# Patient Record
Sex: Female | Born: 1980 | Race: White | Hispanic: No | Marital: Single | Smoking: Never smoker
Health system: Southern US, Community
[De-identification: ages and names within clinical notes are randomized; demographics above are authoritative.]

## PROBLEM LIST (undated history)

## (undated) DIAGNOSIS — F419 Anxiety disorder, unspecified: Secondary | ICD-10-CM

## (undated) DIAGNOSIS — I739 Peripheral vascular disease, unspecified: Secondary | ICD-10-CM

## (undated) DIAGNOSIS — N189 Chronic kidney disease, unspecified: Secondary | ICD-10-CM

## (undated) DIAGNOSIS — E119 Type 2 diabetes mellitus without complications: Secondary | ICD-10-CM

## (undated) DIAGNOSIS — M1990 Unspecified osteoarthritis, unspecified site: Secondary | ICD-10-CM

## (undated) HISTORY — PX: VASCULAR SURGERY: SHX849

---

## 2002-07-05 ENCOUNTER — Emergency Department (HOSPITAL_COMMUNITY): Admission: EM | Admit: 2002-07-05 | Discharge: 2002-07-05 | Payer: Self-pay | Admitting: Emergency Medicine

## 2007-10-26 ENCOUNTER — Ambulatory Visit: Payer: Self-pay | Admitting: Internal Medicine

## 2007-12-12 ENCOUNTER — Ambulatory Visit: Payer: Self-pay | Admitting: Internal Medicine

## 2008-02-06 ENCOUNTER — Ambulatory Visit: Payer: Self-pay | Admitting: Internal Medicine

## 2008-04-29 ENCOUNTER — Ambulatory Visit: Payer: Self-pay | Admitting: Emergency Medicine

## 2008-06-24 ENCOUNTER — Ambulatory Visit: Payer: Self-pay | Admitting: Family Medicine

## 2008-07-10 ENCOUNTER — Ambulatory Visit: Payer: Self-pay | Admitting: Internal Medicine

## 2008-11-07 ENCOUNTER — Ambulatory Visit: Payer: Self-pay | Admitting: Internal Medicine

## 2009-04-05 ENCOUNTER — Ambulatory Visit: Payer: Self-pay | Admitting: Internal Medicine

## 2009-08-08 ENCOUNTER — Ambulatory Visit: Payer: Self-pay | Admitting: Family Medicine

## 2011-01-17 ENCOUNTER — Other Ambulatory Visit: Payer: Self-pay | Admitting: Nephrology

## 2011-01-24 ENCOUNTER — Other Ambulatory Visit: Payer: Self-pay | Admitting: Internal Medicine

## 2011-08-06 ENCOUNTER — Ambulatory Visit: Payer: Self-pay | Admitting: Anesthesiology

## 2011-09-01 ENCOUNTER — Ambulatory Visit: Payer: Self-pay | Admitting: Anesthesiology

## 2011-09-30 ENCOUNTER — Ambulatory Visit: Payer: Self-pay | Admitting: Anesthesiology

## 2011-09-30 LAB — ELECTROLYTE PANEL
Anion Gap: 4 — ABNORMAL LOW (ref 7–16)
Potassium: 2.9 mmol/L — ABNORMAL LOW (ref 3.5–5.1)
Sodium: 135 mmol/L — ABNORMAL LOW (ref 136–145)

## 2011-09-30 LAB — HCG, QUANTITATIVE, PREGNANCY: Beta Hcg, Quant.: <1 m[IU]/mL — ABNORMAL LOW

## 2011-10-01 ENCOUNTER — Ambulatory Visit: Payer: Self-pay | Admitting: Vascular Surgery

## 2012-02-12 ENCOUNTER — Other Ambulatory Visit: Payer: Self-pay | Admitting: Nephrology

## 2012-02-12 LAB — POTASSIUM: Potassium: 6.4 mmol/L — ABNORMAL HIGH (ref 3.5–5.1)

## 2012-02-25 ENCOUNTER — Other Ambulatory Visit: Payer: Self-pay

## 2012-02-25 LAB — POTASSIUM: Potassium: 4.9 mmol/L (ref 3.5–5.1)

## 2012-03-03 ENCOUNTER — Other Ambulatory Visit: Payer: Self-pay

## 2012-03-03 LAB — POTASSIUM: Potassium: 5.5 mmol/L — ABNORMAL HIGH (ref 3.5–5.1)

## 2012-10-20 ENCOUNTER — Ambulatory Visit: Payer: Self-pay | Admitting: Vascular Surgery

## 2013-04-24 ENCOUNTER — Other Ambulatory Visit: Payer: Self-pay | Admitting: Nephrology

## 2013-04-24 LAB — BASIC METABOLIC PANEL
Calcium, Total: 8.8 mg/dL (ref 8.5–10.1)
EGFR (African American): 8 — ABNORMAL LOW
EGFR (Non-African Amer.): 7 — ABNORMAL LOW
Glucose: 171 mg/dL — ABNORMAL HIGH (ref 65–99)
Osmolality: 281 (ref 275–301)

## 2013-10-06 ENCOUNTER — Ambulatory Visit: Payer: Self-pay | Admitting: Family Medicine

## 2013-12-28 ENCOUNTER — Ambulatory Visit: Payer: Self-pay | Admitting: Vascular Surgery

## 2013-12-28 LAB — POTASSIUM: Potassium: 3.9 mmol/L (ref 3.5–5.1)

## 2013-12-28 LAB — HCG, QUANTITATIVE, PREGNANCY: Beta Hcg, Quant.: <1 m[IU]/mL — ABNORMAL LOW

## 2014-07-18 ENCOUNTER — Emergency Department: Payer: Self-pay | Admitting: Emergency Medicine

## 2014-07-18 LAB — COMPREHENSIVE METABOLIC PANEL
ALBUMIN: 3.8 g/dL (ref 3.4–5.0)
ALT: 15 U/L
AST: 7 U/L — AB (ref 15–37)
Alkaline Phosphatase: 79 U/L
Anion Gap: 17 — ABNORMAL HIGH (ref 7–16)
BUN: 95 mg/dL — ABNORMAL HIGH (ref 7–18)
Bilirubin,Total: 0.4 mg/dL (ref 0.2–1.0)
CO2: 15 mmol/L — AB (ref 21–32)
CREATININE: 16.49 mg/dL — AB (ref 0.60–1.30)
Calcium, Total: 8.4 mg/dL — ABNORMAL LOW (ref 8.5–10.1)
Chloride: 102 mmol/L (ref 98–107)
EGFR (Non-African Amer.): 3 — ABNORMAL LOW
GFR CALC AF AMER: 3 — AB
Glucose: 240 mg/dL — ABNORMAL HIGH (ref 65–99)
Osmolality: 306 (ref 275–301)
POTASSIUM: 4.6 mmol/L (ref 3.5–5.1)
Sodium: 134 mmol/L — ABNORMAL LOW (ref 136–145)
TOTAL PROTEIN: 6.9 g/dL (ref 6.4–8.2)

## 2014-07-18 LAB — CBC WITH DIFFERENTIAL/PLATELET
Basophil #: 0.1 10*3/uL (ref 0.0–0.1)
Basophil %: 2.5 %
Eosinophil #: 0.1 10*3/uL (ref 0.0–0.7)
Eosinophil %: 2.2 %
HCT: 25.7 % — AB (ref 35.0–47.0)
HGB: 8.3 g/dL — AB (ref 12.0–16.0)
LYMPHS ABS: 1.3 10*3/uL (ref 1.0–3.6)
Lymphocyte %: 27.8 %
MCH: 33.5 pg (ref 26.0–34.0)
MCHC: 32.2 g/dL (ref 32.0–36.0)
MCV: 104 fL — ABNORMAL HIGH (ref 80–100)
MONOS PCT: 5.4 %
Monocyte #: 0.3 x10 3/mm (ref 0.2–0.9)
Neutrophil #: 2.9 10*3/uL (ref 1.4–6.5)
Neutrophil %: 62.1 %
Platelet: 157 10*3/uL (ref 150–440)
RBC: 2.47 10*6/uL — ABNORMAL LOW (ref 3.80–5.20)
RDW: 13.1 % (ref 11.5–14.5)
WBC: 4.7 10*3/uL (ref 3.6–11.0)

## 2014-09-22 ENCOUNTER — Inpatient Hospital Stay: Payer: Self-pay | Admitting: Internal Medicine

## 2014-09-22 LAB — CBC WITH DIFFERENTIAL/PLATELET
Basophil #: 0.2 10*3/uL — ABNORMAL HIGH (ref 0.0–0.1)
Basophil %: 1.8 %
EOS ABS: 0.2 10*3/uL (ref 0.0–0.7)
Eosinophil %: 1.6 %
HCT: 38.8 % (ref 35.0–47.0)
HGB: 12.2 g/dL (ref 12.0–16.0)
LYMPHS ABS: 0.9 10*3/uL — AB (ref 1.0–3.6)
LYMPHS PCT: 9 %
MCH: 33.9 pg (ref 26.0–34.0)
MCHC: 31.4 g/dL — ABNORMAL LOW (ref 32.0–36.0)
MCV: 108 fL — ABNORMAL HIGH (ref 80–100)
MONO ABS: 0.3 x10 3/mm (ref 0.2–0.9)
Monocyte %: 3.1 %
Neutrophil #: 8.4 10*3/uL — ABNORMAL HIGH (ref 1.4–6.5)
Neutrophil %: 84.5 %
PLATELETS: 193 10*3/uL (ref 150–440)
RBC: 3.61 10*6/uL — AB (ref 3.80–5.20)
RDW: 15.3 % — AB (ref 11.5–14.5)
WBC: 9.9 10*3/uL (ref 3.6–11.0)

## 2014-09-22 LAB — BASIC METABOLIC PANEL
ANION GAP: 25 — AB (ref 7–16)
ANION GAP: 15 (ref 7–16)
BUN: 106 mg/dL — ABNORMAL HIGH (ref 7–18)
BUN: 62 mg/dL — ABNORMAL HIGH (ref 7–18)
CO2: 23 mmol/L (ref 21–32)
CREATININE: 19.52 mg/dL — AB (ref 0.60–1.30)
Calcium, Total: 6.8 mg/dL — CL (ref 8.5–10.1)
Calcium, Total: 7.5 mg/dL — ABNORMAL LOW (ref 8.5–10.1)
Chloride: 98 mmol/L (ref 98–107)
Chloride: 100 mmol/L (ref 98–107)
Co2: 12 mmol/L — ABNORMAL LOW (ref 21–32)
Creatinine: 12.2 mg/dL — ABNORMAL HIGH (ref 0.60–1.30)
EGFR (African American): 5 — ABNORMAL LOW
GFR CALC AF AMER: 3 — AB
GFR CALC NON AF AMER: 2 — AB
GFR CALC NON AF AMER: 4 — AB
GLUCOSE: 364 mg/dL — AB (ref 65–99)
GLUCOSE: 178 mg/dL — AB (ref 65–99)
Osmolality: 318 (ref 275–301)
Osmolality: 298 (ref 275–301)
POTASSIUM: 3.7 mmol/L (ref 3.5–5.1)
Potassium: 5.5 mmol/L — ABNORMAL HIGH (ref 3.5–5.1)
Sodium: 135 mmol/L — ABNORMAL LOW (ref 136–145)
Sodium: 138 mmol/L (ref 136–145)

## 2014-09-22 LAB — COMPREHENSIVE METABOLIC PANEL
ALBUMIN: 4 g/dL (ref 3.4–5.0)
ANION GAP: 23 — AB (ref 7–16)
Alkaline Phosphatase: 110 U/L
BILIRUBIN TOTAL: 0.3 mg/dL (ref 0.2–1.0)
BUN: 113 mg/dL — ABNORMAL HIGH (ref 7–18)
CALCIUM: 6.2 mg/dL — AB (ref 8.5–10.1)
Chloride: 98 mmol/L (ref 98–107)
Co2: 9 mmol/L — CL (ref 21–32)
Creatinine: 19.28 mg/dL — ABNORMAL HIGH (ref 0.60–1.30)
EGFR (African American): 3 — ABNORMAL LOW
EGFR (Non-African Amer.): 2 — ABNORMAL LOW
GLUCOSE: 337 mg/dL — AB (ref 65–99)
Osmolality: 310 (ref 275–301)
POTASSIUM: 7 mmol/L — AB (ref 3.5–5.1)
SGOT(AST): 6 U/L — ABNORMAL LOW (ref 15–37)
SGPT (ALT): 17 U/L
Sodium: 130 mmol/L — ABNORMAL LOW (ref 136–145)
Total Protein: 7.3 g/dL (ref 6.4–8.2)

## 2014-09-22 LAB — PHOSPHORUS: PHOSPHORUS: 13.5 mg/dL — AB (ref 2.5–4.9)

## 2014-09-23 LAB — BASIC METABOLIC PANEL
Anion Gap: 15 (ref 7–16)
BUN: 63 mg/dL — AB (ref 7–18)
CHLORIDE: 99 mmol/L (ref 98–107)
CO2: 24 mmol/L (ref 21–32)
CREATININE: 12.87 mg/dL — AB (ref 0.60–1.30)
Calcium, Total: 7.2 mg/dL — ABNORMAL LOW (ref 8.5–10.1)
EGFR (Non-African Amer.): 4 — ABNORMAL LOW
GFR CALC AF AMER: 4 — AB
Glucose: 151 mg/dL — ABNORMAL HIGH (ref 65–99)
Osmolality: 297 (ref 275–301)
POTASSIUM: 4.5 mmol/L (ref 3.5–5.1)
SODIUM: 138 mmol/L (ref 136–145)

## 2014-09-23 LAB — CBC WITH DIFFERENTIAL/PLATELET
Basophil #: 0 10*3/uL (ref 0.0–0.1)
Basophil %: 0.8 %
EOS ABS: 0.1 10*3/uL (ref 0.0–0.7)
Eosinophil %: 2.2 %
HCT: 39.2 % (ref 35.0–47.0)
HGB: 12.6 g/dL (ref 12.0–16.0)
LYMPHS ABS: 1 10*3/uL (ref 1.0–3.6)
Lymphocyte %: 16.5 %
MCH: 33.6 pg (ref 26.0–34.0)
MCHC: 32.2 g/dL (ref 32.0–36.0)
MCV: 104 fL — AB (ref 80–100)
Monocyte #: 0.5 x10 3/mm (ref 0.2–0.9)
Monocyte %: 8.2 %
Neutrophil #: 4.4 10*3/uL (ref 1.4–6.5)
Neutrophil %: 72.3 %
PLATELETS: 185 10*3/uL (ref 150–440)
RBC: 3.76 10*6/uL — ABNORMAL LOW (ref 3.80–5.20)
RDW: 14.7 % — AB (ref 11.5–14.5)
WBC: 6 10*3/uL (ref 3.6–11.0)

## 2014-09-23 LAB — PHOSPHORUS: Phosphorus: 8.9 mg/dL — ABNORMAL HIGH (ref 2.5–4.9)

## 2014-09-24 LAB — BASIC METABOLIC PANEL
Anion Gap: 13 (ref 7–16)
BUN: 29 mg/dL — AB (ref 7–18)
CALCIUM: 7.7 mg/dL — AB (ref 8.5–10.1)
CREATININE: 8.07 mg/dL — AB (ref 0.60–1.30)
Chloride: 95 mmol/L — ABNORMAL LOW (ref 98–107)
Co2: 27 mmol/L (ref 21–32)
EGFR (African American): 7 — ABNORMAL LOW
EGFR (Non-African Amer.): 6 — ABNORMAL LOW
GLUCOSE: 243 mg/dL — AB (ref 65–99)
Osmolality: 284 (ref 275–301)
Potassium: 4.1 mmol/L (ref 3.5–5.1)
SODIUM: 135 mmol/L — AB (ref 136–145)

## 2014-09-24 LAB — CBC WITH DIFFERENTIAL/PLATELET
Basophil #: 0.1 10*3/uL (ref 0.0–0.1)
Basophil %: 1 %
EOS ABS: 0.1 10*3/uL (ref 0.0–0.7)
Eosinophil %: 2.4 %
HCT: 39.7 % (ref 35.0–47.0)
HGB: 12.8 g/dL (ref 12.0–16.0)
LYMPHS PCT: 25.2 %
Lymphocyte #: 1.5 10*3/uL (ref 1.0–3.6)
MCH: 33.5 pg (ref 26.0–34.0)
MCHC: 32.3 g/dL (ref 32.0–36.0)
MCV: 104 fL — AB (ref 80–100)
Monocyte #: 0.8 x10 3/mm (ref 0.2–0.9)
Monocyte %: 12.4 %
NEUTROS PCT: 59 %
Neutrophil #: 3.6 10*3/uL (ref 1.4–6.5)
Platelet: 176 10*3/uL (ref 150–440)
RBC: 3.82 10*6/uL (ref 3.80–5.20)
RDW: 14.9 % — AB (ref 11.5–14.5)
WBC: 6.2 10*3/uL (ref 3.6–11.0)

## 2015-01-16 ENCOUNTER — Other Ambulatory Visit: Payer: Self-pay | Admitting: Vascular Surgery

## 2015-01-16 DIAGNOSIS — N186 End stage renal disease: Secondary | ICD-10-CM

## 2015-01-18 NOTE — Op Note (Signed)
PATIENT NAME:  [name], [name] MR#:  [id number] DATE OF BIRTH:  01/08/1981  DATE OF PROCEDURE:  10/20/2012  PREOPERATIVE DIAGNOSES: 1. End-stage renal disease.  2. Face and neck swelling worrisome for central venous occlusion with right arm AV fistula.  3. Diabetes mellitus.   POSTOPERATIVE DIAGNOSES: 1. End-stage renal disease.  2. Face and neck swelling worrisome for central venous occlusion with right arm AV fistula.  3. Diabetes mellitus.   PROCEDURES PERFORMED: 1. Ultrasound guidance for vascular access to right radiocephalic AV fistula.  2. Right upper extremity fistulogram and central venogram.   SURGEON: Annice Needy, MD  ANESTHESIA: Local with moderate conscious sedation.   ESTIMATED BLOOD LOSS: Minimal.   FLUOROSCOPY TIME: Less than 2 minutes.   CONTRAST USED: 25 mL.   INDICATION FOR PROCEDURE: This is a 34 year old female with end-stage renal disease. Her fistula has worked reasonably well, but she has developed worsening face and head swelling over the past several months. She was catheter-dependent for some time and imaging is performed to evaluate for central venous stenosis or occlusion as well as to evaluate the remainder of the fistula.   DESCRIPTION OF PROCEDURE: The right upper extremity was sterilely prepped and draped and a sterile surgical field was created. The fistula was accessed a few centimeters beyond the anastomosis under direct ultrasound guidance without difficulty with a micropuncture needle and micropuncture wire and sheath were placed. Imaging was performed through the micropuncture sheath. This demonstrated patent fistula anastomosis with compression of the fistula. The remainder of the fistula demonstrated good flow. There was essentially split flow in the mid to upper forearm through the cephalic vein, in a large branch, both of which were patent with good flow and I did not have any difficulty with access or flow rates so the smaller of the two  branches was not embolized or treated today. These were rejoined just above the antecubital fossa. She also had outflow through the deep venous system, in the upper arm. The right subclavian vein, innominate vein and superior vena cava appeared widely patent with good brisk flow present and no central venous occlusion was identified. At this point, there was no role for intervention. I elected to terminate the procedure. The sheath was removed around a 4-0 Monocryl pursestring suture, pressure was held and sterile dressing was placed. The patient tolerated the procedure well and was taken to the recovery room in stable condition.  ____________________________ Annice Needy, MD jsd:sb D: 10/20/2012 12:27:34 ET T: 10/20/2012 12:39:11 ET JOB#: [id number]  cc: Annice Needy, MD, <Dictator> Molli Barrows, MD Annice Needy MD ELECTRONICALLY SIGNED 10/24/2012 9:12

## 2015-01-19 NOTE — Consult Note (Signed)
Brief Consult Note: Comments: Psychiatry: Consult received. PAtient appears to have left AMA.  Electronic Signatures: Clapacs, Jackquline Denmark (MD)  (Signed 28-Dec-15 16:08)  Authored: Brief Consult Note   Last Updated: 28-Dec-15 16:08 by Audery Amel (MD)

## 2015-01-19 NOTE — Op Note (Signed)
PATIENT NAME:  [name], [name] MR#:  [id number] DATE OF BIRTH:  02-25-1981  DATE OF PROCEDURE:  12/28/2013  PREOPERATIVE DIAGNOSES:  1.  End-stage renal disease.  2.  Diabetes.  3.  Poor function of right arteriovenous fistula.   POSTOPERATIVE DIAGNOSES:  1.  End-stage renal disease.  2.  Diabetes.  3.  Poor function of right arteriovenous fistula.   PROCEDURE: 1.  Ultrasound guidance for vascular access, right radiocephalic AV fistula.  2.  Right extremity fistulogram and central venogram.  3.  Percutaneous transluminal angioplasty of right mid forearm cephalic vein with 7 mm diameter angioplasty balloon.  4.  A 6 mm Viabahn stent post dilated with a 7 mm balloon for greater than 50% residual stenosis and thrombus after angioplasty.    SURGEON: Annice Needy, M.D.   ANESTHESIA: Local with moderate conscious sedation.   ESTIMATED BLOOD LOSS: 25 mL   CONTRAST USED:  45 mL Visipaque.   INDICATION FOR PROCEDURE: A 34 year old white female who has been on dialysis for several years. Her right radiocephalic AV fistula has now developed poor flow and noninvasive study showed a short segment occlusion in the mid forearm cephalic vein and is now being drained by collaterals. We are attempting to salvage this today. Risks and benefits were discussed. Informed consent was obtained.   DESCRIPTION OF PROCEDURE: The patient is brought to the vascular suite. The right upper extremity was sterilely prepped and draped and a sterile surgical field was created. The fistula was accessed with a mild amount of difficulty. Just beyond the arterial anastomosis with a micropuncture needle, micropuncture wire and sheath were then placed. We upsized to a 6-French sheath. Imaging did show a short segment occlusion with drainage through a collateral. The upper arm cephalic vein was normal and central venous circulation was normal. The patient was heparinized. A stiff angled Glidewire and Kumpe catheter used to  cross the lesion without difficulty and I confirmed intraluminal flow in the cephalic vein just below the antecubital fossa. Then replaced a Magic Torque wire. I treated this area with a 7 mm diameter angioplasty balloon which resulted in improvement of flow but about a 70% to 80% residual flow-limiting thrombus was seen. I elected to cover this area with a 6 mm diameter x 2.5 cm with a Viabahn stent post dilated with 7 mm balloon with an excellent angiographic completion result and no hemodynamically significant residual stenosis. At this point, I elected to terminate the procedure. The sheath was removed, 4-0 Monocryl purse suture was placed, and pressure was held. Sterile dressing was placed. The patient tolerated the procedure well and was taken to the recovery room in stable condition.    ____________________________ Annice Needy, MD jsd:tc D: 12/28/2013 15:06:20 ET T: 12/28/2013 18:54:37 ET JOB#: [id number]  cc: Annice Needy, MD, <Dictator> Annice Needy MD ELECTRONICALLY SIGNED 01/01/2014 9:52

## 2015-01-19 NOTE — Consult Note (Signed)
PATIENT NAME:  [name], [name] MR#:  [id number] DATE OF BIRTH:  [birth year]  DATE OF CONSULTATION:  09/22/2014   CONSULTING PHYSICIAN:  Scot Jun, MD  HISTORY OF PRESENT ILLNESS:  The patient is a 34 year old white female who has had severe medical problems. She has end stage kidney disease and is on dialysis. She has had diabetes for 20 years since age 44. She has not had dialysis in over a week and she comes in with severe metabolic disorders.   The patient says she has been vomiting 3 times a day for the last week. She notices a black material in her vomit.  She does not take iron pills. She does, however take ibuprofen.  Her last endoscopy was 2 years ago.  She has had 2 or 3 endoscopies and 3 or 4 colonoscopies.  She has significant diarrhea, especially after eating. She will have diarrhea and have multiple bowel movements to the point of the draining everything out.  She was recently started on the octreotide. She has had 2 shots.  She is overdue now for a third shot in about three weeks.   The patient has multiple joints causing pain in her back, hips and legs, and she takes ibuprofen for that.  She has been told that she may have gastroparesis.  She has been on Reglan in the past, but not now. She has not had menstrual periods for years, and she is on no birth control.  She tends to run a low blood pressure.   REVIEW OF SYSTEMS:  No known heart disease. No known respiratory disease, asthma, wheezing, emphysema.  She has never had a stroke, does not smoke, does not use street drugs. She has been vomiting 3 times a day.   MEDICAL PROBLEMS: Include eating disorder, depression, anorexia, hypothyroidism, irritable bowel syndrome, anemia of chronic kidney disease, secondary hyperparathyroidism. She has a right upper extremity AV fistula.   She dialyzes Monday, Wednesday, and Friday. Followed at the [facility].   ALLERGIES: No known drug allergies.   MEDICATIONS AT HOME:   Include sodium bicarbonate 1300 mg p.o. b.i.d., Renvela 800 mg p.o. t.i.d., Ropinirole 1 tablet p.o. t.i.d., promethazine 25 mg p.o. every 6 hours p.r.n. Pancrelipase 25,000 units 4 capsules with food 3 times a day, Zofran 4 mg every 6 hours p.r.n., multivitamins 1 tablet a day, midodrine 5 mg t.i.d., Levemir insulin dose uncertain, hyoscyamine 0.125 mg daily, sliding scale insulin glucagon 1 mg IM for a very low blood sugar, Fosrenol  1000 mg p.o. t.i.d., fludrocortisone 1 tablet p.o. b.i.d., Colestipol 1 gram q.i.d., dronabinol 2.5 mg p.o. t.i.d.   PHYSICAL EXAMINATION:  GENERAL: Young white female looks to be feeling bad.  VITAL SIGNS: Temperature 97.8, pulse 102, respirations 10, blood pressure 138/94, pulse oximetry 96% on room air.  The patient has multicolored hair. She said it was 10 different colors.  HEENT: Sclerae nonicteric. Conjunctivae negative. Tongue negative.  HEAD: Atraumatic.  NECK: Trachea in the midline.  CHEST: Clear.  HEART: No murmurs or gallops that I can hear.  ABDOMEN: Bowel sounds are significantly diminished. No palpable organomegaly. No significant tenderness.  EXTREMITIES: Dialysis shunt in the right arm   LABORATORY DATA: Glucose 409, BUN 113, creatinine 19.3, sodium 130, potassium 7, chloride 98, CO2 of 9, calcium 6.2, total protein 7.3, albumin 4, bilirubin 0.3, alkaline phosphatase 110, SGOT 6, SGPT 17. White count is 9.9, hemoglobin 12.2, platelet count 193,000, MCV 103, pH 7.09, pO2 of 110, bicarbonate of 10.  ASSESSMENT: The patient's gastrointestinal problem probably is repetitive vomiting, producing mucosal damage which causes repetitive vomiting, which causes mucosal damage.  Also her diabetes may play a role in gastroparesis and also may be the cause for her diarrhea creating a secretory diarrhea of significant proportions. To treat her stomach, I would recommend around the clock 4 mg Zofran every hours.  I would give IV Protonix b.i.d. and Phenergan as a  back up to the Protonix.  I would give her ice chips only at this time and slowly expand the clear liquids as tolerated.  Dr. Bluford Kaufmann will be on call tomorrow and round on her. 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       ____________________________ Scot Jun, MD rte:DT D: 09/22/2014 15:53:18 ET T: 09/22/2014 16:10:52 ET JOB#: [id number]  cc: Scot Jun, MD, <Dictator> Munsoor Lizabeth Leyden, MD Katha Hamming, MD  Scot Jun MD ELECTRONICALLY SIGNED 09/26/2014 13:52

## 2015-01-19 NOTE — Consult Note (Signed)
Chief Complaint:  Subjective/Chief Complaint Covering for Dr. Mechele Collin. Pt continues to vomit with coffee ground emesis although patient on zofran RTC and reglan IV and protonix IV. Hgb remains stable. According to family, no previous hematemesis when she had EGD's done in the past at [facility]. Recalls being on reglan before without much relief.   VITAL SIGNS/ANCILLARY NOTES: **Vital Signs.:   27-Dec-15 12:00  Vital Signs Type Routine   Brief Assessment:  GEN Appears sick   Cardiac Regular   Respiratory clear BS   Gastrointestinal tender in epigastrum   Assessment/Plan:  Assessment/Plan:  Assessment Poorly controlled diabetes. Renal failure. Possible gastroparesis. UGI bleeding.   Plan Continue reglan/protonix/zofran. If no significant relief soon, consider repeating EGD. Will discuss with Dr. Mechele Collin.   Electronic Signatures: Lutricia Feil (MD)  (Signed 27-Dec-15 11:05)  Authored: Chief Complaint, VITAL SIGNS/ANCILLARY NOTES, Brief Assessment, Assessment/Plan   Last Updated: 27-Dec-15 11:05 by Lutricia Feil (MD)

## 2015-01-23 NOTE — Discharge Summary (Signed)
PATIENT NAME:  [name], Wu MR#:  [id number] DATE OF BIRTH:  11/23/1980  DATE OF ADMISSION:  09/22/2014 DATE OF DISCHARGE:  09/24/2014  ADMITTING PHYSICIAN: Zenda Alpers, MD   The patient left AMA on the early hours of 09/24/2014 before seen by a physician.   PRIMARY CARE PHYSICIAN: [name] Housekeeper, MD  PRIMARY NEPHROLOGIST: [facility].   CONSULTATIONS IN THE HOSPITAL:  1.  GI consultation by Scot Jun, MD 2.  Nephrology consultation with Daiva Nakayama, MD.    FINAL DIAGNOSES:  1.  Acute on chronic severe gastroparesis with nausea, vomiting.  2.  Diabetic ketoacidosis with hypoglycemic episodes.  3.  Type 1 diabetes mellitus.  4.  End-stage renal disease, on hemodialysis.  5.  Hematemesis from repeated vomiting.  6.  Depression. 7.  Severe metabolic acidosis.  LABORATORIES AND IMAGING STUDIES PRIOR TO DISCHARGE: WBC 6.0, hemoglobin 12.6, hematocrit 39.2, platelet count 185,000.   Sodium 138, potassium 4.5, chloride 99, bicarbonate 24, BUN 63, creatinine 12.87, glucose 151, calcium of 7.2. Creatinine on admission was 19.28 with potassium of 7.0, calcium of 6.1, bicarbonate of 9.   BRIEF HOSPITAL COURSE: [name] is a 34 year old young Caucasian female with past medical history significant for type 1 diabetes mellitus with gastroparesis, chronic nausea and vomiting, history of end-stage renal disease on hemodialysis, who presents to the hospital secondary to worsening nausea, vomiting and diarrhea.  1.  Acute on chronic nausea and vomiting from missing dialysis for almost 8 days, probably uremia causing most of symptoms. She has poor p.o. intake anyways from her chronic nausea and vomiting. She also developed hematemesis after multiple vomiting episodes, could be Mallory-Weiss versus superficial capillary rupture from repeated vomiting. However, hematemesis has resolved. She was put on Protonix infusion, seen by GI. Hemoglobin was monitored, no  significant drop, no need for any transfusion in the hospital. She was on Reglan, Zofran and Phenergan here in the hospital. Once her symptoms have improved, the patient has actually left against medical advice and said she will follow up for outpatient dialysis.  2.  End-stage renal disease with metabolic acidosis and hyperkalemia, missing hemodialysis. Received dialysis on December 26 as well as 27th. Seen by nephrology. Creatinine has improved from 19 to 12 with potassium improving up to 4.1. Seen by nephrology in the hospital. Again, the patient left AMA.   ADDITIONAL TIME SPENT: Thirty-five minutes.    ____________________________ Enid Baas, MD rk:TT D: 09/25/2014 14:05:00 ET T: 09/25/2014 15:33:28 ET JOB#: [id number]  cc: Enid Baas, MD, <Dictator> Enid Baas MD ELECTRONICALLY SIGNED 10/15/2014 16:13

## 2015-01-23 NOTE — Consult Note (Signed)
PATIENT NAME:  [name], ROCCHIO MR#:  [id number] DATE OF BIRTH:  [birth year]  DATE OF CONSULTATION:  09/22/2014  REFERRING PHYSICIAN:  Katha Hamming, MD CONSULTING PHYSICIAN:  Ysenia Filice Lizabeth Leyden, MD  REASON FOR CONSULTATION: Evaluation and management of end-stage renal disease on a hemodialysis patient with severe hyperkalemia and metabolic acidosis.   HISTORY OF PRESENT ILLNESS: The patient is a very pleasant 34 year old Caucasian female with a past medical history of end-stage renal disease on hemodialysis at [facility], followed by [facility], history of eating disorder, depression, anorexia, hypothyroidism, irritable bowel syndrome, anemia of chronic kidney disease, secondary hyperparathyroidism, who presented to [facility] with nausea, vomiting, and not having had dialysis in over 1 week. The patient reports to me that she has been having ongoing nausea and vomiting for at least the past week. She has had some episodes of diarrhea; however, she has known underlying irritable bowel syndrome, which has been diarrhea dominant in the past. She has not gone to dialysis since last Friday. She has severe metabolic derangements upon presentation. BUN was noted as being 113 with a creatinine of 19.2. Potassium was high at 7.0 and serum bicarbonate was quite low at 9. ABG was performed, which showed a pH of 7.09, pCO2 of 33. She was admitted to the critical care unit and placed on an insulin drip. We have been asked to see her for reinitiating dialysis.   PAST MEDICAL HISTORY:  1.  End-stage renal disease on hemodialysis Monday/Wednesday/ Friday, followed at the [facility] and followed by [facility].  2.  Anemia of chronic kidney disease.  3.  Secondary hyperparathyroidism.  4.  Hypertension.  5.  Irritable bowel syndrome, diarrhea predominant.  6.  Hypothyroidism.  7.  Anorexia.  8.  History of eating disorder.  9.  Diabetes melitis type  1.  10.  Right upper extremity AV fistula.   ALLERGIES: No known drug allergies.   HOME MEDICATIONS INCLUDE:  1.  Sodium bicarbonate 1300 mg p.o. b.i.d.  2.  Renvela 800 mg p.o. t.i.d.  3.  Ropinirole 1 tablet p.o. t.i.d.  4.  Promethazine 25 mg p.o. every 6 hours p.r.n.  5.  Pancrelipase 25,000 units 4 capsules p.o. t.i.d.  6.  Zofran 4 mg p.o. every 6 hours p.r.n.  7.  Multivitamin 1 tablet p.o. daily.  8.  Midodrine 5 mg p.o. t.i.d.  9.  Levemir insulin.  10.  Hyoscyamine 0.125 mg p.o. daily.  11.  Sliding scale insulin.  12.  Glucagon 1 mg intramuscular as needed for a low blood sugar.  13.  Fosrenol 1000 mg p.o. t.i.d.  14.  Fludrocortisone 1 tablet p.o. b.i.d.  15.  Colestipol 1 g p.o. q.i.d.  16.  Dronabinol 2.5 mg p.o. t.i.d.   SOCIAL HISTORY: The patient resides with her parents in [place]. She has no children. She is on disability. She denies tobacco, alcohol, or illicit drug use.   FAMILY HISTORY: The patient reports her mother is alive and well. She is unaware of any medical issues with her mother.   REVIEW OF SYSTEMS:  CONSTITUTIONAL: The patient denies fevers, chills, or recent weight loss. She actually reports some recent weight gain.  EYES: Denies diplopia or blurry vision.  HEENT: Denies headaches or hearing loss. Denies epistaxis.  CARDIOVASCULAR: Currently denies chest pain or palpitations.  RESPIRATORY: Denies cough, shortness of breath, or hemoptysis.  GASTROINTESTINAL: Endorses nausea, vomiting, diarrhea and has history of irritable bowel syndrome.  GENITOURINARY: Denies  frequency, urgency, or dysuria.  MUSCULOSKELETAL: Denies joint pain, swelling or redness.  INTEGUMENTARY: Denies skin rashes or lesions.  NEUROLOGIC: Denies focal numbness or weakness. Has history of restless leg syndrome.  PSYCHIATRIC: Has history of depression.  ENDOCRINE: Denies polyuria or polydipsia. Has history of diabetes mellitus type 1.  HEMATOLOGIC AND LYMPHATIC: Denies  easy bruisability, bleeding or swollen lymph nodes.  ALLERGY AND IMMUNOLOGIC: Denies seasonal allergies or history of immunodeficiency.   PHYSICAL EXAMINATION:  VITAL SIGNS: Temperature 97.8, pulse 102, respirations 10, blood pressure 138/94.  GENERAL: A well-developed, well-nourished Caucasian female who appears her stated age, currently in no acute distress.  HEENT: Normocephalic, atraumatic. Extraocular movements are intact. Pupils equal and reactive to light. No scleral icterus. Conjunctivae are pink. No epistaxis noted. Gross hearing intact. Oral mucosa moist.  NECK: Supple and without JVD or lymphadenopathy.  LUNGS: Clear to auscultation bilaterally, with normal respiratory effort.  HEART: S1, S2. Regular rate and rhythm. No murmurs, rubs, or gallops appreciated.  ABDOMEN: Soft, nontender, nondistended. Bowel sounds positive. No rebound or guarding. No gross organomegaly appreciated.  EXTREMITIES: No clubbing, cyanosis, or edema. NEUROLOGIC: The patient is awake, alert, and oriented to time, person, and place. Strength is 5/5 in both upper and lower extremities. Sensation is intact.  GENITOURINARY: No suprapubic tenderness is noted at this time.  SKIN: Warm and dry. No rashes noted.  MUSCULOSKELETAL: No joint redness, swelling or tenderness appreciated.  PSYCHIATRIC: The patient with an appropriate affect, and appears to have good insight into her current illness.   LABORATORY DATA: Sodium 130, potassium 7.0, chloride 98, CO2 of 29, BUN 113, creatinine 19.2, glucose 337, total protein 7.3, albumin 4.0, total bilirubin 0.3, alkaline phosphatase 110, AST 6, ALT 17. CBC shows WBC 9.9, hemoglobin 12.2, hematocrit 38, platelets 193,000. ABG shows pH of 7.09, pCO2 of 33, pO2 of 110.   IMPRESSION: This is a 34 year old Caucasian female with a past medical history of end-stage renal disease on hemodialysis Monday/Wednesday/Friday, followed at [facility], and followed by [facility]; hypothyroidism, irritable bowel syndrome with diarrhea symptoms prominent, anorexia, depression, diabetes mellitus type 1, anemia of chronic kidney disease, secondary hyperparathyroidism, right upper extremity AV fistula, who presented to [facility] with nausea, vomiting and having missed dialysis for approximately 8 days.  1.  End-stage renal disease on hemodialysis Monday/Wednesday/ Friday. The patient is clearly in need of hemodialysis at present, given hyperkalemia at present. Therefore, we will start the patient on dialysis today. However, given the long duration without dialysis, we will need to be gentle with her dialysis treatment, and therefore, the patient will undergo dialysis for 2 hours with a blood flow rate of 200 and dialysate flow rate of 400. No ultrafiltration to be performed at present. We will likely start the patient on dialysis tomorrow as well.  2.  Severe hyperkalemia. Serum potassium noted as being 7.0. Acidosis contributing to this. As she receives insulin drip, much of the potassium will be shifted to the intracellular space. We will dialyze the patient against a 1K bath today.  3.  Anemia of chronic kidney disease. Hemoglobin is actually high now, but she may be volume contracted. We will hold off on administering Epogen at present and follow CBC.  4.  Secondary hyperparathyroidism. Hold off on administering binders for now. Check parathyroid hormone as well as phosphorus today.  5.  Diabetic ketoacidosis. The patient is currently on insulin drip. We would recommend monitoring of serum electrolytes.   I would like  to thank Dr. Luberta Mutter for this kind referral.    ____________________________ Lennox Pippins, MD mnl:MT D: 09/22/2014 14:59:12 ET T: 09/22/2014 15:18:05 ET JOB#: [id number]  cc: Lennox Pippins, MD, <Dictator> Ria Comment Zymeir Salminen MD ELECTRONICALLY SIGNED 10/18/2014 9:40

## 2015-01-23 NOTE — H&P (Signed)
PATIENT NAME:  [name], [name] MR#:  [id number] DATE OF BIRTH:  [birth year]  DATE OF ADMISSION:  09/22/2014  FAMILY PHYSICIAN:  [name] Housekeeper, MD  CHIEF COMPLAINT: Nausea, vomiting, diarrhea.   HISTORY OF PRESENT ILLNESS: This patient is a 34 year old female patient with history of type 2 diabetes mellitus, hypertension, ESRD on hemodialysis, comes in because of vomiting for 3 days associated with coffee-ground vomiting since last night. The patient also has generalized abdominal pain for 3 days. She missed hemodialysis, 3 sessions, because of her nausea, vomiting, diarrhea.  She comes in because of those complaints, found to have severe hyperkalemia with potassium of 7 and bicarbonate of 9.  The patient was found to be in diabetic ketoacidosis.  She received a dose of bicarbonate IV push along with insulin and dextrose and I spoke with Dr. Mady Haagensen to arrange for emergency hemodialysis.  She has diarrhea for 11 years and according to the husband she had all the work-up at [facility] and the work-up has been negative and they could not find the reason for her diarrhea.  The patient was noted to have coffee-ground vomiting since last night.   PAST MEDICAL HISTORY: Significant for type 2 diabetes mellitus and history of ESRD on hemodialysis for the past 3 years and history of low blood pressure.  ALLERGIES: No known allergies.   SOCIAL HISTORY: The patient does not smoke or drink and lives with husband.   FAMILY HISTORY: No hypertension or diabetes.   PAST SURGICAL HISTORY: Significant for dialysis shunt placement.  MEDICATIONS:  Percocet 5/325 once a day as needed for pain, Colestipol 1 gram p.o. 4 times a day as needed, fludrocortisone 0.1 mg p.o. b.i.d.  ., Humalog or Quick pen as needed.  Levemir 2 units subcutaneous b.i.d., Midodrine 5 mg p.o. t.i.d. Zofran 4 mg every 6 to 8 hours as needed. Pancrelipase 4 capsules t.i.d., Requip 1 mg p.o. t.i.d., sodium bicarbonate 650 mg 2 tablets  p.o. b.i.d., Sevelamer 800 mg p.o. t.i.d.,, CONSTITUTIONAL: The patient feels tired with diffuse body aches.  EYES: No blurred vision.  ENT: No tinnitus. No ear pain. No epistaxis. No difficulty swallowing.  RESPIRATORY: No cough. No wheezing.  CARDIOVASCULAR: No chest pain, No orthopnea, does have a facial edema. No palpitations.  GASTROINTESTINAL: Does have nausea or vomiting and coffee-ground vomiting since last night and diarrhea. Multiple episodes and diarrhea is chronic and going on for 11 years.  GENITOURINARY: She is on dialysis.  ENDOCRINE: The patient has diabetes since she has been 34 years old.  HEMATOLOGIC: No anemia.  INTEGUMENTARY: No skin rashes.  MUSCULOSKELETAL: No joint pain.  NEUROLOGIC: No numbness or weakness or dysarthria. PSYCHIATRIC:  No anxiety or insomnia.   PHYSICAL EXAMINATION:  VITAL SIGNS: Temporary 97.8, heart rate 102, blood pressure 138/95, sats 98%  on room air. GENERAL:  Alert, awake and oriented 34 year old female in slight distress because of generalized body pains.  HEAD: Atraumatic, normocephalic.  EYES: Pupils equally reacting to light. Extraocular movements are intact. No conjunctivitis. Hearing is intact.  ENT: No tympanic membrane congestion. Mucosae is dry and dentition is good.  NECK: Thyroid is not enlarged. No JVD. No carotid bruit. Normal range of motion, no lymphadenopathy.  RESPIRATORY: Bilaterally clear to auscultation. No wheeze. No rales. Not using accessory muscles of respiration.  CARDIOVASCULAR: S1, S2 regular tachycardic.  PMI not displaced.  Have good pedal pulse, femoral pulse, does not have pedal edema.  ABDOMEN: Generalized abdominal tenderness present. No rebound tenderness. No organomegaly. No  hernias.  MUSCULOSKELETAL: Strength 5/5 in upper and lower extremities. dtr 2+  . PSYCHIATRIC: Motor and affect are within normal limits.   LABORATORY DATA: WBC 9.9, hemoglobin 12.2, hematocrit 38.8, platelets 193,000.     ELECTROLYTES: Sodium is 130, potassium 7, chloride 98, bicarb 9, BUN is 113, creatinine 19.28, glucose 337 and anion gap is 23.  EKG shows no tall T-waves with normal sinus, 89 beats per minute.  Chest x-ray is not done.  I ordered an ABG as well.   ASSESSMENT AND PLAN:   1.  The patient is a 34 year old female patient with severe hyperkalemia secondary to missed hemodialysis sessions. Admit her to Intensive Care Unit. She received insulin with dextrose, Kayexalate and bicarbonate push.  I spoke with Dr.Munsoor Lateef <<e and we are going to arrange for emergency hemodialysis. 2.  Gastrointestinal bleed with nausea, vomiting and coffee-ground vomiting, started on Protonix drip. She is stable hemodynamically but I would get a gastrointestinal consult. The patient says that she takes sometimes ibuprofen, so we will with await consultation, start the Protonix drip.  3.  Severe metabolic acidosis with diabetic ketoacidosis and anion gap of 23, started on insulin drip and started on bicarbonate drip.  Check Chem-7 every 6 hours and see how she does. Marland Kitchen   TIME SPENT: More than 55 minutes on critical care.     ____________________________ Katha Hamming, MD sk:DT D: 09/22/2014 14:18:27 ET T: 09/22/2014 15:04:18 ET JOB#: [id number]  cc: Katha Hamming, MD, <Dictator> [name] Housekeeper, MD Katha Hamming MD ELECTRONICALLY SIGNED 10/19/2014 21:17

## 2015-01-28 ENCOUNTER — Ambulatory Visit: Admission: RE | Admit: 2015-01-28 | Payer: Self-pay | Source: Ambulatory Visit

## 2015-02-14 ENCOUNTER — Encounter
Admission: RE | Disposition: A | Discharge status: Home / Self Care | Payer: Medicare Other | Source: Ambulatory Visit | Attending: Vascular Surgery

## 2015-02-14 ENCOUNTER — Encounter: Payer: Self-pay | Admitting: *Deleted

## 2015-02-14 ENCOUNTER — Ambulatory Visit
Admission: RE | Admit: 2015-02-14 | Discharge: 2015-02-14 | Disposition: A | Discharge status: Home / Self Care | Payer: Medicare Other | Source: Ambulatory Visit | Attending: Vascular Surgery | Admitting: Vascular Surgery

## 2015-02-14 DIAGNOSIS — Z992 Dependence on renal dialysis: Secondary | ICD-10-CM | POA: Insufficient documentation

## 2015-02-14 DIAGNOSIS — Z79899 Other long term (current) drug therapy: Secondary | ICD-10-CM | POA: Insufficient documentation

## 2015-02-14 DIAGNOSIS — T82858A Stenosis of vascular prosthetic devices, implants and grafts, initial encounter: Secondary | ICD-10-CM | POA: Diagnosis present

## 2015-02-14 DIAGNOSIS — R634 Abnormal weight loss: Secondary | ICD-10-CM | POA: Insufficient documentation

## 2015-02-14 DIAGNOSIS — M1990 Unspecified osteoarthritis, unspecified site: Secondary | ICD-10-CM | POA: Insufficient documentation

## 2015-02-14 DIAGNOSIS — N186 End stage renal disease: Secondary | ICD-10-CM | POA: Diagnosis not present

## 2015-02-14 HISTORY — PX: PERIPHERAL VASCULAR CATHETERIZATION: SHX172C

## 2015-02-14 HISTORY — DX: Unspecified osteoarthritis, unspecified site: M19.90

## 2015-02-14 SURGERY — A/V SHUNTOGRAM/FISTULAGRAM
Anesthesia: Moderate Sedation

## 2015-02-14 MED ORDER — SODIUM CHLORIDE 0.9 % IV SOLN: INTRAVENOUS | Status: DC

## 2015-02-14 MED ORDER — HEPARIN SODIUM (PORCINE) 1000 UNIT/ML IJ SOLN
INTRAMUSCULAR | Status: AC
  Filled 2015-02-14: qty 1

## 2015-02-14 MED ORDER — MIDAZOLAM HCL 2 MG/2ML IJ SOLN
INTRAMUSCULAR | Status: DC | PRN
  Administered 2015-02-14: 2 mg via INTRAVENOUS
  Administered 2015-02-14: 1 mg via INTRAVENOUS
  Administered 2015-02-14: 2 mg via INTRAVENOUS

## 2015-02-14 MED ORDER — MIDAZOLAM HCL 5 MG/5ML IJ SOLN
INTRAMUSCULAR | Status: AC
  Filled 2015-02-14: qty 5

## 2015-02-14 MED ORDER — ONDANSETRON HCL 4 MG/2ML IJ SOLN: 4.0000 mg | INTRAMUSCULAR | Status: DC | PRN

## 2015-02-14 MED ORDER — LIDOCAINE-EPINEPHRINE (PF) 1 %-1:200000 IJ SOLN
INTRAMUSCULAR | Status: AC
  Filled 2015-02-14: qty 30

## 2015-02-14 MED ORDER — DEXTROSE 50 % IV SOLN: 0.5000 | Freq: Once | INTRAVENOUS | Status: DC | PRN

## 2015-02-14 MED ORDER — HEPARIN SODIUM (PORCINE) 1000 UNIT/ML IJ SOLN
INTRAMUSCULAR | Status: DC | PRN
  Administered 2015-02-14: 3000 [IU] via INTRAVENOUS

## 2015-02-14 MED ORDER — FENTANYL CITRATE (PF) 100 MCG/2ML IJ SOLN
INTRAMUSCULAR | Status: AC
  Filled 2015-02-14: qty 2

## 2015-02-14 MED ORDER — FENTANYL CITRATE (PF) 100 MCG/2ML IJ SOLN
INTRAMUSCULAR | Status: DC | PRN
  Administered 2015-02-14 (×2): 50 ug via INTRAVENOUS

## 2015-02-14 MED ORDER — ATROPINE SULFATE 0.1 MG/ML IJ SOLN: 0.5000 mg | Freq: Once | INTRAMUSCULAR | Status: DC | PRN

## 2015-02-14 MED ORDER — LIDOCAINE HCL (PF) 1 % IJ SOLN
INTRAMUSCULAR | Status: DC | PRN
  Administered 2015-02-14: 10 mL

## 2015-02-14 MED ORDER — HEPARIN (PORCINE) IN NACL 2-0.9 UNIT/ML-% IJ SOLN
INTRAMUSCULAR | Status: AC
Start: 2015-02-14 — End: 2015-02-14
  Filled 2015-02-14: qty 1000

## 2015-02-14 MED ORDER — CEFAZOLIN SODIUM 1-5 GM-% IV SOLN
1.0000 g | Freq: Once | INTRAVENOUS | Status: AC
  Administered 2015-02-14: 1 g via INTRAVENOUS

## 2015-02-14 MED ORDER — HYDROMORPHONE HCL 1 MG/ML IJ SOLN: 1.0000 mg | INTRAMUSCULAR | Status: DC | PRN

## 2015-02-14 SURGICAL SUPPLY — 12 items
BALLN LUTONIX DCB 7X60X130 (BALLOONS) ×3
BALLOON LUTONIX DCB 7X60X130 (BALLOONS) ×1 IMPLANT
CATH KUMPE (CATHETERS) ×2
CATH SLIP 5FR 0.38 X 40 KMP (CATHETERS) ×1 IMPLANT
DEVICE PRESTO INFLATION (MISCELLANEOUS) ×3 IMPLANT
DRAPE BRACHIAL (DRAPES) ×3 IMPLANT
GLIDEWIRE STIFF .35X180X3 HYDR (WIRE) ×3 IMPLANT
KIT 5FR STIFF NT/TG (MISCELLANEOUS) ×3 IMPLANT
PACK ANGIOGRAPHY (CUSTOM PROCEDURE TRAY) ×3 IMPLANT
SHEATH BRITE TIP 6FRX5.5 (SHEATH) ×3 IMPLANT
TOWEL OR 17X26 4PK STRL BLUE (TOWEL DISPOSABLE) ×3 IMPLANT
WIRE MAGIC TORQUE 260C (WIRE) ×3 IMPLANT

## 2015-02-14 NOTE — Op Note (Signed)
[facility]    OPERATIVE NOTE   PROCEDURE: 1.   Right radiocephalic arteriovenous fistula cannulation under ultrasound guidance in a retrograde fashion near the antecubital fossa 2.   Right arm fistulagram including central venogram 3.   Percutaneous transluminal angioplasty of mid to distal forearm cephalic vein for in-stent stenosis/occlusion with 7 mm diameter Lutonix drug-coated angioplasty   PRE-OPERATIVE DIAGNOSIS: 1. ESRD 2. Poorly functional right radiocephalic AVF  POST-OPERATIVE DIAGNOSIS: same as above   SURGEON: Leotis Pain, MD  ANESTHESIA: local with MCS  ESTIMATED BLOOD LOSS: Minimal  FINDING(S): 1. Occlusion of stent in the mid to distal forearm cephalic vein  SPECIMEN(S):  None  CONTRAST: 25 cc  INDICATIONS: [name] is a 34 y.o. female who presents with malfunctioning  right arm radiocephalic arteriovenous fistula.  The patient is scheduled for  right arm fistulagram.  The patient is aware the risks include but are not limited to: bleeding, infection, thrombosis of the cannulated access, and possible anaphylactic reaction to the contrast.  The patient is aware of the risks of the procedure and elects to proceed forward.  DESCRIPTION: After full informed written consent was obtained, the patient was brought back to the angiography suite and placed supine upon the angiography table.  The patient was connected to monitoring equipment.  The  right arm was prepped and draped in the standard fashion for a percutaneous access intervention.  Under ultrasound guidance, the  right radiocephalic arteriovenous fistula was cannulated with a micropuncture needle under direct ultrasound guidance in a retrograde fashion and the cephalic vein just below the antecubital fossa and a permanent image was performed.  The microwire was advanced into the fistula and the needle was exchanged for the a microsheath.  I then upsized to a 6 Fr Sheath and imaging  was performed.  Hand injections were completed to image the access including the central venous system. This demonstrated occlusion of the previous stent in the cephalic vein in the mid to distal forearm.  Based on the images, this patient will need treatment of the stent to restore patency of the fistula and make it functional. I then gave the patient 3000 units of intravenous heparin.  I then crossed the stenosis with a glide wire and Kumpe catheter. Kumpe catheter was placed at the anastomosis and imaging was performed. I then exchanged for a Magic torque wire.  Based on the imaging, a 7 mm x 6 cm  Lutonix drug-coated angioplasty balloon was selected.  The balloon was centered around the in-stent stenosis and inflated to 14 ATM for 1 minute(s).  On completion imaging, a less than 10 % residual stenosis was present.     Based on the completion imaging, no further intervention is necessary.  The wire and balloon were removed from the sheath.  A 4-0 Monocryl purse-string suture was sewn around the sheath.  The sheath was removed while tying down the suture.  A sterile bandage was applied to the puncture site.  COMPLICATIONS: None  CONDITION: Stable   [name]  02/14/2015 12:19 PM

## 2015-02-14 NOTE — OR Nursing (Signed)
Marval Regal Rn in to give meds and monitor

## 2015-02-14 NOTE — H&P (Signed)
[facility] Admission History & Physical  MRN : [id number]  [name] is a 34 y.o. (02-Apr-1981) female who presents with chief complaint of poorly functioning AVF.  History of Present Illness: 34 yo WF with eSRD.  Has right arm AVF which has been working poorly at dialysis with decreased flows.  The dialysis center has requested a fistulagram.  Current Facility-Administered Medications  Medication Dose Route Frequency Provider Last Rate Last Dose  . 0.9 %  sodium chloride infusion   Intravenous Continuous Annice Needy, MD      . atropine 0.1 MG/ML injection 0.5 mg  0.5 mg Intravenous Once PRN Annice Needy, MD      . ceFAZolin (ANCEF) IVPB 1 g/50 mL premix  1 g Intravenous Once Annice Needy, MD      . dextrose 50 % solution 25 mL  0.5 ampule Intravenous Once PRN Annice Needy, MD      . HYDROmorphone (DILAUDID) injection 1 mg  1 mg Intravenous Q30 min PRN Annice Needy, MD      . ondansetron [facility]) injection 4 mg  4 mg Intravenous Q30 min PRN Annice Needy, MD        Past Medical History  Diagnosis Date  . Arthritis     Past Surgical History  Procedure Laterality Date  . Vascular surgery      Social History History  Substance Use Topics  . Smoking status: Never Smoker   . Smokeless tobacco: Not on file  . Alcohol Use: No    Family History No family history of renal disease, bleeding issues or clotting problems  No Known Allergies   REVIEW OF SYSTEMS (Negative unless checked)  Constitutional: [x] Weight loss  [] Fever  [] Chills Cardiac: [] Chest pain   [] Chest pressure   [] Palpitations   [] Shortness of breath when laying flat   [] Shortness of breath at rest   [] Shortness of breath with exertion. Vascular:  [] Pain in legs with walking   [] Pain in legs at rest   [] Pain in legs when laying flat   [] Claudication   [] Pain in feet when walking  [] Pain in feet at rest  [] Pain in feet when laying flat   [] History of DVT   [] Phlebitis   [] Swelling in legs    [] Varicose veins   [] Non-healing ulcers Pulmonary:   [] Uses home oxygen   [] Productive cough   [] Hemoptysis   [] Wheeze  [] COPD   [] Asthma Neurologic:  [] Dizziness  [] Blackouts   [] Seizures   [] History of stroke   [] History of TIA  [] Aphasia   [] Temporary blindness   [] Dysphagia   [] Weakness or numbness in arms   [] Weakness or numbness in legs Musculoskeletal:  [] Arthritis   [] Joint swelling   [] Joint pain   [] Low back pain Hematologic:  [] Easy bruising  [] Easy bleeding   [] Hypercoagulable state   [] Anemic  [] Hepatitis Gastrointestinal:  [] Blood in stool   [] Vomiting blood  [x] Gastroesophageal reflux/heartburn   [] Difficulty swallowing. Genitourinary:  [x] Chronic kidney disease   [] Difficult urination  [] Frequent urination  [] Burning with urination   [] Blood in urine Skin:  [] Rashes   [] Ulcers   [] Wounds Psychological:  [] History of anxiety   []  History of major depression.  Physical Examination  There were no vitals filed for this visit. There is no height or weight on file to calculate BMI.  Head: [place]/AT, No temporalis wasting. Prominent temp pulse not noted. Ear/Nose/Throat: Hearing grossly intact, nares w/o erythema or drainage, oropharynx w/o Erythema/Exudate,  Mallampati score: one Eyes: PERRLA, EOMI.  Neck: Supple, no nuchal rigidity.  No bruit or JVD.  Pulmonary:  Good air movement, clear to auscultation bilaterally, no use of accessory muscles.  Cardiac: RRR, normal S1, S2, no Murmurs, rubs or gallops. Vascular: right radiocephalic AVF, decreased thrill                                          Gastrointestinal: soft, non-tender/non-distended. No guarding/reflex.  Musculoskeletal: M/S 5/5 throughout.  Extremities without ischemic changes.  No deformity or atrophy.  Neurologic: CN 2-12 intact. Pain and light touch intact in extremities.  Symmetrical.  Speech is fluent. Motor exam as listed above. Psychiatric: Judgment intact, Mood & affect appropriate for pt's clinical  situation. Dermatologic: No rashes or ulcers noted.  No cellulitis or open wounds. Lymph : No Cervical, Axillary, or Inguinal lymphadenopathy.     CBC Lab Results  Component Value Date   WBC 6.2 09/24/2014   HGB 12.8 09/24/2014   HCT 39.7 09/24/2014   MCV 104* 09/24/2014   PLT 176 09/24/2014    BMET    Component Value Date/Time   NA 135* 09/24/2014 0450   K 4.1 09/24/2014 0450   CL 95* 09/24/2014 0450   CO2 27 09/24/2014 0450   GLUCOSE 243* 09/24/2014 0450   BUN 29* 09/24/2014 0450   CREATININE 8.07* 09/24/2014 0450   CALCIUM 7.7* 09/24/2014 0450   GFRNONAA 7* 04/24/2013 1543   GFRAA 8* 04/24/2013 1543   CrCl cannot be calculated (Unknown ideal weight.).  COAG No results found for: INR, PROTIME    Assessment/Plan 1.  ESRD 2.  Poorly functioning AVF  Plan to perform a fistulagram with possible intervention.  Risks and benefits were discussed and informed consent was obtained.     Velena Keegan, MD  02/14/2015 10:57 AM

## 2015-02-14 NOTE — Discharge Instructions (Signed)
WATCH FOR SIGNS OF BLEEDING AND INFECTION SUCH AS BLEEDING, SWELLING, REDNESS, PAIN THAT IS GETTING WORSE OR DOES NOT DECREASE WITH MEDICINE, CLOUDY OR YELLOW DRAINAGE AND FEVERS  MAY SHOWER TOMORROW  MAY DRIVE TOMORROW  AVOID LIFTING OVER 10 POUNDS WITH LEFT ARM OVER THE NEXT TWO DAYS  MAY REMOVE DRESSING TOMORROW  MAY USE TYLENOL FOR DISCOMFORT AT LEFT ARM GRAFT SITE

## 2015-03-28 ENCOUNTER — Encounter: Payer: Medicare Other | Attending: Surgery | Admitting: Surgery

## 2015-03-28 DIAGNOSIS — Z992 Dependence on renal dialysis: Secondary | ICD-10-CM | POA: Insufficient documentation

## 2015-03-28 DIAGNOSIS — N186 End stage renal disease: Secondary | ICD-10-CM | POA: Insufficient documentation

## 2015-03-28 DIAGNOSIS — E1040 Type 1 diabetes mellitus with diabetic neuropathy, unspecified: Secondary | ICD-10-CM | POA: Insufficient documentation

## 2015-03-28 DIAGNOSIS — E10621 Type 1 diabetes mellitus with foot ulcer: Secondary | ICD-10-CM | POA: Diagnosis not present

## 2015-03-28 DIAGNOSIS — E1021 Type 1 diabetes mellitus with diabetic nephropathy: Secondary | ICD-10-CM | POA: Insufficient documentation

## 2015-03-28 DIAGNOSIS — M069 Rheumatoid arthritis, unspecified: Secondary | ICD-10-CM | POA: Diagnosis not present

## 2015-03-28 DIAGNOSIS — L97412 Non-pressure chronic ulcer of right heel and midfoot with fat layer exposed: Secondary | ICD-10-CM | POA: Diagnosis present

## 2015-03-28 DIAGNOSIS — D649 Anemia, unspecified: Secondary | ICD-10-CM | POA: Diagnosis not present

## 2015-03-29 NOTE — Progress Notes (Signed)
[name], [name] ([id number]) Visit Report for 03/28/2015 Abuse/Suicide Risk Screen Details [name], [name] 03/28/2015 11:15 Patient Name: Date of Service: [name] Medical Record Patient Account Number: [id number] Number: [name], Treating RN: 1980/11/01 (34 y.o. [name] Date of Birth/Sex: Female) Other Clinician: Primary Care [name] Physician: Physician/Extender: Referring Physician: [name] [name] in Treatment: 0 Abuse/Suicide Risk Screen Items Answer ABUSE/SUICIDE RISK SCREEN: Has anyone close to you tried to hurt or harm you recentlyo No Do you feel uncomfortable with anyone in your familyo No Has anyone forced you do things that you didnot want to doo No Do you have any thoughts of harming yourselfo No Patient displays signs or symptoms of abuse and/or neglect. No Electronic Signature(s) Signed: 03/28/2015 4:58:02 PM By: [name] [name], RN Entered By: [name] on 03/28/2015 11:32:05 [name] ([id number]) -------------------------------------------------------------------------------- Activities of Daily Living Details [name], [name] 03/28/2015 11:15 Patient Name: Date of Service: [name] Medical Record Patient Account Number: [id number] Number: [name], Treating RN: May 09, 1981 (34 y.o. [name] Date of Birth/Sex: Female) Other Clinician: Primary Care [name] Physician: Physician/Extender: Referring Physician: [name] [name] in Treatment: 0 Activities of Daily Living Items Answer Activities of Daily Living (Please select one for each item) Drive Automobile Need Assistance Take Medications Completely Able Use Telephone Completely Able Care for Appearance Completely Able Use Toilet Completely Able Bath / Shower Completely Able Dress Self Completely Able Feed Self Completely Able Walk Completely Able Get In / Out Bed Completely  Able Housework Completely Able Prepare Meals Completely Able Handle Money Completely Able Shop for Self Completely Able Electronic Signature(s) Signed: 03/28/2015 4:58:02 PM By: [name] [name], RN Entered By: [name] on 03/28/2015 11:31:40 [name] ([id number]) -------------------------------------------------------------------------------- Education Assessment Details [name], [name] 03/28/2015 11:15 Patient Name: Date of Service: [name] [name] Medical Record Patient Account Number: [id number] Number: [name], Treating RN: 31-Oct-1980 (34 y.o. [name] Date of Birth/Sex: Female) Other Clinician: Primary Care [name] Physician: Physician/Extender: Referring Physician: [name] [name] in Treatment: 0 Primary Learner Assessed: Patient Learning Preferences/Education Level/Primary Language Learning Preference: Explanation Highest Education Level: College or Above Preferred Language: English Cognitive Barrier Assessment/Beliefs Language Barrier: No Physical Barrier Assessment Impaired Vision: Yes Glasses Impaired Hearing: No Decreased Hand dexterity: No Knowledge/Comprehension Assessment Knowledge Level: High Comprehension Level: High Ability to understand written High instructions: Ability to understand verbal High instructions: Motivation Assessment Anxiety Level: Calm Cooperation: Cooperative Education Importance: Acknowledges Need Interest in Health Problems: Asks Questions Perception: Coherent Willingness to Engage in Self- High Management Activities: Readiness to Engage in Self- High Management Activities: Electronic Signature(s) Signed: 03/28/2015 4:58:02 PM By: [name] [name], RN [name] ([id number]) Entered By: [name] on 03/28/2015 11:31:19 [name] ([id number]) -------------------------------------------------------------------------------- Fall Risk Assessment  Details [name], [name] 03/28/2015 11:15 Patient Name: Date of Service: [name] [name] Medical Record Patient Account Number: [id number] Number: [name], Treating RN: Mar 22, 1981 (34 y.o.  [name] Date of Birth/Sex: Female) Other Clinician: Primary Care [name] Physician: Physician/Extender: Referring Physician: [name] [name] in Treatment: 0 Fall Risk Assessment Items FALL RISK ASSESSMENT: History of falling - immediate or within 3 months 0 No Secondary diagnosis 0 No Ambulatory aid None/bed rest/wheelchair/nurse 0 Yes Crutches/cane/walker 0 No Furniture 0 No IV Access/Saline Lock 0 No Gait/Training Normal/bed rest/immobile 0 Yes Weak 0 No Impaired 0 No Mental Status Oriented to own ability 0 Yes Electronic Signature(s) Signed: 03/28/2015 4:58:02  PM By: [name] [name], RN Entered By: [name] on 03/28/2015 11:30:55 [name] ([id number]) -------------------------------------------------------------------------------- Foot Assessment Details [name], [name] 03/28/2015 11:15 Patient Name: Date of Service: [name] Medical Record Patient Account Number: [id number] Number: [name], Treating RN: May 15, 1981 (34 y.o. [name] Date of Birth/Sex: Female) Other Clinician: Primary Care [name] Physician: Physician/Extender: Referring Physician: [name] [name] in Treatment: 0 Foot Assessment Items Site Locations + = Sensation present, - = Sensation absent, C = Callus, U = Ulcer R = Redness, W = Warmth, M = Maceration, PU = Pre-ulcerative lesion F = Fissure, S = Swelling, D = Dryness Assessment Right: Left: Other Deformity: No No Prior Foot Ulcer: No No Prior Amputation: No No Charcot Joint: No No Ambulatory Status: Ambulatory Without Help Gait: Steady Electronic Signature(s) Signed: 03/28/2015 4:58:02 PM By: [name] [name], RN Entered By: [name] on  03/28/2015 11:30:20 [name] ([id number] [name], [name] ([id number]) -------------------------------------------------------------------------------- Nutrition Risk Assessment Details [name], [name] 03/28/2015 11:15 Patient Name: Date of Service: [name] [name] Medical Record Patient Account Number: [id number] Number: [name], Treating RN: January 02, 1981 (33 y.o. Wann [name] Date of Birth/Sex: Female) Other Clinician: Primary Care [name] Physician: Physician/Extender: Referring Physician: [name] [name] in Treatment: 0 Height (in): 68 Weight (lbs): 122 Body Mass Index (BMI): 18.5 Nutrition Risk Assessment Items NUTRITION RISK SCREEN: I have an illness or condition that made me change the kind and/or 0 No amount of food I eat I eat fewer than two meals per day 0 No I eat few fruits and vegetables, or milk products 0 No I have three or more drinks of beer, liquor or wine almost every day 0 No I have tooth or mouth problems that make it hard for me to eat 0 No I don't always have enough money to buy the food I need 0 No I eat alone most of the time 0 No I take three or more different prescribed or over-the-counter drugs a 0 No day Without wanting to, I have lost or gained 10 pounds in the last six 0 No months I [name] not always physically able to shop, cook and/or feed myself 0 No Nutrition Protocols Good Risk Protocol 0 No interventions needed Moderate Risk Protocol Electronic Signature(s) Signed: 03/28/2015 4:58:02 PM By: [name] [name], RN Entered By: [name] on 03/28/2015 11:30:43
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
## 2015-03-29 NOTE — Progress Notes (Signed)
[name] [name], [name] [name] ([id number]) Visit Report for 03/28/2015 Chief Complaint Document Details [name] [name], [name] [name] 03/28/2015 11:15 Patient Name: Date of Service: N. AM Medical Record Patient Account Number: [id number] Number: Treating RN: [birth year] (33 y.o. Other Clinician: Date of Birth/Sex: Female) Treating Phoenix Riesen Primary Care Physician/Extender: [name] Physician: Referring Physician: [name] in Treatment: 0 Information Obtained from: Patient Chief Complaint Patient presents to the wound care center for a consult due non healing wound. 34 year old patient with a nonhealing ulcer of the right heel for 2 months. Electronic Signature(s) Signed: 03/28/2015 1:47:03 PM By: [name] MD, FACS Entered By: [name] on 03/28/2015 13:01:44 [name] [name] ([id number]) -------------------------------------------------------------------------------- Debridement Details [name] [name], [name] [name] 03/28/2015 11:15 Patient Name: Date of Service: N. AM Medical Record Patient Account Number: [id number] Number: Treating RN: May 30, 1981 (33 y.o. Other Clinician: Date of Birth/Sex: Female) Treating Jeremian Whitby Primary Care Physician/Extender: [name] Physician: Referring Physician: [name] in Treatment: 0 Debridement Performed for Wound #1 Right,Distal Calcaneous Assessment: Performed By: Physician Tristan Schroeder., MD Debridement: Debridement Pre-procedure Yes Verification/Time Out Taken: Start Time: 12:06 Pain Control: Lidocaine 4% Topical Solution Level: Skin/Subcutaneous Tissue Total Area Debrided (L x 2 (cm) x 2 (cm) = 4 (cm) W): Tissue and other Viable, Non-Viable, Callus, Exudate, Fibrin/Slough, Skin, Subcutaneous material debrided: Instrument: Curette Bleeding: Minimum Hemostasis Achieved: Pressure End Time: 12:14 Procedural Pain: 0 Post Procedural Pain: 0 Response to Treatment: Procedure was  tolerated well Post Debridement Measurements of Total Wound Length: (cm) 2 Width: (cm) 2 Depth: (cm) 0.1 Volume: (cm) 0.314 Electronic Signature(s) Signed: 03/28/2015 1:47:03 PM By: [name] MD, FACS Entered By: [name] on 03/28/2015 13:00:24 [name] [name] ([id number]) -------------------------------------------------------------------------------- Debridement Details [name] [name] 03/28/2015 11:15 Patient Name: Date of Service: [name] [name] AM Medical Record Patient Account Number: [id number] Number: Treating RN: 15-Jul-1981 (33 y.o. Other Clinician: Date of Birth/Sex: Female) Treating Che Below Primary Care Physician/Extender: [name] Physician: Referring Physician: [name] in Treatment: 0 Debridement Performed for Wound #2 Right,Proximal Calcaneous Assessment: Performed By: Physician Tristan Schroeder., MD Debridement: Debridement Pre-procedure Yes Verification/Time Out Taken: Start Time: 12:06 Pain Control: Lidocaine 4% Topical Solution Level: Skin/Subcutaneous Tissue Total Area Debrided (L x 0.9 (cm) x 0.6 (cm) = 0.54 (cm) W): Tissue and other Viable, Non-Viable, Callus, Exudate, Fibrin/Slough, Skin, Subcutaneous material debrided: Instrument: Curette Bleeding: Minimum Hemostasis Achieved: Pressure End Time: 12:14 Procedural Pain: 0 Post Procedural Pain: 0 Response to Treatment: Procedure was tolerated well Post Debridement Measurements of Total Wound Length: (cm) 0.9 Width: (cm) 0.6 Depth: (cm) 0.2 Volume: (cm) 0.085 Electronic Signature(s) Signed: 03/28/2015 1:47:03 PM By: [name] MD, FACS Entered By: [name] on 03/28/2015 13:01:07 [name] [name] ([id number]) -------------------------------------------------------------------------------- HPI Details [name] [name], [name] [name] 03/28/2015 11:15 Patient Name: Date of Service: [name] [name] AM Medical Record Patient Account Number:  [id number] Number: Treating RN: 1980-11-16 (33 y.o. Other Clinician: Date of Birth/Sex: Female) Treating Tanush Drees Primary Care Physician/Extender: [name] Physician: Referring Physician: [name] in Treatment: 0 History of Present Illness Location: right heel ulcer Quality: Patient reports No Pain. Severity: Patient states wound are getting worse. Duration: Patient has had the wound for > 2 months prior to seeking treatment at the wound center Context: The wound occurred when the patient injured her foot on something sharp on her deck. Modifying Factors: Consults to this date include:antibiotics which included Keflex in about a month ago and now at present time she is on clindamycin. Associated Signs and Symptoms: Patient reports having difficulty  standing for long periods. HPI Description: [name] [name] is a 34 y.o. female who presents to our wound center referred by her PCP Dr. Zada Finders for nonhealing ulcers on the lateral aspect of the right heel. The patient reports about 10 weeks ago she suffered an injury of the right heel. The wounds got infected, she was given Cephalexin and the use of Bactroban. Of note she has a history of type 1 diabetes mellitus that has been uncontrolled. Past medical history significant for type 1 diabetes mellitus not controlled, ankylosing spondylitis, anorexia nervosa, irritable bowel syndrome, chronic kidney disease, chronic diarrhea. She has been treated by her PCP with duoderm which is to be changed every 3 days. Her last hemoglobin A1c in March was 11. Electronic Signature(s) Signed: 03/28/2015 1:47:03 PM By: [name] MD, FACS Entered By: [name] on 03/28/2015 13:03:59 [name] [name] ([id number]) -------------------------------------------------------------------------------- Physical Exam Details [name] [name], [name] [name] 03/28/2015 11:15 Patient Name: Date of Service: N. AM Medical Record Patient  Account Number: [id number] Number: Treating RN: [birth year] (33 y.o. Other Clinician: Date of Birth/Sex: Female) Treating Rishab Stoudt Primary Care Physician/Extender: [name] Physician: Referring Physician: [name] in Treatment: 0 Constitutional . Pulse regular. Respirations normal and unlabored. Afebrile. . Eyes Nonicteric. Reactive to light. Ears, Nose, Mouth, and Throat Lips, teeth, and gums WNL.Marland Kitchen Moist mucosa without lesions . Neck supple and nontender. No palpable supraclavicular or cervical adenopathy. Normal sized without goiter. Respiratory WNL. No retractions.. Cardiovascular Pedal Pulses WNL. Her ABIs both lower extremities were noncompressible.. No clubbing, cyanosis or edema. Gastrointestinal (GI) Abdomen without masses or tenderness.. No liver or spleen enlargement or tenderness.. Musculoskeletal Adexa without tenderness or enlargement.. Digits and nails w/o clubbing, cyanosis, infection, petechiae, ischemia, or inflammatory conditions.. Integumentary (Hair, Skin) she has a ulcerated area on the lateral part of her right heel and it is covered with a lot of slough and debris and a lot of surrounding callus and which needs a lot of sharp debridement.. No crepitus or fluctuance. No peri-wound warmth or erythema. No masses.Marland Kitchen Psychiatric Judgement and insight Intact.. No evidence of depression, anxiety, or agitation.. Electronic Signature(s) Signed: 03/28/2015 1:47:03 PM By: [name] MD, FACS Entered By: [name] on 03/28/2015 13:05:07 [name] [name] ([id number]) -------------------------------------------------------------------------------- Physician Orders Details [name] [name], [name] [name] 03/28/2015 11:15 Patient Name: Date of Service: [name] [name] AM Medical Record Patient Account Number: [id number] Number: [name], RN, [name], Treating RN: 1981/07/21 (33 y.o. Leslie Sink Date of Birth/Sex: Female) Other Clinician: Primary Care  Treating [name], [name], Barnabas Henriques Physician: Physician/Extender: Referring Physician: [name] in Treatment: 0 Verbal / Phone Orders: Yes Clinician: [name], RN, [name], [name] Read Back and Verified: Yes Diagnosis Coding Wound Cleansing Wound #1 Right,Distal Calcaneous o Clean wound with Normal Saline. Wound #2 Right,Proximal Calcaneous o Clean wound with Normal Saline. Anesthetic Wound #1 Right,Distal Calcaneous o Topical Lidocaine 4% cream applied to wound bed prior to debridement Wound #2 Right,Proximal Calcaneous o Topical Lidocaine 4% cream applied to wound bed prior to debridement Skin Barriers/Peri-Wound Care Wound #1 Right,Distal Calcaneous o Skin Prep Wound #2 Right,Proximal Calcaneous o Skin Prep Primary Wound Dressing Wound #1 Right,Distal Calcaneous o Hydrafera Blue Wound #2 Right,Proximal Calcaneous o Hydrafera Blue Secondary Dressing Wound #1 Right,Distal Calcaneous o Boardered Foam Dressing Roel, Mairely N. ([id number]) Wound #2 Right,Proximal Calcaneous o Boardered Foam Dressing Dressing Change Frequency Wound #1 Right,Distal Calcaneous o Change dressing every other day. Wound #2 Right,Proximal Calcaneous o Change dressing every other day. Follow-up Appointments Wound #1 Right,Distal Calcaneous o Return Appointment in  1 week. Wound #2 Right,Proximal Calcaneous o Return Appointment in 1 week. Off-Loading Wound #1 Right,Distal Calcaneous o Open toe surgical shoe with peg assist. Wound #2 Right,Proximal Calcaneous o Open toe surgical shoe with peg assist. Electronic Signature(s) Signed: 03/28/2015 1:47:03 PM By: [name] MD, FACS Signed: 03/28/2015 4:58:02 PM By: Elpidio Eric [name], RN Entered By: Elpidio Eric on 03/28/2015 12:19:10 [name] [name] ([id number]) -------------------------------------------------------------------------------- Problem List Details [name] [name], [name] [name] 03/28/2015  11:15 Patient Name: Date of Service: N. AM Medical Record Patient Account Number: [id number] Number: Treating RN: 07-11-1981 (33 y.o. 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 Other Clinician: Date of Birth/Sex: Female) Treating Glorianna Gott Primary Care Physician/Extender: [name] Physician: Referring Physician: [name] in Treatment: 0 Subjective Chief Complaint Information obtained from Patient Patient presents to the wound care center for a consult due non healing wound. 34 year old patient with a nonhealing ulcer of the right heel for 2 months. History of Present Illness (HPI) The following HPI elements were documented for the patient's wound: Location: right heel ulcer Quality: Patient reports No Pain. Severity: Patient states wound are getting worse. Duration: Patient has had the wound for > 2 months prior to seeking treatment at the wound center Context: The wound occurred when the patient injured her foot on something sharp on her deck. Modifying Factors: Consults to this date  include:antibiotics which included Keflex in about a month ago and now at present time she is on clindamycin. Associated Signs and Symptoms: Patient reports having difficulty standing for long periods. [name] [name] is a 34 y.o. female who presents to our wound center referred by her PCP Dr. Zada Finders for nonhealing ulcers on the lateral aspect of the right heel. The patient reports about 10 weeks ago she suffered an injury of the right heel. The wounds got infected, she was given Cephalexin and the use of Bactroban. Of note she has a history of type 1 diabetes mellitus that has been uncontrolled. Past medical history significant for type 1 diabetes mellitus not controlled, ankylosing spondylitis, anorexia nervosa, irritable bowel syndrome, chronic kidney disease, chronic diarrhea. She has been treated by her PCP with duoderm which is to be changed every 3 days. Her last hemoglobin A1c in March was 11. Wound History Patient presents with 1 open wound that has been present for approximately 34month. Patient has been treating wound in the following manner: dermapatch. Laboratory tests have not been performed in the last month. Patient reportedly has not tested positive for an antibiotic resistant organism. Patient reportedly has not tested positive for osteomyelitis. Patient reportedly has not had testing performed to evaluate circulation in the legs. Patient experiences the following problems associated with their wounds: infection, swelling. [name] [name], [name] [name] ([id number]) Patient History Information obtained from Patient. Allergies no known drug allergies Family History Cancer - Maternal Grandparents, Diabetes - Maternal Grandparents, Paternal Grandparents, Heart Disease - Father, Hypertension - Maternal  Grandparents, Stroke - Paternal Grandparents, Thyroid Problems - Mother, No family history of Hereditary Spherocytosis, Kidney Disease, Lung Disease, Seizures, Tuberculosis. Social  History Never smoker, Marital Status - Single, Alcohol Use - Never, Drug Use - No History, Caffeine Use - Daily - soda. Medical History Eyes Denies history of Cataracts, Glaucoma, Optic Neuritis Ear/Nose/Mouth/Throat Denies history of Chronic sinus problems/congestion, Middle ear problems Hematologic/Lymphatic Patient has history of Anemia Respiratory Denies history of Aspiration, Asthma, Pneumothorax, Sleep Apnea, Tuberculosis Cardiovascular Denies history of Angina, Arrhythmia, Congestive Heart Failure, Coronary Artery Disease, Deep Vein Thrombosis, Hypertension, Hypotension, Myocardial Infarction, Peripheral Arterial Disease, Peripheral Venous Disease, Phlebitis, Vasculitis Gastrointestinal Denies history of Cirrhosis , Colitis, Crohn s, Hepatitis A, Hepatitis B, Hepatitis C Endocrine Patient has history of Type I Diabetes - since ager 31 Genitourinary Patient has history of End Stage Renal Disease - for 4 years Immunological Denies history of Lupus Erythematosus, Raynaud s, Scleroderma Integumentary (Skin) Denies history of History of Burn, History of pressure wounds Musculoskeletal Patient has history of Rheumatoid Arthritis Denies history of Gout, Osteoarthritis, Osteomyelitis Neurologic Patient has history of Neuropathy Denies history of Dementia, Quadriplegia, Paraplegia, Seizure Disorder Oncologic Denies history of Received Chemotherapy, Received Radiation Psychiatric Denies history of Anorexia/bulimia, Confinement Anxiety Helzer, Armanda N. ([id number]) Patient is treated with Insulin. Blood sugar is tested. Blood sugar results noted at the following times: Breakfast - 180-240. Medical And Surgical History Notes Cardiovascular low blood pressure Gastrointestinal HX of nausea, vomiting gastroparesis Psychiatric Insomnia Review of Systems (ROS) Constitutional Symptoms (General Health) The patient has no complaints or symptoms. Eyes Complains or has symptoms  of Vision Changes. Ear/Nose/Mouth/Throat The patient has no complaints or symptoms. Hematologic/Lymphatic The patient has no complaints or symptoms. Respiratory The patient has no complaints or symptoms. Cardiovascular The patient has no complaints or symptoms. Gastrointestinal Complains or has symptoms of Nausea. Endocrine The patient has no complaints or symptoms. Genitourinary The patient has no complaints or symptoms. Immunological The patient has no complaints or symptoms. Integumentary (Skin) Complains or has symptoms of Wounds, Swelling. Musculoskeletal The patient has no complaints or symptoms. Neurologic Complains or has symptoms of Numbness/parasthesias, Focal/Weakness. Oncologic The patient has no complaints or symptoms. Psychiatric The patient has no complaints or symptoms. Medications midodrine 5 mg tablet oral tablet oral mirtazapine 15 mg disintegrating tablet oral tablet,disintegrating oral [name] [name], [name] ([id number]) hydrocodone 5 mg-acetaminophen 325 mg tablet oral tablet oral oxycodone-acetaminophen 10 mg-325 mg tablet oral tablet oral sodium bicarbonate 650 mg tablet oral tablet oral gabapentin 100 mg capsule oral capsule oral opium tincture 10 mg/mL (morphine) oral oral tincture oral dronabinol 2.5 mg capsule oral capsule oral promethazine 25 mg tablet oral tablet oral Requip 0.25 mg tablet oral tablet oral hyoscyamine sulfate 0.125 mg tablet oral tablet oral Fosrenol 1,000 mg chewable tablet oral tablet,chewable oral sevelamer carbonate 800 mg tablet oral tablet oral Humalog 100 unit/mL subcutaneous solution subcutaneous solution subcutaneous Levemir FlexTouch 100 unit/mL (3 mL) subcutaneous insulin pen subcutaneous insulin pen subcutaneous Novolog Flexpen 100 unit/mL subcutaneous subcutaneous insulin pen subcutaneous fludrocortisone 0.1 mg tablet oral tablet oral Zenpep 25,000-85,000-136,000 unit capsule,delayed release oral capsule,delayed  release(DR/EC) oral pantoprazole 40 mg tablet,delayed release oral tablet,delayed release (DR/EC) oral zolpidem 5 mg tablet oral tablet oral venlafaxine 75 mg tablet oral tablet oral Sandostatin LAR Depot 10 mg intramuscular susp,extended release intramuscular suspension,extended rel recon intramuscular Objective Constitutional Pulse regular. Respirations normal and unlabored. Afebrile. Vitals Time Taken: 11:29 AM, Height: 68 in, Source: Stated, Weight: 122 lbs, Source: Measured, BMI: 18.5, Temperature: 98.1 F, Pulse:  70 bpm, Respiratory Rate: 17 breaths/min, Blood Pressure: 92/60 mmHg. Eyes Nonicteric. Reactive to light. Ears, Nose, Mouth, and Throat Lips, teeth, and gums WNL.Marland Kitchen Moist mucosa without lesions . Neck supple and nontender. No palpable supraclavicular or cervical adenopathy. Normal sized without goiter. Respiratory WNL. No retractions.. Cardiovascular Pedal Pulses WNL. Her ABIs both lower extremities were noncompressible.. No clubbing, cyanosis or edema. ANGELETTA, [name] [name] ([id number]) Gastrointestinal (GI) Abdomen without masses or tenderness.. No liver or spleen enlargement or tenderness.. Musculoskeletal Adexa without tenderness or enlargement.. Digits and nails w/o clubbing, cyanosis, infection, petechiae, ischemia, or inflammatory conditions.Marland Kitchen Psychiatric Judgement and insight Intact.. No evidence of depression, anxiety, or agitation.. Integumentary (Hair, Skin) she has a ulcerated area on the lateral part of her right heel and it is covered with a lot of slough and debris and a lot of surrounding callus and which needs a lot of sharp debridement.. No crepitus or fluctuance. No peri-wound warmth or erythema. No masses.. Wound #1 status is Open. Original cause of wound was Gradually Appeared. The wound is located on the Right,Distal Calcaneous. The wound measures 1.1cm length x 1cm width x 0.2cm depth; 0.864cm^2 area and 0.173cm^3 volume. The wound is limited  to skin breakdown. There is no tunneling noted. There is a small amount of serous drainage noted. The wound margin is distinct with the outline attached to the wound base. There is medium (34-66%) pink granulation within the wound bed. There is a small (1-33%) amount of necrotic tissue within the wound bed including Adherent Slough. The periwound skin appearance exhibited: Moist. The periwound skin appearance did not exhibit: Callus, Crepitus, Excoriation, Fluctuance, Friable, Induration, Localized Edema, Rash, Scarring, Dry/Scaly, Maceration, Atrophie Blanche, Cyanosis, Ecchymosis, Hemosiderin Staining, Mottled, Pallor, Rubor, Erythema. Periwound temperature was noted as No Abnormality. Wound #2 status is Open. Original cause of wound was Gradually Appeared. The wound is located on the Right,Proximal Calcaneous. The wound measures 0.9cm length x 0.6cm width x 0.2cm depth; 0.424cm^2 area and 0.085cm^3 volume. The wound is limited to skin breakdown. There is no tunneling or undermining noted. There is a small amount of serous drainage noted. The wound margin is distinct with the outline attached to the wound base. There is small (1-33%) pink granulation within the wound bed. There is a small (1-33%) amount of necrotic tissue within the wound bed including Adherent Slough. The periwound skin appearance exhibited: Moist. The periwound skin appearance did not exhibit: Callus, Crepitus, Excoriation, Fluctuance, Friable, Induration, Localized Edema, Rash, Scarring, Dry/Scaly, Maceration, Atrophie Blanche, Cyanosis, Ecchymosis, Hemosiderin Staining, Mottled, Pallor, Rubor, Erythema. Periwound temperature was noted as No Abnormality. 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 Besides some symptomatic treatment and a couple of rounds of anti-biotic she has not had proper wound care for this. I have recommended Hydrofera Blue to be cut to size and applied with a foam border and have discussed offloading with her in great detail. She says she is going to be compliant and have discussed with her the need to take care of her blood sugar and complete her course of clindamycin. She states she is going to need proper orthotic diabetic shoes to prevent her from having such injuries  in the future. She says she is currently compliant and will see me back on a regular basis. Procedures Wound #1 Wound #1 is a Diabetic Wound/Ulcer of the Lower Extremity located on the Right,Distal Calcaneous . There was a Skin/Subcutaneous Tissue Debridement ([id number]) debridement with total area of 4 sq cm performed by Haeli Gerlich, Ignacia Felling., MD. with the following instrument(s): Curette to remove Viable and Non-Viable tissue/material including Exudate, Fibrin/Slough, Skin, Callus, and Subcutaneous after achieving pain control using Lidocaine 4% Topical Solution. A time out was conducted prior to the start of the procedure. A Minimum amount of bleeding was controlled with Pressure. The procedure was tolerated well with a pain level of 0 throughout and a pain level of 0 following the procedure. Post Debridement Measurements: 2cm length x 2cm width x 0.1cm depth; 0.314cm^3 volume. Wound #2 Wound #2 is a Diabetic Wound/Ulcer of the Lower Extremity located on the Right,Proximal Calcaneous . There was a Skin/Subcutaneous Tissue Debridement ([id number]) debridement with total area of 0.54 sq cm performed by Valeria Krisko, Ignacia Felling., MD. with the following instrument(s): Curette to remove Viable and Non- Viable tissue/material including Exudate, Fibrin/Slough, Skin,  Callus, and Subcutaneous after achieving pain control using Lidocaine 4% Topical Solution. A time out was conducted prior to the start of the procedure. A Minimum amount of bleeding was controlled with Pressure. The procedure was tolerated well with a pain level of 0 throughout and a pain level of 0 following the procedure. Post Debridement Measurements: 0.9cm length x 0.6cm width x 0.2cm depth; 0.085cm^3 volume. Plan Wound Cleansing: YUMALAY, CIRCLE N. ([id number]) Wound #1 Right,Distal Calcaneous: Clean wound with Normal Saline. Wound #2 Right,Proximal Calcaneous: Clean wound with Normal Saline. Anesthetic: Wound #1 Right,Distal Calcaneous: Topical Lidocaine 4% cream applied to wound bed prior to debridement Wound #2 Right,Proximal Calcaneous: Topical Lidocaine 4% cream applied to wound bed prior to debridement Skin Barriers/Peri-Wound Care: Wound #1 Right,Distal Calcaneous: Skin Prep Wound #2 Right,Proximal Calcaneous: Skin Prep Primary Wound Dressing: Wound #1 Right,Distal Calcaneous: Hydrafera Blue Wound #2 Right,Proximal Calcaneous: Hydrafera Blue Secondary Dressing: Wound #1 Right,Distal Calcaneous: Boardered Foam Dressing Wound #2 Right,Proximal Calcaneous: Boardered Foam Dressing Dressing Change Frequency: Wound #1 Right,Distal Calcaneous: Change dressing every other day. Wound #2 Right,Proximal Calcaneous: Change dressing every other day. Follow-up Appointments: Wound #1 Right,Distal Calcaneous: Return Appointment in 1 week. Wound #2 Right,Proximal Calcaneous: Return Appointment in 1 week. Off-Loading: Wound #1 Right,Distal Calcaneous: Open toe surgical shoe with peg assist. Wound #2 Right,Proximal Calcaneous: Open toe surgical shoe with peg assist. Pleasant 34 year old patient who has type 1 diabetes, hemodialysis dependent 3 times a week, diabetic nephropathy and diabetic neuropathy has had a Wagner grade 2 ulceration of her right heel for about  2 months. PETRINA, MELBY ([id number]) Besides some symptomatic treatment and a couple of rounds of anti-biotic she has not had proper wound care for this. I have recommended Hydrofera Blue to be cut to size and applied with a foam border and have discussed offloading with her in great detail. She says she is going to be compliant and have discussed with her the need to take care of her blood sugar and complete her course of clindamycin. She states she is going to need proper orthotic diabetic shoes to prevent her from having such injuries in the future. She says she is currently compliant and will see me back on a regular basis. Electronic Signature(s) Signed: 03/28/2015 1:47:03 PM By: [name] MD, FACS Entered By: [name] on 03/28/2015 13:08:01 [name] [name] ([id number]) --------------------------------------------------------------------------------  ROS/PFSH Details PETE, MERTEN 03/28/2015 11:15 Patient Name: Date of Service: N. AM Medical Record Patient Account Number: [id number] Number: Treating RN: May 28, 1981 (33 y.o. Other Clinician: Date of Birth/Sex: Female) Treating Lihanna Biever Primary Care Physician/Extender: [name] Physician: Referring Physician: [name] in Treatment: 0 Information Obtained From Patient Wound History Do you currently have one or more open woundso Yes How many open wounds do you currently haveo 1 Approximately how long have you had your woundso 17month How have you been treating your wound(s) until nowo dermapatch Has your wound(s) ever healed and then re-openedo No Have you had any lab work done in the past montho No Have you tested positive for an antibiotic resistant organism (MRSA, VRE)o No Have you tested positive for osteomyelitis (bone infection)o No Have you had any tests for circulation on your legso No Have you had other problems associated with your woundso Infection,  Swelling Eyes Complaints and Symptoms: Positive for: Vision Changes Medical History: Negative for: Cataracts; Glaucoma; Optic Neuritis Gastrointestinal Complaints and Symptoms: Positive for: Nausea Medical History: Negative for: Cirrhosis ; Colitis; Crohnos; Hepatitis A; Hepatitis B; Hepatitis C Past Medical History Notes: HX of nausea, vomiting gastroparesis Integumentary (Skin) Complaints and Symptoms: Positive for: Wounds; Swelling Cabreja, Mekayla N. ([id number]) Medical History: Negative for: History of Burn; History of pressure wounds Neurologic Complaints and Symptoms: Positive for: Numbness/parasthesias; Focal/Weakness Medical History: Positive for: Neuropathy Negative for: Dementia; Quadriplegia; Paraplegia; Seizure Disorder Constitutional Symptoms (General Health) Complaints and Symptoms: No Complaints or Symptoms Ear/Nose/Mouth/Throat Complaints and Symptoms: No Complaints or Symptoms Medical History: Negative for: Chronic sinus problems/congestion; Middle ear problems Hematologic/Lymphatic Complaints and Symptoms: No Complaints or Symptoms Medical History: Positive for: Anemia Respiratory Complaints and Symptoms: No Complaints or Symptoms Medical History: Negative for: Aspiration; Asthma; Pneumothorax; Sleep Apnea; Tuberculosis Cardiovascular Complaints and Symptoms: No Complaints or Symptoms Medical History: Negative for: Angina; Arrhythmia; Congestive Heart Failure; Coronary Artery Disease; Deep Vein Thrombosis; Hypertension; Hypotension; Myocardial Infarction; Peripheral Arterial Disease; Peripheral Venous Disease; Phlebitis; Vasculitis Past Medical History Notes: DEZTINY, SARRA. ([id number]) low blood pressure Endocrine Complaints and Symptoms: No Complaints or Symptoms Medical History: Positive for: Type I Diabetes - since ager 13 Time with diabetes: since age 49 years Treated with: Insulin Blood sugar tested every day: Yes Tested :  6 times a day Blood sugar testing results: Breakfast: 180-240 Genitourinary Complaints and Symptoms: No Complaints or Symptoms Medical History: Positive for: End Stage Renal Disease - for 4 years Immunological Complaints and Symptoms: No Complaints or Symptoms Medical History: Negative for: Lupus Erythematosus; Raynaudos; Scleroderma Musculoskeletal Complaints and Symptoms: No Complaints or Symptoms Medical History: Positive for: Rheumatoid Arthritis Negative for: Gout; Osteoarthritis; Osteomyelitis Oncologic Complaints and Symptoms: No Complaints or Symptoms Medical History: Negative for: Received Chemotherapy; Received Radiation Psychiatric NICA, FRISKE ([id number]) Complaints and Symptoms: No Complaints or Symptoms Medical History: Negative for: Anorexia/bulimia; Confinement Anxiety Past Medical History Notes: Insomnia Family and Social History Cancer: Yes - Maternal Grandparents; Diabetes: Yes - Maternal Grandparents, Paternal Grandparents; Heart Disease: Yes - Father; Hereditary Spherocytosis: No; Hypertension: Yes - Maternal Grandparents; Kidney Disease: No; Lung Disease: No; Seizures: No; Stroke: Yes - Paternal Grandparents; Thyroid Problems: Yes - Mother; Tuberculosis: No; Never smoker; Marital Status - Single; Alcohol Use: Never; Drug Use: No History; Caffeine Use: Daily - soda; Financial Concerns: No; Food, Clothing or Shelter Needs: No; Support System Lacking: No; Transportation Concerns: No; Advanced Directives: No; Patient does not want information on Advanced Directives; Living Will: No Physician Affirmation I have reviewed and  agree with the above information. Electronic Signature(s) Signed: 03/28/2015 1:47:03 PM By: [name] MD, FACS Entered By: [name] on 03/28/2015 13:05:23 [name] [name] ([id number]) -------------------------------------------------------------------------------- SuperBill Details Patient Name: [name] [name] Date of Service: 03/28/2015 Medical Record Number: [id number] Patient Account Number: [id number] Date of Birth/Sex: [birth year] (34 y.o. 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 Facility Procedures CPT4 Code Description: 21308657 99213 - WOUND CARE VISIT-LEV 3 EST PT Modifier: Quantity: 1 CPT4 Code Description: 84696295 11042 - DEB SUBQ TISSUE 20 SQ CM/< ICD-10 Description Diagnosis E10.621 Type 1 diabetes mellitus with foot ulcer L97.412 Non-pressure chronic ulcer of right heel and midfoot Modifier: with fat la Quantity: 1 yer exposed Physician Procedures CPT4 Code Description: 2841324 99204 - WC PHYS LEVEL 4 - NEW PT ICD-10 Description Diagnosis E10.621 Type 1 diabetes mellitus with foot ulcer L97.412 Non-pressure chronic ulcer of right heel and midfoo Modifier: t with fat lay Quantity: 1 er exposed CPT4 Code Description: 4010272 11042 - WC PHYS SUBQ TISS 20 SQ CM ICD-10 Description Diagnosis E10.621 Type 1 diabetes mellitus with foot ulcer L97.412 Non-pressure chronic ulcer of right heel and midfoo Modifier: t with fat lay Quantity: 1 er exposed Electronic Signature(s) Signed: 03/28/2015 1:47:03 PM By: [name] MD, FACS Entered By: [name] on 03/28/2015 13:08:23

## 2015-04-04 ENCOUNTER — Ambulatory Visit: Payer: Medicare Other | Admitting: Surgery

## 2015-04-09 ENCOUNTER — Ambulatory Visit: Payer: Medicare Other | Admitting: Surgery

## 2015-04-16 ENCOUNTER — Ambulatory Visit: Payer: Medicare Other | Admitting: Surgery

## 2015-04-23 ENCOUNTER — Ambulatory Visit: Payer: Medicare Other | Admitting: Surgery

## 2015-04-24 ENCOUNTER — Encounter: Payer: Self-pay | Admitting: General Surgery

## 2015-04-24 ENCOUNTER — Encounter: Payer: Medicare Other | Attending: Surgery | Admitting: General Surgery

## 2015-04-24 DIAGNOSIS — L97509 Non-pressure chronic ulcer of other part of unspecified foot with unspecified severity: Secondary | ICD-10-CM | POA: Diagnosis not present

## 2015-04-24 DIAGNOSIS — E1021 Type 1 diabetes mellitus with diabetic nephropathy: Secondary | ICD-10-CM | POA: Insufficient documentation

## 2015-04-24 DIAGNOSIS — Z992 Dependence on renal dialysis: Secondary | ICD-10-CM | POA: Diagnosis not present

## 2015-04-24 DIAGNOSIS — E10621 Type 1 diabetes mellitus with foot ulcer: Secondary | ICD-10-CM | POA: Diagnosis not present

## 2015-04-24 DIAGNOSIS — E1040 Type 1 diabetes mellitus with diabetic neuropathy, unspecified: Secondary | ICD-10-CM | POA: Insufficient documentation

## 2015-04-24 DIAGNOSIS — D649 Anemia, unspecified: Secondary | ICD-10-CM | POA: Diagnosis not present

## 2015-04-24 DIAGNOSIS — M069 Rheumatoid arthritis, unspecified: Secondary | ICD-10-CM | POA: Diagnosis not present

## 2015-04-24 DIAGNOSIS — N186 End stage renal disease: Secondary | ICD-10-CM | POA: Insufficient documentation

## 2015-04-24 DIAGNOSIS — L97412 Non-pressure chronic ulcer of right heel and midfoot with fat layer exposed: Secondary | ICD-10-CM | POA: Insufficient documentation

## 2015-04-24 NOTE — Progress Notes (Signed)
See i heal 

## 2015-04-25 ENCOUNTER — Ambulatory Visit: Payer: Medicare Other | Admitting: General Surgery

## 2015-04-25 NOTE — Progress Notes (Addendum)
[name] ([id number]) Visit Report for 04/24/2015 Chief Complaint Document Details [name], [name] 04/24/2015 1:00 Patient Name: Date of Service: N. PM Medical Record Patient Account Number: [id number] Number: Treating RN: Date of Birth/Sex: 1981/07/02 (33 y.o. Female) Other Clinician: Primary Care Physician: Juanita Craver, Marti Acebo Referring Physician: [name] Physician/Extender: Tania Ade in Treatment: 3 Information Obtained from: Patient Chief Complaint Patient presents to the wound care center for a consult due non healing wound. 34 year old patient with a nonhealing ulcer of the right heel for 2 months. also volar ulcer plantar lef great toe Treat with santyl. Electronic Signature(s) Signed: 04/24/2015 4:39:50 PM By: Ardath Sax MD Entered By: Ardath Sax on 04/24/2015 13:52:46 [name] ([id number]) -------------------------------------------------------------------------------- HPI Details [name], Wu 04/24/2015 1:00 Patient Name: Date of Service: N. PM Medical Record Patient Account Number: [id number] Number: Treating RN: Date of Birth/Sex: [birth year] (33 y.o. Female) Other Clinician: Primary Care Physician: Juanita Craver, Henryetta Corriveau Referring Physician: [name] Physician/Extender: Tania Ade in Treatment: 3 History of Present Illness Location: right heel ulcer Quality: Patient reports No Pain. Severity: Patient states wound are getting worse. Duration: Patient has had the wound for > 2 months prior to seeking treatment at the wound center Context: The wound occurred when the patient injured her foot on something sharp on her deck. Modifying Factors: Consults to this date include:antibiotics which included Keflex in about a month ago and now at present time she is on clindamycin. Associated Signs and Symptoms: Patient reports having difficulty standing for long periods. HPI  Description: [name] is a 34 y.o. female who presents to our wound center referred by her PCP Dr. Zada Finders for nonhealing ulcers on the lateral aspect of the right heel. The patient reports about 10 weeks ago she suffered an injury of the right heel. The wounds got infected, she was given Cephalexin and the use of Bactroban. Of note she has a history of type 1 diabetes mellitus that has been uncontrolled. Past medical history significant for type 1 diabetes mellitus not controlled, ankylosing spondylitis, anorexia nervosa, irritable bowel syndrome, chronic kidney disease, chronic diarrhea. She has been treated by her PCP with duoderm which is to be changed every 3 days. Her last hemoglobin A1c in March was 11. Electronic Signature(s) Signed: 04/24/2015 4:39:50 PM By: Ardath Sax MD Entered By: Ardath Sax on 04/24/2015 13:52:59 [name] ([id number]) -------------------------------------------------------------------------------- Physical Exam Details [name], Wu 04/24/2015 1:00 Patient Name: Date of Service: N. PM Medical Record Patient Account Number: [id number] Number: Treating RN: Date of Birth/Sex: 12-Oct-1980 (33 y.o. Female) Other Clinician: Primary Care Physician: Juanita Craver, Deshanae Lindo Referring Physician: [name] Physician/Extender: Tania Ade in Treatment: 3 Electronic Signature(s) Signed: 04/24/2015 4:39:50 PM By: Ardath Sax MD Entered By: Ardath Sax on 04/24/2015 13:53:23 [name] ([id number]) -------------------------------------------------------------------------------- Physician Orders Details [name] Date of Service: 04/24/2015 1:00 PM Patient Name: N. Patient Account Number: [id number] Medical Record [name], RN, [name], [id number] Treating RN: Number: [place] S[place] [birth year] (33 y.o. 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 Wound #2 Right,Proximal Calcaneous o Clean wound with Normal Saline.  Wound #3 Left,Plantar Toe Great o Clean wound with Normal Saline. Anesthetic Wound #1 Right,Distal Calcaneous o Topical Lidocaine 4% cream applied to wound bed prior to debridement Wound #2 Right,Proximal Calcaneous o Topical Lidocaine 4% cream applied to wound bed prior to debridement Skin Barriers/Peri-Wound Care Wound #1 Right,Distal Calcaneous o Skin Prep Wound #2 Right,Proximal Calcaneous o Skin Prep Primary Wound Dressing Wound #1 Right,Distal Calcaneous o Hydrafera Blue Wound #2 Right,Proximal Calcaneous Wu, [name] ([id number]) o Hydrafera Blue Wound #3 Left,Plantar Toe Great o Prisma Ag Secondary Dressing Wound #1 Right,Distal Calcaneous o Boardered Foam Dressing Wound #2 Right,Proximal Calcaneous o Boardered Foam Dressing Wound #3 Left,Plantar Toe Great o Gauze and Kerlix/Conform Dressing Change Frequency Wound #1 Right,Distal Calcaneous o Change dressing every other day. Wound #2 Right,Proximal Calcaneous o Change dressing every other day. Wound #3 Left,Plantar Toe Great o Change dressing every other day. Follow-up Appointments Wound #1 Right,Distal Calcaneous o Return Appointment in 1 week. Wound #2 Right,Proximal Calcaneous o Return Appointment in 1 week. Off-Loading Wound #1 Right,Distal Calcaneous o Open toe surgical shoe with peg assist. Wound #2 Right,Proximal Calcaneous o Open toe surgical shoe  with peg assist. Electronic Signature(s) Signed: 04/24/2015 4:34:46 PM By: Elpidio Eric [name], RN Entered By: Elpidio Eric on 04/24/2015 13:58:10 [name] ([id number]) -------------------------------------------------------------------------------- Problem List Details [name] 04/24/2015 1:00 Patient Name: Date of Service: N. PM Medical Record Patient Account Number: [id number] Number: Treating RN: Date of Birth/Sex: Jan 24, 1981 (33 y.o. Female) Other Clinician: Primary Care Physician: Juanita Craver, Marcile Fuquay Referring Physician: [name] Physician/Extender: Tania Ade in Treatment: 3 Active Problems ICD-10 Encounter Code Description Active Date Diagnosis E10.621 Type 1 diabetes mellitus with foot ulcer 03/28/2015 Yes L97.412 Non-pressure chronic ulcer of right heel and midfoot with 03/28/2015 Yes fat layer exposed Inactive Problems Resolved Problems Electronic Signature(s) Signed: 04/24/2015 4:39:50 PM By: Ardath Sax MD Entered By: Ardath Sax on 04/24/2015 13:50:00 [name] ([id number]) -------------------------------------------------------------------------------- Progress Note Details Cornwall, Markitta 04/24/2015 1:00 Patient Name: Date of Service: N. PM Medical Record Patient Account Number: [id number] Number: Treating RN: Date of Birth/Sex: 1981-09-25 (33 y.o. Female) Other Clinician: Primary Care Physician: Juanita Craver, Argil Mahl Referring Physician: [name] Physician/Extender: Tania Ade in Treatment: 3 Subjective Chief Complaint Information obtained from Patient Patient presents to the wound care center for a consult due non healing wound. 34 year old patient with a nonhealing ulcer of the right heel for 2 months. also volar ulcer plantar lef great toe Treat with santyl. History of Present Illness (HPI) The following HPI elements were documented for the patient's  wound: Location: right heel ulcer Quality: Patient reports No Pain. Severity: Patient states wound are getting worse. Duration: Patient has had the wound for > 2 months prior to seeking treatment at the wound center Context: The wound occurred when the patient injured her foot on something sharp on her deck. Modifying Factors: Consults to this date include:antibiotics which included Keflex in about a month ago and now at present time she is on clindamycin. Associated Signs and Symptoms: Patient reports having difficulty standing for long periods. Mliss Wedin is a 34 y.o. female who presents to our wound center referred by her PCP Dr. Zada Finders for nonhealing ulcers on the lateral aspect of the right heel. The patient reports about 10 weeks ago she suffered an injury of the right heel. The wounds got infected, she was given Cephalexin and the use of Bactroban. Of note she has a history of type 1 diabetes mellitus that has been  uncontrolled. Past medical history significant for type 1 diabetes mellitus not controlled, ankylosing spondylitis, anorexia nervosa, irritable bowel syndrome, chronic kidney disease, chronic diarrhea. She has been treated by her PCP with duoderm which is to be changed every 3 days. Her last hemoglobin A1c in March was 11. Objective Constitutional Vitals Time Taken: 1:10 PM, Height: 68 in, Weight: 122 lbs, BMI: 18.5, Temperature: 97.8 F, Pulse: 72 Blackson, Trinty N. ([id number]) bpm, Respiratory Rate: 17 breaths/min, Blood Pressure: 78/50 mmHg. Integumentary (Hair, Skin) Wound #1 status is Open. Original cause of wound was Gradually Appeared. The wound is located on the Right,Distal Calcaneous. The wound measures 1cm length x 1cm width x 0.2cm depth; 0.785cm^2 area and 0.157cm^3 volume. The wound is limited to skin breakdown. There is no tunneling or undermining noted. There is a small amount of serous drainage noted. The wound margin is distinct with the  outline attached to the wound base. There is medium (34-66%) pink granulation within the wound bed. There is a small (1-33%) amount of necrotic tissue within the wound bed including Adherent Slough. The periwound skin appearance exhibited: Moist. The periwound skin appearance did not exhibit: Callus, Crepitus, Excoriation, Fluctuance, Friable, Induration, Localized Edema, Rash, Scarring, Dry/Scaly, Maceration, Atrophie Blanche, Cyanosis, Ecchymosis, Hemosiderin Staining, Mottled, Pallor, Rubor, Erythema. Periwound temperature was noted as No Abnormality. Wound #2 status is Open. Original cause of wound was Gradually Appeared. The wound is located on the Right,Proximal Calcaneous. The wound measures 0.4cm length x 0.6cm width x 0.1cm depth; 0.188cm^2 area and 0.019cm^3 volume. The wound is limited to skin breakdown. There is no tunneling or undermining noted. There is a small amount of serous drainage noted. The wound margin is distinct with the outline attached to the wound base. There is small (1-33%) pink granulation within the wound bed. There is a small (1-33%) amount of necrotic tissue within the wound bed including Adherent Slough. The periwound skin appearance exhibited: Moist. The periwound skin appearance did not exhibit: Callus, Crepitus, Excoriation, Fluctuance, Friable, Induration, Localized Edema, Rash, Scarring, Dry/Scaly, Maceration, Atrophie Blanche, Cyanosis, Ecchymosis, Hemosiderin Staining, Mottled, Pallor, Rubor, Erythema. Periwound temperature was noted as No Abnormality. Assessment Active Problems ICD-10 E10.621 - Type 1 diabetes mellitus with foot ulcer L97.412 - Non-pressure chronic ulcer of right heel and midfoot with fat layer exposed Diabetic and dialysis patient with 2ulcers. R heel andunder left great Will continue treating withsilver alginate. Plan Wound Cleansing: Wound #1 Right,Distal Calcaneous: Derryberry, Consuela N. ([id number]) Clean wound with Normal  Saline. Wound #2 Right,Proximal Calcaneous: Clean wound with Normal Saline. Wound #3 Left,Plantar Toe Great: Clean wound with Normal Saline. Anesthetic: Wound #1 Right,Distal Calcaneous: Topical Lidocaine 4% cream applied to wound bed prior to debridement Wound #2 Right,Proximal Calcaneous: Topical Lidocaine 4% cream applied to wound bed prior to debridement Skin Barriers/Peri-Wound Care: Wound #1 Right,Distal Calcaneous: Skin Prep Wound #2 Right,Proximal Calcaneous: Skin Prep Primary Wound Dressing: Wound #1 Right,Distal Calcaneous: Hydrafera Blue Wound #2 Right,Proximal Calcaneous: Hydrafera Blue Wound #3 Left,Plantar Toe Great: Prisma Ag Secondary Dressing: Wound #1 Right,Distal Calcaneous: Boardered Foam Dressing Wound #2 Right,Proximal Calcaneous: Boardered Foam Dressing Wound #3 Left,Plantar Toe Great: Gauze and Kerlix/Conform Dressing Change Frequency: Wound #1 Right,Distal Calcaneous: Change dressing every other day. Wound #2 Right,Proximal Calcaneous: Change dressing every other day. Wound #3 Left,Plantar Toe Great: Change dressing every other day. Follow-up Appointments: Wound #1 Right,Distal Calcaneous: Return Appointment in 1 week. Wound #2 Right,Proximal Calcaneous: Return Appointment in 1 week. Off-Loading: Wound #1 Right,Distal Calcaneous: Open toe surgical shoe with peg assist.  Wound #2 Right,Proximal Calcaneous: Open toe surgical shoe with peg assist. TAKECIA, DEGRAW ([id number]) Electronic Signature(s) Signed: 05/07/2015 3:22:18 PM By: Elliot Gurney RN, [name], Kim RN, [name] Previous Signature: 04/24/2015 4:39:50 PM Version By: Ardath Sax MD Entered By: Elliot Gurney, RN, [name], Kim on 05/07/2015 15:22:18 [name] ([id number]) -------------------------------------------------------------------------------- SuperBill Details Patient Name: [name] Date of Service: 04/24/2015 Medical Record Number: [id number] Patient Account Number:  [id number] Date of Birth/Sex: 07-29-1981 (34 y.o. 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

## 2015-04-25 NOTE — Progress Notes (Signed)
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 Induration: No Mottled: No Localized Edema: No Pallor: No Rash: No Rubor: No Scarring: No Temperature / Pain Moisture Temperature: No Abnormality No Abnormalities Noted: No Dry / Scaly: Yes Maceration: No Moist: No Wound Preparation Ulcer Cleansing: Rinsed/Irrigated with Saline Topical Anesthetic Applied: None Treatment  Notes Wound #3 (Left, Plantar Toe Great) 1. Cleansed with: Clean wound with Normal Saline 4. Dressing Applied: Prisma Ag 5. Secondary Dressing Applied Gauze and Kerlix/Conform 7. Secured with Secretary/administrator) Signed: 04/24/2015 4:34:46 PM By: Elpidio Eric [name], RN Entered By: Elpidio Eric on 04/24/2015 13:55:27 [name] ([id number]) -------------------------------------------------------------------------------- Vitals Details [name] Date of Service: 04/24/2015 1:00 PM Patient Name: N. Patient Account Number: [id number] Medical Record [name], RN, [name], [id number] Treating RN: Number: [place] Date of Birth/Sex: 07-07-1981 (33 y.o. Female) Other Clinician: Primary Care Physician: Juanita Craver, PETER Referring Physician: [name] Physician/Extender: Tania Ade in Treatment: 3 Vital Signs Time Taken: 13:10 Temperature (F): 97.8 Height (in): 68 Pulse (bpm): 72 Weight (lbs): 122 Respiratory Rate (breaths/min): 17 Body Mass Index (BMI): 18.5 Blood Pressure (mmHg): 78/50 Reference Range: 80 - 120 mg / dl Electronic Signature(s) Signed: 04/24/2015 4:34:46 PM By: Elpidio Eric [name], RN Entered By: Elpidio Eric on 04/24/2015 13:11:10

## 2015-05-01 ENCOUNTER — Ambulatory Visit: Payer: Medicare Other | Admitting: Surgery

## 2015-05-09 ENCOUNTER — Encounter: Payer: Medicare Other | Attending: Surgery | Admitting: Surgery

## 2015-05-09 DIAGNOSIS — E10621 Type 1 diabetes mellitus with foot ulcer: Secondary | ICD-10-CM | POA: Insufficient documentation

## 2015-05-09 DIAGNOSIS — L97412 Non-pressure chronic ulcer of right heel and midfoot with fat layer exposed: Secondary | ICD-10-CM | POA: Diagnosis not present

## 2015-05-09 DIAGNOSIS — L97429 Non-pressure chronic ulcer of left heel and midfoot with unspecified severity: Secondary | ICD-10-CM | POA: Insufficient documentation

## 2015-05-09 DIAGNOSIS — L03116 Cellulitis of left lower limb: Secondary | ICD-10-CM | POA: Diagnosis not present

## 2015-05-09 DIAGNOSIS — N189 Chronic kidney disease, unspecified: Secondary | ICD-10-CM | POA: Diagnosis not present

## 2015-05-10 NOTE — Progress Notes (Addendum)
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 1. Cleansed with: Clean wound with Normal Saline 2. Anesthetic Topical Lidocaine 4% cream to wound bed prior to debridement Notes open to air until seen by PCP Electronic Signature(s) Signed: 05/09/2015 5:44:32 PM By: [name] Entered By: [name] on 05/09/2015 12:33:26 [name] ([id number]) -------------------------------------------------------------------------------- Wound Assessment Details [name] Date of Service: 05/09/2015 11:45 AM Patient Name: N. Patient Account  Number: [id number] Medical Record Treating RN: [name] [id number] Number: Other Clinician: Date of Birth/Sex: 03/05/1981 (33 y.o. Female) Treating BURNS III, Primary Care Physician: [name] Physician/Extender: Zollie Beckers Referring Physician: [name] in Treatment: 6 Wound Status Wound Number: 4 Primary Diabetic Wound/Ulcer of the Lower Etiology: Extremity Wound Location: Left Foot - Dorsal Wound Open Wounding Event: Gradually Appeared Status: Date Acquired: 04/29/2015 Comorbid Anemia, Type I Diabetes, End Stage Weeks Of Treatment: 0 History: Renal Disease, Rheumatoid Arthritis, Clustered Wound: No Neuropathy Photos Photo Uploaded By: [name] on 05/09/2015 17:36:09 Wound Measurements Length: (cm) 0.4 Width: (cm) 0.3 Depth: (cm) 0.1 Area: (cm) 0.094 Volume: (cm) 0.009 % Reduction in Area: 0% % Reduction in Volume: 0% Epithelialization: None Tunneling: No Undermining: No Wound Description Classification: Grade 1 Foul Odor After Wound Margin: Flat and Intact Exudate Amount: None Present Cleansing: No Wound Bed Granulation Amount: None Present (0%) Exposed Structure Necrotic Amount: Large (67-100%) Fascia Exposed: No Necrotic Quality: Eschar Fat Layer Exposed: No Tendon Exposed: No [place], Tracee N. ([id number]) Muscle Exposed: No Joint Exposed: No Bone Exposed: No Limited to Skin Breakdown Periwound Skin Texture Texture Color No Abnormalities Noted: No No Abnormalities Noted: No Callus: No Atrophie Blanche: No Crepitus: No Cyanosis: No Excoriation: No Ecchymosis: No Fluctuance: No Erythema: No Friable: No Hemosiderin Staining: No Induration: No Mottled: No Localized Edema: No Pallor: No Rash: No Rubor: No Scarring:  No Temperature / Pain Moisture Temperature: No Abnormality No Abnormalities Noted: No Dry / Scaly: No Maceration: No Moist: No Wound Preparation Ulcer Cleansing: Rinsed/Irrigated with Saline Topical  Anesthetic Applied: Other: idocaine 4%, Treatment Notes Wound #4 (Left, Dorsal Foot) 1. Cleansed with: Clean wound with Normal Saline 2. Anesthetic Topical Lidocaine 4% cream to wound bed prior to debridement Notes open to air until seen by PCP Electronic Signature(s) Signed: 05/09/2015 5:44:32 PM By: [name] Entered By: [name] on 05/09/2015 12:34:14 [name] ([id number]) -------------------------------------------------------------------------------- Vitals Details AREESHA, DEHAVEN 05/09/2015 11:45 Patient Name: Date of Service: [name] AM Medical Record Patient Account Number: [id number] Number: Treating RN: [name] Date of Birth/Sex: [birth year] (33 y.o. Female) Other Clinician: Primary Care Physician: [name] Treating [name] Referring Physician: [name] Physician/Extender: Tania Ade in Treatment: 6 Vital Signs Time Taken: 12:10 Temperature (F): 98.1 Height (in): 68 Pulse (bpm): 89 Weight (lbs): 122 Respiratory Rate (breaths/min): 18 Body Mass Index (BMI): 18.5 Blood Pressure (mmHg): 105/73 Reference Range: 80 - 120 mg / dl Electronic Signature(s) Signed: 05/15/2015 5:06:50 PM By: Elliot Gurney RN, [name], Kim RN, [name] Previous Signature: 05/09/2015 5:44:32 PM Version By: [name] Entered By: Elliot Gurney RN, [name], Kim on 05/15/2015 16:38:30

## 2015-05-16 ENCOUNTER — Ambulatory Visit: Payer: Medicare Other | Admitting: Surgery

## 2015-05-17 NOTE — Progress Notes (Signed)
[name], [name] ([id number]) Visit Report for 05/09/2015 Chief Complaint Document Details LOURAINE, HERBST 05/09/2015 11:45 Patient Name: Date of Service: N. AM Medical Record Patient Account Number: [id number] Number: Treating RN: [name] 1981-04-09 (33 y.o. Other Clinician: Date of Birth/Sex: Female) Treating Briar Witherspoon Primary Care Physician/Extender: [name] Physician: Referring Physician: [name] in Treatment: 6 Information Obtained from: Patient Chief Complaint Patient presents to the wound care center for a consult due non healing wound. 34 year old patient with a nonhealing ulcer of the right heel for 2 months. also volar ulcer plantar lef great toe Treat with santyl. Electronic Signature(s) Signed: 05/09/2015 1:04:29 PM By: [name] MD, FACS Entered By: [name] on 05/09/2015 13:04:29 [name] ([id number]) -------------------------------------------------------------------------------- Debridement Details CYTHINA, HERSHNER 05/09/2015 11:45 Patient Name: Date of Service: Dorris Carnes AM Medical Record Patient Account Number: [id number] Number: Treating RN: [name] 06/25/1981 (33 y.o. Other Clinician: Date of Birth/Sex: Female) Treating Liandra Mendia Primary Care Physician/Extender: [name] Physician: Referring Physician: [name] in Treatment: 6 Debridement Performed for Wound #1 Right,Distal Calcaneous Assessment: Performed By: Physician BURNS III, Melanie Crazier., MD Debridement: Debridement Pre-procedure Yes Verification/Time Out Taken: Start Time: 12:32 Pain Control: Lidocaine 4% Topical Solution Level: Skin/Subcutaneous Tissue Total Area Debrided (L x 0.2 (cm) x 0.3 (cm) = 0.06 (cm) W): Tissue and other Viable, Non-Viable, Fibrin/Slough, Subcutaneous material debrided: Instrument: Curette Bleeding: Moderate Hemostasis Achieved: Pressure End Time:  12:33 Procedural Pain: 0 Post Procedural Pain: 0 Response to Treatment: Procedure was tolerated well Post Debridement Measurements of Total Wound Length: (cm) 0.2 Width: (cm) 0.3 Depth: (cm) 0.1 Volume: (cm) 0.005 Electronic Signature(s) Signed: 05/09/2015 1:03:44 PM By: [name] MD, FACS Signed: 05/09/2015 5:44:32 PM By: [name] Entered By: [name] on 05/09/2015 13:03:43 [name] ([id number]) -------------------------------------------------------------------------------- Debridement Details ELFRIDA, LASHWAY 05/09/2015 11:45 Patient Name: Date of Service: Dorris Carnes AM Medical Record Patient Account Number: [id number] Number: Treating RN: [name] 1980/12/26 (33 y.o. Other Clinician: Date of Birth/Sex: Female) Treating Jerriyah Louis Primary Care Physician/Extender: [name] Physician: Referring Physician: [name] in Treatment: 6 Debridement Performed for Wound #2 Right,Proximal Calcaneous Assessment: Performed By: Physician BURNS III, Melanie Crazier., MD Debridement: Debridement Pre-procedure Yes Verification/Time Out Taken: Start Time: 12:33 Pain Control: Lidocaine 4% Topical Solution Level: Skin/Subcutaneous Tissue Total Area Debrided (L x 0.8 (cm) x 0.8 (cm) = 0.64 (cm) W): Tissue and other Viable, Non-Viable, Fibrin/Slough, Subcutaneous material debrided: Instrument: Curette Bleeding: Moderate Hemostasis Achieved: Pressure End Time: 12:35 Procedural Pain: 0 Post Procedural Pain: 0 Response to Treatment: Procedure was tolerated well Post Debridement Measurements of Total Wound Length: (cm) 0.8 Width: (cm) 0.8 Depth: (cm) 0.1 Volume: (cm) 0.05 Electronic Signature(s) Signed: 05/09/2015 1:03:52 PM By: [name] MD, FACS Signed: 05/09/2015 5:44:32 PM By: [name] Entered By: [name] on 05/09/2015 13:03:52 [name]  ([id number]) -------------------------------------------------------------------------------- HPI Details KATH, LAMOND 05/09/2015 11:45 Patient Name: Date of Service: Dorris Carnes AM Medical Record Patient Account Number: [id number] Number: Treating RN: [name] 1981-03-10 (33 y.o. Other Clinician: Date of Birth/Sex: Female) Treating Vannary Greening Primary Care Physician/Extender: [name] Physician: Referring Physician: [name] in Treatment: 6 History of Present Illness Location: right heel ulcer Quality: Patient reports No Pain. Severity: Patient states wound are getting worse. Duration: Patient has had the wound for > 2 months prior to seeking treatment at the wound center Context: The wound occurred when the patient injured her foot on something sharp on her deck. Modifying Factors: Consults to  this date include:antibiotics which included Keflex in about a month ago and now at present time she is on clindamycin. Associated Signs and Symptoms: Patient reports having difficulty standing for long periods. HPI Description: Finn Altemose is a 34 y.o. female who presents to our wound center referred by her PCP Dr. Zada Finders for nonhealing ulcers on the lateral aspect of the right heel. The patient reports about 10 weeks ago she suffered an injury of the right heel. The wounds got infected, she was given Cephalexin and the use of Bactroban. Of note she has a history of type 1 diabetes mellitus that has been uncontrolled. Past medical history significant for type 1 diabetes mellitus not controlled, ankylosing spondylitis, anorexia nervosa, irritable bowel syndrome, chronic kidney disease, chronic diarrhea. She has been treated by her PCP with duoderm which is to be changed every 3 days. Her last hemoglobin A1c in March was 11. 05/08/2015 -- the patient was seen here once on 03/28/2015 and then once in 04/17/2015. He has been very irregular with her  appointments for various reasons. She says about 2 weeks ago she was in a swimming pool and she injured her left big toe and had a blister which she started picking at. She says she's been applying some allergen over this. She's noticed that her toes on this foot and the skin over the dorsum of forefoot have turned red. She does not have any fever or any other significant signs of infection. Addendum: after seeing the patient I have personally spoken to her PCP Dr. [name] and discussed the recent cellulitis she's developed and recommended that she get IV antibiotics and inpatient care for a few days. Electronic Signature(s) Signed: 05/09/2015 1:27:39 PM By: [name] MD, FACS Previous Signature: 05/09/2015 1:06:10 PM Version By: [name] MD, FACS Entered By: [name] on 05/09/2015 13:27:39 [name] ([id number]) -------------------------------------------------------------------------------- Physical Exam Details TMYA, WIGINGTON 05/09/2015 11:45 Patient Name: Date of Service: Dorris Carnes AM Medical Record Patient Account Number: [id number] Number: Treating RN: [name] [birth year] (33 y.o. Other Clinician: Date of Birth/Sex: Female) Treating Davius Goudeau Primary Care Physician/Extender: [name] Physician: Referring Physician: [name] in Treatment: 6 Constitutional . Pulse regular. Respirations normal and unlabored. Afebrile. . Eyes Nonicteric. Reactive to light. Ears, Nose, Mouth, and Throat Lips, teeth, and gums WNL.Marland Kitchen Moist mucosa without lesions . Neck supple and nontender. No palpable supraclavicular or cervical adenopathy. Normal sized without goiter. Respiratory WNL. No retractions.. Cardiovascular Pedal Pulses WNL. No clubbing, cyanosis or edema. Chest Breasts symmetical and no nipple discharge.. Breast tissue WNL, no masses, lumps, or tenderness.. Lymphatic No adneopathy. No adenopathy. No  adenopathy. Musculoskeletal Adexa without tenderness or enlargement.. Digits and nails w/o clubbing, cyanosis, infection, petechiae, ischemia, or inflammatory conditions.. Integumentary (Hair, Skin) No suspicious lesions. No crepitus or fluctuance. No peri-wound warmth or erythema. No masses.Marland Kitchen Psychiatric Judgement and insight Intact.. No evidence of depression, anxiety, or agitation.. Notes The right calcaneum has two open ulcers and they have some slough which need sharp debridement. On the plantar aspect of her left big toe she has a dry eschar which is almost occupying the entire toe. There is no purulence and there is some tenderness. Her toes have a erythema and there is a streak going up the dorsum of forefoot towards her lower extremity. Clinically this looks like a cellulitis. Electronic Signature(s) Signed: 05/09/2015 1:08:56 PM By: [name] MD, FACS [place], Ledell Peoples ([id number]) Entered By: [name] on 05/09/2015 13:08:55 [name] ([id number]) --------------------------------------------------------------------------------  Physician Orders Details KENNY, STERN 05/09/2015 11:45 Patient Name: Date of Service: N. AM Medical Record Patient Account Number: [id number] Number: Treating RN: [name] 23-Jan-1981 (33 y.o. Other Clinician: Date of Birth/Sex: Female) Treating Nyjah Schwake Primary Care Physician/Extender: [name] Physician: Referring Physician: [name] in Treatment: 6 Verbal / Phone Orders: Yes Clinician: [name] Read Back and Verified: Yes Diagnosis Coding Wound Cleansing Wound #1 Right,Distal Calcaneous o Clean wound with Normal Saline. Wound #2 Right,Proximal Calcaneous o Clean wound with Normal Saline. Wound #3 Left,Plantar Toe Great o Clean wound with Normal Saline. Wound #4 Left,Dorsal Foot o Clean wound with Normal Saline. Anesthetic Wound #1 Right,Distal  Calcaneous o Topical Lidocaine 4% cream applied to wound bed prior to debridement Wound #2 Right,Proximal Calcaneous o Topical Lidocaine 4% cream applied to wound bed prior to debridement Wound #3 Left,Plantar Toe Great o Topical Lidocaine 4% cream applied to wound bed prior to debridement Wound #4 Left,Dorsal Foot o Topical Lidocaine 4% cream applied to wound bed prior to debridement Skin Barriers/Peri-Wound Care Wound #1 Right,Distal Calcaneous o Skin Prep Wound #2 Right,Proximal Calcaneous o Skin Prep Hannay, Reylynn N. ([id number]) Wound #3 Left,Plantar Toe Great o Skin Prep Wound #4 Left,Dorsal Foot o Skin Prep Primary Wound Dressing Wound #1 Right,Distal Calcaneous o Aquacel Ag Wound #2 Right,Proximal Calcaneous o Aquacel Ag Secondary Dressing Wound #1 Right,Distal Calcaneous o Boardered Foam Dressing Wound #2 Right,Proximal Calcaneous o Boardered Foam Dressing Dressing Change Frequency Wound #1 Right,Distal Calcaneous o Change dressing every other day. Wound #2 Right,Proximal Calcaneous o Change dressing every other day. Follow-up Appointments Wound #1 Right,Distal Calcaneous o Return Appointment in 1 week. Wound #2 Right,Proximal Calcaneous o Return Appointment in 1 week. Wound #3 Left,Plantar Toe Great o Return Appointment in 1 week. Wound #4 Left,Dorsal Foot o Return Appointment in 1 week. Off-Loading Wound #1 Right,Distal Calcaneous o Open toe surgical shoe with peg assist. Wound #2 Right,Proximal Calcaneous o Open toe surgical shoe with peg assist. Macconnell, Brisia N. ([id number]) Wound #3 Left,Plantar Toe Great o Open toe surgical shoe with peg assist. Wound #4 Left,Dorsal Foot o Open toe surgical shoe with peg assist. Notes Go to your PCP for infection. Leave left foot wounds open to air at this time until seen by PCP Electronic Signature(s) Signed: 05/15/2015 5:06:50 PM By: Elliot Gurney RN, [name], Kim RN,  [name] Signed: 05/16/2015 4:32:04 PM By: [name] MD, FACS Previous Signature: 05/09/2015 5:44:32 PM Version By: [name] Entered By: Elliot Gurney RN, [name], Kim on 05/15/2015 16:39:58 MAKYNZI, EASTLAND ([id number]) -------------------------------------------------------------------------------- Problem List Details LESHONDA, GALAMBOS 05/09/2015 11:45 Patient Name: Date of Service: N. AM Medical Record Patient Account Number: [id number] Number: Treating RN: [name] 06/27/1981 (33 y.o. 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 AM Medical Record Patient Account Number:  [id number] Number: Treating RN: [name] 20-Apr-1981 (33 y.o. Other Clinician: Date of Birth/Sex: Female) Treating Kilynn Fitzsimmons Primary Care Physician/Extender: [name] Physician: Referring Physician: [name] in Treatment: 6 Subjective Chief Complaint Information obtained from  Patient Patient presents to the wound care center for a consult due non healing wound. 34 year old patient with a nonhealing ulcer of the right heel for 2 months. also volar ulcer plantar lef great toe Treat with santyl. History of Present Illness (HPI) The following HPI elements were documented for the patient's wound: Location: right heel ulcer Quality: Patient reports No Pain. Severity: Patient states wound are getting worse. Duration: Patient has had the wound for > 2 months prior to seeking treatment at the wound center Context: The wound occurred when the patient injured her foot on something sharp on her deck. Modifying Factors: Consults to this date include:antibiotics which included Keflex in about a month ago and now at present time she is on clindamycin. Associated Signs and Symptoms: Patient reports having difficulty standing for long periods. Charice Kagle is a 34 y.o. female who presents to our wound center referred by her PCP Dr. Zada Finders for nonhealing ulcers on the lateral aspect of the right heel. The patient reports about 10 weeks ago she suffered an injury of the right heel. The wounds got infected, she was given Cephalexin and the use of Bactroban. Of note she has a history of type 1 diabetes mellitus that has been uncontrolled. Past medical history significant for type 1 diabetes mellitus not controlled, ankylosing spondylitis, anorexia nervosa, irritable bowel syndrome, chronic kidney disease, chronic diarrhea. She has been treated by her PCP with duoderm which is to be changed every 3 days. Her last hemoglobin A1c in March was 11. 05/08/2015 -- the patient was seen here once on 03/28/2015 and then once in 04/17/2015. He has been very irregular with her appointments for various reasons. She says about 2 weeks ago she was in a swimming pool and she injured her left big toe and had a blister which she started picking at. She says she's been applying some allergen over  this. She's noticed that her toes on this foot and the skin over the dorsum of forefoot have turned red. She does not have any fever or any other significant signs of infection. Addendum: after seeing the patient I have personally spoken to her PCP Dr. [name] and discussed the recent cellulitis she's developed and recommended that she get IV antibiotics and inpatient care for a few days. ARELYS, ARABIE ([id number]) Objective Constitutional Pulse regular. Respirations normal and unlabored. Afebrile. Vitals Time Taken: 12:10 PM, Height: 68 in, Weight: 122 lbs, BMI: 18.5, Temperature: 98.1 F, Pulse: 89 bpm, Respiratory Rate: 18 breaths/min, Blood Pressure: 105/73 mmHg. Eyes Nonicteric. Reactive to light. Ears, Nose, Mouth, and Throat Lips, teeth, and gums WNL.Marland Kitchen Moist mucosa without lesions . Neck supple and nontender. No palpable supraclavicular or cervical adenopathy. Normal sized without goiter. Respiratory WNL. No retractions.. Cardiovascular Pedal Pulses WNL. No clubbing, cyanosis or edema. Chest Breasts symmetical and no nipple discharge.. Breast tissue WNL, no masses, lumps, or tenderness.. Lymphatic No adneopathy. No adenopathy. No adenopathy. Musculoskeletal Adexa without tenderness or enlargement.. Digits and nails w/o clubbing, cyanosis, infection, petechiae, ischemia, or inflammatory conditions.Marland Kitchen Psychiatric Judgement and insight Intact.. No evidence of depression, anxiety, or agitation.. General Notes: The right calcaneum has two open ulcers and they have some slough which need sharp debridement. On the plantar aspect of her left big toe she has  a dry eschar which is almost occupying the entire toe. There is no purulence and there is some tenderness. Her toes have a erythema and there is a streak going up the dorsum of forefoot towards her lower extremity. Clinically this looks like a cellulitis. GINELLE, BAYS ([id number]) Integumentary (Hair,  Skin) No suspicious lesions. No crepitus or fluctuance. No peri-wound warmth or erythema. No masses.. Wound #1 status is Open. Original cause of wound was Gradually Appeared. The wound is located on the Right,Distal Calcaneous. The wound measures 0.2cm length x 0.3cm width x 0.1cm depth; 0.047cm^2 area and 0.005cm^3 volume. The wound is limited to skin breakdown. There is no tunneling or undermining noted. There is a small amount of serous drainage noted. The wound margin is distinct with the outline attached to the wound base. There is medium (34-66%) pink granulation within the wound bed. There is a medium (34-66%) amount of necrotic tissue within the wound bed including Adherent Slough. The periwound skin appearance exhibited: Moist. The periwound skin appearance did not exhibit: Callus, Crepitus, Excoriation, Fluctuance, Friable, Induration, Localized Edema, Rash, Scarring, Dry/Scaly, Maceration, Atrophie Blanche, Cyanosis, Ecchymosis, Hemosiderin Staining, Mottled, Pallor, Rubor, Erythema. Periwound temperature was noted as No Abnormality. Wound #2 status is Open. Original cause of wound was Gradually Appeared. The wound is located on the Right,Proximal Calcaneous. The wound measures 0.8cm length x 0.8cm width x 0.1cm depth; 0.503cm^2 area and 0.05cm^3 volume. The wound is limited to skin breakdown. There is no tunneling or undermining noted. There is a small amount of serous drainage noted. The wound margin is distinct with the outline attached to the wound base. There is small (1-33%) pink granulation within the wound bed. There is a large (67-100%) amount of necrotic tissue within the wound bed including Adherent Slough. The periwound skin appearance exhibited: Moist. The periwound skin appearance did not exhibit: Callus, Crepitus, Excoriation, Fluctuance, Friable, Induration, Localized Edema, Rash, Scarring, Dry/Scaly, Maceration, Atrophie Blanche, Cyanosis, Ecchymosis, Hemosiderin  Staining, Mottled, Pallor, Rubor, Erythema. Periwound temperature was noted as No Abnormality. Wound #3 status is Open. Original cause of wound was Shear/Friction. The wound is located on the [employer]. The wound measures 2.4cm length x 1.8cm width x 0.1cm depth; 3.393cm^2 area and 0.339cm^3 volume. The wound is limited to skin breakdown. There is no tunneling or undermining noted. There is a none present amount of drainage noted. The wound margin is distinct with the outline attached to the wound base. There is large (67-100%) red granulation within the wound bed. There is no necrotic tissue within the wound bed. The periwound skin appearance exhibited: Dry/Scaly. The periwound skin appearance did not exhibit: Callus, Crepitus, Excoriation, Fluctuance, Friable, Induration, Localized Edema, Rash, Scarring, Maceration, Moist, Atrophie Blanche, Cyanosis, Ecchymosis, Hemosiderin Staining, Mottled, Pallor, Rubor, Erythema. Periwound temperature was noted as No Abnormality. Wound #4 status is Open. Original cause of wound was Gradually Appeared. The wound is located on the Left,Dorsal Foot. The wound measures 0.4cm length x 0.3cm width x 0.1cm depth; 0.094cm^2 area and 0.009cm^3 volume. The wound is limited to skin breakdown. There is no tunneling or undermining noted. There is a none present amount of drainage noted. The wound margin is flat and intact. There is no granulation within the wound bed. There is a large (67-100%) amount of necrotic tissue within the wound bed including Eschar. The periwound skin appearance did not exhibit: Callus, Crepitus, Excoriation, Fluctuance, Friable, Induration, Localized Edema, Rash, Scarring, Dry/Scaly, Maceration, Moist, Atrophie Blanche, Cyanosis, Ecchymosis, Hemosiderin Staining, Mottled, Pallor, Rubor, Erythema.  Periwound temperature was noted as No Abnormality. 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 she has not been very compliant with her visits to the wound center nor has she been applying the proper wound care products we have asked her to use. I brought her mother into the room so that I could address both of them and I have expressed my concerns regarding the cellulitis of her left foot and I believe she needs to be treated with IV antibiotics appropriately. the patient says she will visit her PCP today itself and I have asked them to give him my number so that we can discuss her management. I will also try and put in a call to the PCP. We will use silver alginate on the right heel and as far as the left toe goes it would need painting with Betadine but we will not do this right away as she is going to see her PCP and get admitted and I did not want to mask the color of the cellulitis. I have also discussed the need for regular follow-up at the wound center as it is going to be impossible to monitor her closely if she comes once a month to see as. Procedures Wound #1 Wound #1 is a Diabetic Wound/Ulcer of the Lower Extremity located on the Right,Distal Calcaneous . There was a Skin/Subcutaneous Tissue Debridement ([id number]) debridement with total area of 0.06 sq cm performed by BURNS III, Melanie Crazier., MD. with the following instrument(s): Curette to remove Viable and Non-Viable tissue/material including Fibrin/Slough and Subcutaneous after achieving pain control using Lidocaine 4% Topical Solution. A time out was conducted prior to the start of the procedure. A Moderate amount of bleeding was controlled with Pressure. The procedure was  tolerated well with a pain level of 0 throughout and a pain level of 0 following the procedure. Post Debridement Measurements: 0.2cm length x 0.3cm width x 0.1cm depth; 0.005cm^3 volume. Wound #2 Wound #2 is a Diabetic Wound/Ulcer of the Lower Extremity located on the Right,Proximal Calcaneous . There was a Skin/Subcutaneous Tissue Debridement ([id number]) debridement with total area of 0.64 sq cm performed by BURNS III, Melanie Crazier., MD. with the following instrument(s): Curette to remove Viable and Non-Viable tissue/material including Fibrin/Slough and Subcutaneous after achieving pain control using Lidocaine 4% Topical Solution. A time out was conducted prior to the start of the procedure. A Moderate amount of bleeding was controlled with Pressure. The procedure was tolerated well with a pain level of 0 throughout and a pain level of 0 following the procedure. Post Debridement Measurements: 0.8cm length x Omlor, Ladasha N. ([id number]) 0.8cm width x 0.1cm depth; 0.05cm^3 volume. Plan Wound Cleansing: Wound #1 Right,Distal Calcaneous: Clean wound with Normal Saline. Wound #2 Right,Proximal Calcaneous: Clean wound with Normal Saline. Wound #3 Left,Plantar Toe Great: Clean wound with Normal Saline. Wound #4 Left,Dorsal Foot: Clean wound with Normal Saline. Anesthetic: Wound #1 Right,Distal Calcaneous: Topical Lidocaine 4% cream applied to wound bed prior to debridement Wound #2 Right,Proximal Calcaneous: Topical Lidocaine 4% cream applied to wound bed prior to debridement Wound #3 Left,Plantar Toe Great: Topical Lidocaine 4% cream applied to wound bed prior to debridement  Wound #4 Left,Dorsal Foot: Topical Lidocaine 4% cream applied to wound bed prior to debridement Skin Barriers/Peri-Wound Care: Wound #1 Right,Distal Calcaneous: Skin Prep Wound #2 Right,Proximal Calcaneous: Skin Prep Wound #3 Left,Plantar Toe Great: Skin Prep Wound #4 Left,Dorsal Foot: Skin Prep Primary  Wound Dressing: Wound #1 Right,Distal Calcaneous: Aquacel Ag Wound #2 Right,Proximal Calcaneous: Aquacel Ag Secondary Dressing: Wound #1 Right,Distal Calcaneous: Boardered Foam Dressing Wound #2 Right,Proximal Calcaneous: Boardered Foam Dressing Dressing Change Frequency: Wound #1 Right,Distal Calcaneous: Change dressing every other day. Wound #2 Right,Proximal Calcaneous: Change dressing every other day. SARAIA, PLATNER ([id number]) Follow-up Appointments: Wound #1 Right,Distal Calcaneous: Return Appointment in 1 week. Wound #2 Right,Proximal Calcaneous: Return Appointment in 1 week. Wound #3 Left,Plantar Toe Great: Return Appointment in 1 week. Wound #4 Left,Dorsal Foot: Return Appointment in 1 week. Off-Loading: Wound #1 Right,Distal Calcaneous: Open toe surgical shoe with peg assist. Wound #2 Right,Proximal Calcaneous: Open toe surgical shoe with peg assist. Wound #3 Left,Plantar Toe Great: Open toe surgical shoe with peg assist. Wound #4 Left,Dorsal Foot: Open toe surgical shoe with peg assist. General Notes: Go to your PCP for infection. Leave left foot wounds open to air at this time until seen by PCP The patient has been wearing flip-flops and walking around without a dressing on her foot. she has not been very compliant with her visits to the wound center nor has she been applying the proper wound care products we have asked her to use. I brought her mother into the room so that I could address both of them and I have expressed my concerns regarding the cellulitis of her left foot and I believe she needs to be treated with IV antibiotics appropriately. the patient says she will visit her PCP today itself and I have asked them to give him my number so that we can discuss her management. I will also try and put in a call to the PCP. We will use silver alginate on the right heel and as far as the left toe goes it would need painting with Betadine but we will not  do this right away as she is going to see her PCP and get admitted and I did not want to mask the color of the cellulitis. I have also discussed the need for regular follow-up at the wound center as it is going to be impossible to monitor her closely if she comes once a month to see as. Addendum: after seeing the patient I have personally spoken to her PCP Dr. [name] and discussed the recent cellulitis she's developed and recommended that she get IV antibiotics and inpatient care for a few days. Electronic Signature(s) Signed: 05/16/2015 4:14:39 PM By: [name] MD, FACS Previous Signature: 05/09/2015 1:28:11 PM Version By: [name] MD, FACS JUNELL, CULLIFER ([id number]) Previous Signature: 05/09/2015 1:12:01 PM Version By: [name] MD, FACS Entered By: [name] on 05/16/2015 16:14:39 [name] ([id number]) -------------------------------------------------------------------------------- SuperBill Details Patient Name: [name] Date of Service: 05/09/2015 Medical Record Number: [id number] Patient Account Number: [id number] Date of Birth/Sex: [birth year] (34 y.o. 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 L03.116 Cellulitis of left lower limb Facility Procedures CPT4  Code Description: 16109604 11042 - DEB SUBQ TISSUE 20 SQ CM/< ICD-10 Description Diagnosis E10.621 Type 1 diabetes mellitus with foot ulcer L97.412 Non-pressure chronic ulcer of right heel and midfoo Modifier: t with fat lay Quantity: 1 er exposed Physician  Procedures CPT4: Description Modifier Quantity Code [id number] 99213 - WC PHYS LEVEL 3 - EST PT 25 1 ICD-10 Description Diagnosis L03.116 Cellulitis of left lower limb L97.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

## 2015-05-23 ENCOUNTER — Ambulatory Visit
Admission: RE | Admit: 2015-05-23 | Discharge: 2015-05-23 | Disposition: A | Discharge status: Home / Self Care | Payer: Medicare Other | Source: Ambulatory Visit | Attending: Vascular Surgery | Admitting: Vascular Surgery

## 2015-05-23 ENCOUNTER — Encounter
Admission: RE | Disposition: A | Discharge status: Home / Self Care | Payer: Self-pay | Source: Ambulatory Visit | Attending: Vascular Surgery

## 2015-05-23 ENCOUNTER — Ambulatory Visit: Payer: Medicare Other | Admitting: Surgery

## 2015-05-23 ENCOUNTER — Ambulatory Visit: Admit: 2015-05-23 | Payer: Medicare Other | Source: Ambulatory Visit | Admitting: Vascular Surgery

## 2015-05-23 ENCOUNTER — Encounter: Payer: Self-pay | Source: Ambulatory Visit

## 2015-05-23 ENCOUNTER — Encounter: Payer: Self-pay | Admitting: *Deleted

## 2015-05-23 DIAGNOSIS — N186 End stage renal disease: Secondary | ICD-10-CM | POA: Diagnosis not present

## 2015-05-23 DIAGNOSIS — I871 Compression of vein: Secondary | ICD-10-CM | POA: Diagnosis not present

## 2015-05-23 DIAGNOSIS — Z79899 Other long term (current) drug therapy: Secondary | ICD-10-CM | POA: Diagnosis not present

## 2015-05-23 DIAGNOSIS — E109 Type 1 diabetes mellitus without complications: Secondary | ICD-10-CM | POA: Insufficient documentation

## 2015-05-23 DIAGNOSIS — Y832 Surgical operation with anastomosis, bypass or graft as the cause of abnormal reaction of the patient, or of later complication, without mention of misadventure at the time of the procedure: Secondary | ICD-10-CM | POA: Insufficient documentation

## 2015-05-23 DIAGNOSIS — Z992 Dependence on renal dialysis: Secondary | ICD-10-CM | POA: Diagnosis not present

## 2015-05-23 DIAGNOSIS — I70268 Atherosclerosis of native arteries of extremities with gangrene, other extremity: Secondary | ICD-10-CM | POA: Insufficient documentation

## 2015-05-23 HISTORY — PX: PERIPHERAL VASCULAR CATHETERIZATION: SHX172C

## 2015-05-23 HISTORY — DX: Anxiety disorder, unspecified: F41.9

## 2015-05-23 HISTORY — DX: Chronic kidney disease, unspecified: N18.9

## 2015-05-23 HISTORY — DX: Type 2 diabetes mellitus without complications: E11.9

## 2015-05-23 HISTORY — DX: Peripheral vascular disease, unspecified: I73.9

## 2015-05-23 LAB — POTASSIUM (ARMC VASCULAR LAB ONLY): Potassium (ARMC vascular lab): 4

## 2015-05-23 SURGERY — A/V SHUNTOGRAM/FISTULAGRAM
Anesthesia: Moderate Sedation | Laterality: Right

## 2015-05-23 SURGERY — UPPER EXTREMITY INTERVENTION
Anesthesia: Moderate Sedation

## 2015-05-23 MED ORDER — OXYCODONE-ACETAMINOPHEN 5-325 MG PO TABS: 1.0000 | ORAL_TABLET | ORAL | Status: DC | PRN

## 2015-05-23 MED ORDER — IOHEXOL 300 MG/ML  SOLN
INTRAMUSCULAR | Status: DC | PRN
  Administered 2015-05-23: 70 mL via INTRA_ARTERIAL

## 2015-05-23 MED ORDER — CEFAZOLIN SODIUM 1-5 GM-% IV SOLN
INTRAVENOUS | Status: AC
  Filled 2015-05-23: qty 50

## 2015-05-23 MED ORDER — ONDANSETRON HCL 4 MG/2ML IJ SOLN: 4.0000 mg | Freq: Four times a day (QID) | INTRAMUSCULAR | Status: DC | PRN

## 2015-05-23 MED ORDER — FENTANYL CITRATE (PF) 100 MCG/2ML IJ SOLN
INTRAMUSCULAR | Status: AC
  Filled 2015-05-23: qty 2

## 2015-05-23 MED ORDER — GUAIFENESIN-DM 100-10 MG/5ML PO SYRP
15.0000 mL | ORAL_SOLUTION | ORAL | Status: DC | PRN
  Filled 2015-05-23: qty 15

## 2015-05-23 MED ORDER — HEPARIN SODIUM (PORCINE) 1000 UNIT/ML IJ SOLN
INTRAMUSCULAR | Status: AC
  Filled 2015-05-23: qty 1

## 2015-05-23 MED ORDER — PHENOL 1.4 % MT LIQD
1.0000 | OROMUCOSAL | Status: DC | PRN
  Filled 2015-05-23: qty 177

## 2015-05-23 MED ORDER — HEPARIN SODIUM (PORCINE) 1000 UNIT/ML IJ SOLN
INTRAMUSCULAR | Status: DC | PRN
  Administered 2015-05-23: 3000 [IU] via INTRAVENOUS

## 2015-05-23 MED ORDER — LABETALOL HCL 5 MG/ML IV SOLN: 10.0000 mg | INTRAVENOUS | Status: DC | PRN

## 2015-05-23 MED ORDER — SODIUM CHLORIDE 0.9 % IV SOLN: INTRAVENOUS | Status: DC

## 2015-05-23 MED ORDER — MIDAZOLAM HCL 2 MG/2ML IJ SOLN
INTRAMUSCULAR | Status: DC | PRN
  Administered 2015-05-23: 1 mg via INTRAVENOUS
  Administered 2015-05-23: 2 mg via INTRAVENOUS
  Administered 2015-05-23: 1 mg via INTRAVENOUS
  Administered 2015-05-23: 2 mg via INTRAVENOUS

## 2015-05-23 MED ORDER — HYDROMORPHONE HCL 1 MG/ML IJ SOLN: 0.5000 mg | INTRAMUSCULAR | Status: DC | PRN

## 2015-05-23 MED ORDER — SODIUM CHLORIDE 0.9 % IV SOLN: 500.0000 mL | Freq: Once | INTRAVENOUS | Status: DC | PRN

## 2015-05-23 MED ORDER — ACETAMINOPHEN 325 MG PO TABS: 325.0000 mg | ORAL_TABLET | ORAL | Status: DC | PRN

## 2015-05-23 MED ORDER — SODIUM CHLORIDE 0.9 % IV SOLN
INTRAVENOUS | Status: DC
  Administered 2015-05-23: 12:00:00 via INTRAVENOUS

## 2015-05-23 MED ORDER — METOPROLOL TARTRATE 1 MG/ML IV SOLN: 2.0000 mg | INTRAVENOUS | Status: DC | PRN

## 2015-05-23 MED ORDER — SODIUM CHLORIDE 0.9 % IJ SOLN
INTRAMUSCULAR | Status: AC
  Filled 2015-05-23: qty 15

## 2015-05-23 MED ORDER — MIDAZOLAM HCL 5 MG/5ML IJ SOLN
INTRAMUSCULAR | Status: AC
  Filled 2015-05-23: qty 5

## 2015-05-23 MED ORDER — MIDAZOLAM HCL 2 MG/2ML IJ SOLN
INTRAMUSCULAR | Status: AC
  Filled 2015-05-23: qty 2

## 2015-05-23 MED ORDER — FENTANYL CITRATE (PF) 100 MCG/2ML IJ SOLN
INTRAMUSCULAR | Status: DC | PRN
  Administered 2015-05-23: 100 ug via INTRAVENOUS
  Administered 2015-05-23 (×3): 50 ug via INTRAVENOUS

## 2015-05-23 MED ORDER — CEFAZOLIN SODIUM 1-5 GM-% IV SOLN
1.0000 g | Freq: Once | INTRAVENOUS | Status: AC
  Administered 2015-05-23: 1 g via INTRAVENOUS

## 2015-05-23 MED ORDER — ACETAMINOPHEN 325 MG RE SUPP
325.0000 mg | RECTAL | Status: DC | PRN
  Filled 2015-05-23: qty 2

## 2015-05-23 MED ORDER — HYDRALAZINE HCL 20 MG/ML IJ SOLN: 5.0000 mg | INTRAMUSCULAR | Status: DC | PRN

## 2015-05-23 SURGICAL SUPPLY — 19 items
BALLN ULTRVRSE 2X300X150 (BALLOONS) ×2
BALLN ULTRVRSE 2X300X150 OTW (BALLOONS) ×1
BALLOON ULTRVRSE 2X300X150 OTW (BALLOONS) ×1 IMPLANT
CANNULA NASAL 8 HUDSON (TUBING) ×3 IMPLANT
CATH CXI SUPP ANG 4FR 135 (MICROCATHETER) ×1 IMPLANT
CATH CXI SUPP ANG 4FR 135CM (MICROCATHETER) ×3
CATH H1 100CM (CATHETERS) ×3 IMPLANT
CATH PIG 5.0X110 10S (CATHETERS) ×3 IMPLANT
DEVICE PRESTO INFLATION (MISCELLANEOUS) ×3 IMPLANT
DEVICE STARCLOSE SE CLOSURE (Vascular Products) ×3 IMPLANT
GLIDEWIRE ADV .035X260CM (WIRE) ×3 IMPLANT
GUIDEWIRE PFTE-COATED .018X300 (WIRE) ×3 IMPLANT
PACK ANGIOGRAPHY (CUSTOM PROCEDURE TRAY) ×3 IMPLANT
SHEATH BRITE TIP 5FRX11 (SHEATH) ×3 IMPLANT
SHEATH SHUTTLE SELECT 6F (SHEATH) ×3 IMPLANT
SYR MEDRAD MARK V 150ML (SYRINGE) ×3 IMPLANT
TUBING CONTRAST HIGH PRESS 72 (TUBING) ×3 IMPLANT
WIRE G STIF.035X260 STR (WIRE) ×3 IMPLANT
WIRE J 3MM .035X145CM (WIRE) ×3 IMPLANT

## 2015-05-23 NOTE — Discharge Instructions (Signed)
Patient alert and oriented. Reporting no pain. Left groin site clean dry intact, no hematoma. Pulses WNL. Reviewed discharge instructions with patient and parents, follow up appointment scheduled. Discharge to home per order. Escorted out via wheelchair.

## 2015-05-23 NOTE — H&P (Signed)
  [facility] History & Physical Update  The patient was interviewed and re-examined.  The patient's previous History and Physical has been reviewed and is unchanged.  There is no change in the plan of care. We plan to proceed with the scheduled procedure.  DEW,JASON, MD  05/23/2015, 10:39 AM

## 2015-05-23 NOTE — Op Note (Signed)
OPERATIVE REPORT   PREOPERATIVE DIAGNOSIS: 1. End-stage renal disease. 2. Gangrene at tip of finger on left hand 3. PAD  POSTOPERATIVE DIAGNOSIS: Same as above  PROCEDURE PERFORMED: 1. Ultrasound guidance vascular access to left femoral artery. 2. Catheter placement to left radial artery and left ulnar arteries  from left femoral approach. 3. Thoracic aortogram and selective left upper extremity angiogram  including selective images of the radial and ulnar arteries. 4. Percutaneous transluminal angioplasty of left radial artery from its origin to the wrist with 2 mm diameter angioplasty balloon 5. Percutaneous transluminal angioplasty of left ulnar artery and interosseous artery proximally with 2 mm diameter angioplasty balloon 6. StarClose closure device left femoral artery.  SURGEON: Jason S Dew, MD  ANESTHESIA: Local with moderate conscious sedation.  BLOOD LOSS: Minimal.  FLUOROSCOPY TIME: 11 minutes  CONTRAST: 70 cc  INDICATION FOR PROCEDURE: This is a 33-year-old female who presented to our office with gangrenous changes to the finger tip on the left hand. She has end-stage renal disease and multiple other comorbidities and has already undergone revascularization of the lower extremities for ulceration.  To further evaluate this to determine what options would be possible to treat her situation, angiogram of the left upper extremity is indicated. Risks and benefits are discussed. Informed consent was obtained.  DESCRIPTION OF PROCEDURE: The patient was brought to the vascular suite. Groins were shaved and prepped and sterile surgical field was created. The left femoral head was localized with fluoroscopy and the left femoral artery was then visualized with ultrasound and found to be widely patent. It was then accessed under direct ultrasound guidance without difficulty with a Seldinger needle and a permanent image was recorded. A J-wire and  5-French sheath were then placed. Pigtail catheter was placed into the ascending aorta and a thoracic aortogram was then performed in the LAO projection. This demonstrated normal origins to the great vessels without significant proximal stenoses and a normal configuration of the great vessels. The patient was given 3000 units of intravenous heparin and a headhunter catheter was used to selectively cannulate the left subclavian artery without difficulty. This was then sequentially advanced to the brachial artery and to the brachial bifurcation.  The radial artery was entered first, but the catheter only went to the very origin of the radial artery, then exchanged for a 135 cm CXI catheter to evaluate more distally in the radial artery. After this, the catheter was removed and exchanged for a 6Fr Shuttle sheath and the CXI catheter was replaced and placed into the ulnar artery, this was evaluated. Findings in the left upper extremity showed the ulnar artery was occluded beyond the interosseous artery takeoff and a high-grade stenosis was seen in the proximal interosseous artery of greater than 90%. This then provided reasonably good flow to the distal forearm but not of a lot of flow into the hand. The radial artery had multiple areas of greater than 70% stenosis and then occluded in the hand at and just beyond the wrist. At this point, I elected to try to improve her perfusion. The CXI catheter and a 0.018 advantage wire were used to cross the multiple areas of stenosis in the radial artery and advanced to the hand. I then used a 2 mm diameter by 30 cm length angioplasty balloon inflated to 12 atm encompassing the entire radial artery to the level of the wrist. There was never significant distal reconstitution in the palmar arch to treat more distally even when probing with the long   catheter and the 0.018 advantage wire. The multiple areas of stenosis in the radial artery were improved with no residual  stenosis of less than 20%, but the vessel still occluded in the hand. Despite multiple attempts, I did not see a way to improve her perfusion in the radial artery. To try to improve her perfusion distally, I then turned my attention to the ulnar artery and interosseous artery. Using the CXI catheter and the 0.018 advantage wire was able to navigate through the ulnar artery and the high-grade stenosis in the proximal interosseous artery. I then treated this lesion with a 2 mm diameter angioplasty balloon as well. This was inflated to 10 atm for 1 minute. Completion angiogram showed less than 20% residual stenosis at the proximal interosseous artery with reasonably brisk flow through this vessel distally into the forearm but still not a lot of flow into the hand. On delayed imaging, a second and fifth digital vessels could be seen filling slowly through collaterals. 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 I did try to probe this with the catheter and wire without any success and crossing the occlusion. At this point, I felt that we had improved perfusion is much as possible in an endovascular fashion and I elected to terminate the procedure. The diagnostic catheter was removed. Oblique arteriogram was performed of the left femoral artery and StarClose closure device deployed in the usual fashion with excellent hemostatic result. The patient tolerated the procedure well and was taken to the recovery room in stable condition.   DEW,JASON 05/23/2015 12:48 PM

## 2015-05-23 NOTE — Discharge Instructions (Signed)

## 2015-05-23 NOTE — H&P (Signed)
[facility] History & Physical Update  The patient was interviewed and dialysis center has also suggested that she needs a fistulagram to evaluate decreased flow in AVF yesterday.  Her left arm/hand symptoms of gangrene are more pressing, and we will do left arm angio today and consider fistulagram in the future as well  Tenia Goh, MD  05/23/2015, 11:39 AM

## 2015-05-27 ENCOUNTER — Encounter
Admission: RE | Disposition: A | Discharge status: Home / Self Care | Payer: Self-pay | Source: Ambulatory Visit | Attending: Vascular Surgery

## 2015-05-27 ENCOUNTER — Encounter: Payer: Self-pay | Admitting: *Deleted

## 2015-05-27 ENCOUNTER — Ambulatory Visit
Admission: RE | Admit: 2015-05-27 | Discharge: 2015-05-27 | Disposition: A | Discharge status: Home / Self Care | Payer: Medicare Other | Source: Ambulatory Visit | Attending: Vascular Surgery | Admitting: Vascular Surgery

## 2015-05-27 DIAGNOSIS — Y832 Surgical operation with anastomosis, bypass or graft as the cause of abnormal reaction of the patient, or of later complication, without mention of misadventure at the time of the procedure: Secondary | ICD-10-CM | POA: Diagnosis not present

## 2015-05-27 DIAGNOSIS — N186 End stage renal disease: Secondary | ICD-10-CM | POA: Diagnosis not present

## 2015-05-27 DIAGNOSIS — T82858A Stenosis of vascular prosthetic devices, implants and grafts, initial encounter: Secondary | ICD-10-CM | POA: Insufficient documentation

## 2015-05-27 DIAGNOSIS — E1022 Type 1 diabetes mellitus with diabetic chronic kidney disease: Secondary | ICD-10-CM | POA: Diagnosis not present

## 2015-05-27 DIAGNOSIS — Z794 Long term (current) use of insulin: Secondary | ICD-10-CM | POA: Diagnosis not present

## 2015-05-27 DIAGNOSIS — Z79899 Other long term (current) drug therapy: Secondary | ICD-10-CM | POA: Insufficient documentation

## 2015-05-27 DIAGNOSIS — Z7902 Long term (current) use of antithrombotics/antiplatelets: Secondary | ICD-10-CM | POA: Diagnosis not present

## 2015-05-27 DIAGNOSIS — I871 Compression of vein: Secondary | ICD-10-CM | POA: Insufficient documentation

## 2015-05-27 DIAGNOSIS — Z992 Dependence on renal dialysis: Secondary | ICD-10-CM | POA: Insufficient documentation

## 2015-05-27 HISTORY — PX: PERIPHERAL VASCULAR CATHETERIZATION: SHX172C

## 2015-05-27 LAB — POTASSIUM (ARMC VASCULAR LAB ONLY): Potassium (ARMC vascular lab): 4.6

## 2015-05-27 SURGERY — A/V SHUNTOGRAM/FISTULAGRAM
Anesthesia: Moderate Sedation

## 2015-05-27 MED ORDER — FENTANYL CITRATE (PF) 100 MCG/2ML IJ SOLN
INTRAMUSCULAR | Status: AC
  Filled 2015-05-27: qty 2

## 2015-05-27 MED ORDER — CEFAZOLIN SODIUM 1-5 GM-% IV SOLN: 1.0000 g | Freq: Once | INTRAVENOUS | Status: DC

## 2015-05-27 MED ORDER — HEPARIN SODIUM (PORCINE) 1000 UNIT/ML IJ SOLN
INTRAMUSCULAR | Status: AC
  Filled 2015-05-27: qty 1

## 2015-05-27 MED ORDER — IOHEXOL 300 MG/ML  SOLN
INTRAMUSCULAR | Status: DC | PRN
  Administered 2015-05-27: 25 mL

## 2015-05-27 MED ORDER — HEPARIN SODIUM (PORCINE) 1000 UNIT/ML IJ SOLN
INTRAMUSCULAR | Status: DC | PRN
  Administered 2015-05-27: 3000 [IU] via INTRAVENOUS

## 2015-05-27 MED ORDER — HEPARIN (PORCINE) IN NACL 2-0.9 UNIT/ML-% IJ SOLN
INTRAMUSCULAR | Status: AC
  Filled 2015-05-27: qty 1000

## 2015-05-27 MED ORDER — ATROPINE SULFATE 0.1 MG/ML IJ SOLN: 0.5000 mg | Freq: Once | INTRAMUSCULAR | Status: DC | PRN

## 2015-05-27 MED ORDER — FENTANYL CITRATE (PF) 100 MCG/2ML IJ SOLN
INTRAMUSCULAR | Status: DC | PRN
  Administered 2015-05-27 (×3): 50 ug via INTRAVENOUS

## 2015-05-27 MED ORDER — MIDAZOLAM HCL 2 MG/2ML IJ SOLN
INTRAMUSCULAR | Status: AC
  Filled 2015-05-27: qty 4

## 2015-05-27 MED ORDER — ONDANSETRON HCL 4 MG/2ML IJ SOLN: 4.0000 mg | INTRAMUSCULAR | Status: DC | PRN

## 2015-05-27 MED ORDER — CEFAZOLIN SODIUM 1-5 GM-% IV SOLN
INTRAVENOUS | Status: AC
  Filled 2015-05-27: qty 50

## 2015-05-27 MED ORDER — LIDOCAINE-EPINEPHRINE (PF) 1 %-1:200000 IJ SOLN
INTRAMUSCULAR | Status: AC
  Filled 2015-05-27: qty 30

## 2015-05-27 MED ORDER — MIDAZOLAM HCL 2 MG/2ML IJ SOLN
INTRAMUSCULAR | Status: AC
  Filled 2015-05-27: qty 2

## 2015-05-27 MED ORDER — HYDROMORPHONE HCL 1 MG/ML IJ SOLN: 1.0000 mg | INTRAMUSCULAR | Status: DC | PRN

## 2015-05-27 MED ORDER — DEXTROSE 50 % IV SOLN: 0.5000 | Freq: Once | INTRAVENOUS | Status: DC | PRN

## 2015-05-27 MED ORDER — SODIUM CHLORIDE 0.9 % IV SOLN
INTRAVENOUS | Status: DC
  Administered 2015-05-27: 13:00:00 via INTRAVENOUS

## 2015-05-27 MED ORDER — MIDAZOLAM HCL 2 MG/2ML IJ SOLN
INTRAMUSCULAR | Status: DC | PRN
  Administered 2015-05-27: 2 mg via INTRAVENOUS
  Administered 2015-05-27: 1 mg via INTRAVENOUS
  Administered 2015-05-27: 2 mg via INTRAVENOUS

## 2015-05-27 SURGICAL SUPPLY — 18 items
BAG DECANTER STRL (MISCELLANEOUS) ×3 IMPLANT
BALLN ARMADA 3.0X60X150 (BALLOONS) ×2
BALLN ARMADA 3X60X150 (BALLOONS) ×1
BALLN LUTONIX DCB 5X40X130 (BALLOONS) ×3
BALLN ULTRVRSE 018 2.5X100X150 (BALLOONS) ×3
BALLOON ARMADA 3X60X150 (BALLOONS) ×1 IMPLANT
BALLOON LUTONIX DCB 5X40X130 (BALLOONS) ×1 IMPLANT
BALLOON ULTRVS 018 2.5X100X150 (BALLOONS) ×1 IMPLANT
CANNULA 5F STIFF (CANNULA) ×3 IMPLANT
CATH RIM 65CM (CATHETERS) ×3 IMPLANT
CATH TORCON 5FR 0.38 (CATHETERS) ×3 IMPLANT
DEVICE PRESTO INFLATION (MISCELLANEOUS) ×3 IMPLANT
GLIDEWIRE ADV .035X180CM (WIRE) ×3 IMPLANT
PACK ANGIOGRAPHY (CUSTOM PROCEDURE TRAY) ×3 IMPLANT
SHEATH BRITE TIP 6FRX5.5 (SHEATH) ×3 IMPLANT
SUT MNCRL AB 4-0 PS2 18 (SUTURE) ×3 IMPLANT
TOWEL OR 17X26 4PK STRL BLUE (TOWEL DISPOSABLE) ×3 IMPLANT
WIRE G 018X200 V18 (WIRE) ×3 IMPLANT

## 2015-05-27 NOTE — H&P (Signed)
  [facility] History & Physical Update  The patient was interviewed and re-examined.  The patient's previous History and Physical has been reviewed and she again had difficulty with poor flow in the AVF of her right arm and the dialysis center has requested a fistulagram.  There is no change in the plan of care. We plan to proceed with the scheduled procedure.  DEW,JASON, MD  05/27/2015, 1:15 PM

## 2015-05-27 NOTE — Op Note (Signed)
[name]    OPERATIVE NOTE   PROCEDURE: 1.   Right radiocephalic arteriovenous fistula cannulation under ultrasound guidance 2.   Right arm fistulagram including central venogram 3.    Catheter placement into proximal right radial artery through the AV fistula. 4.    Right radial artery angiogram 5.     Percutaneous transluminal angioplasty of radial artery immediately proximal to the fistula with 2.5 and 3 mm diameter angioplasty balloon and of cephalic vein just beyond the fistula with 5 mm diameter Lutonix drug-coated angioplasty balloon  PRE-OPERATIVE DIAGNOSIS: 1. ESRD 2. Poorly functional  Right radiocephalic AVF  POST-OPERATIVE DIAGNOSIS: same as above   SURGEON: Leotis Pain, MD  ANESTHESIA: local with MCS  ESTIMATED BLOOD LOSS:  25 cc  FINDING(S): 1.  intimal hyperplasia at the anastomosis and occlusion of the radial artery just proximal to the anastomosis  SPECIMEN(S):  None  CONTRAST:  30 cc  INDICATIONS: [name] is a 34 y.o. female who presents with malfunctioning   Right radiocephalic arteriovenous fistula.  The patient is scheduled for   Right arm fistulagram.  The patient is aware the risks include but are not limited to: bleeding, infection, thrombosis of the cannulated access, and possible anaphylactic reaction to the contrast.  The patient is aware of the risks of the procedure and elects to proceed forward.  DESCRIPTION: After full informed written consent was obtained, the patient was brought back to the angiography suite and placed supine upon the angiography table.  The patient was connected to monitoring equipment.  The   Right arm was prepped and draped in the standard fashion for a percutaneous access intervention.  Under ultrasound guidance, the   Right radiocephalic arteriovenous fistula was cannulated with a micropuncture needle under direct ultrasound guidance  In a retrograde fashion just below the antecubital fossa  and a permanent image was performed.  The microwire was advanced into the fistula and the needle was exchanged for the a microsheath.  I then upsized to a 6 Fr Sheath and imaging was performed.  Hand injections were completed to image the access including the central venous system. This demonstrated  Significant intimal hyperplasia in the cephalic vein just beyond the anastomosis creating about a 70% narrowing. The Kumpe catheter was then placed into the distal radial artery beyond the fistula and imaging was performed which showed retrograde flow through the radial artery into the fistula and further imaging demonstrated an occlusion of the radial artery just proximal to the fistula.  Based on the images, this patient will need  Intervention to this area. I then gave the patient 3000 units of intravenous heparin. I exchanged for a rim catheter and an advantage wire and was able to cross the radial artery occlusion and get a catheter into the proximal radial artery. The artery was patent otherwise down to the occluded area 2-3 cm proximal to the anastomosis. I then exchanged for a 0.018 wire.  Based on the imaging, a 2.5 mm x 10 cm  angioplasty balloon was selected.  The balloon was centered around the radial artery occlusion and the hyperplastic cephalic vein stenosis and inflated to 14 ATM for 1 minute(s).  This was slightly undersized so I repeated this with a 3 mm diameter angioplasty balloon inflated to 12 atm for a minute. On completion imaging, a  20% residual stenosis was present in the radial artery. I used a 5 mm diameter by 4 cm length Lutonix drug-coated angioplasty balloon for the  area in the cephalic vein alone taking care not to put the distal end of the balloon into the artery. This was inflated to 12 atm. About a 30% residual stenosis was present which was not flow limiting.     Based on the completion imaging, no further intervention is necessary.  The wire and balloon were removed from the  sheath.  A 4-0 Monocryl purse-string suture was sewn around the sheath.  The sheath was removed while tying down the suture.  A sterile bandage was applied to the puncture site.  COMPLICATIONS: None  CONDITION: Stable   DEW,JASON  05/27/2015 3:44 PM

## 2015-05-27 NOTE — Discharge Instructions (Signed)
Fistulogram, Care After Refer to this sheet in the next few weeks. These instructions provide you with information on caring for yourself after your procedure. Your health care provider may also give you more specific instructions. Your treatment has been planned according to current medical practices, but problems sometimes occur. Call your health care provider if you have any problems or questions after your procedure. WHAT TO EXPECT AFTER THE PROCEDURE After your procedure, it is typical to have the following:  A small amount of discomfort in the area where the catheters were placed.  A small amount of bruising around the fistula.  Sleepiness and fatigue. HOME CARE INSTRUCTIONS  Rest at home for the day following your procedure.  Do not drive or operate heavy machinery while taking pain medicine.  Take medicines only as directed by your health care provider.  Do not take baths, swim, or use a hot tub until your health care provider approves. You may shower 24 hours after the procedure or as directed by your health care provider.  There are many different ways to close and cover an incision, including stitches, skin glue, and adhesive strips. Follow your health care provider's instructions on:  Incision care.  Bandage (dressing) changes and removal.  Incision closure removal.  Monitor your dialysis fistula carefully. SEEK MEDICAL CARE IF:  You have drainage, redness, swelling, or pain at your catheter site.  You have a fever.  You have chills. SEEK IMMEDIATE MEDICAL CARE IF:  You feel weak.  You have trouble balancing.  You have trouble moving your arms or legs.  You have problems with your speech or vision.  You can no longer feel a vibration or buzz when you put your fingers over your dialysis fistula.  The limb that was used for the procedure:  Swells.  Is painful.  Is cold.  Is discolored, such as blue or pale white. Document Released: 01/29/2014  Document Reviewed: 11/03/2013 [facility] Patient Information 2015 [place], [place]. This information is not intended to replace advice given to you by your health care provider. Make sure you discuss any questions you have with your health care provider.   The drugs you were given will stay in your system until tomorrow, so for the next 24 hours you should not.  Drive an automobile. Make any legal decisions.  Drink any alcoholic beverages.  Today you should start with liquids and gradually work up to solid foods as your are able to tolerate them  Resume your regular medications as prescribed by your doctor.  Avoid aspirin for 24 hours.    Change the Band-Aid or dressing as needed.  After 1 day no dressing is needed.  Avoid strenuous activity for the remainder of the day.  Please notify your primary physician immediately if you have any unusual bleeding, trouble breathing, fever >100 degrees or pain not relieved by the medication your doctor prescribed for your doctor prescribed for you physician  Return to diaslysis  Tommorow.  Notify the doctor for signs of bleeding and infection such as; bleeding, swelling, redness, pain that does not decrease with tylenol, cloudy drainage and fevers.

## 2015-05-29 ENCOUNTER — Encounter: Payer: Self-pay | Admitting: Vascular Surgery

## 2015-05-30 ENCOUNTER — Encounter: Payer: Medicare Other | Attending: Surgery | Admitting: Surgery

## 2015-05-30 DIAGNOSIS — E10621 Type 1 diabetes mellitus with foot ulcer: Secondary | ICD-10-CM | POA: Insufficient documentation

## 2015-05-30 DIAGNOSIS — L97429 Non-pressure chronic ulcer of left heel and midfoot with unspecified severity: Secondary | ICD-10-CM | POA: Insufficient documentation

## 2015-05-30 DIAGNOSIS — N189 Chronic kidney disease, unspecified: Secondary | ICD-10-CM | POA: Diagnosis not present

## 2015-05-30 DIAGNOSIS — E1052 Type 1 diabetes mellitus with diabetic peripheral angiopathy with gangrene: Secondary | ICD-10-CM | POA: Diagnosis not present

## 2015-05-30 DIAGNOSIS — K580 Irritable bowel syndrome with diarrhea: Secondary | ICD-10-CM | POA: Diagnosis not present

## 2015-05-30 DIAGNOSIS — I70245 Atherosclerosis of native arteries of left leg with ulceration of other part of foot: Secondary | ICD-10-CM | POA: Insufficient documentation

## 2015-05-30 DIAGNOSIS — E1022 Type 1 diabetes mellitus with diabetic chronic kidney disease: Secondary | ICD-10-CM | POA: Diagnosis not present

## 2015-05-30 DIAGNOSIS — L97412 Non-pressure chronic ulcer of right heel and midfoot with fat layer exposed: Secondary | ICD-10-CM | POA: Diagnosis not present

## 2015-05-30 DIAGNOSIS — L03116 Cellulitis of left lower limb: Secondary | ICD-10-CM | POA: Insufficient documentation

## 2015-05-30 NOTE — Progress Notes (Signed)
[name], [name] ([id number]) Visit Report for 05/30/2015 HBO Risk Assessment Details [name], [name] Date of Service: 05/30/2015 11:00 AM Patient Name: N. Patient Account Number: [id number] Medical Record [name], RN, [name], [id number] Treating RN: Number: [place] 03/07/1981 (34 y.o. Other Clinician: Date of Birth/Sex: Female) Treating Britto, [name] Primary Care Physician: [name] Physician/Extender: Referring Physician: [name] in Treatment: 9 HBO Risk Assessment Items Answer Barotrauma Risks: Upper Respiratory Infections No Prior Radiation Treatment to Head/Neck No Tracheostomy No Ear problems or surgery (otosclerosis)- Consider pressure equalization tubes No Sinus Problems, Sinus Obstruction No Pulmonary Risks: Currently seeing a pulmonologisto No Emphysema No Pneumothorax No Tuberculosis No Other lung problems (COPD with CO2 retention, lesions, surgery) -Refer to No CPGs Congestive heart Failure -Consider holding HBO if ejection fraction<30% No History of smoking No Bullous Disease, Blebs No Other pulmonary abnormalities No Cardiac Risks: Dr Mason Jim Currently seeing a cardiologisto Yes [place] Pacemaker/AICD No Hypertension No Diuretic Used (water pill). If yes, last time taken: No History of prior or current malignancy (Cancer) Surgery No Chemotherapy No [name], [name] ([id number]) Ophthalmic Risks: Optic Neuritis No Cataracts No Myopia No Retinopathy or Retinal Detachment Surgery- Consider pressure equalization No tubes Confinement Anxiety Claustrophobia No Dialysis Dialysis Yes if Yes to Dialysis, Type and Schedule MWF Date of last Dialysis 05/29/2015 Any implants; medical or non-medical No Pregnancy No Diabetes Long acting insulin- List: levemir Short acting Insulin-List: humalog HgbA1C within 3 months Yes Seizures Seizures No Currently using these medications: Aspirin No Digoxin (CHF patient) No Narcotics No Nitroprusside  No Phenothiazine (Thorazine,etc.) No Prednisone or other steroids No Disulfiram (Antabuse) No Mafenide Acetate (Sulfamylon-burn cream) No Amiodarone No Electronic Signature(s) Signed: 05/30/2015 12:18:12 PM By: Elpidio Eric [name], RN Entered By: Elpidio Eric on 05/30/2015 12:18:11

## 2015-05-30 NOTE — Progress Notes (Addendum)
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 0.8 Width: (cm) 0.9 Depth: (cm) 0.1 Area: (cm) 0.565 Volume: (cm) 0.057 % Reduction in Area: 34.6% % Reduction in Volume: 67.1% Epithelialization: None Tunneling: No Undermining: No Wound Description Classification: Grade 1 Wound Margin: Distinct, outline attached Exudate Amount: Small Exudate Type: Serous Exudate Color: amber Foul Odor After Cleansing: No Wound Bed Granulation Amount: Medium (34-66%) Exposed Structure Granulation Quality: Pink Fascia Exposed: No Farnell, Laquilla [name] ([id number]) Necrotic Amount: Medium (34-66%) Fat Layer Exposed: No Necrotic Quality: Adherent Slough Tendon Exposed: No Muscle Exposed: No Joint Exposed: No Bone Exposed: No Limited to Skin Breakdown Periwound Skin Texture Texture Color No Abnormalities Noted: No No Abnormalities Noted: No Callus: No Atrophie Blanche: No Crepitus: No Cyanosis: No Excoriation: No Ecchymosis: No Fluctuance: No Erythema: No Friable: No Hemosiderin Staining: No Induration: No Mottled: No Localized Edema: No Pallor: No Rash: No Rubor: No Scarring: No Temperature / Pain Moisture Temperature: No Abnormality No Abnormalities Noted: No Dry / Scaly: No Maceration: No Moist: Yes Wound Preparation Ulcer Cleansing: Rinsed/Irrigated with Saline Topical Anesthetic Applied: Other: lidocaine 4%, Treatment Notes Wound #1 (Right, Distal Calcaneous) 1. Cleansed with: Clean wound with Normal Saline 4. Dressing Applied: Aquacel Ag 5. Secondary Dressing Applied Bordered Foam Dressing 6. Footwear/Offloading device applied Other footwear/offloading device applied (specify in notes) Notes darco betadine paint to left great toe Electronic Signature(s) Signed: 05/30/2015 11:40:06 AM By: Elpidio Eric [name], RN Entered By:  Elpidio Eric on 05/30/2015 11:40:06 [name] [name] ([id number]) [place], Ledell Peoples ([id number]) -------------------------------------------------------------------------------- Wound Assessment Details [name] [name] Date of Service: 05/30/2015 11:00 AM Patient Name: [name] Patient Account Number: [id number] Medical Record [name], RN, [name], [id number] Treating RN: Number: [place] Sink Date of Birth/Sex: [birth year] (33 y.o. Female) Other Clinician: Primary Care Physician: [name] Treating [name] Referring Physician: [name] Physician/Extender: Tania Ade in Treatment: 9 Wound Status Wound Number: 2 Primary Diabetic Wound/Ulcer of the Lower Etiology: Extremity Wound Location: Right Calcaneous - Proximal Wound Open Wounding Event: Gradually Appeared Status: Date Acquired: 02/25/2015 Comorbid Anemia, Type I Diabetes, End Stage Weeks Of Treatment: 9 History: Renal Disease, Rheumatoid Arthritis, Clustered Wound: No Neuropathy Photos Photo Uploaded By: Elpidio Eric on 05/30/2015 17:11:40 Wound Measurements Length: (cm) 0.3 Width: (cm) 0.3 Depth: (cm) 0.1 Area: (cm) 0.071 Volume: (cm) 0.007 % Reduction in Area: 83.3% % Reduction in Volume: 91.8% Epithelialization: None Tunneling: No Undermining: No Wound Description Classification: Grade 0 Wound Margin: Distinct, outline attached Exudate Amount: Small Exudate Type: Serous Exudate Color: amber Wound Bed Granulation Amount: Small (1-33%) Exposed Structure Granulation Quality: Pink Fascia  Exposed: No [place], Lucky [name] ([id number]) Necrotic Amount: Large (67-100%) Fat Layer Exposed: No Necrotic Quality: Adherent Slough Tendon Exposed: No Muscle Exposed: No Joint Exposed: No Bone Exposed: No Limited to Skin Breakdown Periwound Skin Texture Texture Color No Abnormalities Noted: No No Abnormalities Noted: No Callus: No Atrophie Blanche: No Crepitus: No Cyanosis: No Excoriation: No Ecchymosis:  No Fluctuance: No Erythema: No Friable: No Hemosiderin Staining: No Induration: No Mottled: No Localized Edema: No Pallor: No Rash: No Rubor: No Scarring: No Temperature / Pain Moisture Temperature: No Abnormality No Abnormalities Noted: No Dry / Scaly: No Maceration: No Moist: Yes Wound Preparation Ulcer Cleansing: Rinsed/Irrigated with Saline Topical Anesthetic Applied: Other: lidocaine4%, Treatment Notes Wound #2 (Right, Proximal Calcaneous) 1. Cleansed with: Clean wound with Normal Saline 4. Dressing Applied: Aquacel Ag 5. Secondary Dressing Applied Bordered Foam Dressing 6. Footwear/Offloading device applied Other footwear/offloading device applied (specify in notes) Notes darco betadine paint to left great toe Electronic Signature(s) Signed: 05/30/2015 11:40:33 AM By: Elpidio Eric [name], RN Entered By: Elpidio Eric on 05/30/2015 11:40:33 [name] [name] ([id number]) [place], Ledell Peoples ([id number]) -------------------------------------------------------------------------------- Wound Assessment Details [name] [name] Date of Service: 05/30/2015 11:00 AM Patient Name: [name] Patient Account Number: [id number] Medical Record [name], RN, [name], [id number] Treating RN: Number: [place] Sink Date of Birth/Sex: 1981-06-16 (33 y.o. Female) Other Clinician: Primary Care Physician: [name] Treating [name] Referring Physician: [name] Physician/Extender: Tania Ade in Treatment: 9 Wound Status Wound Number: 3 Primary Skin Tear Etiology: Wound Location: Left Toe Great - Plantar Wound Open Wounding Event: Shear/Friction Status: Date Acquired: 04/22/2015 Comorbid Anemia, Type I Diabetes, End Stage Weeks Of Treatment: 5 History: Renal Disease, Rheumatoid Arthritis, Clustered Wound: No Neuropathy Photos Photo Uploaded By: Elpidio Eric on 05/30/2015 17:11:40 Wound Measurements Length: (cm) 3.5 Width: (cm) 3 Depth: (cm) 0.1 Area: (cm) 8.247 Volume: (cm)  0.825 % Reduction in Area: -250% % Reduction in Volume: -249.6% Epithelialization: None Wound Description Classification: Partial Thickness Diabetic Severity (Wagner): Grade 3 Wagner Verification: Gangrenous Wound Margin: Distinct, outline attach Exudate Amount: None Present Foul Odor After Cleansing: No ed Wound Bed Granulation Amount: None Present (0%) Exposed Structure Necrotic Amount: Large (67-100%) Fascia Exposed: No Galloway, Amiliana [name] ([id number]) Necrotic Quality: Eschar Fat Layer Exposed: No Tendon Exposed: No Muscle Exposed: No Joint Exposed: No Bone Exposed: No Limited to Skin Breakdown Periwound Skin Texture Texture Color No Abnormalities Noted: No No Abnormalities Noted: No Callus: No Atrophie Blanche: No Crepitus: No Cyanosis: No Excoriation: No Ecchymosis: No Fluctuance: No Erythema: No Friable: No Hemosiderin Staining: No Induration: No Mottled: No Localized Edema: No Pallor: No Rash: No Rubor: No Scarring: No Temperature / Pain Moisture Temperature: No Abnormality No Abnormalities Noted: No Dry / Scaly: Yes Maceration: No Moist: No Wound Preparation Ulcer Cleansing: Rinsed/Irrigated with Saline Topical Anesthetic Applied: None Treatment Notes Wound #3 (Left, Plantar Toe Great) 1. Cleansed with: Clean wound with Normal Saline 4. Dressing Applied: Aquacel Ag 5. Secondary Dressing Applied Bordered Foam Dressing 6. Footwear/Offloading device applied Other footwear/offloading device applied (specify in notes) Notes darco betadine paint to left great toe Electronic Signature(s) Signed: 05/30/2015 11:51:52 AM By: Elpidio Eric [name], RN Previous Signature: 05/30/2015 11:41:01 AM Version By: Elpidio Eric [name], RN Entered By: Elpidio Eric on 05/30/2015 11:51:52 [name] [name] ([id number]) [place], Ledell Peoples ([id number]) -------------------------------------------------------------------------------- Wound Assessment Details [name] [name] Date of Service: 05/30/2015 11:00 AM Patient Name: [name] Patient Account Number: [id number] Medical Record [name], RN, [name], [id number] Treating RN: Number: [place] Sink Date of Birth/Sex: [birth year] (33 y.o. Female) Other  Clinician: Primary Care Physician: [name] Treating [name] Referring Physician: [name] Physician/Extender: Tania Ade in Treatment: 9 Wound Status Wound Number: 4 Primary Diabetic Wound/Ulcer of the Lower Etiology: Extremity Wound Location: Left, Dorsal Foot Wound Status: Open Wounding Event: Gradually Appeared Date Acquired: 04/29/2015 Weeks Of Treatment: 3 Clustered Wound: No Photos Photo Uploaded By: Elpidio Eric on 05/30/2015 17:15:34 Wound Measurements Length: (cm) 0.5 Width: (cm) 0.4 Depth: (cm) 0.1 Area: (cm) 0.157 Volume: (cm) 0.016 % Reduction in Area: -67% % Reduction in Volume: -77.8% Wound Description Classification: Grade 1 Periwound Skin Texture Texture Color No Abnormalities Noted: No No Abnormalities Noted: No Moisture No Abnormalities Noted: No Treatment Notes [name] [name], [name] [name] ([id number]) Wound #4 (Left, Dorsal Foot) 1. Cleansed with: Clean wound with Normal Saline 4. Dressing Applied: Aquacel Ag 5. Secondary Dressing Applied Bordered Foam Dressing 6. Footwear/Offloading device applied Other footwear/offloading device applied (specify in notes) Notes darco betadine paint to left great toe Electronic Signature(s) Signed: 05/31/2015 9:31:14 AM By: Elpidio Eric [name], RN Entered By: Elpidio Eric on 05/30/2015 11:24:07 [name] [name] ([id number]) -------------------------------------------------------------------------------- Wound Assessment Details [name] [name] Date of Service: 05/30/2015 11:00 AM Patient Name: [name] Patient Account Number: [id number] Medical Record [name], RN, [name], [id number] Treating RN: Number: [place] Sink Date of Birth/Sex: 10-14-1980 (33 y.o. Female) Other Clinician: Primary Care Physician:  [name] Treating [name] Referring Physician: [name] Physician/Extender: Tania Ade in Treatment: 9 Wound Status Wound Number: 5 Primary Skin Tear Etiology: Wound Location: Right Toe Great - Lateral Wound Open Wounding Event: Gradually Appeared Status: Date Acquired: 05/28/2015 Comorbid Anemia, Type I Diabetes, End Stage Weeks Of Treatment: 0 History: Renal Disease, Rheumatoid Arthritis, Clustered Wound: No Neuropathy Photos Photo Uploaded By: Elpidio Eric on 05/30/2015 17:15:34 Wound Measurements Length: (cm) 0.8 Width: (cm) 0.5 Depth: (cm) 0.1 Area: (cm) 0.314 Volume: (cm) 0.031 % Reduction in Area: 0% % Reduction in Volume: 0% Epithelialization: None Tunneling: No Undermining: No Wound Description Classification: Partial Thickness Foul O Diabetic Severity (Wagner): Grade 1 Wound Margin: Distinct, outline attached Exudate Amount: Small Exudate Type: Serous Exudate Color: amber dor After Cleansing: No Wound Bed Granulation Amount: Large (67-100%) Exposed Structure [name] [name], [name] [name] ([id number]) Granulation Quality: Pink Fascia Exposed: No Necrotic Amount: None Present (0%) Fat Layer Exposed: No Tendon Exposed: No Muscle Exposed: No Joint Exposed: No Bone Exposed: No Limited to Skin Breakdown Periwound Skin Texture Texture Color No Abnormalities Noted: No No Abnormalities Noted: No Callus: No Atrophie Blanche: No Crepitus: No Cyanosis: No Excoriation: No Ecchymosis: No Fluctuance: No Erythema: No Friable: No Hemosiderin Staining: No Induration: No Mottled: No Localized Edema: No Pallor: No Rash: No Rubor: No Scarring: No Temperature / Pain Moisture Temperature: No Abnormality No Abnormalities Noted: No Dry / Scaly: No Maceration: No Moist: Yes Wound Preparation Ulcer Cleansing: Rinsed/Irrigated with Saline Topical Anesthetic Applied: Other: lidocaine 4%, Treatment Notes Wound #5 (Right, Lateral Toe Great) 1.  Cleansed with: Clean wound with Normal Saline 4. Dressing Applied: Aquacel Ag 5. Secondary Dressing Applied Bordered Foam Dressing 6. Footwear/Offloading device applied Other footwear/offloading device applied (specify in notes) Notes darco betadine paint to left great toe Electronic Signature(s) Signed: 05/30/2015 11:37:33 AM By: Elpidio Eric [name], RN Entered By: Elpidio Eric on 05/30/2015 11:37:33 [name] [name] ([id number]) [place], Ledell Peoples ([id number]) -------------------------------------------------------------------------------- Wound Assessment Details [name] [name] Date of Service: 05/30/2015 11:00 AM Patient Name: [name] Patient Account Number: [id number] Medical Record [name], RN, [name], [id number] Treating RN: Number: [place] Sink Date of Birth/Sex: Feb 21, 1981 (33 y.o. Female) Other Clinician: Primary Care Physician: [name]  Treating [place], [name] Referring Physician: [name] Physician/Extender: Weeks in Treatment: 9 Wound Status Wound Number: 6 Primary Skin Tear Etiology: Wound Location: Left Foot - Lateral Wound Open Wounding Event: Gradually Appeared Status: Date Acquired: 05/28/2015 Comorbid Anemia, Type I Diabetes, End Stage Weeks Of Treatment: 0 History: Renal Disease, Rheumatoid Arthritis, Clustered Wound: No Neuropathy Photos Photo Uploaded By: Elpidio Eric on 05/30/2015 17:15:35 Wound Measurements Length: (cm) 0.3 Width: (cm) 1 Depth: (cm) 0.1 Area: (cm) 0.236 Volume: (cm) 0.024 % Reduction in Area: 0% % Reduction in Volume: 0% Epithelialization: None Tunneling: No Undermining: No Wound Description Classification: Partial Thickness Foul O Diabetic Severity (Wagner): Grade 1 Wound Margin: Distinct, outline attached Exudate Amount: Small Exudate Type: Serous Exudate Color: amber dor After Cleansing: No Wound Bed Granulation Amount: Large (67-100%) Exposed Structure Enriques, Heide [name] ([id number]) Granulation Quality:  Pink Fascia Exposed: No Necrotic Amount: None Present (0%) Fat Layer Exposed: No Tendon Exposed: No Muscle Exposed: No Joint Exposed: No Bone Exposed: No Limited to Skin Breakdown Periwound Skin Texture Texture Color No Abnormalities Noted: No No Abnormalities Noted: No Callus: No Atrophie Blanche: No Crepitus: No Cyanosis: No Excoriation: No Ecchymosis: No Fluctuance: No Erythema: No Friable: No Hemosiderin Staining: No Induration: No Mottled: No Localized Edema: No Pallor: No Rash: No Rubor: No Scarring: No Temperature / Pain Moisture Temperature: No Abnormality No Abnormalities Noted: No Dry / Scaly: No Maceration: No Moist: Yes Wound Preparation Ulcer Cleansing: Rinsed/Irrigated with Saline Topical Anesthetic Applied: Other: lidocaine 4%, Treatment Notes Wound #6 (Left, Lateral Foot) 1. Cleansed with: Clean wound with Normal Saline 4. Dressing Applied: Aquacel Ag 5. Secondary Dressing Applied Bordered Foam Dressing 6. Footwear/Offloading device applied Other footwear/offloading device applied (specify in notes) Notes darco betadine paint to left great toe Electronic Signature(s) Signed: 05/30/2015 11:38:18 AM By: Elpidio Eric [name], RN Entered By: Elpidio Eric on 05/30/2015 11:38:18 [name] [name] ([id number]) [place], Ledell Peoples ([id number]) -------------------------------------------------------------------------------- Wound Assessment Details [name] [name] Date of Service: 05/30/2015 11:00 AM Patient Name: [name] Patient Account Number: [id number] Medical Record [name], RN, [name], [id number] Treating RN: Number: [place] Sink Date of Birth/Sex: [birth year] (33 y.o. Female) Other Clinician: Primary Care Physician: [name] Treating [name] Referring Physician: [name] Physician/Extender: Tania Ade in Treatment: 9 Wound Status Wound Number: 7 Primary Skin Tear Etiology: Wound Location: Left Foot - Plantar Wound Open Wounding Event:  Gradually Appeared Status: Date Acquired: 05/29/2015 Comorbid Anemia, Type I Diabetes, End Stage Weeks Of Treatment: 0 History: Renal Disease, Rheumatoid Arthritis, Clustered Wound: No Neuropathy Photos Photo Uploaded By: Elpidio Eric on 05/30/2015 17:16:29 Wound Measurements Length: (cm) 0.3 Width: (cm) 1 Depth: (cm) 0.1 Area: (cm) 0.236 Volume: (cm) 0.024 % Reduction in Area: 0% % Reduction in Volume: 0% Epithelialization: None Tunneling: No Undermining: No Wound Description Classification: Partial Thickness Foul O Diabetic Severity (Wagner): Grade 1 Wound Margin: Distinct, outline attached Exudate Amount: Small Exudate Type: Serous Exudate Color: amber dor After Cleansing: No Wound Bed Granulation Amount: Large (67-100%) Exposed Structure Hawn, Sallyann [name] ([id number]) Granulation Quality: Pink Fascia Exposed: No Necrotic Amount: None Present (0%) Fat Layer Exposed: No Tendon Exposed: No Muscle Exposed: No Joint Exposed: No Bone Exposed: No Limited to Skin Breakdown Periwound Skin Texture Texture Color No Abnormalities Noted: No No Abnormalities Noted: No Callus: No Atrophie Blanche: No Crepitus: No Cyanosis: No Excoriation: No Ecchymosis: No Fluctuance: No Erythema: No Friable: No Hemosiderin Staining: No Induration: No Mottled: No Localized Edema: No Pallor: No Rash: No Rubor: No Scarring: No Temperature / Pain Moisture Temperature: No Abnormality  No Abnormalities Noted: No Dry / Scaly: No Maceration: No Moist: Yes Wound Preparation Ulcer Cleansing: Rinsed/Irrigated with Saline Topical Anesthetic Applied: Other: lidocaine 4%, Treatment Notes Wound #7 (Left, Plantar Foot) 1. Cleansed with: Clean wound with Normal Saline 4. Dressing Applied: Aquacel Ag 5. Secondary Dressing Applied Bordered Foam Dressing 6. Footwear/Offloading device applied Other footwear/offloading device applied (specify in notes) Notes darco betadine paint  to left great toe Electronic Signature(s) Signed: 05/30/2015 11:39:47 AM By: Elpidio Eric [name], RN Entered By: Elpidio Eric on 05/30/2015 11:39:47 [name] [name] ([id number]) [place], Ledell Peoples ([id number]) -------------------------------------------------------------------------------- Vitals Details [name] [name] Date of Service: 05/30/2015 11:00 AM Patient Name: [name] Patient Account Number: [id number] Medical Record [name], RN, [name], [id number] Treating RN: Number: [place] Sink Date of Birth/Sex: 04/07/1981 (33 y.o. Female) Other Clinician: Primary Care Physician: [name] Treating [name] Referring Physician: [name] Physician/Extender: Tania Ade in Treatment: 9 Vital Signs Time Taken: 11:16 Temperature (F): 98.1 Height (in): 68 Pulse (bpm): 94 Weight (lbs): 122 Respiratory Rate (breaths/min): 18 Body Mass Index (BMI): 18.5 Blood Pressure (mmHg): 137/73 Reference Range: 80 - 120 mg / dl Electronic Signature(s) Signed: 05/30/2015 11:17:13 AM By: Elpidio Eric [name], RN Entered By: Elpidio Eric on 05/30/2015 11:17:12

## 2015-05-31 NOTE — Progress Notes (Signed)
[name] ([id number]) Visit Report for 05/30/2015 Chief Complaint Document Details [name] Date of Service: 05/30/2015 11:00 AM Patient Name: N. Patient Account Number: [id number] Medical Record [name], RN, [name], [id number] Treating RN: Number: Eagar Sink 01-30-1981 (33 y.o. Other Clinician: Date of Birth/Sex: Female) [name] Primary Care Physician: [name] Physician/Extender: Referring Physician: [name] in Treatment: 9 Information Obtained from: Patient Chief Complaint The patient is back today with 3 weeks treatment at [facility] where she is had several procedures done and was found to ultimately have a dry gangrene of the left forefoot. Electronic Signature(s) Signed: 05/30/2015 12:14:56 PM By: [name] MD, FACS Entered By: [name] on 05/30/2015 12:14:56 [name] ([id number]) -------------------------------------------------------------------------------- HPI Details [name] Date of Service: 05/30/2015 11:00 AM Patient Name: N. Patient Account Number: [id number] Medical Record [name], RN, [name], [id number] Treating RN: Number: Castalia Sink [birth year] (33 y.o. Other Clinician: Date of Birth/Sex: Female) [name] Primary Care Physician: [name] Physician/Extender: Referring Physician: [name] in Treatment: 9 History of Present Illness Location: right heel ulcer Quality: Patient reports No Pain. Severity: Patient states wound are getting worse. Duration: Patient has had the wound for > 2 months prior to seeking treatment at the wound center Context: The wound occurred when the patient injured her foot on something sharp on her deck. Modifying Factors: Consults to this date include:antibiotics which included Keflex in about a month ago and now at present time she is on clindamycin. Associated Signs and Symptoms: Patient reports having difficulty standing for long periods. HPI  Description: [name] is a 34 y.o. female who presents to our wound center referred by her PCP Dr. Zada Finders for nonhealing ulcers on the lateral aspect of the right heel. The patient reports about 10 weeks ago she suffered an injury of the right heel. The wounds got infected, she was given Cephalexin and the use of Bactroban. Of note she has a history of type 1 diabetes mellitus that has been uncontrolled. Past medical history significant for type 1 diabetes mellitus not controlled, ankylosing spondylitis, anorexia nervosa, irritable bowel syndrome, chronic kidney disease, chronic diarrhea. She has been treated by her PCP with duoderm which is to be changed every 3 days. Her last hemoglobin A1c in March was 11. 05/08/2015 -- the patient was seen here once on 03/28/2015 and then once in 04/17/2015. He has been very irregular with her appointments for various reasons. She says about 2 weeks ago she was in a swimming pool and she injured her left big toe and had a blister which she started picking at. She says she's been applying some allergen over this. She's noticed that her toes on this foot and the skin over the dorsum of forefoot have turned red. She does not have any fever or any other significant signs of infection. Addendum: after seeing the patient I have personally spoken to her PCP Dr. [name] and discussed the recent cellulitis she's developed and recommended that she get IV antibiotics and inpatient care for a few days. 05/30/2015 -- the patient is here today with her mother and after her last visit on 8/10 she was seen in the ER at [facility] and they started her on oral antibiotics and got worked up with vascular surgery. 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 She was asked to follow-  up with the wound center today for  starting of hyperbaric oxygen therapy as soon as possible. Electronic Signature(s) Signed: 05/30/2015 12:16:59 PM By: [name] MD, FACS Entered By: [name] on 05/30/2015 12:16:59 [name] ([id number]) [name], [name] ([id number]) -------------------------------------------------------------------------------- Physical Exam Details [name] Date of Service: 05/30/2015 11:00 AM Patient Name: N. Patient Account Number: [id number] Medical Record [name], RN, [name], [id number] Treating RN: Number: [place] Sink 1981-07-28 (33 y.o. Other Clinician: Date of Birth/Sex: Female) Treating Yoshie Kosel Primary Care Physician: [name] Physician/Extender: Referring Physician: [name] in Treatment: 9 Constitutional . Pulse regular. Respirations normal and unlabored. Afebrile. . Eyes Nonicteric. Reactive to light. Ears, Nose, Mouth, and Throat Lips, teeth, and gums WNL.Marland Kitchen Moist mucosa without lesions . Neck supple and nontender. No palpable supraclavicular or cervical adenopathy. Normal sized without goiter. Respiratory WNL. No retractions.. Cardiovascular Pedal Pulses not palpable. No clubbing, cyanosis or edema. Chest Breasts symmetical and no nipple discharge.. Breast tissue WNL, no masses, lumps, or tenderness.. Lymphatic No adneopathy. No adenopathy. No adenopathy. Musculoskeletal Adexa without tenderness or enlargement.. Digits and nails w/o clubbing, cyanosis, infection, petechiae, ischemia, or inflammatory conditions.. Integumentary (Hair, Skin) No suspicious lesions. No crepitus or fluctuance. No peri-wound warmth or erythema. No masses.Marland Kitchen Psychiatric Judgement and insight Intact.. No evidence of depression, anxiety, or agitation.. Notes The right calcaneal wound is about the same and there is minimal slough. the left forefoot shows some evidence of cellulitis but there is a definite dry gangrene of the plantar aspect of the left big  toe. Electronic Signature(s) Signed: 05/30/2015 12:19:20 PM By: [name] MD, FACS Entered By: [name] on 05/30/2015 12:19:20 [name] ([id number]) -------------------------------------------------------------------------------- Physician Orders Details [name] Date of Service: 05/30/2015 11:00 AM Patient Name: N. Patient Account Number: [id number] Medical Record [name], RN, [name], [id number] Treating RN: Number: [place] Sink [birth year] (33 y.o. Other Clinician: Date of Birth/Sex: Female) [name] Primary Care Physician: [name] Physician/Extender: Referring Physician: [name] in Treatment: 9 Verbal / Phone Orders: Yes Clinician: [name], RN, [name], [name] Read Back and Verified: Yes Diagnosis Coding Wound Cleansing Wound #1 Right,Distal Calcaneous o Cleanse wound with mild soap and water o May Shower, gently pat wound dry prior to applying new dressing. Wound #2 Right,Proximal Calcaneous o Cleanse wound with mild soap and water o May Shower, gently pat wound dry prior to applying new dressing. Wound #3 Left,Plantar Toe Great o Cleanse wound with mild soap and water o May Shower, gently pat wound dry prior to applying new dressing. Wound #4 Left,Dorsal Foot o Cleanse wound with mild soap and water o May Shower, gently pat wound dry prior to applying new dressing. Wound #5 Right,Lateral Toe Great o Cleanse wound with mild soap and water o May Shower, gently pat wound dry prior to applying new dressing. Wound #6 Left,Lateral Foot o Cleanse wound with mild soap and water o May Shower, gently pat wound dry prior to applying new dressing. Wound #7 Left,Plantar Foot o Cleanse wound with mild soap and water o May Shower, gently pat wound dry prior to applying new dressing. Anesthetic Wound #1 Right,Distal Calcaneous o Topical Lidocaine 4% cream applied to wound bed prior to debridement Wound #2  Right,Proximal Calcaneous o Topical Lidocaine 4% cream applied to wound bed prior to debridement Akhter, Dequita N. ([id number]) Wound #3 Left,Plantar Toe Great o Topical Lidocaine 4% cream applied to wound bed prior to debridement Wound #4 Left,Dorsal Foot o Topical Lidocaine 4% cream applied to wound bed prior to  debridement Wound #5 Right,Lateral Toe Great o Topical Lidocaine 4% cream applied to wound bed prior to debridement Wound #6 Left,Lateral Foot o Topical Lidocaine 4% cream applied to wound bed prior to debridement Wound #7 Left,Plantar Foot o Topical Lidocaine 4% cream applied to wound bed prior to debridement Primary Wound Dressing Wound #1 Right,Distal Calcaneous o Aquacel Ag Wound #2 Right,Proximal Calcaneous o Aquacel Ag Wound #4 Left,Dorsal Foot o Aquacel Ag Wound #5 Right,Lateral Toe Great o Aquacel Ag Wound #6 Left,Lateral Foot o Aquacel Ag Wound #7 Left,Plantar Foot o Aquacel Ag Wound #3 Left,Plantar Toe Great o Other: - betadine paint Secondary Dressing Wound #1 Right,Distal Calcaneous o Boardered Foam Dressing Wound #2 Right,Proximal Calcaneous o Boardered Foam Dressing Wound #4 Left,Dorsal Foot o Boardered Foam Dressing Wound #5 Right,Lateral Toe Great [place]ott, Marlyn N. ([id number]) o Boardered Foam Dressing Wound #6 Left,Lateral Foot o Boardered Foam Dressing Wound #7 Left,Plantar Foot o Boardered Foam Dressing Wound #3 Left,Plantar Toe Great o Conform/Kerlix Dressing Change Frequency Wound #1 Right,Distal Calcaneous o Change dressing every other day. Wound #2 Right,Proximal Calcaneous o Change dressing every other day. Wound #4 Left,Dorsal Foot o Change dressing every other day. Wound #5 Right,Lateral Toe Great o Change dressing every other day. Wound #6 Left,Lateral Foot o Change dressing every other day. Wound #7 Left,Plantar Foot o Change dressing every other day. Wound #3  Left,Plantar Toe Great o Change dressing every day. Follow-up Appointments Wound #1 Right,Distal Calcaneous o Return Appointment in 1 week. Wound #2 Right,Proximal Calcaneous o Return Appointment in 1 week. Wound #3 Left,Plantar Toe Great o Return Appointment in 1 week. Wound #4 Left,Dorsal Foot o Return Appointment in 1 week. Wound #5 Right,Lateral Toe Great o Return Appointment in 1 week. WILFRED, SIVERSON ([id number]) Wound #6 Left,Lateral Foot o Return Appointment in 1 week. Wound #7 Left,Plantar Foot o Return Appointment in 1 week. Off-Loading Wound #1 Right,Distal Calcaneous o Other: - darco with peg assist Wound #2 Right,Proximal Calcaneous o Other: - darco with peg assist Wound #3 Left,Plantar Toe Great o Other: - darco with peg assist Wound #4 Left,Dorsal Foot o Other: - darco with peg assist Wound #5 Right,Lateral Toe Great o Other: - darco with peg assist Wound #6 Left,Lateral Foot o Other: - darco with peg assist Wound #7 Left,Plantar Foot o Other: - darco with peg assist Electronic Signature(s) Signed: 05/30/2015 12:06:55 PM By: [name] [name], RN Signed: 05/30/2015 4:57:18 PM By: [name] MD, FACS Entered By: [name] on 05/30/2015 12:06:54 [name] ([id number]) -------------------------------------------------------------------------------- Problem List Details [name] Date of Service: 05/30/2015 11:00 AM Patient Name: N. Patient Account Number: [id number] Medical Record [name], RN, [name], [id number] Treating RN: Number: Bertrand Sink April 12, 1981 (33 y.o. Other Clinician: Date of Birth/Sex: Female) [name] Primary Care Physician: [name] Physician/Extender: Referring Physician: [name] in Treatment: 9 Active Problems ICD-10 Encounter Code Description Active Date Diagnosis E10.621 Type 1 diabetes mellitus with foot ulcer 03/28/2015 Yes L97.412 Non-pressure chronic  ulcer of right heel and midfoot with 03/28/2015 Yes fat layer exposed L97.429 Non-pressure chronic ulcer of left heel and midfoot with 05/09/2015 Yes unspecified severity L03.116 Cellulitis of left lower limb 05/09/2015 Yes I70.245 Atherosclerosis of native arteries of left leg with ulceration 05/30/2015 Yes of other part of foot E10.52 Type 1 diabetes mellitus with diabetic peripheral 05/30/2015 Yes angiopathy with gangrene Inactive Problems Resolved Problems Electronic Signature(s) Signed: 05/30/2015 12:14:09 PM By: [name] MD, FACS Entered By: [name] on 05/30/2015 12:14:09 [name] ([id number])  [name], Wu ([id number]) -------------------------------------------------------------------------------- Progress Note Details [name] Date of Service: 05/30/2015 11:00 AM Patient Name: N. Patient Account Number: [id number] Medical Record [name], RN, [name], [id number] Treating RN: Number: [place] Sink [birth year] (33 y.o. Other Clinician: Date of Birth/Sex: Female) [name] Primary Care Physician: [name] Physician/Extender: Referring Physician: [name] in Treatment: 9 Subjective Chief Complaint Information obtained from Patient The patient is back today with 3 weeks treatment at [facility] where she is had several procedures done and was found to ultimately have a dry gangrene of the left forefoot. History of Present Illness (HPI) The following HPI elements were documented for the patient's wound: Location: right heel ulcer Quality: Patient reports No Pain. Severity: Patient states wound are getting worse. Duration: Patient has had the wound for > 2 months prior to seeking treatment at the wound center Context: The wound occurred when the patient injured her foot on something sharp on her deck. Modifying Factors: Consults to this date include:antibiotics which included Keflex in about a month ago and now at present time she is on  clindamycin. Associated Signs and Symptoms: Patient reports having difficulty standing for long periods. Reaghan Kawa is a 34 y.o. female who presents to our wound center referred by her PCP Dr. Zada Finders for nonhealing ulcers on the lateral aspect of the right heel. The patient reports about 10 weeks ago she suffered an injury of the right heel. The wounds got infected, she was given Cephalexin and the use of Bactroban. Of note she has a history of type 1 diabetes mellitus that has been uncontrolled. Past medical history significant for type 1 diabetes mellitus not controlled, ankylosing spondylitis, anorexia nervosa, irritable bowel syndrome, chronic kidney disease, chronic diarrhea. She has been treated by her PCP with duoderm which is to be changed every 3 days. Her last hemoglobin A1c in March was 11. 05/08/2015 -- the patient was seen here once on 03/28/2015 and then once in 04/17/2015. He has been very irregular with her appointments for various reasons. She says about 2 weeks ago she was in a swimming pool and she injured her left big toe and had a blister which she started picking at. She says she's been applying some allergen over this. She's noticed that her toes on this foot and the skin over the dorsum of forefoot have turned red. She does not have any fever or any other significant signs of infection. Addendum: after seeing the patient I have personally spoken to her PCP Dr. [name] and discussed the recent cellulitis she's developed and recommended that she get IV antibiotics and inpatient care for a few days. 05/30/2015 -- the patient is here today with her mother and after her last visit on 8/10 she was seen in the ER at [facility] and they started her on oral antibiotics and got worked up with vascular Melchior, Marigold N. ([id number]) surgery. I understand over the last 3 weeks she's had several procedures done and a possible angioplasty of her left lower  extremity and was found to have dry gangrene of the left big toe. She was asked to follow- up with the wound center today for starting of hyperbaric oxygen therapy as soon as possible. Objective Constitutional Pulse regular. Respirations normal and unlabored. Afebrile. Vitals Time Taken: 11:16 AM, Height: 68 in, Weight: 122 lbs, BMI: 18.5, Temperature: 98.1 F, Pulse: 94 bpm, Respiratory Rate: 18 breaths/min, Blood Pressure: 137/73 mmHg. Eyes Nonicteric. Reactive to light. Ears, Nose, Mouth, and Throat Lips, teeth, and  gums WNL.Marland Kitchen Moist mucosa without lesions . Neck supple and nontender. No palpable supraclavicular or cervical adenopathy. Normal sized without goiter. Respiratory WNL. No retractions.. Cardiovascular Pedal Pulses not palpable. No clubbing, cyanosis or edema. Chest Breasts symmetical and no nipple discharge.. Breast tissue WNL, no masses, lumps, or tenderness.. Lymphatic No adneopathy. No adenopathy. No adenopathy. Musculoskeletal Adexa without tenderness or enlargement.. Digits and nails w/o clubbing, cyanosis, infection, petechiae, ischemia, or inflammatory conditions.Marland Kitchen Psychiatric Judgement and insight Intact.. No evidence of depression, anxiety, or agitation.. General Notes: The right calcaneal wound is about the same and there is minimal slough. the left forefoot shows some evidence of cellulitis but there is a definite dry gangrene of the plantar aspect of the left big Breault, Levern N. ([id number]) toe. Integumentary (Hair, Skin) No suspicious lesions. No crepitus or fluctuance. No peri-wound warmth or erythema. No masses.. Wound #1 status is Open. Original cause of wound was Gradually Appeared. The wound is located on the Right,Distal Calcaneous. The wound measures 0.8cm length x 0.9cm width x 0.1cm depth; 0.565cm^2 area and 0.057cm^3 volume. The wound is limited to skin breakdown. There is no tunneling or undermining noted. There is a small amount of  serous drainage noted. The wound margin is distinct with the outline attached to the wound base. There is medium (34-66%) pink granulation within the wound bed. There is a medium (34-66%) amount of necrotic tissue within the wound bed including Adherent Slough. The periwound skin appearance exhibited: Moist. The periwound skin appearance did not exhibit: Callus, Crepitus, Excoriation, Fluctuance, Friable, Induration, Localized Edema, Rash, Scarring, Dry/Scaly, Maceration, Atrophie Blanche, Cyanosis, Ecchymosis, Hemosiderin Staining, Mottled, Pallor, Rubor, Erythema. Periwound temperature was noted as No Abnormality. Wound #2 status is Open. Original cause of wound was Gradually Appeared. The wound is located on the Right,Proximal Calcaneous. The wound measures 0.3cm length x 0.3cm width x 0.1cm depth; 0.071cm^2 area and 0.007cm^3 volume. The wound is limited to skin breakdown. There is no tunneling or undermining noted. There is a small amount of serous drainage noted. The wound margin is distinct with the outline attached to the wound base. There is small (1-33%) pink granulation within the wound bed. There is a large (67-100%) amount of necrotic tissue within the wound bed including Adherent Slough. The periwound skin appearance exhibited: Moist. The periwound skin appearance did not exhibit: Callus, Crepitus, Excoriation, Fluctuance, Friable, Induration, Localized Edema, Rash, Scarring, Dry/Scaly, Maceration, Atrophie Blanche, Cyanosis, Ecchymosis, Hemosiderin Staining, Mottled, Pallor, Rubor, Erythema. Periwound temperature was noted as No Abnormality. Wound #3 status is Open. Original cause of wound was Shear/Friction. The wound is located on the [employer]. The wound measures 3.5cm length x 3cm width x 0.1cm depth; 8.247cm^2 area and 0.825cm^3 volume. The wound is limited to skin breakdown. There is a none present amount of drainage noted. The wound margin is distinct with the  outline attached to the wound base. There is no granulation within the wound bed. There is a large (67-100%) amount of necrotic tissue within the wound bed including Eschar. The periwound skin appearance exhibited: Dry/Scaly. The periwound skin appearance did not exhibit: Callus, Crepitus, Excoriation, Fluctuance, Friable, Induration, Localized Edema, Rash, Scarring, Maceration, Moist, Atrophie Blanche, Cyanosis, Ecchymosis, Hemosiderin Staining, Mottled, Pallor, Rubor, Erythema. Periwound temperature was noted as No Abnormality. Wound #4 status is Open. Original cause of wound was Gradually Appeared. The wound is located on the Left,Dorsal Foot. The wound measures 0.5cm length x 0.4cm width x 0.1cm depth; 0.157cm^2 area and 0.016cm^3 volume. Wound #5 status is Open.  Original cause of wound was Gradually Appeared. The wound is located on the [employer]. The wound measures 0.8cm length x 0.5cm width x 0.1cm depth; 0.314cm^2 area and 0.031cm^3 volume. The wound is limited to skin breakdown. There is no tunneling or undermining noted. There is a small amount of serous drainage noted. The wound margin is distinct with the outline attached to the wound base. There is large (67-100%) pink granulation within the wound bed. There is no necrotic tissue within the wound bed. The periwound skin appearance exhibited: Moist. The periwound skin appearance did not exhibit: Callus, Crepitus, Excoriation, Fluctuance, Friable, Induration, Localized Edema, Rash, Scarring, Dry/Scaly, Maceration, Atrophie Blanche, Cyanosis, Ecchymosis, Hemosiderin Staining, Mottled, Pallor, Rubor, Erythema. Periwound temperature was noted as No Abnormality. [name], Wu ([id number]) Wound #6 status is Open. Original cause of wound was Gradually Appeared. The wound is located on the Left,Lateral Foot. The wound measures 0.3cm length x 1cm width x 0.1cm depth; 0.236cm^2 area and 0.024cm^3 volume. The wound is  limited to skin breakdown. There is no tunneling or undermining noted. There is a small amount of serous drainage noted. The wound margin is distinct with the outline attached to the wound base. There is large (67-100%) pink granulation within the wound bed. There is no necrotic tissue within the wound bed. The periwound skin appearance exhibited: Moist. The periwound skin appearance did not exhibit: Callus, Crepitus, Excoriation, Fluctuance, Friable, Induration, Localized Edema, Rash, Scarring, Dry/Scaly, Maceration, Atrophie Blanche, Cyanosis, Ecchymosis, Hemosiderin Staining, Mottled, Pallor, Rubor, Erythema. Periwound temperature was noted as No Abnormality. Wound #7 status is Open. Original cause of wound was Gradually Appeared. The wound is located on the Left,Plantar Foot. The wound measures 0.3cm length x 1cm width x 0.1cm depth; 0.236cm^2 area and 0.024cm^3 volume. The wound is limited to skin breakdown. There is no tunneling or undermining noted. There is a small amount of serous drainage noted. The wound margin is distinct with the outline attached to the wound base. There is large (67-100%) pink granulation within the wound bed. There is no necrotic tissue within the wound bed. The periwound skin appearance exhibited: Moist. The periwound skin appearance did not exhibit: Callus, Crepitus, Excoriation, Fluctuance, Friable, Induration, Localized Edema, Rash, Scarring, Dry/Scaly, Maceration, Atrophie Blanche, Cyanosis, Ecchymosis, Hemosiderin Staining, Mottled, Pallor, Rubor, Erythema. Periwound temperature was noted as No Abnormality. 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 She has had vascular procedures over the last 3 week at the [facility] and is now here to start hyperbaric oxygen therapy as soon as possible. 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 She has had all her questions answered and is willing to start as soon as possible. We would recommend basic workup to be done and we will submit her papers to insurance company and start  her on hyperbaric oxygen therapy as soon as possible. [name], Wu ([id number]) Plan Wound Cleansing: Wound #1 Right,Distal Calcaneous: Cleanse wound with mild soap and water May Shower, gently pat wound dry prior to applying new dressing. Wound #2 Right,Proximal Calcaneous: Cleanse wound with mild soap and water May Shower, gently pat wound dry prior to applying new dressing. Wound #3 Left,Plantar Toe Great: Cleanse wound with mild soap and water May Shower, gently pat wound dry prior to applying new dressing. Wound #4 Left,Dorsal Foot: Cleanse wound with mild soap and water May Shower, gently pat wound dry prior to applying new dressing. Wound #5 Right,Lateral Toe Great: Cleanse wound with mild soap and water May Shower, gently pat wound dry prior to applying new dressing. Wound #6 Left,Lateral Foot: Cleanse wound with mild soap and water May Shower, gently pat wound dry prior to applying new dressing. Wound #7 Left,Plantar Foot: Cleanse wound with mild soap and water May Shower, gently pat wound dry prior to applying new  dressing. Anesthetic: Wound #1 Right,Distal Calcaneous: Topical Lidocaine 4% cream applied to wound bed prior to debridement Wound #2 Right,Proximal Calcaneous: Topical Lidocaine 4% cream applied to wound bed prior to debridement Wound #3 Left,Plantar Toe Great: Topical Lidocaine 4% cream applied to wound bed prior to debridement Wound #4 Left,Dorsal Foot: Topical Lidocaine 4% cream applied to wound bed prior to debridement Wound #5 Right,Lateral Toe Great: Topical Lidocaine 4% cream applied to wound bed prior to debridement Wound #6 Left,Lateral Foot: Topical Lidocaine 4% cream applied to wound bed prior to debridement Wound #7 Left,Plantar Foot: Topical Lidocaine 4% cream applied to wound bed prior to debridement Primary Wound Dressing: Wound #1 Right,Distal Calcaneous: Aquacel Ag Wound #2 Right,Proximal Calcaneous: Aquacel Ag Wound #4 Left,Dorsal Foot: Aquacel Ag Wound #5 Right,Lateral Toe Great: Aquacel Ag Wound #6 Left,Lateral Foot: Wu, [name] ([id number]) Aquacel Ag Wound #7 Left,Plantar Foot: Aquacel Ag Wound #3 Left,Plantar Toe Great: Other: - betadine paint Secondary Dressing: Wound #1 Right,Distal Calcaneous: Boardered Foam Dressing Wound #2 Right,Proximal Calcaneous: Boardered Foam Dressing Wound #4 Left,Dorsal Foot: Boardered Foam Dressing Wound #5 Right,Lateral Toe Great: Boardered Foam Dressing Wound #6 Left,Lateral Foot: Boardered Foam Dressing Wound #7 Left,Plantar Foot: Boardered Foam Dressing Wound #3 Left,Plantar Toe Great: Conform/Kerlix Dressing Change Frequency: Wound #1 Right,Distal Calcaneous: Change dressing every other day. Wound #2 Right,Proximal Calcaneous: Change dressing every other day. Wound #4 Left,Dorsal Foot: Change dressing every other day. Wound #5 Right,Lateral Toe Great: Change dressing every other day. Wound #6 Left,Lateral Foot: Change dressing every other day. Wound #7 Left,Plantar Foot: Change dressing  every other day. Wound #3 Left,Plantar Toe Great: Change dressing every day. Follow-up Appointments: Wound #1 Right,Distal Calcaneous: Return Appointment in 1 week. Wound #2 Right,Proximal Calcaneous: Return Appointment in 1 week. Wound #3 Left,Plantar Toe Great: Return Appointment in 1 week. Wound #4 Left,Dorsal Foot: Return Appointment in 1 week. Wound #5 Right,Lateral Toe Great: Return Appointment in 1 week. Wound #6 Left,Lateral Foot: Return Appointment in 1 week. Wound #7 Left,Plantar Foot: Return Appointment in 1 week. 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 She has had vascular procedures over the last 3 week at the [facility] and is now here to start hyperbaric oxygen therapy as soon as possible. 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 She has had all her questions answered and is willing to start as soon as possible. We would recommend basic workup to be done and we will submit her papers to insurance company and start her on hyperbaric oxygen therapy as soon as possible. Electronic Signature(s) Signed: 05/30/2015 12:21:58 PM By: [name] MD, FACS Entered By: [name] on  05/30/2015 12:21:58 [name] ([id number]) -------------------------------------------------------------------------------- Loran Senters Date of Service: 05/30/2015 Patient Name: N. Patient Account Number: [id number] Medical Record [name], RN, [name], [id number] Treating RN: Number: [place] Sink 12-30-1980 (33 y.o. 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52 Type 1 diabetes mellitus with diabetic peripheral angiopathy with gangrene I70.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

## 2015-06-04 ENCOUNTER — Encounter: Payer: Medicare Other | Admitting: Surgery

## 2015-06-04 DIAGNOSIS — L97429 Non-pressure chronic ulcer of left heel and midfoot with unspecified severity: Secondary | ICD-10-CM | POA: Diagnosis not present

## 2015-06-04 LAB — GLUCOSE, CAPILLARY
Glucose-Capillary: 329 mg/dL — ABNORMAL HIGH (ref 65–99)
Glucose-Capillary: 269 mg/dL — ABNORMAL HIGH (ref 65–99)

## 2015-06-04 NOTE — Progress Notes (Signed)
LOUNETTE, SLOAN ([id number]) Visit Report for 06/04/2015 Arrival Information Details Patient Name: [name], [name] Date of Service: 06/04/2015 1:00 PM Medical Record Number: [id number] Patient Account Number: [id number] Date of Birth/Sex: [birth year] (33 y.o. Female) Treating RN: Primary Care Physician: [name] Other Clinician: Referring Physician: [name] Treating Physician/Extender: Rudene Re in Treatment: 9 Visit Information History Since Last Visit Added or deleted any medications: No Patient Arrived: Ambulatory Any new allergies or adverse reactions: No Arrival Time: 13:10 Had a fall or experienced change in No Accompanied By: mother activities of daily living that may affect Transfer Assistance: None risk of falls: Patient Identification Verified: Yes Signs or symptoms of abuse/neglect No Secondary Verification Yes since last visito Process Completed: Hospitalized since last visit: No Patient Requires No Has Dressing in Place as Prescribed: Yes Transmission-Based Has Footwear/Offloading in Place as Yes Precautions: Prescribed: Patient Has Alerts: Yes Left: Surgical Shoe with Patient Alerts: ABI..[place] bilateral Pressure Relief >261mmH Insole Right: Surgical Shoe with Pressure Relief Insole Pain Present Now: No Electronic Signature(s) Signed: 06/04/2015 4:14:41 PM By: Dayton Martes RCP, RRT, CHT Entered By: Dayton Martes on 06/04/2015 13:45:32 [name] ([id number]) -------------------------------------------------------------------------------- Encounter Discharge Information Details Patient Name: [name] Date of Service: 06/04/2015 1:00 PM Medical Record Number: [id number] Patient Account Number: [id number] Date of Birth/Sex: January 25, 1981 (33 y.o. Female) Treating RN: Primary Care Physician: [name] Other Clinician: Referring Physician: [name] Treating  Physician/Extender: Rudene Re in Treatment: 9 Encounter Discharge Information Items Discharge Pain Level: 0 Discharge Condition: Stable Ambulatory Status: Ambulatory Discharge Destination: Home Private Transportation: Auto Accompanied By: mother Schedule Follow-up Appointment: No Medication Reconciliation completed and No provided to Patient/Care Sherena Machorro: Clinical Summary of Care: Notes Patient has an HBO treatment scheduled on 06/05/15 at 13:00 pm. Electronic Signature(s) Signed: 06/04/2015 4:14:41 PM By: Dayton Martes RCP, RRT, CHT Entered By: Dayton Martes on 06/04/2015 15:58:26 [name] ([id number]) -------------------------------------------------------------------------------- Vitals Details Patient Name: [name] Date of Service: 06/04/2015 1:00 PM Medical Record Number: [id number] Patient Account Number: [id number] Date of Birth/Sex: Feb 21, 1981 (33 y.o. Female) Treating RN: Primary Care Physician: [name] Other Clinician: Referring Physician: [name] Treating Physician/Extender: Rudene Re in Treatment: 9 Vital Signs Time Taken: 13:14 Temperature (F): 97.9 Height (in): 68 Pulse (bpm): 88 Weight (lbs): 122 Respiratory Rate (breaths/min): 18 Body Mass Index (BMI): 18.5 Blood Pressure (mmHg): 122/84 Capillary Blood Glucose (mg/dl): 832 Reference Range: 80 - 120 mg / dl Electronic Signature(s) Signed: 06/04/2015 4:14:41 PM By: Dayton Martes RCP, RRT, CHT Entered By: Dayton Martes on 06/04/2015 13:46:06

## 2015-06-04 NOTE — Progress Notes (Signed)
CLAIRISSA, VALVANO ([id number]) Visit Report for 06/04/2015 HBO Details Patient Name: [name], [name] Date of Service: 06/04/2015 1:00 PM Medical Record Number: [id number] Patient Account Number: [id number] Date of Birth/Sex: 02/05/1981 (33 y.o. Female) Treating RN: Primary Care Physician: [name] Other Clinician: Referring Physician: [name] Treating Physician/Extender: Rudene Re in Treatment: 9 HBO Treatment Course Details Treatment Course Ordering Physician: [name] 1 Number: HBO Treatment Start Date: 06/04/2015 Total Treatments 40 Ordered: HBO Indication: Other (specify in Notes) HBO Treatment Details Treatment Number: 1 Patient Type: Outpatient Chamber Type: Monoplace Chamber #: HBO #[id number] Treatment Protocol: 2.0 ATA with 90 minutes oxygen, and no air breaks Treatment Details Compression Rate Down: 1.5 psi / minute De-Compression Rate Up: 1.5 psi / minute Air breaks and breathing Compress Tx Pressure Decompress Decompress periods Begins Reached Begins Ends (leave unused spaces blank) Chamber Pressure 1 ATA 2.0 ATA - - - - - - 2.0 ATA 1 ATA Clock Time (24 hr) 13:33 13:43 - - - - - - 15:13 15:23 Treatment Length: 110 (minutes) Treatment Segments: 4 Capillary Blood Glucose Pre Capillary Blood Glucose (mg/dl): Post Capillary Blood Glucose (mg/dl): Vital Signs Capillary Blood Glucose Reference Range: 80 - 120 mg / dl HBO Diabetic Blood Glucose Intervention Range: <131 mg/dl or >680 mg/dl Time Vitals Blood Respiratory Capillary Blood Glucose Pulse Action Type: Pulse: Temperature: Taken: Pressure: Rate: Glucose (mg/dl): Meter #: Oximetry (%) Taken: Pre 13:14 122/84 88 18 97.9 329 1 none Post 15:30 112/68 84 18 98 269 1 none Treatment Response Treatment Completion Status: Treatment Completed without Adverse Event Bonini, Corinn N. ([id number]) Treatment Notes Patient is receiving HBO treatments for a Wagner Grade IV diabetic  foot ulcer with gangrene. Electronic Signature(s) Signed: 06/04/2015 4:14:41 PM By: Sallee Provencal, RRT, CHT Signed: 06/04/2015 4:40:03 PM By: [name] MD, FACS Previous Signature: 06/04/2015 3:55:12 PM Version By: [name] MD, FACS Entered By: Dayton Martes on 06/04/2015 15:57:38 [name] ([id number]) -------------------------------------------------------------------------------- HBO Safety Checklist Details Patient Name: [name] Date of Service: 06/04/2015 1:00 PM Medical Record Number: [id number] Patient Account Number: [id number] Date of Birth/Sex: 1981/07/07 (33 y.o. Female) Treating RN: Primary Care Physician: [name] Other Clinician: Referring Physician: [name] Treating Physician/Extender: Rudene Re in Treatment: 9 HBO Safety Checklist Items Safety Checklist Consent Form Signed Patient voided / foley secured and emptied When did you last eato 19:00 pm on 06/03/15 Last dose of injectable or oral agent 22:00 pm on 06/03/15 NA Ostomy pouch emptied and vented if applicable NA All implantable devices assessed, documented and approved Intravenous access site secured and place Valuables secured Linens and cotton and cotton/polyester blend (less than 51% polyester) Personal oil-based products / skin lotions / body lotions removed Wigs or hairpieces removed Smoking or tobacco materials removed Books / newspapers / magazines / loose paper removed Cologne, aftershave, perfume and deodorant removed Jewelry removed (may wrap wedding band) Make-up removed Hair care products removed Battery operated devices (external) removed Heating patches and chemical warmers removed NA Titanium eyewear removed Nail polish cured greater than 10 hours NA Casting material cured greater than 10 hours NA Hearing aids removed NA Loose dentures or partials removed NA Prosthetics have been removed Patient demonstrates  correct use of air break device (if applicable) Patient concerns have been addressed Patient grounding bracelet on and cord attached to chamber Specifics for Inpatients (complete in addition to above) Medication sheet sent with patient Intravenous medications needed or due during therapy sent with patient Pickup, Patt  N. ([id number]) Drainage tubes (e.g. nasogastric tube or chest tube secured and vented) Endotracheal or Tracheotomy tube secured Cuff deflated of air and inflated with saline Airway suctioned Electronic Signature(s) Signed: 06/04/2015 4:14:41 PM By: Dayton Martes RCP, RRT, CHT Entered By: Dayton Martes on 06/04/2015 13:48:50

## 2015-06-05 ENCOUNTER — Encounter: Payer: Medicare Other | Admitting: Surgery

## 2015-06-05 DIAGNOSIS — L97429 Non-pressure chronic ulcer of left heel and midfoot with unspecified severity: Secondary | ICD-10-CM | POA: Diagnosis not present

## 2015-06-05 LAB — GLUCOSE, CAPILLARY: Glucose-Capillary: 152 mg/dL — ABNORMAL HIGH (ref 65–99)

## 2015-06-06 ENCOUNTER — Encounter: Payer: Medicare Other | Admitting: Surgery

## 2015-06-06 ENCOUNTER — Ambulatory Visit: Payer: Medicare Other | Admitting: Surgery

## 2015-06-06 DIAGNOSIS — L97429 Non-pressure chronic ulcer of left heel and midfoot with unspecified severity: Secondary | ICD-10-CM | POA: Diagnosis not present

## 2015-06-06 LAB — GLUCOSE, CAPILLARY: Glucose-Capillary: 265 mg/dL — ABNORMAL HIGH (ref 65–99)

## 2015-06-06 NOTE — Progress Notes (Signed)
[name], [name] ([id number]) Visit Report for 06/05/2015 HBO Details [name], [name] Date of Service: 06/05/2015 1:00 PM Patient Name: N. Patient Account Number: [id number] Medical Record Treating RN: [id number] Number: Other Clinician: Izetta Dakin August 21, 1981 (33 y.o. Treating BURNS III, Date of Birth/Sex: Female) Physician/Extender: Zollie Beckers Primary Care [name] Physician: Referring Physician: [name] in Treatment: 9 HBO Treatment Course Details Treatment Course Ordering Physician: [name] 1 Number: HBO Treatment Start Date: 06/04/2015 Total Treatments 40 Ordered: HBO Indication: Other (specify in Notes) HBO Treatment Details Treatment Treatment Aborted/Not Restarted: Patient Type: Outpatient Number: Adverse Event Chamber #: HBO #[id number] Chamber Type: Monoplace Treatment Protocol: 2.0 ATA with 90 minutes oxygen, and no air breaks Treatment Details Compression Rate Down: 1.5 psi / minute De-Compression Rate Up: Air breaks and breathing Compress Tx Pressure Decompress Decompress periods Begins Reached Begins Ends (leave unused spaces blank) Chamber Pressure 1 ATA 2.0 ATA - - - - - - 2.0 ATA 1 ATA Clock Time (24 hr) 13:04 - - - - - - - - 13:15 Treatment Length: 11 (minutes) Treatment Segments: 0 Capillary Blood Glucose Pre Capillary Blood Glucose (mg/dl): Post Capillary Blood Glucose (mg/dl): Vital Signs Capillary Blood Glucose Reference Range: 80 - 120 mg / dl HBO Diabetic Blood Glucose Intervention Range: <131 mg/dl or >338 mg/dl Time Vitals Blood Respiratory Capillary Blood Glucose Pulse Action Type: Pulse: Temperature: Taken: Pressure: Rate: Glucose (mg/dl): Meter #: Oximetry (%) Taken: Pre 12:44 101/77 90 18 98.4 152 1 none [name], [name] ([id number]) Treatment Response Adverse Events: 1:Barotrauma - Ear Treatment Completion Status: Treatment Aborted/Not Restarted Reason: Adverse Event Treatment Notes Patient  complained of intense right ear pain at 1.35 ATA. Pressure was leveled off at that point as patient attempted to clear her right ear but with no success. Decompression of the chamber was begun at this point. Dr. Lawerance Bach was notified when the patient was out and examined both ears. The right ear had some redness and only a minimal amount of fluid behind the eardrum. Dr. Lawerance Bach gave order to call for a consult with [facility] to see if PE tube(s) will be needed for patient to resume her HBO treatment course. [facility] was called and the patient and her mother chose to wait here until ENT calls back around 2:00 pm when the on call physician comes into the office. I stressed that we needed the patient to be seen ASAP as her diagnosis for HBO therapy makes it critical that she receive her treatments in a timely manner. Physician Notes Patient complained of right ear pain during compression. HBO discontinued. Right ear with erythema. No significant fluid. No perforation. Left ear normal. We'll refer to ENT for consideration of PE tubes. Post Treatment Teed Score Post Treatment Teed Score: Left Ear Grade 0 Post Treatment Teed Score: Right Ear Grade II HBO Attestation I certify that I supervised this HBO treatment in accordance with Medicare guidelines. A trained Yes emergency response team is readily available per hospital policies and procedures. Continue HBOT as ordered. No Electronic Signature(s) Signed: 06/05/2015 4:02:17 PM By: Madelaine Bhat MD Entered By: Madelaine Bhat on 06/05/2015 15:32:14 [name] ([id number]) -------------------------------------------------------------------------------- HBO Safety Checklist Details [name] Date of Service: 06/05/2015 1:00 PM Patient Name: N. Patient Account Number: [id number] Medical Record Treating RN: [id number] Number: Other Clinician: Izetta Dakin [birth year] (33 y.o. Treating BURNS III, Date of  Birth/Sex: Female) Physician/Extender: Zollie Beckers Primary Care [name] Physician: Referring Physician: [name] in Treatment: 9 HBO Safety Checklist  Items Safety Checklist Consent Form Signed Patient voided / foley secured and emptied When did you last eato 20:30 pm on 06/04/15 Last dose of injectable or oral agent 20:30 on 06/04/15 NA Ostomy pouch emptied and vented if applicable NA All implantable devices assessed, documented and approved Intravenous access site secured and place Valuables secured Linens and cotton and cotton/polyester blend (less than 51% polyester) Personal oil-based products / skin lotions / body lotions removed Wigs or hairpieces removed Smoking or tobacco materials removed Books / newspapers / magazines / loose paper removed Cologne, aftershave, perfume and deodorant removed Jewelry removed (may wrap wedding band) Make-up removed Hair care products removed Battery operated devices (external) removed Heating patches and chemical warmers removed NA Titanium eyewear removed Nail polish cured greater than 10 hours NA Casting material cured greater than 10 hours NA Hearing aids removed NA Loose dentures or partials removed NA Prosthetics have been removed Patient demonstrates correct use of air break device (if applicable) Patient concerns have been addressed Patient grounding bracelet on and cord attached to chamber Specifics for Inpatients (complete in addition to above) [name], [name] ([id number]) Medication sheet sent with patient Intravenous medications needed or due during therapy sent with patient Drainage tubes (e.g. nasogastric tube or chest tube secured and vented) Endotracheal or Tracheotomy tube secured Cuff deflated of air and inflated with saline Airway suctioned Electronic Signature(s) Signed: 06/05/2015 4:06:40 PM By: Dayton Martes RCP, RRT, CHT Entered By: Weyman Rodney, Lucio Edward on 06/05/2015  13:46:07

## 2015-06-06 NOTE — Progress Notes (Signed)
[name] ([id number]) Visit Report for 06/05/2015 Arrival Information Details [name] Date of Service: 06/05/2015 1:00 PM Patient Name: N. Patient Account Number: [id number] Medical Record Treating RN: [id number] Number: Other Clinician: Izetta Dakin [birth year] (33 y.o. Treating BURNS III, Date of Birth/Sex: Female) Physician/Extender: Zollie Beckers Primary Care [name] Physician: Referring Physician: [name] in Treatment: 9 Visit Information History Since Last Visit Added or deleted any medications: No Patient Arrived: Ambulatory Any new allergies or adverse reactions: No Arrival Time: 12:40 Had a fall or experienced change in No Accompanied By: mother activities of daily living that may affect Transfer Assistance: None risk of falls: Patient Identification Verified: Yes Signs or symptoms of abuse/neglect No Secondary Verification Yes since last visito Process Completed: Hospitalized since last visit: No Patient Requires No Has Dressing in Place as Prescribed: Yes Transmission-Based Has Footwear/Offloading in Place as Yes Precautions: Prescribed: Patient Has Alerts: Yes Left: Surgical Shoe with Patient Alerts: ABI..[place] bilateral Pressure Relief >271mmH Insole Right: Surgical Shoe with Pressure Relief Insole Pain Present Now: No Electronic Signature(s) Signed: 06/05/2015 4:06:40 PM By: [name] RCP, RRT, CHT Entered By: [name] on 06/05/2015 13:38:03 [name] ([id number]) -------------------------------------------------------------------------------- Encounter Discharge Information Details [name] Date of Service: 06/05/2015 1:00 PM Patient Name: N. Patient Account Number: [id number] Medical Record Treating RN: [id number] Number: Other Clinician: Izetta Dakin December 22, 1980 (33 y.o. Treating BURNS III, Date of Birth/Sex: Female) Physician/Extender: Zollie Beckers Primary  Care [name] Physician: Referring Physician: [name] in Treatment: 9 Encounter Discharge Information Items Discharge Pain Level: 0 Discharge Condition: Stable Ambulatory Status: Ambulatory Discharge Destination: Home Private Transportation: Auto Accompanied By: mother Schedule Follow-up Appointment: No Medication Reconciliation completed and No provided to Patient/Care [place]: Clinical Summary of Care: Notes Next treatment pending consult with ENT physician at [facility]. Electronic Signature(s) Signed: 06/05/2015 4:06:40 PM By: [name] RCP, RRT, CHT Entered By: [name] on 06/05/2015 13:54:29 [name] ([id number]) -------------------------------------------------------------------------------- Vitals Details [name] Date of Service: 06/05/2015 1:00 PM Patient Name: N. Patient Account Number: [id number] Medical Record Treating RN: [id number] Number: Other Clinician: Izetta Dakin 1981/01/13 (33 y.o. Treating BURNS III, Date of Birth/Sex: Female) Physician/Extender: Zollie Beckers Primary Care [name] Physician: Referring Physician: [name] in Treatment: 9 Vital Signs Time Taken: 12:44 Temperature (F): 98.4 Height (in): 68 Pulse (bpm): 90 Weight (lbs): 122 Respiratory Rate (breaths/min): 18 Body Mass Index (BMI): 18.5 Blood Pressure (mmHg): 101/77 Capillary Blood Glucose (mg/dl): 225 Reference Range: 80 - 120 mg / dl Electronic Signature(s) Signed: 06/05/2015 4:06:40 PM By: [name] RCP, RRT, CHT Entered By: [name] on 06/05/2015 13:38:46

## 2015-06-07 ENCOUNTER — Encounter: Payer: Medicare Other | Admitting: Surgery

## 2015-06-07 DIAGNOSIS — L97429 Non-pressure chronic ulcer of left heel and midfoot with unspecified severity: Secondary | ICD-10-CM | POA: Diagnosis not present

## 2015-06-07 LAB — GLUCOSE, CAPILLARY
Glucose-Capillary: 453 mg/dL — ABNORMAL HIGH (ref 65–99)
Glucose-Capillary: 475 mg/dL — ABNORMAL HIGH (ref 65–99)

## 2015-06-07 NOTE — Progress Notes (Signed)
NECHUMA, BOVEN ([id number]) Visit Report for 06/06/2015 HBO Details Patient Name: [name], [name] Date of Service: 06/06/2015 1:00 PM Medical Record Number: [id number] Patient Account Number: [id number] Date of Birth/Sex: 10-23-1980 (33 y.o. Female) Treating RN: Primary Care Physician: [name] Other Clinician: Referring Physician: [name] Treating Physician/Extender: Rudene Re in Treatment: 10 HBO Treatment Course Details Treatment Course Ordering Physician: [name] 1 Number: HBO Treatment Start Date: 06/04/2015 Total Treatments 40 Ordered: HBO Indication: Other (specify in Notes) HBO Treatment Details Treatment Treatment Aborted/Not Restarted: Patient Type: Outpatient Number: Adverse Event Chamber #: HBO #[id number] Chamber Type: Monoplace Treatment Protocol: 2.0 ATA with 90 minutes oxygen, and no air breaks Treatment Details Compression Rate Down: 1.5 psi / minute De-Compression Rate Up: 1.5 psi / minute Air breaks and breathing Compress Tx Pressure Decompress Decompress periods Begins Reached Begins Ends (leave unused spaces blank) Chamber Pressure 1 ATA 2.0 ATA - - - - - - 2.0 ATA 1 ATA Clock Time (24 hr) 12:00 - - - - - - - - 12:06 Treatment Length: 6 (minutes) Treatment Segments: 0 Capillary Blood Glucose Pre Capillary Blood Glucose (mg/dl): Post Capillary Blood Glucose (mg/dl): Vital Signs Capillary Blood Glucose Reference Range: 80 - 120 mg / dl HBO Diabetic Blood Glucose Intervention Range: <131 mg/dl or >836 mg/dl Time Vitals Blood Respiratory Capillary Blood Glucose Pulse Action Type: Pulse: Temperature: Taken: Pressure: Rate: Glucose (mg/dl): Meter #: Oximetry (%) Taken: Pre 11:48 103/63 83 18 98.5 265 1 none Treatment Response Adverse Events: [name], [name] ([id number]) 1:Barotrauma - Ear Treatment Completion Status: Treatment Aborted/Not Restarted Reason: Adverse Event Treatment Notes Patient complained  of intense ear pain at 1.11 ATA today and was immediately brought out of the chamber upon full decompression. Patient went [facility] today for a consult and Dr. Bud Face prescribed inhaled nasal decongestants and was confident she would be ok for HBO. He has her scheduled for Friday, 06/07/15 at 10:15 for PE tube placement in her right ear. Electronic Signature(s) Signed: 06/06/2015 3:11:35 PM By: Sallee Provencal, RRT, CHT Signed: 06/06/2015 4:23:40 PM By: [name] MD, FACS Entered By: Dayton Martes on 06/06/2015 13:16:39 [name] ([id number]) -------------------------------------------------------------------------------- HBO Safety Checklist Details Patient Name: [name] Date of Service: 06/06/2015 1:00 PM Medical Record Number: [id number] Patient Account Number: [id number] Date of Birth/Sex: [birth year] (33 y.o. Female) Treating RN: Primary Care Physician: [name] Other Clinician: Referring Physician: [name] Treating Physician/Extender: Rudene Re in Treatment: 10 HBO Safety Checklist Items Safety Checklist Consent Form Signed Patient voided / foley secured and emptied When did you last eato 20:00 pm on 06/05/15 Last dose of injectable or oral agent 20:00 pm on 06/05/15 NA Ostomy pouch emptied and vented if applicable NA All implantable devices assessed, documented and approved Intravenous access site secured and place Valuables secured Linens and cotton and cotton/polyester blend (less than 51% polyester) Personal oil-based products / skin lotions / body lotions removed Wigs or hairpieces removed Smoking or tobacco materials removed Books / newspapers / magazines / loose paper removed Cologne, aftershave, perfume and deodorant removed Jewelry removed (may wrap wedding band) Make-up removed Hair care products removed Battery operated devices (external) removed Heating patches and chemical  warmers removed NA Titanium eyewear removed Nail polish cured greater than 10 hours NA Casting material cured greater than 10 hours NA Hearing aids removed NA Loose dentures or partials removed NA Prosthetics have been removed Patient demonstrates correct use of air break device (if applicable) Patient  concerns have been addressed Patient grounding bracelet on and cord attached to chamber Specifics for Inpatients (complete in addition to above) Medication sheet sent with patient Intravenous medications needed or due during therapy sent with patient [name], [name] ([id number]) Drainage tubes (e.g. nasogastric tube or chest tube secured and vented) Endotracheal or Tracheotomy tube secured Cuff deflated of air and inflated with saline Airway suctioned Electronic Signature(s) Signed: 06/06/2015 3:11:35 PM By: Dayton Martes RCP, RRT, CHT Entered By: Dayton Martes on 06/06/2015 12:47:32

## 2015-06-07 NOTE — Progress Notes (Signed)
[name], [name] ([id number]) Visit Report for 06/06/2015 Arrival Information Details Patient Name: [name], [name] Date of Service: 06/06/2015 1:00 PM Medical Record Number: [id number] Patient Account Number: [id number] Date of Birth/Sex: 12/21/1980 (34 y.o. Female) Treating RN: Primary Care Physician: [name] Other Clinician: Referring Physician: [name] Treating Physician/Extender: Rudene Re in Treatment: 10 Visit Information History Since Last Visit Added or deleted any medications: No Patient Arrived: Ambulatory Any new allergies or adverse reactions: No Arrival Time: 11:45 Had a fall or experienced change in No Accompanied By: mother activities of daily living that may affect Transfer Assistance: None risk of falls: Patient Identification Verified: Yes Signs or symptoms of abuse/neglect since last No Secondary Verification Process Yes visito Completed: Hospitalized since last visit: No Patient Requires Transmission- No Has Dressing in Place as Prescribed: Yes Based Precautions: Pain Present Now: No Patient Has Alerts: Yes Patient Alerts: ABI..[place] bilateral >249mmH Electronic Signature(s) Signed: 06/06/2015 3:11:35 PM By: Dayton Martes RCP, RRT, CHT Entered By: Dayton Martes on 06/06/2015 12:45:43 [name] ([id number]) -------------------------------------------------------------------------------- Clinic Level of Care Assessment Details Patient Name: [name] Date of Service: 06/06/2015 1:00 PM Medical Record Number: [id number] Patient Account Number: [id number] Date of Birth/Sex: 29-Sep-1980 (34 y.o. Female) Treating RN: Primary Care Physician: [name] Other Clinician: Referring Physician: [name] Treating Physician/Extender: Rudene Re in Treatment: 10 Clinic Level of Care Assessment Items TOOL 4 Quantity Score []  - Use when only an EandM is performed on  FOLLOW-UP visit 0 ASSESSMENTS - Nursing Assessment / Reassessment []  - Reassessment of Co-morbidities (includes updates in patient status) 0 []  - Reassessment of Adherence to Treatment Plan 0 ASSESSMENTS - Wound and Skin Assessment / Reassessment []  - Simple Wound Assessment / Reassessment - one wound 0 []  - Complex Wound Assessment / Reassessment - multiple wounds 0 []  - Dermatologic / Skin Assessment (not related to wound area) 0 ASSESSMENTS - Focused Assessment []  - Circumferential Edema Measurements - multi extremities 0 []  - Nutritional Assessment / Counseling / Intervention 0 []  - Lower Extremity Assessment (monofilament, tuning fork, pulses) 0 []  - Peripheral Arterial Disease Assessment (using hand held doppler) 0 ASSESSMENTS - Ostomy and/or Continence Assessment and Care []  - Incontinence Assessment and Management 0 []  - Ostomy Care Assessment and Management (repouching, etc.) 0 PROCESS - Coordination of Care []  - Simple Patient / Family Education for ongoing care 0 []  - Complex (extensive) Patient / Family Education for ongoing care 0 []  - Staff obtains , Records, Test Results / Process Orders 0 []  - Staff telephones HHA, Nursing Homes / Clarify orders / etc 0 []  - Routine Transfer to another Facility (non-emergent condition) 0 Molinelli, Chere N. ( ) []  - Routine Hospital Admission (non-emergent condition) 0 []  - New Admissions / / Ordering NPWT, Apligraf, etc. 0 []  - Emergency Hospital Admission (emergent condition) 0 []  - Simple Discharge Coordination 0 []  - Complex (extensive) Discharge Coordination 0 PROCESS - Special Needs []  - Pediatric / Minor Patient Management 0 []  - Isolation Patient Management 0 []  - Hearing / Language / Visual special needs 0 []  - Assessment of Community assistance (transportation, D/C planning, etc.) 0 []  - Additional assistance / Altered mentation 0 []  - Support Surface(s) Assessment (bed, cushion,  seat, etc.) 0 INTERVENTIONS - Wound Cleansing / Measurement []  - Simple Wound Cleansing - one wound 0 []  - Complex Wound Cleansing - multiple wounds 0 []  - Wound Imaging (photographs - any number of wounds) 0 []  - Wound  Tracing (instead of photographs) 0 []  - Simple Wound Measurement - one wound 0 []  - Complex Wound Measurement - multiple wounds 0 INTERVENTIONS - Wound Dressings []  - Small Wound Dressing one or multiple wounds 0 []  - Medium Wound Dressing one or multiple wounds 0 []  - Large Wound Dressing one or multiple wounds 0 []  - Application of Medications - topical 0 []  - Application of Medications - injection 0 INTERVENTIONS - Miscellaneous []  - External ear exam 0 Jun, Izzah N. ( ) []  - Specimen Collection (cultures, biopsies, blood, body fluids, etc.) 0 []  - Specimen(s) / Culture(s) sent or taken to Lab for analysis 0 []  - Patient Transfer (multiple staff / Lift / Similar devices) 0 []  - Simple Staple / Suture removal (25 or less) 0 []  - Complex Staple / Suture removal (26 or more) 0 X - Hypo / Hyperglycemic Management (close monitor of Blood Glucose) 1 10 []  - Ankle / Brachial Index (ABI) - do not check if billed separately 0 X - Vital Signs 1 5 Has the patient been seen at the hospital within the last three years: Yes Total Score: 15 Level Of Care: New/Established - Level 1 Electronic Signature(s) Signed: 06/06/2015 3:11:35 PM By: RCP, RRT, CHT Entered By: on 06/06/2015 13:17:24 ( ) -------------------------------------------------------------------------------- Encounter Discharge Information Details Patient Name: [id number] Date of Service: 06/06/2015 1:00 PM Medical Record Number: Patient Account Number: Date of Birth/Sex: December 12, 1980 (34 y.o. Female) Treating RN: Primary Care Physician: Other Clinician: Referring  Physician: Treating Physician/Extender: 08/06/2015 in Treatment: 10 Encounter Discharge Information Items Discharge Pain Level: 0 Discharge Condition: Stable Ambulatory Status: Ambulatory Discharge Destination: Home Private Transportation: Auto Accompanied By: mother Schedule Follow-up Appointment: No Medication Reconciliation completed and No provided to Patient/Care Marchele Decock: Clinical Summary of Care: Notes Patient has an HBO treatment scheduled on 06/07/15 at 13:00 pm. Electronic Signature(s) Signed: 06/06/2015 3:11:35 PM By: 08/06/2015 RCP, RRT, CHT Entered By: [name] on 06/06/2015 13:18:16 [name] (08/06/2015) -------------------------------------------------------------------------------- Vitals Details Patient Name: [id number] Date of Service: 06/06/2015 1:00 PM Medical Record Number: 09/09/1981 Patient Account Number: [id number] Date of Birth/Sex: 1981/08/25 (34 y.o. Female) Treating RN: Primary Care Physician: Rudene Re Other Clinician: Referring Physician: 08/07/15 Treating Physician/Extender: 08/06/2015 in Treatment: 10 Vital Signs Time Taken: 11:48 Temperature (F): 98.5 Height (in): 68 Pulse (bpm): 83 Weight (lbs): 122 Respiratory Rate (breaths/min): 18 Body Mass Index (BMI): 18.5 Blood Pressure (mmHg): 103/63 Capillary Blood Glucose (mg/dl): Dayton Martes Reference Range: 80 - 120 mg / dl Electronic Signature(s) Signed: 06/06/2015 3:11:35 PM By: 08/06/2015 RCP, RRT, CHT Entered By: [name] on 06/06/2015 12:46:10

## 2015-06-08 NOTE — Progress Notes (Signed)
KENORA, SPAYD ([id number]) Visit Report for 06/07/2015 Arrival Information Details Patient Name: [name], [name] Date of Service: 06/07/2015 1:00 PM Medical Record Number: [id number] Patient Account Number: [id number] Date of Birth/Sex: [birth year] (33 y.o. Female) Treating RN: Primary Care Physician: [name] Other Clinician: Izetta Dakin Referring Physician: [name] Treating Physician/Extender: Rudene Re in Treatment: 10 Visit Information History Since Last Visit Added or deleted any medications: No Patient Arrived: Ambulatory Any new allergies or adverse reactions: No Arrival Time: 12:50 Had a fall or experienced change in No Accompanied By: mother activities of daily living that may affect Transfer Assistance: None risk of falls: Patient Identification Verified: Yes Signs or symptoms of abuse/neglect since last No Secondary Verification Process Yes visito Completed: Hospitalized since last visit: No Patient Requires Transmission- No Has Dressing in Place as Prescribed: Yes Based Precautions: Pain Present Now: No Patient Has Alerts: Yes Patient Alerts: ABI..[place] bilateral >238mmH Electronic Signature(s) Signed: 06/07/2015 3:23:14 PM By: Dayton Martes RCP, RRT, CHT Entered By: Dayton Martes on 06/07/2015 12:59:18 [name] ([id number]) -------------------------------------------------------------------------------- Encounter Discharge Information Details Patient Name: [name] Date of Service: 06/07/2015 1:00 PM Medical Record Number: [id number] Patient Account Number: [id number] Date of Birth/Sex: 17-Aug-1981 (33 y.o. Female) Treating RN: Primary Care Physician: [name] Other Clinician: Referring Physician: [name] Treating Physician/Extender: Rudene Re in Treatment: 10 Encounter Discharge Information Items Discharge Pain Level: 0 Discharge Condition:  Stable Ambulatory Status: Ambulatory Discharge Destination: Home Private Transportation: Auto Accompanied By: mother Schedule Follow-up Appointment: No Medication Reconciliation completed and No provided to Patient/Care [name]: Clinical Summary of Care: Notes Patient has an HBO treatment scheduled on 06/10/15 at 13:00 pm. Electronic Signature(s) Signed: 06/07/2015 3:23:14 PM By: Dayton Martes RCP, RRT, CHT Entered By: Dayton Martes on 06/07/2015 15:22:56 [name] ([id number]) -------------------------------------------------------------------------------- Vitals Details Patient Name: [name] Date of Service: 06/07/2015 1:00 PM Medical Record Number: [id number] Patient Account Number: [id number] Date of Birth/Sex: 1981/07/01 (33 y.o. Female) Treating RN: Primary Care Physician: [name] Other Clinician: Referring Physician: [name] Treating Physician/Extender: Rudene Re in Treatment: 10 Vital Signs Time Taken: 12:50 Temperature (F): 98.3 Height (in): 68 Pulse (bpm): 89 Weight (lbs): 122 Respiratory Rate (breaths/min): 18 Body Mass Index (BMI): 18.5 Blood Pressure (mmHg): 122/66 Capillary Blood Glucose (mg/dl): 222 Reference Range: 80 - 120 mg / dl Electronic Signature(s) Signed: 06/07/2015 3:23:14 PM By: Dayton Martes RCP, RRT, CHT Entered By: Weyman Rodney, Lucio Edward on 06/07/2015 12:59:43

## 2015-06-08 NOTE — Progress Notes (Signed)
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 Rudene Re in Treatment: 10 Wound Status Wound Number: 6 Primary Etiology: Skin Tear Wound Location: Left, Lateral Foot Wound Status: Open Wounding Event: Gradually Appeared Date Acquired: 05/28/2015 Weeks Of Treatment: 1 Clustered Wound: No Photos Photo Uploaded By: Elliot Gurney, RN, [name], Kim on 06/07/2015 17:00:59 Wound Measurements Length: (cm) 1 Width: (cm) 1.5 Depth: (cm) 0.1 Area: (cm) 1.178 Volume: (cm) 0.118 % Reduction in Area: -399.2% % Reduction in Volume: -391.7% Wound Description Classification: Partial Thickness Periwound Skin  Texture Texture Color No Abnormalities Noted: No No Abnormalities Noted: No Moisture No Abnormalities Noted: No Treatment Notes Wound #6 (Left, Lateral Foot) 4. Dressing Applied: UMU, SCHILLO ([id number]) Other dressing (specify in notes) 5. Secondary Dressing Applied Gauze and Kerlix/Conform Notes betadine paint Electronic Signature(s) Signed: 06/07/2015 5:06:19 PM By: Elliot Gurney, RN, [name], Kim RN, [name] Entered By: Elliot Gurney, RN, [name], Kim on 06/07/2015 15:45:09 [name] ([id number]) -------------------------------------------------------------------------------- Wound Assessment Details Patient Name: [name] Date of Service: 06/07/2015 3:15 PM Medical Record Number: [id number] Patient Account Number: [id number] Date of Birth/Sex: [birth year] (33 y.o. Female) Treating RN: Huel Coventry Primary Care Physician: [name] Other Clinician: Referring Physician: [name] Treating Physician/Extender: Rudene Re in Treatment: 10 Wound Status Wound Number: 7 Primary Etiology: Skin Tear Wound Location: Left, Plantar Foot Wound Status: Open Wounding Event: Gradually Appeared Date Acquired: 05/29/2015 Weeks Of Treatment: 1 Clustered Wound: No Photos Photo Uploaded By: Elliot Gurney, RN, [name], Kim on 06/07/2015 17:01:30 Wound Measurements Length: (cm) 0.4 Width: (cm) 0.6 Depth: (cm) 0.1 Area: (cm) 0.188 Volume: (cm) 0.019 % Reduction in Area: 20.3% % Reduction in Volume: 20.8% Wound Description Classification: Partial Thickness Periwound Skin Texture Texture Color No Abnormalities Noted: No No Abnormalities Noted: No Moisture No Abnormalities Noted: No Treatment Notes Wound #7 (Left, Plantar Foot) 4. Dressing Applied: STEPHANNE, SALVIA ([id number]) Other dressing (specify in notes) 5. Secondary Dressing Applied Gauze and Kerlix/Conform Notes betadine paint Electronic Signature(s) Signed: 06/07/2015 5:06:19 PM By: Elliot Gurney, RN, [name], Kim  RN, [name] Entered By: Elliot Gurney, RN, [name], Kim on 06/07/2015 15:45:09 [name] ([id number]) -------------------------------------------------------------------------------- Wound Assessment Details Patient Name: [name] Date  of Service: 06/07/2015 3:15 PM Medical Record Number: [id number] Patient Account Number: [id number] Date of Birth/Sex: [birth year] (33 y.o. Female) Treating RN: Huel Coventry Primary Care Physician: [name] Other Clinician: Referring Physician: [name] Treating Physician/Extender: Rudene Re in Treatment: 10 Wound Status Wound Number: 8 Primary Diabetic Wound/Ulcer of the Lower Etiology: Extremity Wound Location: Left Toe Second Wound Status: Open Wounding Event: Gradually Appeared Date Acquired: 06/05/2015 Weeks Of Treatment: 0 Clustered Wound: No Photos Photo Uploaded By: Elliot Gurney, RN, [name], Kim on 06/07/2015 17:01:30 Wound Measurements Length: (cm) Width: (cm) Depth: (cm) Area: (cm) Volume: (cm) 2.2 % Reduction in Area: 2.5 % Reduction in Volume: 0.1 4.32 0.432 Periwound Skin Texture Texture Color No Abnormalities Noted: No No Abnormalities Noted: No Moisture No Abnormalities Noted: No Treatment Notes Wound #8 (Left Toe Second) 4. Dressing Applied: Other dressing (specify in notes) 5. Secondary Dressing Applied MONSERATH, NEFF ([id number]) Gauze and Kerlix/Conform Notes betadine paint Electronic Signature(s) Signed: 06/07/2015 5:06:19 PM By: Elliot Gurney, RN, [name], Kim RN, [name] Entered By: Elliot Gurney, RN, [name], Kim on 06/07/2015 15:46:27 [name] ([id number]) -------------------------------------------------------------------------------- Vitals Details Patient Name: [name] Date of Service: 06/07/2015 3:15 PM Medical Record Number: [id number] Patient Account Number: [id number] Date of Birth/Sex: 1981-08-08 (33 y.o. Female) Treating RN: Primary Care Physician: [name] Other  Clinician: Referring Physician: [name] Treating Physician/Extender: Rudene Re in Treatment: 10 Vital Signs Time Taken: 15:12 Temperature (F): 98.2 Height (in): 68 Pulse (bpm): 84 Weight (lbs): 122 Respiratory Rate (breaths/min): 18 Body Mass Index (BMI): 18.5 Blood Pressure (mmHg): 133/83 Capillary Blood Glucose (mg/dl): 338 Reference Range: 80 - 120 mg / dl Electronic Signature(s) Signed: 06/07/2015 5:06:19 PM By: Elliot Gurney, RN, [name], Kim RN, [name] Previous Signature: 06/07/2015 3:24:17 PM Version By: Dayton Martes RCP, RRT, CHT Entered By: Elliot Gurney, RN, [name], Kim on 06/07/2015 15:30:03

## 2015-06-08 NOTE — Progress Notes (Signed)
[name], Wu ([id number]) Visit Report for 06/07/2015 Chief Complaint Document Details Patient Name: [name], [name] Date of Service: 06/07/2015 3:15 PM Medical Record Number: [id number] Patient Account Number: [id number] Date of Birth/Sex: [birth year] (33 y.o. Female) Treating RN: Primary Care Physician: [name] Other Clinician: Referring Physician: [name] Treating Physician/Extender: Rudene Re in Treatment: 10 Information Obtained from: Patient Chief Complaint The patient is back today with 3 weeks treatment at [facility] where she is had several procedures done and was found to ultimately have a dry gangrene of the left forefoot. Electronic Signature(s) Signed: 06/07/2015 4:12:19 PM By: [name] MD, FACS Entered By: [name] on 06/07/2015 16:12:18 [name] ([id number]) -------------------------------------------------------------------------------- Debridement Details Patient Name: [name] Date of Service: 06/07/2015 3:15 PM Medical Record Number: [id number] Patient Account Number: [id number] Date of Birth/Sex: December 10, 1980 (33 y.o. Female) Treating RN: Primary Care Physician: [name] Other Clinician: Referring Physician: [name] Treating Physician/Extender: Rudene Re in Treatment: 10 Debridement Performed for Wound #1 Right,Distal Calcaneous Assessment: Performed By: Physician Tristan Schroeder., MD Debridement: Debridement Pre-procedure Yes Verification/Time Out Taken: Start Time: 03:50 Pain Control: Other : lidocaine 4% Level: Skin/Subcutaneous Tissue Total Area Debrided (L x 0.9 (cm) x 0.8 (cm) = 0.72 (cm) W): Tissue and other Viable, Non-Viable, Fibrin/Slough, Subcutaneous material debrided: Instrument: Curette Bleeding: Minimum Hemostasis Achieved: Pressure End Time: 03:53 Procedural Pain: 0 Post Procedural Pain: 0 Response to Treatment: Procedure was tolerated well Post  Debridement Measurements of Total Wound Length: (cm) 0.9 Width: (cm) 0.8 Depth: (cm) 0.1 Volume: (cm) 0.057 Post Procedure Diagnosis Same as Pre-procedure Electronic Signature(s) Signed: 06/07/2015 4:12:12 PM By: [name] MD, FACS Entered By: [name] on 06/07/2015 16:12:11 [name] ([id number]) -------------------------------------------------------------------------------- HPI Details Patient Name: [name] Date of Service: 06/07/2015 3:15 PM Medical Record Number: [id number] Patient Account Number: [id number] Date of Birth/Sex: [birth year] (33 y.o. Female) Treating RN: Primary Care Physician: [name] Other Clinician: Referring Physician: [name] Treating Physician/Extender: Rudene Re in Treatment: 10 History of Present Illness Location: right heel ulcer Quality: Patient reports No Pain. Severity: Patient states wound are getting worse. Duration: Patient has had the wound for > 2 months prior to seeking treatment at the wound center Context: The wound occurred when the patient injured her foot on something sharp on her deck. Modifying Factors: Consults to this date include:antibiotics which included Keflex in about a month ago and now at present time she is on clindamycin. Associated Signs and Symptoms: Patient reports having difficulty standing for long periods. HPI Description: [name] is a 34 y.o. female who presents to our wound center referred by her PCP Dr. Zada Finders for nonhealing ulcers on the lateral aspect of the right heel. The patient reports about 10 weeks ago she suffered an injury of the right heel. The wounds got infected, she was given Cephalexin and the use of Bactroban. Of note she has a history of type 1 diabetes mellitus that has been uncontrolled. Past medical history significant for type 1 diabetes mellitus not controlled, ankylosing spondylitis, anorexia nervosa, irritable bowel syndrome,  chronic kidney disease, chronic diarrhea. She has been treated by her PCP with duoderm which is to be changed every 3 days. Her last hemoglobin A1c in March was 11. 05/08/2015 -- the patient was seen here once on 03/28/2015 and then once in 04/17/2015. He has been very irregular with her appointments for various reasons. She says about 2 weeks ago she was in a swimming pool and she  injured her left big toe and had a blister which she started picking at. She says she's been applying some allergen over this. She's noticed that her toes on this foot and the skin over the dorsum of forefoot have turned red. She does not have any fever or any other significant signs of infection. Addendum: after seeing the patient I have personally spoken to her PCP Dr. [name] and discussed the recent cellulitis she's developed and recommended that she get IV antibiotics and inpatient care for a few days. 05/30/2015 -- the patient is here today with her mother and after her last visit on 8/10 she was seen in the ER at [facility] and they started her on oral antibiotics and got worked up with vascular surgery. 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 She was asked to follow- up with the wound center today for starting of hyperbaric oxygen therapy as soon as possible. 06/07/2015 -- after the initial treatment of hyperbaric oxygen therapy she started having ear problems and had ultimately to use myringotomy tubes and this was done today. Since then she's been doing fine. Electronic Signature(s) Signed: 06/07/2015 4:12:47 PM By: [name] MD, FACS Entered By: [name] on 06/07/2015 16:12:47 [name] ([id number]) -------------------------------------------------------------------------------- Physical Exam Details Patient Name: [name] Date of Service: 06/07/2015 3:15 PM Medical Record Number: [id number] Patient Account Number: [id number] Date of Birth/Sex: Feb 02, 1981 (33 y.o. Female) Treating RN: Primary Care Physician: [name] Other Clinician: Referring Physician: [name] Treating Physician/Extender: Rudene Re in Treatment: 10 Constitutional . Pulse regular. Respirations normal and unlabored. Afebrile. . Eyes Nonicteric. Reactive to light. Ears, Nose, Mouth, and Throat Lips, teeth, and gums WNL.Marland Kitchen Moist mucosa without lesions . Neck supple and nontender. No palpable supraclavicular or cervical adenopathy. Normal sized without goiter. Respiratory WNL. No retractions.. Cardiovascular weakly palpable pedal pulses. No clubbing, cyanosis or edema. Lymphatic No adneopathy. No adenopathy. No adenopathy. Musculoskeletal Adexa without tenderness or enlargement.. Digits and nails w/o clubbing, cyanosis, infection, petechiae, ischemia, or inflammatory conditions.. Integumentary (Hair, Skin) No suspicious lesions. No crepitus or fluctuance. No peri-wound warmth or erythema. No masses.Marland Kitchen Psychiatric Judgement and insight Intact.. No evidence of depression, anxiety, or agitation.. Notes the right calcaneus in wound has a lot of surrounding slough and callus and this was sharply debrided with a curette. The dry gangrene on the left first and second toe and also on the tip of her finger on the left hand continues as before Electronic Signature(s) Signed: 06/07/2015 4:13:36 PM By: [name] MD, FACS Entered By: [name] on 06/07/2015 16:13:35 [name] ([id number]) -------------------------------------------------------------------------------- Physician Orders Details Patient Name: [name] Date of Service: 06/07/2015 3:15 PM Medical Record Number: [id number] Patient Account Number: [id number] Date of Birth/Sex: 20-Dec-1980 (33 y.o. Female) Treating RN: Huel Coventry Primary Care Physician: [name] Other Clinician: Referring Physician: [name] Treating Physician/Extender: Rudene Re in Treatment: 10 Verbal / Phone Orders: Yes Clinician: Huel Coventry Read Back and Verified: Yes Diagnosis Coding Wound Cleansing Wound #1 Right,Distal Calcaneous o Cleanse wound with mild soap and water o May Shower, gently pat wound dry prior to applying new dressing. Wound #3 Left,Plantar Toe Great o Cleanse wound with mild soap and water o May Shower, gently pat wound dry prior to applying new dressing. Wound #4 Left,Dorsal Foot o Cleanse  wound with mild soap and water o May Shower, gently pat wound dry prior to applying new dressing. Wound #5 Right,Lateral Toe Great o Cleanse wound with mild soap and water o May Shower, gently pat wound dry prior to applying new dressing. Wound #6 Left,Lateral Foot o Cleanse wound with mild soap and water o May Shower, gently pat wound dry prior to applying new dressing. Wound #7 Left,Plantar Foot o Cleanse wound with mild soap and water o May Shower, gently pat wound dry prior to applying new dressing. Wound #8 Left Toe Second o Cleanse wound with mild soap and water o May Shower, gently pat wound dry prior to applying new dressing. Anesthetic Wound #1 Right,Distal Calcaneous o Topical Lidocaine 4% cream applied to wound bed prior to debridement Wound #3 Left,Plantar Toe Great o Topical Lidocaine 4% cream applied to wound bed prior to debridement Wound #4 Left,Dorsal Foot o Topical Lidocaine 4% cream applied to wound bed prior to debridement Binsfeld, [name] N. ([id number]) Wound #5 Right,Lateral Toe Great o Topical Lidocaine 4% cream applied to wound bed prior to debridement Wound #6 Left,Lateral Foot o Topical Lidocaine 4% cream applied to wound bed prior to debridement Wound #7 Left,Plantar Foot o Topical Lidocaine 4% cream applied to wound bed prior  to debridement Wound #8 Left Toe Second o Topical Lidocaine 4% cream applied to wound bed prior to debridement Primary Wound Dressing Wound #1 Right,Distal Calcaneous o Aquacel Ag Wound #3 Left,Plantar Toe Great o Other: - betadine paint Wound #4 Left,Dorsal Foot o Aquacel Ag Wound #5 Right,Lateral Toe Great o Aquacel Ag Wound #6 Left,Lateral Foot o Aquacel Ag Wound #7 Left,Plantar Foot o Aquacel Ag Secondary Dressing Wound #1 Right,Distal Calcaneous o Boardered Foam Dressing Wound #3 Left,Plantar Toe Great o Conform/Kerlix Wound #4 Left,Dorsal Foot o Boardered Foam Dressing Wound #5 Right,Lateral Toe Great o Boardered Foam Dressing Wound #6 Left,Lateral Foot o Boardered Foam Dressing Wound #7 Left,Plantar Foot Mussell, Shikara N. ([id number]) o Boardered Foam Dressing Dressing Change Frequency Wound #1 Right,Distal Calcaneous o Change dressing every other day. Wound #3 Left,Plantar Toe Great o Change dressing every other day. Wound #4 Left,Dorsal Foot o Change dressing every other day. Wound #5 Right,Lateral Toe Great o Change dressing every other day. Wound #6 Left,Lateral Foot o Change dressing every other day. Wound #7 Left,Plantar Foot o Change dressing every other day. Wound #8 Left Toe Second o Change dressing every other day. Follow-up Appointments Wound #1 Right,Distal Calcaneous o Return Appointment in 1 week. Wound #3 Left,Plantar Toe Great o Return Appointment in 1 week. Wound #4 Left,Dorsal Foot o Return Appointment in 1 week. Wound #5 Right,Lateral Toe Great o Return Appointment in 1 week. Wound #6 Left,Lateral Foot o Return Appointment in 1 week. Wound #7 Left,Plantar Foot o Return Appointment in 1 week. Wound #8 Left Toe Second o Return Appointment in 1 week. Off-Loading Wound #1 Right,Distal Calcaneous Gaines, Lillah N. ([id number]) o Other: - darco with peg assist Wound #3  Left,Plantar Toe Great o Other: - darco with peg assist Wound #4 Left,Dorsal Foot o Other: - darco with peg assist Wound #5 Right,Lateral Toe Great o Other: - darco with peg assist Wound #6 Left,Lateral Foot o Other: - darco with peg assist Wound #7 Left,Plantar Foot o Other: - darco with peg assist Wound #8 Left Toe Second o Other: - darco with peg assist Electronic Signature(s) Signed: 06/07/2015 4:37:26 PM By: [name] MD, FACS Signed: 06/07/2015 5:06:19 PM By: Elliot Gurney, RN, [name], Kim RN, [name] Entered  By: Elliot Gurney, RN, [name], Kim on 06/07/2015 16:11:27 [name] ([id number]) -------------------------------------------------------------------------------- Problem List Details Patient Name: [name] Date of Service: 06/07/2015 3:15 PM Medical Record Number: [id number] Patient Account Number: [id number] Date of Birth/Sex: Oct 09, 1980 (33 y.o. 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 L97.429 Non-pressure chronic ulcer of left heel and midfoot with 05/09/2015 Yes unspecified severity L03.116 Cellulitis of left lower limb 05/09/2015 Yes I70.245 Atherosclerosis of native arteries of left leg with ulceration 05/30/2015 Yes of other part of foot E10.52 Type 1 diabetes mellitus with diabetic peripheral 05/30/2015 Yes angiopathy with gangrene Inactive Problems Resolved Problems Electronic Signature(s) Signed: 06/07/2015 4:12:02 PM By: [name] MD, FACS Entered By: [name] on 06/07/2015 16:12:01 [name]  ([id number]) -------------------------------------------------------------------------------- Progress Note Details Patient Name: [name] Date of Service: 06/07/2015 3:15 PM Medical Record Number: [id number] Patient Account Number: [id number] Date of Birth/Sex: 1981-02-01 (33 y.o. Female) Treating RN: Primary Care Physician: [name] Other Clinician: Referring Physician: [name] Treating Physician/Extender: Rudene Re in Treatment: 10 Subjective Chief Complaint Information obtained from Patient The patient is back today with 3 weeks treatment at [facility] where she is had several procedures done and was found to ultimately have a dry gangrene of the left forefoot. History of Present Illness (HPI) The following HPI elements were documented for the patient's wound: Location: right heel ulcer Quality: Patient reports No Pain. Severity: Patient states wound are getting worse. Duration: Patient has had the wound for > 2 months prior to seeking treatment at the wound center Context: The wound occurred when the patient injured her foot on something sharp on her deck. Modifying Factors: Consults to this date include:antibiotics which included Keflex in about a month ago and now at present time she is on clindamycin. Associated Signs and Symptoms: Patient reports having difficulty standing for long periods. Sagan Wurzel is a 34 y.o. female who presents to our wound center referred by her PCP Dr. Zada Finders for nonhealing ulcers on the lateral aspect of the right heel. The patient reports about 10 weeks ago she suffered an injury of the right heel. The wounds got infected, she was given Cephalexin and the use of Bactroban. Of note she has a history of type 1 diabetes mellitus that has been uncontrolled. Past medical history significant for type 1 diabetes mellitus not controlled, ankylosing spondylitis, anorexia nervosa, irritable bowel syndrome, chronic kidney  disease, chronic diarrhea. She has been treated by her PCP with duoderm which is to be changed every 3 days. Her last hemoglobin A1c in March was 11. 05/08/2015 -- the patient was seen here once on 03/28/2015 and then once in 04/17/2015. He has been very irregular with her appointments for various reasons. She says about 2 weeks ago she was in a swimming pool and she injured her left big toe and had a blister which she started picking at. She says she's been applying some allergen over this. She's noticed that her toes on this foot and the skin over the dorsum of forefoot have turned red. She does not have any fever or any other significant signs of infection. Addendum: after seeing the patient I have personally spoken to her PCP Dr. [name] and discussed  the recent cellulitis she's developed and recommended that she get IV antibiotics and inpatient care for a few days. 05/30/2015 -- the patient is here today with her mother and after her last visit on 8/10 she was seen in the ER at [facility] and they started her on oral antibiotics and got worked up with vascular surgery. 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 She was asked to follow- up with the wound center today for starting of hyperbaric oxygen therapy as soon as possible. CHRISANDRA, WIEMERS ([id number]) 06/07/2015 -- after the initial treatment of hyperbaric oxygen therapy she started having ear problems and had ultimately to use myringotomy tubes and this was done today. Since then she's been doing fine. Objective Constitutional Pulse regular. Respirations normal and unlabored. Afebrile. Vitals Time Taken: 3:12 PM, Height: 68 in, Weight: 122 lbs, BMI: 18.5, Temperature: 98.2 F, Pulse: 84 bpm, Respiratory Rate: 18 breaths/min, Blood Pressure: 133/83 mmHg, Capillary Blood Glucose:  475 mg/dl. Eyes Nonicteric. Reactive to light. Ears, Nose, Mouth, and Throat Lips, teeth, and gums WNL.Marland Kitchen Moist mucosa without lesions . Neck supple and nontender. No palpable supraclavicular or cervical adenopathy. Normal sized without goiter. Respiratory WNL. No retractions.. Cardiovascular weakly palpable pedal pulses. No clubbing, cyanosis or edema. Lymphatic No adneopathy. No adenopathy. No adenopathy. Musculoskeletal Adexa without tenderness or enlargement.. Digits and nails w/o clubbing, cyanosis, infection, petechiae, ischemia, or inflammatory conditions.Marland Kitchen Psychiatric Judgement and insight Intact.. No evidence of depression, anxiety, or agitation.. General Notes: the right calcaneus in wound has a lot of surrounding slough and callus and this was sharply debrided with a curette. The dry gangrene on the left first and second toe and also on the tip of her finger on the left hand continues as before Integumentary (Hair, Skin) No suspicious lesions. No crepitus or fluctuance. No peri-wound warmth or erythema. No masses.Marland Kitchen MIDA, CORY ([id number]) Wound #1 status is Open. Original cause of wound was Gradually Appeared. The wound is located on the Right,Distal Calcaneous. The wound measures 0.9cm length x 0.8cm width x 0.1cm depth; 0.565cm^2 area and 0.057cm^3 volume. Wound #2 status is Healed - Epithelialized. Original cause of wound was Gradually Appeared. The wound is located on the Right,Proximal Calcaneous. The wound measures 0cm length x 0cm width x 0cm depth; 0cm^2 area and 0cm^3 volume. Wound #3 status is Open. Original cause of wound was Shear/Friction. The wound is located on the [employer]. The wound measures 7cm length x 7cm width x 0.1cm depth; 38.485cm^2 area and 3.848cm^3 volume. Wound #4 status is Open. Original cause of wound was Gradually Appeared. The wound is located on the Left,Dorsal Foot. The wound measures 0.4cm length x 0.2cm width x  0.1cm depth; 0.063cm^2 area and 0.006cm^3 volume. Wound #5 status is Open. Original cause of wound was Gradually Appeared. The wound is located on the [employer]. The wound measures 0.7cm length x 0.6cm width x 0.1cm depth; 0.33cm^2 area and 0.033cm^3 volume. Wound #6 status is Open. Original cause of wound was Gradually Appeared. The wound is located on the Left,Lateral Foot. The wound measures 1cm length x 1.5cm width x 0.1cm depth; 1.178cm^2 area and 0.118cm^3 volume. Wound #7 status is Open. Original cause of wound was Gradually Appeared. The wound is located on the Left,Plantar Foot. The wound measures 0.4cm length x 0.6cm width x 0.1cm depth; 0.188cm^2 area  and 0.019cm^3 volume. Wound #8 status is Open. Original cause of wound was Gradually Appeared. The wound is located on the Left Toe Second. The wound measures 2.2cm length x 2.5cm width x 0.1cm depth; 4.32cm^2 area and 0.432cm^3 volume. 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 She also has local care and good control of her diabetes and we will continue to monitor her closely. She will follow-up with the vascular surgeons as needed Procedures Wound #1 Wound #1 is a Diabetic Wound/Ulcer of the Lower Extremity located on the Right,Distal Calcaneous . There was a Skin/Subcutaneous Tissue Debridement  [id number]) debridement with total area of 0.72 sq cm performed by Momen Ham, Ignacia Felling., MD. with the following instrument(s): Curette to remove Viable and Non-Viable tissue/material including Fibrin/Slough and Subcutaneous after achieving pain control using Other (lidocaine 4%). A time out was conducted prior to the start of the procedure. A Minimum amount of bleeding was controlled with Pressure. The procedure was tolerated well with a pain level of 0 throughout and a pain level of 0 following the procedure. Post Debridement Measurements: 0.9cm length x 0.8cm width x 0.1cm depth; 0.057cm^3 volume. Post procedure Diagnosis Wound #1: Same as Pre-Procedure Plan Wound Cleansing: Wound #1 Right,Distal Calcaneous: Cleanse wound with mild soap and water May Shower, gently pat wound dry prior to applying new dressing. Wound #3 Left,Plantar Toe Great: Cleanse wound with mild soap and water May Shower, gently pat wound dry prior to applying new dressing. Wound #4 Left,Dorsal Foot: Cleanse wound with mild soap and water May Shower, gently pat wound dry prior to applying new dressing. Wound #5 Right,Lateral Toe Great: Cleanse wound with mild soap and water May Shower, gently pat wound dry prior to applying new dressing. Wound #6 Left,Lateral Foot: Cleanse wound with mild soap and water May Shower, gently pat wound dry prior to applying new dressing. Wound #7 Left,Plantar Foot: Cleanse wound with mild soap and water May Shower, gently pat wound dry prior to applying new dressing. Wound #8 Left Toe SecondLEVA, BAINE. ([id number]) Cleanse wound with mild soap and water May Shower, gently pat wound dry prior to applying new dressing. Anesthetic: Wound #1 Right,Distal Calcaneous: Topical Lidocaine 4% cream applied to wound bed prior to debridement Wound #3 Left,Plantar Toe Great: Topical Lidocaine 4% cream applied to wound bed prior to debridement Wound #4 Left,Dorsal Foot: Topical  Lidocaine 4% cream applied to wound bed prior to debridement Wound #5 Right,Lateral Toe Great: Topical Lidocaine 4% cream applied to wound bed prior to debridement Wound #6 Left,Lateral Foot: Topical Lidocaine 4% cream applied to wound bed prior to debridement Wound #7 Left,Plantar Foot: Topical Lidocaine 4% cream applied to wound bed prior to debridement Wound #8 Left Toe Second: Topical Lidocaine 4% cream applied to wound bed prior to debridement Primary Wound Dressing: Wound #1 Right,Distal Calcaneous: Aquacel Ag Wound #3 Left,Plantar Toe Great: Other: - betadine paint Wound #4 Left,Dorsal Foot: Aquacel  Ag Wound #5 Right,Lateral Toe Great: Aquacel Ag Wound #6 Left,Lateral Foot: Aquacel Ag Wound #7 Left,Plantar Foot: Aquacel Ag Secondary Dressing: Wound #1 Right,Distal Calcaneous: Boardered Foam Dressing Wound #3 Left,Plantar Toe Great: Conform/Kerlix Wound #4 Left,Dorsal Foot: Boardered Foam Dressing Wound #5 Right,Lateral Toe Great: Boardered Foam Dressing Wound #6 Left,Lateral Foot: Boardered Foam Dressing Wound #7 Left,Plantar Foot: Boardered Foam Dressing Dressing Change Frequency: Wound #1 Right,Distal Calcaneous: Change dressing every other day. Wound #3 Left,Plantar Toe Great: Change dressing every other day. Wound #4 Left,Dorsal Foot: Change dressing every other day. Wound #5 Right,Lateral Toe GreatKENIAH, KLEMMER. ([id number]) Change dressing every other day. Wound #6 Left,Lateral Foot: Change dressing every other day. Wound #7 Left,Plantar Foot: Change dressing every other day. Wound #8 Left Toe Second: Change dressing every other day. Follow-up Appointments: Wound #1 Right,Distal Calcaneous: Return Appointment in 1 week. Wound #3 Left,Plantar Toe Great: Return Appointment in 1 week. Wound #4 Left,Dorsal Foot: Return Appointment in 1 week. Wound #5 Right,Lateral Toe Great: Return Appointment in 1 week. Wound #6 Left,Lateral Foot: Return  Appointment in 1 week. Wound #7 Left,Plantar Foot: Return Appointment in 1 week. Wound #8 Left Toe Second: Return Appointment in 1 week. 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 She also has local care and good control of her diabetes and we will continue to monitor her closely. She will follow-up with the vascular surgeons as needed ALAIJAH, GIBLER ([id number]) Electronic Signature(s) Signed: 06/07/2015 4:14:28 PM By: [name] MD, FACS Entered By: [name] on 06/07/2015 16:14:28 [name] ([id number]) -------------------------------------------------------------------------------- SuperBill Details Patient Name: [name] Date of Service: 06/07/2015 Medical Record Number: [id number] Patient Account Number: [id number] Date of Birth/Sex: December 04, 1980 (34 y.o. 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 Facility Procedures CPT4: Description Modifier Quantity Code [id number] 11042 - DEB SUBQ TISSUE 20 SQ CM/< 1 ICD-10 Description Diagnosis E10.621 Type 1 diabetes mellitus with foot ulcer 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

## 2015-06-08 NOTE — Progress Notes (Signed)
LAKEYTA, VANDENHEUVEL ([id number]) Visit Report for 06/07/2015 HBO Details Patient Name: [name], [name] Date of Service: 06/07/2015 1:00 PM Medical Record Number: [id number] Patient Account Number: [id number] Date of Birth/Sex: Dec 01, 1980 (34 y.o. Female) Treating RN: Primary Care Physician: [name] Other Clinician: Referring Physician: [name] Treating Physician/Extender: Rudene Re in Treatment: 10 HBO Treatment Course Details Treatment Course Ordering Physician: [name] 1 Number: HBO Treatment Start Date: 06/04/2015 Total Treatments 40 Ordered: HBO Indication: Other (specify in Notes) HBO Treatment Details Treatment Number: 2 Patient Type: Outpatient Chamber Type: Monoplace Chamber #: HBO #[id number] Treatment Protocol: 2.0 ATA with 90 minutes oxygen, and no air breaks Treatment Details Compression Rate Down: 1.5 psi / minute De-Compression Rate Up: 1.5 psi / minute Air breaks and breathing Compress Tx Pressure Decompress Decompress periods Begins Reached Begins Ends (leave unused spaces blank) Chamber Pressure 1 ATA 2.0 ATA - - - - - - 2.0 ATA 1 ATA Clock Time (24 hr) 13:05 13:15 - - - - - - 14:45 14:56 Treatment Length: 111 (minutes) Treatment Segments: 4 Capillary Blood Glucose Pre Capillary Blood Glucose (mg/dl): Post Capillary Blood Glucose (mg/dl): Vital Signs Capillary Blood Glucose Reference Range: 80 - 120 mg / dl HBO Diabetic Blood Glucose Intervention Range: <131 mg/dl or >884 mg/dl Time Vitals Blood Respiratory Capillary Blood Glucose Pulse Action Type: Pulse: Temperature: Taken: Pressure: Rate: Glucose (mg/dl): Meter #: Oximetry (%) Taken: Pre 12:50 122/66 89 18 98.3 453 Post 15:12 133/83 84 18 98.2 475 1 none Treatment Response Treatment Completion Status: Treatment Completed without Adverse Event MINAAL, STRUCKMAN ([id number]) Physician Notes Due to continued problems with her years she went to the ENT physician  this morning and had bilateral myringotomy tubes placed. HBO Attestation I certify that I supervised this HBO treatment in accordance with Medicare guidelines. A trained Yes emergency response team is readily available per hospital policies and procedures. Continue HBOT as ordered. Yes Electronic Signature(s) Signed: 06/07/2015 3:26:22 PM By: [name] MD, FACS Previous Signature: 06/07/2015 3:23:14 PM Version By: Dayton Martes RCP, RRT, CHT Entered By: [name] on 06/07/2015 15:26:21 [name] ([id number]) -------------------------------------------------------------------------------- HBO Safety Checklist Details Patient Name: [name] Date of Service: 06/07/2015 1:00 PM Medical Record Number: [id number] Patient Account Number: [id number] Date of Birth/Sex: 1981-07-27 (34 y.o. Female) Treating RN: Primary Care Physician: [name] Other Clinician: Referring Physician: [name] Treating Physician/Extender: Rudene Re in Treatment: 10 HBO Safety Checklist Items Safety Checklist Consent Form Signed Patient voided / foley secured and emptied When did you last eato 19:00 pm on 06/06/15 Last dose of injectable or oral agent 19:00 pm NA Ostomy pouch emptied and vented if applicable NA All implantable devices assessed, documented and approved Intravenous access site secured and place Valuables secured Linens and cotton and cotton/polyester blend (less than 51% polyester) Personal oil-based products / skin lotions / body lotions removed Wigs or hairpieces removed Smoking or tobacco materials removed Books / newspapers / magazines / loose paper removed Cologne, aftershave, perfume and deodorant removed Jewelry removed (may wrap wedding band) Make-up removed Hair care products removed Battery operated devices (external) removed Heating patches and chemical warmers removed NA Titanium eyewear removed Nail polish cured  greater than 10 hours NA Casting material cured greater than 10 hours NA Hearing aids removed NA Loose dentures or partials removed NA Prosthetics have been removed Patient demonstrates correct use of air break device (if applicable) Patient concerns have been addressed Patient grounding bracelet on and cord attached to  chamber Specifics for Inpatients (complete in addition to above) Medication sheet sent with patient Intravenous medications needed or due during therapy sent with patient LEEANDRA, ELLERSON ([id number]) Drainage tubes (e.g. nasogastric tube or chest tube secured and vented) Endotracheal or Tracheotomy tube secured Cuff deflated of air and inflated with saline Airway suctioned Electronic Signature(s) Signed: 06/07/2015 3:23:14 PM By: Dayton Martes RCP, RRT, CHT Entered By: Weyman Rodney, Lucio Edward on 06/07/2015 13:01:24

## 2015-06-10 ENCOUNTER — Encounter: Payer: Medicare Other | Admitting: Surgery

## 2015-06-10 DIAGNOSIS — L97429 Non-pressure chronic ulcer of left heel and midfoot with unspecified severity: Secondary | ICD-10-CM | POA: Diagnosis not present

## 2015-06-10 LAB — GLUCOSE, CAPILLARY
GLUCOSE-CAPILLARY: 203 mg/dL — AB (ref 65–99)
Glucose-Capillary: 258 mg/dL — ABNORMAL HIGH (ref 65–99)

## 2015-06-11 ENCOUNTER — Encounter: Payer: Medicare Other | Admitting: Surgery

## 2015-06-11 NOTE — Progress Notes (Signed)
CLARISE, CHACKO ([id number]) Visit Report for 06/10/2015 HBO Details Almendariz, Hawraa 06/10/2015 1:00 Patient Name: Date of Service: N. PM Medical Record Patient Account Number: [id number] Number: Treating RN: Date of Birth/Sex: April 25, 1981 (33 y.o. Female) Other Clinician: Izetta Dakin Primary Care Physician: [name] Treating [name] Referring Physician: [name] Physician/Extender: Tania Ade in Treatment: 10 HBO Treatment Course Details Treatment Course Ordering Physician: [name] 1 Number: HBO Treatment Start Date: 06/04/2015 Total Treatments 40 Ordered: HBO Indication: Other (specify in Notes) HBO Treatment Details Treatment Number: 3 Patient Type: Outpatient Chamber Type: Monoplace Chamber #: HBO #[id number] Treatment Protocol: 2.0 ATA with 90 minutes oxygen, and no air breaks Treatment Details Compression Rate Down: 1.5 psi / minute De-Compression Rate Up: 1.5 psi / minute Air breaks and breathing Compress Tx Pressure Decompress Decompress periods Begins Reached Begins Ends (leave unused spaces blank) Chamber Pressure 1 ATA 2.0 ATA - - - - - - 2.0 ATA 1 ATA Clock Time (24 hr) 12:57 13:07 - - - - - - 14:37 14:48 Treatment Length: 111 (minutes) Treatment Segments: 4 Capillary Blood Glucose Pre Capillary Blood Glucose (mg/dl): Post Capillary Blood Glucose (mg/dl): Vital Signs Capillary Blood Glucose Reference Range: 80 - 120 mg / dl HBO Diabetic Blood Glucose Intervention Range: <131 mg/dl or >539 mg/dl Time Vitals Blood Respiratory Capillary Blood Glucose Pulse Action Type: Pulse: Temperature: Taken: Pressure: Rate: Glucose (mg/dl): Meter #: Oximetry (%) Taken: Pre 12:45 143/96 86 18 98.3 203 1 none Post 14:58 151/100 82 18 97.7 258 1 none Treatment Response Gater, Mykell N. ([id number]) Treatment Completion Status: Treatment Completed without Adverse Event Physician Notes The patient had some blood dripping from  her right ear and spite of the fact that she has had myringotomy tubes placed. I have asked her to see her ENT surgeon today if possible. Electronic Signature(s) Signed: 06/10/2015 3:55:01 PM By: [name] MD, FACS Entered By: [name] on 06/10/2015 15:55:01 [name] ([id number]) -------------------------------------------------------------------------------- HBO Safety Checklist Details FLO, BERROA 06/10/2015 1:00 Patient Name: Date of Service: N. PM Medical Record Patient Account Number: [id number] Number: Treating RN: Date of Birth/Sex: 1980/10/18 (33 y.o. Female) Other Clinician: Izetta Dakin Primary Care Physician: [name] Treating [name] Referring Physician: [name] Physician/Extender: Tania Ade in Treatment: 10 HBO Safety Checklist Items Safety Checklist Consent Form Signed Patient voided / foley secured and emptied When did you last eato 19:30 pm Last dose of injectable or oral agent 19:30 pm NA Ostomy pouch emptied and vented if applicable NA All implantable devices assessed, documented and approved Intravenous access site secured and place Valuables secured Linens and cotton and cotton/polyester blend (less than 51% polyester) Personal oil-based products / skin lotions / body lotions removed Wigs or hairpieces removed Smoking or tobacco materials removed Books / newspapers / magazines / loose paper removed Cologne, aftershave, perfume and deodorant removed Jewelry removed (may wrap wedding band) Make-up removed Hair care products removed Battery operated devices (external) removed Heating patches and chemical warmers removed NA Titanium eyewear removed Nail polish cured greater than 10 hours NA Casting material cured greater than 10 hours NA Hearing aids removed NA Loose dentures or partials removed NA Prosthetics have been removed Patient demonstrates correct use of air break device (if  applicable) Patient concerns have been addressed Patient grounding bracelet on and cord attached to chamber Specifics for Inpatients (complete in addition to above) Medication sheet sent with patient BRESLIN, BURKLOW. ([id number]) Intravenous medications needed or due during therapy sent with patient Drainage tubes (  e.g. nasogastric tube or chest tube secured and vented) Endotracheal or Tracheotomy tube secured Cuff deflated of air and inflated with saline Airway suctioned Electronic Signature(s) Signed: 06/11/2015 3:56:45 PM By: Dayton Martes RCP, RRT, CHT Entered By: Weyman Rodney, Lucio Edward on 06/10/2015 13:04:18

## 2015-06-11 NOTE — Progress Notes (Signed)
[name], [name] ([id number]) Visit Report for 06/10/2015 Arrival Information Details [name], [name] 06/10/2015 1:00 Patient Name: Date of Service: [name] Medical Record Patient Account Number: [id number] Number: Treating RN: Date of Birth/Sex: 03-23-1981 (33 y.o. Female) Other Clinician: Izetta Dakin Primary Care Physician: [name] Treating [name] Referring Physician: [name] Physician/Extender: Tania Ade in Treatment: 10 Visit Information History Since Last Visit Added or deleted any medications: No Patient Arrived: Ambulatory Any new allergies or adverse reactions: No Arrival Time: 12:40 Had a fall or experienced change in No Accompanied By: aunt activities of daily living that may affect Transfer Assistance: None risk of falls: Patient Identification Verified: Yes Signs or symptoms of abuse/neglect No Secondary Verification Yes since last visito Process Completed: Hospitalized since last visit: No Patient Requires No Has Dressing in Place as Prescribed: Yes Transmission-Based Has Footwear/Offloading in Place as Yes Precautions: Prescribed: Patient Has Alerts: Yes Left: Surgical Shoe with Patient Alerts: ABI..[place] bilateral Pressure Relief >246mmH Insole Right: Surgical Shoe with Pressure Relief Insole Pain Present Now: No Electronic Signature(s) Signed: 06/11/2015 3:56:45 Wu By: Dayton Martes RCP, RRT, CHT Entered By: Dayton Martes on 06/10/2015 13:04:56 [name] ([id number]) -------------------------------------------------------------------------------- Encounter Discharge Information Details [name], [name] 06/10/2015 1:00 Patient Name: Date of Service: [name] Medical Record Patient Account Number: [id number] Number: Treating RN: Date of Birth/Sex: 05-Dec-1980 (33 y.o. Female) Other Clinician: Izetta Dakin Primary Care Physician: [name] Treating [name] Referring Physician: [name] Physician/Extender: Tania Ade in Treatment: 10 Encounter Discharge Information Items Discharge Pain Level: 0 Discharge Condition: Stable Ambulatory Status: Ambulatory Discharge Destination: Home Private Transportation: Auto Accompanied By: Farrel Gobble Schedule Follow-up Appointment: No Medication Reconciliation completed and No provided to Patient/Care [name]: Clinical Summary of Care: Notes Patient has an HBO treatment scheduled on 06/11/15 at 13:00 [name] Electronic Signature(s) Signed: 06/11/2015 3:56:45 Wu By: Dayton Martes RCP, RRT, CHT Entered By: Dayton Martes on 06/10/2015 15:21:22 [name] ([id number]) -------------------------------------------------------------------------------- Vitals Details [name], [name] 06/10/2015 1:00 Patient Name: Date of Service: [name] Medical Record Patient Account Number: [id number] Number: Treating RN: Date of Birth/Sex: 20-Dec-1980 (33 y.o. Female) Other Clinician: Izetta Dakin Primary Care Physician: [name] Treating [name] Referring Physician: [name] Physician/Extender: Tania Ade in Treatment: 10 Vital Signs Time Taken: 12:45 Temperature (F): 98.3 Height (in): 68 Pulse (bpm): 86 Weight (lbs): 122 Respiratory Rate (breaths/min): 18 Body Mass Index (BMI): 18.5 Blood Pressure (mmHg): 143/96 Capillary Blood Glucose (mg/dl): 154 Reference Range: 80 - 120 mg / dl Electronic Signature(s) Signed: 06/11/2015 3:56:45 Wu By: Dayton Martes RCP, RRT, CHT Entered By: Dayton Martes on 06/10/2015 13:03:15

## 2015-06-12 ENCOUNTER — Encounter: Payer: Medicare Other | Admitting: Surgery

## 2015-06-12 DIAGNOSIS — L97429 Non-pressure chronic ulcer of left heel and midfoot with unspecified severity: Secondary | ICD-10-CM | POA: Diagnosis not present

## 2015-06-12 LAB — GLUCOSE, CAPILLARY
GLUCOSE-CAPILLARY: 271 mg/dL — AB (ref 65–99)
GLUCOSE-CAPILLARY: 351 mg/dL — AB (ref 65–99)

## 2015-06-12 NOTE — Progress Notes (Signed)
MARIANE, BURPEE ([id number]) Visit Report for 06/12/2015 Arrival Information Details GUSTAVIA, CARIE Date of Service: 06/12/2015 1:00 PM Patient Name: N. Patient Account Number: [id number] Medical Record Treating RN: [id number] Number: Other Clinician: Izetta Dakin January 12, 1981 (34 y.o. Treating BURNS III, Date of Birth/Sex: Female) Physician/Extender: Zollie Beckers Primary Care [name] Physician: Referring Physician: [name] in Treatment: 10 Visit Information History Since Last Visit Added or deleted any medications: No Patient Arrived: Ambulatory Any new allergies or adverse reactions: No Arrival Time: 13:10 Had a fall or experienced change in No Accompanied By: mother activities of daily living that may affect Transfer Assistance: None risk of falls: Patient Identification Verified: Yes Signs or symptoms of abuse/neglect No Secondary Verification Yes since last visito Process Completed: Hospitalized since last visit: No Patient Requires No Has Dressing in Place as Prescribed: Yes Transmission-Based Has Footwear/Offloading in Place as Yes Precautions: Prescribed: Patient Has Alerts: Yes Left: Surgical Shoe with Patient Alerts: ABI..[place] bilateral Pressure Relief >246mmH Insole Right: Surgical Shoe with Pressure Relief Insole Pain Present Now: No Electronic Signature(s) Signed: 06/12/2015 3:52:54 PM By: Dayton Martes RCP, RRT, CHT Entered By: Dayton Martes on 06/12/2015 13:16:00 [name] ([id number]) -------------------------------------------------------------------------------- Encounter Discharge Information Details [name] Date of Service: 06/12/2015 1:00 PM Patient Name: N. Patient Account Number: [id number] Medical Record Treating RN: [id number] Number: Other Clinician: Izetta Dakin 09-16-1981 (34 y.o. Treating BURNS III, Date of Birth/Sex: Female) Physician/Extender:  Zollie Beckers Primary Care [name] Physician: Referring Physician: [name] in Treatment: 10 Encounter Discharge Information Items Discharge Pain Level: 0 Discharge Condition: Stable Ambulatory Status: Ambulatory Discharge Destination: Home Private Transportation: Auto Accompanied By: mother Schedule Follow-up Appointment: No Medication Reconciliation completed and No provided to Patient/Care Timber Marshman: Clinical Summary of Care: Notes Patient has an HBO treatment scheduled on 06/13/15 at 13:00 pm. Electronic Signature(s) Signed: 06/12/2015 3:52:54 PM By: Dayton Martes RCP, RRT, CHT Entered By: Dayton Martes on 06/12/2015 15:52:32 [name] ([id number]) -------------------------------------------------------------------------------- Vitals Details [name] Date of Service: 06/12/2015 1:00 PM Patient Name: N. Patient Account Number: [id number] Medical Record Treating RN: [id number] Number: Other Clinician: Izetta Dakin [birth year] (34 y.o. Treating BURNS III, Date of Birth/Sex: Female) Physician/Extender: Zollie Beckers Primary Care [name] Physician: Referring Physician: [name] in Treatment: 10 Vital Signs Time Taken: 13:11 Temperature (F): 98.2 Height (in): 68 Pulse (bpm): 88 Weight (lbs): 122 Respiratory Rate (breaths/min): 18 Body Mass Index (BMI): 18.5 Blood Pressure (mmHg): 139/98 Capillary Blood Glucose (mg/dl): 818 Reference Range: 80 - 120 mg / dl Electronic Signature(s) Signed: 06/12/2015 3:52:54 PM By: Dayton Martes RCP, RRT, CHT Entered By: Weyman Rodney, Lucio Edward on 06/12/2015 13:16:29

## 2015-06-13 ENCOUNTER — Encounter: Payer: Medicare Other | Admitting: Surgery

## 2015-06-13 DIAGNOSIS — L97429 Non-pressure chronic ulcer of left heel and midfoot with unspecified severity: Secondary | ICD-10-CM | POA: Diagnosis not present

## 2015-06-13 LAB — GLUCOSE, CAPILLARY
Glucose-Capillary: 297 mg/dL — ABNORMAL HIGH (ref 65–99)
Glucose-Capillary: 155 mg/dL — ABNORMAL HIGH (ref 65–99)

## 2015-06-13 NOTE — Progress Notes (Signed)
[name], [name] ([id number]) Visit Report for 06/13/2015 HBO Details [name], [name] 06/13/2015 1:00 Patient Name: Date of Service: N. PM Medical Record Patient Account Number: [id number] Number: Treating RN: Date of Birth/Sex: 05/06/1981 (33 y.o. Female) Other Clinician: Izetta Dakin Primary Care Physician: [name] Treating [name] Referring Physician: [name] Physician/Extender: Tania Ade in Treatment: 11 HBO Treatment Course Details Treatment Course Ordering Physician: [name] 1 Number: HBO Treatment Start Date: 06/04/2015 Total Treatments 40 Ordered: HBO Indication: Other (specify in Notes) HBO Treatment Details Treatment Number: 5 Patient Type: Outpatient Chamber Type: Monoplace Chamber #: HBO #[id number] Treatment Protocol: 2.0 ATA with 90 minutes oxygen, and no air breaks Treatment Details Compression Rate Down: 1.5 psi / minute De-Compression Rate Up: 1.5 psi / minute Air breaks and breathing Compress Tx Pressure Decompress Decompress periods Begins Reached Begins Ends (leave unused spaces blank) Chamber Pressure 1 ATA 2.0 ATA - - - - - - 2.0 ATA 1 ATA Clock Time (24 hr) 12:55 13:05 - - - - - - 14:35 14:46 Treatment Length: 111 (minutes) Treatment Segments: 4 Capillary Blood Glucose Pre Capillary Blood Glucose (mg/dl): Post Capillary Blood Glucose (mg/dl): Vital Signs Capillary Blood Glucose Reference Range: 80 - 120 mg / dl HBO Diabetic Blood Glucose Intervention Range: <131 mg/dl or >147 mg/dl Time Vitals Blood Respiratory Capillary Blood Glucose Pulse Action Type: Pulse: Temperature: Taken: Pressure: Rate: Glucose (mg/dl): Meter #: Oximetry (%) Taken: Pre 12:44 116/54 77 18 97.9 297 1 none Post 14:49 136/93 86 18 97.7 155 1 none Treatment Response [name], [name] ([id number]) Treatment Completion Status: Treatment Completed without Adverse Event HBO Attestation I certify that I supervised this HBO  treatment in accordance with Medicare guidelines. A trained Yes emergency response team is readily available per hospital policies and procedures. Continue HBOT as ordered. Yes Electronic Signature(s) Signed: 06/13/2015 3:40:28 PM By: [name] MD, FACS Previous Signature: 06/13/2015 3:24:36 PM Version By: Dayton Martes RCP, RRT, CHT Entered By: [name] on 06/13/2015 15:40:28 [name] ([id number]) -------------------------------------------------------------------------------- HBO Safety Checklist Details [name], [name] 06/13/2015 1:00 Patient Name: Date of Service: N. PM Medical Record Patient Account Number: [id number] Number: Treating RN: Date of Birth/Sex: 20-Jan-1981 (33 y.o. Female) Other Clinician: Izetta Dakin Primary Care Physician: [name] Treating [name] Referring Physician: [name] Physician/Extender: Tania Ade in Treatment: 11 HBO Safety Checklist Items Safety Checklist Consent Form Signed Patient voided / foley secured and emptied When did you last eato 05:30 am Last dose of injectable or oral agent 22:30 pm on 06/12/15 NA Ostomy pouch emptied and vented if applicable NA All implantable devices assessed, documented and approved Intravenous access site secured and place Valuables secured Linens and cotton and cotton/polyester blend (less than 51% polyester) Personal oil-based products / skin lotions / body lotions removed Wigs or hairpieces removed Smoking or tobacco materials removed Books / newspapers / magazines / loose paper removed Cologne, aftershave, perfume and deodorant removed Jewelry removed (may wrap wedding band) Make-up removed Hair care products removed Battery operated devices (external) removed Heating patches and chemical warmers removed NA Titanium eyewear removed Nail polish cured greater than 10 hours NA Casting material cured greater than 10 hours NA Hearing aids  removed NA Loose dentures or partials removed NA Prosthetics have been removed Patient demonstrates correct use of air break device (if applicable) Patient concerns have been addressed Patient grounding bracelet on and cord attached to chamber Specifics for Inpatients (complete in addition to above) Medication sheet sent with patient [name], [name] (  [id number]) Intravenous medications needed or due during therapy sent with patient Drainage tubes (e.g. nasogastric tube or chest tube secured and vented) Endotracheal or Tracheotomy tube secured Cuff deflated of air and inflated with saline Airway suctioned Electronic Signature(s) Signed: 06/13/2015 3:24:36 PM By: Dayton Martes RCP, RRT, CHT Entered By: Dayton Martes on 06/13/2015 13:07:06

## 2015-06-13 NOTE — Progress Notes (Addendum)
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 By: Elpidio Eric [name], RN Previous Signature: 06/13/2015 3:22:41 PM Version By: Elpidio Eric [name], RN Entered By: Elpidio Eric on 06/19/2015 12:12:46 [name] ([id number]) -------------------------------------------------------------------------------- Wound Assessment Details [name] Date of Service: 06/13/2015 3:00 PM Patient Name: N. Patient Account Number: [id number] Medical Record [name], RN, [name], [id number] Treating RN: Number: [place] Sink Date of Birth/Sex: 05/13/1981 (33 y.o. Female) Other Clinician: Primary Care Physician: [name] Treating [name] Referring Physician: [name] Physician/Extender: Tania Ade in Treatment: 11 Wound Status Wound Number: 4 Primary Diabetic Wound/Ulcer of the Lower Etiology: Extremity Wound Location: Left Foot - Dorsal Wound Healed - Epithelialized Wounding Event: Gradually Appeared Status: Date Acquired: 04/29/2015 Comorbid Anemia, Type I Diabetes, End Stage Weeks Of Treatment: 5 History: Renal Disease, Rheumatoid Arthritis, Clustered Wound: No Neuropathy Photos Photo Uploaded By: Elpidio Eric on 06/13/2015 17:15:47 Wound Measurements Length: (cm) 0 % Reduction in Width: (cm) 0 % Reduction in Depth: (cm) 0 Epithelializat Area: (cm) 0 Tunneling: Volume: (cm) 0 Undermining: Area: 100% Volume: 100% ion: Large (67-100%) No No Wound Description Classification: Grade 1 Exudate Amount: None Present Wound Bed Granulation Amount: None Present (0%) Exposed Structure Necrotic Amount: None Present (0%) Fascia Exposed: No Fat Layer Exposed: No Tendon Exposed: No Muscle Exposed: No Tangen, Janiah N. ([id number]) Joint Exposed: No Bone Exposed: No Limited to Skin Breakdown Periwound Skin Texture Texture Color No  Abnormalities Noted: No No Abnormalities Noted: No Callus: No Atrophie Blanche: No Crepitus: No Cyanosis: No Excoriation: No Ecchymosis: No Fluctuance: No Erythema: No Friable: No Hemosiderin Staining: No Induration: No Mottled: No Localized Edema: Yes Pallor: No Rash: No Rubor: No Scarring: No Temperature / Pain Moisture Temperature: No Abnormality No Abnormalities Noted: No Dry / Scaly: No Maceration: No Moist: Yes Wound Preparation Ulcer Cleansing: Rinsed/Irrigated with Saline Topical Anesthetic Applied: None Electronic Signature(s) Signed: 06/13/2015 3:25:01 PM By: Elpidio Eric [name], RN Entered By: Elpidio Eric on 06/13/2015 15:25:01 [name]  ([id number]) -------------------------------------------------------------------------------- Wound Assessment Details [name] Date of Service: 06/13/2015 3:00 PM Patient Name: N. Patient Account Number: [id number] Medical Record [name], RN, [name], [id number] Treating RN: Number: [place] Sink Date of Birth/Sex: August 07, 1981 (33 y.o. Female) Other Clinician: Primary Care Physician: [name] Treating [name] Referring Physician: [name] Physician/Extender: Tania Ade in Treatment: 11 Wound Status Wound Number: 5 Primary Etiology: Skin Tear Wound Location: Right, Lateral Toe Great Wound Status: Open Wounding Event: Gradually Appeared Date Acquired: 05/28/2015 Weeks Of Treatment: 2 Clustered Wound: No Photos Photo Uploaded By: Elpidio Eric on 06/13/2015 17:17:16 Wound Measurements Length: (cm) 1 Width: (cm) 0.6 Depth: (cm) 0.1 Area: (cm) 0.471 Volume: (cm) 0.047 % Reduction in Area: -50% % Reduction in Volume: -51.6% Wound Description Classification: Partial Thickness Periwound Skin Texture Texture Color No Abnormalities Noted: No No Abnormalities Noted: No Moisture No Abnormalities Noted: No Electronic Signature(s) SYANNA, REMMERT ([id number]) Signed: 06/13/2015 5:25:19 PM By: Elpidio Eric [name], RN Entered By: Elpidio Eric on 06/13/2015 15:17:26 [name] ([id number]) -------------------------------------------------------------------------------- Wound Assessment Details [name] Date of Service: 06/13/2015 3:00 PM Patient Name: N. Patient Account Number: [id number] Medical Record [name], RN, [name], [id number] Treating RN: Number: [place] Sink Date of Birth/Sex: [birth year] (33 y.o. Female) Other Clinician: Primary Care Physician: [name] Treating [name] Referring Physician: [name] Physician/Extender: Tania Ade in Treatment: 11 Wound Status Wound Number: 6 Primary Etiology: Skin Tear Wound Location: Left, Lateral  Foot Wound Status: Healed - Epithelialized Wounding Event: Gradually Appeared Date Acquired: 05/28/2015 Weeks Of Treatment: 2 Clustered Wound: No Photos Photo Uploaded By: Elpidio Eric on 06/13/2015 17:17:17 Wound Measurements Length: (cm) 0 Width: (cm) 0 Depth: (cm) 0 Area: (cm) 0 Volume: (cm) 0 % Reduction in Area: 100% % Reduction in Volume: 100% Wound Description Classification: Partial Thickness Periwound Skin Texture Texture Color No Abnormalities Noted: No No Abnormalities Noted: No Moisture No Abnormalities Noted: No Electronic Signature(s) BERNIECE, ABID ([id number]) Signed: 06/13/2015 5:25:19 PM By: Elpidio Eric [name], RN Entered By: Elpidio Eric on 06/13/2015 15:24:10 [name] ([id number]) -------------------------------------------------------------------------------- Wound Assessment Details [name] Date of Service: 06/13/2015 3:00 PM Patient Name: N. Patient Account Number: [id number] Medical Record [name], RN, [name], [id number] Treating RN: Number: [place] Sink Date of Birth/Sex: 08-Nov-1980 (33 y.o. Female) Other Clinician: Primary Care Physician: [name] Treating [name] Referring Physician: [name] Physician/Extender: Tania Ade in Treatment: 11 Wound Status Wound Number: 7 Primary Etiology: Skin Tear Wound Location: Left, Plantar Foot Wound Status: Open Wounding Event: Gradually Appeared Date Acquired: 05/29/2015 Weeks Of Treatment: 2 Clustered Wound: No Photos Photo Uploaded By: Elpidio Eric on 06/13/2015 17:17:17 Wound Measurements Length: (cm) 0.4 Width: (cm) 0.6 Depth: (cm) 0.1 Area: (cm) 0.188 Volume: (cm) 0.019 % Reduction in Area: 20.3% % Reduction in Volume: 20.8% Wound Description Classification: Partial Thickness Periwound Skin Texture Texture Color No Abnormalities Noted: No No Abnormalities Noted: No Moisture No Abnormalities Noted: No Electronic Signature(s) KATHELINE, BRENDLINGER  ([id number]) Signed: 06/13/2015 5:25:19 PM By:  [name], [place] Sink [name], RN Entered By: Elpidio Eric on 06/13/2015 15:13:20 [name] ([id number]) -------------------------------------------------------------------------------- Wound Assessment Details [name] Date of Service: 06/13/2015 3:00 PM Patient Name: N. Patient Account Number: [id number] Medical Record [name], RN, [name], [id number] Treating RN: Number: [place] Sink Date of Birth/Sex: 1981-09-07 (34 y.o. Female) Other Clinician: Primary Care Physician: [name] Treating [name] Referring Physician: [name] Physician/Extender: Tania Ade in Treatment: 11 Wound Status Wound Number: 8 Primary Diabetic Wound/Ulcer of the Lower Etiology: Extremity Wound Location: Left Toe Second Wound Status: Open Wounding Event: Gradually Appeared Date Acquired: 06/05/2015 Weeks Of Treatment: 0 Clustered Wound: No Photos Photo Uploaded By: Elpidio Eric on 06/13/2015 17:20:18 Wound Measurements Length: (cm) 2 Width: (cm) 2.5 Depth: (cm) 0.1 Area: (cm) 3.927 Volume: (cm) 0.393 % Reduction in Area: 9.1% % Reduction in Volume: 9% Periwound Skin Texture Texture Color No Abnormalities Noted: No No Abnormalities Noted: No Moisture No Abnormalities Noted: No Electronic Signature(s) Signed: 06/13/2015 5:25:19 PM By: Elpidio Eric [name], RN Entered By: Elpidio Eric on 06/13/2015 15:17:26 [name] ([id number]) Simeon Craft, Ledell Peoples ([id number]) -------------------------------------------------------------------------------- Vitals Details [name] Date of Service: 06/13/2015 3:00 PM Patient Name: N. Patient Account Number: [id number] Medical Record [name], RN, [name], [id number] Treating RN: Number: [place] Sink Date of Birth/Sex: [birth year] (33 y.o. Female) Other Clinician: Primary Care Physician: [name] Treating [name] Referring Physician: [name] Physician/Extender: Tania Ade in Treatment: 11 Vital  Signs Time Taken: 15:05 Temperature (F): 97.7 Height (in): 68 Pulse (bpm): 86 Weight (lbs): 122 Respiratory Rate (breaths/min): 18 Body Mass Index (BMI): 18.5 Blood Pressure (mmHg): 136/93 Capillary Blood Glucose (mg/dl): 244 Reference Range: 80 - 120 mg / dl Electronic Signature(s) Signed: 06/13/2015 3:24:36 PM By: Dayton Martes RCP, RRT, CHT Previous Signature: 06/13/2015 3:06:01 PM Version By: Elpidio Eric [name], RN Entered By: Dayton Martes on 06/13/2015 15:22:41

## 2015-06-13 NOTE — Progress Notes (Signed)
JAKYAH, BRADBY ([id number]) Visit Report for 06/13/2015 Arrival Information Details YERALDINE, FORNEY 06/13/2015 1:00 Patient Name: Date of Service: N. PM Medical Record Patient Account Number: [id number] Number: Treating RN: Date of Birth/Sex: July 28, 1981 (33 y.o. Female) Other Clinician: Izetta Dakin Primary Care Physician: [name] Treating [name] Referring Physician: [name] Physician/Extender: Tania Ade in Treatment: 11 Visit Information History Since Last Visit Added or deleted any medications: No Patient Arrived: Ambulatory Had a fall or experienced change in No Arrival Time: 12:40 activities of daily living that may affect Accompanied By: mother risk of falls: Transfer Assistance: None Signs or symptoms of abuse/neglect No Patient Identification Verified: Yes since last visito Secondary Verification Yes Hospitalized since last visit: No Process Completed: Has Dressing in Place as Prescribed: Yes Patient Requires No Has Footwear/Offloading in Place as Yes Transmission-Based Prescribed: Precautions: Left: Surgical Shoe with Patient Has Alerts: Yes Pressure Relief Patient Alerts: ABI..[place] bilateral Insole >288mmH Right: Surgical Shoe with Pressure Relief Insole Pain Present Now: No Electronic Signature(s) Signed: 06/13/2015 3:24:36 PM By: Dayton Martes RCP, RRT, CHT Entered By: Dayton Martes on 06/13/2015 13:03:57 [name] ([id number]) -------------------------------------------------------------------------------- Encounter Discharge Information Details JASMINE, MCBETH 06/13/2015 1:00 Patient Name: Date of Service: N. PM Medical Record Patient Account Number: [id number] Number: Treating RN: Date of Birth/Sex: 1980-12-15 (33 y.o. Female) Other Clinician: Izetta Dakin Primary Care Physician: [name] Treating [name] Referring Physician: [name] Physician/Extender: Tania Ade in Treatment: 11 Encounter Discharge Information Items Discharge Pain Level: 0 Discharge Condition: Stable Ambulatory Status: Ambulatory Discharge Destination: Home Private Transportation: Auto Accompanied By: mother Schedule Follow-up Appointment: No Medication Reconciliation completed and No provided to Patient/Care Kevon Tench: Clinical Summary of Care: Notes Patient has an HBO treatment scheduled on 06/14/15 at 13:00 pm. Electronic Signature(s) Signed: 06/13/2015 3:24:36 PM By: Dayton Martes RCP, RRT, CHT Entered By: Dayton Martes on 06/13/2015 15:24:03 [name] ([id number]) -------------------------------------------------------------------------------- Vitals Details Quiros, Kitzia 06/13/2015 1:00 Patient Name: Date of Service: N. PM Medical Record Patient Account Number: [id number] Number: Treating RN: Date of Birth/Sex: 06/18/1981 (33 y.o. Female) Other Clinician: Izetta Dakin Primary Care Physician: [name] Treating [name] Referring Physician: [name] Physician/Extender: Tania Ade in Treatment: 11 Vital Signs Time Taken: 12:44 Temperature (F): 97.9 Height (in): 68 Pulse (bpm): 77 Weight (lbs): 122 Respiratory Rate (breaths/min): 18 Body Mass Index (BMI): 18.5 Blood Pressure (mmHg): 116/54 Capillary Blood Glucose (mg/dl): 937 Reference Range: 80 - 120 mg / dl Electronic Signature(s) Signed: 06/13/2015 3:24:36 PM By: Dayton Martes RCP, RRT, CHT Entered By: Dayton Martes on 06/13/2015 13:05:02

## 2015-06-13 NOTE — Progress Notes (Addendum)
[name], Wu ([id number]) Visit Report for 06/12/2015 HBO Details [name], Wu Date of Service: 06/12/2015 1:00 PM Patient Name: N. Patient Account Number: [id number] Medical Record Treating RN: [id number] Number: Other Clinician: Izetta Wu [birth year] (33 y.o. Treating BURNS III, Date of Birth/Sex: Female) Physician/Extender: [name] Primary Care [name] Physician: Referring Physician: [name] Wu in Treatment: 10 HBO Treatment Course Details Treatment Course Ordering Physician: [name] 1 Number: HBO Treatment Start Date: 06/04/2015 Total Treatments 40 Ordered: HBO Indication: Other (specify in Notes) HBO Treatment Details Treatment Number: 4 Patient Type: Outpatient Chamber Type: Monoplace Chamber #: HBO #[id number] Treatment Protocol: 2.0 ATA with 90 minutes oxygen, and no air breaks Treatment Details Compression Rate Down: 1.5 psi / minute De-Compression Rate Up: 1.5 psi / minute Air breaks and breathing Compress Tx Pressure Decompress Decompress periods Begins Reached Begins Ends (leave unused spaces blank) Chamber Pressure 1 ATA 2.0 ATA - - - - - - 2.0 ATA 1 ATA Clock Time (24 hr) 13:23 13:33 - - - - - - 15:03 15:13 Treatment Length: 110 (minutes) Treatment Segments: 4 Capillary Blood Glucose Pre Capillary Blood Glucose (mg/dl): Post Capillary Blood Glucose (mg/dl): Vital Signs Capillary Blood Glucose Reference Range: 80 - 120 mg / dl HBO Diabetic Blood Glucose Intervention Range: <131 mg/dl or >130 mg/dl Time Vitals Blood Respiratory Capillary Blood Glucose Pulse Action Type: Pulse: Temperature: Taken: Pressure: Rate: Glucose (mg/dl): Meter #: Oximetry (%) Taken: Pre 13:11 139/98 88 18 98.2 271 1 none Post 15:17 155/98 86 18 98.1 [name], [name] ([id number]) Treatment Response Treatment Completion Status: Treatment Completed without Adverse Event HBO Attestation I certify that I supervised this HBO treatment  in accordance with Medicare guidelines. A trained Yes emergency response team is readily available per hospital policies and procedures. Continue HBOT as ordered. Yes Electronic Signature(s) Signed: 06/19/2015 4:26:09 PM By: [name] Bhat MD Previous Signature: 06/12/2015 3:52:54 PM Version By: [name] RCP, RRT, CHT Previous Signature: 06/12/2015 4:56:52 PM Version By: [name] Bhat MD Entered By: [name] on 06/19/2015 16:03:45 [name] ([id number]) -------------------------------------------------------------------------------- HBO Safety Checklist Details [name] Date of Service: 06/12/2015 1:00 PM Patient Name: N. Patient Account Number: [id number] Medical Record Treating RN: [id number] Number: Other Clinician: Izetta Wu 08/05/1981 (33 y.o. Treating BURNS III, Date of Birth/Sex: Female) Physician/Extender: [name] Primary Care [name] Physician: Referring Physician: [name] Wu in Treatment: 10 HBO Safety Checklist Items Safety Checklist Consent Form Signed Patient voided / foley secured and emptied When did you last eato 18:30 pm on 06/11/15 Last dose of injectable or oral agent 18:30 pm on 06/11/15 NA Ostomy pouch emptied and vented if applicable NA All implantable devices assessed, documented and approved Intravenous access site secured and place Valuables secured Linens and cotton and cotton/polyester blend (less than 51% polyester) Personal oil-based products / skin lotions / body lotions removed Wigs or hairpieces removed Smoking or tobacco materials removed Books / newspapers / magazines / loose paper removed Cologne, aftershave, perfume and deodorant removed Jewelry removed (may wrap wedding band) Make-up removed Hair care products removed Battery operated devices (external) removed Heating patches and chemical warmers removed NA Titanium eyewear removed Nail polish cured greater  than 10 hours NA Casting material cured greater than 10 hours NA Hearing aids removed NA Loose dentures or partials removed NA Prosthetics have been removed Patient demonstrates correct use of air break device (if applicable) Patient concerns have been addressed Patient grounding bracelet on and cord attached to chamber Specifics for Inpatients (  complete in addition to above) [name], [name] ([id number]) Medication sheet sent with patient Intravenous medications needed or due during therapy sent with patient Drainage tubes (e.g. nasogastric tube or chest tube secured and vented) Endotracheal or Tracheotomy tube secured Cuff deflated of air and inflated with saline Airway suctioned Electronic Signature(s) Signed: 06/12/2015 3:52:54 PM By: [name] RCP, RRT, CHT Entered By: [name] on 06/12/2015 13:18:19

## 2015-06-14 ENCOUNTER — Encounter: Payer: Medicare Other | Admitting: Surgery

## 2015-06-14 DIAGNOSIS — L97429 Non-pressure chronic ulcer of left heel and midfoot with unspecified severity: Secondary | ICD-10-CM | POA: Diagnosis not present

## 2015-06-14 LAB — GLUCOSE, CAPILLARY
GLUCOSE-CAPILLARY: 152 mg/dL — AB (ref 65–99)
Glucose-Capillary: 241 mg/dL — ABNORMAL HIGH (ref 65–99)

## 2015-06-14 NOTE — Progress Notes (Signed)
[name], [name] ([id number]) Visit Report for 06/14/2015 HBO Details [name] 06/14/2015 1:00 Patient Name: Date of Service: N. PM Medical Record Patient Account Number: [id number] Number: Treating RN: Date of Birth/Sex: 1980-11-05 (33 y.o. Female) Other Clinician: Izetta Wu Primary Care Physician: [name] [name] Referring Physician: Rolin Wu Physician/Extender: [name] in Treatment: 11 HBO Treatment Course Details Treatment Course Ordering Physician: [name] 1 Number: HBO Treatment Start Date: 06/04/2015 Total Treatments 40 Ordered: HBO Indication: Other (specify in Notes) HBO Treatment Details Treatment Number: 6 Patient Type: Outpatient Chamber Type: Monoplace Chamber #: HBO #[id number] Treatment Protocol: 2.0 ATA with 90 minutes oxygen, and no air breaks Treatment Details Compression Rate Down: 1.5 psi / minute De-Compression Rate Up: 1.5 psi / minute Air breaks and breathing Compress Tx Pressure Decompress Decompress periods Begins Reached Begins Ends (leave unused spaces blank) Chamber Pressure 1 ATA 2.0 ATA - - - - - - 2.0 ATA 1 ATA Clock Time (24 hr) 11:33 11:43 - - - - - - 13:13 13:23 Treatment Length: 110 (minutes) Treatment Segments: 4 Capillary Blood Glucose Pre Capillary Blood Glucose (mg/dl): Post Capillary Blood Glucose (mg/dl): Vital Signs Capillary Blood Glucose Reference Range: 80 - 120 mg / dl HBO Diabetic Blood Glucose Intervention Range: <131 mg/dl or >989 mg/dl Time Vitals Blood Respiratory Capillary Blood Glucose Pulse Action Type: Pulse: Temperature: Taken: Pressure: Rate: Glucose (mg/dl): Meter #: Oximetry (%) Taken: Pre 11:21 136/95 86 18 98.1 241 1 none Post 13:28 155/117 86 18 97.8 152 1 none Treatment Response [name], [name] ([id number]) Treatment Completion Status: Treatment Completed without Adverse Event HBO Attestation I certify that I supervised this HBO  treatment in accordance with Medicare guidelines. A trained Yes emergency response team is readily available per hospital policies and procedures. Continue HBOT as ordered. Yes Electronic Signature(s) Signed: 06/14/2015 1:43:08 PM By: [name] Kanner MD, FACS Entered By: [name] on 06/14/2015 13:43:08 [name] ([id number]) -------------------------------------------------------------------------------- HBO Safety Checklist Details [name], [name] 06/14/2015 1:00 Patient Name: Date of Service: N. PM Medical Record Patient Account Number: [id number] Number: Treating RN: Date of Birth/Sex: 1981-03-12 (33 y.o. Female) Other Clinician: Izetta Wu Primary Care Physician: [name] [name] Referring Physician: Rolin Wu Physician/Extender: [name] in Treatment: 11 HBO Safety Checklist Items Safety Checklist Consent Form Signed Patient voided / foley secured and emptied When did you last eato 22:30 pm on 06/13/15 Last dose of injectable or oral agent 20:00 pm on 06/13/15 NA Ostomy pouch emptied and vented if applicable NA All implantable devices assessed, documented and approved Intravenous access site secured and place Valuables secured Linens and cotton and cotton/polyester blend (less than 51% polyester) Personal oil-based products / skin lotions / body lotions removed Wigs or hairpieces removed Smoking or tobacco materials removed Books / newspapers / magazines / loose paper removed Cologne, aftershave, perfume and deodorant removed Jewelry removed (may wrap wedding band) Make-up removed Hair care products removed Battery operated devices (external) removed Heating patches and chemical warmers removed NA Titanium eyewear removed Nail polish cured greater than 10 hours NA Casting material cured greater than 10 hours NA Hearing aids removed NA Loose dentures or partials removed NA Prosthetics have been removed Patient  demonstrates correct use of air break device (if applicable) Patient concerns have been addressed Patient grounding bracelet on and cord attached to chamber Specifics for Inpatients (complete in addition to above) Medication sheet sent with patient [name], [name] ([id number]) Intravenous medications needed or due during therapy sent with patient  Drainage tubes (e.g. nasogastric tube or chest tube secured and vented) Endotracheal or Tracheotomy tube secured Cuff deflated of air and inflated with saline Airway suctioned Electronic Signature(s) Signed: 06/14/2015 2:33:58 PM By: [name] RCP, RRT, CHT Entered By: [name] on 06/14/2015 11:46:11

## 2015-06-14 NOTE — Progress Notes (Signed)
[name], [name] ([id number]) Visit Report for 06/14/2015 Arrival Information Details [name], [name] 06/14/2015 1:00 Patient Name: Date of Service: N. PM Medical Record Patient Account Number: [id number] Number: Treating RN: Date of Birth/Sex: January 07, 1981 (33 y.o. Female) Other Clinician: Izetta Dakin Primary Care Physician: [name] Treating [name] Referring Physician: [name] Physician/Extender: Tania Ade in Treatment: 11 Visit Information History Since Last Visit Added or deleted any medications: No Patient Arrived: Ambulatory Any new allergies or adverse reactions: No Arrival Time: 11:43 Had a fall or experienced change in No Accompanied By: neighbor activities of daily living that may affect Transfer Assistance: None risk of falls: Patient Identification Verified: Yes Signs or symptoms of abuse/neglect No Secondary Verification Yes since last visito Process Completed: Hospitalized since last visit: No Patient Requires No Has Dressing in Place as Prescribed: Yes Transmission-Based Has Footwear/Offloading in Place as Yes Precautions: Prescribed: Patient Has Alerts: Yes Left: Surgical Shoe with Patient Alerts: ABI..[place] bilateral Pressure Relief >274mmH Insole Right: Surgical Shoe with Pressure Relief Insole Pain Present Now: No Electronic Signature(s) Signed: 06/14/2015 2:33:58 PM By: Dayton Martes RCP, RRT, CHT Entered By: Dayton Martes on 06/14/2015 11:44:28 [name] ([id number]) -------------------------------------------------------------------------------- Encounter Discharge Information Details [name], [name] 06/14/2015 1:00 Patient Name: Date of Service: N. PM Medical Record Patient Account Number: [id number] Number: Treating RN: Date of Birth/Sex: August 20, 1981 (33 y.o. Female) Other Clinician: Izetta Dakin Primary Care Physician: [name]  Treating [name] Referring Physician: [name] Physician/Extender: Tania Ade in Treatment: 11 Encounter Discharge Information Items Discharge Pain Level: 0 Discharge Condition: Stable Ambulatory Status: Ambulatory Discharge Destination: Home Private Transportation: Auto Accompanied By: neighbor Schedule Follow-up Appointment: No Medication Reconciliation completed and No provided to Patient/Care Provider: Clinical Summary of Care: Notes Patient has an HBO treatment scheduled on 06/17/15 at 13:00 pm. Electronic Signature(s) Signed: 06/14/2015 2:33:58 PM By: Dayton Martes RCP, RRT, CHT Entered By: Dayton Martes on 06/14/2015 13:43:00 [name] ([id number]) -------------------------------------------------------------------------------- Vitals Details [name], [name] 06/14/2015 1:00 Patient Name: Date of Service: N. PM Medical Record Patient Account Number: [id number] Number: Treating RN: Date of Birth/Sex: Feb 16, 1981 (33 y.o. Female) Other Clinician: Izetta Dakin Primary Care Physician: [name] Treating [name] Referring Physician: [name] Physician/Extender: Tania Ade in Treatment: 11 Vital Signs Time Taken: 11:21 Temperature (F): 98.1 Height (in): 68 Pulse (bpm): 86 Weight (lbs): 122 Respiratory Rate (breaths/min): 18 Body Mass Index (BMI): 18.5 Blood Pressure (mmHg): 136/95 Capillary Blood Glucose (mg/dl): 115 Reference Range: 80 - 120 mg / dl Electronic Signature(s) Signed: 06/14/2015 2:33:58 PM By: Dayton Martes RCP, RRT, CHT Entered By: Dayton Martes on 06/14/2015 11:45:05

## 2015-06-14 NOTE — Progress Notes (Addendum)
[name], [name] ([id number]) Visit Report for 06/13/2015 Chief Complaint Document Details [name], [name] Date of Service: 06/13/2015 3:00 PM Patient Name: N. Patient Account Number: [id number] Medical Record [name], RN, [name], [id number] Treating RN: Number: [place] Sink 1980-10-28 (33 y.o. Other Clinician: Date of Birth/Sex: Female) Treating Jaeden [place] Primary Care Physician: [name] Physician/Extender: Referring Physician: [name] in Treatment: 11 Information Obtained from: Patient Chief Complaint The patient is back today with 3 weeks treatment at [facility] where she is had several procedures done and was found to ultimately have a dry gangrene of the left forefoot. Electronic Signature(s) Signed: 06/13/2015 3:34:22 PM By: [name] MD, FACS Entered By: [name] on 06/13/2015 15:34:22 [name] ([id number]) -------------------------------------------------------------------------------- HPI Details [name] Date of Service: 06/13/2015 3:00 PM Patient Name: N. Patient Account Number: [id number] Medical Record [name], RN, [name], [id number] Treating RN: Number:  Sink 12/23/1980 (33 y.o. Other Clinician: Date of Birth/Sex: Female) Treating Barak Bialecki Primary Care Physician: [name] Physician/Extender: Referring Physician: [name] in Treatment: 11 History of Present Illness Location: right heel ulcer Quality: Patient reports No Pain. Severity: Patient states wound are getting worse. Duration: Patient has had the wound for > 2 months prior to seeking treatment at the wound center Context: The wound occurred when the patient injured her foot on something sharp on her deck. Modifying Factors: Consults to this date include:antibiotics which included Keflex in about a month ago and now at present time she is on clindamycin. Associated Signs and Symptoms: Patient reports having difficulty standing for long periods. HPI  Description: [name] is a 34 y.o. female who presents to our wound center referred by her PCP Dr. Zada Finders for nonhealing ulcers on the lateral aspect of the right heel. The patient reports about 10 weeks ago she suffered an injury of the right heel. The wounds got infected, she was given Cephalexin and the use of Bactroban. Of note she has a history of type 1 diabetes mellitus that has been uncontrolled. Past medical history significant for type 1 diabetes mellitus not controlled, ankylosing spondylitis, anorexia nervosa, irritable bowel syndrome, chronic kidney disease, chronic diarrhea. She has been treated by her PCP with duoderm which is to be changed every 3 days. Her last hemoglobin A1c in March was 11. 05/08/2015 -- the patient was seen here once on 03/28/2015 and then once in 04/17/2015. He has been very irregular with her appointments for various reasons. She says about 2 weeks ago she was in a swimming pool and she injured her left big toe and had a blister which she started picking at. She says she's been applying some allergen over this. She's noticed that her toes on this foot and the skin over the dorsum of forefoot have turned red. She does not have any fever or any other significant signs of infection. Addendum: after seeing the patient I have personally spoken to her PCP Dr. [name] and discussed the recent cellulitis she's developed and recommended that she get IV antibiotics and inpatient care for a few days. 05/30/2015 -- the patient is here today with her mother and after her last visit on 8/10 she was seen in the ER at [facility] and they started her on oral antibiotics and got worked up with vascular surgery. 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 She was asked to follow-  up with the wound center today for  starting of hyperbaric oxygen therapy as soon as possible. 06/07/2015 -- after the initial treatment of hyperbaric oxygen therapy she started having ear problems and had ultimately to use myringotomy tubes and this was done today. Since then she's been doing fine. Electronic Signature(s) Signed: 06/13/2015 3:34:29 PM By: [name] MD, FACS [facility], Ledell Peoples ([id number]) Entered By: [name] on 06/13/2015 15:34:29 [name] ([id number]) -------------------------------------------------------------------------------- Physical Exam Details [name] Date of Service: 06/13/2015 3:00 PM Patient Name: N. Patient Account Number: [id number] Medical Record [name], RN, [name], [id number] Treating RN: Number: [place] Sink Oct 15, 1980 (33 y.o. Other Clinician: Date of Birth/Sex: Female) Treating Traeson Dusza Primary Care Physician: [name] Physician/Extender: Referring Physician: [name] in Treatment: 11 Constitutional . Pulse regular. Respirations normal and unlabored. Afebrile. . Eyes Nonicteric. Reactive to light. Ears, Nose, Mouth, and Throat Lips, teeth, and gums WNL.Marland Kitchen Moist mucosa without lesions . Neck supple and nontender. No palpable supraclavicular or cervical adenopathy. Normal sized without goiter. Respiratory WNL. No retractions.. Cardiovascular Pedal Pulses WNL. No clubbing, cyanosis or edema. Lymphatic No adneopathy. No adenopathy. No adenopathy. Musculoskeletal Adexa without tenderness or enlargement.. Digits and nails w/o clubbing, cyanosis, infection, petechiae, ischemia, or inflammatory conditions.. Integumentary (Hair, Skin) No suspicious lesions. No crepitus or fluctuance. No peri-wound warmth or erythema. No masses.Marland Kitchen Psychiatric Judgement and insight Intact.. No evidence of depression, anxiety, or agitation.. Notes Her left leg has dry gangrene of the first toe and minimal of the second toe. Her right calcaneum seems to be  doing fine and we will continue with silver alginate. Electronic Signature(s) Signed: 06/13/2015 3:35:06 PM By: [name] MD, FACS Entered By: [name] on 06/13/2015 15:35:05 [name] ([id number]) -------------------------------------------------------------------------------- Physician Orders Details [name] Date of Service: 06/13/2015 3:00 PM Patient Name: N. Patient Account Number: [id number] Medical Record [name], RN, [name], [id number] Treating RN: Number: [place] Sink June 16, 1981 (33 y.o. 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 Wound #3 Left,Plantar,Circumferential Toe Great o Cleanse wound with mild soap and water o May Shower, gently pat wound dry prior to applying new dressing. Wound #5 Right,Lateral Toe Great o Cleanse wound with mild soap and water o May Shower, gently pat wound dry prior to applying new dressing. Wound #7 Left,Plantar Foot o Cleanse wound with mild soap and water o May Shower, gently pat wound dry prior to applying new dressing. Wound #8 Left Toe Second o  Cleanse wound with mild soap and water o May Shower, gently pat wound dry prior to applying new dressing. Anesthetic Wound #1 Right,Distal Calcaneous o Topical Lidocaine 4% cream applied to wound bed prior to debridement Fitting, Kelsay N. ([id number]) Wound #3 Left,Plantar,Circumferential Toe Great o Topical Lidocaine 4% cream applied to wound bed prior  to debridement Wound #5 Right,Lateral Toe Great o Topical Lidocaine 4% cream applied to wound bed prior to debridement Wound #7 Left,Plantar Foot o Topical Lidocaine 4% cream applied to wound bed prior to debridement Wound #8 Left Toe Second o Topical Lidocaine 4% cream applied to wound bed prior to debridement Primary Wound Dressing Wound #1 Right,Distal Calcaneous o Aquacel Ag Wound #3 Left,Plantar,Circumferential Toe Great o Other: - betadine paint Wound #5 Right,Lateral Toe Great o Aquacel Ag Wound #7 Left,Plantar Foot o Aquacel Ag Secondary Dressing Wound #1 Right,Distal Calcaneous o Boardered Foam Dressing Wound #3 Left,Plantar,Circumferential Toe Great o Conform/Kerlix Wound #5 Right,Lateral Toe Great o Boardered Foam Dressing Wound #7 Left,Plantar Foot o Boardered Foam Dressing Dressing Change Frequency Wound #1 Right,Distal Calcaneous o Change dressing every other day. Wound #3 Left,Plantar,Circumferential Toe Great o Change dressing every other day. Wound #5 Right,Lateral Toe Great o Change dressing every other day. [name], [name] ([id number]) Wound #7 Left,Plantar Foot o Change dressing every other day. Wound #8 Left Toe Second o Change dressing every other day. Follow-up Appointments Wound #1 Right,Distal Calcaneous o Return Appointment in 1 week. Wound #3 Left,Plantar,Circumferential Toe Great o Return Appointment in 1 week. Wound #5 Right,Lateral Toe Great o Return Appointment in 1 week. Wound #7 Left,Plantar Foot o Return Appointment in 1  week. Wound #8 Left Toe Second o Return Appointment in 1 week. Off-Loading Wound #1 Right,Distal Calcaneous o Other: - darco with peg assist Wound #3 Left,Plantar,Circumferential Toe Great o Other: - darco with peg assist Wound #5 Right,Lateral Toe Great o Other: - darco with peg assist Wound #7 Left,Plantar Foot o Other: - darco with peg assist Wound #8 Left Toe Second o Other: - darco with peg assist Electronic Signature(s) Signed: 06/13/2015 3:39:24 PM By: Elpidio Eric [name], RN Signed: 06/13/2015 4:21:04 PM By: [name] MD, FACS Entered By: Elpidio Eric on 06/13/2015 15:39:23 [name] ([id number]) -------------------------------------------------------------------------------- Problem List Details [name] Date of Service: 06/13/2015 3:00 PM Patient Name: N. Patient Account Number: [id number] Medical Record [name], RN, [name], [id number] Treating RN: Number: [place] Sink 12-May-1981 (33 y.o. Other Clinician: Date of Birth/Sex: Female) Treating Eivin Mascio Primary Care Physician: [name] Physician/Extender: Referring Physician: [name] in Treatment: 11 Active Problems ICD-10 Encounter Code Description Active Date Diagnosis E10.621 Type 1 diabetes mellitus with foot ulcer 03/28/2015 Yes L97.412 Non-pressure chronic ulcer of right heel and midfoot with 03/28/2015 Yes fat layer exposed L97.429 Non-pressure chronic ulcer of left heel and midfoot with 05/09/2015 Yes unspecified severity L03.116 Cellulitis of left lower limb 05/09/2015 Yes I70.245 Atherosclerosis of native arteries of left leg with ulceration 05/30/2015 Yes of other part of foot E10.52 Type 1 diabetes mellitus with diabetic peripheral 05/30/2015 Yes angiopathy with gangrene Inactive Problems Resolved Problems Electronic Signature(s) Signed: 06/13/2015 3:34:16 PM By: [name] MD, FACS Entered By: [name] on 06/13/2015 15:34:15 [name]  ([id number] Craft, Ledell Peoples ([id number]) -------------------------------------------------------------------------------- Progress Note Details [name] Date of Service: 06/13/2015 3:00 PM Patient Name: N. Patient Account Number: [id number] Medical Record [name], RN, [name], [id number] Treating RN: Number: [place] Sink 1981/05/08 (33 y.o. Other Clinician: Date of Birth/Sex: Female) Treating Yenny Kosa Primary Care Physician: [name] Physician/Extender: Referring Physician: [name] in Treatment: 11 Subjective Chief Complaint Information obtained from Patient The patient is back today with 3 weeks treatment at [facility] where she is had several procedures done and was found to ultimately have a dry gangrene of the left forefoot. History of Present Illness (HPI) The following HPI  elements were documented for the patient's wound: Location: right heel ulcer Quality: Patient reports No Pain. Severity: Patient states wound are getting worse. Duration: Patient has had the wound for > 2 months prior to seeking treatment at the wound center Context: The wound occurred when the patient injured her foot on something sharp on her deck. Modifying Factors: Consults to this date include:antibiotics which included Keflex in about a month ago and now at present time she is on clindamycin. Associated Signs and Symptoms: Patient reports having difficulty standing for long periods. Carely Nappier is a 34 y.o. female who presents to our wound center referred by her PCP Dr. Zada Finders for nonhealing ulcers on the lateral aspect of the right heel. The patient reports about 10 weeks ago she suffered an injury of the right heel. The wounds got infected, she was given Cephalexin and the use of Bactroban. Of note she has a history of type 1 diabetes mellitus that has been uncontrolled. Past medical history significant for type 1 diabetes mellitus not controlled, ankylosing spondylitis,  anorexia nervosa, irritable bowel syndrome, chronic kidney disease, chronic diarrhea. She has been treated by her PCP with duoderm which is to be changed every 3 days. Her last hemoglobin A1c in March was 11. 05/08/2015 -- the patient was seen here once on 03/28/2015 and then once in 04/17/2015. He has been very irregular with her appointments for various reasons. She says about 2 weeks ago she was in a swimming pool and she injured her left big toe and had a blister which she started picking at. She says she's been applying some allergen over this. She's noticed that her toes on this foot and the skin over the dorsum of forefoot have turned red. She does not have any fever or any other significant signs of infection. Addendum: after seeing the patient I have personally spoken to her PCP Dr. [name] and discussed the recent cellulitis she's developed and recommended that she get IV antibiotics and inpatient care for a few days. 05/30/2015 -- the patient is here today with her mother and after her last visit on 8/10 she was seen in the ER at [facility] and they started her on oral antibiotics and got worked up with vascular [place], Nikaela N. ([id number]) surgery. 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 She was asked to follow- up with the wound center today for starting of hyperbaric oxygen therapy as soon as possible. 06/07/2015 -- after the initial treatment of hyperbaric oxygen therapy she started having ear problems and had ultimately to use myringotomy tubes and this was done today. Since then she's been doing fine. Objective Constitutional Pulse regular. Respirations normal and unlabored. Afebrile. Vitals Time Taken: 3:05 PM, Height: 68 in, Weight: 122 lbs, BMI: 18.5, Temperature: 97.7 F, Pulse: 86 bpm, Respiratory Rate: 18  breaths/min, Blood Pressure: 136/93 mmHg, Capillary Blood Glucose: 155 mg/dl. Eyes Nonicteric. Reactive to light. Ears, Nose, Mouth, and Throat Lips, teeth, and gums WNL.Marland Kitchen Moist mucosa without lesions . Neck supple and nontender. No palpable supraclavicular or cervical adenopathy. Normal sized without goiter. Respiratory WNL. No retractions.. Cardiovascular Pedal Pulses WNL. No clubbing, cyanosis or edema. Lymphatic No adneopathy. No adenopathy. No adenopathy. Musculoskeletal Adexa without tenderness or enlargement.. Digits and nails w/o clubbing, cyanosis, infection, petechiae, ischemia, or inflammatory conditions.Marland Kitchen Psychiatric Judgement and insight Intact.. No evidence of  depression, anxiety, or agitation.. General Notes: Her left leg has dry gangrene of the first toe and minimal of the second toe. Her right calcaneum seems to be doing fine and we will continue with silver alginate. [name], [name] ([id number]) Integumentary (Hair, Skin) No suspicious lesions. No crepitus or fluctuance. No peri-wound warmth or erythema. No masses.. Wound #1 status is Open. Original cause of wound was Gradually Appeared. The wound is located on the Right,Distal Calcaneous. The wound measures 0.5cm length x 1cm width x 0.1cm depth; 0.393cm^2 area and 0.039cm^3 volume. The wound is limited to skin breakdown. There is no tunneling or undermining noted. There is a none present amount of drainage noted. The wound margin is distinct with the outline attached to the wound base. There is no granulation within the wound bed. There is a large (67-100%) amount of necrotic tissue within the wound bed including Eschar. The periwound skin appearance exhibited: Localized Edema, Dry/Scaly. The periwound skin appearance did not exhibit: Callus, Crepitus, Excoriation, Fluctuance, Friable, Induration, Rash, Scarring, Maceration, Moist, Atrophie Blanche, Cyanosis, Ecchymosis, Hemosiderin Staining, Mottled, Pallor,  Rubor, Erythema. Periwound temperature was noted as No Abnormality. Wound #3 status is Open. Original cause of wound was Shear/Friction. The wound is located on the Left,Plantar,Circumferential Toe Great. The wound measures 6cm length x 8cm width x 0.1cm depth; 37.699cm^2 area and 3.77cm^3 volume. The wound is limited to skin breakdown. There is no tunneling or undermining noted. There is a none present amount of drainage noted. The wound margin is indistinct and nonvisible. There is no granulation within the wound bed. There is a large (67-100%) amount of necrotic tissue within the wound bed including Eschar. The periwound skin appearance exhibited: Localized Edema, Dry/Scaly. The periwound skin appearance did not exhibit: Callus, Crepitus, Excoriation, Fluctuance, Friable, Induration, Rash, Scarring, Maceration, Moist, Atrophie Blanche, Cyanosis, Ecchymosis, Hemosiderin Staining, Mottled, Pallor, Rubor, Erythema. Periwound temperature was noted as No Abnormality. Wound #4 status is Healed - Epithelialized. Original cause of wound was Gradually Appeared. The wound is located on the Left,Dorsal Foot. The wound measures 0cm length x 0cm width x 0cm depth; 0cm^2 area and 0cm^3 volume. The wound is limited to skin breakdown. There is no tunneling or undermining noted. There is a none present amount of drainage noted. There is no granulation within the wound bed. There is no necrotic tissue within the wound bed. The periwound skin appearance exhibited: Localized Edema, Moist. The periwound skin appearance did not exhibit: Callus, Crepitus, Excoriation, Fluctuance, Friable, Induration, Rash, Scarring, Dry/Scaly, Maceration, Atrophie Blanche, Cyanosis, Ecchymosis, Hemosiderin Staining, Mottled, Pallor, Rubor, Erythema. Periwound temperature was noted as No Abnormality. Wound #5 status is Open. Original cause of wound was Gradually Appeared. The wound is located on the [employer]. The  wound measures 1cm length x 0.6cm width x 0.1cm depth; 0.471cm^2 area and 0.047cm^3 volume. Wound #6 status is Healed - Epithelialized. Original cause of wound was Gradually Appeared. The wound is located on the Left,Lateral Foot. The wound measures 0cm length x 0cm width x 0cm depth; 0cm^2 area and 0cm^3 volume. Wound #7 status is Open. Original cause of wound was Gradually Appeared. The wound is located on the Left,Plantar Foot. The wound measures 0.4cm length x 0.6cm width x 0.1cm depth; 0.188cm^2 area and 0.019cm^3 volume. Wound #8 status is Open. Original cause of wound was Gradually Appeared. The wound is located on the Left Toe Second. The wound measures 2cm length x 2.5cm width x 0.1cm depth; 3.927cm^2 area and 0.393cm^3 volume. [name], [name] ([id number])  Assessment Active Problems ICD-10 E10.621 - Type 1 diabetes mellitus with foot ulcer L97.412 - Non-pressure chronic ulcer of right heel and midfoot with fat layer exposed L97.429 - Non-pressure chronic ulcer of left heel and midfoot with unspecified severity L03.116 - Cellulitis of left lower limb I70.245 - Atherosclerosis of native arteries of left leg with ulceration of other part of foot E10.52 - Type 1 diabetes mellitus with diabetic peripheral angiopathy with gangrene Overall she is holding steady and but for missing one hyperbaric oxygen therapy on Tuesday because of diarrhea she is tolerated the HBO treatments well. Her myringotomy tubes are functioning well. Local dressing care has been discussed with her and she is being very compliant. Plan Wound Cleansing: Wound #1 Right,Distal Calcaneous: Cleanse wound with mild soap and water May Shower, gently pat wound dry prior to applying new dressing. Wound #3 Left,Plantar,Circumferential Toe Great: Cleanse wound with mild soap and water May Shower, gently pat wound dry prior to applying new dressing. Wound #5 Right,Lateral Toe Great: Cleanse wound with mild soap and  water May Shower, gently pat wound dry prior to applying new dressing. Wound #7 Left,Plantar Foot: Cleanse wound with mild soap and water May Shower, gently pat wound dry prior to applying new dressing. Wound #8 Left Toe Second: Cleanse wound with mild soap and water May Shower, gently pat wound dry prior to applying new dressing. Anesthetic: Wound #1 Right,Distal Calcaneous: Topical Lidocaine 4% cream applied to wound bed prior to debridement Wound #3 Left,Plantar,Circumferential Toe Great: Topical Lidocaine 4% cream applied to wound bed prior to debridement Wound #5 Right,Lateral Toe Great: Topical Lidocaine 4% cream applied to wound bed prior to debridement [name], [name] ([id number]) Wound #7 Left,Plantar Foot: Topical Lidocaine 4% cream applied to wound bed prior to debridement Wound #8 Left Toe Second: Topical Lidocaine 4% cream applied to wound bed prior to debridement Primary Wound Dressing: Wound #1 Right,Distal Calcaneous: Aquacel Ag Wound #3 Left,Plantar,Circumferential Toe Great: Other: - betadine paint Wound #5 Right,Lateral Toe Great: Aquacel Ag Wound #7 Left,Plantar Foot: Aquacel Ag Secondary Dressing: Wound #1 Right,Distal Calcaneous: Boardered Foam Dressing Wound #3 Left,Plantar,Circumferential Toe Great: Conform/Kerlix Wound #5 Right,Lateral Toe Great: Boardered Foam Dressing Wound #7 Left,Plantar Foot: Boardered Foam Dressing Dressing Change Frequency: Wound #1 Right,Distal Calcaneous: Change dressing every other day. Wound #3 Left,Plantar,Circumferential Toe Great: Change dressing every other day. Wound #5 Right,Lateral Toe Great: Change dressing every other day. Wound #7 Left,Plantar Foot: Change dressing every other day. Wound #8 Left Toe Second: Change dressing every other day. Follow-up Appointments: Wound #1 Right,Distal Calcaneous: Return Appointment in 1 week. Wound #3 Left,Plantar,Circumferential Toe Great: Return Appointment in 1  week. Wound #5 Right,Lateral Toe Great: Return Appointment in 1 week. Wound #7 Left,Plantar Foot: Return Appointment in 1 week. Wound #8 Left Toe Second: Return Appointment in 1 week. Off-Loading: Wound #1 Right,Distal Calcaneous: Other: - darco with peg assist Wound #3 Left,Plantar,Circumferential Toe Great: Other: - darco with peg assist Wound #5 Right,Lateral Toe Great: Other: - darco with peg assist [name], [name] ([id number]) Wound #7 Left,Plantar Foot: Other: - darco with peg assist Wound #8 Left Toe Second: Other: - darco with peg assist Overall she is holding steady and but for missing one hyperbaric oxygen therapy on Tuesday because of diarrhea she is tolerated the HBO treatments well. Her myringotomy tubes are functioning well. Local dressing care has been discussed with her and she is being very compliant. Electronic Signature(s) Signed: 06/20/2015 3:46:22 PM By: [name] MD, FACS Previous Signature: 06/13/2015  4:22:33 PM Version By: [name] MD, FACS Previous Signature: 06/13/2015 3:36:40 PM Version By: [name] MD, FACS Entered By: [name] on 06/20/2015 15:46:22 [name] ([id number]) -------------------------------------------------------------------------------- Loran Senters Date of Service: 06/13/2015 Patient Name: N. Patient Account Number: [id number] Medical Record [name], RN, [name], [id number] Treating RN: Number: [place] 21-Apr-1981 (33 y.o. 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 Facility Procedures CPT4 Code: 78295621 Description: 30865 - WOUND CARE VISIT-LEV 5 EST PT Modifier: Quantity: 1 Physician Procedures CPT4: Description Modifier Quantity Code [id number] [id number] - WC PHYS LEVEL 3 - EST PT 1 ICD-10 Description Diagnosis E10.621 Type 1 diabetes mellitus with foot ulcer 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

## 2015-06-17 ENCOUNTER — Encounter: Payer: Medicare Other | Admitting: Surgery

## 2015-06-18 ENCOUNTER — Encounter: Payer: Medicare Other | Admitting: Surgery

## 2015-06-18 DIAGNOSIS — L97429 Non-pressure chronic ulcer of left heel and midfoot with unspecified severity: Secondary | ICD-10-CM | POA: Diagnosis not present

## 2015-06-18 LAB — GLUCOSE, CAPILLARY: GLUCOSE-CAPILLARY: 324 mg/dL — AB (ref 65–99)

## 2015-06-18 NOTE — Progress Notes (Signed)
[name] ([id number]) Visit Report for 06/18/2015 Arrival Information Details [name], [name] 06/18/2015 1:00 Patient Name: Date of Service: N. PM Medical Record Patient Account Number: [id number] Number: Treating RN: Date of Birth/Sex: 1980/12/07 (33 y.o. Female) Other Clinician: Izetta [name] Primary Care Physician: [name] [name] Referring Physician: [name] Physician/Extender: Tania Ade in Treatment: 11 Visit Information History Since Last Visit Added or deleted any medications: No Patient Arrived: Ambulatory Any new allergies or adverse reactions: No Arrival Time: 12:42 Had a fall or experienced change in No Accompanied By: mother activities of daily living that may affect Transfer Assistance: None risk of falls: Patient Identification Verified: Yes Signs or symptoms of abuse/neglect No Secondary Verification Yes since last visito Process Completed: Hospitalized since last visit: No Patient Requires No Has Dressing in Place as Prescribed: Yes Transmission-Based Has Footwear/Offloading in Place as Yes Precautions: Prescribed: Patient Has Alerts: Yes Left: Surgical Shoe with Patient Alerts: ABI.. bilateral Pressure Relief >247mmH Insole Right: Surgical Shoe with Pressure Relief Insole Pain Present Now: No Electronic Signature(s) Signed: 06/18/2015 3:20:34 PM By: Dayton Martes RCP, RRT, CHT Entered By: Dayton Martes on 06/18/2015 12:58:07 [name] ([id number]) -------------------------------------------------------------------------------- Encounter Discharge Information Details [name] 06/18/2015 1:00 Patient Name: Date of Service: N. PM Medical Record Patient Account Number: [id number] Number: Treating RN: Date of Birth/Sex: 1981/03/12 (33 y.o. Female) Other Clinician: Izetta [name] Primary Care Physician: [name] [name] Referring Physician: [name] Physician/Extender: Tania Ade in Treatment: 11 Encounter Discharge Information Items Discharge Pain Level: 0 Discharge Condition: Stable Ambulatory Status: Ambulatory Discharge Destination: Home Private Transportation: Auto Accompanied By: mother Schedule Follow-up Appointment: No Medication Reconciliation completed and No provided to Patient/Care Provider: Clinical Summary of Care: Notes Patient has an HBO treatment scheduled on 06/19/15 at 13:00 pm. Electronic Signature(s) Signed: 06/18/2015 3:20:34 PM By: Dayton Martes RCP, RRT, CHT Entered By: Dayton Martes on 06/18/2015 14:32:23 [name] ([id number]) -------------------------------------------------------------------------------- Vitals Details [name], [name] 06/18/2015 1:00 Patient Name: Date of Service: N. PM Medical Record Patient Account Number: [id number] Number: Treating RN: Date of Birth/Sex: [birth year] (33 y.o. Female) Other Clinician: Izetta [name] Primary Care Physician: [name] [name] Referring Physician: [name] Physician/Extender: Tania Ade in Treatment: 11 Vital Signs Time Taken: 12:42 Temperature (F): 97.8 Height (in): 68 Pulse (bpm): 90 Weight (lbs): 122 Respiratory Rate (breaths/min): 18 Body Mass Index (BMI): 18.5 Blood Pressure (mmHg): 154/96 Capillary Blood Glucose (mg/dl): 401 Reference Range: 80 - 120 mg / dl Electronic Signature(s) Signed: 06/18/2015 3:20:34 PM By: Dayton Martes RCP, RRT, CHT Entered By: Dayton Martes on 06/18/2015 13:00:28

## 2015-06-18 NOTE — Progress Notes (Signed)
[name], [name] ([id number]) Visit Report for 06/18/2015 HBO Details [name], [name] 06/18/2015 1:00 Patient Name: Date of Service: N. PM Medical Record Patient Account Number: [id number] Number: Treating RN: Date of Birth/Sex: December 18, 1980 (34 y.o. Female) Other Clinician: Izetta Dakin Primary Care Physician: [name] Treating [name] Referring Physician: [name] Physician/Extender: Tania Ade in Treatment: 11 HBO Treatment Course Details Treatment Course Ordering Physician: [name] 1 Number: HBO Treatment Start Date: 06/04/2015 Total Treatments 40 Ordered: HBO Indication: Other (specify in Notes) HBO Treatment Details Treatment Number: 7 Patient Type: Outpatient Chamber Type: Monoplace Chamber #: HBO #[id number] Treatment Protocol: 2.0 ATA with 90 minutes oxygen, and no air breaks Treatment Details Compression Rate Down: 1.5 psi / minute De-Compression Rate Up: 1.5 psi / minute Air breaks and breathing Compress Tx Pressure Decompress Decompress periods Begins Reached Begins Ends (leave unused spaces blank) Chamber Pressure 1 ATA 2.0 ATA - - - - - - 2.0 ATA 1 ATA Clock Time (24 hr) 12:55 13:05 - - - - - - 13:48 13:58 Treatment Length: 63 (minutes) Treatment Segments: 2 Capillary Blood Glucose Pre Capillary Blood Glucose (mg/dl): Post Capillary Blood Glucose (mg/dl): Vital Signs Capillary Blood Glucose Reference Range: 80 - 120 mg / dl HBO Diabetic Blood Glucose Intervention Range: <131 mg/dl or >950 mg/dl Capillary Time Pulse Blood Respiratory Blood Glucose Action Type: Vitals Pulse: Temperature: Oximetry Pressure: Rate: Glucose Meter #: Taken: Taken: (%) (mg/dl): Pre 93:26 712/45 90 18 97.8 324 1 none patient requested no retake of blood Post 15:00 167/109 86 18 97.6 sugar Dicostanzo, Tanya N. ([id number]) Treatment Response Treatment Completion Status: Treatment Aborted/Not Restarted Reason: Patient  Choice Treatment Notes Patient complained of stomach problems at the 48 minute mark of her treatment and stated she needed to come out of the chamber. The treatment was aborted at patient request as she had to go to the bathroom. HBO Attestation I certify that I supervised this HBO treatment in accordance with Medicare guidelines. A trained Yes emergency response team is readily available per hospital policies and procedures. Continue HBOT as ordered. Yes Electronic Signature(s) Signed: 06/18/2015 3:05:39 PM By: [name] MD, FACS Previous Signature: 06/18/2015 1:13:39 PM Version By: [name] MD, FACS Entered By: [name] on 06/18/2015 15:05:39 [name] ([id number]) -------------------------------------------------------------------------------- HBO Safety Checklist Details [name], [name] 06/18/2015 1:00 Patient Name: Date of Service: N. PM Medical Record Patient Account Number: [id number] Number: Treating RN: Date of Birth/Sex: 1981-04-21 (34 y.o. Female) Other Clinician: Izetta Dakin Primary Care Physician: [name] Treating [name] Referring Physician: [name] Physician/Extender: Tania Ade in Treatment: 11 HBO Safety Checklist Items Safety Checklist Consent Form Signed Patient voided / foley secured and emptied When did you last eato 19:00 pm Last dose of injectable or oral agent 19:00 pm NA Ostomy pouch emptied and vented if applicable NA All implantable devices assessed, documented and approved Intravenous access site secured and place Valuables secured Linens and cotton and cotton/polyester blend (less than 51% polyester) Personal oil-based products / skin lotions / body lotions removed Wigs or hairpieces removed Smoking or tobacco materials removed Books / newspapers / magazines / loose paper removed Cologne, aftershave, perfume and deodorant removed Jewelry removed (may wrap wedding band) Make-up  removed Hair care products removed Battery operated devices (external) removed Heating patches and chemical warmers removed NA Titanium eyewear removed Nail polish cured greater than 10 hours NA Casting material cured greater than 10 hours NA Hearing aids removed NA Loose dentures or partials removed NA Prosthetics have been  removed Patient demonstrates correct use of air break device (if applicable) Patient concerns have been addressed Patient grounding bracelet on and cord attached to chamber Specifics for Inpatients (complete in addition to above) Medication sheet sent with patient [name], [name] ([id number]) Intravenous medications needed or due during therapy sent with patient Drainage tubes (e.g. nasogastric tube or chest tube secured and vented) Endotracheal or Tracheotomy tube secured Cuff deflated of air and inflated with saline Airway suctioned Electronic Signature(s) Signed: 06/18/2015 3:20:34 PM By: Dayton Martes RCP, RRT, CHT Entered By: Dayton Martes on 06/18/2015 13:01:30

## 2015-06-19 ENCOUNTER — Encounter: Payer: Medicare Other | Admitting: Surgery

## 2015-06-20 ENCOUNTER — Encounter: Payer: Medicare Other | Admitting: Surgery

## 2015-06-20 DIAGNOSIS — L97429 Non-pressure chronic ulcer of left heel and midfoot with unspecified severity: Secondary | ICD-10-CM | POA: Diagnosis not present

## 2015-06-20 LAB — GLUCOSE, CAPILLARY
GLUCOSE-CAPILLARY: 226 mg/dL — AB (ref 65–99)
Glucose-Capillary: 207 mg/dL — ABNORMAL HIGH (ref 65–99)

## 2015-06-21 ENCOUNTER — Encounter: Payer: Medicare Other | Admitting: Surgery

## 2015-06-21 NOTE — Progress Notes (Addendum)
KRYSTOL, MONJARAS ([id number]) Visit Report for 06/20/2015 Chief Complaint Document Details [name], [name] 06/20/2015 3:15 Patient Name: Date of Service: N. PM Medical Record Patient Account Number: [id number] Number: Treating RN: [name] Date of Birth/Sex: [birth year] (34 y.o. Female) Other Clinician: Primary Care Physician: [name] Treating [name] Referring Physician: [name] Physician/Extender: Tania Ade in Treatment: 12 Information Obtained from: Patient Chief Complaint The patient is back today with 3 weeks treatment at [facility] where she is had several procedures done and was found to ultimately have a dry gangrene of the left forefoot. Electronic Signature(s) Signed: 06/20/2015 3:34:07 PM By: [name] MD, FACS Entered By: [name] on 06/20/2015 15:34:07 [name] ([id number]) -------------------------------------------------------------------------------- Debridement Details YALEXA, HANDELMAN 06/20/2015 3:15 Patient Name: Date of Service: N. PM Medical Record Patient Account Number: [id number] Number: Treating RN: [name] Date of Birth/Sex: 1981-02-11 (34 y.o. Female) Other Clinician: Primary Care Physician: [name] Treating [name] Referring Physician: [name] Physician/Extender: Tania Ade in Treatment: 12 Debridement Performed for Wound #1 Right,Distal Calcaneous Assessment: Performed By: Physician Tristan Schroeder., MD Debridement: Debridement Pre-procedure Yes Verification/Time Out Taken: Start Time: 03:15 Pain Control: Lidocaine 5% topical ointment Level: Skin/Subcutaneous Tissue Total Area Debrided (L x 2 (cm) x 3 (cm) = 6 (cm) W): Tissue and other Viable, Non-Viable, Eschar, Fibrin/Slough, Subcutaneous material debrided: Instrument: Curette, Nippers Bleeding: Minimum Hemostasis Achieved: Pressure End Time: 03:18 Procedural Pain: Insensate Post Procedural Pain:  0 Response to Treatment: Procedure was tolerated well Post Debridement Measurements of Total Wound Length: (cm) 2 Width: (cm) 3 Depth: (cm) 0.2 Volume: (cm) 0.942 Post Procedure Diagnosis Same as Pre-procedure Electronic Signature(s) Signed: 06/20/2015 3:33:59 PM By: [name] MD, FACS Signed: 06/20/2015 4:22:28 PM By: [name] Entered By: [name] on 06/20/2015 15:33:59 [name] ([id number]) -------------------------------------------------------------------------------- HPI Details MAGENTA, KRAUTER 06/20/2015 3:15 Patient Name: Date of Service: N. PM Medical Record Patient Account Number: [id number] Number: Treating RN: [name] Date of Birth/Sex: Jun 20, 1981 (34 y.o. Female) Other Clinician: Primary Care Physician: [name] Treating [name] Referring Physician: [name] Physician/Extender: Tania Ade in Treatment: 12 History of Present Illness Location: right heel ulcer Quality: Patient reports No Pain. Severity: Patient states wound are getting worse. Duration: Patient has had the wound for > 2 months prior to seeking treatment at the wound center Context: The wound occurred when the patient injured her foot on something sharp on her deck. Modifying Factors: Consults to this date include:antibiotics which included Keflex in about a month ago and now at present time she is on clindamycin. Associated Signs and Symptoms: Patient reports having difficulty standing for long periods. HPI Description: Jalanda Spriggs is a 34 y.o. female who presents to our wound center referred by her PCP Dr. Zada Finders for nonhealing ulcers on the lateral aspect of the right heel. The patient reports about 10 weeks ago she suffered an injury of the right heel. The wounds got infected, she was given Cephalexin and the use of Bactroban. Of note she has a history of type 1 diabetes mellitus that has been uncontrolled. Past medical history  significant for type 1 diabetes mellitus not controlled, ankylosing spondylitis, anorexia nervosa, irritable bowel syndrome, chronic kidney disease, chronic diarrhea. She has been treated by her PCP with duoderm which is to be changed every 3 days. Her last hemoglobin A1c in March was 11. 05/08/2015 -- the patient was seen here once on 03/28/2015 and then once in 04/17/2015. He has been very irregular with her appointments for various  reasons. She says about 2 weeks ago she was in a swimming pool and she injured her left big toe and had a blister which she started picking at. She says she's been applying some allergen over this. She's noticed that her toes on this foot and the skin over the dorsum of forefoot have turned red. She does not have any fever or any other significant signs of infection. Addendum: after seeing the patient I have personally spoken to her PCP Dr. [name] and discussed the recent cellulitis she's developed and recommended that she get IV antibiotics and inpatient care for a few days. 05/30/2015 -- the patient is here today with her mother and after her last visit on 8/10 she was seen in the ER at [facility] and they started her on oral antibiotics and got worked up with vascular surgery. 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 She was asked to follow- up with the wound center today for starting of hyperbaric oxygen therapy as soon as possible. 06/07/2015 -- after the initial treatment of hyperbaric oxygen therapy she started having ear problems and had ultimately to use myringotomy tubes and this was done today. Since then she's been doing fine. 06/20/2015 -- she has seen several surgeons over the last week and has a few more appointments this coming week to see vascular and hand surgeons. Her fingers are very cold and  she's got several tips of her fingers which are now gangrene is with dry gangrene. The right great toe also has a patch of gangrene medially. She is also retaining a lot of fluid and hence is doing dialysis more often. ORVETTA, DANIELSKI ([id number]) Electronic Signature(s) Signed: 06/20/2015 3:35:08 PM By: [name] MD, FACS Entered By: [name] on 06/20/2015 15:35:08 [name] ([id number]) -------------------------------------------------------------------------------- Physical Exam Details OTELIA, HETTINGER 06/20/2015 3:15 Patient Name: Date of Service: N. PM Medical Record Patient Account Number: [id number] Number: Treating RN: [name] Date of Birth/Sex: 02-Jun-1981 (33 y.o. Female) Other Clinician: Primary Care Physician: [name] Treating [name] Referring Physician: [name] Physician/Extender: Tania Ade in Treatment: 12 Constitutional . Pulse regular. Respirations normal and unlabored. Afebrile. . Eyes Nonicteric. Reactive to light. Ears, Nose, Mouth, and Throat Lips, teeth, and gums WNL.Marland Kitchen Moist mucosa without lesions . Neck supple and nontender. No palpable supraclavicular or cervical adenopathy. Normal sized without goiter. Respiratory WNL. No retractions.. Cardiovascular Pedal Pulses WNL. No clubbing, cyanosis or edema. Lymphatic No adneopathy. No adenopathy. No adenopathy. Musculoskeletal Adexa without tenderness or enlargement.. Digits and nails w/o clubbing, cyanosis, infection, petechiae, ischemia, or inflammatory conditions.. Integumentary (Hair, Skin) No suspicious lesions. No crepitus or fluctuance. No peri-wound warmth or erythema. No masses.Marland Kitchen Psychiatric Judgement and insight Intact.. No evidence of depression, anxiety, or agitation.. Notes The dry gangrene on the first toe of the left foot is persistent but not spreading. She has got a new patch of dry gangrene on the medial part of her right big toe.  The right calcaneum has minimal slough which will be sharply debrided and the surrounding area looks healthy. Electronic Signature(s) Signed: 06/20/2015 3:36:12 PM By: [name] MD, FACS Entered By: [name] on 06/20/2015 15:36:12 [name] ([id number]) -------------------------------------------------------------------------------- Physician Orders Details KAMEE, BOBST 06/20/2015 3:15 Patient Name: Date of Service: N. PM Medical Record Patient Account Number: [id number] Number: Treating RN: Huel Coventry  Date of Birth/Sex: 02-11-1981 (33 y.o. 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 Wound #3 Left,Plantar,Circumferential Toe Great o Cleanse wound with mild soap and water o May Shower, gently pat wound dry prior to applying new dressing. Wound #5 Right,Lateral Toe Great o Cleanse wound with mild soap and water o May Shower, gently pat wound dry prior to applying new dressing. Wound #7 Left,Plantar Foot o Cleanse wound with mild soap and water o May Shower, gently pat wound dry prior to applying new  dressing. Wound #8 Left Toe Second o Cleanse wound with mild soap and water o May Shower, gently pat wound dry prior to applying new dressing. Anesthetic Wound #1 Right,Distal Calcaneous o Topical Lidocaine 4% cream applied to wound bed prior to debridement Waldman, Renette N. ([id number]) Wound #3 Left,Plantar,Circumferential Toe Great o Topical Lidocaine 4% cream applied to wound bed prior to debridement Wound #5 Right,Lateral Toe Great o Topical Lidocaine 4% cream applied to wound bed prior to debridement Wound #7 Left,Plantar Foot o Topical Lidocaine 4% cream applied to wound bed prior to debridement Wound #8 Left Toe Second o Topical Lidocaine 4% cream applied to wound bed prior to debridement Primary Wound Dressing Wound #1 Right,Distal Calcaneous o Aquacel Ag Wound #3 Left,Plantar,Circumferential Toe Great o Other: - betadine paint Wound #5 Right,Lateral Toe Great o Aquacel Ag Wound #7 Left,Plantar Foot o Aquacel Ag Secondary Dressing Wound #1 Right,Distal Calcaneous o Boardered Foam Dressing Wound #3 Left,Plantar,Circumferential Toe Great o Conform/Kerlix Wound #5 Right,Lateral Toe Great o Boardered Foam Dressing Wound #7 Left,Plantar Foot o Boardered Foam Dressing Dressing Change Frequency Wound #1 Right,Distal Calcaneous o Change dressing every other day. Wound #3 Left,Plantar,Circumferential Toe Great o Change dressing every other day. Wound #5 Right,Lateral Toe Great o Change dressing every other day. DOLORES, EWING ([id number]) Wound #7 Left,Plantar Foot o Change dressing every other day. Wound #8 Left Toe Second o Change dressing every other day. Follow-up Appointments Wound #1 Right,Distal Calcaneous o Return Appointment in 1 week. Wound #3 Left,Plantar,Circumferential Toe Great o Return Appointment in 1 week. Wound #5 Right,Lateral Toe Great o Return Appointment in 1 week. Wound #7 Left,Plantar  Foot o Return Appointment in 1 week. Wound #8 Left Toe Second o Return Appointment in 1 week. Off-Loading Wound #1 Right,Distal Calcaneous o Other: - darco with peg assist Wound #3 Left,Plantar,Circumferential Toe Great o Other: - darco with peg assist Wound #5 Right,Lateral Toe Great o Other: - darco with peg assist Wound #7 Left,Plantar Foot o Other: - darco with peg assist Wound #8 Left Toe Second o Other: - darco with peg assist Electronic Signature(s) Signed: 06/20/2015 3:44:09 PM By: [name] MD, FACS Signed: 06/20/2015 6:06:48 PM By: Elliot Gurney RN, [name], Kim RN, [name] Entered By: Elliot Gurney, RN, [name], Kim on 06/20/2015 15:40:00 Metzner,  Ledell Peoples ([id number]) -------------------------------------------------------------------------------- Problem List Details FOREVER, FUENTE 06/20/2015 3:15 Patient Name: Date of Service: N. PM Medical Record Patient Account Number: [id number] Number: Treating RN: [name] Date of Birth/Sex: 06/21/1981 (33 y.o. 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 PM Medical Record Patient Account Number: [id number] Number: Treating RN: [name] Date of Birth/Sex: April 29, 1981 (33 y.o. Female) Other Clinician: Primary Care Physician: [name] Treating [name] Referring Physician: [name] Physician/Extender: Tania Ade in Treatment: 12 Subjective Chief Complaint Information obtained from Patient The patient is back today with 3 weeks treatment at [facility] where she is had several procedures done and was found to ultimately have a dry gangrene of the left forefoot. History of Present Illness (HPI) The following HPI elements were documented for the patient's wound: Location: right heel ulcer Quality: Patient reports No Pain. Severity: Patient states wound are getting worse. Duration: Patient has had the wound for > 2 months prior to seeking treatment at the wound center Context: The wound occurred when the patient injured her foot on something sharp on her deck. Modifying Factors: Consults to this date include:antibiotics which included Keflex in about a month ago and now at present time she is on clindamycin. Associated Signs and Symptoms: Patient reports having difficulty standing for long periods. Genelle Toller is a 34 y.o. female who presents to our wound center referred by her PCP Dr. Zada Finders for nonhealing ulcers on the lateral aspect of the right heel. The patient reports about 10 weeks ago she suffered an injury of the right heel. The wounds got infected, she was given Cephalexin and the use of Bactroban. Of note she has a history of type 1 diabetes mellitus that has been uncontrolled. Past medical history significant for type 1 diabetes mellitus not controlled, ankylosing spondylitis,  anorexia nervosa, irritable bowel syndrome, chronic kidney disease, chronic diarrhea. She has been treated by her PCP with duoderm which is to be changed every 3 days. Her last hemoglobin A1c in March was 11. 05/08/2015 -- the patient was seen here once on 03/28/2015 and then once in 04/17/2015. He has been very irregular with her appointments for various reasons. She says about 2 weeks ago she was in a swimming pool and she injured her left big toe and had a blister which she started picking at. She says she's been applying some allergen over this. She's noticed that her toes on this foot and the skin over the dorsum of forefoot have turned red. She does not have any fever or any other significant signs of infection. Addendum: after seeing the patient I have personally spoken to her PCP Dr. [name] and discussed the recent cellulitis she's developed  and recommended that she get IV antibiotics and inpatient care for a few days. 05/30/2015 -- the patient is here today with her mother and after her last visit on 8/10 she was seen in the ER at [facility] and they started her on oral antibiotics and got worked up with vascular surgery. I understand over the last 3 weeks she's had several procedures done and a possible angioplasty Kobus, Patrizia N. 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 She was asked to follow- up with the wound center today for starting of hyperbaric oxygen therapy as soon as possible. 06/07/2015 -- after the initial treatment of hyperbaric oxygen therapy she started having ear problems and had ultimately to use myringotomy tubes and this was done today. Since then she's been doing fine. 06/20/2015 -- she has seen several surgeons over the last week and has a few more appointments this coming week to see vascular and hand surgeons. Her fingers are very cold and she's got several tips of her fingers which are  now gangrene is with dry gangrene. The right great toe also has a patch of gangrene medially. She is also retaining a lot of fluid and hence is doing dialysis more often. Objective Constitutional Pulse regular. Respirations normal and unlabored. Afebrile. Vitals Time Taken: 2:58 PM, Height: 68 in, Weight: 122 lbs, BMI: 18.5, Temperature: 97.6 F, Pulse: 89 bpm, Respiratory Rate: 18 breaths/min, Blood Pressure: 188/121 mmHg, Capillary Blood Glucose: 207 mg/dl. Eyes Nonicteric. Reactive to light. Ears, Nose, Mouth, and Throat Lips, teeth, and gums WNL.Marland Kitchen Moist mucosa without lesions . Neck supple and nontender. No palpable supraclavicular or cervical adenopathy. Normal sized without goiter. Respiratory WNL. No retractions.. Cardiovascular Pedal Pulses WNL. No clubbing, cyanosis or edema. Lymphatic No adneopathy. No adenopathy. No adenopathy. Musculoskeletal Adexa without tenderness or enlargement.. Digits and nails w/o clubbing, cyanosis, infection, petechiae, ischemia, or inflammatory conditions.Marland Kitchen Psychiatric Judgement and insight Intact.. No evidence of depression, anxiety, or agitation.Marland Kitchen MEENA, BARRANTES ([id number]) General Notes: The dry gangrene on the first toe of the left foot is persistent but not spreading. She has got a new patch of dry gangrene on the medial part of her right big toe. The right calcaneum has minimal slough which will be sharply debrided and the surrounding area looks healthy. Integumentary (Hair, Skin) No suspicious lesions. No crepitus or fluctuance. No peri-wound warmth or erythema. No masses.. Wound #1 status is Open. Original cause of wound was Gradually Appeared. The wound is located on the Right,Distal Calcaneous. The wound measures 2cm length x 3cm width x 0.1cm depth; 4.712cm^2 area and 0.471cm^3 volume. Wound #3 status is Open. Original cause of wound was Shear/Friction. The wound is located on the Left,Plantar,Circumferential Toe Great. The  wound measures 6.5cm length x 7cm width x 0.1cm depth; 35.736cm^2 area and 3.574cm^3 volume. Wound #5 status is Open. Original cause of wound was Gradually Appeared. The wound is located on the [employer]. The wound measures 0.8cm length x 0.6cm width x 0.1cm depth; 0.377cm^2 area and 0.038cm^3 volume. Wound #7 status is Open. Original cause of wound was Gradually Appeared. The wound is located on the Left,Plantar Foot. The wound measures 0.4cm length x 0.7cm width x 0.1cm depth; 0.22cm^2 area and 0.022cm^3 volume. Wound #8 status is Open. Original cause of wound was Gradually Appeared. The wound is located on the Left Toe Second. The wound measures 2.5cm length x 3cm width x 0.1cm depth; 5.89cm^2 area and  0.589cm^3 volume. Assessment Active Problems ICD-10 E10.621 - Type 1 diabetes mellitus with foot ulcer L97.412 - Non-pressure chronic ulcer of right heel and midfoot with fat layer exposed L97.429 - Non-pressure chronic ulcer of left heel and midfoot with unspecified severity L03.116 - Cellulitis of left lower limb I70.245 - Atherosclerosis of native arteries of left leg with ulceration of other part of foot E10.52 - Type 1 diabetes mellitus with diabetic peripheral angiopathy with gangrene This unfortunate young lady has progressive vascular problems both fingers and toes and is seeing the appropriate consultants. We will continue with local wound care and hyperbaric oxygen therapy as this is of benefit to her. Other than that her diabetes is under control and her nutrition is adequate. She continues to [place], MADALINE LEFEBER. ([id number]) be a bit depressed but is coping fairly well with her busy schedule of multiple appointments with physicians and treatments. Procedures Wound #1 Wound #1 is a Diabetic Wound/Ulcer of the Lower Extremity located on the Right,Distal Calcaneous . There was a Skin/Subcutaneous Tissue Debridement ([id number]) debridement with total area of 6  sq cm performed by Britto, Ignacia Felling., MD. with the following instrument(s): Curette and Nippers to remove Viable and Non-Viable tissue/material including Fibrin/Slough, Eschar, and Subcutaneous after achieving pain control using Lidocaine 5% topical ointment. A time out was conducted prior to the start of the procedure. A Minimum amount of bleeding was controlled with Pressure. The procedure was tolerated well with a pain level of Insensate throughout and a pain level of 0 following the procedure. Post Debridement Measurements: 2cm length x 3cm width x 0.2cm depth; 0.942cm^3 volume. Post procedure Diagnosis Wound #1: Same as Pre-Procedure Plan Wound Cleansing: Wound #1 Right,Distal Calcaneous: Cleanse wound with mild soap and water May Shower, gently pat wound dry prior to applying new dressing. Wound #3 Left,Plantar,Circumferential Toe Great: Cleanse wound with mild soap and water May Shower, gently pat wound dry prior to applying new dressing. Wound #5 Right,Lateral Toe Great: Cleanse wound with mild soap and water May Shower, gently pat wound dry prior to applying new dressing. Wound #7 Left,Plantar Foot: Cleanse wound with mild soap and water May Shower, gently pat wound dry prior to applying new dressing. Wound #8 Left Toe Second: Cleanse wound with mild soap and water May Shower, gently pat wound dry prior to applying new dressing. Anesthetic: Wound #1 Right,Distal Calcaneous: Topical Lidocaine 4% cream applied to wound bed prior to debridement Wound #3 Left,Plantar,Circumferential Toe Great: Topical Lidocaine 4% cream applied to wound bed prior to debridement Wound #5 Right,Lateral Toe Great: Topical Lidocaine 4% cream applied to wound bed prior to debridement Wound #7 Left,Plantar Foot: Topical Lidocaine 4% cream applied to wound bed prior to debridement Wound #8 Left Toe SecondMARIALIZ, FERREBEE ([id number]) Topical Lidocaine 4% cream applied to wound bed prior to  debridement Primary Wound Dressing: Wound #1 Right,Distal Calcaneous: Aquacel Ag Wound #3 Left,Plantar,Circumferential Toe Great: Other: - betadine paint Wound #5 Right,Lateral Toe Great: Aquacel Ag Wound #7 Left,Plantar Foot: Aquacel Ag Secondary Dressing: Wound #1 Right,Distal Calcaneous: Boardered Foam Dressing Wound #3 Left,Plantar,Circumferential Toe Great: Conform/Kerlix Wound #5 Right,Lateral Toe Great: Boardered Foam Dressing Wound #7 Left,Plantar Foot: Boardered Foam Dressing Dressing Change Frequency: Wound #1 Right,Distal Calcaneous: Change dressing every other day. Wound #3 Left,Plantar,Circumferential Toe Great: Change dressing every other day. Wound #5 Right,Lateral Toe Great: Change dressing every other day. Wound #7 Left,Plantar Foot: Change dressing every other day. Wound #8 Left Toe Second: Change dressing every other day. Follow-up Appointments: Wound #1  Right,Distal Calcaneous: Return Appointment in 1 week. Wound #3 Left,Plantar,Circumferential Toe Great: Return Appointment in 1 week. Wound #5 Right,Lateral Toe Great: Return Appointment in 1 week. Wound #7 Left,Plantar Foot: Return Appointment in 1 week. Wound #8 Left Toe Second: Return Appointment in 1 week. Off-Loading: Wound #1 Right,Distal Calcaneous: Other: - darco with peg assist Wound #3 Left,Plantar,Circumferential Toe Great: Other: - darco with peg assist Wound #5 Right,Lateral Toe Great: Other: - darco with peg assist Wound #7 Left,Plantar Foot: Other: - darco with peg assist Wound #8 Left Toe SecondMICAILA, ZIEMBA ([id number]) Other: - darco with peg assist This unfortunate young lady has progressive vascular problems both fingers and toes and is seeing the appropriate consultants. We will continue with local wound care and hyperbaric oxygen therapy as this is of benefit to her. Other than that her diabetes is under control and her nutrition is adequate. She continues to be  a bit depressed but is coping fairly well with her busy schedule of multiple appointments with physicians and treatments. Electronic Signature(s) Signed: 06/20/2015 3:48:42 PM By: [name] MD, FACS Previous Signature: 06/20/2015 3:38:03 PM Version By: [name] MD, FACS Entered By: [name] on 06/20/2015 15:48:42 [name] ([id number]) -------------------------------------------------------------------------------- SuperBill Details Patient Name: [name] Date of Service: 06/20/2015 Medical Record Number: [id number] Patient Account Number: [id number] Date of Birth/Sex: [birth year] (34 y.o. 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 Facility Procedures CPT4: Description Modifier Quantity Code [id number] 11042 - DEB SUBQ TISSUE 20 SQ CM/< 1 ICD-10 Description Diagnosis E10.621 Type 1 diabetes mellitus with foot ulcer 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 Physician Procedures CPT4: Description Modifier Quantity Code [id number] 11042 - WC PHYS SUBQ TISS 20 SQ CM 1 ICD-10 Description Diagnosis E10.621 Type 1 diabetes mellitus with foot  ulcer 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

## 2015-06-21 NOTE — Progress Notes (Signed)
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 Thickness Periwound Skin Texture Texture Color No Abnormalities Noted: No No Abnormalities Noted: No Moisture No Abnormalities Noted: No Treatment Notes Wound #7 (Left, Plantar Foot) 1. Cleansed with: [name], [name] ([id number]) Clean wound with Normal Saline 4. Dressing Applied: Aquacel Ag 5. Secondary Dressing Applied Gauze and Kerlix/Conform Notes no tape on skin Electronic Signature(s) Signed: 06/20/2015 6:06:48 PM By: Elliot Gurney, RN, [name], Kim RN, [name] Entered By: Elliot Gurney, RN, [name], Kim on  06/20/2015 15:13:39 [name] ([id number]) -------------------------------------------------------------------------------- Wound Assessment Details Patient Name: [name] Date of Service: 06/20/2015 3:15 PM Medical Record Number: [id number] Patient Account Number: [id number] Date of Birth/Sex: 1981/08/02 (34 y.o. Female) Treating RN: Huel Coventry Primary Care Physician: [name] Other Clinician: Referring Physician: [name] Treating Physician/Extender: Rudene Re in Treatment: 12 Wound Status Wound Number: 8 Primary Diabetic Wound/Ulcer of the Lower Etiology: Extremity Wound Location: Left Toe Second Wound Status: Open Wounding Event: Gradually Appeared Date Acquired: 06/05/2015 Weeks Of Treatment: 1 Clustered Wound: No Photos Photo Uploaded By: Elliot Gurney, RN, [name], Kim on 06/20/2015 16:01:55 Wound Measurements Length: (cm) 2.5 Width: (cm) 3 Depth: (cm) 0.1 Area: (cm) 5.89 Volume: (cm) 0.589 % Reduction in Area: -36.3% % Reduction in Volume: -36.3% Periwound Skin Texture Texture Color No Abnormalities Noted: No No Abnormalities Noted: No Moisture No Abnormalities Noted: No Treatment Notes Wound #8 (Left Toe Second) 1. Cleansed with: Clean wound with Normal Saline 4. Dressing Applied: [name], [name] ([id number]) Other dressing (specify in notes) 5. Secondary Dressing Applied Gauze and Kerlix/Conform Notes betadine; sretch net Electronic Signature(s) Signed: 06/20/2015  6:06:48 PM By: Elliot Gurney, RN, [name], Kim RN, [name] Entered By: Elliot Gurney, RN, [name], Kim on 06/20/2015 15:13:39 [name] ([id number]) -------------------------------------------------------------------------------- Vitals Details Patient Name: [name] Date of Service: 06/20/2015 3:15 PM Medical Record Number: [id number] Patient Account Number: [id number] Date of Birth/Sex: 1981-09-13 (34 y.o. Female) Treating RN: [name] Primary Care  Physician: [name] Other Clinician: Referring Physician: [name] Treating Physician/Extender: Rudene Re in Treatment: 12 Vital Signs Time Taken: 14:58 Temperature (F): 97.6 Height (in): 68 Pulse (bpm): 89 Weight (lbs): 122 Respiratory Rate (breaths/min): 18 Body Mass Index (BMI): 18.5 Blood Pressure (mmHg): 188/121 Capillary Blood Glucose (mg/dl): 782 Reference Range: 80 - 120 mg / dl Electronic Signature(s) Signed: 06/20/2015 4:17:55 PM By: Dayton Martes RCP, RRT, CHT Entered By: Dayton Martes on 06/20/2015 15:17:35

## 2015-06-21 NOTE — Progress Notes (Signed)
[name], [name] ([id number]) Visit Report for 06/20/2015 HBO Details [name], [name] 06/20/2015 1:00 Patient Name: Date of Service: N. PM Medical Record Patient Account Number: [id number] Number: Treating RN: Date of Birth/Sex: 1981-05-25 (33 y.o. Female) Other Clinician: Izetta Wu Primary Care Physician: [name] [name] Referring Physician: Rolin Wu Physician/Extender: [name] in Treatment: 12 HBO Treatment Course Details Treatment Course Ordering Physician: [name] 1 Number: HBO Treatment Start Date: 06/04/2015 Total Treatments 40 Ordered: HBO Indication: Other (specify in Notes) HBO Treatment Details Treatment Number: 8 Patient Type: Outpatient Chamber Type: Monoplace Chamber #: HBO #[id number] Treatment Protocol: 2.0 ATA with 90 minutes oxygen, and no air breaks Treatment Details Compression Rate Down: 1.5 psi / minute De-Compression Rate Up: 1.5 psi / minute Air breaks and breathing Compress Tx Pressure Decompress Decompress periods Begins Reached Begins Ends (leave unused spaces blank) Chamber Pressure 1 ATA 2.0 ATA - - - - - - 2.0 ATA 1 ATA Clock Time (24 hr) 12:55 13:05 - - - - - - 14:32 14:42 Treatment Length: 107 (minutes) Treatment Segments: 4 Capillary Blood Glucose Pre Capillary Blood Glucose (mg/dl): Post Capillary Blood Glucose (mg/dl): Vital Signs Capillary Blood Glucose Reference Range: 80 - 120 mg / dl HBO Diabetic Blood Glucose Intervention Range: <131 mg/dl or >235 mg/dl Time Vitals Blood Respiratory Capillary Blood Glucose Pulse Action Type: Pulse: Temperature: Taken: Pressure: Rate: Glucose (mg/dl): Meter #: Oximetry (%) Taken: Pre 12:44 182/105 85 18 97.6 226 1 none Post 14:58 188/121 89 18 97.6 207 1 none Treatment Response [name], [name] Treatment Completion Status: Treatment Completed without Adverse Event Electronic Signature(s) Signed: 06/20/2015 3:39:00 PM  By: [name] Kanner MD, FACS Entered By: [name] on 06/20/2015 15:39:00 [name] ([id number]) -------------------------------------------------------------------------------- HBO Safety Checklist Details [name], [name] 06/20/2015 1:00 Patient Name: Date of Service: N. PM Medical Record Patient Account Number: [id number] Number: Treating RN: Date of Birth/Sex: November 02, 1980 (33 y.o. Female) Other Clinician: Izetta Wu Primary Care Physician: [name] [name] Referring Physician: Rolin Wu Physician/Extender: [name] in Treatment: 12 HBO Safety Checklist Items Safety Checklist Consent Form Signed Patient voided / foley secured and emptied When did you last eato 19:00 pm on 06/19/15 Last dose of injectable or oral agent 19:00 pm on 06/19/15 NA Ostomy pouch emptied and vented if applicable NA All implantable devices assessed, documented and approved Intravenous access site secured and place Valuables secured Linens and cotton and cotton/polyester blend (less than 51% polyester) Personal oil-based products / skin lotions / body lotions removed Wigs or hairpieces removed Smoking or tobacco materials removed Books / newspapers / magazines / loose paper removed Cologne, aftershave, perfume and deodorant removed Jewelry removed (may wrap wedding band) Make-up removed Hair care products removed Battery operated devices (external) removed Heating patches and chemical warmers removed NA Titanium eyewear removed Nail polish cured greater than 10 hours NA Casting material cured greater than 10 hours NA Hearing aids removed NA Loose dentures or partials removed NA Prosthetics have been removed Patient demonstrates correct use of air break device (if applicable) Patient concerns have been addressed Patient grounding bracelet on and cord attached to chamber Specifics for Inpatients (complete in addition to above) Medication sheet  sent with patient [name], [name] ([id number]) Intravenous medications needed or due during therapy sent with patient Drainage tubes (e.g. nasogastric tube or chest tube secured and vented) Endotracheal or Tracheotomy tube secured Cuff deflated of air and inflated with saline Airway suctioned Electronic Signature(s) Signed: 06/20/2015 3:38:51 PM By: [name],  [name] Buff MD, FACS Entered By: [name] on 06/20/2015 15:38:49

## 2015-06-21 NOTE — Progress Notes (Signed)
[name] ([id number]) Visit Report for 06/20/2015 Arrival Information Details [name], [name] 06/20/2015 1:00 Patient Name: Date of Service: N. PM Medical Record Patient Account Number: [id number] Number: Treating RN: Date of Birth/Sex: [birth year] (33 y.o. Female) Other Clinician: Izetta Dakin Primary Care Physician: [name] Treating [name] Referring Physician: [name] Physician/Extender: Tania Ade in Treatment: 12 Visit Information History Since Last Visit Added or deleted any medications: No Patient Arrived: Ambulatory Any new allergies or adverse reactions: No Arrival Time: 12:45 Had a fall or experienced change in No Accompanied By: parents activities of daily living that may affect Transfer Assistance: None risk of falls: Patient Identification Verified: Yes Signs or symptoms of abuse/neglect No Secondary Verification Yes since last visito Process Completed: Hospitalized since last visit: No Patient Requires No Has Dressing in Place as Prescribed: Yes Transmission-Based Has Footwear/Offloading in Place as Yes Precautions: Prescribed: Patient Has Alerts: Yes Left: Surgical Shoe with Patient Alerts: ABI..[place] bilateral Pressure Relief >267mmH Insole Right: Surgical Shoe with Pressure Relief Insole Pain Present Now: No Electronic Signature(s) Signed: 06/20/2015 4:17:55 PM By: Dayton Martes RCP, RRT, CHT Entered By: Dayton Martes on 06/20/2015 13:06:14 [name] ([id number]) -------------------------------------------------------------------------------- Encounter Discharge Information Details [name] 06/20/2015 1:00 Patient Name: Date of Service: N. PM Medical Record Patient Account Number: [id number] Number: Treating RN: Date of Birth/Sex: 12-22-1980 (33 y.o. Female) Other Clinician: Izetta Dakin Primary Care Physician: [name]  Treating [name] Referring Physician: [name] Physician/Extender: Tania Ade in Treatment: 12 Encounter Discharge Information Items Discharge Pain Level: 0 Discharge Condition: Stable Ambulatory Status: Ambulatory Discharge Destination: Home Private Transportation: Auto Accompanied By: mother Schedule Follow-up Appointment: No Medication Reconciliation completed and No provided to Patient/Care [name]: Clinical Summary of Care: Notes Patient has an HBO treatment scheduled on 06/21/15 at 13:00 am. Electronic Signature(s) Signed: 06/20/2015 4:17:55 PM By: Dayton Martes RCP, RRT, CHT Entered By: Dayton Martes on 06/20/2015 15:16:50 [name] ([id number]) -------------------------------------------------------------------------------- Vitals Details [name], [name] 06/20/2015 1:00 Patient Name: Date of Service: N. PM Medical Record Patient Account Number: [id number] Number: Treating RN: Date of Birth/Sex: 06-12-1981 (33 y.o. Female) Other Clinician: Izetta Dakin Primary Care Physician: [name] Treating [name] Referring Physician: [name] Physician/Extender: Tania Ade in Treatment: 12 Vital Signs Time Taken: 12:44 Temperature (F): 97.6 Height (in): 68 Pulse (bpm): 85 Weight (lbs): 122 Respiratory Rate (breaths/min): 18 Body Mass Index (BMI): 18.5 Blood Pressure (mmHg): 182/105 Capillary Blood Glucose (mg/dl): 466 Reference Range: 80 - 120 mg / dl Electronic Signature(s) Signed: 06/20/2015 4:17:55 PM By: Dayton Martes RCP, RRT, CHT Entered By: Weyman Rodney, Lucio Edward on 06/20/2015 13:06:49

## 2015-06-24 ENCOUNTER — Encounter: Payer: Medicare Other | Admitting: Surgery

## 2015-06-25 ENCOUNTER — Encounter: Payer: Medicare Other | Admitting: Surgery

## 2015-06-26 ENCOUNTER — Other Ambulatory Visit: Payer: Self-pay | Admitting: Vascular Surgery

## 2015-06-26 ENCOUNTER — Encounter: Payer: Medicare Other | Admitting: Surgery

## 2015-06-27 ENCOUNTER — Encounter: Admission: RE | Payer: Self-pay | Source: Ambulatory Visit

## 2015-06-27 ENCOUNTER — Ambulatory Visit: Admission: RE | Admit: 2015-06-27 | Payer: Medicare Other | Source: Ambulatory Visit | Admitting: Vascular Surgery

## 2015-06-27 ENCOUNTER — Ambulatory Visit: Payer: Medicare Other | Admitting: Surgery

## 2015-06-27 ENCOUNTER — Encounter: Payer: Medicare Other | Admitting: Surgery

## 2015-06-27 SURGERY — UPPER EXTREMITY ANGIOGRAPHY
Anesthesia: Moderate Sedation | Laterality: Left

## 2015-06-28 ENCOUNTER — Encounter: Payer: Medicare Other | Admitting: Surgery

## 2015-07-01 ENCOUNTER — Encounter: Payer: Medicare Other | Admitting: Surgery

## 2015-07-02 ENCOUNTER — Encounter: Payer: Medicare Other | Admitting: Surgery

## 2015-07-03 ENCOUNTER — Encounter: Payer: Medicare Other | Admitting: Surgery

## 2015-07-04 ENCOUNTER — Encounter: Payer: Medicare Other | Admitting: General Surgery

## 2015-07-04 ENCOUNTER — Ambulatory Visit: Payer: Medicare Other | Admitting: General Surgery

## 2015-07-05 ENCOUNTER — Encounter: Payer: Medicare Other | Admitting: Surgery

## 2015-07-08 ENCOUNTER — Encounter: Payer: Medicare Other | Admitting: Surgery

## 2015-07-08 ENCOUNTER — Encounter: Payer: Medicare Other | Admitting: General Surgery

## 2015-07-09 ENCOUNTER — Encounter: Payer: Medicare Other | Admitting: Surgery

## 2015-07-10 ENCOUNTER — Encounter: Payer: Medicare Other | Admitting: Surgery

## 2015-07-11 ENCOUNTER — Encounter: Payer: Medicare Other | Admitting: Surgery

## 2015-07-11 ENCOUNTER — Ambulatory Visit: Payer: Medicare Other | Admitting: Surgery

## 2015-07-12 ENCOUNTER — Encounter: Payer: Medicare Other | Admitting: Surgery

## 2015-07-12 ENCOUNTER — Ambulatory Visit: Payer: Medicare Other | Admitting: Surgery

## 2015-07-15 ENCOUNTER — Encounter: Payer: Medicare Other | Admitting: Surgery

## 2015-07-16 ENCOUNTER — Encounter: Payer: Medicare Other | Attending: Surgery | Admitting: Surgery

## 2015-07-16 DIAGNOSIS — E1052 Type 1 diabetes mellitus with diabetic peripheral angiopathy with gangrene: Secondary | ICD-10-CM | POA: Insufficient documentation

## 2015-07-16 DIAGNOSIS — E10621 Type 1 diabetes mellitus with foot ulcer: Secondary | ICD-10-CM | POA: Insufficient documentation

## 2015-07-16 DIAGNOSIS — L97412 Non-pressure chronic ulcer of right heel and midfoot with fat layer exposed: Secondary | ICD-10-CM | POA: Insufficient documentation

## 2015-07-16 DIAGNOSIS — L97429 Non-pressure chronic ulcer of left heel and midfoot with unspecified severity: Secondary | ICD-10-CM | POA: Insufficient documentation

## 2015-07-16 DIAGNOSIS — I70245 Atherosclerosis of native arteries of left leg with ulceration of other part of foot: Secondary | ICD-10-CM | POA: Insufficient documentation

## 2015-07-16 LAB — GLUCOSE, CAPILLARY: GLUCOSE-CAPILLARY: 109 mg/dL — AB (ref 65–99)

## 2015-07-16 NOTE — Progress Notes (Signed)
[name], Wu ([id number]) Visit Report for 07/16/2015 Arrival Information Details [name], Wu 07/16/2015 8:00 Patient Name: Date of Service: N. AM Medical Record Patient Account Number: [id number] Number: Treating RN: November 18, 1980 (34 y.o. Other Clinician: Izetta Dakin Date of Birth/Sex: Female) Treating Britto, [name] Primary Care Physician/Extender: [name] Physician: Referring Physician: [name] in Treatment: 15 Visit Information History Since Last Visit Added or deleted any medications: No Patient Arrived: Wheel Chair Any new allergies or adverse reactions: No Arrival Time: 08:05 Had a fall or experienced change in No Accompanied By: father activities of daily living that may affect Transfer Assistance: None risk of falls: Patient Identification Verified: Yes Signs or symptoms of abuse/neglect No Secondary Verification Process Yes since last visito Completed: Hospitalized since last visit: No Patient Requires Transmission- No Has Dressing in Place as Prescribed: Yes Based Precautions: Has Footwear/Offloading in Place as Yes Patient Has Alerts: Yes Prescribed: Patient Alerts: ABI..[place] bilateral Left: Surgical Shoe with >255mmH Pressure Relief Insole Right: Surgical Shoe with Pressure Relief Insole Pain Present Now: No Electronic Signature(s) Signed: 07/16/2015 3:32:39 PM By: Dayton Martes RCP, RRT, CHT Entered By: Dayton Martes on 07/16/2015 08:19:13 [name] ([id number]) -------------------------------------------------------------------------------- Encounter Discharge Information Details [name], Wu 07/16/2015 8:00 Patient Name: Date of Service: N. AM Medical Record Patient Account Number: [id number] Number: Treating RN: 16-Aug-1981 (34 y.o. Other Clinician: Izetta Dakin Date of Birth/Sex: Female) Treating Britto, [name] Primary Care  Physician/Extender: [name] Physician: Referring Physician: [name] in Treatment: 15 Encounter Discharge Information Items Discharge Pain Level: 0 Discharge Condition: Stable Ambulatory Status: Wheelchair Discharge Destination: Home Private Transportation: Auto Accompanied By: father Schedule Follow-up Appointment: No Medication Reconciliation completed and No provided to Patient/Care Kahmari Koller: Clinical Summary of Care: Notes Patient has an HBO treatment scheduled on 07/17/15 at 08:00 am. Electronic Signature(s) Signed: 07/16/2015 3:32:39 PM By: Dayton Martes RCP, RRT, CHT Entered By: Dayton Martes on 07/16/2015 08:25:14 [name] ([id number]) -------------------------------------------------------------------------------- General Visit Notes Details [name], Wu 07/16/2015 8:00 Patient Name: Date of Service: N. AM Medical Record Patient Account Number: [id number] Number: Treating RN: 1980-12-12 (34 y.o. Other Clinician: Izetta Dakin Date of Birth/Sex: Female) Treating Britto, [name] Primary Care Physician/Extender: [name] Physician: Referring Physician: [name] in Treatment: 15 Notes Patient will not dive again today. Her blood glucose was 109 mg/dL. She once again declined any type of nourishment from [place] stating that it will make her nauseated/sick. 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 Huel Coventry, RN and myself attempted to encourage her to keep working with her blood glucose issues and speak with her Reah Justo to in an attempt to regulate her blood glucose levels so that she will be able to take her HBO treatments. Electronic Signature(s) Signed: 07/16/2015 3:32:39 PM By: Dayton Martes RCP, RRT, CHT Entered By: Dayton Martes on  07/16/2015 08:24:24 [name] ([id number]) -------------------------------------------------------------------------------- Vitals Details [name], [name] 07/16/2015 8:00 Patient Name: Date of Service: N. AM Medical Record Patient Account Number: [id number] Number: Treating RN: 1980-10-06 (34 y.o. Other Clinician: Izetta Dakin Date of Birth/Sex: Female) Treating Britto, [name] Primary Care Physician/Extender: [name] Physician: Referring Physician: [name] in Treatment: 15 Vital Signs Time Taken: 08:05 Capillary Blood Glucose (mg/dl): 353 Height (in): 68 Reference Range: 80 - 120 mg / dl Weight (lbs): 614 Body Mass Index (BMI): 18.5 Electronic Signature(s) Signed:  07/16/2015 3:32:39 PM By: Dayton Martes RCP, RRT, CHT Entered By: Dayton Martes on 07/16/2015 08:19:39

## 2015-07-17 ENCOUNTER — Encounter: Payer: Medicare Other | Admitting: Surgery

## 2015-07-18 ENCOUNTER — Encounter: Payer: Medicare Other | Admitting: Surgery

## 2015-07-18 ENCOUNTER — Ambulatory Visit: Payer: Medicare Other | Admitting: Surgery

## 2015-07-19 ENCOUNTER — Ambulatory Visit: Payer: Medicare Other | Admitting: General Surgery

## 2015-07-19 LAB — GLUCOSE, CAPILLARY: Glucose-Capillary: 124 mg/dL — ABNORMAL HIGH (ref 65–99)

## 2015-07-19 NOTE — Progress Notes (Signed)
seeiheal 

## 2015-07-22 ENCOUNTER — Encounter: Payer: Medicare Other | Admitting: Surgery

## 2015-07-26 ENCOUNTER — Encounter: Payer: Medicare Other | Admitting: Surgery

## 2015-07-26 ENCOUNTER — Ambulatory Visit: Payer: Medicare Other | Admitting: Surgery

## 2015-07-29 ENCOUNTER — Encounter: Payer: Medicare Other | Admitting: Surgery

## 2015-07-30 ENCOUNTER — Encounter: Payer: Medicare Other | Admitting: Surgery

## 2015-07-31 ENCOUNTER — Encounter: Payer: Medicare Other | Admitting: Surgery

## 2015-08-01 ENCOUNTER — Encounter: Payer: Medicare Other | Admitting: Surgery

## 2015-08-02 ENCOUNTER — Ambulatory Visit: Payer: Medicare Other | Admitting: Surgery

## 2015-08-02 ENCOUNTER — Encounter: Payer: Medicare Other | Admitting: Surgery

## 2015-08-05 ENCOUNTER — Encounter: Payer: Medicare Other | Admitting: Surgery

## 2015-08-06 ENCOUNTER — Encounter: Payer: Medicare Other | Admitting: Surgery

## 2015-08-07 ENCOUNTER — Encounter: Payer: Medicare Other | Admitting: Surgery

## 2015-08-08 ENCOUNTER — Encounter: Payer: Medicare Other | Admitting: Surgery

## 2015-08-09 ENCOUNTER — Encounter: Payer: Medicare Other | Admitting: Surgery

## 2015-08-09 ENCOUNTER — Ambulatory Visit: Payer: Medicare Other | Admitting: Surgery

## 2015-08-24 ENCOUNTER — Other Ambulatory Visit
Admission: RE | Admit: 2015-08-24 | Discharge: 2015-08-24 | Disposition: A | Discharge status: Home / Self Care | Payer: Medicare Other | Source: Ambulatory Visit | Attending: Nephrology | Admitting: Nephrology

## 2015-08-24 DIAGNOSIS — N186 End stage renal disease: Secondary | ICD-10-CM | POA: Insufficient documentation

## 2015-08-24 LAB — HEMOGLOBIN: Hemoglobin: 9 g/dL — ABNORMAL LOW (ref 12.0–16.0)

## 2015-08-29 ENCOUNTER — Ambulatory Visit: Payer: Medicare Other | Admitting: Surgery

## 2015-08-30 ENCOUNTER — Encounter: Payer: Medicare Other | Attending: Surgery | Admitting: Surgery

## 2015-08-30 DIAGNOSIS — L97521 Non-pressure chronic ulcer of other part of left foot limited to breakdown of skin: Secondary | ICD-10-CM | POA: Diagnosis not present

## 2015-08-30 DIAGNOSIS — K3184 Gastroparesis: Secondary | ICD-10-CM | POA: Insufficient documentation

## 2015-08-30 DIAGNOSIS — M069 Rheumatoid arthritis, unspecified: Secondary | ICD-10-CM | POA: Insufficient documentation

## 2015-08-30 DIAGNOSIS — I70245 Atherosclerosis of native arteries of left leg with ulceration of other part of foot: Secondary | ICD-10-CM | POA: Diagnosis not present

## 2015-08-30 DIAGNOSIS — K580 Irritable bowel syndrome with diarrhea: Secondary | ICD-10-CM | POA: Insufficient documentation

## 2015-08-30 DIAGNOSIS — E10621 Type 1 diabetes mellitus with foot ulcer: Secondary | ICD-10-CM | POA: Diagnosis present

## 2015-08-30 DIAGNOSIS — F5000 Anorexia nervosa, unspecified: Secondary | ICD-10-CM | POA: Insufficient documentation

## 2015-08-30 DIAGNOSIS — L97411 Non-pressure chronic ulcer of right heel and midfoot limited to breakdown of skin: Secondary | ICD-10-CM | POA: Diagnosis not present

## 2015-08-30 DIAGNOSIS — N186 End stage renal disease: Secondary | ICD-10-CM | POA: Insufficient documentation

## 2015-08-30 DIAGNOSIS — E1052 Type 1 diabetes mellitus with diabetic peripheral angiopathy with gangrene: Secondary | ICD-10-CM | POA: Diagnosis not present

## 2015-08-30 DIAGNOSIS — I70261 Atherosclerosis of native arteries of extremities with gangrene, right leg: Secondary | ICD-10-CM | POA: Insufficient documentation

## 2015-08-30 DIAGNOSIS — D649 Anemia, unspecified: Secondary | ICD-10-CM | POA: Diagnosis not present

## 2015-08-30 DIAGNOSIS — L89622 Pressure ulcer of left heel, stage 2: Secondary | ICD-10-CM | POA: Diagnosis not present

## 2015-08-30 DIAGNOSIS — I120 Hypertensive chronic kidney disease with stage 5 chronic kidney disease or end stage renal disease: Secondary | ICD-10-CM | POA: Diagnosis not present

## 2015-08-30 DIAGNOSIS — G629 Polyneuropathy, unspecified: Secondary | ICD-10-CM | POA: Insufficient documentation

## 2015-08-30 DIAGNOSIS — Z992 Dependence on renal dialysis: Secondary | ICD-10-CM | POA: Diagnosis not present

## 2015-08-31 NOTE — Progress Notes (Signed)
ELEESHA, PURKEY ([id number]) Visit Report for 08/30/2015 Abuse/Suicide Risk Screen Details LITHA, LAMARTINA Date of Service: 08/30/2015 3:00 PM Patient Name: N. Patient Account Number: [id number] Medical Record [name], RN, [name], [id number] Treating RN: Number: [place] Sink [birth year] (34 y.o. Other Clinician: Date of Birth/Sex: Female) Treating Britto, [name] Primary Care Physician: [name] Physician/Extender: Referring Physician: [name] in Treatment: 0 Abuse/Suicide Risk Screen Items Answer ABUSE/SUICIDE RISK SCREEN: Has anyone close to you tried to hurt or harm you recentlyo No Do you feel uncomfortable with anyone in your familyo No Has anyone forced you do things that you didnot want to doo No Do you have any thoughts of harming yourselfo No Patient displays signs or symptoms of abuse and/or neglect. No Electronic Signature(s) Signed: 08/30/2015 3:16:52 PM By: Elpidio Eric [name], RN Entered By: Elpidio Eric on 08/30/2015 15:16:52 [name] ([id number]) -------------------------------------------------------------------------------- Activities of Daily Living Details [name] Date of Service: 08/30/2015 3:00 PM Patient Name: N. Patient Account Number: [id number] Medical Record [name], RN, [name], [id number] Treating RN: Number: [place] Sink [birth year] (34 y.o. Other Clinician: Date of Birth/Sex: Female) Treating Britto, [name] Primary Care Physician: [name] Physician/Extender: Referring Physician: [name] in Treatment: 0 Activities of Daily Living Items Answer Activities of Daily Living (Please select one for each item) Take Medications Need Assistance Use Telephone Need Assistance Care for Appearance Need Assistance Use Toilet Need Assistance Bath / Shower Need Assistance Dress Self Need Assistance Feed Self Need Assistance Walk Need Assistance Get In / Out Bed Need Assistance Housework Need Assistance Prepare Meals Need  Assistance Handle Money Need Assistance Shop for Self Need Assistance Electronic Signature(s) Signed: 08/30/2015 3:16:39 PM By: Elpidio Eric [name], RN Entered By: Elpidio Eric on 08/30/2015 15:16:39 [name] ([id number]) -------------------------------------------------------------------------------- Education Assessment Details [name] Date of Service: 08/30/2015 3:00 PM Patient Name: N. Patient Account Number: [id number] Medical Record [name], RN, [name], [id number] Treating RN: Number: [place] Sink [birth year] (34 y.o. Other Clinician: Date of Birth/Sex: Female) Treating Britto, [name] Primary Care Physician: [name] Physician/Extender: Referring Physician: [name] in Treatment: 0 Primary Learner Assessed: Patient Learning Preferences/Education Level/Primary Language Learning Preference: Explanation Highest Education Level: College or Above Preferred Language: English Cognitive Barrier Assessment/Beliefs Language Barrier: No Physical Barrier Assessment Impaired Vision: No Impaired Hearing: No Decreased Hand dexterity: No Knowledge/Comprehension Assessment Knowledge Level: High Comprehension Level: High Ability to understand written High instructions: Ability to understand verbal High instructions: Motivation Assessment Anxiety Level: Calm Cooperation: Cooperative Education Importance: Acknowledges Need Interest in Health Problems: Asks Questions Perception: Coherent Willingness to Engage in Self- High Management Activities: Readiness to Engage in Self- High Management Activities: Electronic Signature(s) Signed: 08/30/2015 3:15:20 PM By: Elpidio Eric [name], RN [place], Ledell Peoples ([id number]) Entered By: Elpidio Eric on 08/30/2015 15:15:20 [name] ([id number]) -------------------------------------------------------------------------------- Fall Risk Assessment Details [name] Date of Service: 08/30/2015 3:00  PM Patient Name: N. Patient Account Number: [id number] Medical Record [name], RN, [name], [id number] Treating RN: Number: [place] Sink [birth year] (34 y.o. Other Clinician: Date of Birth/Sex: Female) Treating Britto, [name] Primary Care Physician: [name] Physician/Extender: Referring Physician: [name] in Treatment: 0 Fall Risk Assessment Items Have you had 2 or more falls in the last 12 monthso 0 No Have you had any fall that resulted in injury in the last 12 monthso 0 No FALL RISK ASSESSMENT: History of falling - immediate or within 3 months 0 No Secondary diagnosis 0 No Ambulatory aid None/bed rest/wheelchair/nurse 0 Yes Crutches/cane/walker 0 No Furniture 0 No IV Access/Saline Lock 0  No Gait/Training Normal/bed rest/immobile 0 Yes Weak 10 Yes Impaired 0 No Mental Status Oriented to own ability 0 Yes Electronic Signature(s) Signed: 08/30/2015 3:15:37 PM By: Elpidio Eric [name], RN Entered By: Elpidio Eric on 08/30/2015 15:15:37 [name] ([id number]) -------------------------------------------------------------------------------- Foot Assessment Details [name] Date of Service: 08/30/2015 3:00 PM Patient Name: N. Patient Account Number: [id number] Medical Record [name], RN, [name], [id number] Treating RN: Number: [place] Sink [birth year] (34 y.o. Other Clinician: Date of Birth/Sex: Female) Treating Britto, [name] Primary Care Physician: [name] Physician/Extender: Referring Physician: [name] in Treatment: 0 Foot Assessment Items Site Locations + = Sensation present, - = Sensation absent, C = Callus, U = Ulcer R = Redness, W = Warmth, M = Maceration, PU = Pre-ulcerative lesion F = Fissure, S = Swelling, D = Dryness Assessment Right: Left: Other Deformity: No No Prior Foot Ulcer: No No Prior Amputation: No No Charcot Joint: No No Ambulatory Status: Ambulatory With Help Assistance Device: Wheelchair Gait: Charity fundraiser) Signed: 08/30/2015 3:15:56 PM By: Elpidio Eric [name], RN [place], Ledell Peoples ([id number]) Entered By: Elpidio Eric on 08/30/2015 15:15:56 [name] ([id number]) -------------------------------------------------------------------------------- Nutrition Risk Assessment Details [name] Date of Service: 08/30/2015 3:00 PM Patient Name: N. Patient Account Number: [id number] Medical Record [name], RN, [name], [id number] Treating RN: Number: [place] Sink [birth year] (34 y.o. Other Clinician: Date of Birth/Sex: Female) Treating Britto, [name] Primary Care Physician: [name] Physician/Extender: Referring Physician: [name] in Treatment: 0 Height (in): 68 Weight (lbs): 122 Body Mass Index (BMI): 18.5 Nutrition Risk Assessment Items NUTRITION RISK SCREEN: I have an illness or condition that made me change the kind and/or 0 No amount of food I eat I eat fewer than two meals per day 0 No I eat few fruits and vegetables, or milk products 0 No I have three or more drinks of beer, liquor or wine almost every day 0 No I have tooth or mouth problems that make it hard for me to eat 0 No I don't always have enough money to buy the food I need 0 No I eat alone most of the time 0 No I take three or more different prescribed or over-the-counter drugs a 0 No day Without wanting to, I have lost or gained 10 pounds in the last six 2 Yes months I am not always physically able to shop, cook and/or feed myself 0 No Nutrition Protocols Good Risk Protocol 0 No interventions needed Moderate Risk Protocol Electronic Signature(s) Signed: 08/30/2015 3:16:14 PM By: Elpidio Eric [name], RN Entered By: Elpidio Eric on 08/30/2015 15:16:14

## 2015-08-31 NOTE — Progress Notes (Signed)
CHARNE, MCBRIEN ([id number]) Visit Report for 08/30/2015 Chief Complaint Document Details SHAMELL, HITTLE Date of Service: 08/30/2015 3:00 PM Patient Name: N. Patient Account Number: [id number] Medical Record [name], RN, [name], [id number] Treating RN: Number: [place] Sink [birth year] (34 y.o. Other Clinician: Date of Birth/Sex: Female) Treating Rexine Gowens Primary Care Physician: [name] Physician/Extender: Referring Physician: [name] in Treatment: 0 Information Obtained from: Patient Chief Complaint Patient in today for treatment of non-healing wound and HBO Treatment. she has just gotten out of hospital this week and is back to resume her hyperbaric oxygen therapy Electronic Signature(s) Signed: 08/30/2015 4:11:58 PM By: [name] MD, FACS Entered By: [name] on 08/30/2015 16:11:58 [name] ([id number]) -------------------------------------------------------------------------------- HPI Details [name] Date of Service: 08/30/2015 3:00 PM Patient Name: N. Patient Account Number: [id number] Medical Record [name], RN, [name], [id number] Treating RN: Number: [place] Sink Sep 04, 1981 (34 y.o. Other Clinician: Date of Birth/Sex: Female) Treating Ruble Buttler Primary Care Physician: [name] Physician/Extender: Referring Physician: [name] in Treatment: 0 History of Present Illness Location: dry gangrene both feet and heels Quality: Patient reports No Pain. Severity: Patient states wound are getting worse. Duration: Patient has had the wound for > 4months prior to seeking treatment at the wound center Context: The wound appeared gradually over time Modifying Factors: she has been in and out of hospital over the last 2 months Associated Signs and Symptoms: Patient reports having difficulty standing for long periods. HPI Description: Kalene Cutler is a 34 y.o. female who presents to our wound center, back in June 2016,  referred by her PCP Dr. Zada Finders for nonhealing ulcers on the lateral aspect of the right heel. Of note she has a history of type 1 diabetes mellitus that has been uncontrolled. Past medical history significant for type 1 diabetes mellitus not controlled, ankylosing spondylitis, anorexia nervosa, irritable bowel syndrome, chronic kidney disease, chronic diarrhea. she then developed gangrene of both feet due to severe peripheral vascular disease and also had gangrene of the tips of her fingers due to upper extremity vascular disease. She was being worked up by vascular surgery at [facility] and at [facility] and has had several procedures done there. She started with hyperbaric oxygen therapy and had a total of 40 treatments the last one being on 06/20/2015. After the initial treatment of hyperbaric oxygen therapy she started having ear problems and had ultimately to use myringotomy tubes and this was done bilaterally. Since then her ears have been doing fine. In late September, she had seen vascular and hand surgeons. since then she's been in [place] at the rec center for surgery involving extensive vascular procedures for the upper extremities. She was then at [facility] with GI bleeds both upper and lower and has been in and out of hospital for that. She has recently been out of hospital for the last week. Electronic Signature(s) Signed: 08/30/2015 4:17:09 PM By: [name] MD, FACS Entered By: [name] on 08/30/2015 16:17:09 [name] ([id number]) -------------------------------------------------------------------------------- Physical Exam Details [name] Date of Service: 08/30/2015 3:00 PM Patient Name: N. Patient Account Number: [id number] Medical Record [name], RN, [name], [id number] Treating RN: Number: [place] Sink 1981/05/24 (34 y.o. Other Clinician: Date of Birth/Sex: Female) Treating Jamielynn Wigley Primary Care Physician: [name] Physician/Extender: Referring  Physician: [name] in Treatment: 0 Constitutional . Pulse regular. Respirations normal and unlabored. Afebrile. . Eyes Nonicteric. Reactive to light. Ears, Nose, Mouth, and Throat Lips, teeth, and gums WNL.Marland Kitchen Moist mucosa without lesions . Neck supple and nontender.  No palpable supraclavicular or cervical adenopathy. Normal sized without goiter. Respiratory WNL. No retractions.. Cardiovascular Pedal Pulses WNL. ABIs were not measured today as she is under active vascular care. she has got lower extremity edema +2 pitting edema. Gastrointestinal (GI) Abdomen without masses or tenderness.. No liver or spleen enlargement or tenderness.. Lymphatic No adneopathy. No adenopathy. No adenopathy. Musculoskeletal Adexa without tenderness or enlargement.. Digits and nails w/o clubbing, cyanosis, infection, petechiae, ischemia, or inflammatory conditions.. Integumentary (Hair, Skin) No suspicious lesions. No crepitus or fluctuance. No peri-wound warmth or erythema. No masses.Marland Kitchen Psychiatric Judgement and insight Intact.. No evidence of depression, anxiety, or agitation.. Notes due to peripheral vascular disease she has dry gangrene of the heel and of the right forefoot. She also has dry gangrene of the left forefoot. Her left heel has a pressure ulcer and this is a stage II pressure ulcer. Electronic Signature(s) Signed: 08/30/2015 4:18:24 PM By: [name] MD, FACS Entered By: [name] on 08/30/2015 16:18:24 [name] ([id number]) Simeon Craft, Ledell Peoples ([id number]) -------------------------------------------------------------------------------- Physician Orders Details [name] Date of Service: 08/30/2015 3:00 PM Patient Name: N. Patient Account Number: [id number] Medical Record [name], RN, [name], [id number] Treating RN: Number: [place] Sink April 09, 1981 (34 y.o. Other Clinician: Date of Birth/Sex: Female) Treating Geetika Laborde Primary Care Physician: [name] Physician/Extender: Referring Physician: [name] in Treatment: 0 Verbal / Phone Orders: Yes Clinician: [name], RN, [name], [name] Read Back and Verified: Yes Diagnosis Coding Wound Cleansing Wound #10 Right Calcaneous o Cleanse wound with mild soap and water o May Shower, gently pat wound dry prior to applying new dressing. Wound #11 Left,Circumferential Toe Second o Cleanse wound with mild soap and water o May Shower, gently pat wound dry prior to applying new dressing. Wound #12 Left,Circumferential Toe Great o Cleanse wound with mild soap and water o May Shower, gently pat wound dry prior to applying new dressing. Wound #13 Right,Circumferential Toe Third o Cleanse wound with mild soap and water o May Shower, gently pat wound dry prior to applying new dressing. Wound #14 Right,Plantar,Circumferential Toe Great o Cleanse wound with mild soap and water o May Shower, gently pat wound dry prior to applying new dressing. Wound #9 Left Calcaneous o Cleanse wound with mild soap and water o May Shower, gently pat wound dry prior to applying new dressing. Primary Wound Dressing Wound #10 Right Calcaneous o Other: - betadine paint Wound #11 Left,Circumferential Toe Second o Other: - betadine paint Wound #12 Left,Circumferential Toe Great o Other: - betadine paint Mestas, Olivette N. ([id number]) Wound #13 Right,Circumferential Toe Third o Other: - betadine paint Wound #14 Right,Plantar,Circumferential Toe Great o Other: - betadine paint Wound #9 Left Calcaneous o Other: - betadine paint o Aquacel Ag Secondary Dressing Wound #10 Right Calcaneous o Gauze and Kerlix/Conform Wound #11 Left,Circumferential Toe Second o Gauze and Kerlix/Conform Wound #12 Left,Circumferential Toe Great o Gauze and Kerlix/Conform Wound #13 Right,Circumferential Toe Third o Gauze and Kerlix/Conform Wound #14 Right,Plantar,Circumferential  Toe Great o Gauze and Kerlix/Conform Wound #9 Left Calcaneous o Boardered Foam Dressing Dressing Change Frequency Wound #10 Right Calcaneous o Change dressing every day. Wound #11 Left,Circumferential Toe Second o Change dressing every day. Wound #12 Left,Circumferential Toe Great o Change dressing every day. Wound #13 Right,Circumferential Toe Third o Change dressing every day. Wound #14 Right,Plantar,Circumferential Toe Great o Change dressing every day. Wound #9 Left Calcaneous o Change dressing every other day. ZAYNEB, BAUCUM ([id number]) Follow-up Appointments Wound #10 Right Calcaneous o Return Appointment in 1 week. Wound #  11 Left,Circumferential Toe Second o Return Appointment in 1 week. Wound #12 Left,Circumferential Toe Great o Return Appointment in 1 week. Wound #13 Right,Circumferential Toe Third o Return Appointment in 1 week. Wound #14 Right,Plantar,Circumferential Toe Great o Return Appointment in 1 week. Wound #9 Left Calcaneous o Return Appointment in 1 week. Additional Orders / Instructions Wound #10 Right Calcaneous o Increase protein intake. o Activity as tolerated Wound #11 Left,Circumferential Toe Second o Increase protein intake. o Activity as tolerated Wound #12 Left,Circumferential Toe Great o Increase protein intake. o Activity as tolerated Wound #13 Right,Circumferential Toe Third o Increase protein intake. o Activity as tolerated Wound #14 Right,Plantar,Circumferential Toe Great o Increase protein intake. o Activity as tolerated Wound #9 Left Calcaneous o Increase protein intake. o Activity as tolerated Electronic Signature(s) Signed: 08/30/2015 4:01:18 PM By: Elpidio Eric [name], RN Signed: 08/30/2015 4:40:44 PM By: [name] MD, FACS [place], Ledell Peoples ([id number]) Entered By: Elpidio Eric on 08/30/2015 16:01:17 [name]  ([id number]) -------------------------------------------------------------------------------- Problem List Details [name] Date of Service: 08/30/2015 3:00 PM Patient Name: N. Patient Account Number: [id number] Medical Record [name], RN, [name], [id number] Treating RN: Number: [place] Sink 02-04-1981 (34 y.o. Other Clinician: Date of Birth/Sex: Female) Treating Veanna Dower Primary Care Physician: [name] Physician/Extender: Referring Physician: [name] in Treatment: 0 Active Problems ICD-10 Encounter Code Description Active Date Diagnosis E10.621 Type 1 diabetes mellitus with foot ulcer 08/30/2015 Yes E10.52 Type 1 diabetes mellitus with diabetic peripheral 08/30/2015 Yes angiopathy with gangrene I70.245 Atherosclerosis of native arteries of left leg with ulceration 08/30/2015 Yes of other part of foot I70.261 Atherosclerosis of native arteries of extremities with 08/30/2015 Yes gangrene, right leg L89.622 Pressure ulcer of left heel, stage 2 08/30/2015 Yes Inactive Problems Resolved Problems Electronic Signature(s) Signed: 08/30/2015 4:11:24 PM By: [name] MD, FACS Entered By: [name] on 08/30/2015 16:11:24 [name] ([id number]) -------------------------------------------------------------------------------- Progress Note Details [name] Date of Service: 08/30/2015 3:00 PM Patient Name: N. Patient Account Number: [id number] Medical Record [name], RN, [name], [id number] Treating RN: Number: [place] Sink [birth year] (34 y.o. Other Clinician: Date of Birth/Sex: Female) Treating Kimoni Pickerill Primary Care Physician: [name] Physician/Extender: Referring Physician: [name] in Treatment: 0 Subjective Chief Complaint Information obtained from Patient Patient in today for treatment of non-healing wound and HBO Treatment. she has just gotten out of hospital this week and is back to resume her hyperbaric oxygen  therapy History of Present Illness (HPI) The following HPI elements were documented for the patient's wound: Location: dry gangrene both feet and heels Quality: Patient reports No Pain. Severity: Patient states wound are getting worse. Duration: Patient has had the wound for > 68months prior to seeking treatment at the wound center Context: The wound appeared gradually over time Modifying Factors: she has been in and out of hospital over the last 2 months Associated Signs and Symptoms: Patient reports having difficulty standing for long periods. Danica Camarena is a 34 y.o. female who presents to our wound center, back in June 2016, referred by her PCP Dr. Zada Finders for nonhealing ulcers on the lateral aspect of the right heel. Of note she has a history of type 1 diabetes mellitus that has been uncontrolled. Past medical history significant for type 1 diabetes mellitus not controlled, ankylosing spondylitis, anorexia nervosa, irritable bowel syndrome, chronic kidney disease, chronic diarrhea. she then developed gangrene of both feet due to severe peripheral vascular disease and also had gangrene of the tips of her fingers due to upper extremity vascular disease.  She was being worked up by vascular surgery at [facility] and at [facility] and has had several procedures done there. She started with hyperbaric oxygen therapy and had a total of 40 treatments the last one being on 06/20/2015. After the initial treatment of hyperbaric oxygen therapy she started having ear problems and had ultimately to use myringotomy tubes and this was done bilaterally. Since then her ears have been doing fine. In late September, she had seen vascular and hand surgeons. since then she's been in [place] at the rec center for surgery involving extensive vascular procedures for the upper extremities. She was then at [facility] with GI bleeds both upper and lower and has been in and out of hospital for that. She has recently  been out of hospital for the last week. Wound History Patient presents with 8 open wounds that have been present for approximately years. Patient has been treating wounds in the following manner: betadine. Laboratory tests have been performed in the last month. KARLA, PAVONE ([id number]) Patient reportedly has not tested positive for an antibiotic resistant organism. Patient reportedly has not tested positive for osteomyelitis. Patient reportedly has not had testing performed to evaluate circulation in the legs. Patient History Information obtained from Patient. Allergies no known drug allergies Family History Cancer - Maternal Grandparents, Diabetes - Maternal Grandparents, Paternal Grandparents, Heart Disease - Father, Hypertension - Maternal Grandparents, Stroke - Paternal Grandparents, Thyroid Problems - Mother, No family history of Hereditary Spherocytosis, Kidney Disease, Lung Disease, Seizures, Tuberculosis. Social History Never smoker, Marital Status - Single, Alcohol Use - Never, Drug Use - No History, Caffeine Use - Daily - soda. Medical And Surgical History Notes Cardiovascular low blood pressure Gastrointestinal HX of nausea, vomiting gastroparesis Psychiatric Insomnia Review of Systems (ROS) Eyes The patient has no complaints or symptoms. Ear/Nose/Mouth/Throat The patient has no complaints or symptoms. Hematologic/Lymphatic The patient has no complaints or symptoms. Respiratory The patient has no complaints or symptoms. Cardiovascular The patient has no complaints or symptoms. Immunological The patient has no complaints or symptoms. Integumentary (Skin) Complains or has symptoms of Wounds. Musculoskeletal The patient has no complaints or symptoms. Neurologic The patient has no complaints or symptoms. Psychiatric The patient has no complaints or symptoms. ZELDA, REAMES ([id number]) Medication list: I have had a detailed look at all her  medications over the last month. Objective Constitutional Pulse regular. Respirations normal and unlabored. Afebrile. Vitals Time Taken: 3:21 PM, Temperature: 98.2 F, Pulse: 93 bpm, Respiratory Rate: 16 breaths/min, Blood Pressure: 117/53 mmHg. Eyes Nonicteric. Reactive to light. Ears, Nose, Mouth, and Throat Lips, teeth, and gums WNL.Marland Kitchen Moist mucosa without lesions . Neck supple and nontender. No palpable supraclavicular or cervical adenopathy. Normal sized without goiter. Respiratory WNL. No retractions.. Cardiovascular Pedal Pulses WNL. ABIs were not measured today as she is under active vascular care. she has got lower extremity edema +2 pitting edema. Gastrointestinal (GI) Abdomen without masses or tenderness.. No liver or spleen enlargement or tenderness.. Lymphatic No adneopathy. No adenopathy. No adenopathy. Musculoskeletal Adexa without tenderness or enlargement.. Digits and nails w/o clubbing, cyanosis, infection, petechiae, ischemia, or inflammatory conditions.Marland Kitchen Psychiatric Judgement and insight Intact.. No evidence of depression, anxiety, or agitation.Marland Kitchen TAYLON, LOUISON ([id number]) General Notes: due to peripheral vascular disease she has dry gangrene of the heel and of the right forefoot. She also has dry gangrene of the left forefoot. Her left heel has a pressure ulcer and this is a stage II pressure ulcer. Integumentary (Hair, Skin) No suspicious lesions. No crepitus  or fluctuance. No peri-wound warmth or erythema. No masses.. Wound #10 status is Open. Original cause of wound was Pressure Injury. The wound is located on the Right Calcaneous. The wound measures 4.5cm length x 9.5cm width x 0.1cm depth; 33.576cm^2 area and 3.358cm^3 volume. The wound is limited to skin breakdown. There is no tunneling or undermining noted. There is a none present amount of drainage noted. The wound margin is distinct with the outline attached to the wound base. There is no  granulation within the wound bed. There is a large (67-100%) amount of necrotic tissue within the wound bed including Eschar. The periwound skin appearance exhibited: Dry/Scaly. The periwound skin appearance did not exhibit: Callus, Crepitus, Excoriation, Fluctuance, Friable, Induration, Localized Edema, Rash, Scarring, Maceration, Moist, Atrophie Blanche, Cyanosis, Ecchymosis, Hemosiderin Staining, Mottled, Pallor, Rubor, Erythema. Periwound temperature was noted as No Abnormality. Wound #11 status is Open. Original cause of wound was Gradually Appeared. The wound is located on the Left,Circumferential Toe Second. The wound measures 10.5cm length x 2.5cm width x 0.1cm depth; 20.617cm^2 area and 2.062cm^3 volume. The wound is limited to skin breakdown. There is no tunneling or undermining noted. There is a none present amount of drainage noted. The wound margin is indistinct and nonvisible. There is no granulation within the wound bed. There is a large (67-100%) amount of necrotic tissue within the wound bed including Eschar. The periwound skin appearance exhibited: Dry/Scaly. The periwound skin appearance did not exhibit: Callus, Crepitus, Excoriation, Fluctuance, Friable, Induration, Localized Edema, Rash, Scarring, Maceration, Moist, Atrophie Blanche, Cyanosis, Ecchymosis, Hemosiderin Staining, Mottled, Pallor, Rubor, Erythema. Periwound temperature was noted as No Abnormality. Wound #12 status is Open. Original cause of wound was Gradually Appeared. The wound is located on the Left,Circumferential Toe Great. The wound measures 10cm length x 5.5cm width x 0.1cm depth; 43.197cm^2 area and 4.32cm^3 volume. The wound is limited to skin breakdown. There is no tunneling or undermining noted. There is a none present amount of drainage noted. The wound margin is indistinct and nonvisible. There is no granulation within the wound bed. There is a large (67-100%) amount of necrotic tissue within the  wound bed including Eschar. The periwound skin appearance exhibited: Dry/Scaly. The periwound skin appearance did not exhibit: Callus, Crepitus, Excoriation, Fluctuance, Friable, Induration, Localized Edema, Rash, Scarring, Maceration, Moist, Atrophie Blanche, Cyanosis, Ecchymosis, Hemosiderin Staining, Mottled, Pallor, Rubor, Erythema. Periwound temperature was noted as No Abnormality. Wound #13 status is Open. Original cause of wound was Gradually Appeared. The wound is located on the Right,Circumferential Toe Third. The wound measures 10cm length x 2cm width x 0.1cm depth; 15.708cm^2 area and 1.571cm^3 volume. The wound is limited to skin breakdown. There is no tunneling or undermining noted. There is a none present amount of drainage noted. The wound margin is indistinct and nonvisible. There is no granulation within the wound bed. There is a large (67-100%) amount of necrotic tissue within the wound bed. The periwound skin appearance exhibited: Dry/Scaly. The periwound skin appearance did not exhibit: Callus, Crepitus, Excoriation, Fluctuance, Friable, Induration, Localized Edema, Rash, Scarring, Maceration, Moist, Atrophie Blanche, Cyanosis, Ecchymosis, Hemosiderin Staining, Mottled, Pallor, Rubor, Erythema. Periwound temperature was noted as No Abnormality. Wound #14 status is Open. Original cause of wound was Gradually Appeared. The wound is located on the [place], HATICE BUBEL. ([id number]) Right,Plantar,Circumferential Toe Great. The wound measures 14cm length x 5cm width x 0.1cm depth; 54.978cm^2 area and 5.498cm^3 volume. The wound is limited to skin breakdown. There is no tunneling or undermining noted. There is a  none present amount of drainage noted. The wound margin is indistinct and nonvisible. There is no granulation within the wound bed. There is a large (67-100%) amount of necrotic tissue within the wound bed including Eschar. The periwound skin appearance exhibited: Dry/Scaly.  The periwound skin appearance did not exhibit: Callus, Crepitus, Excoriation, Fluctuance, Friable, Induration, Localized Edema, Rash, Scarring, Maceration, Moist, Atrophie Blanche, Cyanosis, Ecchymosis, Hemosiderin Staining, Mottled, Pallor, Rubor, Erythema. Periwound temperature was noted as No Abnormality. Wound #9 status is Open. Original cause of wound was Pressure Injury. The wound is located on the Left Calcaneous. The wound measures 4.3cm length x 5.5cm width x 0.1cm depth; 18.575cm^2 area and 1.857cm^3 volume. The wound is limited to skin breakdown. There is no tunneling or undermining noted. There is a small amount of serosanguineous drainage noted. The wound margin is distinct with the outline attached to the wound base. There is medium (34-66%) granulation within the wound bed. There is a small (1-33%) amount of necrotic tissue within the wound bed including Adherent Slough. The periwound skin appearance exhibited: Moist. The periwound skin appearance did not exhibit: Callus, Crepitus, Excoriation, Fluctuance, Friable, Induration, Localized Edema, Rash, Scarring, Dry/Scaly, Maceration, Atrophie Blanche, Cyanosis, Ecchymosis, Hemosiderin Staining, Mottled, Pallor, Rubor, Erythema. Periwound temperature was noted as No Abnormality. The periwound has tenderness on palpation. 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 Her vascular surgeons have recommended she resume this as soon as possible and this will definitely benefit her Wagner 5 DFU. I'm going to recommend 40 treatments initially and we will start this as soon as we can get her insurance clearance. JOLEAH, KOSAK ([id number]) She already has bilateral myringotomy tubes and is otherwise in fairly good health at present having had all the investigations done at [facility]. Her mother and she had all questions answered and we will proceed with hyperbaric oxygen therapy as soon as possible. Plan Wound Cleansing: Wound #10 Right Calcaneous: Cleanse wound with mild soap and water May Shower, gently pat wound dry prior to applying new dressing. Wound #11 Left,Circumferential Toe Second: Cleanse wound with mild soap and water May Shower, gently pat wound dry prior to applying new dressing. Wound #12 Left,Circumferential Toe Great: Cleanse wound with mild soap and water May Shower, gently pat wound dry prior to applying new dressing. Wound #13 Right,Circumferential Toe Third: Cleanse wound with mild soap and water May Shower, gently pat wound dry prior to applying new dressing. Wound #14 Right,Plantar,Circumferential Toe Great: Cleanse wound with mild soap and water May Shower, gently pat wound dry prior to applying new dressing. Wound #9 Left Calcaneous: Cleanse wound with mild soap and water May Shower, gently pat wound dry prior to applying new dressing. Primary Wound Dressing: Wound #10 Right Calcaneous: Other: - betadine paint Wound #11 Left,Circumferential Toe Second: Other: - betadine paint Wound #12 Left,Circumferential Toe Great: Other: - betadine paint Wound #13 Right,Circumferential Toe Third: Other: - betadine paint Wound #14 Right,Plantar,Circumferential Toe Great:  Other: - betadine paint Wound #9 Left Calcaneous: Other: - betadine paint Aquacel Ag Secondary Dressing: Wound #10  Right Calcaneous: Gauze and Kerlix/Conform Wound #11 Left,Circumferential Toe Second: Gauze and Kerlix/Conform Wound #12 Left,Circumferential Toe Great: Gauze and Kerlix/Conform Wound #13 Right,Circumferential Toe ThirdMARRY, KUSCH. ([id number]) Gauze and Kerlix/Conform Wound #14 Right,Plantar,Circumferential Toe Great: Gauze and Kerlix/Conform Wound #9 Left Calcaneous: Boardered Foam Dressing Dressing Change Frequency: Wound #10 Right Calcaneous: Change dressing every day. Wound #11 Left,Circumferential Toe Second: Change dressing every day. Wound #12 Left,Circumferential Toe Great: Change dressing every day. Wound #13 Right,Circumferential Toe Third: Change dressing every day. Wound #14 Right,Plantar,Circumferential Toe Great: Change dressing every day. Wound #9 Left Calcaneous: Change dressing every other day. Follow-up Appointments: Wound #10 Right Calcaneous: Return Appointment in 1 week. Wound #11 Left,Circumferential Toe Second: Return Appointment in 1 week. Wound #12 Left,Circumferential Toe Great: Return Appointment in 1 week. Wound #13 Right,Circumferential Toe Third: Return Appointment in 1 week. Wound #14 Right,Plantar,Circumferential Toe Great: Return Appointment in 1 week. Wound #9 Left Calcaneous: Return Appointment in 1 week. Additional Orders / Instructions: Wound #10 Right Calcaneous: Increase protein intake. Activity as tolerated Wound #11 Left,Circumferential Toe Second: Increase protein intake. Activity as tolerated Wound #12 Left,Circumferential Toe Great: Increase protein intake. Activity as tolerated Wound #13 Right,Circumferential Toe Third: Increase protein intake. Activity as tolerated Wound #14 Right,Plantar,Circumferential Toe Great: Increase protein intake. Activity as tolerated Wound #9 Left Calcaneous: Increase protein intake. 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 Her vascular surgeons have recommended she resume this as soon as possible and this will definitely benefit her Wagner 5 DFU. I'm going to recommend 40 treatments initially and we will start this as soon as we can get her insurance clearance. She already has bilateral myringotomy tubes and is otherwise in fairly good health at present having had all the investigations done at [facility]. Her mother and she had all questions answered and we will proceed with hyperbaric oxygen therapy as soon as possible. Electronic Signature(s) Signed: 08/30/2015 4:21:21 PM By: [name] MD, FACS Entered By: [name] on 08/30/2015 16:21:21 [name] ([id number]) -------------------------------------------------------------------------------- ROS/PFSH Details [name] Date of Service: 08/30/2015 3:00 PM Patient Name: N. Patient Account Number: [id number] Medical Record [name], RN, [name], [id number] Treating RN: Number: [place] Sink June 05, 1981 (34 y.o. Other Clinician: Date of Birth/Sex: Female) Treating Mouhamad Teed Primary Care Physician: [name] Physician/Extender: Referring Physician: [name] in Treatment: 0 Information Obtained From Patient Wound History Do you currently have one or more open woundso Yes How many open wounds do you currently haveo 8 Approximately how long have you had your woundso years How have you been treating your wound(s) until nowo betadine Has your wound(s) ever healed and then re-openedo No Have you tested positive for an antibiotic resistant organism (MRSA, VRE)o No Have you tested positive for osteomyelitis (bone infection)o No Have you had any tests for circulation on your legso No Integumentary  (Skin) Complaints and Symptoms: Positive for: Wounds Medical History: Negative for: History of Burn; History of pressure wounds Eyes Complaints and Symptoms: No Complaints or Symptoms Medical History: Negative for: Cataracts; Glaucoma; Optic Neuritis Ear/Nose/Mouth/Throat Complaints and Symptoms: No Complaints or Symptoms Medical History: Negative for: Chronic sinus problems/congestion; Middle ear problems Hematologic/Lymphatic Hawbaker,  Ellyssa N. ([id number]) Complaints and Symptoms: No Complaints or Symptoms Medical History: Positive for: Anemia Respiratory Complaints and Symptoms: No Complaints or Symptoms Medical History: Negative for: Aspiration; Asthma; Pneumothorax; Sleep Apnea; Tuberculosis Cardiovascular Complaints and Symptoms: No Complaints or Symptoms Medical History: Negative for: Angina; Arrhythmia; Congestive Heart Failure; Coronary Artery Disease; Deep Vein Thrombosis; Hypertension; Hypotension; Myocardial Infarction; Peripheral Arterial Disease; Peripheral Venous Disease; Phlebitis; Vasculitis Past Medical History Notes: low blood pressure Gastrointestinal Medical History: Negative for: Cirrhosis ; Colitis; Crohnos; Hepatitis A; Hepatitis B; Hepatitis C Past Medical History Notes: HX of nausea, vomiting gastroparesis Endocrine Medical History: Positive for: Type I Diabetes - since ager 13 Time with diabetes: since age 55 years Treated with: Insulin Blood sugar tested every day: Yes Tested : 6 times a day Blood sugar testing results: Breakfast: 180-240 Genitourinary Medical History: Positive for: End Stage Renal Disease - for 4 years Immunological Piacente, Delcenia N. ([id number]) Complaints and Symptoms: No Complaints or Symptoms Medical History: Negative for: Lupus Erythematosus; Raynaudos; Scleroderma Musculoskeletal Complaints and Symptoms: No Complaints or Symptoms Medical History: Positive for: Rheumatoid Arthritis Negative for:  Gout; Osteoarthritis; Osteomyelitis Neurologic Complaints and Symptoms: No Complaints or Symptoms Medical History: Positive for: Neuropathy Negative for: Dementia; Quadriplegia; Paraplegia; Seizure Disorder Oncologic Medical History: Negative for: Received Chemotherapy; Received Radiation Psychiatric Complaints and Symptoms: No Complaints or Symptoms Medical History: Negative for: Anorexia/bulimia; Confinement Anxiety Past Medical History Notes: Insomnia Family and Social History Cancer: Yes - Maternal Grandparents; Diabetes: Yes - Maternal Grandparents, Paternal Grandparents; Heart Disease: Yes - Father; Hereditary Spherocytosis: No; Hypertension: Yes - Maternal Grandparents; Kidney Disease: No; Lung Disease: No; Seizures: No; Stroke: Yes - Paternal Grandparents; Thyroid Problems: Yes - Mother; Tuberculosis: No; Never smoker; Marital Status - Single; Alcohol Use: Never; Drug Use: No History; Caffeine Use: Daily - soda; Financial Concerns: No; Food, Clothing or Shelter Needs: No; Support System Lacking: No; Transportation Concerns: No; Advanced Directives: No; Patient does not want information on Advanced Directives; Living Will: No Physician Affirmation I have reviewed and agree with the above information. RUMALDA, BROADDUS ([id number]) Electronic Signature(s) Signed: 08/30/2015 4:09:03 PM By: [name] MD, FACS Signed: 08/30/2015 4:37:38 PM By: Elpidio Eric [name], RN Previous Signature: 08/30/2015 3:23:13 PM Version By: Elpidio Eric [name], RN Entered By: [name] on 08/30/2015 16:09:03 [name] ([id number]) -------------------------------------------------------------------------------- SuperBill Details [name] Date of Service: 08/30/2015 Patient Name: N. Patient Account Number: [id number] Medical Record [name], RN, [name], [id number] Treating RN: Number: [place] Sink 1980-10-22 (34 y.o. 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 native arteries of extremities with gangrene, right leg L89.622 Pressure ulcer of left heel, stage 2 Facility Procedures CPT4 Code: 34287681 Description: 15726 - WOUND CARE VISIT-LEV 5 EST PT Modifier: Quantity: 1 Physician Procedures CPT4: Description Modifier Quantity Code [id number] - WC PHYS LEVEL 4 - EST PT 1 ICD-10 Description Diagnosis E10.621 Type 1 diabetes mellitus with foot ulcer I70.245 Atherosclerosis of native arteries of left leg with ulceration of other part of foot  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

## 2015-08-31 NOTE — Progress Notes (Addendum)
[name], [name] ([id number]) Visit Report for 08/30/2015 Allergy List Details [name], [name] Date of Service: 08/30/2015 3:00 PM Patient Name: N. Patient Account Number: [id number] Medical Record [name], RN, [name], [id number] Treating RN: Number: [place] Sink Date of Birth/Sex: 1981/09/28 (34 y.o. Female) Other Clinician: Primary Care Physician: [name] Treating [name] Referring Physician: [name] Physician/Extender: Tania Ade in Treatment: 0 Allergies Active Allergies no known drug allergies Allergy Notes Electronic Signature(s) Signed: 08/30/2015 3:14:54 PM By: Elpidio Eric [name], RN Entered By: Elpidio Eric on 08/30/2015 15:14:54 [name] ([id number]) -------------------------------------------------------------------------------- Arrival Information Details [name] Date of Service: 08/30/2015 3:00 PM Patient Name: N. Patient Account Number: [id number] Medical Record [name], RN, [name], [id number] Treating RN: Number: [place] Sink Date of Birth/Sex: September 24, 1981 (34 y.o. Female) Other Clinician: Primary Care Physician: [name] Treating [name] Referring Physician: [name] Physician/Extender: Tania Ade in Treatment: 0 Visit Information Patient Arrived: Wheel Chair Arrival Time: 15:18 Accompanied By: mother Transfer Assistance: None Patient Identification Verified: Yes Secondary Verification Process Yes Completed: Patient Requires Transmission-Based No Precautions: Patient Has Alerts: Yes Patient Alerts: ABI:[place] bilateral History Since Last Visit Any new allergies or adverse reactions: No Had a fall or experienced change in activities of daily living that may affect risk of falls: No Signs or symptoms of abuse/neglect since last visito No Hospitalized since last visit: No Has Dressing in Place as Prescribed: Yes Electronic Signature(s) Signed: 08/30/2015 3:44:08 PM By: Elpidio Eric [name], RN Previous Signature: 08/30/2015 3:20:06 PM  Version By: Elpidio Eric [name], RN Entered By: Elpidio Eric on 08/30/2015 15:44:08 [name] ([id number]) -------------------------------------------------------------------------------- Clinic Level of Care Assessment Details [name] Date of Service: 08/30/2015 3:00 PM Patient Name: N. Patient Account Number: [id number] Medical Record [name], RN, [name], [id number] Treating RN: Number: [place] Sink Date of Birth/Sex: 1981/05/30 (34 y.o. Female) Other Clinician: Primary Care Physician: [name] Treating [name] Referring Physician: [name] Physician/Extender: Tania Ade in Treatment: 0 Clinic Level of Care Assessment Items TOOL 2 Quantity Score  - Use when only an EandM is performed on the INITIAL visit 0 ASSESSMENTS - Nursing Assessment / Reassessment X - General Physical Exam (combine w/ comprehensive assessment (listed just 1 20 below) when performed on new pt. evals) X - Comprehensive Assessment (HX, ROS, Risk Assessments, Wounds Hx, etc.) 1 25 ASSESSMENTS - Wound and Skin Assessment / Reassessment  - Simple Wound Assessment / Reassessment - one wound 0 X - Complex Wound Assessment / Reassessment - multiple wounds 7 5  - Dermatologic / Skin Assessment (not related to wound area) 0 ASSESSMENTS - Ostomy and/or Continence Assessment and Care  - Incontinence Assessment and Management 0  - Ostomy Care Assessment and Management (repouching, etc.) 0 PROCESS - Coordination of Care X - Simple Patient / Family Education for ongoing care 1 15  - Complex (extensive) Patient / Family Education for ongoing care 0 X - Staff obtains Chiropractor, Records, Test Results / Process Orders 1 10  - Staff telephones HHA, Nursing Homes / Clarify orders / etc 0  - Routine Transfer to another Facility (non-emergent condition) 0  - Routine Hospital Admission (non-emergent condition) 0  - New Admissions / Manufacturing engineer / Ordering NPWT, Apligraf, etc. 0 X -  Emergency Hospital Admission (emergent condition) 1 20 Pink, [name] ([id number])  - Simple Discharge Coordination 0  - Complex (extensive) Discharge Coordination 0 PROCESS - Special Needs  - Pediatric / Minor Patient Management 0  - Isolation Patient Management 0  - Hearing / Language / Visual special needs 0  -  Assessment of Community assistance (transportation, D/C planning, etc.) 0 []  - Additional assistance / Altered mentation 0 []  - Support Surface(s) Assessment (bed, cushion, seat, etc.) 0 INTERVENTIONS - Wound Cleansing / Measurement X - Wound Imaging (photographs - any number of wounds) 1 5 []  - Wound Tracing (instead of photographs) 0 []  - Simple Wound Measurement - one wound 0 X - Complex Wound Measurement - multiple wounds 7 5 []  - Simple Wound Cleansing - one wound 0 X - Complex Wound Cleansing - multiple wounds 7 5 INTERVENTIONS - Wound Dressings X - Small Wound Dressing one or multiple wounds 7 10 []  - Medium Wound Dressing one or multiple wounds 0 []  - Large Wound Dressing one or multiple wounds 0 []  - Application of Medications - injection 0 INTERVENTIONS - Miscellaneous []  - External ear exam 0 []  - Specimen Collection (cultures, biopsies, blood, body fluids, etc.) 0 []  - Specimen(s) / Culture(s) sent or taken to Lab for analysis 0 []  - Patient Transfer (multiple staff / / Similar devices) 0 []  - Simple Staple / Suture removal (25 or less) 0 Sprong, Tomeko N. ( ) []  - Complex Staple / Suture removal (26 or more) 0 []  - Hypo / Hyperglycemic Management (close monitor of Blood Glucose) 0 []  - Ankle / Brachial Index (ABI) - do not check if billed separately 0 Has the patient been seen at the hospital within the last three years: Yes Total Score: 270 Level Of Care: New/Established - Level 5 Electronic Signature(s) Signed: 08/30/2015 4:33:53 PM By: [name], RN Entered By: on 08/30/2015 16:33:53 ( ) -------------------------------------------------------------------------------- Encounter Discharge Information Details Date of Service: 08/30/2015 3:00 PM Patient Name: N. Patient Account Number: Medical Record [name], RN, [name], Treating RN: Number: Nurse, adult Date of Birth/Sex: 04-30-1981 (34 y.o. Female) Other Clinician: Primary Care Physician: [id number] Treating Referring Physician: Physician/Extender: in Treatment: 0 Encounter Discharge Information Items Schedule Follow-up Appointment: No Medication Reconciliation completed No and provided to Patient/Care Bobbette Eakes: Provided on Clinical Summary of Care: 08/30/2015 Form Type Recipient Paper Patient HB Electronic Signature(s) Signed: 08/30/2015 4:12:35 PM By: Elpidio Eric Entered By: 14/10/2014 on 08/30/2015 16:12:35 [id number] ([name]) -------------------------------------------------------------------------------- Lower Extremity Assessment Details 14/10/2014 Date of Service: 08/30/2015 3:00 PM Patient Name: N. Patient Account Number: [id number] Medical Record [name], RN, [name], [place] Sink Treating RN: Number: 09/09/1981 Date of Birth/Sex: 09-29-1980 (34 y.o. Female) Other Clinician: Primary Care Physician: [name] Treating Tania Ade Referring Physician: 14/10/2014 Physician/Extender: 14/10/2014 in Treatment: 0 Edema Assessment Assessed: [Left: No] [Right: No] Edema: [Left: Yes] [Right: Yes] Vascular Assessment Claudication: Claudication Assessment [Left:None] [Right:None] Pulses: Posterior Tibial Palpable: [Left:No] [Right:No] Doppler: [Left:Multiphasic] [Right:Multiphasic] Dorsalis Pedis Palpable: [Left:No] [Right:No] Doppler: [Left:Multiphasic] [Right:Multiphasic] Extremity colors, hair growth, and conditions: Extremity Color: [Left:Normal] [Right:Normal] Hair Growth on Extremity:  [Left:Yes] [Right:Yes] Temperature of Extremity: [Left:Warm] [Right:Warm] Capillary Refill: [Left:< 3 seconds] [Right:< 3 seconds] Blood Pressure: Brachial: [Left:117] Toe Nail Assessment Left: Right: Thick: No No Discolored: No No Deformed: No No Improper Length and Hygiene: No No Notes Non compressible Electronic Signature(s) Signed: 08/30/2015 3:43:42 PM By: Gwenlyn Perking [name], RN [place], [name] ([id number]) Entered By: [name] on 08/30/2015 15:43:42 [id number] ([id number]) -------------------------------------------------------------------------------- Multi Wound Chart Details [place] Sink Date of Service: 08/30/2015 3:00 PM Patient Name: N. Patient Account Number: [id number] Medical Record [name], RN, [name], [name] Treating RN: Number: [name] Date of Birth/Sex: 1980/11/24 (34 y.o. 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] [11:the Lower Extremity] [12:the Lower Extremity] Comorbid History: [10:Anemia, Type I Diabetes, Anemia, Type I Diabetes, Anemia, Type I Diabetes, End Stage Renal Disease, End Stage Renal Disease, End Stage Renal Disease, Rheumatoid Arthritis, Neuropathy] [11:Rheumatoid Arthritis, Neuropathy]  [12:Rheumatoid Arthritis, Neuropathy] Date Acquired: [10:07/28/2015] [11:07/28/2015]  [12:07/28/2015] Weeks of Treatment: [10:0] [11:0] [12:0] Wound Status: [10:Open] [11:Open] [12:Open] Pending Amputation on Yes [11:Yes] [12:Yes] Presentation: Measurements L x W x D 4.5x9.5x0.1 [11:10.5x2.5x0.1] [12:10x5.5x0.1] (cm) Area (cm) : [10:33.576] [11:20.617] [12:43.197] Volume (cm) : [10:3.358] [11:2.062] [12:4.32] % Reduction in Area: [10:0.00%] [11:0.00%] [12:0.00%] % Reduction in Volume: 0.00% [11:0.00%] [12:0.00%] Classification: [10:Unable to visualize wound Unable to visualize wound Unable to visualize wound bed] [11:bed] [12:bed] Exudate Amount: [10:None Present] [11:None Present] [12:None Present] Exudate Type: [10:N/A] [11:N/A] [12:N/A] Exudate Color: [10:N/A] [11:N/A] [12:N/A] Wound Margin: [10:Distinct, outline attached Indistinct, nonvisible] [12:Indistinct, nonvisible] Granulation Amount: [10:None Present (0%)] [11:None Present (0%)] [12:None Present (0%)] Necrotic Amount: Large (67-100%) Large (67-100%) Large (67-100%) Necrotic Tissue: Eschar Eschar Eschar Exposed Structures: Fascia: No Fascia: No Fascia: No Fat: No Fat: No Fat: No Tendon: No Tendon: No Tendon: No Muscle: No Muscle: No Muscle: No Joint: No Joint: No Joint: No Bone: No Bone: No Bone: No Limited to Skin Limited to Skin Limited to Skin Breakdown Breakdown Breakdown Epithelialization: None None None Periwound Skin Texture: Edema: No Edema: No Edema: No Excoriation: No Excoriation: No Excoriation: No Induration: No Induration: No Induration: No Callus: No Callus: No Callus: No Crepitus: No Crepitus: No Crepitus: No Fluctuance: No Fluctuance: No Fluctuance: No Friable: No Friable: No Friable: No Rash: No Rash: No Rash: No Scarring: No Scarring: No Scarring: No Periwound Skin Dry/Scaly: Yes Dry/Scaly: Yes Dry/Scaly: Yes Moisture: Maceration: No Maceration: No Maceration: No Moist: No Moist: No Moist: No Periwound Skin Color: Atrophie Blanche: No Atrophie  Blanche: No Atrophie Blanche: No Cyanosis: No Cyanosis: No Cyanosis: No Ecchymosis: No Ecchymosis: No Ecchymosis: No Erythema: No Erythema: No Erythema: No Hemosiderin Staining: No Hemosiderin Staining: No Hemosiderin Staining: No Mottled: No Mottled: No Mottled: No Pallor: No Pallor: No Pallor: No Rubor: No Rubor: No Rubor: No Temperature: No Abnormality No Abnormality No Abnormality Tenderness on No No No Palpation: Wound Preparation: Ulcer Cleansing: Ulcer Cleansing: Ulcer Cleansing: Rinsed/Irrigated with Rinsed/Irrigated with Rinsed/Irrigated with Saline Saline Saline Topical Anesthetic Topical Anesthetic Topical Anesthetic Applied: None Applied: None Applied: None Wound Number: [id number]  Photos: No Photos No Photos No Photos Wound Location: Right Toe Third - Right Toe Great - Plantar, Left Calcaneous Circumfernential Circumfernential Wounding Event: Gradually Appeared Gradually Appeared Pressure Injury 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 ( ) Rheumatoid Arthritis, Rheumatoid Arthritis, Rheumatoid Arthritis, Neuropathy Neuropathy Neuropathy Date Acquired: 07/28/2015 07/28/2015 07/28/2015 Weeks of Treatment: 0 0 0 Wound Status: Open Open Open Pending Amputation on Yes Yes No Presentation: Measurements L x  W x D 10x2x0.1 14x5x0.1 4.3x5.5x0.1 (cm) Area (cm) : 15.708 54.978 18.575 Volume (cm) : 1.571 5.498 1.857 % Reduction in Area: 0.00% 0.00% N/A % Reduction in Volume: 0.00% 0.00% N/A Classification: Unable to visualize wound Unable to visualize wound Grade 2 bed bed Exudate Amount: None Present None Present Small Exudate Type: N/A N/A  Serosanguineous Exudate Color: N/A N/A red, brown Wound Margin: Indistinct, nonvisible Indistinct, nonvisible Distinct, outline attached Granulation Amount: None Present (0%) None Present (0%) Medium (34-66%) Necrotic Amount: Large (67-100%) Large (67-100%) Small (1-33%) Necrotic Tissue: N/A Eschar Adherent Slough Exposed Structures: Fascia: No Fascia: No Fascia: No Fat: No Fat: No Fat: No Tendon: No Tendon: No Tendon: No Muscle: No Muscle: No Muscle: No Joint: No Joint: No Joint: No Bone: No Bone: No Bone: No Limited to Skin Limited to Skin Limited to Skin Breakdown Breakdown Breakdown Epithelialization: None None None Periwound Skin Texture: Edema: No Edema: No Edema: No Excoriation: No Excoriation: No Excoriation: No Induration: No Induration: No Induration: No Callus: No Callus: No Callus: No Crepitus: No Crepitus: No Crepitus: No Fluctuance: No Fluctuance: No Fluctuance: No Friable: No Friable: No Friable: No Rash: No Rash: No Rash: No Scarring: No Scarring: No Scarring: No Periwound Skin Dry/Scaly: Yes Dry/Scaly: Yes Moist: Yes Moisture: Maceration: No Maceration: No Maceration: No Moist: No Moist: No Dry/Scaly: No Periwound Skin Color: Atrophie Blanche: No Atrophie Blanche: No Atrophie Blanche: No Cyanosis: No Cyanosis: No Cyanosis: No Ecchymosis: No Ecchymosis: No Ecchymosis: No Erythema: No Erythema: No Erythema: No Hemosiderin Staining: No Hemosiderin Staining: No Hemosiderin Staining: No Mottled: No Mottled: No Mottled: No [place], Criselda N. ([id number]) Pallor: No Pallor: No Pallor: No Rubor: No Rubor: No Rubor: No Temperature: No Abnormality No Abnormality No Abnormality Tenderness on No No Yes Palpation: Wound Preparation: Ulcer Cleansing: Ulcer Cleansing: Ulcer Cleansing: Rinsed/Irrigated with Rinsed/Irrigated with Rinsed/Irrigated with Saline Saline Saline Topical Anesthetic Topical Anesthetic Topical  Anesthetic Applied: None Applied: None Applied: Other: lidocaine 4% Treatment Notes Electronic Signature(s) Signed: 08/30/2015 4:32:41 PM By: Elpidio Eric [name], RN Entered By: Elpidio Eric on 08/30/2015 16:32:41 [name] ([id number]) -------------------------------------------------------------------------------- Multi-Disciplinary Care Plan Details [name] Date of Service: 08/30/2015 3:00 PM Patient Name: N. Patient Account Number: [id number] Medical Record [name], RN, [name], [id number] Treating RN: Number: [place] Sink Date of Birth/Sex: 05/20/1981 (34 y.o. Female) Other Clinician: Primary Care Physician: [name] Treating [name] Referring Physician: [name] Physician/Extender: Tania Ade in Treatment: 0 Active Inactive HBO Nursing Diagnoses: Anxiety related to feelings of confinement associated with the hyperbaric oxygen chamber Anxiety related to knowledge deficit of hyperbaric oxygen therapy and treatment procedures Discomfort related to temperature and humidity changes inside hyperbaric chamber Potential for barotraumas to ears, sinuses, teeth, and lungs or cerebral gas embolism related to changes in atmospheric pressure inside hyperbaric oxygen chamber Potential for oxygen toxicity seizures related to delivery of 100% oxygen at an increased atmospheric pressure Potential for pulmonary oxygen toxicity related to delivery of 100% oxygen at an increased atmospheric pressure Goals: Barotrauma will be prevented during HBO2 Date Initiated: 08/30/2015 Goal Status: Active Patient and/or family will be able to state/discuss factors appropriate to the management of their disease process during treatment Date Initiated: 08/30/2015 Goal Status: Active Patient will tolerate the hyperbaric oxygen therapy treatment Date Initiated: 08/30/2015 Goal Status: Active Patient will tolerate the internal climate of the chamber Date Initiated: 08/30/2015 Goal Status:  Active Patient/caregiver will verbalize understanding of HBO goals, rationale, procedures and potential hazards Date Initiated: 08/30/2015 Goal Status: Active Signs and symptoms of  pulmonary oxygen toxicity will be recognized and promptly addressed Date Initiated: 08/30/2015 Goal Status: Active TANAYSHA, MAREADY ([id number]) Signs and symptoms of seizure will be recognized and promptly addressed ; seizing patients will suffer no harm Date Initiated: 08/30/2015 Goal Status: Active Interventions: Administer a five (5) minute air break for patient if signs and symptoms of seizure appear and notify the hyperbaric physician Administer a ten (10) minute air break for patient if signs and symptoms of seizure appear and notify the hyperbaric physician Administer decongestants, per physician orders, prior to HBO2 Administer the correct therapeutic gas delivery based on the patients needs and limitations, per physician order Assess and provide for patientos comfort related to the hyperbaric environment and equalization of middle ear Assess for signs and symptoms related to adverse events, including but not limited to confinement anxiety, pneumothorax, oxygen toxicity and baurotrauma Assess patient for any history of confinement anxiety Assess patient's knowledge and expectations regarding hyperbaric medicine and provide education related to the hyperbaric environment, goals of treatment and prevention of adverse events Implement protocols to decrease risk of pneumothorax in high risk patients Notes: Orientation to the Wound Care Program Nursing Diagnoses: Knowledge deficit related to the wound healing center program Goals: Patient/caregiver will verbalize understanding of the Wound Healing Center Program Date Initiated: 08/30/2015 Goal Status: Active Interventions: Provide education on orientation to the wound center Notes: Pressure Nursing Diagnoses: Knowledge deficit related to causes  and risk factors for pressure ulcer development Knowledge deficit related to management of pressures ulcers Potential for impaired tissue integrity related to pressure, friction, moisture, and shear GoalsYOSELIN, PEED ([id number]) Patient will remain free from development of additional pressure ulcers Date Initiated: 08/30/2015 Goal Status: Active Patient will remain free of pressure ulcers Date Initiated: 08/30/2015 Goal Status: Active Patient/caregiver will verbalize risk factors for pressure ulcer development Date Initiated: 08/30/2015 Goal Status: Active Patient/caregiver will verbalize understanding of pressure ulcer management Date Initiated: 08/30/2015 Goal Status: Active Interventions: Assess: immobility, friction, shearing, incontinence upon admission and as needed Assess offloading mechanisms upon admission and as needed Assess potential for pressure ulcer upon admission and as needed Provide education on pressure ulcers Treatment Activities: Patient referred for home evaluation of offloading devices/mattresses : 08/30/2015 Patient referred for pressure reduction/relief devices : 08/30/2015 Pressure reduction/relief device ordered : 08/30/2015 Notes: Wound/Skin Impairment Nursing Diagnoses: Impaired tissue integrity Knowledge deficit related to ulceration/compromised skin integrity Goals: Patient will have a decrease in wound volume by X% from date: (specify in notes) Date Initiated: 08/30/2015 Goal Status: Active Patient/caregiver will verbalize understanding of skin care regimen Date Initiated: 08/30/2015 Goal Status: Active Ulcer/skin breakdown will have a volume reduction of 30% by week 4 Date Initiated: 08/30/2015 Goal Status: Active Ulcer/skin breakdown will have a volume reduction of 50% by week 8 Date Initiated: 08/30/2015 Goal Status: Active POETRY, SIS ([id number]) Ulcer/skin breakdown will have a volume reduction of 80% by week 12 Date  Initiated: 08/30/2015 Goal Status: Active Ulcer/skin breakdown will heal within 14 weeks Date Initiated: 08/30/2015 Goal Status: Active Interventions: Assess patient/caregiver ability to obtain necessary supplies Assess patient/caregiver ability to perform ulcer/skin care regimen upon admission and as needed Assess ulceration(s) every visit Provide education on ulcer and skin care Notes: Electronic Signature(s) Signed: 08/30/2015 4:32:26 PM By: Elpidio Eric [name], RN Entered By: Elpidio Eric on 08/30/2015 16:32:26 [name] ([id number]) -------------------------------------------------------------------------------- Pain Assessment Details [name] Date of Service: 08/30/2015 3:00 PM Patient Name: N. Patient Account Number: [id number] Medical Record [name], RN, [name], [id number] Treating RN: Number:  [place] Sink Date of Birth/Sex: [birth year] (34 y.o. Female) Other Clinician: Primary Care Physician: [name] Treating [name] Referring Physician: [name] Physician/Extender: Tania Ade in Treatment: 0 Active Problems Location of Pain Severity and Description of Pain Patient Has Paino Yes Site Locations Pain Location: Generalized Pain, Pain in Ulcers Pain Management and Medication Current Pain Management: How does your pain impact your activities of daily livingo Sleep: Yes Bathing: Yes Appetite: Yes Relationship With Others: Yes Bladder Continence: Yes Emotions: Yes Bowel Continence: Yes Work: Yes Toileting: Yes Drive: Yes Dressing: Yes Hobbies: Yes Electronic Signature(s) Signed: 08/30/2015 3:20:23 PM By: Elpidio Eric [name], RN Entered By: Elpidio Eric on 08/30/2015 15:20:23 [name] ([id number]) -------------------------------------------------------------------------------- Wound Assessment Details [name] Date of Service: 08/30/2015 3:00 PM Patient Name: N. Patient Account Number: [id number] Medical Record [name], RN,  [name], [id number] Treating RN: Number: [place] Sink Date of Birth/Sex: 1981/01/13 (34 y.o. Female) Other Clinician: Primary Care Physician: [name] Treating [name] Referring Physician: [name] Physician/Extender: Tania Ade in Treatment: 0 Wound Status Wound Number: 10 Primary Diabetic Wound/Ulcer of the Lower Etiology: Extremity Wound Location: Right Calcaneous Wound Open Wounding Event: Pressure Injury Status: Date Acquired: 07/28/2015 Comorbid Anemia, Type I Diabetes, End Stage Weeks Of Treatment: 0 History: Renal Disease, Rheumatoid Arthritis, Clustered Wound: No Neuropathy Pending Amputation On Presentation Wound Measurements Length: (cm) 4.5 Width: (cm) 9.5 Depth: (cm) 0.1 Area: (cm) 33.576 Volume: (cm) 3.358 % Reduction in Area: 0% % Reduction in Volume: 0% Epithelialization: None Tunneling: No Undermining: No Wound Description Classification: Unable to visualize wound bed Wound Margin: Distinct, outline attached Exudate Amount: None Present Foul Odor After Cleansing: No Wound Bed Granulation Amount: None Present (0%) Exposed Structure Necrotic Amount: Large (67-100%) Fascia Exposed: No Necrotic Quality: Eschar Fat Layer Exposed: No Tendon Exposed: No Muscle Exposed: No Joint Exposed: No Bone Exposed: No Limited to Skin Breakdown Periwound Skin Texture Texture Color No Abnormalities Noted: No No Abnormalities Noted: No Callus: No Atrophie Blanche: No Crepitus: No Cyanosis: No Excoriation: No Ecchymosis: No [place], Maryna N. ([id number]) Fluctuance: No Erythema: No Friable: No Hemosiderin Staining: No Induration: No Mottled: No Localized Edema: No Pallor: No Rash: No Rubor: No Scarring: No Temperature / Pain Moisture Temperature: No Abnormality No Abnormalities Noted: No Dry / Scaly: Yes Maceration: No Moist: No Wound Preparation Ulcer Cleansing: Rinsed/Irrigated with Saline Topical Anesthetic Applied: None Electronic  Signature(s) Signed: 08/30/2015 3:51:07 PM By: Elpidio Eric [name], RN Entered By: Elpidio Eric on 08/30/2015 15:51:06 [name] ([id number]) -------------------------------------------------------------------------------- Wound Assessment Details [name] Date of Service: 08/30/2015 3:00 PM Patient Name: N. Patient Account Number: [id number] Medical Record [name], RN, [name], [id number] Treating RN: Number:  Sink Date of Birth/Sex: 1981/07/01 (34 y.o. Female) Other Clinician: Primary Care Physician: [name] Treating [name] Referring Physician: [name] Physician/Extender: Tania Ade in Treatment: 0 Wound Status Wound Number: 11 Primary Diabetic Wound/Ulcer of the Lower Etiology: Extremity Wound Location: Left Toe Second - Circumfernential Wound Open Status: Wounding Event: Gradually Appeared Comorbid Anemia, Type I Diabetes, End Stage Date Acquired: 07/28/2015 History: Renal Disease, Rheumatoid Arthritis, Weeks Of Treatment: 0 Neuropathy Clustered Wound: No Wound Measurements Length: (cm) 10.5 Width: (cm) 2.5 Depth: (cm) 0.1 Area: (cm) 20.617 Volume: (cm) 2.062 % Reduction in Area: 0% % Reduction in Volume: 0% Epithelialization: None Tunneling: No Undermining: No Wound Description Classification: Unable to visualize wound bed Wound Margin: Indistinct, nonvisible Exudate Amount: None Present Foul Odor After Cleansing: No Wound Bed Granulation Amount: None Present (0%) Exposed Structure Necrotic Amount: Large (67-100%) Fascia Exposed: No  Necrotic Quality: Eschar Fat Layer Exposed: No Tendon Exposed: No Muscle Exposed: No Joint Exposed: No Bone Exposed: No Limited to Skin Breakdown Periwound Skin Texture Texture Color No Abnormalities Noted: No No Abnormalities Noted: No Callus: No Atrophie Blanche: No Crepitus: No Cyanosis: No Excoriation: No Ecchymosis: No Teas, Payzlee N. ([id number]) Fluctuance: No Erythema:  No Friable: No Hemosiderin Staining: No Induration: No Mottled: No Localized Edema: No Pallor: No Rash: No Rubor: No Scarring: No Temperature / Pain Moisture Temperature: No Abnormality No Abnormalities Noted: No Dry / Scaly: Yes Maceration: No Moist: No Wound Preparation Ulcer Cleansing: Rinsed/Irrigated with Saline Topical Anesthetic Applied: None Electronic Signature(s) Signed: 08/30/2015 3:53:25 PM By: Elpidio Eric [name], RN Entered By: Elpidio Eric on 08/30/2015 15:53:25 [name] ([id number]) -------------------------------------------------------------------------------- Wound Assessment Details [name] Date of Service: 08/30/2015 3:00 PM Patient Name: N. Patient Account Number: [id number] Medical Record [name], RN, [name], [id number] Treating RN: Number: [place] Sink Date of Birth/Sex: 10-25-1980 (34 y.o. Female) Other Clinician: Primary Care Physician: [name] Treating [name] Referring Physician: [name] Physician/Extender: Tania Ade in Treatment: 0 Wound Status Wound Number: 12 Primary Diabetic Wound/Ulcer of the Lower Etiology: Extremity Wound Location: Left Toe Great - Circumfernential Wound Open Status: Wounding Event: Gradually Appeared Comorbid Anemia, Type I Diabetes, End Stage Date Acquired: 07/28/2015 History: Renal Disease, Rheumatoid Arthritis, Weeks Of Treatment: 0 Neuropathy Clustered Wound: No Pending Amputation On Presentation Wound Measurements Length: (cm) 10 Width: (cm) 5.5 Depth: (cm) 0.1 Area: (cm) 43.197 Volume: (cm) 4.32 % Reduction in Area: 0% % Reduction in Volume: 0% Epithelialization: None Tunneling: No Undermining: No Wound Description Classification: Unable to visualize wound bed Wound Margin: Indistinct, nonvisible Exudate Amount: None Present Foul Odor After Cleansing: No Wound Bed Granulation Amount: None Present (0%) Exposed Structure Necrotic Amount: Large (67-100%) Fascia Exposed:  No Necrotic Quality: Eschar Fat Layer Exposed: No Tendon Exposed: No Muscle Exposed: No Joint Exposed: No Bone Exposed: No Limited to Skin Breakdown Periwound Skin Texture Texture Color No Abnormalities Noted: No No Abnormalities Noted: No Callus: No Atrophie Blanche: No Crepitus: No Cyanosis: No Lewellen, Titania N. ([id number]) Excoriation: No Ecchymosis: No Fluctuance: No Erythema: No Friable: No Hemosiderin Staining: No Induration: No Mottled: No Localized Edema: No Pallor: No Rash: No Rubor: No Scarring: No Temperature / Pain Moisture Temperature: No Abnormality No Abnormalities Noted: No Dry / Scaly: Yes Maceration: No Moist: No Wound Preparation Ulcer Cleansing: Rinsed/Irrigated with Saline Topical Anesthetic Applied: None Electronic Signature(s) Signed: 08/30/2015 3:54:59 PM By: Elpidio Eric [name], RN Entered By: Elpidio Eric on 08/30/2015 15:54:59 [name] ([id number]) -------------------------------------------------------------------------------- Wound Assessment Details [name] Date of Service: 08/30/2015 3:00 PM Patient Name: N. Patient Account Number: [id number] Medical Record [name], RN, [name], [id number] Treating RN: Number: [place] Sink Date of Birth/Sex: 03/12/1981 (34 y.o. Female) Other Clinician: Primary Care Physician: [name] Treating [name] Referring Physician: [name] Physician/Extender: Tania Ade in Treatment: 0 Wound Status Wound Number: 13 Primary Diabetic Wound/Ulcer of the Lower Etiology: Extremity Wound Location: Right Toe Third - Circumfernential Wound Open Status: Wounding Event: Gradually Appeared Comorbid Anemia, Type I Diabetes, End Stage Date Acquired: 07/28/2015 History: Renal Disease, Rheumatoid Arthritis, Weeks Of Treatment: 0 Neuropathy Clustered Wound: No Pending Amputation On Presentation Wound Measurements Length: (cm) 10 Width: (cm) 2 Depth: (cm) 0.1 Area: (cm) 15.708 Volume:  (cm) 1.571 % Reduction in Area: 0% % Reduction in Volume: 0% Epithelialization: None Tunneling: No Undermining: No Wound Description Classification: Unable to visualize wound bed Wound Margin: Indistinct, nonvisible Exudate Amount: None Present Foul Odor  After Cleansing: No Wound Bed Granulation Amount: None Present (0%) Exposed Structure Necrotic Amount: Large (67-100%) Fascia Exposed: No Fat Layer Exposed: No Tendon Exposed: No Muscle Exposed: No Joint Exposed: No Bone Exposed: No Limited to Skin Breakdown Periwound Skin Texture Texture Color No Abnormalities Noted: No No Abnormalities Noted: No Callus: No Atrophie Blanche: No Crepitus: No Cyanosis: No Loud, Jaiya N. ([id number]) Excoriation: No Ecchymosis: No Fluctuance: No Erythema: No Friable: No Hemosiderin Staining: No Induration: No Mottled: No Localized Edema: No Pallor: No Rash: No Rubor: No Scarring: No Temperature / Pain Moisture Temperature: No Abnormality No Abnormalities Noted: No Dry / Scaly: Yes Maceration: No Moist: No Wound Preparation Ulcer Cleansing: Rinsed/Irrigated with Saline Topical Anesthetic Applied: None Electronic Signature(s) Signed: 08/30/2015 3:56:43 PM By: Elpidio Eric [name], RN Entered By: Elpidio Eric on 08/30/2015 15:56:43 [name] ([id number]) -------------------------------------------------------------------------------- Wound Assessment Details [name] Date of Service: 08/30/2015 3:00 PM Patient Name: N. Patient Account Number: [id number] Medical Record [name], RN, [name], [id number] Treating RN: Number: [place] Sink Date of Birth/Sex: 1981-02-02 (34 y.o. Female) Other Clinician: Primary Care Physician: [name] Treating [name] Referring Physician: [name] Physician/Extender: Tania Ade in Treatment: 0 Wound Status Wound Number: 14 Primary Diabetic Wound/Ulcer of the Lower Etiology: Extremity Wound Location: Right Toe Great -  Plantar, Circumfernential Wound Open Status: Wounding Event: Gradually Appeared Comorbid Anemia, Type I Diabetes, End Stage Date Acquired: 07/28/2015 History: Renal Disease, Rheumatoid Arthritis, Weeks Of Treatment: 0 Neuropathy Clustered Wound: No Wound Measurements Length: (cm) 14 Width: (cm) 5 Depth: (cm) 0.1 Area: (cm) 54.978 Volume: (cm) 5.498 % Reduction in Area: 0% % Reduction in Volume: 0% Epithelialization: None Tunneling: No Undermining: No Wound Description Classification: Unable to visualize wound bed Wound Margin: Indistinct, nonvisible Exudate Amount: None Present Foul Odor After Cleansing: No Wound Bed Granulation Amount: None Present (0%) Exposed Structure Necrotic Amount: Large (67-100%) Fascia Exposed: No Necrotic Quality: Eschar Fat Layer Exposed: No Tendon Exposed: No Muscle Exposed: No Joint Exposed: No Bone Exposed: No Limited to Skin Breakdown Periwound Skin Texture Texture Color No Abnormalities Noted: No No Abnormalities Noted: No Callus: No Atrophie Blanche: No Crepitus: No Cyanosis: No Excoriation: No Ecchymosis: No Bonaparte, Mikki N. ([id number]) Fluctuance: No Erythema: No Friable: No Hemosiderin Staining: No Induration: No Mottled: No Localized Edema: No Pallor: No Rash: No Rubor: No Scarring: No Temperature / Pain Moisture Temperature: No Abnormality No Abnormalities Noted: No Dry / Scaly: Yes Maceration: No Moist: No Wound Preparation Ulcer Cleansing: Rinsed/Irrigated with Saline Topical Anesthetic Applied: None Electronic Signature(s) Signed: 08/30/2015 3:59:11 PM By: Elpidio Eric [name], RN Entered By: Elpidio Eric on 08/30/2015 15:59:11 [name] ([id number]) -------------------------------------------------------------------------------- Wound Assessment Details [name] Date of Service: 08/30/2015 3:00 PM Patient Name: N. Patient Account Number: [id number] Medical Record [name], RN,  [name], [id number] Treating RN: Number: [place] Sink Date of Birth/Sex: Apr 21, 1981 (34 y.o. Female) Other Clinician: Primary Care Physician: [name] Treating [name] Referring Physician: [name] Physician/Extender: Tania Ade in Treatment: 0 Wound Status Wound Number: 9 Primary Diabetic Wound/Ulcer of the Lower Etiology: Extremity Wound Location: Left Calcaneous Wound Open Wounding Event: Pressure Injury Status: Date Acquired: 07/28/2015 Comorbid Anemia, Type I Diabetes, End Stage Weeks Of Treatment: 0 History: Renal Disease, Rheumatoid Arthritis, Clustered Wound: No Neuropathy Wound Measurements Length: (cm) 4.3 Width: (cm) 5.5 Depth: (cm) 0.1 Area: (cm) 18.575 Volume: (cm) 1.857 % Reduction in Area: % Reduction in Volume: Epithelialization: None Tunneling: No Undermining: No Wound Description Classification: Grade 2 Wound Margin: Distinct, outline attached Exudate Amount: Small Exudate Type:  Serosanguineous Exudate Color: red, brown Foul Odor After Cleansing: No Wound Bed Granulation Amount: Medium (34-66%) Exposed Structure Necrotic Amount: Small (1-33%) Fascia Exposed: No Necrotic Quality: Adherent Slough Fat Layer Exposed: No Tendon Exposed: No Muscle Exposed: No Joint Exposed: No Bone Exposed: No Limited to Skin Breakdown Periwound Skin Texture Texture Color No Abnormalities Noted: No No Abnormalities Noted: No Callus: No Atrophie Blanche: No Boakye, Jamyia N. ([id number]) Crepitus: No Cyanosis: No Excoriation: No Ecchymosis: No Fluctuance: No Erythema: No Friable: No Hemosiderin Staining: No Induration: No Mottled: No Localized Edema: No Pallor: No Rash: No Rubor: No Scarring: No Temperature / Pain Moisture Temperature: No Abnormality No Abnormalities Noted: No Tenderness on Palpation: Yes Dry / Scaly: No Maceration: No Moist: Yes Wound Preparation Ulcer Cleansing: Rinsed/Irrigated with Saline Topical Anesthetic  Applied: Other: lidocaine 4%, Electronic Signature(s) Signed: 08/30/2015 4:37:38 PM By: Elpidio Eric [name], RN Entered By: Elpidio Eric on 08/30/2015 15:48:48 [name] ([id number]) -------------------------------------------------------------------------------- Vitals Details [name] Date of Service: 08/30/2015 3:00 PM Patient Name: N. Patient Account Number: [id number] Medical Record [name], RN, [name], [id number] Treating RN: Number: [place] Sink Date of Birth/Sex: 11-20-1980 (34 y.o. Female) Other Clinician: Primary Care Physician: [name] Treating [name] Referring Physician: [name] Physician/Extender: Tania Ade in Treatment: 0 Vital Signs Time Taken: 15:21 Temperature (F): 98.2 Pulse (bpm): 93 Respiratory Rate (breaths/min): 16 Blood Pressure (mmHg): 117/53 Reference Range: 80 - 120 mg / dl Electronic Signature(s) Signed: 08/30/2015 3:21:23 PM By: Elpidio Eric [name], RN Entered By: Elpidio Eric on 08/30/2015 15:21:23

## 2015-09-05 ENCOUNTER — Ambulatory Visit: Payer: Medicare Other | Admitting: Surgery

## 2015-09-06 ENCOUNTER — Ambulatory Visit: Payer: Medicare Other | Admitting: Surgery

## 2015-09-09 ENCOUNTER — Encounter: Payer: Medicare Other | Admitting: Surgery

## 2015-09-09 DIAGNOSIS — E10621 Type 1 diabetes mellitus with foot ulcer: Secondary | ICD-10-CM | POA: Diagnosis not present

## 2015-09-10 ENCOUNTER — Encounter: Payer: Medicare Other | Admitting: Surgery

## 2015-09-10 NOTE — Progress Notes (Addendum)
[name] ([id number]) Visit Report for 09/09/2015 Chief Complaint Document Details [name], [name] 09/09/2015 3:45 Patient Name: Date of Service: N. PM Medical Record Patient Account Number: [id number] Number: Treating RN: Phillis Haggis 09/28/1981 (34 y.o. Other Clinician: Date of Birth/Sex: Female) Treating Cleveland Paiz Primary Care Physician/Extender: [name] Physician: Referring Physician: [name] in Treatment: 1 Information Obtained from: Patient Chief Complaint Patient in today for treatment of non-healing wound and HBO Treatment. she has just gotten out of hospital this week and is back to resume her hyperbaric oxygen therapy Electronic Signature(s) Signed: 09/09/2015 4:58:54 PM By: [name] MD, FACS Entered By: [name] on 09/09/2015 16:58:54 [name] ([id number]) -------------------------------------------------------------------------------- HPI Details [name], [name] 09/09/2015 3:45 Patient Name: Date of Service: N. PM Medical Record Patient Account Number: [id number] Number: Treating RN: Phillis Haggis [birth year] (34 y.o. Other Clinician: Date of Birth/Sex: Female) Treating Nakeya Adinolfi Primary Care Physician/Extender: [name] Physician: Referring Physician: [name] in Treatment: 1 History of Present Illness Location: dry gangrene both feet and heels Quality: Patient reports No Pain. Severity: Patient states wound are getting worse. Duration: Patient has had the wound for > 28months prior to seeking treatment at the wound center Context: The wound appeared gradually over time Modifying Factors: she has been in and out of hospital over the last 2 months Associated Signs and Symptoms: Patient reports having difficulty standing for long periods. HPI Description: [name] is a 34 y.o. female who presents to our wound center, back in June 2016,  referred by her PCP Dr. Zada Finders for nonhealing ulcers on the lateral aspect of the right heel. Of note she has a history of type 1 diabetes mellitus that has been uncontrolled. Past medical history significant for type 1 diabetes mellitus not controlled, ankylosing spondylitis, anorexia nervosa, irritable bowel syndrome, chronic kidney disease, chronic diarrhea. she then developed gangrene of both feet due to severe peripheral vascular disease and also had gangrene of the tips of her fingers due to upper extremity vascular disease. She was being worked up by vascular surgery at [facility] and at [facility] and has had several procedures done there. She started with hyperbaric oxygen therapy and had a total of 40 treatments the last one being on 06/20/2015. After the initial treatment of hyperbaric oxygen therapy she started having ear problems and had ultimately to use myringotomy tubes and this was done bilaterally. Since then her ears have been doing fine. In late September, she had seen vascular and hand surgeons. since then she's been in [place] at the rec center for surgery involving extensive vascular procedures for the upper extremities. She was then at [facility] with GI bleeds both upper and lower and has been in and out of hospital for that. She has recently been out of hospital for the last week. 09/09/2015 -- she was unable to get here in time to start her hyperbaric oxygen today and hence is only here for a wound care visit. Electronic Signature(s) Signed: 09/09/2015 4:59:17 PM By: [name] MD, FACS Entered By: [name] on 09/09/2015 16:59:17 [name] ([id number]) -------------------------------------------------------------------------------- Physical Exam Details [name], [name] 09/09/2015 3:45 Patient Name: Date of Service: N. PM Medical Record Patient Account Number: [id number] Number: Treating RN: Phillis Haggis 02/01/1981 (34 y.o. Other  Clinician: Date of Birth/Sex: Female) Treating Earl Zellmer Primary Care Physician/Extender: [name] Physician: Referring Physician: [name] in Treatment: 1 Constitutional . Pulse regular. Respirations normal and unlabored. Afebrile. . Eyes Nonicteric. Reactive to  light. Ears, Nose, Mouth, and Throat Lips, teeth, and gums WNL.Marland Kitchen Moist mucosa without lesions . Neck supple and nontender. No palpable supraclavicular or cervical adenopathy. Normal sized without goiter. Respiratory WNL. No retractions.. Cardiovascular Pedal Pulses WNL. No clubbing, cyanosis or edema. Lymphatic No adneopathy. No adenopathy. No adenopathy. Musculoskeletal Adexa without tenderness or enlargement.. Digits and nails w/o clubbing, cyanosis, infection, petechiae, ischemia, or inflammatory conditions.. Integumentary (Hair, Skin) No suspicious lesions. No crepitus or fluctuance. No peri-wound warmth or erythema. No masses.Marland Kitchen Psychiatric Judgement and insight Intact.. No evidence of depression, anxiety, or agitation.. Notes The pressure ulcer on the left heel is looking worse and on the left forefoot between her first interdigital and second interdigital space she has got some wetness which I'm concerned may be getting infected. Her right foot and heel is mainly dry gangrene. Electronic Signature(s) Signed: 09/09/2015 5:00:22 PM By: [name] MD, FACS Entered By: [name] on 09/09/2015 17:00:22 [name] ([id number]) -------------------------------------------------------------------------------- Physician Orders Details [name], [name] 09/09/2015 3:45 Patient Name: Date of Service: N. PM Medical Record Patient Account Number: [id number] Number: Treating RN: Huel Coventry [birth year] (34 y.o. Other Clinician: Date of Birth/Sex: Female) Treating Pranav Lince Primary Care Physician/Extender: [name] Physician: Referring Physician: [name] in Treatment: 1 Verbal / Phone Orders: Yes Clinician: Huel Coventry Read Back and Verified: Yes Diagnosis Coding Wound Cleansing Wound #10 Right,Circumferential Calcaneous o Clean wound with Normal Saline. Wound #12 Left,Plantar,Circumferential Foot o Clean wound with Normal Saline. Wound #14 Right,Plantar,Circumferential Foot o Clean wound with Normal Saline. Wound #9 Left Calcaneous o Clean wound with Normal Saline. Primary Wound Dressing Wound #10 Right,Circumferential Calcaneous o Other: - paint with betadine Wound #12 Left,Plantar,Circumferential Foot o Aquacel Ag Wound #14 Right,Plantar,Circumferential Foot o Other: - paint with betadine Wound #9 Left Calcaneous o Aquacel Ag Secondary Dressing Wound #10 Right,Circumferential Calcaneous o Conform/Kerlix Wound #12 Left,Plantar,Circumferential Foot o Conform/Kerlix Scalf, Kailie N. ([id number]) Wound #14 Right,Plantar,Circumferential Foot o Conform/Kerlix Wound #9 Left Calcaneous o Conform/Kerlix Dressing Change Frequency Wound #10 Right,Circumferential Calcaneous o Change dressing every other day. Wound #12 Left,Plantar,Circumferential Foot o Change dressing every other day. Wound #14 Right,Plantar,Circumferential Foot o Change dressing every other day. Wound #9 Left Calcaneous o Change dressing every other day. Follow-up Appointments Wound #10 Right,Circumferential Calcaneous o Return Appointment in 1 week. Wound #12 Left,Plantar,Circumferential Foot o Return Appointment in 1 week. Wound #14 Right,Plantar,Circumferential Foot o Return Appointment in 1 week. Wound #9 Left Calcaneous o Return Appointment in 1 week. Hyperbaric Oxygen Therapy Wound #10 Right,Circumferential Calcaneous o Evaluate for HBO Therapy o Indication: - Wagner 4 o If appropriate for treatment, begin HBOT per protocol: o 2.0 ATA for 90 Minutes without Air Breaks o One  treatment per day (delivered Monday through Friday unless otherwise specified in Special Instructions below): o Total # of Treatments: - 40 o Finger stick Blood Glucose Pre- and Post- HBOT Treatment. o Follow Hyperbaric Oxygen Glycemia Protocol Wound #12 Left,Plantar,Circumferential Foot o Evaluate for HBO Therapy o Indication: [address] [place], Kameo N. ([id number]) o If appropriate for treatment, begin HBOT per protocol: o 2.0 ATA for 90 Minutes without Air Breaks o One treatment per day (delivered Monday through Friday unless otherwise specified in Special Instructions below): o Total # of Treatments: - 40 o Finger stick Blood Glucose Pre- and Post- HBOT Treatment. o Follow Hyperbaric Oxygen Glycemia Protocol Wound #14 Right,Plantar,Circumferential Foot o Evaluate for HBO Therapy o Indication: - Wagner 4 o If appropriate for treatment, begin HBOT per protocol:   o 2.0 ATA for 90 Minutes without Air Breaks o One treatment per day (delivered Monday through Friday unless otherwise specified in Special Instructions below): o Total # of Treatments: - 40 o Finger stick Blood Glucose Pre- and Post- HBOT Treatment. o Follow Hyperbaric Oxygen Glycemia Protocol Wound #9 Left Calcaneous o Evaluate for HBO Therapy o Indication: - Wagner 4 o If appropriate for treatment, begin HBOT per protocol: o 2.0 ATA for 90 Minutes without Air Breaks o One treatment per day (delivered Monday through Friday unless otherwise specified in Special Instructions below): o Total # of Treatments: - 40 o Finger stick Blood Glucose Pre- and Post- HBOT Treatment. o Follow Hyperbaric Oxygen Glycemia Protocol GLYCEMIA INTERVENTIONS PROTOCOL PRE-HBO GLYCEMIA INTERVENTIONS ACTION INTERVENTION Obtain pre-HBO capillary blood 1 glucose (ensure physician order is in chart). A. Notify HBO physician and await physician orders. 2 If result is 70 mg/dl or  below: B. If the result meets the hospital definition of a critical result, follow hospital policy. If result is 71 mg/dl to 416 mg/dl: A. Give patient an 8 ounce Glucerna Shake, an 8 ounce Ensure, or 8 ounces of a Glucerna/Ensure equivalent dietary supplement*. B. Wait 30 minutes. Abella, Nahomy N. ([id number]) C. Retest patientos capillary blood glucose (CBG). D. If result greater than or equal to 110 mg/dl, proceed with HBO. If result less than 110 mg/dl, notify HBO physician and consider holding HBO. If result is 131 mg/dl to 032 mg/dl: A. Proceed with HBO. A. Notify HBO physician and await physician orders. B. It is recommended to hold HBO and do blood/urine ketone If result is 250 mg/dl or greater: testing. C. If the result meets the hospital definition of a critical result, follow hospital policy. POST-HBO GLYCEMIA INTERVENTIONS ACTION INTERVENTION Obtain post HBO capillary blood 1 glucose (ensure physician order is in chart). A. Notify HBO physician and await physician orders. 2 If result is 70 mg/dl or below: B. If the result meets the hospital definition of a critical result, follow hospital policy. A. Give patient an 8 ounce Glucerna Shake, an 8 ounce Ensure, or 8 ounces of a Glucerna/Ensure equivalent dietary supplement*. B. Wait 15 minutes for symptoms of hypoglycemia (i.e. nervousness, anxiety, If result is 71 mg/dl to 122 mg/dl: sweating, chills, clamminess, irritability, confusion, tachycardia or dizziness). C. If patient asymptomatic, discharge patient. If patient symptomatic, repeat capillary blood glucose (CBG) and notify HBO physician. If result is 101 mg/dl to 482 mg/dl: A. Discharge patient. A. Notify HBO physician and await physician orders. B. It is recommended to do If result is 250 mg/dl or greater: blood/urine ketone testing. C. If the result meets the hospital definition of a critical result, follow hospital  policy. SUELLEN, DUROCHER N. ([id number]) *Juice or candies are NOT equivalent products. If patient refuses the Glucerna or Ensure, please consult the hospital dietitian for an appropriate substitute. Electronic Signature(s) Signed: 09/10/2015 2:14:04 PM By: [name] MD, FACS Signed: 09/10/2015 4:39:11 PM By: Elliot Gurney RN, [name], Kim RN, [name] Previous Signature: 09/09/2015 5:03:57 PM Version By: [name] MD, FACS Previous Signature: 09/09/2015 6:01:17 PM Version By: Elliot Gurney RN, [name], Kim RN, [name] Entered By: Elliot Gurney, RN, [name], Kim on 09/10/2015 11:27:50 SHAWNDREA, RUTKOWSKI ([id number]) -------------------------------------------------------------------------------- Problem List Details KYNLI, CHOU 09/09/2015 3:45 Patient Name: Date of Service: N. PM Medical Record Patient Account Number: [id number] Number: Treating RN: Phillis Haggis 04-25-1981 (34 y.o. Other Clinician: Date of Birth/Sex: Female) Treating Malacki Mcphearson Primary Care Physician/Extender: [name] Physician: Referring Physician: [name] in Treatment: 1  Active Problems ICD-10 Encounter Code Description Active Date Diagnosis E10.621 Type 1 diabetes mellitus with foot ulcer 08/30/2015 Yes E10.52 Type 1 diabetes mellitus with diabetic peripheral 08/30/2015 Yes angiopathy with gangrene I70.245 Atherosclerosis of native arteries of left leg with ulceration 08/30/2015 Yes of other part of foot I70.261 Atherosclerosis of native arteries of extremities with 08/30/2015 Yes gangrene, right leg L89.622 Pressure ulcer of left heel, stage 2 08/30/2015 Yes Inactive Problems Resolved Problems Electronic Signature(s) Signed: 09/09/2015 4:58:47 PM By: [name] MD, FACS Entered By: [name] on 09/09/2015 16:58:47 [name] ([id number]) -------------------------------------------------------------------------------- Progress Note Details [name], [name] 09/09/2015  3:45 Patient Name: Date of Service: N. PM Medical Record Patient Account Number: [id number] Number: Treating RN: Phillis Haggis 06/08/1981 (34 y.o. Other Clinician: Date of Birth/Sex: Female) Treating Noah Lembke Primary Care Physician/Extender: [name] Physician: Referring Physician: [name] in Treatment: 1 Subjective Chief Complaint Information obtained from Patient Patient in today for treatment of non-healing wound and HBO Treatment. she has just gotten out of hospital this week and is back to resume her hyperbaric oxygen therapy History of Present Illness (HPI) The following HPI elements were documented for the patient's wound: Location: dry gangrene both feet and heels Quality: Patient reports No Pain. Severity: Patient states wound are getting worse. Duration: Patient has had the wound for > 9months prior to seeking treatment at the wound center Context: The wound appeared gradually over time Modifying Factors: she has been in and out of hospital over the last 2 months Associated Signs and Symptoms: Patient reports having difficulty standing for long periods. Hind Chesler is a 34 y.o. female who presents to our wound center, back in June 2016, referred by her PCP Dr. Zada Finders for nonhealing ulcers on the lateral aspect of the right heel. Of note she has a history of type 1 diabetes mellitus that has been uncontrolled. Past medical history significant for type 1 diabetes mellitus not controlled, ankylosing spondylitis, anorexia nervosa, irritable bowel syndrome, chronic kidney disease, chronic diarrhea. she then developed gangrene of both feet due to severe peripheral vascular disease and also had gangrene of the tips of her fingers due to upper extremity vascular disease. She was being worked up by vascular surgery at [facility] and at [facility] and has had several procedures done there. She started with hyperbaric oxygen therapy and had a total  of 40 treatments the last one being on 06/20/2015. After the initial treatment of hyperbaric oxygen therapy she started having ear problems and had ultimately to use myringotomy tubes and this was done bilaterally. Since then her ears have been doing fine. In late September, she had seen vascular and hand surgeons. since then she's been in [place] at the rec center for surgery involving extensive vascular procedures for the upper extremities. She was then at [facility] with GI bleeds both upper and lower and has been in and out of hospital for that. She has recently been out of hospital for the last week. 09/09/2015 -- she was unable to get here in time to start her hyperbaric oxygen today and hence is only here for a wound care visit. [name], [name] ([id number]) Objective Constitutional Pulse regular. Respirations normal and unlabored. Afebrile. Vitals Time Taken: 4:31 AM, Pulse: 84 bpm, Respiratory Rate: 18 breaths/min, Blood Pressure: 101/60 mmHg, Capillary Blood Glucose: 193 mg/dl. Eyes Nonicteric. Reactive to light. Ears, Nose, Mouth, and Throat Lips, teeth, and gums WNL.Marland Kitchen Moist mucosa without lesions . Neck supple and nontender. No palpable supraclavicular or cervical  adenopathy. Normal sized without goiter. Respiratory WNL. No retractions.. Cardiovascular Pedal Pulses WNL. No clubbing, cyanosis or edema. Lymphatic No adneopathy. No adenopathy. No adenopathy. Musculoskeletal Adexa without tenderness or enlargement.. Digits and nails w/o clubbing, cyanosis, infection, petechiae, ischemia, or inflammatory conditions.Marland Kitchen Psychiatric Judgement and insight Intact.. No evidence of depression, anxiety, or agitation.. General Notes: The pressure ulcer on the left heel is looking worse and on the left forefoot between her first interdigital and second interdigital space she has got some wetness which I'm concerned may be getting infected. Her right foot and heel is mainly dry  gangrene. Integumentary (Hair, Skin) No suspicious lesions. No crepitus or fluctuance. No peri-wound warmth or erythema. No masses.. Wound #10 status is Open. Original cause of wound was Pressure Injury. The wound is located on the Right,Circumferential Calcaneous. The wound measures 6.5cm length x 8cm width x 0.1cm depth; Huseby, Alira N. ([id number]) 40.841cm^2 area and 4.084cm^3 volume. The wound is limited to skin breakdown. There is a none present amount of drainage noted. The wound margin is distinct with the outline attached to the wound base. There is no granulation within the wound bed. There is a large (67-100%) amount of necrotic tissue within the wound bed including Eschar. The periwound skin appearance exhibited: Dry/Scaly. The periwound skin appearance did not exhibit: Callus, Crepitus, Excoriation, Fluctuance, Friable, Induration, Localized Edema, Rash, Scarring, Maceration, Moist, Atrophie Blanche, Cyanosis, Ecchymosis, Hemosiderin Staining, Mottled, Pallor, Rubor, Erythema. Periwound temperature was noted as No Abnormality. Wound #12 status is Open. Original cause of wound was Gradually Appeared. The wound is located on the Left,Plantar,Circumferential Foot. The wound measures 11cm length x 6.3cm width x 0.1cm depth; 54.428cm^2 area and 5.443cm^3 volume. The wound is limited to skin breakdown. There is a medium amount of serous drainage noted. The wound margin is indistinct and nonvisible. There is no granulation within the wound bed. There is a large (67-100%) amount of necrotic tissue within the wound bed including Eschar. The periwound skin appearance exhibited: Moist. The periwound skin appearance did not exhibit: Callus, Crepitus, Excoriation, Fluctuance, Friable, Induration, Localized Edema, Rash, Scarring, Dry/Scaly, Maceration, Atrophie Blanche, Cyanosis, Ecchymosis, Hemosiderin Staining, Mottled, Pallor, Rubor, Erythema. Periwound temperature was noted as No  Abnormality. General Notes: Converted wounds to combine area: Great toe and second toe along with plantar surface is all necrotic tissue. Wound #14 status is Open. Original cause of wound was Gradually Appeared. The wound is located on the Right,Plantar,Circumferential Foot. The wound measures 10cm length x 7.5cm width x 0.1cm depth; 58.905cm^2 area and 5.89cm^3 volume. The wound is limited to skin breakdown. There is a none present amount of drainage noted. The wound margin is indistinct and nonvisible. There is no granulation within the wound bed. There is a large (67-100%) amount of necrotic tissue within the wound bed including Eschar. The periwound skin appearance exhibited: Dry/Scaly. The periwound skin appearance did not exhibit: Callus, Crepitus, Excoriation, Fluctuance, Friable, Induration, Localized Edema, Rash, Scarring, Maceration, Moist, Atrophie Blanche, Cyanosis, Ecchymosis, Hemosiderin Staining, Mottled, Pallor, Rubor, Erythema. Periwound temperature was noted as No Abnormality. General Notes: Converted wounds to combine area: Great toe, third and forth toes along with plantar surface is all necrotic tissue. Wound #9 status is Open. Original cause of wound was Pressure Injury. The wound is located on the Left Calcaneous. The wound measures 3.5cm length x 6cm width x 0.1cm depth; 16.493cm^2 area and 1.649cm^3 volume. The wound is limited to skin breakdown. There is a medium amount of serosanguineous drainage noted. The wound margin is distinct  with the outline attached to the wound base. There is no granulation within the wound bed. There is a large (67-100%) amount of necrotic tissue within the wound bed including Eschar and Adherent Slough. The periwound skin appearance exhibited: Moist. The periwound skin appearance did not exhibit: Callus, Crepitus, Excoriation, Fluctuance, Friable, Induration, Localized Edema, Rash, Scarring, Dry/Scaly, Maceration, Atrophie Blanche,  Cyanosis, Ecchymosis, Hemosiderin Staining, Mottled, Pallor, Rubor, Erythema. Periwound temperature was noted as No Abnormality. The periwound has tenderness on palpation. Assessment Active Problems [name], [name] ([id number]) ICD-10 E10.621 - Type 1 diabetes mellitus with foot ulcer E10.52 - Type 1 diabetes mellitus with diabetic peripheral angiopathy with gangrene I70.245 - Atherosclerosis of native arteries of left leg with ulceration of other part of foot I70.261 - Atherosclerosis of native arteries of extremities with gangrene, right leg J19.147 - Pressure ulcer of left heel, stage 2 As far as local care goes I have recommended all the dry areas to be painted with Betadine and the left forefoot in the interdigital spaces to apply some Aquacel AG and also use some Aquacel Ag for her left heel with heel protectors. I have requested the nephrologist to put her on some IV antibiotics during her dialysis as this may help control the possible infection in her left forefoot. I would be happy to take a call from him tomorrow while she is at the dialysis center. Plan Wound Cleansing: Wound #10 Right,Circumferential Calcaneous: Clean wound with Normal Saline. Wound #12 Left,Plantar,Circumferential Foot: Clean wound with Normal Saline. Wound #14 Right,Plantar,Circumferential Foot: Clean wound with Normal Saline. Wound #9 Left Calcaneous: Clean wound with Normal Saline. Primary Wound Dressing: Wound #10 Right,Circumferential Calcaneous: Other: - paint with betadine Wound #12 Left,Plantar,Circumferential Foot: Aquacel Ag Wound #14 Right,Plantar,Circumferential Foot: Other: - paint with betadine Wound #9 Left Calcaneous: Aquacel Ag Secondary Dressing: Wound #10 Right,Circumferential Calcaneous: Conform/Kerlix Wound #12 Left,Plantar,Circumferential Foot: Conform/Kerlix Wound #14 Right,Plantar,Circumferential Foot: Conform/Kerlix Wound #9 Left  Calcaneous: Conform/Kerlix [name], [name] ([id number]) Dressing Change Frequency: Wound #10 Right,Circumferential Calcaneous: Change dressing every other day. Wound #12 Left,Plantar,Circumferential Foot: Change dressing every other day. Wound #14 Right,Plantar,Circumferential Foot: Change dressing every other day. Wound #9 Left Calcaneous: Change dressing every other day. Follow-up Appointments: Wound #10 Right,Circumferential Calcaneous: Return Appointment in 1 week. Wound #12 Left,Plantar,Circumferential Foot: Return Appointment in 1 week. Wound #14 Right,Plantar,Circumferential Foot: Return Appointment in 1 week. Wound #9 Left Calcaneous: Return Appointment in 1 week. Hyperbaric Oxygen Therapy: Wound #10 Right,Circumferential Calcaneous: Evaluate for HBO Therapy Indication: - Wagner 4 If appropriate for treatment, begin HBOT per protocol: 2.0 ATA for 90 Minutes without Air Breaks One treatment per day (delivered Monday through Friday unless otherwise specified in Special Instructions below): Total # of Treatments: - 40 Finger stick Blood Glucose Pre- and Post- HBOT Treatment. Follow Hyperbaric Oxygen Glycemia Protocol Wound #12 Left,Plantar,Circumferential Foot: Evaluate for HBO Therapy Indication: - [place] 4 If appropriate for treatment, begin HBOT per protocol: 2.0 ATA for 90 Minutes without Air Breaks One treatment per day (delivered Monday through Friday unless otherwise specified in Special Instructions below): Total # of Treatments: - 40 Finger stick Blood Glucose Pre- and Post- HBOT Treatment. Follow Hyperbaric Oxygen Glycemia Protocol Wound #14 Right,Plantar,Circumferential Foot: Evaluate for HBO Therapy Indication: - [place] 4 If appropriate for treatment, begin HBOT per protocol: 2.0 ATA for 90 Minutes without Air Breaks One treatment per day (delivered Monday through Friday unless otherwise specified in Special Instructions below): Total # of  Treatments: - 40 Finger stick Blood Glucose Pre- and Post-  HBOT Treatment. Follow Hyperbaric Oxygen Glycemia Protocol Wound #9 Left Calcaneous: Evaluate for HBO Therapy ARASELY, AKKERMAN ([id number]) Indication: [address] 4 If appropriate for treatment, begin HBOT per protocol: 2.0 ATA for 90 Minutes without Air Breaks One treatment per day (delivered Monday through Friday unless otherwise specified in Special Instructions below): Total # of Treatments: - 40 Finger stick Blood Glucose Pre- and Post- HBOT Treatment. Follow Hyperbaric Oxygen Glycemia Protocol As far as local care goes I have recommended all the dry areas to be painted with Betadine and the left forefoot in the interdigital spaces to apply some Aquacel AG and also use some Aquacel Ag for her left heel with heel protectors. I have requested the nephrologist to put her on some IV antibiotics during her dialysis as this may help control the possible infection in her left forefoot. I would be happy to take a call from him tomorrow while she is at the dialysis center. If she is able to get away from her other appointment she will be off for hyperbaric oxygen therapy tomorrow. Electronic Signature(s) Signed: 09/10/2015 3:01:38 PM By: [name] MD, FACS Previous Signature: 09/09/2015 5:03:03 PM Version By: [name] MD, FACS Entered By: [name] on 09/10/2015 15:01:38 [name] ([id number]) -------------------------------------------------------------------------------- SuperBill Details Patient Name: [name] Date of Service: 09/09/2015 Medical Record Number: [id number] Patient Account Number: [id number] Date of Birth/Sex: 06-28-1981 (34 y.o. 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 Pressure ulcer of left heel, stage 2 Facility Procedures CPT4 Code: [id number] Description: 84132 - WOUND CARE VISIT-LEV 5 EST PT Modifier: Quantity: 1 Physician Procedures CPT4 Code Description: [id number] - WC PHYS LEVEL 3 - EST PT ICD-10 Description Diagnosis E10.621 Type 1 diabetes mellitus with foot ulcer E10.52 Type 1 diabetes mellitus with diabetic peripheral angio I70.261 Atherosclerosis of native arteries of  extremities with O53.664 Pressure ulcer of left heel, stage 2 Modifier: pathy with gangre gangrene, right l Quantity: 1 ne eg Electronic Signature(s) Signed: 09/09/2015 6:01:17 PM By: Elliot Gurney, RN, [name], Kim RN, [name] Signed: 09/10/2015 2:14:04 PM By: [name] MD, FACS Previous Signature: 09/09/2015 5:03:24 PM Version By: [name] MD, FACS Entered By: Elliot Gurney RN, [name], Kim on 09/09/2015 17:26:36

## 2015-09-10 NOTE — Progress Notes (Signed)
[name], PIETRI ([id number]) Visit Report for 09/09/2015 Arrival Information Details [name], MOWDY 09/09/2015 3:45 Patient Name: Date of Service: N. PM Medical Record Patient Account Number: [id number] Number: Treating RN: Huel Coventry Date of Birth/Sex: 05/08/1981 (34 y.o. Female) Other Clinician: Primary Care Physician: [name] [name] Referring Physician: Rolin Wu Physician/Extender: Tania Ade in Treatment: 1 Visit Information History Since Last Visit Hospitalized since last visit: No Patient Arrived: Wheel Chair Pain Present Now: Unable to Respond Arrival Time: 16:17 Accompanied By: mom Transfer Assistance: Manual Patient Identification Verified: Yes Secondary Verification Process Yes Completed: Patient Requires Transmission-Based No Precautions: Patient Has Alerts: Yes Patient Alerts: ABI:[place] bilateral Electronic Signature(s) Signed: 09/09/2015 6:01:17 PM By: Elliot Gurney, RN, [name], Kim RN, [name] Entered By: Elliot Gurney, RN, [name], Kim on 09/09/2015 16:29:16 [name] ([id number]) -------------------------------------------------------------------------------- Clinic Level of Care Assessment Details JAISLEY, SYNNOTT 09/09/2015 3:45 Patient Name: Date of Service: N. PM Medical Record Patient Account Number: [id number] Number: Treating RN: Huel Coventry Date of Birth/Sex: 12-26-1980 (34 y.o. Female) Other Clinician: Primary Care Physician: [name] [name] Referring Physician: Rolin Wu Physician/Extender: Tania Ade in Treatment: 1 Clinic Level of Care Assessment Items TOOL 4 Quantity Score []  - Use when only an EandM is performed on FOLLOW-UP visit 0 ASSESSMENTS - Nursing Assessment / Reassessment X - Reassessment of Co-morbidities (includes updates in patient status) 1 10 X - Reassessment of Adherence to Treatment Plan 1 5 ASSESSMENTS - Wound and Skin Assessment / Reassessment []  - Simple  Wound Assessment / Reassessment - one wound 0 X - Complex Wound Assessment / Reassessment - multiple wounds 1 5 []  - Dermatologic / Skin Assessment (not related to wound area) 0 ASSESSMENTS - Focused Assessment []  - Circumferential Edema Measurements - multi extremities 0 []  - Nutritional Assessment / Counseling / Intervention 0 []  - Lower Extremity Assessment (monofilament, tuning fork, pulses) 0 []  - Peripheral Arterial Disease Assessment (using hand held doppler) 0 ASSESSMENTS - Ostomy and/or Continence Assessment and Care []  - Incontinence Assessment and Management 0 []  - Ostomy Care Assessment and Management (repouching, etc.) 0 PROCESS - Coordination of Care []  - Simple Patient / Family Education for ongoing care 0 X - Complex (extensive) Patient / Family Education for ongoing care 1 20 []  - Staff obtains Chiropractor, Records, Test Results / Process Orders 0 []  - Staff telephones HHA, Nursing Homes / Clarify orders / etc 0 Averhart, Kaylon N. ([id number]) []  - Routine Transfer to another Facility (non-emergent condition) 0 []  - Routine Hospital Admission (non-emergent condition) 0 []  - New Admissions / Manufacturing engineer / Ordering NPWT, Apligraf, etc. 0 []  - Emergency Hospital Admission (emergent condition) 0 X - Simple Discharge Coordination 1 10 []  - Complex (extensive) Discharge Coordination 0 PROCESS - Special Needs []  - Pediatric / Minor Patient Management 0 []  - Isolation Patient Management 0 []  - Hearing / Language / Visual special needs 0 []  - Assessment of Community assistance (transportation, D/C planning, etc.) 0 []  - Additional assistance / Altered mentation 0 []  - Support Surface(s) Assessment (bed, cushion, seat, etc.) 0 INTERVENTIONS - Wound Cleansing / Measurement []  - Simple Wound Cleansing - one wound 0 X - Complex Wound Cleansing - multiple wounds 4 5 X - Wound Imaging (photographs - any number of wounds) 1 5 []  - Wound Tracing (instead of photographs)  0 []  - Simple Wound Measurement - one wound 0 X - Complex Wound Measurement - multiple wounds 4 5 INTERVENTIONS - Wound Dressings []  - Small Wound Dressing one  or multiple wounds 0 X - Medium Wound Dressing one or multiple wounds 4 15 []  - Large Wound Dressing one or multiple wounds 0 []  - Application of Medications - topical 0 []  - Application of Medications - injection 0 Bigford, Jeremiah N. ( ) INTERVENTIONS - Miscellaneous []  - External ear exam 0 []  - Specimen Collection (cultures, biopsies, blood, body fluids, etc.) 0 []  - Specimen(s) / Culture(s) sent or taken to Lab for analysis 0 []  - Patient Transfer (multiple staff / Lift / Similar devices) 0 []  - Simple Staple / Suture removal (25 or less) 0 []  - Complex Staple / Suture removal (26 or more) 0 []  - Hypo / Hyperglycemic Management (close monitor of Blood Glucose) 0 []  - Ankle / Brachial Index (ABI) - do not check if billed separately 0 X - Vital Signs 1 5 Has the patient been seen at the hospital within the last three years: Yes Total Score: 160 Level Of Care: New/Established - Level 5 Electronic Signature(s) Signed: 09/09/2015 6:01:17 PM By: [id number], RN, [name], Kim RN, [name] Entered By: , RN, [name], Kim on 09/09/2015 17:04:11 ( ) -------------------------------------------------------------------------------- Encounter Discharge Information Details BERNECE, GALL 09/09/2015 3:45 Patient Name: Date of Service: N. PM Medical Record Patient Account Number:  Number: Treating RN: Date of Birth/Sex: 05/16/1981 (34 y.o. Female) Other Clinician: Primary Care Physician: Elliot Gurney Treating 14/08/2015 Referring Physician: Diana Wu Physician/Extender: [id number] in Treatment: 1 Encounter Discharge Information Items Discharge Pain Level: 0 Discharge Condition: Stable Ambulatory Status: Wheelchair Discharge Destination:  Home Transportation: Private Auto Accompanied By: mom Schedule Follow-up Appointment: Yes Medication Reconciliation completed and provided to Patient/Care Yes Nashla Althoff: Provided on Clinical Summary of Care: 09/09/2015 Form Type Recipient Paper Patient HB Electronic Signature(s) Signed: 09/09/2015 5:06:15 PM By: [id number] Entered By: [id number] on 09/09/2015 17:06:15 [birth year]) -------------------------------------------------------------------------------- Lower Extremity Assessment Details GRAYSEN, DEPAULA 09/09/2015 3:45 Patient Name: Date of Service: N. PM Medical Record Patient Account Number: [name] Tania Ade Number: Treating RN: 14/08/2015 Date of Birth/Sex: [birth year] (34 y.o. Female) Other Clinician: Primary Care Physician: Gwenlyn Perking Treating 14/08/2015 Referring Physician: Diana Wu Physician/Extender: [id number] in Treatment: 1 Vascular Assessment Pulses: Posterior Tibial Dorsalis Pedis Palpable: [Left:Yes] [Right:Yes] Extremity colors, hair growth, and conditions: Extremity Color: [Left:Normal] [Right:Normal] Hair Growth on Extremity: [Left:Yes] [Right:Yes] Temperature of Extremity: [Left:Warm] [Right:Warm] Capillary Refill: [Left:> 3 seconds] [Right:> 3 seconds] Toe Nail Assessment Left: Right: Thick: No No Discolored: No No Deformed: No No Improper Length and Hygiene: No No Electronic Signature(s) Signed: 09/09/2015 6:01:17 PM By: 14/08/2015, RN, [name], Kim RN, [name] Entered By: [id number], RN, [name], Kim on 09/09/2015 16:32:25 Huel Coventry (09/09/1981) -------------------------------------------------------------------------------- Multi Wound Chart Details TERISHA, LOSASSO 09/09/2015 3:45 Patient Name: Date of Service: N. PM Medical Record Patient Account Number: [name] [name] Number: Treating RN: Tania Ade Date of Birth/Sex: Sep 29, 1980 (34 y.o. Female) Other Clinician: Primary Care Physician:  Elliot Gurney Treating 14/08/2015 Referring Physician: Diana Wu Physician/Extender: [id number] in Treatment: 1 Vital Signs Height(in): Capillary Blood 193 Glucose(mg/dl): Weight(lbs): Pulse(bpm): 84 Body Mass Index(BMI): Blood Pressure Temperature(F): 101/60 (mmHg): Respiratory Rate 18 (breaths/min): Photos: [10:No Photos] [12:No Photos] [14:No Photos] Wound Location: [10:Right Calcaneous - Circumfernential] [12:Left Foot - Plantar, Circumfernential] [14:Right Foot - Plantar, Circumfernential] Wounding Event: [10:Pressure Injury] [12:Gradually Appeared] [14:Gradually Appeared] 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] [12:the Lower Extremity] [14:the Lower Extremity] Comorbid History: [10:Anemia, Type I Diabetes, Anemia, Type I Diabetes, Anemia, Type I  Diabetes, End Stage Renal Disease, End Stage Renal Disease, End Stage Renal Disease, Rheumatoid Arthritis, Neuropathy] [12:Rheumatoid Arthritis, Neuropathy]  [14:Rheumatoid Arthritis, Neuropathy] Date Acquired: [10:07/28/2015] [12:07/28/2015] [14:07/28/2015] Weeks of Treatment: [10:1] [12:1] [14:1] Wound Status: [10:Open] [12:Open] [14:Open] Clustered Wound: [10:No] [12:No] [14:Yes] Pending Amputation on Yes [12:Yes] [14:Yes] Presentation: Measurements L x W x D 6.5x8x0.1 [12:11x6.3x0.1] [14:10x7.5x0.1] (cm) Area (cm) : [10:40.841] [12:54.428] [14:58.905] Volume (cm) : [10:4.084] [12:5.443] [14:5.89] % Reduction in Area: [10:-21.60%] [12:-26.00%] [14:-7.10%] % Reduction in Volume: -21.60% [12:-26.00%] [14:-7.10%] Classification: [10:Unable to visualize wound Unable to visualize wound Unable to visualize wound bed] [12:bed] [14:bed] Exudate Amount: [10:None Present] [12:None Present] [14:None Present] Exudate Type: [10:N/A] [12:N/A] [14:N/A] Exudate Color: [10:N/A] [12:N/A] [14:N/A] Wound Margin: [10:Distinct, outline attached Indistinct,  nonvisible] [14:Indistinct, nonvisible] Granulation Amount: None Present (0%) None Present (0%) None Present (0%) Necrotic Amount: Large (67-100%) Large (67-100%) Large (67-100%) Necrotic Tissue: Eschar Eschar Eschar Exposed Structures: Fascia: No Fascia: No Fascia: No Fat: No Fat: No Fat: No Tendon: No Tendon: No Tendon: No Muscle: No Muscle: No Muscle: No Joint: No Joint: No Joint: No Bone: No Bone: No Bone: No Limited to Skin Limited to Skin Limited to Skin Breakdown Breakdown Breakdown Epithelialization: None None None Periwound Skin Texture: Edema: No Edema: No Edema: No Excoriation: No Excoriation: No Excoriation: No Induration: No Induration: No Induration: No Callus: No Callus: No Callus: No Crepitus: No Crepitus: No Crepitus: No Fluctuance: No Fluctuance: No Fluctuance: No Friable: No Friable: No Friable: No Rash: No Rash: No Rash: No Scarring: No Scarring: No Scarring: No Periwound Skin Dry/Scaly: Yes Dry/Scaly: Yes Dry/Scaly: Yes Moisture: Maceration: No Maceration: No Maceration: No Moist: No Moist: No Moist: No Periwound Skin Color: Atrophie Blanche: No Atrophie Blanche: No Atrophie Blanche: No Cyanosis: No Cyanosis: No Cyanosis: No Ecchymosis: No Ecchymosis: No Ecchymosis: No Erythema: No Erythema: No Erythema: No Hemosiderin Staining: No Hemosiderin Staining: No Hemosiderin Staining: No Mottled: No Mottled: No Mottled: No Pallor: No Pallor: No Pallor: No Rubor: No Rubor: No Rubor: No Temperature: No Abnormality No Abnormality No Abnormality Tenderness on No No No Palpation: Wound Preparation: Ulcer Cleansing: Ulcer Cleansing: Ulcer Cleansing: Rinsed/Irrigated with Rinsed/Irrigated with Rinsed/Irrigated with Saline Saline Saline Topical Anesthetic Topical Anesthetic Topical Anesthetic Applied: None Applied: None Applied: None Assessment Notes: N/A Converted wounds to Converted wounds to combine area: Great  toe combine area: Great toe, and second toe along with third and forth toes along plantar surface is all with plantar surface is all necrotic tissue. necrotic tissue. Wound Number: 9 N/A N/A Photos: No Photos N/A N/A Wound Location: Left Calcaneous N/A N/A ROSHAUNDA, STARKEY ([id number]) Wounding Event: Pressure Injury N/A N/A Primary Etiology: 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 Comorbid History: Anemia, Type I Diabetes, N/A N/A End Stage Renal Disease, Rheumatoid Arthritis, Neuropathy Date Acquired: 07/28/2015 N/A N/A Weeks of Treatment: 1 N/A N/A Wound Status: Open N/A N/A Clustered Wound: No N/A N/A Pending Amputation on No N/A N/A Presentation: Measurements L x W x D 3.5x6x0.1 N/A N/A (cm) Area (cm) : 16.493 N/A N/A Volume (cm) : 1.649 N/A N/A % Reduction in Area: 11.20% N/A N/A % Reduction in Volume: 11.20% N/A N/A Classification: Grade 2 N/A N/A Exudate Amount: Small N/A N/A Exudate Type: Serosanguineous N/A N/A Exudate Color: red, brown N/A N/A Wound Margin: Distinct, outline attached N/A N/A Granulation Amount: None Present (0%) N/A N/A Necrotic Amount: Large (67-100%) N/A N/A Necrotic Tissue: Eschar, Adherent Slough N/A N/A Exposed Structures: Fascia: No N/A N/A Fat: No Tendon: No  Muscle: No Joint: No Bone: No Limited to Skin Breakdown Epithelialization: None N/A N/A Periwound Skin Texture: Edema: No N/A N/A Excoriation: No Induration: No Callus: No Crepitus: No Fluctuance: No Friable: No Rash: No Scarring: No Periwound Skin Moist: Yes N/A N/A Moisture: Maceration: No Dry/Scaly: No Periwound Skin Color: Atrophie Blanche: No N/A N/A Cyanosis: No [name], [name] N. ([id number]) Ecchymosis: No Erythema: No Hemosiderin Staining: No Mottled: No Pallor: No Rubor: No Temperature: No Abnormality N/A N/A Tenderness on Yes N/A N/A Palpation: Wound Preparation: Ulcer Cleansing: N/A N/A Rinsed/Irrigated  with Saline Topical Anesthetic Applied: Other: lidocaine 4% Assessment Notes: N/A N/A N/A Treatment Notes Electronic Signature(s) Signed: 09/09/2015 6:01:17 PM By: Elliot Gurney, RN, [name], Kim RN, [name] Entered By: Elliot Gurney, RN, [name], Kim on 09/09/2015 16:43:15 LILLIE, PORTNER ([id number]) -------------------------------------------------------------------------------- Multi-Disciplinary Care Plan Details MAKYIA, ERXLEBEN 09/09/2015 3:45 Patient Name: Date of Service: N. PM Medical Record Patient Account Number: [id number] Number: Treating RN: Huel Coventry Date of Birth/Sex: 11/13/1980 (34 y.o. Female) Other Clinician: Primary Care Physician: [name] [name] Referring Physician: Rolin Wu Physician/Extender: Tania Ade in Treatment: 1 Active Inactive HBO Nursing Diagnoses: Anxiety related to feelings of confinement associated with the hyperbaric oxygen chamber Anxiety related to knowledge deficit of hyperbaric oxygen therapy and treatment procedures Discomfort related to temperature and humidity changes inside hyperbaric chamber Potential for barotraumas to ears, sinuses, teeth, and lungs or cerebral gas embolism related to changes in atmospheric pressure inside hyperbaric oxygen chamber Potential for oxygen toxicity seizures related to delivery of 100% oxygen at an increased atmospheric pressure Potential for pulmonary oxygen toxicity related to delivery of 100% oxygen at an increased atmospheric pressure Goals: Barotrauma will be prevented during HBO2 Date Initiated: 08/30/2015 Goal Status: Active Patient and/or family will be able to state/discuss factors appropriate to the management of their disease process during treatment Date Initiated: 08/30/2015 Goal Status: Active Patient will tolerate the hyperbaric oxygen therapy treatment Date Initiated: 08/30/2015 Goal Status: Active Patient will tolerate the internal climate of the chamber Date  Initiated: 08/30/2015 Goal Status: Active Patient/caregiver will verbalize understanding of HBO goals, rationale, procedures and potential hazards Date Initiated: 08/30/2015 Goal Status: Active Signs and symptoms of pulmonary oxygen toxicity will be recognized and promptly addressed Date Initiated: 08/30/2015 Goal Status: Active AMIR, GLAUS ([id number]) Signs and symptoms of seizure will be recognized and promptly addressed ; seizing patients will suffer no harm Date Initiated: 08/30/2015 Goal Status: Active Interventions: Administer a five (5) minute air break for patient if signs and symptoms of seizure appear and notify the hyperbaric physician Administer a ten (10) minute air break for patient if signs and symptoms of seizure appear and notify the hyperbaric physician Administer decongestants, per physician orders, prior to HBO2 Administer the correct therapeutic gas delivery based on the patients needs and limitations, per physician order Assess and provide for patientos comfort related to the hyperbaric environment and equalization of middle ear Assess for signs and symptoms related to adverse events, including but not limited to confinement anxiety, pneumothorax, oxygen toxicity and baurotrauma Assess patient for any history of confinement anxiety Assess patient's knowledge and expectations regarding hyperbaric medicine and provide education related to the hyperbaric environment, goals of treatment and prevention of adverse events Implement protocols to decrease risk of pneumothorax in high risk patients Notes: Orientation to the Wound Care Program Nursing Diagnoses: Knowledge deficit related to the wound healing center program Goals: Patient/caregiver will verbalize understanding of the Wound Healing Center Program Date Initiated: 08/30/2015 Goal Status: Active Interventions: Provide  education on orientation to the wound center Notes: Pressure Nursing  Diagnoses: Knowledge deficit related to causes and risk factors for pressure ulcer development Knowledge deficit related to management of pressures ulcers Potential for impaired tissue integrity related to pressure, friction, moisture, and shear GoalsDOTTI, BUSEY ([id number]) Patient will remain free from development of additional pressure ulcers Date Initiated: 08/30/2015 Goal Status: Active Patient will remain free of pressure ulcers Date Initiated: 08/30/2015 Goal Status: Active Patient/caregiver will verbalize risk factors for pressure ulcer development Date Initiated: 08/30/2015 Goal Status: Active Patient/caregiver will verbalize understanding of pressure ulcer management Date Initiated: 08/30/2015 Goal Status: Active Interventions: Assess: immobility, friction, shearing, incontinence upon admission and as needed Assess offloading mechanisms upon admission and as needed Assess potential for pressure ulcer upon admission and as needed Provide education on pressure ulcers Treatment Activities: Patient referred for home evaluation of offloading devices/mattresses : 09/09/2015 Patient referred for pressure reduction/relief devices : 09/09/2015 Pressure reduction/relief device ordered : 09/09/2015 Notes: Wound/Skin Impairment Nursing Diagnoses: Impaired tissue integrity Knowledge deficit related to ulceration/compromised skin integrity Goals: Patient will have a decrease in wound volume by X% from date: (specify in notes) Date Initiated: 08/30/2015 Goal Status: Active Patient/caregiver will verbalize understanding of skin care regimen Date Initiated: 08/30/2015 Goal Status: Active Ulcer/skin breakdown will have a volume reduction of 30% by week 4 Date Initiated: 08/30/2015 Goal Status: Active Ulcer/skin breakdown will have a volume reduction of 50% by week 8 Date Initiated: 08/30/2015 Goal Status: Active [name], [name] ([id number]) Ulcer/skin breakdown will have  a volume reduction of 80% by week 12 Date Initiated: 08/30/2015 Goal Status: Active Ulcer/skin breakdown will heal within 14 weeks Date Initiated: 08/30/2015 Goal Status: Active Interventions: Assess patient/caregiver ability to obtain necessary supplies Assess patient/caregiver ability to perform ulcer/skin care regimen upon admission and as needed Assess ulceration(s) every visit Provide education on ulcer and skin care Notes: Electronic Signature(s) Signed: 09/09/2015 6:01:17 PM By: Elliot Gurney, RN, [name], Kim RN, [name] Entered By: Elliot Gurney, RN, [name], Kim on 09/09/2015 16:43:05 [name] ([id number]) -------------------------------------------------------------------------------- Patient/Caregiver Education Details SIDNIE, SWALLEY 09/09/2015 3:45 Patient Name: Date of Service: N. PM Medical Record Patient Account Number: [id number] Number: Treating RN: Huel Coventry Date of Birth/Gender: 01/09/1981 (34 y.o. Female) Other Clinician: Primary Care Physician: [name] [name] Referring Physician: Rolin Wu Physician/Extender: Tania Ade in Treatment: 1 Education Assessment Education Provided To: Patient Education Topics Provided Wound/Skin Impairment: Handouts: Caring for Your Ulcer, Other: wound are as prescribed Methods: Demonstration Responses: State content correctly Electronic Signature(s) Signed: 09/09/2015 6:01:17 PM By: Elliot Gurney, RN, [name], Kim RN, [name] Entered By: Elliot Gurney, RN, [name], Kim on 09/09/2015 17:06:07 [name] ([id number]) -------------------------------------------------------------------------------- Wound Assessment Details RAYLA, PEMBER 09/09/2015 3:45 Patient Name: Date of Service: N. PM Medical Record Patient Account Number: [id number] Number: Treating RN: Huel Coventry Date of Birth/Sex: 06-11-1981 (34 y.o. Female) Other Clinician: Primary Care Physician: [name] [name] Referring Physician: Rolin Wu Physician/Extender: Tania Ade in Treatment: 1 Wound Status Wound Number: 10 Primary Diabetic Wound/Ulcer of the Lower Etiology: Extremity Wound Location: Right Calcaneous - Circumfernential Wound Open Status: Wounding Event: Pressure Injury Comorbid Anemia, Type I Diabetes, End Stage Date Acquired: 07/28/2015 History: Renal Disease, Rheumatoid Arthritis, Weeks Of Treatment: 1 Neuropathy Clustered Wound: No Pending Amputation On Presentation Photos Photo Uploaded By: Elliot Gurney, RN, [name], Kim on 09/09/2015 17:22:22 Wound Measurements Length: (cm) 6.5 Width: (cm) 8 Depth: (cm) 0.1 Area: (cm) 40.841 Volume: (cm) 4.084 % Reduction in Area: -21.6% % Reduction in Volume: -21.6%  Epithelialization: None Wound Description Classification: Unable to visualize wound bed Foul Odor Afte Wound Margin: Distinct, outline attached Exudate Amount: None Present r Cleansing: No Wound Bed Granulation Amount: None Present (0%) Exposed Structure Necrotic Amount: Large (67-100%) Fascia Exposed: No Necrotic Quality: Eschar Fat Layer Exposed: No Limberg, Allyse N. ([id number]) Tendon Exposed: No Muscle Exposed: No Joint Exposed: No Bone Exposed: No Limited to Skin Breakdown Periwound Skin Texture Texture Color No Abnormalities Noted: No No Abnormalities Noted: No Callus: No Atrophie Blanche: No Crepitus: No Cyanosis: No Excoriation: No Ecchymosis: No Fluctuance: No Erythema: No Friable: No Hemosiderin Staining: No Induration: No Mottled: No Localized Edema: No Pallor: No Rash: No Rubor: No Scarring: No Temperature / Pain Moisture Temperature: No Abnormality No Abnormalities Noted: No Dry / Scaly: Yes Maceration: No Moist: No Wound Preparation Ulcer Cleansing: Rinsed/Irrigated with Saline Topical Anesthetic Applied: None Treatment Notes Wound #10 (Right, Circumferential Calcaneous) 1. Cleansed with: Clean wound with Normal  Saline Notes betadine paint, abd, conform Electronic Signature(s) Signed: 09/09/2015 6:01:17 PM By: Elliot Gurney, RN, [name], Kim RN, [name] Entered By: Elliot Gurney, RN, [name], Kim on 09/09/2015 16:38:39 [name] ([id number]) -------------------------------------------------------------------------------- Wound Assessment Details TAYSIA, [name] 09/09/2015 3:45 Patient Name: Date of Service: N. PM Medical Record Patient Account Number: [id number] Number: Treating RN: Huel Coventry Date of Birth/Sex: August 12, 1981 (34 y.o. Female) Other Clinician: Primary Care Physician: [name] [name] Referring Physician: Rolin Wu Physician/Extender: Tania Ade in Treatment: 1 Wound Status Wound Number: 12 Primary Diabetic Wound/Ulcer of the Lower Etiology: Extremity Wound Location: Left Foot - Plantar, Circumfernential Wound Open Status: Wounding Event: Gradually Appeared Comorbid Anemia, Type I Diabetes, End Stage Date Acquired: 07/28/2015 History: Renal Disease, Rheumatoid Arthritis, Weeks Of Treatment: 1 Neuropathy Clustered Wound: No Pending Amputation On Presentation Photos Wound Measurements Length: (cm) 11 Width: (cm) 6.3 Depth: (cm) 0.1 Area: (cm) 54.428 Volume: (cm) 5.443 % Reduction in Area: -26% % Reduction in Volume: -26% Epithelialization: None Wound Description Classification: Grade 2 Foul Odor Afte Wound Margin: Indistinct, nonvisible Due to Product Exudate Amount: Medium Exudate Type: Serous Exudate Color: amber r Cleansing: Yes Use: No Wound Bed Granulation Amount: None Present (0%) Exposed Structure Necrotic Amount: Large (67-100%) Fascia Exposed: No [name], [name] N. ([id number]) Necrotic Quality: Eschar Fat Layer Exposed: No Tendon Exposed: No Muscle Exposed: No Joint Exposed: No Bone Exposed: No Limited to Skin Breakdown Periwound Skin Texture Texture Color No Abnormalities Noted: No No Abnormalities Noted:  No Callus: No Atrophie Blanche: No Crepitus: No Cyanosis: No Excoriation: No Ecchymosis: No Fluctuance: No Erythema: No Friable: No Hemosiderin Staining: No Induration: No Mottled: No Localized Edema: No Pallor: No Rash: No Rubor: No Scarring: No Temperature / Pain Moisture Temperature: No Abnormality No Abnormalities Noted: No Dry / Scaly: No Maceration: No Moist: Yes Wound Preparation Ulcer Cleansing: Rinsed/Irrigated with Saline Topical Anesthetic Applied: None Assessment Notes Converted wounds to combine area: Great toe and second toe along with plantar surface is all necrotic tissue. Treatment Notes Wound #12 (Left, Plantar, Circumferential Foot) 1. Cleansed with: Clean wound with Normal Saline 4. Dressing Applied: Aquacel Ag 5. Secondary Dressing Applied ABD and Kerlix/Conform Electronic Signature(s) Signed: 09/09/2015 6:01:17 PM By: Elliot Gurney, RN, [name], Kim RN, [name] Entered By: Elliot Gurney, RN, [name], Kim on 09/09/2015 17:34:38 [name] ([id number]) -------------------------------------------------------------------------------- Wound Assessment Details [name], [name] 09/09/2015 3:45 Patient Name: Date of Service: N. PM Medical Record Patient Account Number: [id number] Number: Treating RN: Huel Coventry Date of Birth/Sex: June 30, 1981 (34 y.o. Female) Other Clinician: Primary Care Physician: [name]  Treating Britto, [name] Referring Physician: Rolin Wu Physician/Extender: Weeks in Treatment: 1 Wound Status Wound Number: 14 Primary Diabetic Wound/Ulcer of the Lower Etiology: Extremity Wound Location: Right Foot - Plantar, Circumfernential Wound Open Status: Wounding Event: Gradually Appeared Comorbid Anemia, Type I Diabetes, End Stage Date Acquired: 07/28/2015 History: Renal Disease, Rheumatoid Arthritis, Weeks Of Treatment: 1 Neuropathy Clustered Wound: Yes Pending Amputation On Presentation Photos Photo Uploaded By:  Elliot Gurney, RN, [name], Kim on 09/09/2015 17:23:51 Wound Measurements Length: (cm) 10 Width: (cm) 7.5 Depth: (cm) 0.1 Area: (cm) 58.905 Volume: (cm) 5.89 % Reduction in Area: -7.1% % Reduction in Volume: -7.1% Epithelialization: None Wound Description Classification: Unable to visualize wound bed Foul Odor Afte Wound Margin: Indistinct, nonvisible Exudate Amount: None Present r Cleansing: No Wound Bed Granulation Amount: None Present (0%) Exposed Structure Necrotic Amount: Large (67-100%) Fascia Exposed: No Necrotic Quality: Eschar Fat Layer Exposed: No Talerico, Moksha N. ([id number]) Tendon Exposed: No Muscle Exposed: No Joint Exposed: No Bone Exposed: No Limited to Skin Breakdown Periwound Skin Texture Texture Color No Abnormalities Noted: No No Abnormalities Noted: No Callus: No Atrophie Blanche: No Crepitus: No Cyanosis: No Excoriation: No Ecchymosis: No Fluctuance: No Erythema: No Friable: No Hemosiderin Staining: No Induration: No Mottled: No Localized Edema: No Pallor: No Rash: No Rubor: No Scarring: No Temperature / Pain Moisture Temperature: No Abnormality No Abnormalities Noted: No Dry / Scaly: Yes Maceration: No Moist: No Wound Preparation Ulcer Cleansing: Rinsed/Irrigated with Saline Topical Anesthetic Applied: None Assessment Notes Converted wounds to combine area: Great toe, third and forth toes along with plantar surface is all necrotic tissue. Treatment Notes Wound #14 (Right, Plantar, Circumferential Foot) 1. Cleansed with: Clean wound with Normal Saline Notes betadine paint, abd, conform Electronic Signature(s) Signed: 09/09/2015 6:01:17 PM By: Elliot Gurney, RN, [name], Kim RN, [name] Entered By: Elliot Gurney, RN, [name], Kim on 09/09/2015 16:41:16 [name] ([id number]) -------------------------------------------------------------------------------- Wound Assessment Details JAMYIA, [name] 09/09/2015 3:45 Patient Name: Date of  Service: N. PM Medical Record Patient Account Number: [id number] Number: Treating RN: Huel Coventry Date of Birth/Sex: 03/13/1981 (34 y.o. Female) Other Clinician: Primary Care Physician: [name] [name] Referring Physician: Rolin Wu Physician/Extender: Tania Ade in Treatment: 1 Wound Status Wound Number: 9 Primary Diabetic Wound/Ulcer of the Lower Etiology: Extremity Wound Location: Left Calcaneous Wound Open Wounding Event: Pressure Injury Status: Date Acquired: 07/28/2015 Comorbid Anemia, Type I Diabetes, End Stage Weeks Of Treatment: 1 History: Renal Disease, Rheumatoid Arthritis, Clustered Wound: No Neuropathy Photos Wound Measurements Length: (cm) 3.5 Width: (cm) 6 Depth: (cm) 0.1 Area: (cm) 16.493 Volume: (cm) 1.649 % Reduction in Area: 11.2% % Reduction in Volume: 11.2% Epithelialization: None Wound Description Classification: Grade 2 Foul Odor Afte Wound Margin: Distinct, outline attached Due to Product Exudate Amount: Medium Exudate Type: Serosanguineous Exudate Color: red, brown r Cleansing: Yes Use: No Wound Bed Granulation Amount: None Present (0%) Exposed Structure Necrotic Amount: Large (67-100%) Fascia Exposed: No Necrotic Quality: Eschar, Adherent Slough Fat Layer Exposed: No [name], [name] ([id number]) Tendon Exposed: No Muscle Exposed: No Joint Exposed: No Bone Exposed: No Limited to Skin Breakdown Periwound Skin Texture Texture Color No Abnormalities Noted: No No Abnormalities Noted: No Callus: No Atrophie Blanche: No Crepitus: No Cyanosis: No Excoriation: No Ecchymosis: No Fluctuance: No Erythema: No Friable: No Hemosiderin Staining: No Induration: No Mottled: No Localized Edema: No Pallor: No Rash: No Rubor: No Scarring: No Temperature / Pain Moisture Temperature: No Abnormality No Abnormalities Noted: No Tenderness on Palpation: Yes Dry / Scaly: No Maceration: No Moist:  Yes Wound Preparation Ulcer Cleansing: Rinsed/Irrigated with Saline Topical Anesthetic Applied: Other: lidocaine 4%, Treatment Notes Wound #9 (Left Calcaneous) 1. Cleansed with: Clean wound with Normal Saline 4. Dressing Applied: Aquacel Ag 5. Secondary Dressing Applied ABD and Kerlix/Conform Electronic Signature(s) Signed: 09/09/2015 6:01:17 PM By: Elliot Gurney, RN, [name], Kim RN, [name] Entered By: Elliot Gurney, RN, [name], Kim on 09/09/2015 17:35:14 TENILLE, MORRILL ([id number]) -------------------------------------------------------------------------------- Vitals Details SAMIA, KUKLA 09/09/2015 3:45 Patient Name: Date of Service: N. PM Medical Record Patient Account Number: [id number] Number: Treating RN: Huel Coventry Date of Birth/Sex: Sep 08, 1981 (34 y.o. Female) Other Clinician: Primary Care Physician: [name] [name] Referring Physician: Rolin Wu Physician/Extender: Tania Ade in Treatment: 1 Vital Signs Time Taken: 04:31 Pulse (bpm): 84 Respiratory Rate (breaths/min): 18 Blood Pressure (mmHg): 101/60 Capillary Blood Glucose (mg/dl): 409 Reference Range: 80 - 120 mg / dl Electronic Signature(s) Signed: 09/09/2015 6:01:17 PM By: Elliot Gurney, RN, [name], Kim RN, [name] Entered By: Elliot Gurney, RN, [name], Kim on 09/09/2015 16:31:29

## 2015-09-11 ENCOUNTER — Encounter: Payer: Medicare Other | Admitting: Surgery

## 2015-09-12 ENCOUNTER — Encounter: Payer: Medicare Other | Admitting: Surgery

## 2015-09-13 ENCOUNTER — Encounter: Payer: Medicare Other | Admitting: Surgery

## 2015-09-13 ENCOUNTER — Ambulatory Visit: Payer: Medicare Other

## 2015-09-16 ENCOUNTER — Encounter: Payer: Medicare Other | Admitting: Surgery

## 2015-09-17 ENCOUNTER — Ambulatory Visit: Payer: Medicare Other | Admitting: Surgery

## 2015-09-17 ENCOUNTER — Encounter: Payer: Medicare Other | Admitting: Surgery

## 2015-09-18 ENCOUNTER — Encounter: Payer: Medicare Other | Admitting: Surgery

## 2015-09-19 ENCOUNTER — Encounter: Payer: Medicare Other | Admitting: Surgery

## 2015-09-19 DIAGNOSIS — E10621 Type 1 diabetes mellitus with foot ulcer: Secondary | ICD-10-CM | POA: Diagnosis not present

## 2015-09-20 NOTE — Progress Notes (Addendum)
[name], [name] ([id number]) Visit Report for 09/19/2015 Chief Complaint Document Details [name] 09/19/2015 2:15 Patient Name: Date of Service: N. PM Medical Record Patient Account Number: [id number] Number: Treating RN: [name] 1981/08/31 (34 y.o. Other Clinician: Date of Birth/Sex: Female) [name] Primary Care Physician/Extender: [name] Physician: Referring Physician: [name] Wu in Treatment: 2 Information Obtained from: Patient Chief Complaint Patient in today for treatment of non-healing wound and HBO Treatment. she has just gotten out of hospital this week and is back to resume her hyperbaric oxygen therapy Electronic Signature(s) Signed: 09/19/2015 3:28:44 PM By: [name] Kanner MD, FACS Entered By: [name] on 09/19/2015 15:28:44 [name] ([id number]) -------------------------------------------------------------------------------- HPI Details [name] 09/19/2015 2:15 Patient Name: Date of Service: N. PM Medical Record Patient Account Number: [id number] Number: Treating RN: [name] 04/17/1981 (34 y.o. Other Clinician: Date of Birth/Sex: Female) [name] Primary Care Physician/Extender: [name] Physician: Referring Physician: [name] Wu in Treatment: 2 History of Present Illness Location: dry gangrene both feet and heels Quality: Patient reports No Pain. Severity: Patient states wound are getting worse. Duration: Patient has had the wound for > 80months prior to seeking treatment at the wound Wu Context: The wound appeared gradually over time Modifying Factors: she has been in and out of hospital over the last 2 months Associated Signs and Symptoms: Patient reports having difficulty standing for long periods. HPI Description: [name] is a 34 y.o. female who presents to our wound Wu, back in June 2016,  referred by her PCP Dr. Zada Wu for nonhealing ulcers on the lateral aspect of the right heel. Of note she has a history of type 1 diabetes mellitus that has been uncontrolled. Past medical history significant for type 1 diabetes mellitus not controlled, ankylosing spondylitis, anorexia nervosa, irritable bowel syndrome, chronic kidney disease, chronic diarrhea. she then developed gangrene of both feet due to severe peripheral vascular disease and also had gangrene of the tips of her fingers due to upper extremity vascular disease. She was being worked up by [facility] at [name] at [name] and has had several procedures done there. She started with hyperbaric oxygen therapy and had a total of 40 treatments the last one being on 06/20/2015. After the initial treatment of hyperbaric oxygen therapy she started having ear problems and had ultimately to use myringotomy tubes and this was done bilaterally. Since then her ears have been doing fine. In late September, she had seen vascular and hand surgeons. since then she's been in [name] involving extensive vascular procedures for the upper extremities. She was then at [name] with GI bleeds both upper and lower and has been in and out of hospital for that. She has recently been out of hospital for the last week. 09/09/2015 -- she was unable to get here in time to start her hyperbaric oxygen today and hence is only here for a wound care visit. 09/19/2015 -- she has been having vancomycin during her dialysis and continues to have vascular appointments and the procedure is been set for early January. She has been unable to make it for her hemodialysis due to various medical symptoms. Electronic Signature(s) Signed: 09/19/2015 3:29:40 PM By: [name] Kanner MD, FACS Entered By: [name] on 09/19/2015 15:29:40 [name]  ([id number]) -------------------------------------------------------------------------------- Physical Exam Details [name], [name] 09/19/2015 2:15 Patient Name: Date of Service: N. PM Medical Record Patient Account Number: [id number] Number: Treating RN: [name]  1980/11/28 (34 y.o. Other Clinician: Date of Birth/Sex: Female) [name] Primary Care Physician/Extender: [name] Physician: Referring Physician: [name] Wu in Treatment: 2 Constitutional . Pulse regular. Respirations normal and unlabored. Afebrile. . Eyes Nonicteric. Reactive to light. Ears, Nose, Mouth, and Throat Lips, teeth, and gums WNL.Marland Kitchen Moist mucosa without lesions. Neck supple and nontender. No palpable supraclavicular or cervical adenopathy. Normal sized without goiter. Respiratory WNL. No retractions.. Cardiovascular Pedal Pulses WNL. No clubbing, cyanosis or edema. Lymphatic No adneopathy. No adenopathy. No adenopathy. Musculoskeletal Adexa without tenderness or enlargement.. Digits and nails w/o clubbing, cyanosis, infection, petechiae, ischemia, or inflammatory conditions.. Integumentary (Hair, Skin) No suspicious lesions. No crepitus or fluctuance. No peri-wound warmth or erythema. No masses.Marland Kitchen Psychiatric Judgement and insight Intact.. No evidence of depression, anxiety, or agitation.. Notes the right heel and the right gangrenous toes continue to be dry except for a little bit in the fifth webspace. On the left foot her heel has significant drainage and there is some drainage between the first and second toe. Electronic Signature(s) Signed: 09/19/2015 3:30:42 PM By: [name] Kanner MD, FACS Entered By: [name] on 09/19/2015 15:30:41 [name] ([id number]) -------------------------------------------------------------------------------- Physician Orders Details [name], [name] 09/19/2015 2:15 Patient Name: Date of Service: N.  PM Medical Record Patient Account Number: [id number] Number: Treating RN: [name] 05/06/1981 (34 y.o. Other Clinician: Date of Birth/Sex: Female) Treating Kristell Wooding Primary Care Physician/Extender: [name] Physician: Referring Physician: [name] Wu in Treatment: 2 Verbal / Phone Orders: Yes Clinician: Ashok Cordia, Debi Read Back and Verified: Yes Diagnosis Coding ICD-10 Coding Code Description E10.621 Type 1 diabetes mellitus with foot ulcer E10.52 Type 1 diabetes mellitus with diabetic peripheral angiopathy with gangrene I70.245 Atherosclerosis of native arteries of left leg with ulceration of other part of foot I70.261 Atherosclerosis of native arteries of extremities with gangrene, right leg L89.622 Pressure ulcer of left heel, stage 2 Wound Cleansing Wound #10 Right,Circumferential Calcaneous o Clean wound with Normal Saline. Wound #12 Left,Plantar,Circumferential Foot o Clean wound with Normal Saline. Wound #14 Right,Plantar,Circumferential Foot o Clean wound with Normal Saline. Wound #9 Left Calcaneous o Clean wound with Normal Saline. Primary Wound Dressing Wound #10 Right,Circumferential Calcaneous o Other: - paint with betadine Wound #12 Left,Plantar,Circumferential Foot o Aquacel Ag - also between the toes Wound #14 Right,Plantar,Circumferential Foot o Other: - paint with betadine Wound #9 Left Calcaneous o Aquacel Ag - also between the toes Tallon, Leza N. ([id number]) Secondary Dressing Wound #10 Right,Circumferential Calcaneous o Conform/Kerlix Wound #12 Left,Plantar,Circumferential Foot o Conform/Kerlix Wound #14 Right,Plantar,Circumferential Foot o Conform/Kerlix Wound #9 Left Calcaneous o Conform/Kerlix Dressing Change Frequency Wound #10 Right,Circumferential Calcaneous o Change dressing every other day. Wound #12 Left,Plantar,Circumferential Foot o Change dressing every other  day. Wound #14 Right,Plantar,Circumferential Foot o Change dressing every other day. Wound #9 Left Calcaneous o Change dressing every other day. Follow-up Appointments Wound #10 Right,Circumferential Calcaneous o Return Appointment in 1 week. Wound #12 Left,Plantar,Circumferential Foot o Return Appointment in 1 week. Wound #14 Right,Plantar,Circumferential Foot o Return Appointment in 1 week. Wound #9 Left Calcaneous o Return Appointment in 1 week. Home Health Wound #10 Right,Circumferential Calcaneous o Continue Home Health Visits o Home Health Nurse may visit PRN to address patientos wound care needs. o FACE TO FACE ENCOUNTER: MEDICARE and MEDICAID PATIENTS: I certify that this patient is under my care and that I had a face-to-face encounter that meets the physician face-to-face encounter requirements with this patient on this date. The encounter with the patient was in  whole or in part for the following MEDICAL CONDITION: (primary reason for Home Healthcare) JEDA, PARDUE ([id number]) MEDICAL NECESSITY: I certify, that based on my findings, NURSING services are a medically necessary home health service. HOME BOUND STATUS: I certify that my clinical findings support that this patient is homebound (i.e., Due to illness or injury, pt requires aid of supportive devices such as crutches, cane, wheelchairs, walkers, the use of special transportation or the assistance of another person to leave their place of residence. There is a normal inability to leave the home and doing so requires considerable and taxing effort. Other absences are for medical reasons / religious services and are infrequent or of short duration when for other reasons). o If current dressing causes regression in wound condition, may D/C ordered dressing product/s and apply Normal Saline Moist Dressing daily until next Wound Healing Wu / Other MD appointment. Notify Wound Healing Wu  of regression in wound condition at [phone]. o Please direct any NON-WOUND related issues/requests for orders to patient's Primary Care Physician Wound #12 Left,Plantar,Circumferential Foot o Continue Home Health Visits o Home Health Nurse may visit PRN to address patientos wound care needs. o FACE TO FACE ENCOUNTER: MEDICARE and MEDICAID PATIENTS: I certify that this patient is under my care and that I had a face-to-face encounter that meets the physician face-to-face encounter requirements with this patient on this date. The encounter with the patient was in whole or in part for the following MEDICAL CONDITION: (primary reason for Home Healthcare) MEDICAL NECESSITY: I certify, that based on my findings, NURSING services are a medically necessary home health service. HOME BOUND STATUS: I certify that my clinical findings support that this patient is homebound (i.e., Due to illness or injury, pt requires aid of supportive devices such as crutches, cane, wheelchairs, walkers, the use of special transportation or the assistance of another person to leave their place of residence. There is a normal inability to leave the home and doing so requires considerable and taxing effort. Other absences are for medical reasons / religious services and are infrequent or of short duration when for other reasons). o If current dressing causes regression in wound condition, may D/C ordered dressing product/s and apply Normal Saline Moist Dressing daily until next Wound Healing Wu / Other MD appointment. Notify Wound Healing Wu of regression in wound condition at [phone]. o Please direct any NON-WOUND related issues/requests for orders to patient's Primary Care Physician Wound #14 Right,Plantar,Circumferential Foot o Continue Home Health Visits o Home Health Nurse may visit PRN to address patientos wound care needs. o FACE TO FACE ENCOUNTER: MEDICARE and MEDICAID PATIENTS:  I certify that this patient is under my care and that I had a face-to-face encounter that meets the physician face-to-face encounter requirements with this patient on this date. The encounter with the patient was in whole or in part for the following MEDICAL CONDITION: (primary reason for Home Healthcare) MEDICAL NECESSITY: I certify, that based on my findings, NURSING services are a medically necessary home health service. HOME BOUND STATUS: I certify that my clinical findings support that this patient is homebound (i.e., Due to illness or injury, pt requires aid of supportive devices such as crutches, cane, wheelchairs, walkers, the use of special transportation or the assistance of another person to leave their place of residence. There is a normal inability to leave the home and doing so requires considerable and taxing effort. Other absences are for medical reasons / religious services and are infrequent or  of short duration when for other reasons). KAYDINCE, REITHER ([id number]) o If current dressing causes regression in wound condition, may D/C ordered dressing product/s and apply Normal Saline Moist Dressing daily until next Wound Healing Wu / Other MD appointment. Notify Wound Healing Wu of regression in wound condition at [phone]. o Please direct any NON-WOUND related issues/requests for orders to patient's Primary Care Physician Wound #9 Left Calcaneous o Continue Home Health Visits o Home Health Nurse may visit PRN to address patientos wound care needs. o FACE TO FACE ENCOUNTER: MEDICARE and MEDICAID PATIENTS: I certify that this patient is under my care and that I had a face-to-face encounter that meets the physician face-to-face encounter requirements with this patient on this date. The encounter with the patient was in whole or in part for the following MEDICAL CONDITION: (primary reason for Home Healthcare) MEDICAL NECESSITY: I certify, that based on  my findings, NURSING services are a medically necessary home health service. HOME BOUND STATUS: I certify that my clinical findings support that this patient is homebound (i.e., Due to illness or injury, pt requires aid of supportive devices such as crutches, cane, wheelchairs, walkers, the use of special transportation or the assistance of another person to leave their place of residence. There is a normal inability to leave the home and doing so requires considerable and taxing effort. Other absences are for medical reasons / religious services and are infrequent or of short duration when for other reasons). o If current dressing causes regression in wound condition, may D/C ordered dressing product/s and apply Normal Saline Moist Dressing daily until next Wound Healing Wu / Other MD appointment. Notify Wound Healing Wu of regression in wound condition at [phone]. o Please direct any NON-WOUND related issues/requests for orders to patient's Primary Care Physician Hyperbaric Oxygen Therapy Wound #10 Right,Circumferential Calcaneous o Evaluate for HBO Therapy o Indication: - Wagner 4 o If appropriate for treatment, begin HBOT per protocol: o 2.0 ATA for 90 Minutes without Air Breaks o One treatment per day (delivered Monday through Friday unless otherwise specified in Special Instructions below): o Total # of Treatments: - 40 o Finger stick Blood Glucose Pre- and Post- HBOT Treatment. o Follow Hyperbaric Oxygen Glycemia Protocol Wound #12 Left,Plantar,Circumferential Foot o Evaluate for HBO Therapy o Indication: - Wagner 4 o If appropriate for treatment, begin HBOT per protocol: o 2.0 ATA for 90 Minutes without Air Breaks o One treatment per day (delivered Monday through Friday unless otherwise specified in Special Instructions below): o Total # of Treatments: - 40 o Finger stick Blood Glucose Pre- and Post- HBOT Treatment. o Follow  Hyperbaric Oxygen Glycemia Protocol [name], [name] ([id number]) Wound #14 Right,Plantar,Circumferential Foot o Evaluate for HBO Therapy o Indication: - Wagner 4 o If appropriate for treatment, begin HBOT per protocol: o 2.0 ATA for 90 Minutes without Air Breaks o One treatment per day (delivered Monday through Friday unless otherwise specified in Special Instructions below): o Total # of Treatments: - 40 o Finger stick Blood Glucose Pre- and Post- HBOT Treatment. o Follow Hyperbaric Oxygen Glycemia Protocol Wound #9 Left Calcaneous o Evaluate for HBO Therapy o Indication: - Wagner 4 o If appropriate for treatment, begin HBOT per protocol: o 2.0 ATA for 90 Minutes without Air Breaks o One treatment per day (delivered Monday through Friday unless otherwise specified in Special Instructions below): o Total # of Treatments: - 40 o Finger stick Blood Glucose Pre- and Post- HBOT Treatment. o Follow Hyperbaric Oxygen Glycemia Protocol  GLYCEMIA INTERVENTIONS PROTOCOL PRE-HBO GLYCEMIA INTERVENTIONS ACTION INTERVENTION Obtain pre-HBO capillary blood 1 glucose (ensure physician order is in chart). A. Notify HBO physician and await physician orders. 2 If result is 70 mg/dl or below: B. If the result meets the hospital definition of a critical result, follow hospital policy. A. Give patient an 8 ounce Glucerna Shake, an 8 ounce Ensure, or 8 ounces of a Glucerna/Ensure equivalent dietary supplement*. B. Wait 30 minutes. If result is 71 mg/dl to 161 mg/dl: C. Retest patientos capillary blood glucose (CBG). D. If result greater than or equal to 110 mg/dl, proceed with HBO. If result less than 110 mg/dl, notify HBO physician and consider holding HBO. If result is 131 mg/dl to 096 mg/dl: A. Proceed with HBO. If result is 250 mg/dl or greater: Talwar, Wally N. ([id number]) A. Notify HBO physician and await physician orders. B. It is  recommended to hold HBO and do blood/urine ketone testing. C. If the result meets the hospital definition of a critical result, follow hospital policy. POST-HBO GLYCEMIA INTERVENTIONS ACTION INTERVENTION Obtain post HBO capillary blood 1 glucose (ensure physician order is in chart). A. Notify HBO physician and await physician orders. 2 If result is 70 mg/dl or below: B. If the result meets the hospital definition of a critical result, follow hospital policy. A. Give patient an 8 ounce Glucerna Shake, an 8 ounce Ensure, or 8 ounces of a Glucerna/Ensure equivalent dietary supplement*. B. Wait 15 minutes for symptoms of hypoglycemia (i.e. nervousness, anxiety, If result is 71 mg/dl to 914 mg/dl: sweating, chills, clamminess, irritability, confusion, tachycardia or dizziness). C. If patient asymptomatic, discharge patient. If patient symptomatic, repeat capillary blood glucose (CBG) and notify HBO physician. If result is 101 mg/dl to 782 mg/dl: A. Discharge patient. A. Notify HBO physician and await physician orders. B. It is recommended to do If result is 250 mg/dl or greater: blood/urine ketone testing. C. If the result meets the hospital definition of a critical result, follow hospital policy. *Juice or candies are NOT equivalent products. If patient refuses the Glucerna or Ensure, please consult the hospital dietitian for an appropriate substitute. Electronic Signature(s) Signed: 09/19/2015 5:19:49 PM By: [name] Kanner MD, FACS Signed: 09/19/2015 5:45:13 PM By: [name] Previous Signature: 09/19/2015 5:05:23 PM Version By: [name] Kanner MD, FACS Davy, Ledell Peoples ([id number]) Entered By: [name] on 09/19/2015 17:09:49 [name] ([id number]) -------------------------------------------------------------------------------- Problem List Details [name], [name] 09/19/2015 2:15 Patient Name: Date of Service: N. PM Medical Record  Patient Account Number: [id number] Number: Treating RN: [name] April 28, 1981 (34 y.o. Other Clinician: Date of Birth/Sex: Female) Treating Arnaldo Heffron Primary Care Physician/Extender: [name] Physician: Referring Physician: [name] Wu in Treatment: 2 Active Problems ICD-10 Encounter Code Description Active Date Diagnosis E10.621 Type 1 diabetes mellitus with foot ulcer 08/30/2015 Yes E10.52 Type 1 diabetes mellitus with diabetic peripheral 08/30/2015 Yes angiopathy with gangrene I70.245 Atherosclerosis of native arteries of left leg with ulceration 08/30/2015 Yes of other part of foot I70.261 Atherosclerosis of native arteries of extremities with 08/30/2015 Yes gangrene, right leg L89.622 Pressure ulcer of left heel, stage 2 08/30/2015 Yes Inactive Problems Resolved Problems Electronic Signature(s) Signed: 09/19/2015 3:28:33 PM By: [name] Kanner MD, FACS Entered By: [name] on 09/19/2015 15:28:33 [name] ([id number]) -------------------------------------------------------------------------------- Progress Note Details [name], RACZKOWSKI 09/19/2015 2:15 Patient Name: Date of Service: N. PM Medical Record Patient Account Number: [id number] Number: Treating RN: [name] 07-Apr-1981 (34 y.o. Other Clinician: Date of Birth/Sex: Female) Treating Fawnda Vitullo Primary  Care Physician/Extender: [name] Physician: Referring Physician: [name] Wu in Treatment: 2 Subjective Chief Complaint Information obtained from Patient Patient in today for treatment of non-healing wound and HBO Treatment. she has just gotten out of hospital this week and is back to resume her hyperbaric oxygen therapy History of Present Illness (HPI) The following HPI elements were documented for the patient's wound: Location: dry gangrene both feet and heels Quality: Patient reports No Pain. Severity: Patient states wound  are getting worse. Duration: Patient has had the wound for > 4months prior to seeking treatment at the wound Wu Context: The wound appeared gradually over time Modifying Factors: she has been in and out of hospital over the last 2 months Associated Signs and Symptoms: Patient reports having difficulty standing for long periods. Amberleigh Gerken is a 34 y.o. female who presents to our wound Wu, back in June 2016, referred by her PCP Dr. Zada Wu for nonhealing ulcers on the lateral aspect of the right heel. Of note she has a history of type 1 diabetes mellitus that has been uncontrolled. Past medical history significant for type 1 diabetes mellitus not controlled, ankylosing spondylitis, anorexia nervosa, irritable bowel syndrome, chronic kidney disease, chronic diarrhea. she then developed gangrene of both feet due to severe peripheral vascular disease and also had gangrene of the tips of her fingers due to upper extremity vascular disease. She was being worked up by vascular surgery at [facility] and at [facility] and has had several procedures done there. She started with hyperbaric oxygen therapy and had a total of 40 treatments the last one being on 06/20/2015. After the initial treatment of hyperbaric oxygen therapy she started having ear problems and had ultimately to use myringotomy tubes and this was done bilaterally. Since then her ears have been doing fine. In late September, she had seen vascular and hand surgeons. since then she's been in [place] at the rec [facility] for surgery involving extensive vascular procedures for the upper extremities. She was then at [facility] with GI bleeds both upper and lower and has been in and out of hospital for that. She has recently been out of hospital for the last week. 09/09/2015 -- she was unable to get here in time to start her hyperbaric oxygen today and hence is only here for a wound care visit. 09/19/2015 -- she has been having vancomycin  during her dialysis and continues to have vascular Wu, [name] ([id number]) appointments and the procedure is been set for early January. She has been unable to make it for her hemodialysis due to various medical symptoms. Objective Constitutional Pulse regular. Respirations normal and unlabored. Afebrile. Vitals Time Taken: 2:40 PM, Pulse: 81 bpm, Respiratory Rate: 18 breaths/min, Blood Pressure: 98/64 mmHg. Eyes Nonicteric. Reactive to light. Ears, Nose, Mouth, and Throat Lips, teeth, and gums WNL.Marland Kitchen Moist mucosa without lesions. Neck supple and nontender. No palpable supraclavicular or cervical adenopathy. Normal sized without goiter. Respiratory WNL. No retractions.. Cardiovascular Pedal Pulses WNL. No clubbing, cyanosis or edema. Lymphatic No adneopathy. No adenopathy. No adenopathy. Musculoskeletal Adexa without tenderness or enlargement.. Digits and nails w/o clubbing, cyanosis, infection, petechiae, ischemia, or inflammatory conditions.Marland Kitchen Psychiatric Judgement and insight Intact.. No evidence of depression, anxiety, or agitation.. General Notes: the right heel and the right gangrenous toes continue to be dry except for a little bit in the fifth webspace. On the left foot her heel has significant drainage and there is some drainage between the first and second toe. Integumentary (Hair, Skin) No suspicious lesions.  No crepitus or fluctuance. No peri-wound warmth or erythema. No masses.Marland Kitchen [name], [name] ([id number]) Wound #10 status is Open. Original cause of wound was Pressure Injury. The wound is located on the Right,Circumferential Calcaneous. The wound measures 8.6cm length x 8.6cm width x 0.1cm depth; 58.088cm^2 Wu and 5.809cm^3 volume. The wound is limited to skin breakdown. There is no tunneling or undermining noted. There is a none present amount of drainage noted. The wound margin is distinct with the outline attached to the wound base. There is no  granulation within the wound bed. There is a large (67- 100%) amount of necrotic tissue within the wound bed including Eschar. The periwound skin appearance exhibited: Localized Edema, Dry/Scaly. The periwound skin appearance did not exhibit: Callus, Crepitus, Excoriation, Fluctuance, Friable, Induration, Rash, Scarring, Maceration, Moist, Atrophie Blanche, Cyanosis, Ecchymosis, Hemosiderin Staining, Mottled, Pallor, Rubor, Erythema. Periwound temperature was noted as No Abnormality. The periwound has tenderness on palpation. Wound #12 status is Open. Original cause of wound was Gradually Appeared. The wound is located on the Left,Plantar,Circumferential Foot. The wound measures 11cm length x 14cm width x 0.2cm depth; 120.951cm^2 Wu and 24.19cm^3 volume. The wound is limited to skin breakdown. There is no tunneling or undermining noted. There is a large amount of serous drainage noted. The wound margin is indistinct and nonvisible. There is no granulation within the wound bed. There is a large (67-100%) amount of necrotic tissue within the wound bed including Eschar and Adherent Slough. The periwound skin appearance exhibited: Localized Edema, Moist. The periwound skin appearance did not exhibit: Callus, Crepitus, Excoriation, Fluctuance, Friable, Induration, Rash, Scarring, Dry/Scaly, Maceration, Atrophie Blanche, Cyanosis, Ecchymosis, Hemosiderin Staining, Mottled, Pallor, Rubor, Erythema. Periwound temperature was noted as No Abnormality. Wound #14 status is Open. Original cause of wound was Gradually Appeared. The wound is located on the Right,Plantar,Circumferential Foot. The wound measures 12.6cm length x 16.5cm width x 0.1cm depth; 163.284cm^2 Wu and 16.328cm^3 volume. The wound is limited to skin breakdown. There is no tunneling or undermining noted. There is a medium amount of serous drainage noted. The wound margin is indistinct and nonvisible. There is no granulation within the  wound bed. There is a large (67-100%) amount of necrotic tissue within the wound bed including Eschar and Adherent Slough. The periwound skin appearance exhibited: Localized Edema, Dry/Scaly, Moist. The periwound skin appearance did not exhibit: Callus, Crepitus, Excoriation, Fluctuance, Friable, Induration, Rash, Scarring, Maceration, Atrophie Blanche, Cyanosis, Ecchymosis, Hemosiderin Staining, Mottled, Pallor, Rubor, Erythema. Periwound temperature was noted as No Abnormality. Wound #9 status is Open. Original cause of wound was Pressure Injury. The wound is located on the Left Calcaneous. The wound measures 2.9cm length x 7.3cm width x 0.1cm depth; 16.627cm^2 Wu and 1.663cm^3 volume. The wound is limited to skin breakdown. There is no tunneling or undermining noted. There is a small amount of serous drainage noted. The wound margin is distinct with the outline attached to the wound base. There is no granulation within the wound bed. There is a large (67-100%) amount of necrotic tissue within the wound bed including Eschar and Adherent Slough. The periwound skin appearance exhibited: Moist. The periwound skin appearance did not exhibit: Callus, Crepitus, Excoriation, Fluctuance, Friable, Induration, Localized Edema, Rash, Scarring, Dry/Scaly, Maceration, Atrophie Blanche, Cyanosis, Ecchymosis, Hemosiderin Staining, Mottled, Pallor, Rubor, Erythema. Periwound temperature was noted as No Abnormality. The periwound has tenderness on palpation. 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 upon my request the nephrologist to put her on IV vancomycin antibiotics during her dialysis as this may help control the possible infection in her left forefoot. If she is able to get away from her other appointment she will be off for hyperbaric oxygen therapy as often as possible during the week. In the big picture her situation has gotten quite dismal and her general condition has deteriorated markedly. I do not know if the patient and her mother understand the gravity of the entire situation. Plan Wound Cleansing: Wound #10 Right,Circumferential Calcaneous: Clean wound with Normal Saline. Wound #12 Left,Plantar,Circumferential Foot: Clean wound with Normal Saline. Wound #14 Right,Plantar,Circumferential Foot: Clean wound with Normal Saline. Wound #9 Left Calcaneous: Clean wound with Normal Saline. Primary Wound Dressing: Wound #10 Right,Circumferential Calcaneous: Other: - paint with betadine Wound #12 Left,Plantar,Circumferential Foot: Aquacel Ag - also between the toes Wound #14 Right,Plantar,Circumferential Foot: Other: - paint with betadine Wound #9 Left Calcaneous: Aquacel Ag - also between the toes Secondary Dressing: [name], [name] ([id number]) Wound #10 Right,Circumferential Calcaneous: Conform/Kerlix Wound #12 Left,Plantar,Circumferential Foot: Conform/Kerlix Wound #14 Right,Plantar,Circumferential Foot: Conform/Kerlix Wound #9 Left Calcaneous: Conform/Kerlix Dressing Change Frequency: Wound #10 Right,Circumferential Calcaneous: Change dressing every other day. Wound #12 Left,Plantar,Circumferential Foot: Change dressing every other day. Wound #14 Right,Plantar,Circumferential Foot: Change dressing every other day. Wound #9 Left  Calcaneous: Change dressing every other day. Follow-up Appointments: Wound #10 Right,Circumferential Calcaneous: Return Appointment in 1 week. Wound #12 Left,Plantar,Circumferential Foot: Return Appointment in 1 week. Wound #14 Right,Plantar,Circumferential Foot: Return Appointment in 1 week. Wound #9 Left Calcaneous: Return Appointment in 1 week. Home Health: Wound #10 Right,Circumferential Calcaneous: Continue Home Health Visits Home Health Nurse may visit PRN to address patient s wound care needs. FACE TO FACE ENCOUNTER: MEDICARE and MEDICAID PATIENTS: I certify that this patient is under my care and that I had a face-to-face encounter that meets the physician face-to-face encounter requirements with this patient on this date. The encounter with the patient was in whole or in part for the following MEDICAL CONDITION: (primary reason for Home Healthcare) MEDICAL NECESSITY: I certify, that based on my findings, NURSING services are a medically necessary home health service. HOME BOUND STATUS: I certify that my clinical findings support that this patient is homebound (i.e., Due to illness or injury, pt requires aid of supportive devices such as crutches, cane, wheelchairs, walkers, the use of special transportation or the assistance of another person to leave their place of residence. There is a normal inability to leave the home and doing so requires considerable and taxing effort. Other absences are for medical reasons / religious services and are infrequent or of short duration when for other reasons). If current dressing causes regression in wound condition, may D/C ordered dressing product/s and apply Normal Saline Moist Dressing daily until next Wound Healing Wu / Other MD appointment. Notify Wound Healing Wu of regression in wound condition at [phone]. Please direct any NON-WOUND related issues/requests for orders to patient's Primary Care Physician Wound #12  Left,Plantar,Circumferential Foot: Continue Home Health Visits Home Health Nurse may visit PRN to address patient s wound care needs. FACE TO FACE ENCOUNTER: MEDICARE and MEDICAID PATIENTS: I certify that this patient is under my care and that I had a face-to-face encounter that meets the physician face-to-face encounter requirements with this patient on this date. The encounter with the patient was in whole or in part for the following MEDICAL CONDITION: (primary reason for Home Healthcare) MEDICAL NECESSITY: I certify, ZNIYAH, MIDKIFF ([id number]) that based on my findings, NURSING services are a medically necessary home health service. HOME BOUND STATUS: I certify that my clinical findings support that this patient is homebound (i.e., Due to illness or injury, pt requires aid of supportive devices such as crutches, cane, wheelchairs, walkers, the use of special transportation or the assistance of another person to leave their place of residence. There is a normal inability to leave the home and doing so requires considerable and taxing effort. Other absences are for medical reasons / religious services and are infrequent or of short duration when for other reasons). If current dressing causes regression in wound condition, may D/C ordered dressing product/s and apply Normal Saline Moist Dressing daily until next Wound Healing Wu / Other MD appointment. Notify Wound Healing Wu of regression in wound condition at [phone]. Please direct any NON-WOUND related issues/requests for orders to patient's Primary Care Physician Wound #14 Right,Plantar,Circumferential Foot: Continue Home Health Visits Home Health Nurse may visit PRN to address patient s wound care needs. FACE TO FACE ENCOUNTER: MEDICARE and MEDICAID PATIENTS: I certify that this patient is under my care and that I had a face-to-face encounter that meets the physician face-to-face encounter requirements with this  patient on this date. The encounter with the patient was in whole or in part for the following MEDICAL CONDITION: (primary reason for Home Healthcare) MEDICAL NECESSITY: I certify, that based on my findings, NURSING services are a medically necessary home health service. HOME BOUND STATUS: I certify that my clinical findings support that this patient is homebound (i.e., Due to illness or injury, pt requires aid of supportive devices such as crutches, cane, wheelchairs, walkers, the use of special transportation or the assistance of another person to leave their place of residence. There is a normal inability to leave the home and doing so requires considerable and taxing effort. Other absences are for medical reasons / religious services and are infrequent or of short duration when for other reasons). If current dressing causes regression in wound condition, may D/C ordered dressing product/s and apply Normal Saline Moist Dressing daily until next Wound Healing Wu / Other MD appointment. Notify Wound Healing Wu of regression in wound condition at [phone]. Please direct any NON-WOUND related issues/requests for orders to patient's Primary Care Physician Wound #9 Left Calcaneous: Continue Home Health Visits Home Health Nurse may visit PRN to address patient s wound care needs. FACE TO FACE ENCOUNTER: MEDICARE and MEDICAID PATIENTS: I certify that this patient is under my care and that I had a face-to-face encounter that meets the physician face-to-face encounter requirements with this patient on this date. The encounter with the patient was in whole or in part for the following MEDICAL CONDITION: (primary reason for Home Healthcare) MEDICAL NECESSITY: I certify, that based on my findings, NURSING services are a medically necessary home health service. HOME BOUND STATUS: I certify that my clinical findings support that this patient is homebound (i.e., Due to illness or injury, pt  requires aid of  supportive devices such as crutches, cane, wheelchairs, walkers, the use of special transportation or the assistance of another person to leave their place of residence. There is a normal inability to leave the home and doing so requires considerable and taxing effort. Other absences are for medical reasons / religious services and are infrequent or of short duration when for other reasons). If current dressing causes regression in wound condition, may D/C ordered dressing product/s and apply Normal Saline Moist Dressing daily until next Wound Healing Wu / Other MD appointment. Notify Wound Healing Wu of regression in wound condition at [phone]. Please direct any NON-WOUND related issues/requests for orders to patient's Primary Care Physician Hyperbaric Oxygen Therapy: Wound #10 Right,Circumferential Calcaneous: Evaluate for HBO Therapy Indication: - Wagner 4 If appropriate for treatment, begin HBOT per protocol: 2.0 ATA for 90 Minutes without Air Breaks One treatment per day (delivered Monday through Friday unless otherwise specified in Special [place], Karesa N. ([id number]) Instructions below): Total # of Treatments: - 40 Finger stick Blood Glucose Pre- and Post- HBOT Treatment. Follow Hyperbaric Oxygen Glycemia Protocol Wound #12 Left,Plantar,Circumferential Foot: Evaluate for HBO Therapy Indication: - Wagner 4 If appropriate for treatment, begin HBOT per protocol: 2.0 ATA for 90 Minutes without Air Breaks One treatment per day (delivered Monday through Friday unless otherwise specified in Special Instructions below): Total # of Treatments: - 40 Finger stick Blood Glucose Pre- and Post- HBOT Treatment. Follow Hyperbaric Oxygen Glycemia Protocol Wound #14 Right,Plantar,Circumferential Foot: Evaluate for HBO Therapy Indication: - Wagner 4 If appropriate for treatment, begin HBOT per protocol: 2.0 ATA for 90 Minutes without Air Breaks One treatment  per day (delivered Monday through Friday unless otherwise specified in Special Instructions below): Total # of Treatments: - 40 Finger stick Blood Glucose Pre- and Post- HBOT Treatment. Follow Hyperbaric Oxygen Glycemia Protocol Wound #9 Left Calcaneous: Evaluate for HBO Therapy Indication: - Wagner 4 If appropriate for treatment, begin HBOT per protocol: 2.0 ATA for 90 Minutes without Air Breaks One treatment per day (delivered Monday through Friday unless otherwise specified in Special Instructions below): Total # of Treatments: - 40 Finger stick Blood Glucose Pre- and Post- HBOT Treatment. Follow Hyperbaric Oxygen Glycemia Protocol As far as local care goes I have recommended all the dry areas to be painted with Betadine and the left forefoot in the interdigital spaces to apply some Aquacel AG and also use some Aquacel Ag for her left heel with heel protectors. upon my request the nephrologist to put her on IV vancomycin antibiotics during her dialysis as this may help control the possible infection in her left forefoot. If she is able to get away from her other appointment she will be off for hyperbaric oxygen therapy as often as possible during the week. QUENTIN, STREBEL ([id number]) In the big picture her situation has gotten quite dismal and her general condition has deteriorated markedly. I do not know if the patient and her mother understand the gravity of the entire situation. Electronic Signature(s) Signed: 09/19/2015 5:18:16 PM By: [name] Kanner MD, FACS Previous Signature: 09/19/2015 3:32:40 PM Version By: [name] Kanner MD, FACS Entered By: [name] on 09/19/2015 17:18:16 [name] ([id number]) -------------------------------------------------------------------------------- SuperBill Details Patient Name: [name] Date of Service: 09/19/2015 Medical Record Number: [id number] Patient Account Number: [id number] Date of Birth/Sex:  06/15/1981 (34 y.o. 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 extremities with gangrene, right leg L89.622 Pressure ulcer of left heel, stage 2 Facility Procedures CPT4 Code: [id number] Description: [phone] - WOUND CARE VISIT-LEV 2 EST PT Modifier: Quantity: 1 Physician Procedures CPT4: Description Modifier Quantity Code [id number] - WC PHYS LEVEL 3 - EST PT 1 ICD-10 Description Diagnosis E10.621 Type 1 diabetes mellitus with foot ulcer I70.245 Atherosclerosis of native arteries of left leg with ulceration of other part of foot  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

## 2015-09-20 NOTE — Progress Notes (Signed)
MERCIE, BALSLEY ([id number]) Visit Report for 09/19/2015 Arrival Information Details RESHONDA, KOERBER 09/19/2015 2:15 Patient Name: Date of Service: N. PM Medical Record Patient Account Number: [id number] Number: Treating RN: Phillis Haggis Date of Birth/Sex: 1981-01-13 (34 y.o. Female) Other Clinician: Primary Care Physician: [name] Treating [name] Referring Physician: [name] Physician/Extender: Tania Ade in Treatment: 2 Visit Information History Since Last Visit All ordered tests and consults were completed: No Patient Arrived: Wheel Chair Had a fall or experienced change in Yes Arrival Time: 14:37 activities of daily living that may affect Accompanied By: mother risk of falls: Transfer Assistance: EasyPivot Signs or symptoms of abuse/neglect since last No Patient Lift visito Patient Identification Verified: Yes Hospitalized since last visit: No Secondary Verification Process Yes Pain Present Now: No Completed: Patient Requires Transmission- No Based Precautions: Patient Has Alerts: Yes Patient Alerts: ABI:[place] bilateral Electronic Signature(s) Signed: 09/19/2015 5:45:13 PM By: [name] Entered By: [name] on 09/19/2015 14:40:14 [name] ([id number]) -------------------------------------------------------------------------------- Clinic Level of Care Assessment Details MILLISA, GIARRUSSO 09/19/2015 2:15 Patient Name: Date of Service: N. PM Medical Record Patient Account Number: [id number] Number: Treating RN: Phillis Haggis Date of Birth/Sex: 12-Dec-1980 (34 y.o. Female) Other Clinician: Primary Care Physician: [name] Treating [name] Referring Physician: [name] Physician/Extender: Tania Ade in Treatment: 2 Clinic Level of Care Assessment Items TOOL 4 Quantity Score []  - Use when only an EandM is performed on FOLLOW-UP visit 0 ASSESSMENTS - Nursing Assessment /  Reassessment []  - Reassessment of Co-morbidities (includes updates in patient status) 0 X - Reassessment of Adherence to Treatment Plan 1 5 ASSESSMENTS - Wound and Skin Assessment / Reassessment []  - Simple Wound Assessment / Reassessment - one wound 0 X - Complex Wound Assessment / Reassessment - multiple wounds 1 5 []  - Dermatologic / Skin Assessment (not related to wound area) 0 ASSESSMENTS - Focused Assessment X - Circumferential Edema Measurements - multi extremities 1 5 []  - Nutritional Assessment / Counseling / Intervention 0 []  - Lower Extremity Assessment (monofilament, tuning fork, pulses) 0 []  - Peripheral Arterial Disease Assessment (using hand held doppler) 0 ASSESSMENTS - Ostomy and/or Continence Assessment and Care []  - Incontinence Assessment and Management 0 []  - Ostomy Care Assessment and Management (repouching, etc.) 0 PROCESS - Coordination of Care X - Simple Patient / Family Education for ongoing care 1 15 []  - Complex (extensive) Patient / Family Education for ongoing care 0 []  - Staff obtains , Records, Test Results / Process Orders 0 []  - Staff telephones HHA, Nursing Homes / Clarify orders / etc 0 [place], [place] N. ( ) []  - Routine Transfer to another Facility (non-emergent condition) 0 []  - Routine Hospital Admission (non-emergent condition) 0 []  - New Admissions / / Ordering NPWT, Apligraf, etc. 0 []  - Emergency Hospital Admission (emergent condition) 0 X - Simple Discharge Coordination 1 10 []  - Complex (extensive) Discharge Coordination 0 PROCESS - Special Needs []  - Pediatric / Minor Patient Management 0 []  - Isolation Patient Management 0 []  - Hearing / Language / Visual special needs 0 []  - Assessment of Community assistance (transportation, D/C planning, etc.) 0 []  - Additional assistance / Altered mentation 0 []  - Support Surface(s) Assessment (bed, cushion, seat, etc.) 0 INTERVENTIONS - Wound Cleansing /  Measurement []  - Simple Wound Cleansing - one wound 0 X - Complex Wound Cleansing - multiple wounds 1 5 X - Wound Imaging (photographs - any number of wounds) 1 5 []  - Wound Tracing (instead of photographs)  0 X - Simple Wound Measurement - one wound 1 5 []  - Complex Wound Measurement - multiple wounds 0 INTERVENTIONS - Wound Dressings []  - Small Wound Dressing one or multiple wounds 0 X - Medium Wound Dressing one or multiple wounds 1 15 []  - Large Wound Dressing one or multiple wounds 0 []  - Application of Medications - topical 0 []  - Application of Medications - injection 0 Junkin, Kaula N. ([id number]) INTERVENTIONS - Miscellaneous []  - External ear exam 0 []  - Specimen Collection (cultures, biopsies, blood, body fluids, etc.) 0 []  - Specimen(s) / Culture(s) sent or taken to Lab for analysis 0 []  - Patient Transfer (multiple staff / Michiel Sites Lift / Similar devices) 0 []  - Simple Staple / Suture removal (25 or less) 0 []  - Complex Staple / Suture removal (26 or more) 0 []  - Hypo / Hyperglycemic Management (close monitor of Blood Glucose) 0 []  - Ankle / Brachial Index (ABI) - do not check if billed separately 0 X - Vital Signs 1 5 Has the patient been seen at the hospital within the last three years: Yes Total Score: 75 Level Of Care: New/Established - Level 2 Electronic Signature(s) Signed: 09/19/2015 5:45:13 PM By: [name] Entered By: [name] on 09/19/2015 17:29:22 [name] ([id number]) -------------------------------------------------------------------------------- Encounter Discharge Information Details TIYE, HUWE 09/19/2015 2:15 Patient Name: Date of Service: N. PM Medical Record Patient Account Number: [id number] Number: Treating RN: Phillis Haggis Date of Birth/Sex: 1981/01/10 (34 y.o. Female) Other Clinician: Primary Care Physician: [name] Treating [name] Referring Physician: [name] Physician/Extender: Tania Ade in Treatment: 2 Encounter Discharge Information Items Discharge Pain Level: 9 Discharge Condition: Stable Ambulatory Status: Wheelchair Discharge Destination: Home Transportation: Private Auto Accompanied By: mother Schedule Follow-up Appointment: Yes Medication Reconciliation completed and provided to Patient/Care Yes Rebekka Lobello: Provided on Clinical Summary of Care: 09/19/2015 Form Type Recipient Paper Patient HB Electronic Signature(s) Signed: 09/19/2015 5:45:13 PM By: [name] Previous Signature: 09/19/2015 3:46:25 PM Version By: Gwenlyn Perking Entered By: [name] on 09/19/2015 15:47:49 [name] ([id number]) -------------------------------------------------------------------------------- Lower Extremity Assessment Details HAREEM, SUROWIEC 09/19/2015 2:15 Patient Name: Date of Service: N. PM Medical Record Patient Account Number: [id number] Number: Treating RN: Phillis Haggis Date of Birth/Sex: June 18, 1981 (34 y.o. Female) Other Clinician: Primary Care Physician: [name] Treating [name] Referring Physician: [name] Physician/Extender: Tania Ade in Treatment: 2 Vascular Assessment Pulses: Posterior Tibial Dorsalis Pedis Palpable: [Left:Yes] [Right:Yes] Extremity colors, hair growth, and conditions: Extremity Color: [Left:Normal] [Right:Normal] Hair Growth on Extremity: [Left:Yes] [Right:Yes] Temperature of Extremity: [Left:Warm] [Right:Warm] Capillary Refill: [Left:< 3 seconds] [Right:< 3 seconds] Toe Nail Assessment Left: Right: Thick: No No Discolored: No No Deformed: No No Improper Length and Hygiene: No No Electronic Signature(s) Signed: 09/19/2015 5:45:13 PM By: [name] Entered By: [name] on 09/19/2015 14:44:34 [name] ([id number]) -------------------------------------------------------------------------------- Multi Wound Chart  Details Lawley, Ireene 09/19/2015 2:15 Patient Name: Date of Service: N. PM Medical Record Patient Account Number: [id number] Number: Treating RN: Phillis Haggis Date of Birth/Sex: 1981-06-05 (34 y.o. 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] [12:the Lower Extremity] [14:the Lower Extremity] Comorbid History: [10:Anemia, Type I Diabetes, Anemia, Type I Diabetes, Anemia, Type I Diabetes, End Stage Renal Disease, End Stage Renal Disease, End Stage Renal Disease, Rheumatoid Arthritis, Neuropathy] [12:Rheumatoid Arthritis, Neuropathy]  [14:Rheumatoid Arthritis, Neuropathy] Date Acquired: [10:07/28/2015] [12:07/28/2015] [14:07/28/2015] Weeks of Treatment: [10:2] [12:2] [14:2] Wound Status: [10:Open] [12:Open] [14:Open] Clustered Wound: [10:No] [12:No] [14:Yes] Pending Amputation on Yes [12:Yes] [14:Yes] Presentation: Measurements L x W x D 8.6x8.6x0.1 [12:11x14x0.2] [14:12.6x16.5x0.1] (cm) Area (cm) : [10:58.088] [12:120.951] [14:163.284] Volume (cm) : [10:5.809] [12:24.19] [14:16.328] % Reduction in Area: [10:-73.00%] [12:-180.00%] [14:-197.00%] % Reduction in Volume: -73.00% [12:-460.00%] [14:-197.00%] Classification: [10:Unable to visualize wound Grade 2 bed] [14:Unable to visualize wound  bed] Exudate Amount: [10:None Present] [12:Large] [14:Medium] Exudate Type: [10:N/A] [12:Serous] [14:Serous] Exudate Color: [10:N/A No] [12:amber Yes] [14:amber Yes] Foul Odor After Cleansing: Odor Anticipated Due to N/A No No Product Use: Wound Margin: Distinct, outline attached Indistinct, nonvisible Indistinct, nonvisible Granulation Amount: None Present (0%) None Present (0%) None Present (0%) Necrotic Amount: Large (67-100%) Large (67-100%) Large (67-100%) Necrotic Tissue: Eschar Eschar, Adherent Slough Eschar, Adherent Slough Exposed Structures: Fascia: No Fascia: No Fascia: No Fat: No Fat: No Fat: No Tendon: No Tendon: No Tendon: No Muscle: No Muscle: No Muscle: No Joint: No Joint: No Joint: No Bone: No Bone: No Bone: No Limited to Skin Limited to Skin Limited to Skin Breakdown Breakdown Breakdown Epithelialization: None None None Periwound Skin Texture: Edema: Yes Edema: Yes Edema: Yes Excoriation: No Excoriation: No Excoriation: No Induration: No Induration: No Induration: No Callus: No Callus: No Callus: No Crepitus: No Crepitus: No Crepitus: No Fluctuance: No Fluctuance: No Fluctuance: No Friable: No Friable: No Friable: No Rash: No Rash: No Rash: No Scarring: No Scarring: No Scarring: No Periwound Skin Dry/Scaly: Yes Moist: Yes Moist: Yes Moisture: Maceration: No Maceration: No Dry/Scaly: Yes Moist: No Dry/Scaly: No Maceration: No Periwound Skin Color: Atrophie Blanche: No Atrophie Blanche: No Atrophie Blanche: No Cyanosis: No Cyanosis: No Cyanosis: No Ecchymosis: No Ecchymosis: No Ecchymosis: No Erythema: No Erythema: No Erythema: No Hemosiderin Staining: No Hemosiderin Staining: No Hemosiderin Staining: No Mottled: No Mottled: No Mottled: No Pallor: No Pallor: No Pallor: No Rubor: No Rubor: No Rubor: No Temperature: No Abnormality No Abnormality No Abnormality Tenderness on Yes No No Palpation: Wound  Preparation: Ulcer Cleansing: Ulcer Cleansing: Ulcer Cleansing: Rinsed/Irrigated with Rinsed/Irrigated with Rinsed/Irrigated with Saline Saline Saline Topical Anesthetic Topical Anesthetic Topical Anesthetic Applied: None Applied: None Applied: None Wound Number: 9 N/A N/A Photos: No Photos N/A N/A Wound Location: Left Calcaneous N/A N/A CAROLINE, LONGIE ([id number]) Wounding Event: Pressure Injury N/A N/A Primary Etiology: 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 Comorbid History: Anemia, Type I Diabetes, N/A N/A End Stage Renal Disease, Rheumatoid Arthritis, Neuropathy Date Acquired: 07/28/2015 N/A N/A Weeks of Treatment: 2 N/A N/A Wound Status: Open N/A N/A Clustered Wound: No N/A N/A Pending Amputation on No N/A N/A Presentation: Measurements L x W x D 2.9x7.3x0.1 N/A N/A (cm) Area (cm) : 16.627 N/A N/A Volume (cm) : 1.663 N/A N/A % Reduction in Area: 10.50% N/A N/A % Reduction in Volume: 10.40% N/A N/A Classification: Grade 2 N/A N/A Exudate Amount: Small N/A N/A Exudate Type: Serous N/A N/A Exudate Color: amber N/A N/A Foul Odor After Yes N/A N/A Cleansing: Odor Anticipated Due to No N/A N/A Product Use: Wound Margin: Distinct, outline attached N/A N/A Granulation Amount: None Present (0%) N/A N/A Necrotic Amount: Large (67-100%) N/A N/A Necrotic Tissue: Eschar, Adherent Slough  N/A N/A Exposed Structures: Fascia: No N/A N/A Fat: No Tendon: No Muscle: No Joint: No Bone: No Limited to Skin Breakdown Epithelialization: None N/A N/A Periwound Skin Texture: Edema: No N/A N/A Excoriation: No Induration: No Callus: No Crepitus: No Fluctuance: No Friable: No Rash: No Scarring: No Styron, Masiel N. ([id number]) Periwound Skin Moist: Yes N/A N/A Moisture: Maceration: No Dry/Scaly: No Periwound Skin Color: Atrophie Blanche: No N/A N/A Cyanosis: No Ecchymosis: No Erythema: No Hemosiderin Staining: No Mottled: No Pallor:  No Rubor: No Temperature: No Abnormality N/A N/A Tenderness on Yes N/A N/A Palpation: Wound Preparation: Ulcer Cleansing: N/A N/A Rinsed/Irrigated with Saline Topical Anesthetic Applied: Other: lidocaine 4% Treatment Notes Electronic Signature(s) Signed: 09/19/2015 5:45:13 PM By: [name] Entered By: [name] on 09/19/2015 15:08:13 [name] ([id number]) -------------------------------------------------------------------------------- Multi-Disciplinary Care Plan Details NEVAEN, TREDWAY 09/19/2015 2:15 Patient Name: Date of Service: N. PM Medical Record Patient Account Number: [id number] Number: Treating RN: Phillis Haggis Date of Birth/Sex: 28-Jul-1981 (34 y.o. Female) Other Clinician: Primary Care Physician: [name] Treating [name] Referring Physician: [name] Physician/Extender: Tania Ade in Treatment: 2 Active Inactive HBO Nursing Diagnoses: Anxiety related to feelings of confinement associated with the hyperbaric oxygen chamber Anxiety related to knowledge deficit of hyperbaric oxygen therapy and treatment procedures Discomfort related to temperature and humidity changes inside hyperbaric chamber Potential for barotraumas to ears, sinuses, teeth, and lungs or cerebral gas embolism related to changes in atmospheric pressure inside hyperbaric oxygen chamber Potential for oxygen toxicity seizures related to delivery of 100% oxygen at an increased atmospheric pressure Potential for pulmonary oxygen toxicity related to delivery of 100% oxygen at an increased atmospheric pressure Goals: Barotrauma will be prevented during HBO2 Date Initiated: 08/30/2015 Goal Status: Active Patient and/or family will be able to state/discuss factors appropriate to the management of their disease process during treatment Date Initiated: 08/30/2015 Goal Status: Active Patient will tolerate the hyperbaric oxygen therapy  treatment Date Initiated: 08/30/2015 Goal Status: Active Patient will tolerate the internal climate of the chamber Date Initiated: 08/30/2015 Goal Status: Active Patient/caregiver will verbalize understanding of HBO goals, rationale, procedures and potential hazards Date Initiated: 08/30/2015 Goal Status: Active Signs and symptoms of pulmonary oxygen toxicity will be recognized and promptly addressed Date Initiated: 08/30/2015 Goal Status: Active AVNI, TRAORE ([id number]) Signs and symptoms of seizure will be recognized and promptly addressed ; seizing patients will suffer no harm Date Initiated: 08/30/2015 Goal Status: Active Interventions: Administer a five (5) minute air break for patient if signs and symptoms of seizure appear and notify the hyperbaric physician Administer a ten (10) minute air break for patient if signs and symptoms of seizure appear and notify the hyperbaric physician Administer decongestants, per physician orders, prior to HBO2 Administer the correct therapeutic gas delivery based on the patients needs and limitations, per physician order Assess and provide for patientos comfort related to the hyperbaric environment and equalization of middle ear Assess for signs and symptoms related to adverse events, including but not limited to confinement anxiety, pneumothorax, oxygen toxicity and baurotrauma Assess patient for any history of confinement anxiety Assess patient's knowledge and expectations regarding hyperbaric medicine and provide education related to the hyperbaric environment, goals of treatment and prevention of adverse events Implement protocols to decrease risk of pneumothorax in high risk patients Notes: Abuse / Safety / Falls / Self Care Management Nursing Diagnoses: Potential for falls Goals: Patient will remain injury free Date Initiated: 09/19/2015 Goal Status: Active Patient/caregiver will verbalize/demonstrate measures taken to  prevent injury  and/or falls Date Initiated: 09/19/2015 Goal Status: Active Interventions: Assess fall risk on admission and as needed Assess impairment of mobility on admission and as needed per policy Notes: Orientation to the Wound Care Program Nursing Diagnoses: Knowledge deficit related to the wound healing center program KEYALA, CHIZMAR ([id number]) Goals: Patient/caregiver will verbalize understanding of the Wound Healing Center Program Date Initiated: 08/30/2015 Goal Status: Active Interventions: Provide education on orientation to the wound center Notes: Pressure Nursing Diagnoses: Knowledge deficit related to causes and risk factors for pressure ulcer development Knowledge deficit related to management of pressures ulcers Potential for impaired tissue integrity related to pressure, friction, moisture, and shear Goals: Patient will remain free from development of additional pressure ulcers Date Initiated: 08/30/2015 Goal Status: Active Patient will remain free of pressure ulcers Date Initiated: 08/30/2015 Goal Status: Active Patient/caregiver will verbalize risk factors for pressure ulcer development Date Initiated: 08/30/2015 Goal Status: Active Patient/caregiver will verbalize understanding of pressure ulcer management Date Initiated: 08/30/2015 Goal Status: Active Interventions: Assess: immobility, friction, shearing, incontinence upon admission and as needed Assess offloading mechanisms upon admission and as needed Assess potential for pressure ulcer upon admission and as needed Provide education on pressure ulcers Treatment Activities: Patient referred for home evaluation of offloading devices/mattresses : 09/19/2015 Patient referred for pressure reduction/relief devices : 09/19/2015 Pressure reduction/relief device ordered : 09/19/2015 Notes: Wound/Skin Impairment TAYA, ENGELHARDT ([id number]) Nursing Diagnoses: Impaired tissue integrity Knowledge  deficit related to ulceration/compromised skin integrity Goals: Patient will have a decrease in wound volume by X% from date: (specify in notes) Date Initiated: 08/30/2015 Goal Status: Active Patient/caregiver will verbalize understanding of skin care regimen Date Initiated: 08/30/2015 Goal Status: Active Ulcer/skin breakdown will have a volume reduction of 30% by week 4 Date Initiated: 08/30/2015 Goal Status: Active Ulcer/skin breakdown will have a volume reduction of 50% by week 8 Date Initiated: 08/30/2015 Goal Status: Active Ulcer/skin breakdown will have a volume reduction of 80% by week 12 Date Initiated: 08/30/2015 Goal Status: Active Ulcer/skin breakdown will heal within 14 weeks Date Initiated: 08/30/2015 Goal Status: Active Interventions: Assess patient/caregiver ability to obtain necessary supplies Assess patient/caregiver ability to perform ulcer/skin care regimen upon admission and as needed Assess ulceration(s) every visit Provide education on ulcer and skin care Notes: Electronic Signature(s) Signed: 09/19/2015 5:45:13 PM By: [name] Entered By: [name] on 09/19/2015 17:30:16 [name] ([id number]) -------------------------------------------------------------------------------- Pain Assessment Details MARLEE, BOTTARO 09/19/2015 2:15 Patient Name: Date of Service: N. PM Medical Record Patient Account Number: [id number] Number: Treating RN: Phillis Haggis Date of Birth/Sex: [birth year] (34 y.o. Female) Other Clinician: Primary Care Physician: [name] Treating [name] Referring Physician: [name] Physician/Extender: Tania Ade in Treatment: 2 Active Problems Location of Pain Severity and Description of Pain Patient Has Paino Yes Site Locations Pain Location: Generalized Pain Duration of the Pain. Constant / Intermittento Constant Rate the pain. Current Pain Level: 10 Character of Pain Describe the  Pain: Aching Pain Management and Medication Current Pain Management: Electronic Signature(s) Signed: 09/19/2015 5:45:13 PM By: [name] Entered By: [name] on 09/19/2015 14:40:34 [name] ([id number]) -------------------------------------------------------------------------------- Patient/Caregiver Education Details VICY, BRANA 09/19/2015 2:15 Patient Name: Date of Service: N. PM Medical Record Patient Account Number: [id number] Number: Treating RN: Phillis Haggis Date of Birth/Gender: 1981/06/23 (34 y.o. Female) Other Clinician: Primary Care Physician: [name] Treating [name] Referring Physician: [name] Physician/Extender: Tania Ade in Treatment: 2 Education Assessment Education Provided To: Patient and Caregiver Education Topics Provided Wound/Skin Impairment: Handouts: Other: change dressings  as directed Methods: Demonstration, Explain/Verbal Responses: State content correctly Electronic Signature(s) Signed: 09/19/2015 5:45:13 PM By: [name] Entered By: [name] on 09/19/2015 15:48:15 [name] ([id number]) -------------------------------------------------------------------------------- Wound Assessment Details LATESIA, NORRINGTON 09/19/2015 2:15 Patient Name: Date of Service: N. PM Medical Record Patient Account Number: [id number] Number: Treating RN: Phillis Haggis Date of Birth/Sex: 01-Oct-1980 (34 y.o. Female) Other Clinician: Primary Care Physician: [name] Treating [name] Referring Physician: [name] Physician/Extender: Tania Ade in Treatment: 2 Wound Status Wound Number: 10 Primary Diabetic Wound/Ulcer of the Lower Etiology: Extremity Wound Location: Right Calcaneous - Circumfernential Wound Open Status: Wounding Event: Pressure Injury Comorbid Anemia, Type I Diabetes, End Stage Date Acquired: 07/28/2015 History: Renal Disease,  Rheumatoid Arthritis, Weeks Of Treatment: 2 Neuropathy Clustered Wound: No Pending Amputation On Presentation Photos Photo Uploaded By: [name] on 09/19/2015 16:46:03 Wound Measurements Length: (cm) 8.6 Width: (cm) 8.6 Depth: (cm) 0.1 Area: (cm) 58.088 Volume: (cm) 5.809 % Reduction in Area: -73% % Reduction in Volume: -73% Epithelialization: None Tunneling: No Undermining: No Wound Description Classification: Unable to visualize wound bed Foul Odor Afte Wound Margin: Distinct, outline attached Exudate Amount: None Present r Cleansing: No Wound Bed Granulation Amount: None Present (0%) Exposed Structure Necrotic Amount: Large (67-100%) Fascia Exposed: No Necrotic Quality: Eschar Fat Layer Exposed: No Crandle, Francena N. ([id number]) Tendon Exposed: No Muscle Exposed: No Joint Exposed: No Bone Exposed: No Limited to Skin Breakdown Periwound Skin Texture Texture Color No Abnormalities Noted: No No Abnormalities Noted: No Callus: No Atrophie Blanche: No Crepitus: No Cyanosis: No Excoriation: No Ecchymosis: No Fluctuance: No Erythema: No Friable: No Hemosiderin Staining: No Induration: No Mottled: No Localized Edema: Yes Pallor: No Rash: No Rubor: No Scarring: No Temperature / Pain Moisture Temperature: No Abnormality No Abnormalities Noted: No Tenderness on Palpation: Yes Dry / Scaly: Yes Maceration: No Moist: No Wound Preparation Ulcer Cleansing: Rinsed/Irrigated with Saline Topical Anesthetic Applied: None Treatment Notes Wound #10 (Right, Circumferential Calcaneous) 1. Cleansed with: Clean wound with Normal Saline 2. Anesthetic Topical Lidocaine 4% cream to wound bed prior to debridement 4. Dressing Applied: Aquacel Ag Other dressing (specify in notes) 5. Secondary Dressing Applied Gauze and Kerlix/Conform 6. Footwear/Offloading device applied Other footwear/offloading device applied (specify in notes) Notes darco/ betadine  paint Electronic Signature(s) Signed: 09/19/2015 5:45:13 PM By: Valera Castle, Ledell Peoples ([id number]) Entered By: [name] on 09/19/2015 15:03:59 [name] ([id number]) -------------------------------------------------------------------------------- Wound Assessment Details FLOETTA, BRICKEY 09/19/2015 2:15 Patient Name: Date of Service: N. PM Medical Record Patient Account Number: [id number] Number: Treating RN: Phillis Haggis Date of Birth/Sex: [birth year] (34 y.o. Female) Other Clinician: Primary Care Physician: [name] Treating [name] Referring Physician: [name] Physician/Extender: Tania Ade in Treatment: 2 Wound Status Wound Number: 12 Primary Diabetic Wound/Ulcer of the Lower Etiology: Extremity Wound Location: Left, Plantar, Circumferential Foot Wound Open Status: Wounding Event: Gradually Appeared Comorbid Anemia, Type I Diabetes, End Stage Date Acquired: 07/28/2015 History: Renal Disease, Rheumatoid Arthritis, Weeks Of Treatment: 2 Neuropathy Clustered Wound: No Pending Amputation On Presentation Photos Photo Uploaded By: [name] on 09/19/2015 16:46:42 Wound Measurements Length: (cm) 11 Width: (cm) 14 Depth: (cm) 0.2 Area: (cm) 120.951 Volume: (cm) 24.19 % Reduction in Area: -180% % Reduction in Volume: -460% Epithelialization: None Tunneling: No Undermining: No Wound Description Classification: Grade 2 Wound Margin: Indistinct, nonvisible Exudate Amount: Large Exudate Type: Serous Exudate Color: amber Foul Odor After Cleansing: Yes Due to Product Use: No Wound Bed Granulation Amount: None Present (0%) Exposed Structure Coyt, Deboraha N. (  [id number]) Necrotic Amount: Large (67-100%) Fascia Exposed: No Necrotic Quality: Eschar, Adherent Slough Fat Layer Exposed: No Tendon Exposed: No Muscle Exposed: No Joint Exposed: No Bone Exposed: No Limited to Skin  Breakdown Periwound Skin Texture Texture Color No Abnormalities Noted: No No Abnormalities Noted: No Callus: No Atrophie Blanche: No Crepitus: No Cyanosis: No Excoriation: No Ecchymosis: No Fluctuance: No Erythema: No Friable: No Hemosiderin Staining: No Induration: No Mottled: No Localized Edema: Yes Pallor: No Rash: No Rubor: No Scarring: No Temperature / Pain Moisture Temperature: No Abnormality No Abnormalities Noted: No Dry / Scaly: No Maceration: No Moist: Yes Wound Preparation Ulcer Cleansing: Rinsed/Irrigated with Saline Topical Anesthetic Applied: None Treatment Notes Wound #12 (Left, Plantar, Circumferential Foot) 1. Cleansed with: Clean wound with Normal Saline 2. Anesthetic Topical Lidocaine 4% cream to wound bed prior to debridement 4. Dressing Applied: Aquacel Ag Other dressing (specify in notes) 5. Secondary Dressing Applied Gauze and Kerlix/Conform 6. Footwear/Offloading device applied Other footwear/offloading device applied (specify in notes) Notes darco/ betadine paint Elwood, Sunny N. ([id number]) Electronic Signature(s) Signed: 09/19/2015 5:45:13 PM By: [name] Entered By: [name] on 09/19/2015 15:00:46 [name] ([id number]) -------------------------------------------------------------------------------- Wound Assessment Details MIMIE, BARZEE 09/19/2015 2:15 Patient Name: Date of Service: N. PM Medical Record Patient Account Number: [id number] Number: Treating RN: Phillis Haggis Date of Birth/Sex: 15-Apr-1981 (34 y.o. Female) Other Clinician: Primary Care Physician: [name] Treating [name] Referring Physician: [name] Physician/Extender: Tania Ade in Treatment: 2 Wound Status Wound Number: 14 Primary Diabetic Wound/Ulcer of the Lower Etiology: Extremity Wound Location: Right Foot - Plantar, Circumfernential Wound Open Status: Wounding Event: Gradually  Appeared Comorbid Anemia, Type I Diabetes, End Stage Date Acquired: 07/28/2015 History: Renal Disease, Rheumatoid Arthritis, Weeks Of Treatment: 2 Neuropathy Clustered Wound: Yes Pending Amputation On Presentation Photos Photo Uploaded By: [name] on 09/19/2015 16:47:35 Wound Measurements Length: (cm) 12.6 Width: (cm) 16.5 Depth: (cm) 0.1 Area: (cm) 163.284 Volume: (cm) 16.328 % Reduction in Area: -197% % Reduction in Volume: -197% Epithelialization: None Tunneling: No Undermining: No Wound Description Classification: Unable to visualize wound bed Foul Odor Afte Wound Margin: Indistinct, nonvisible Due to Product Exudate Amount: Medium Exudate Type: Serous Exudate Color: amber r Cleansing: Yes Use: No Wound Bed Granulation Amount: None Present (0%) Exposed Structure Mcgloin, Ezzie N. ([id number]) Necrotic Amount: Large (67-100%) Fascia Exposed: No Necrotic Quality: Eschar, Adherent Slough Fat Layer Exposed: No Tendon Exposed: No Muscle Exposed: No Joint Exposed: No Bone Exposed: No Limited to Skin Breakdown Periwound Skin Texture Texture Color No Abnormalities Noted: No No Abnormalities Noted: No Callus: No Atrophie Blanche: No Crepitus: No Cyanosis: No Excoriation: No Ecchymosis: No Fluctuance: No Erythema: No Friable: No Hemosiderin Staining: No Induration: No Mottled: No Localized Edema: Yes Pallor: No Rash: No Rubor: No Scarring: No Temperature / Pain Moisture Temperature: No Abnormality No Abnormalities Noted: No Dry / Scaly: Yes Maceration: No Moist: Yes Wound Preparation Ulcer Cleansing: Rinsed/Irrigated with Saline Topical Anesthetic Applied: None Treatment Notes Wound #14 (Right, Plantar, Circumferential Foot) 1. Cleansed with: Clean wound with Normal Saline 2. Anesthetic Topical Lidocaine 4% cream to wound bed prior to debridement 4. Dressing Applied: Aquacel Ag Other dressing (specify in notes) 5. Secondary  Dressing Applied Gauze and Kerlix/Conform 6. Footwear/Offloading device applied Other footwear/offloading device applied (specify in notes) Notes darco/ betadine paint Humphrey, Andrena N. ([id number]) Electronic Signature(s) Signed: 09/19/2015 5:45:13 PM By: [name] Entered By: [name] on 09/19/2015 15:06:16 [name] ([id number]) -------------------------------------------------------------------------------- Wound Assessment Details TMYA, PIZZOFERRATO 09/19/2015 2:15 Patient Name:  Date of Service: N. PM Medical Record Patient Account Number: [id number] Number: Treating RN: Phillis Haggis Date of Birth/Sex: [birth year] (34 y.o. Female) Other Clinician: Primary Care Physician: [name] Treating [name] Referring Physician: [name] Physician/Extender: Tania Ade in Treatment: 2 Wound Status Wound Number: 9 Primary Diabetic Wound/Ulcer of the Lower Etiology: Extremity Wound Location: Left Calcaneous Wound Open Wounding Event: Pressure Injury Status: Date Acquired: 07/28/2015 Comorbid Anemia, Type I Diabetes, End Stage Weeks Of Treatment: 2 History: Renal Disease, Rheumatoid Arthritis, Clustered Wound: No Neuropathy Photos Photo Uploaded By: [name] on 09/19/2015 16:46:03 Wound Measurements Length: (cm) 2.9 Width: (cm) 7.3 Depth: (cm) 0.1 Area: (cm) 16.627 Volume: (cm) 1.663 % Reduction in Area: 10.5% % Reduction in Volume: 10.4% Epithelialization: None Tunneling: No Undermining: No Wound Description Classification: Grade 2 Foul Odor Afte Wound Margin: Distinct, outline attached Due to Product Exudate Amount: Small Exudate Type: Serous Exudate Color: amber r Cleansing: Yes Use: No Wound Bed Granulation Amount: None Present (0%) Exposed Structure Necrotic Amount: Large (67-100%) Fascia Exposed: No Barthel, Greenley N. ([id number]) Necrotic Quality: Eschar, Adherent Slough Fat Layer Exposed:  No Tendon Exposed: No Muscle Exposed: No Joint Exposed: No Bone Exposed: No Limited to Skin Breakdown Periwound Skin Texture Texture Color No Abnormalities Noted: No No Abnormalities Noted: No Callus: No Atrophie Blanche: No Crepitus: No Cyanosis: No Excoriation: No Ecchymosis: No Fluctuance: No Erythema: No Friable: No Hemosiderin Staining: No Induration: No Mottled: No Localized Edema: No Pallor: No Rash: No Rubor: No Scarring: No Temperature / Pain Moisture Temperature: No Abnormality No Abnormalities Noted: No Tenderness on Palpation: Yes Dry / Scaly: No Maceration: No Moist: Yes Wound Preparation Ulcer Cleansing: Rinsed/Irrigated with Saline Topical Anesthetic Applied: Other: lidocaine 4%, Treatment Notes Wound #9 (Left Calcaneous) 1. Cleansed with: Clean wound with Normal Saline 2. Anesthetic Topical Lidocaine 4% cream to wound bed prior to debridement 4. Dressing Applied: Aquacel Ag Other dressing (specify in notes) 5. Secondary Dressing Applied Gauze and Kerlix/Conform 6. Footwear/Offloading device applied Other footwear/offloading device applied (specify in notes) Notes darco/ betadine paint Electronic Signature(s) RAIGAN, BARIA ([id number]) Signed: 09/19/2015 5:45:13 PM By: [name] Entered By: [name] on 09/19/2015 14:53:22 [name] ([id number]) -------------------------------------------------------------------------------- Vitals Details DOLLINGER, Naijah 09/19/2015 2:15 Patient Name: Date of Service: N. PM Medical Record Patient Account Number: [id number] Number: Treating RN: Phillis Haggis Date of Birth/Sex: June 02, 1981 (34 y.o. Female) Other Clinician: Primary Care Physician: [name] Treating [name] Referring Physician: [name] Physician/Extender: Tania Ade in Treatment: 2 Vital Signs Time Taken: 14:40 Pulse (bpm): 81 Respiratory Rate (breaths/min): 18 Blood  Pressure (mmHg): 98/64 Reference Range: 80 - 120 mg / dl Electronic Signature(s) Signed: 09/19/2015 5:45:13 PM By: [name] Entered By: [name] on 09/19/2015 14:43:44

## 2015-09-24 ENCOUNTER — Encounter: Payer: Medicare Other | Admitting: General Surgery

## 2015-09-24 ENCOUNTER — Ambulatory Visit: Payer: Medicare Other | Admitting: General Surgery

## 2015-09-25 ENCOUNTER — Encounter: Payer: Medicare Other | Admitting: Surgery

## 2015-09-25 ENCOUNTER — Ambulatory Visit
Admission: RE | Admit: 2015-09-25 | Discharge: 2015-09-25 | Disposition: A | Discharge status: Home / Self Care | Payer: Medicare Other | Source: Ambulatory Visit | Attending: Family Medicine | Admitting: Family Medicine

## 2015-09-25 ENCOUNTER — Ambulatory Visit: Payer: Medicare Other

## 2015-09-25 ENCOUNTER — Other Ambulatory Visit: Payer: Self-pay | Admitting: Family Medicine

## 2015-09-25 DIAGNOSIS — S32511A Fracture of superior rim of right pubis, initial encounter for closed fracture: Secondary | ICD-10-CM | POA: Diagnosis not present

## 2015-09-25 DIAGNOSIS — W19XXXA Unspecified fall, initial encounter: Secondary | ICD-10-CM | POA: Insufficient documentation

## 2015-09-25 DIAGNOSIS — M25551 Pain in right hip: Secondary | ICD-10-CM

## 2015-09-25 DIAGNOSIS — S32592A Other specified fracture of left pubis, initial encounter for closed fracture: Secondary | ICD-10-CM | POA: Diagnosis not present

## 2015-09-26 ENCOUNTER — Encounter: Payer: Medicare Other | Admitting: General Surgery

## 2015-09-27 ENCOUNTER — Encounter: Payer: Medicare Other | Admitting: General Surgery

## 2015-10-01 ENCOUNTER — Encounter: Payer: Medicare Other | Attending: Surgery | Admitting: Surgery

## 2015-10-01 DIAGNOSIS — Z794 Long term (current) use of insulin: Secondary | ICD-10-CM | POA: Diagnosis present

## 2015-10-01 DIAGNOSIS — R600 Localized edema: Secondary | ICD-10-CM | POA: Insufficient documentation

## 2015-10-01 DIAGNOSIS — K589 Irritable bowel syndrome without diarrhea: Secondary | ICD-10-CM | POA: Diagnosis not present

## 2015-10-01 DIAGNOSIS — L02611 Cutaneous abscess of right foot: Secondary | ICD-10-CM | POA: Insufficient documentation

## 2015-10-01 DIAGNOSIS — L89622 Pressure ulcer of left heel, stage 2: Secondary | ICD-10-CM | POA: Insufficient documentation

## 2015-10-01 DIAGNOSIS — E1065 Type 1 diabetes mellitus with hyperglycemia: Secondary | ICD-10-CM | POA: Diagnosis not present

## 2015-10-01 DIAGNOSIS — M459 Ankylosing spondylitis of unspecified sites in spine: Secondary | ICD-10-CM | POA: Diagnosis not present

## 2015-10-01 DIAGNOSIS — I70261 Atherosclerosis of native arteries of extremities with gangrene, right leg: Secondary | ICD-10-CM | POA: Diagnosis not present

## 2015-10-01 DIAGNOSIS — E1152 Type 2 diabetes mellitus with diabetic peripheral angiopathy with gangrene: Secondary | ICD-10-CM | POA: Diagnosis not present

## 2015-10-01 DIAGNOSIS — F5000 Anorexia nervosa, unspecified: Secondary | ICD-10-CM | POA: Diagnosis not present

## 2015-10-01 DIAGNOSIS — E1022 Type 1 diabetes mellitus with diabetic chronic kidney disease: Secondary | ICD-10-CM | POA: Insufficient documentation

## 2015-10-01 DIAGNOSIS — N189 Chronic kidney disease, unspecified: Secondary | ICD-10-CM | POA: Insufficient documentation

## 2015-10-01 DIAGNOSIS — E10621 Type 1 diabetes mellitus with foot ulcer: Secondary | ICD-10-CM | POA: Diagnosis not present

## 2015-10-01 DIAGNOSIS — I70245 Atherosclerosis of native arteries of left leg with ulceration of other part of foot: Secondary | ICD-10-CM | POA: Diagnosis not present

## 2015-10-02 NOTE — Progress Notes (Signed)
[name], [name] ([id number]) Visit Report for 10/01/2015 Fall Risk Assessment Details TERA, PELLICANE Date of Service: 10/01/2015 3:30 PM Patient Name: N. Patient Account Number: [id number] Medical Record [name], RN, [name], [id number] Treating RN: Number: [name] [birth year] (34 y.o. Other Clinician: Date of Birth/Sex: Female) Treating Britto, [name] Primary Care Physician: [name] Physician/Extender: Referring Physician: [name] in Treatment: 4 Fall Risk Assessment Items Have you had 2 or more falls in the last 12 monthso 0 Yes Have you had any fall that resulted in injury in the last 12 monthso 0 Yes FALL RISK ASSESSMENT: History of falling - immediate or within 3 months 25 Yes Secondary diagnosis 0 No Ambulatory aid None/bed rest/wheelchair/nurse 0 No Crutches/cane/walker 15 Yes Furniture 0 No IV Access/Saline Lock 0 No Gait/Training Normal/bed rest/immobile 0 No Weak 10 Yes Impaired 0 No Mental Status Oriented to own ability 0 Yes Electronic Signature(s) Signed: 10/01/2015 5:08:02 PM By: Elpidio Eric [name], RN Entered By: Elpidio Eric on 10/01/2015 16:25:23

## 2015-10-02 NOTE — Progress Notes (Signed)
NEEVA, TREW ([id number]) Visit Report for 10/01/2015 Chief Complaint Document Details Patient Name: [name], [name] Date of Service: 10/01/2015 3:30 PM Medical Record Number: [id number] Patient Account Number: [id number] Date of Birth/Sex: Jul 22, 1981 (35 y.o. Female) [name]: Primary Care Physician: [name] Other Clinician: Referring Physician: [name] Treating Physician/Extender: Rudene Re in Treatment: 4 Information Obtained from: Patient Chief Complaint Patient in today for treatment of non-healing wound and HBO Treatment. she has just gotten out of hospital this week and is back to resume her hyperbaric oxygen therapy Electronic Signature(s) Signed: 10/01/2015 4:24:10 PM By: [name] MD, FACS Entered By: [name] on 10/01/2015 16:24:10 [name] ([id number]) -------------------------------------------------------------------------------- HPI Details Patient Name: [name] Date of Service: 10/01/2015 3:30 PM Medical Record Number: [id number] Patient Account Number: [id number] Date of Birth/Sex: 1981-01-10 (35 y.o. Female) [name]: Primary Care Physician: [name] Other Clinician: Referring Physician: [name] Treating Physician/Extender: Rudene Re in Treatment: 4 History of Present Illness Location: dry gangrene both feet and heels Quality: Patient reports No Pain. Severity: Patient states wound are getting worse. Duration: Patient has had the wound for > 30months prior to seeking treatment at the wound center Context: The wound appeared gradually over time Modifying Factors: she has been in and out of hospital over the last 2 months Associated Signs and Symptoms: Patient reports having difficulty standing for long periods. HPI Description: Tanyia Grabbe is a 35 y.o. female who presents to our wound center, back in June 2016, referred by her PCP Dr. Zada Finders for nonhealing ulcers on  the lateral aspect of the right heel. Of note she has a history of type 1 diabetes mellitus that has been uncontrolled. Past medical history significant for type 1 diabetes mellitus not controlled, ankylosing spondylitis, anorexia nervosa, irritable bowel syndrome, chronic kidney disease, chronic diarrhea. she then developed gangrene of both feet due to severe peripheral vascular disease and also had gangrene of the tips of her fingers due to upper extremity vascular disease. She was being worked up by vascular surgery at [facility] and at [facility] and has had several procedures done there. She started with hyperbaric oxygen therapy and had a total of 40 treatments the last one being on 06/20/2015. After the initial treatment of hyperbaric oxygen therapy she started having ear problems and had ultimately to use myringotomy tubes and this was done bilaterally. Since then her ears have been doing fine. In late September, she had seen vascular and hand surgeons. since then she's been in [place] at the rec center for surgery involving extensive vascular procedures for the upper extremities. She was then at [facility] with GI bleeds both upper and lower and has been in and out of hospital for that. She has recently been out of hospital for the last week. 09/09/2015 -- she was unable to get here in time to start her hyperbaric oxygen today and hence is only here for a wound care visit. 09/19/2015 -- she has been having vancomycin during her dialysis and continues to have vascular appointments and the procedure is been set for early January. She has been unable to make it for her hemodialysis due to various medical symptoms. 09/30/2014 -- her vancomycin was stopped on 09/25/2015 and the mother has noticed the right foot has started draining for the last 3 days. Addendum: after examining the patient I was able to talk to her primary vascular surgeon Dr. Pernell Dupre at the [facility]. I told him about the  necrotic area on  the plantar aspect of right foot which is now wet gangrene and he agreed with me that he would admit her at [facility] under her care and synchronize further treatment. We have discussed her poor prognosis and he and I discussed the need for hospice care and for sitting down and talking to the patient and her mother and giving them a proper detail of the prognosis. [name], [name] ([id number]) Electronic Signature(s) Signed: 10/01/2015 4:29:20 PM By: [name] MD, FACS Previous Signature: 10/01/2015 4:25:00 PM Version By: [name] MD, FACS Entered By: [name] on 10/01/2015 16:29:20 [name] ([id number]) -------------------------------------------------------------------------------- Physical Exam Details Patient Name: [name] Date of Service: 10/01/2015 3:30 PM Medical Record Number: [id number] Patient Account Number: [id number] Date of Birth/Sex: 01/30/1981 (35 y.o. Female) [name]: Primary Care Physician: [name] Other Clinician: Referring Physician: [name] Treating Physician/Extender: Rudene Re in Treatment: 4 Constitutional . Pulse regular. Respirations normal and unlabored. Afebrile. . Eyes Nonicteric. Reactive to light. Ears, Nose, Mouth, and Throat Lips, teeth, and gums WNL.Marland Kitchen Moist mucosa without lesions. Neck supple and nontender. No palpable supraclavicular or cervical adenopathy. Normal sized without goiter. Respiratory WNL. No retractions.. Cardiovascular Pedal Pulses WNL. No clubbing, cyanosis or edema. Lymphatic No adneopathy. No adenopathy. No adenopathy. Musculoskeletal Adexa without tenderness or enlargement.. Digits and nails w/o clubbing, cyanosis, infection, petechiae, ischemia, or inflammatory conditions.. Integumentary (Hair, Skin) No suspicious lesions. No crepitus or fluctuance. No peri-wound warmth or erythema. No masses.Marland Kitchen Psychiatric Judgement and insight Intact..  No evidence of depression, anxiety, or agitation.. Notes the plantar aspect of the right foot just proximal to the area where she had dry gangrene now has developed a large area of necrotic wet gangrene which is very odorous and has a fluctuant abscess. Electronic Signature(s) Signed: 10/01/2015 4:27:06 PM By: [name] MD, FACS Entered By: [name] on 10/01/2015 16:27:05 [name] ([id number]) -------------------------------------------------------------------------------- Physician Orders Details [name] Date of Service: 10/01/2015 3:30 PM Patient Name: N. Patient Account Number: [id number] Medical Record [name], [name], [name], [id number] [name]: Number: [place] Wu 11/18/1980 (34 y.o. Other Clinician: Date of Birth/Sex: Female) Treating Earnest Thalman Primary Care Physician: [name] Physician/Extender: Referring Physician: [name] in Treatment: 4 Verbal / Phone Orders: Yes Clinician: [name], [name], [name], [name] Read Back and Verified: Yes Diagnosis Coding Wound Cleansing Wound #10 Right,Circumferential Calcaneous o Cleanse wound with mild soap and water Wound #12 Left,Plantar,Circumferential Foot o Cleanse wound with mild soap and water Wound #14 Right,Plantar,Circumferential Foot o Cleanse wound with mild soap and water Wound #9 Left Calcaneous o Cleanse wound with mild soap and water Primary Wound Dressing Wound #10 Right,Circumferential Calcaneous o Other: - betadine paint Wound #12 Left,Plantar,Circumferential Foot o Other: - betadine paint Wound #14 Right,Plantar,Circumferential Foot o Other: - betadine paint Wound #9 Left Calcaneous o Other: - betadine paint Secondary Dressing Wound #10 Right,Circumferential Calcaneous o Gauze and Kerlix/Conform Wound #12 Left,Plantar,Circumferential Foot o Gauze and Kerlix/Conform Wound #14 Right,Plantar,Circumferential Foot Gillooly, Lynnzie N. ([id number]) o Gauze and  Kerlix/Conform Wound #9 Left Calcaneous o Gauze and Kerlix/Conform Dressing Change Frequency Wound #10 Right,Circumferential Calcaneous o Change dressing every day. Wound #12 Left,Plantar,Circumferential Foot o Change dressing every day. Wound #14 Right,Plantar,Circumferential Foot o Change dressing every day. Wound #9 Left Calcaneous o Change dressing every day. Follow-up Appointments Wound #10 Right,Circumferential Calcaneous o Return Appointment in: - When released from the hospital Wound #12 Left,Plantar,Circumferential Foot o Return Appointment in: - When released from the hospital Wound #14 Right,Plantar,Circumferential Foot o  Return Appointment in: - When released from the hospital Wound #9 Left Calcaneous o Return Appointment in: - When released from the hospital Off-Loading Wound #10 Right,Circumferential Calcaneous o Open toe surgical shoe with peg assist. Wound #12 Left,Plantar,Circumferential Foot o Open toe surgical shoe with peg assist. Wound #14 Right,Plantar,Circumferential Foot o Open toe surgical shoe with peg assist. Wound #9 Left Calcaneous o Open toe surgical shoe with peg assist. Additional Orders / Instructions Wound #10 Right,Circumferential Calcaneous o Other: - MD spoke with Dr. Pernell Dupre; vascular surgeon at [facility]. Patient will be direct admitted to the hospital tonight for surgical removal of abscess to the right plantar foot. [name], [name] ([id number]) Wound #12 Left,Plantar,Circumferential Foot o Other: - MD spoke with Dr. Pernell Dupre; vascular surgeon at [facility]. Patient will be direct admitted to the hospital tonight for surgical removal of abscess to the right plantar foot. Wound #14 Right,Plantar,Circumferential Foot o Other: - MD spoke with Dr. Pernell Dupre; vascular surgeon at [facility]. Patient will be direct admitted to the hospital tonight for surgical removal of abscess to the right plantar  foot. Wound #9 Left Calcaneous o Other: - MD spoke with Dr. Pernell Dupre; vascular surgeon at [facility]. Patient will be direct admitted to the hospital tonight for surgical removal of abscess to the right plantar foot. Electronic Signature(s) Signed: 10/01/2015 4:44:22 PM By: [name] MD, FACS Signed: 10/01/2015 5:08:02 PM By: Elpidio Eric [name], [name] Entered By: Elpidio Eric on 10/01/2015 16:23:43 [name] ([id number]) -------------------------------------------------------------------------------- Problem List Details Patient Name: [name] Date of Service: 10/01/2015 3:30 PM Medical Record Number: [id number] Patient Account Number: [id number] Date of Birth/Sex: 13-Oct-1980 (34 y.o. Female) [name]: Primary Care Physician: [name] Other Clinician: Referring Physician: [name] Treating Physician/Extender: Rudene Re in Treatment: 4 Active Problems ICD-10 Encounter Code Description Active Date Diagnosis E10.621 Type 1 diabetes mellitus with foot ulcer 08/30/2015 Yes E10.52 Type 1 diabetes mellitus with diabetic peripheral 08/30/2015 Yes angiopathy with gangrene I70.245 Atherosclerosis of native arteries of left leg with ulceration 08/30/2015 Yes of other part of foot I70.261 Atherosclerosis of native arteries of extremities with 08/30/2015 Yes gangrene, right leg L89.622 Pressure ulcer of left heel, stage 2 08/30/2015 Yes L02.611 Cutaneous abscess of right foot 10/01/2015 Yes Inactive Problems Resolved Problems Electronic Signature(s) Signed: 10/01/2015 4:24:00 PM By: [name] MD, FACS Entered By: [name] on 10/01/2015 16:23:59 [name] ([id number]) -------------------------------------------------------------------------------- Progress Note Details [name] Date of Service: 10/01/2015 3:30 PM Patient Name: N. Patient Account Number: [id number] Medical Record [name], [name], [name], [id number] [name]: Number:  Molino Wu 04-13-1981 (34 y.o. Other Clinician: Date of Birth/Sex: Female) Treating Jocsan Mcginley Primary Care Physician: [name] Physician/Extender: Referring Physician: [name] in Treatment: 4 Subjective Chief Complaint Information obtained from Patient Patient in today for treatment of non-healing wound and HBO Treatment. she has just gotten out of hospital this week and is back to resume her hyperbaric oxygen therapy History of Present Illness (HPI) The following HPI elements were documented for the patient's wound: Location: dry gangrene both feet and heels Quality: Patient reports No Pain. Severity: Patient states wound are getting worse. Duration: Patient has had the wound for > 4months prior to seeking treatment at the wound center Context: The wound appeared gradually over time Modifying Factors: she has been in and out of hospital over the last 2 months Associated Signs and Symptoms: Patient reports having difficulty standing for long periods. [name] is a 35 y.o. female who presents to  our wound center, back in June 2016, referred by her PCP Dr. Zada Finders for nonhealing ulcers on the lateral aspect of the right heel. Of note she has a history of type 1 diabetes mellitus that has been uncontrolled. Past medical history significant for type 1 diabetes mellitus not controlled, ankylosing spondylitis, anorexia nervosa, irritable bowel syndrome, chronic kidney disease, chronic diarrhea. she then developed gangrene of both feet due to severe peripheral vascular disease and also had gangrene of the tips of her fingers due to upper extremity vascular disease. She was being worked up by vascular surgery at [facility] and at [facility] and has had several procedures done there. She started with hyperbaric oxygen therapy and had a total of 40 treatments the last one being on 06/20/2015. After the initial treatment of hyperbaric oxygen therapy she started having ear problems  and had ultimately to use myringotomy tubes and this was done bilaterally. Since then her ears have been doing fine. In late September, she had seen vascular and hand surgeons. since then she's been in [place] at the rec center for surgery involving extensive vascular procedures for the upper extremities. She was then at [facility] with GI bleeds both upper and lower and has been in and out of hospital for that. She has recently been out of hospital for the last week. 09/09/2015 -- she was unable to get here in time to start her hyperbaric oxygen today and hence is only here for a wound care visit. 09/19/2015 -- she has been having vancomycin during her dialysis and continues to have vascular appointments and the procedure is been set for early January. She has been unable to make it for her [name], [name] ([id number]) hemodialysis due to various medical symptoms. 09/30/2014 -- her vancomycin was stopped on 09/25/2015 and the mother has noticed the right foot has started draining for the last 3 days. Addendum: after examining the patient I was able to talk to her primary vascular surgeon Dr. Pernell Dupre at the [facility]. I told him about the necrotic area on the plantar aspect of right foot which is now wet gangrene and he agreed with me that he would admit her at [facility] under her care and synchronize further treatment. We have discussed her poor prognosis and he and I discussed the need for hospice care and for sitting down and talking to the patient and her mother and giving them a proper detail of the prognosis. Objective Constitutional Pulse regular. Respirations normal and unlabored. Afebrile. Vitals Time Taken: 3:34 PM, Temperature: 97.5 F, Pulse: 92 bpm, Respiratory Rate: 18 breaths/min, Blood Pressure: 92/59 mmHg. Eyes Nonicteric. Reactive to light. Ears, Nose, Mouth, and Throat Lips, teeth, and gums WNL.Marland Kitchen Moist mucosa without lesions. Neck supple and nontender. No  palpable supraclavicular or cervical adenopathy. Normal sized without goiter. Respiratory WNL. No retractions.. Cardiovascular Pedal Pulses WNL. No clubbing, cyanosis or edema. Lymphatic No adneopathy. No adenopathy. No adenopathy. Musculoskeletal Adexa without tenderness or enlargement.. Digits and nails w/o clubbing, cyanosis, infection, petechiae, ischemia, or inflammatory conditions.Marland Kitchen Psychiatric Judgement and insight Intact.. No evidence of depression, anxiety, or agitation.Marland Kitchen [name], [name] ([id number]) General Notes: the plantar aspect of the right foot just proximal to the area where she had dry gangrene now has developed a large area of necrotic wet gangrene which is very odorous and has a fluctuant abscess. Integumentary (Hair, Skin) No suspicious lesions. No crepitus or fluctuance. No peri-wound warmth or erythema. No masses.. Wound #10 status is Open. Original cause of wound was  Pressure Injury. The wound is located on the Right,Circumferential Calcaneous. The wound measures 9cm length x 9cm width x 0.1cm depth; 63.617cm^2 area and 6.362cm^3 volume. The wound is limited to skin breakdown. There is no tunneling or undermining noted. There is a none present amount of drainage noted. The wound margin is distinct with the outline attached to the wound base. There is no granulation within the wound bed. There is a large (67- 100%) amount of necrotic tissue within the wound bed including Eschar. The periwound skin appearance exhibited: Localized Edema, Dry/Scaly. The periwound skin appearance did not exhibit: Callus, Crepitus, Excoriation, Fluctuance, Friable, Induration, Rash, Scarring, Maceration, Moist, Atrophie Blanche, Cyanosis, Ecchymosis, Hemosiderin Staining, Mottled, Pallor, Rubor, Erythema. Periwound temperature was noted as No Abnormality. The periwound has tenderness on palpation. Wound #12 status is Open. Original cause of wound was Gradually Appeared. The wound is  located on the Left,Plantar,Circumferential Foot. The wound measures 11cm length x 14cm width x 0.2cm depth; 120.951cm^2 area and 24.19cm^3 volume. The wound is limited to skin breakdown. There is no tunneling or undermining noted. There is a large amount of serous drainage noted. The wound margin is indistinct and nonvisible. There is no granulation within the wound bed. There is a large (67-100%) amount of necrotic tissue within the wound bed including Eschar and Adherent Slough. The periwound skin appearance exhibited: Localized Edema, Moist. The periwound skin appearance did not exhibit: Callus, Crepitus, Excoriation, Fluctuance, Friable, Induration, Rash, Scarring, Dry/Scaly, Maceration, Atrophie Blanche, Cyanosis, Ecchymosis, Hemosiderin Staining, Mottled, Pallor, Rubor, Erythema. Periwound temperature was noted as No Abnormality. Wound #14 status is Open. Original cause of wound was Gradually Appeared. The wound is located on the Right,Plantar,Circumferential Foot. The wound measures 23cm length x 20.5cm width x 0.1cm depth; 370.315cm^2 area and 37.032cm^3 volume. The wound is limited to skin breakdown. There is no tunneling noted. There is a medium amount of serous drainage noted. The wound margin is indistinct and nonvisible. There is no granulation within the wound bed. There is a large (67-100%) amount of necrotic tissue within the wound bed including Eschar and Adherent Slough. The periwound skin appearance exhibited: Localized Edema, Dry/Scaly, Moist. The periwound skin appearance did not exhibit: Callus, Crepitus, Excoriation, Fluctuance, Friable, Induration, Rash, Scarring, Maceration, Atrophie Blanche, Cyanosis, Ecchymosis, Hemosiderin Staining, Mottled, Pallor, Rubor, Erythema. Periwound temperature was noted as No Abnormality. Wound #9 status is Open. Original cause of wound was Pressure Injury. The wound is located on the Left Calcaneous. The wound measures 3.5cm length x 8cm  width x 0.1cm depth; 21.991cm^2 area and 2.199cm^3 volume. The wound is limited to skin breakdown. There is no tunneling or undermining noted. There is a small amount of serous drainage noted. The wound margin is distinct with the outline attached to the wound base. There is no granulation within the wound bed. There is a large (67-100%) amount of necrotic tissue within the wound bed including Eschar and Adherent Slough. The periwound skin appearance exhibited: Moist. The periwound skin appearance did not exhibit: Callus, Crepitus, Excoriation, Fluctuance, Friable, Induration, Localized Edema, Rash, Scarring, Dry/Scaly, Maceration, Atrophie Blanche, Cyanosis, Ecchymosis, Hemosiderin Staining, Mottled, Pallor, Rubor, Erythema. Periwound temperature Payne, [name] ([id number]) was noted as No Abnormality. The periwound has tenderness on palpation. Assessment Active Problems ICD-10 E10.621 - Type 1 diabetes mellitus with foot ulcer E10.52 - Type 1 diabetes mellitus with diabetic peripheral angiopathy with gangrene I70.245 - Atherosclerosis of native arteries of left leg with ulceration of other part of foot I70.261 - Atherosclerosis of native arteries of  extremities with gangrene, right leg Y63.785 - Pressure ulcer of left heel, stage 2 L02.611 - Cutaneous abscess of right foot Plan Wound Cleansing: Wound #10 Right,Circumferential Calcaneous: Cleanse wound with mild soap and water Wound #12 Left,Plantar,Circumferential Foot: Cleanse wound with mild soap and water Wound #14 Right,Plantar,Circumferential Foot: Cleanse wound with mild soap and water Wound #9 Left Calcaneous: Cleanse wound with mild soap and water Primary Wound Dressing: Wound #10 Right,Circumferential Calcaneous: Other: - betadine paint Wound #12 Left,Plantar,Circumferential Foot: Other: - betadine paint Wound #14 Right,Plantar,Circumferential Foot: Other: - betadine paint Wound #9 Left Calcaneous: Other: -  betadine paint Secondary Dressing: Wound #10 Right,Circumferential Calcaneous: Gauze and Kerlix/Conform Wound #12 Left,Plantar,Circumferential Foot: Gauze and Kerlix/Conform Wound #14 Right,Plantar,Circumferential Foot: Gauze and Kerlix/Conform Wound #9 Left Calcaneous: [name], [name] ([id number]) Gauze and Kerlix/Conform Dressing Change Frequency: Wound #10 Right,Circumferential Calcaneous: Change dressing every day. Wound #12 Left,Plantar,Circumferential Foot: Change dressing every day. Wound #14 Right,Plantar,Circumferential Foot: Change dressing every day. Wound #9 Left Calcaneous: Change dressing every day. Follow-up Appointments: Wound #10 Right,Circumferential Calcaneous: Return Appointment in: - When released from the hospital Wound #12 Left,Plantar,Circumferential Foot: Return Appointment in: - When released from the hospital Wound #14 Right,Plantar,Circumferential Foot: Return Appointment in: - When released from the hospital Wound #9 Left Calcaneous: Return Appointment in: - When released from the hospital Off-Loading: Wound #10 Right,Circumferential Calcaneous: Open toe surgical shoe with peg assist. Wound #12 Left,Plantar,Circumferential Foot: Open toe surgical shoe with peg assist. Wound #14 Right,Plantar,Circumferential Foot: Open toe surgical shoe with peg assist. Wound #9 Left Calcaneous: Open toe surgical shoe with peg assist. Additional Orders / Instructions: Wound #10 Right,Circumferential Calcaneous: Other: - MD spoke with Dr. Pernell Dupre; vascular surgeon at [facility]. Patient will be direct admitted to the hospital tonight for surgical removal of abscess to the right plantar foot. Wound #12 Left,Plantar,Circumferential Foot: Other: - MD spoke with Dr. Pernell Dupre; vascular surgeon at [facility]. Patient will be direct admitted to the hospital tonight for surgical removal of abscess to the right plantar foot. Wound #14 Right,Plantar,Circumferential  Foot: Other: - MD spoke with Dr. Pernell Dupre; vascular surgeon at [facility]. Patient will be direct admitted to the hospital tonight for surgical removal of abscess to the right plantar foot. Wound #9 Left Calcaneous: Other: - MD spoke with Dr. Pernell Dupre; vascular surgeon at [facility]. Patient will be direct admitted to the hospital tonight for surgical removal of abscess to the right plantar foot. After examining the patient I was able to talk to her primary vascular surgeon Dr. Pernell Dupre at the [facility]. I told him about the necrotic area on the plantar aspect of right foot which is now wet gangrene and he agreed with me that he would admit her at [facility] under her care and synchronize further [name], [name] ([id number]) treatment. We have discussed her poor prognosis and he and I discussed the need for hospice care and for sitting down and talking to the patient and her mother and giving them a proper detail of the prognosis. I have given him all my contact information and he will call me back once she is discharged from the hospital for palliative care at this end. Electronic Signature(s) Signed: 10/01/2015 4:30:03 PM By: [name] MD, FACS Entered By: [name] on 10/01/2015 16:30:03 [name] ([id number]) -------------------------------------------------------------------------------- Loran Senters Date of Service: 10/01/2015 Patient Name: N. Patient Account Number: [id number] Medical Record [name], [name], [name], [id number] [name]: Number: Antlers Wu 24-Jun-1981 (34 y.o. 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 of other part of foot I70.261 Atherosclerosis of native arteries of extremities with gangrene, right leg L89.622 Pressure ulcer of left heel, stage 2 L02.611 Cutaneous abscess of right foot Facility Procedures CPT4 Code: [id number] Description: 73736 - WOUND CARE VISIT-LEV 5 EST PT Modifier: Quantity: 1 Physician Procedures CPT4: Description Modifier Quantity Code [id number] - WC PHYS LEVEL 4 - EST PT 1 ICD-10 Description Diagnosis 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

## 2015-10-02 NOTE — Progress Notes (Signed)
[name], Wu ([id number]) Visit Report for 10/01/2015 Arrival Information Details [name], Wu Date of Service: 10/01/2015 3:30 PM Patient Name: N. Patient Account Number: [id number] Medical Record [name], RN, [name], [id number] Treating RN: Number: [place] Wu Date of Birth/Sex: 1980-12-18 (35 y.o. Female) Other Clinician: Primary Care Physician: [name] [name] Referring Physician: [name] Physician/Extender: Tania Ade in Treatment: 4 Visit Information History Since Last Visit Added or deleted any medications: No Patient Arrived: Other Any new allergies or adverse reactions: No Arrival Time: 15:28 Had a fall or experienced change in Yes Accompanied By: mother activities of daily living that may affect Transfer Assistance: None risk of falls: Patient Identification Verified: Yes Signs or symptoms of abuse/neglect since last No Secondary Verification Process Yes visito Completed: Has Dressing in Place as Prescribed: Yes Patient Requires Transmission-Based No Pain Present Now: No Precautions: Patient Has Alerts: Yes Patient Alerts: ABI: bilateral Electronic Signature(s) Signed: 10/01/2015 5:08:02 PM By: Elpidio Eric [name], RN Entered By: Elpidio Eric on 10/01/2015 15:29:10 [name] ([id number]) -------------------------------------------------------------------------------- Clinic Level of Care Assessment Details [name] Date of Service: 10/01/2015 3:30 PM Patient Name: N. Patient Account Number: [id number] Medical Record [name], RN, [name], [id number] Treating RN: Number: Lane Wu Date of Birth/Sex: 01-29-1981 (35 y.o. Female) Other Clinician: Primary Care Physician: [name] [name] Referring Physician: [name] Physician/Extender: Tania Ade in Treatment: 4 Clinic Level of Care Assessment Items TOOL 4 Quantity Score []  - Use when only an EandM is performed on FOLLOW-UP visit 0 ASSESSMENTS - Nursing Assessment  / Reassessment X - Reassessment of Co-morbidities (includes updates in patient status) 1 10 X - Reassessment of Adherence to Treatment Plan 1 5 ASSESSMENTS - Wound and Skin Assessment / Reassessment []  - Simple Wound Assessment / Reassessment - one wound 0 X - Complex Wound Assessment / Reassessment - multiple wounds 4 5 []  - Dermatologic / Skin Assessment (not related to wound area) 0 ASSESSMENTS - Focused Assessment []  - Circumferential Edema Measurements - multi extremities 0 []  - Nutritional Assessment / Counseling / Intervention 0 X - Lower Extremity Assessment (monofilament, tuning fork, pulses) 1 5 []  - Peripheral Arterial Disease Assessment (using hand held doppler) 0 ASSESSMENTS - Ostomy and/or Continence Assessment and Care []  - Incontinence Assessment and Management 0 []  - Ostomy Care Assessment and Management (repouching, etc.) 0 PROCESS - Coordination of Care X - Simple Patient / Family Education for ongoing care 1 15 []  - Complex (extensive) Patient / Family Education for ongoing care 0 []  - Staff obtains , Records, Test Results / Process Orders 0 []  - Staff telephones HHA, Nursing Homes / Clarify orders / etc 0 Willaims, Zenna N. ( ) []  - Routine Transfer to another Facility (non-emergent condition) 0 []  - Routine Hospital Admission (non-emergent condition) 0 []  - New Admissions / / Ordering NPWT, Apligraf, etc. 0 []  - Emergency Hospital Admission (emergent condition) 0 []  - Simple Discharge Coordination 0 []  - Complex (extensive) Discharge Coordination 0 PROCESS - Special Needs []  - Pediatric / Minor Patient Management 0 []  - Isolation Patient Management 0 []  - Hearing / Language / Visual special needs 0 []  - Assessment of Community assistance (transportation, D/C planning, etc.) 0 []  - Additional assistance / Altered mentation 0 []  - Support Surface(s) Assessment (bed, cushion, seat, etc.) 0 INTERVENTIONS - Wound Cleansing  / Measurement []  - Simple Wound Cleansing - one wound 0 X - Complex Wound Cleansing - multiple wounds 4 5 []  - Wound Imaging (photographs - any number of wounds)  0 []  - Wound Tracing (instead of photographs) 0 []  - Simple Wound Measurement - one wound 0 X - Complex Wound Measurement - multiple wounds 4 5 INTERVENTIONS - Wound Dressings []  - Small Wound Dressing one or multiple wounds 0 X - Medium Wound Dressing one or multiple wounds 4 15 []  - Large Wound Dressing one or multiple wounds 0 []  - Application of Medications - topical 0 []  - Application of Medications - injection 0 Lattanzio, [name] ([id number]) INTERVENTIONS - Miscellaneous []  - External ear exam 0 []  - Specimen Collection (cultures, biopsies, blood, body fluids, etc.) 0 []  - Specimen(s) / Culture(s) sent or taken to Lab for analysis 0 []  - Patient Transfer (multiple staff / Michiel Sites Lift / Similar devices) 0 []  - Simple Staple / Suture removal (25 or less) 0 []  - Complex Staple / Suture removal (26 or more) 0 []  - Hypo / Hyperglycemic Management (close monitor of Blood Glucose) 0 []  - Ankle / Brachial Index (ABI) - do not check if billed separately 0 X - Vital Signs 1 5 Has the patient been seen at the hospital within the last three years: Yes Total Score: 160 Level Of Care: New/Established - Level 5 Electronic Signature(s) Signed: 10/01/2015 5:08:02 PM By: Elpidio Eric [name], RN Entered By: Elpidio Eric on 10/01/2015 16:24:36 [name] ([id number]) -------------------------------------------------------------------------------- Encounter Discharge Information Details [name] Date of Service: 10/01/2015 3:30 PM Patient Name: N. Patient Account Number: [id number] Medical Record [name], RN, [name], [id number] Treating RN: Number: [name] Date of Birth/Sex: September 17, 1981 (34 y.o. Female) Other Clinician: Primary Care Physician: [name] [name] Referring Physician: [name] Physician/Extender: Tania Ade in Treatment: 4 Encounter Discharge Information Items Discharge Pain Level: 0 Discharge Condition: Stable Ambulatory Status: Wheelchair Discharge Destination: Home Private Transportation: Auto Accompanied By: mother Schedule Follow-up Appointment: No Medication Reconciliation completed and No provided to Patient/Care Luvena Wentling: Clinical Summary of Care: Electronic Signature(s) Signed: 10/01/2015 5:08:02 PM By: Elpidio Eric [name], RN Entered By: Elpidio Eric on 10/01/2015 16:26:52 [name] ([id number]) -------------------------------------------------------------------------------- Lower Extremity Assessment Details [name] Date of Service: 10/01/2015 3:30 PM Patient Name: N. Patient Account Number: [id number] Medical Record [name], RN, [name], [id number] Treating RN: Number: [place] Wu Date of Birth/Sex: Mar 11, 1981 (34 y.o. Female) Other Clinician: Primary Care Physician: [name] [name] Referring Physician: [name] Physician/Extender: Tania Ade in Treatment: 4 Vascular Assessment Pulses: Posterior Tibial Dorsalis Pedis Palpable: [Left:No] [Right:No] Doppler: [Left:Monophasic] [Right:Monophasic] Extremity colors, hair growth, and conditions: Extremity Color: [Left:Normal] [Right:Normal] Hair Growth on Extremity: [Left:No] [Right:No] Temperature of Extremity: [Right:Warm] Electronic Signature(s) Signed: 10/01/2015 5:08:02 PM By: Elpidio Eric [name], RN Entered By: Elpidio Eric on 10/01/2015 15:35:53 [name] ([id number]) -------------------------------------------------------------------------------- Multi Wound Chart Details [name] Date of Service: 10/01/2015 3:30 PM Patient Name: N. Patient Account Number: [id number] Medical Record [name], RN, [name], [id number] Treating RN: Number:  Wu Date of Birth/Sex: June 23, 1981 (34 y.o. Female) Other Clinician: Primary Care Physician: [name]  [name] Referring Physician: [name] Physician/Extender: Tania Ade in Treatment: 4 Vital Signs Height(in): Pulse(bpm): 92 Weight(lbs): Blood Pressure 92/59 (mmHg): Body Mass Index(BMI): Temperature(F): 97.5 Respiratory Rate 18 (breaths/min): Photos: [10:No Photos] [12:No Photos] [14:No Photos] Wound Location: [10:Right Calcaneous - Circumfernential] [12:Left Foot - Plantar, Circumfernential] [14:Right Foot - Plantar, Circumfernential] Wounding Event: [10:Pressure Injury] [12:Gradually Appeared] [14:Gradually Appeared] 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] [12:the Lower Extremity] [14:the Lower Extremity] Comorbid History: [10:Anemia, Type I Diabetes, Anemia, Type I Diabetes, Anemia, Type I  Diabetes, End Stage Renal Disease, End Stage Renal Disease, End Stage Renal Disease, Rheumatoid Arthritis, Neuropathy] [12:Rheumatoid Arthritis, Neuropathy]  [14:Rheumatoid Arthritis, Neuropathy] Date Acquired: [10:07/28/2015] [12:07/28/2015] [14:07/28/2015] Weeks of Treatment: [10:4] [12:4] [14:4] Wound Status: [10:Open] [12:Open] [14:Open] Clustered Wound: [10:No] [12:No] [14:Yes] Pending Amputation on Yes [12:Yes] [14:Yes] Presentation: Measurements L x W x D 9x9x0.1 [12:11x14x0.2] [14:23x20.5x0.1] (cm) Area (cm) : [10:63.617] [12:120.951] [14:370.315] Volume (cm) : [10:6.362] [12:24.19] [14:37.032] % Reduction in Area: [10:-89.50%] [12:-180.00%] [14:-573.60%] % Reduction in Volume: -89.50% [12:-460.00%] [14:-573.60%] Classification: [10:Grade 3] [12:Grade 2] [14:Grade 3] Wagner Verification: [10:Abscess] [12:N/A] [14:Abscess] Exudate Amount: [10:None Present] [12:Large] [14:Medium] Exudate Type: [10:N/A] [12:Serous] [14:Serous] Exudate Color: [10:N/A No] [12:amber Yes] [14:amber Yes] Foul Odor After Cleansing: Odor Anticipated Due to N/A No No Product Use: Wound Margin: Distinct,  outline attached Indistinct, nonvisible Indistinct, nonvisible Granulation Amount: None Present (0%) None Present (0%) None Present (0%) Necrotic Amount: Large (67-100%) Large (67-100%) Large (67-100%) Necrotic Tissue: Eschar Eschar, Adherent Slough Eschar, Adherent Slough Exposed Structures: Fascia: No Fascia: No Fascia: No Fat: No Fat: No Fat: No Tendon: No Tendon: No Tendon: No Muscle: No Muscle: No Muscle: No Joint: No Joint: No Joint: No Bone: No Bone: No Bone: No Limited to Skin Limited to Skin Limited to Skin Breakdown Breakdown Breakdown Epithelialization: None None None Periwound Skin Texture: Edema: Yes Edema: Yes Edema: Yes Excoriation: No Excoriation: No Excoriation: No Induration: No Induration: No Induration: No Callus: No Callus: No Callus: No Crepitus: No Crepitus: No Crepitus: No Fluctuance: No Fluctuance: No Fluctuance: No Friable: No Friable: No Friable: No Rash: No Rash: No Rash: No Scarring: No Scarring: No Scarring: No Periwound Skin Dry/Scaly: Yes Moist: Yes Moist: Yes Moisture: Maceration: No Maceration: No Dry/Scaly: Yes Moist: No Dry/Scaly: No Maceration: No Periwound Skin Color: Atrophie Blanche: No Atrophie Blanche: No Atrophie Blanche: No Cyanosis: No Cyanosis: No Cyanosis: No Ecchymosis: No Ecchymosis: No Ecchymosis: No Erythema: No Erythema: No Erythema: No Hemosiderin Staining: No Hemosiderin Staining: No Hemosiderin Staining: No Mottled: No Mottled: No Mottled: No Pallor: No Pallor: No Pallor: No Rubor: No Rubor: No Rubor: No Temperature: No Abnormality No Abnormality No Abnormality Tenderness on Yes No No Palpation: Wound Preparation: Ulcer Cleansing: Ulcer Cleansing: Ulcer Cleansing: Rinsed/Irrigated with Rinsed/Irrigated with Rinsed/Irrigated with Saline Saline Saline Topical Anesthetic Topical Anesthetic Topical Anesthetic Applied: None Applied: None Applied: None Wound Number: 9 N/A  N/A Photos: No Photos N/A N/A Wound Location: Left Calcaneous N/A N/A ARRIA, POLSKY ([id number]) Wounding Event: Pressure Injury N/A N/A Primary Etiology: 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 Comorbid History: Anemia, Type I Diabetes, N/A N/A End Stage Renal Disease, Rheumatoid Arthritis, Neuropathy Date Acquired: 07/28/2015 N/A N/A Weeks of Treatment: 4 N/A N/A Wound Status: Open N/A N/A Clustered Wound: No N/A N/A Pending Amputation on No N/A N/A Presentation: Measurements L x W x D 3.5x8x0.1 N/A N/A (cm) Area (cm) : 21.991 N/A N/A Volume (cm) : 2.199 N/A N/A % Reduction in Area: -18.40% N/A N/A % Reduction in Volume: -18.40% N/A N/A Classification: Grade 2 N/A N/A [address] Verification: N/A N/A N/A Exudate Amount: Small N/A N/A Exudate Type: Serous N/A N/A Exudate Color: amber N/A N/A Foul Odor After Yes N/A N/A Cleansing: Odor Anticipated Due to No N/A N/A Product Use: Wound Margin: Distinct, outline attached N/A N/A Granulation Amount: None Present (0%) N/A N/A Necrotic Amount: Large (67-100%) N/A N/A Necrotic Tissue: Eschar, Adherent Slough N/A N/A Exposed Structures: Fascia: No N/A N/A Fat: No Tendon: No Muscle: No Joint: No Bone: No  Limited to Skin Breakdown Epithelialization: None N/A N/A Periwound Skin Texture: Edema: No N/A N/A Excoriation: No Induration: No Callus: No Crepitus: No Fluctuance: No Friable: No Nadeau, Yarely N. ([id number]) Rash: No Scarring: No Periwound Skin Moist: Yes N/A N/A Moisture: Maceration: No Dry/Scaly: No Periwound Skin Color: Atrophie Blanche: No N/A N/A Cyanosis: No Ecchymosis: No Erythema: No Hemosiderin Staining: No Mottled: No Pallor: No Rubor: No Temperature: No Abnormality N/A N/A Tenderness on Yes N/A N/A Palpation: Wound Preparation: Ulcer Cleansing: N/A N/A Rinsed/Irrigated with Saline Topical Anesthetic Applied: Other: lidocaine 4% Treatment Notes Electronic  Signature(s) Signed: 10/01/2015 5:08:02 PM By: Elpidio Eric [name], RN Entered By: Elpidio Eric on 10/01/2015 16:19:39 [name] ([id number]) -------------------------------------------------------------------------------- Multi-Disciplinary Care Plan Details [name] Date of Service: 10/01/2015 3:30 PM Patient Name: N. Patient Account Number: [id number] Medical Record [name], RN, [name], [id number] Treating RN: Number: [place] Wu Date of Birth/Sex: December 16, 1980 (34 y.o. Female) Other Clinician: Primary Care Physician: [name] [name] Referring Physician: [name] Physician/Extender: Tania Ade in Treatment: 4 Active Inactive HBO Nursing Diagnoses: Anxiety related to feelings of confinement associated with the hyperbaric oxygen chamber Anxiety related to knowledge deficit of hyperbaric oxygen therapy and treatment procedures Discomfort related to temperature and humidity changes inside hyperbaric chamber Potential for barotraumas to ears, sinuses, teeth, and lungs or cerebral gas embolism related to changes in atmospheric pressure inside hyperbaric oxygen chamber Potential for oxygen toxicity seizures related to delivery of 100% oxygen at an increased atmospheric pressure Potential for pulmonary oxygen toxicity related to delivery of 100% oxygen at an increased atmospheric pressure Goals: Barotrauma will be prevented during HBO2 Date Initiated: 08/30/2015 Goal Status: Active Patient and/or family will be able to state/discuss factors appropriate to the management of their disease process during treatment Date Initiated: 08/30/2015 Goal Status: Active Patient will tolerate the hyperbaric oxygen therapy treatment Date Initiated: 08/30/2015 Goal Status: Active Patient will tolerate the internal climate of the chamber Date Initiated: 08/30/2015 Goal Status: Active Patient/caregiver will verbalize understanding of HBO goals, rationale, procedures and potential  hazards Date Initiated: 08/30/2015 Goal Status: Active Signs and symptoms of pulmonary oxygen toxicity will be recognized and promptly addressed Date Initiated: 08/30/2015 Goal Status: Active OKSANA, DEBERRY ([id number]) Signs and symptoms of seizure will be recognized and promptly addressed ; seizing patients will suffer no harm Date Initiated: 08/30/2015 Goal Status: Active Interventions: Administer a five (5) minute air break for patient if signs and symptoms of seizure appear and notify the hyperbaric physician Administer a ten (10) minute air break for patient if signs and symptoms of seizure appear and notify the hyperbaric physician Administer decongestants, per physician orders, prior to HBO2 Administer the correct therapeutic gas delivery based on the patients needs and limitations, per physician order Assess and provide for patientos comfort related to the hyperbaric environment and equalization of middle ear Assess for signs and symptoms related to adverse events, including but not limited to confinement anxiety, pneumothorax, oxygen toxicity and baurotrauma Assess patient for any history of confinement anxiety Assess patient's knowledge and expectations regarding hyperbaric medicine and provide education related to the hyperbaric environment, goals of treatment and prevention of adverse events Implement protocols to decrease risk of pneumothorax in high risk patients Notes: Abuse / Safety / Falls / Self Care Management Nursing Diagnoses: Potential for falls Goals: Patient will remain injury free Date Initiated: 09/19/2015 Goal Status: Active Patient/caregiver will verbalize/demonstrate measures taken to prevent injury and/or falls Date Initiated: 09/19/2015 Goal Status: Active Interventions: Assess fall risk on admission and  as needed Assess impairment of mobility on admission and as needed per policy Notes: Orientation to the Wound Care Program Nursing  Diagnoses: Knowledge deficit related to the wound healing center program LUCCIANA, HEAD ([id number]) Goals: Patient/caregiver will verbalize understanding of the Wound Healing Center Program Date Initiated: 08/30/2015 Goal Status: Active Interventions: Provide education on orientation to the wound center Notes: Pressure Nursing Diagnoses: Knowledge deficit related to causes and risk factors for pressure ulcer development Knowledge deficit related to management of pressures ulcers Potential for impaired tissue integrity related to pressure, friction, moisture, and shear Goals: Patient will remain free from development of additional pressure ulcers Date Initiated: 08/30/2015 Goal Status: Active Patient will remain free of pressure ulcers Date Initiated: 08/30/2015 Goal Status: Active Patient/caregiver will verbalize risk factors for pressure ulcer development Date Initiated: 08/30/2015 Goal Status: Active Patient/caregiver will verbalize understanding of pressure ulcer management Date Initiated: 08/30/2015 Goal Status: Active Interventions: Assess: immobility, friction, shearing, incontinence upon admission and as needed Assess offloading mechanisms upon admission and as needed Assess potential for pressure ulcer upon admission and as needed Provide education on pressure ulcers Treatment Activities: Patient referred for home evaluation of offloading devices/mattresses : 10/01/2015 Patient referred for pressure reduction/relief devices : 10/01/2015 Pressure reduction/relief device ordered : 10/01/2015 Notes: Wound/Skin Impairment LINDSEY, DEMONTE ([id number]) Nursing Diagnoses: Impaired tissue integrity Knowledge deficit related to ulceration/compromised skin integrity Goals: Patient will have a decrease in wound volume by X% from date: (specify in notes) Date Initiated: 08/30/2015 Goal Status: Active Patient/caregiver will verbalize understanding of skin care regimen Date  Initiated: 08/30/2015 Goal Status: Active Ulcer/skin breakdown will have a volume reduction of 30% by week 4 Date Initiated: 08/30/2015 Goal Status: Active Ulcer/skin breakdown will have a volume reduction of 50% by week 8 Date Initiated: 08/30/2015 Goal Status: Active Ulcer/skin breakdown will have a volume reduction of 80% by week 12 Date Initiated: 08/30/2015 Goal Status: Active Ulcer/skin breakdown will heal within 14 weeks Date Initiated: 08/30/2015 Goal Status: Active Interventions: Assess patient/caregiver ability to obtain necessary supplies Assess patient/caregiver ability to perform ulcer/skin care regimen upon admission and as needed Assess ulceration(s) every visit Provide education on ulcer and skin care Notes: Electronic Signature(s) Signed: 10/01/2015 5:08:02 PM By: Elpidio Eric [name], RN Entered By: Elpidio Eric on 10/01/2015 16:19:28 [name] ([id number]) -------------------------------------------------------------------------------- Pain Assessment Details [name] Date of Service: 10/01/2015 3:30 PM Patient Name: N. Patient Account Number: [id number] Medical Record [name], RN, [name], [id number] Treating RN: Number: [place] Wu Date of Birth/Sex: July 13, 1981 (34 y.o. Female) Other Clinician: Primary Care Physician: [name] [name] Referring Physician: [name] Physician/Extender: Tania Ade in Treatment: 4 Active Problems Location of Pain Severity and Description of Pain Patient Has Paino Yes Site Locations Rate the pain. Current Pain Level: 5 Pain Management and Medication Current Pain Management: Electronic Signature(s) Signed: 10/01/2015 5:08:02 PM By: Elpidio Eric [name], RN Entered By: Elpidio Eric on 10/01/2015 15:29:27 [name] ([id number]) -------------------------------------------------------------------------------- Patient/Caregiver Education Details [name] Date of Service: 10/01/2015 3:30 PM Patient  Name: N. Patient Account Number: [id number] Medical Record [name], RN, [name], [id number] Treating RN: Number: [place] Wu Date of Birth/Gender: 02-Aug-1981 (34 y.o. Female) Other Clinician: Primary Care Physician: [name] [name] Referring Physician: [name] Physician/Extender: Tania Ade in Treatment: 4 Education Assessment Education Provided To: Patient Education Topics Provided Pressure: Methods: Explain/Verbal Responses: State content correctly Welcome To The Wound Care Center: Methods: Explain/Verbal Responses: State content correctly Wound/Skin Impairment: Methods: Explain/Verbal Responses: State content correctly Electronic Signature(s) Signed: 10/01/2015 5:08:02 PM  By: Elpidio Eric [name], RN Entered By: Elpidio Eric on 10/01/2015 16:27:13 [name] ([id number]) -------------------------------------------------------------------------------- Wound Assessment Details [name] Date of Service: 10/01/2015 3:30 PM Patient Name: N. Patient Account Number: [id number] Medical Record [name], RN, [name], [id number] Treating RN: Number: [place] Wu Date of Birth/Sex: [birth year] (34 y.o. Female) Other Clinician: Primary Care Physician: [name] [name] Referring Physician: [name] Physician/Extender: Tania Ade in Treatment: 4 Wound Status Wound Number: 10 Primary Diabetic Wound/Ulcer of the Lower Etiology: Extremity Wound Location: Right Calcaneous - Circumfernential Wound Open Status: Wounding Event: Pressure Injury Comorbid Anemia, Type I Diabetes, End Stage Date Acquired: 07/28/2015 History: Renal Disease, Rheumatoid Arthritis, Weeks Of Treatment: 4 Neuropathy Clustered Wound: No Pending Amputation On Presentation Photos Photo Uploaded By: Elpidio Eric on 10/01/2015 16:45:46 Wound Measurements Length: (cm) 9 Width: (cm) 9 Depth: (cm) 0.1 Area: (cm) 63.617 Volume: (cm) 6.362 % Reduction in Area: -89.5% % Reduction in  Volume: -89.5% Epithelialization: None Tunneling: No Undermining: No Wound Description Classification: Grade 3 Foul Odor Aft Wagner Verification: Abscess Wound Margin: Distinct, outline attached Exudate Amount: None Present er Cleansing: No Wound Bed Granulation Amount: None Present (0%) Exposed Structure Necrotic Amount: Large (67-100%) Fascia Exposed: No Butler, Arlo N. ([id number]) Necrotic Quality: Eschar Fat Layer Exposed: No Tendon Exposed: No Muscle Exposed: No Joint Exposed: No Bone Exposed: No Limited to Skin Breakdown Periwound Skin Texture Texture Color No Abnormalities Noted: No No Abnormalities Noted: No Callus: No Atrophie Blanche: No Crepitus: No Cyanosis: No Excoriation: No Ecchymosis: No Fluctuance: No Erythema: No Friable: No Hemosiderin Staining: No Induration: No Mottled: No Localized Edema: Yes Pallor: No Rash: No Rubor: No Scarring: No Temperature / Pain Moisture Temperature: No Abnormality No Abnormalities Noted: No Tenderness on Palpation: Yes Dry / Scaly: Yes Maceration: No Moist: No Wound Preparation Ulcer Cleansing: Rinsed/Irrigated with Saline Topical Anesthetic Applied: None Treatment Notes Wound #10 (Right, Circumferential Calcaneous) 1. Cleansed with: Cleanse wound with antibacterial soap and water 4. Dressing Applied: Other dressing (specify in notes) 5. Secondary Dressing Applied Gauze and Kerlix/Conform 7. Secured with Tape Other (specify in notes) Notes betadine paint stockinette fish net Electronic Signature(s) Signed: 10/01/2015 5:08:02 PM By: Elpidio Eric [name], RN [place], Ledell Peoples ([id number]) Entered By: Elpidio Eric on 10/01/2015 15:44:58 [name] ([id number]) -------------------------------------------------------------------------------- Wound Assessment Details [name] Date of Service: 10/01/2015 3:30 PM Patient Name: N. Patient Account Number: [id number] Medical Record  [name], RN, [name], [id number] Treating RN: Number: [place] Wu Date of Birth/Sex: Jan 30, 1981 (34 y.o. Female) Other Clinician: Primary Care Physician: [name] [name] Referring Physician: [name] Physician/Extender: Tania Ade in Treatment: 4 Wound Status Wound Number: 12 Primary Diabetic Wound/Ulcer of the Lower Etiology: Extremity Wound Location: Left Foot - Plantar, Circumfernential Wound Open Status: Wounding Event: Gradually Appeared Comorbid Anemia, Type I Diabetes, End Stage Date Acquired: 07/28/2015 History: Renal Disease, Rheumatoid Arthritis, Weeks Of Treatment: 4 Neuropathy Clustered Wound: No Pending Amputation On Presentation Photos Photo Uploaded By: Elpidio Eric on 10/01/2015 16:45:47 Wound Measurements Length: (cm) 11 Width: (cm) 14 Depth: (cm) 0.2 Area: (cm) 120.951 Volume: (cm) 24.19 % Reduction in Area: -180% % Reduction in Volume: -460% Epithelialization: None Tunneling: No Undermining: No Wound Description Classification: Grade 2 Foul Odor Aft Wound Margin: Indistinct, nonvisible Due to Produc Exudate Amount: Large Exudate Type: Serous Exudate Color: amber er Cleansing: Yes t Use: No Wound Bed Granulation Amount: None Present (0%) Exposed Structure Stephani, Deshondra N. ([id number]) Necrotic Amount: Large (67-100%) Fascia Exposed: No Necrotic Quality: Eschar, Adherent Slough Fat Layer Exposed: No  Tendon Exposed: No Muscle Exposed: No Joint Exposed: No Bone Exposed: No Limited to Skin Breakdown Periwound Skin Texture Texture Color No Abnormalities Noted: No No Abnormalities Noted: No Callus: No Atrophie Blanche: No Crepitus: No Cyanosis: No Excoriation: No Ecchymosis: No Fluctuance: No Erythema: No Friable: No Hemosiderin Staining: No Induration: No Mottled: No Localized Edema: Yes Pallor: No Rash: No Rubor: No Scarring: No Temperature / Pain Moisture Temperature: No Abnormality No Abnormalities Noted:  No Dry / Scaly: No Maceration: No Moist: Yes Wound Preparation Ulcer Cleansing: Rinsed/Irrigated with Saline Topical Anesthetic Applied: None Treatment Notes Wound #12 (Left, Plantar, Circumferential Foot) 1. Cleansed with: Cleanse wound with antibacterial soap and water 4. Dressing Applied: Other dressing (specify in notes) 5. Secondary Dressing Applied Gauze and Kerlix/Conform 7. Secured with Tape Other (specify in notes) Notes betadine paint stockinette fish net Electronic Signature(s) LAVAYA, DEFREITAS ([id number]) Signed: 10/01/2015 5:08:02 PM By: Elpidio Eric [name], RN Entered By: Elpidio Eric on 10/01/2015 15:45:19 [name] ([id number]) -------------------------------------------------------------------------------- Wound Assessment Details [name] Date of Service: 10/01/2015 3:30 PM Patient Name: N. Patient Account Number: [id number] Medical Record [name], RN, [name], [id number] Treating RN: Number: [place] Wu Date of Birth/Sex: 26-Mar-1981 (34 y.o. Female) Other Clinician: Primary Care Physician: [name] [name] Referring Physician: [name] Physician/Extender: Tania Ade in Treatment: 4 Wound Status Wound Number: 14 Primary Diabetic Wound/Ulcer of the Lower Etiology: Extremity Wound Location: Right Foot - Plantar, Circumfernential Wound Open Status: Wounding Event: Gradually Appeared Comorbid Anemia, Type I Diabetes, End Stage Date Acquired: 07/28/2015 History: Renal Disease, Rheumatoid Arthritis, Weeks Of Treatment: 4 Neuropathy Clustered Wound: Yes Pending Amputation On Presentation Photos Photo Uploaded By: Elpidio Eric on 10/01/2015 16:46:27 Wound Measurements Length: (cm) 23 Width: (cm) 20.5 Depth: (cm) 0.1 Area: (cm) 370.315 Volume: (cm) 37.032 % Reduction in Area: -573.6% % Reduction in Volume: -573.6% Epithelialization: None Tunneling: No Wound Description Classification: Grade 3 Foul Odor Aft Wagner  Verification: Abscess Due to Produc Wound Margin: Indistinct, nonvisible Exudate Amount: Medium Exudate Type: Serous Exudate Color: amber er Cleansing: Yes t Use: No Wound Bed Gol[place], Joyanna N. ([id number]) Granulation Amount: None Present (0%) Exposed Structure Necrotic Amount: Large (67-100%) Fascia Exposed: No Necrotic Quality: Eschar, Adherent Slough Fat Layer Exposed: No Tendon Exposed: No Muscle Exposed: No Joint Exposed: No Bone Exposed: No Limited to Skin Breakdown Periwound Skin Texture Texture Color No Abnormalities Noted: No No Abnormalities Noted: No Callus: No Atrophie Blanche: No Crepitus: No Cyanosis: No Excoriation: No Ecchymosis: No Fluctuance: No Erythema: No Friable: No Hemosiderin Staining: No Induration: No Mottled: No Localized Edema: Yes Pallor: No Rash: No Rubor: No Scarring: No Temperature / Pain Moisture Temperature: No Abnormality No Abnormalities Noted: No Dry / Scaly: Yes Maceration: No Moist: Yes Wound Preparation Ulcer Cleansing: Rinsed/Irrigated with Saline Topical Anesthetic Applied: None Treatment Notes Wound #14 (Right, Plantar, Circumferential Foot) 1. Cleansed with: Cleanse wound with antibacterial soap and water 4. Dressing Applied: Other dressing (specify in notes) 5. Secondary Dressing Applied Gauze and Kerlix/Conform 7. Secured with Tape Other (specify in notes) Notes betadine paint stockinette fish net OPEL, LEJEUNE ([id number]) Electronic Signature(s) Signed: 10/01/2015 5:08:02 PM By: Elpidio Eric [name], RN Entered By: Elpidio Eric on 10/01/2015 15:45:41 [name] ([id number]) -------------------------------------------------------------------------------- Wound Assessment Details [name] Date of Service: 10/01/2015 3:30 PM Patient Name: N. Patient Account Number: [id number] Medical Record [name], RN, [name], [id number] Treating RN: Number: [place] Wu Date of Birth/Sex: 1981/02/28 (34  y.o. Female) Other Clinician: Primary Care Physician: [name] [name] Referring Physician:  [name] Physician/Extender: Weeks in Treatment: 4 Wound Status Wound Number: 9 Primary Diabetic Wound/Ulcer of the Lower Etiology: Extremity Wound Location: Left Calcaneous Wound Open Wounding Event: Pressure Injury Status: Date Acquired: 07/28/2015 Comorbid Anemia, Type I Diabetes, End Stage Weeks Of Treatment: 4 History: Renal Disease, Rheumatoid Arthritis, Clustered Wound: No Neuropathy Photos Photo Uploaded By: Elpidio Eric on 10/01/2015 16:47:17 Wound Measurements Length: (cm) 3.5 Width: (cm) 8 Depth: (cm) 0.1 Area: (cm) 21.991 Volume: (cm) 2.199 % Reduction in Area: -18.4% % Reduction in Volume: -18.4% Epithelialization: None Tunneling: No Undermining: No Wound Description Classification: Grade 2 Foul Odor Aft Wound Margin: Distinct, outline attached Due to Produc Exudate Amount: Small Exudate Type: Serous Exudate Color: amber er Cleansing: Yes t Use: No Wound Bed Granulation Amount: None Present (0%) Exposed Structure Necrotic Amount: Large (67-100%) Fascia Exposed: No Krapf, Patricie N. ([id number]) Necrotic Quality: Eschar, Adherent Slough Fat Layer Exposed: No Tendon Exposed: No Muscle Exposed: No Joint Exposed: No Bone Exposed: No Limited to Skin Breakdown Periwound Skin Texture Texture Color No Abnormalities Noted: No No Abnormalities Noted: No Callus: No Atrophie Blanche: No Crepitus: No Cyanosis: No Excoriation: No Ecchymosis: No Fluctuance: No Erythema: No Friable: No Hemosiderin Staining: No Induration: No Mottled: No Localized Edema: No Pallor: No Rash: No Rubor: No Scarring: No Temperature / Pain Moisture Temperature: No Abnormality No Abnormalities Noted: No Tenderness on Palpation: Yes Dry / Scaly: No Maceration: No Moist: Yes Wound Preparation Ulcer Cleansing: Rinsed/Irrigated with  Saline Topical Anesthetic Applied: Other: lidocaine 4%, Treatment Notes Wound #9 (Left Calcaneous) 1. Cleansed with: Cleanse wound with antibacterial soap and water 4. Dressing Applied: Other dressing (specify in notes) 5. Secondary Dressing Applied Gauze and Kerlix/Conform 7. Secured with Tape Other (specify in notes) Notes betadine paint stockinette fish net Electronic Signature(s) Signed: 10/01/2015 5:08:02 PM By: Elpidio Eric [name], RN [place], Ledell Peoples ([id number]) Entered By: Elpidio Eric on 10/01/2015 15:45:55 [name] ([id number]) -------------------------------------------------------------------------------- Vitals Details [name] Date of Service: 10/01/2015 3:30 PM Patient Name: N. Patient Account Number: [id number] Medical Record [name], RN, [name], [id number] Treating RN: Number: [place] Wu Date of Birth/Sex: 13-Jul-1981 (34 y.o. Female) Other Clinician: Primary Care Physician: [name] [name] Referring Physician: [name] Physician/Extender: Tania Ade in Treatment: 4 Vital Signs Time Taken: 15:34 Temperature (F): 97.5 Pulse (bpm): 92 Respiratory Rate (breaths/min): 18 Blood Pressure (mmHg): 92/59 Reference Range: 80 - 120 mg / dl Electronic Signature(s) Signed: 10/01/2015 5:08:02 PM By: Elpidio Eric [name], RN Entered By: Elpidio Eric on 10/01/2015 15:34:52

## 2015-11-18 ENCOUNTER — Ambulatory Visit: Payer: Medicare Other | Admitting: Surgery

## 2015-11-21 ENCOUNTER — Ambulatory Visit: Payer: Medicare Other | Admitting: Surgery

## 2015-11-25 ENCOUNTER — Ambulatory Visit: Payer: Medicare Other | Admitting: Surgery

## 2015-11-26 ENCOUNTER — Ambulatory Visit: Payer: Medicare Other | Admitting: Internal Medicine

## 2015-11-26 NOTE — Progress Notes (Signed)
DEMETRIA, IWAI ([id number]) Visit Report for 11/25/2015 Complex / Palliative Patient Assessment Details ZYARA, RILING 11/25/2015 2:45 Patient Name: Date of Service: N. PM Medical Record Patient Account Number: [id number] Number: Treating RN: Huel Coventry Date of Birth/Sex: 01/08/1981 (35 y.o. Female) Other Clinician: Primary Care Physician: [name] Treating [name] Referring Physician: [name] Physician/Extender: Tania Ade in Treatment: 12 Palliative Management Criteria Complex Wound Management Criteria Patient has remarkable or complex co-morbidities requiring medications or treatments that extend wound healing times. Examples: o Diabetes mellitus with chronic renal failure or end stage renal disease requiring dialysis o Advanced or poorly controlled rheumatoid arthritis o Diabetes mellitus and end stage chronic obstructive pulmonary disease o Active cancer with current chemo- or radiation therapy Care Approach Wound Care Plan: Complex Wound Management Electronic Signature(s) Signed: 11/25/2015 10:23:54 AM By: Elliot Gurney RN, [name], Kim RN, [name] Signed: 11/25/2015 4:45:18 PM By: [name] MD, FACS Entered By: Elliot Gurney, RN, [name], Kim on 11/25/2015 10:23:54

## 2015-11-28 ENCOUNTER — Ambulatory Visit: Payer: Medicare Other | Admitting: Surgery

## 2015-11-29 ENCOUNTER — Encounter: Payer: Medicare Other | Attending: Surgery | Admitting: Surgery

## 2015-11-29 DIAGNOSIS — L89622 Pressure ulcer of left heel, stage 2: Secondary | ICD-10-CM | POA: Insufficient documentation

## 2015-11-29 DIAGNOSIS — E1022 Type 1 diabetes mellitus with diabetic chronic kidney disease: Secondary | ICD-10-CM | POA: Diagnosis not present

## 2015-11-29 DIAGNOSIS — I70245 Atherosclerosis of native arteries of left leg with ulceration of other part of foot: Secondary | ICD-10-CM | POA: Insufficient documentation

## 2015-11-29 DIAGNOSIS — N186 End stage renal disease: Secondary | ICD-10-CM | POA: Diagnosis not present

## 2015-11-29 DIAGNOSIS — E43 Unspecified severe protein-calorie malnutrition: Secondary | ICD-10-CM | POA: Insufficient documentation

## 2015-11-29 DIAGNOSIS — E10621 Type 1 diabetes mellitus with foot ulcer: Secondary | ICD-10-CM | POA: Insufficient documentation

## 2015-11-29 DIAGNOSIS — Z89511 Acquired absence of right leg below knee: Secondary | ICD-10-CM | POA: Diagnosis not present

## 2015-11-29 DIAGNOSIS — I70261 Atherosclerosis of native arteries of extremities with gangrene, right leg: Secondary | ICD-10-CM | POA: Diagnosis not present

## 2015-11-29 DIAGNOSIS — Z681 Body mass index (BMI) 19 or less, adult: Secondary | ICD-10-CM | POA: Diagnosis not present

## 2015-11-29 DIAGNOSIS — E1052 Type 1 diabetes mellitus with diabetic peripheral angiopathy with gangrene: Secondary | ICD-10-CM | POA: Insufficient documentation

## 2015-11-29 DIAGNOSIS — F419 Anxiety disorder, unspecified: Secondary | ICD-10-CM | POA: Diagnosis not present

## 2015-11-29 DIAGNOSIS — Z992 Dependence on renal dialysis: Secondary | ICD-10-CM | POA: Diagnosis not present

## 2015-11-29 DIAGNOSIS — L02611 Cutaneous abscess of right foot: Secondary | ICD-10-CM | POA: Diagnosis not present

## 2015-11-29 DIAGNOSIS — M069 Rheumatoid arthritis, unspecified: Secondary | ICD-10-CM | POA: Insufficient documentation

## 2015-11-30 NOTE — Progress Notes (Signed)
[name], [name] ([id number]) Visit Report for 11/29/2015 Arrival Information Details Patient Name: [name], [name] Date of Service: 11/29/2015 3:30 PM Medical Record Number: [id number] Patient Account Number: [id number] Date of Birth/Sex: 1981/08/04 (34 y.o. Female) Treating RN: [name] Primary Care Physician: [name] Other Clinician: Referring Physician: [name] Treating Physician/Extender: Rudene Re in Treatment: 13 Visit Information History Since Last Visit Added or deleted any medications: No Patient Arrived: Wheel Chair Any new allergies or adverse reactions: No Arrival Time: 15:16 Had a fall or experienced change in No activities of daily living that may affect Accompanied By: mom risk of falls: Transfer Assistance: Manual Signs or symptoms of abuse/neglect since last No Patient Identification Verified: Yes visito Secondary Verification Process Yes Hospitalized since last visit: No Completed: Pain Present Now: Yes Patient Requires Transmission-Based No Precautions: Patient Has Alerts: Yes Electronic Signature(s) Signed: 11/29/2015 5:40:45 PM By: [name] Entered By: [name] on 11/29/2015 15:18:05 [name] ([id number]) -------------------------------------------------------------------------------- Encounter Discharge Information Details Patient Name: [name] Date of Service: 11/29/2015 3:30 PM Medical Record Number: [id number] Patient Account Number: [id number] Date of Birth/Sex: 1981-04-17 (34 y.o. Female) Treating RN: [name] Primary Care Physician: [name] Other Clinician: Referring Physician: [name] Treating Physician/Extender: Rudene Re in Treatment: 42 Encounter Discharge Information Items Discharge Pain Level: 0 Discharge Condition: Stable Ambulatory Status: Wheelchair Discharge Destination: Home Transportation: Private Auto Accompanied By:  mom Schedule Follow-up Appointment: Yes Medication Reconciliation completed No and provided to Patient/Care [name]: Provided on Clinical Summary of Care: 11/29/2015 Form Type Recipient Paper Patient HB Electronic Signature(s) Signed: 11/29/2015 4:28:14 PM By: Gwenlyn Perking Previous Signature: 11/29/2015 4:24:12 PM Version By: [name] Entered By: Gwenlyn Perking on 11/29/2015 16:28:14 [name] ([id number]) -------------------------------------------------------------------------------- Lower Extremity Assessment Details Patient Name: [name] Date of Service: 11/29/2015 3:30 PM Medical Record Number: [id number] Patient Account Number: [id number] Date of Birth/Sex: 1981/06/10 (34 y.o. Female) Treating RN: [name] Primary Care Physician: [name] Other Clinician: Referring Physician: [name] Treating Physician/Extender: Rudene Re in Treatment: 13 Edema Assessment Assessed: [Left: No] Franne Forts: No] Edema: [Left: Ye] [Right: s] Vascular Assessment Pulses: Posterior Tibial Palpable: [Left:No] Doppler: [Left:Monophasic] Dorsalis Pedis Palpable: [Left:No] Doppler: [Left:Monophasic] Extremity colors, hair growth, and conditions: Extremity Color: [Left:Normal] Hair Growth on Extremity: [Left:Yes] Temperature of Extremity: [Left:Warm] Electronic Signature(s) Signed: 11/29/2015 5:40:45 PM By: [name] Entered By: [name] on 11/29/2015 15:27:01 [name] ([id number]) -------------------------------------------------------------------------------- Multi Wound Chart Details Patient Name: [name] Date of Service: 11/29/2015 3:30 PM Medical Record Number: [id number] Patient Account Number: [id number] Date of Birth/Sex: 1981-08-24 (34 y.o. Female) Treating RN: [name] Primary Care Physician: [name] Other Clinician: Referring Physician: [name] Treating Physician/Extender:  Rudene Re in Treatment: 13 Vital Signs Height(in): 68 Pulse(bpm): 83 Weight(lbs): 96 Blood Pressure 88/53 (mmHg): Body Mass Index(BMI): 15 Temperature(F): Respiratory Rate 16 (breaths/min): Photos: [10:No Photos] [12:No Photos] [14:No Photos] Wound Location: [10:Right, Circumferential Calcaneous] [12:Left Foot - Plantar, Circumfernential] [14:Right, Plantar, Circumferential Foot] Wounding Event: [10:Pressure Injury] [12:Gradually Appeared] [14:Gradually Appeared] 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] [12:Diabetic Wound/Ulcer of Diabetic Wound/Ulcer of the Lower Extremity] [14:the Lower Extremity] Comorbid History: [10:N/A] [12:Anemia, Type I Diabetes, N/A End Stage Renal Disease, Rheumatoid Arthritis, Neuropathy] Date Acquired: [10:07/28/2015] [12:07/28/2015] [14:07/28/2015] Weeks of Treatment: [10:13] [12:13] [14:13] Wound Status: [10:Amputation] [12:Open] [14:Amputation] Clustered Wound: [10:No] [12:No] [14:Yes] Pending Amputation on Yes [12:Yes] [14:Yes] Presentation: Measurements L x W x D N/A [12:11x9x0.2] [14:N/A] (cm) Area (cm) : [10:N/A] [  12:77.754] [14:N/A] Volume (cm) : [10:N/A] [12:15.551] [14:N/A] % Reduction in Area: [10:N/A] [12:-80.00%] [14:N/A] % Reduction in Volume: N/A [12:-260.00%] [14:N/A] Classification: [10:Grade 3] [12:Grade 2] [14:Grade 3] Exudate Amount: [10:N/A] [12:Large] [14:N/A] Exudate Type: [10:N/A] [12:Serous] [14:N/A] Exudate Color: [10:N/A] [12:amber] [14:N/A] Foul Odor After [10:N/A] [12:Yes] [14:N/A] Cleansing: Odor Anticipated Due to N/A [12:No] [14:N/A] Product Use: [name], [name]. ([id number]) Wound Margin: N/A Indistinct, nonvisible N/A Granulation Amount: N/A Small (1-33%) N/A Granulation Quality: N/A Red N/A Necrotic Amount: N/A Large (67-100%) N/A Necrotic Tissue: N/A Eschar, Adherent Slough N/A Epithelialization: N/A None N/A Debridement: N/A Debridement (24825-  N/A 11047) Time-Out Taken: N/A Yes N/A Pain Control: N/A Lidocaine 4% Topical N/A Solution Tissue Debrided: N/A Necrotic/Eschar, N/A Fibrin/Slough Level: N/A Skin/Subcutaneous N/A Tissue Debridement Area (sq N/A 99 N/A cm): Instrument: N/A Forceps, Scissors N/A Bleeding: N/A Minimum N/A Hemostasis Achieved: N/A Pressure N/A Procedural Pain: N/A 0 N/A Post Procedural Pain: N/A 0 N/A Debridement Treatment N/A Procedure was tolerated N/A Response: well Post Debridement N/A 11x9x0.2 N/A Measurements L x W x D (cm) Post Debridement N/A 15.551 N/A Volume: (cm) Periwound Skin Texture: No Abnormalities Noted Edema: Yes No Abnormalities Noted Excoriation: No Induration: No Callus: No Crepitus: No Fluctuance: No Friable: No Rash: No Scarring: No Periwound Skin No Abnormalities Noted Moist: Yes No Abnormalities Noted Moisture: Maceration: No Dry/Scaly: No Periwound Skin Color: No Abnormalities Noted Atrophie Blanche: No No Abnormalities Noted Cyanosis: No Ecchymosis: No Erythema: No Hemosiderin Staining: No Mottled: No Pallor: No Rubor: No Temperature: N/A No Abnormality N/A [name], [name] ([id number]) Tenderness on No No No Palpation: Wound Preparation: N/A Ulcer Cleansing: N/A Rinsed/Irrigated with Saline Topical Anesthetic Applied: None Procedures Performed: N/A Debridement N/A Wound Number: 9 N/A N/A Photos: No Photos N/A N/A Wound Location: Left Calcaneous N/A N/A Wounding Event: Pressure Injury N/A N/A Primary Etiology: 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 Comorbid History: Anemia, Type I Diabetes, N/A N/A End Stage Renal Disease, Rheumatoid Arthritis, Neuropathy Date Acquired: 07/28/2015 N/A N/A Weeks of Treatment: 13 N/A N/A Wound Status: Open N/A N/A Clustered Wound: No N/A N/A Pending Amputation on No N/A N/A Presentation: Measurements L x W x D 4x11.5x0.1 N/A N/A (cm) Area (cm) : 36.128 N/A N/A Volume (cm) : 3.613  N/A N/A % Reduction in Area: -94.50% N/A N/A % Reduction in Volume: -94.60% N/A N/A Classification: Grade 2 N/A N/A Exudate Amount: Small N/A N/A Exudate Type: Serous N/A N/A Exudate Color: amber N/A N/A Foul Odor After Yes N/A N/A Cleansing: Odor Anticipated Due to No N/A N/A Product Use: Wound Margin: Distinct, outline attached N/A N/A Granulation Amount: None Present (0%) N/A N/A Granulation Quality: N/A N/A N/A Necrotic Amount: Large (67-100%) N/A N/A Necrotic Tissue: Eschar, Adherent Slough N/A N/A Exposed Structures: Fascia: No N/A N/A Fat: No Tendon: No Muscle: No [name], [name] ([id number]) Joint: No Bone: No Limited to Skin Breakdown Epithelialization: None N/A N/A Debridement: Debridement (88280- N/A N/A 11047) Time-Out Taken: Yes N/A N/A Pain Control: Lidocaine 4% Topical N/A N/A Solution Tissue Debrided: Necrotic/Eschar, N/A N/A Fibrin/Slough Level: Skin/Subcutaneous N/A N/A Tissue Debridement Area (sq 46 N/A N/A cm): Instrument: Forceps, Scissors N/A N/A Bleeding: Minimum N/A N/A Hemostasis Achieved: Silver Nitrate N/A N/A Procedural Pain: 0 N/A N/A Post Procedural Pain: 0 N/A N/A Debridement Treatment Procedure was tolerated N/A N/A Response: well Post Debridement N/A N/A N/A Measurements L x W x D (cm) Post Debridement N/A N/A N/A Volume: (cm) Periwound Skin Texture: Edema: No N/A N/A Excoriation: No Induration:  No Callus: No Crepitus: No Fluctuance: No Friable: No Rash: No Scarring: No Periwound Skin Moist: Yes N/A N/A Moisture: Maceration: No Dry/Scaly: No Periwound Skin Color: Atrophie Blanche: No N/A N/A Cyanosis: No Ecchymosis: No Erythema: No Hemosiderin Staining: No Mottled: No Pallor: No Rubor: No Temperature: No Abnormality N/A N/A Yes N/A N/A [name], [name] ([id number]) Tenderness on Palpation: Wound Preparation: Ulcer Cleansing: N/A N/A Rinsed/Irrigated with Saline Topical Anesthetic Applied:  Other: lidocaine 4% Procedures Performed: Debridement N/A N/A Treatment Notes Electronic Signature(s) Signed: 11/29/2015 5:40:45 PM By: [name] Entered By: [name] on 11/29/2015 16:07:53 [name] ([id number]) -------------------------------------------------------------------------------- Multi-Disciplinary Care Plan Details Patient Name: [name] Date of Service: 11/29/2015 3:30 PM Medical Record Number: [id number] Patient Account Number: [id number] Date of Birth/Sex: [birth year] (34 y.o. Female) Treating RN: [name] Primary Care Physician: [name] Other Clinician: Referring Physician: [name] Treating Physician/Extender: Rudene Re in Treatment: 13 Active Inactive HBO Nursing Diagnoses: Anxiety related to feelings of confinement associated with the hyperbaric oxygen chamber Anxiety related to knowledge deficit of hyperbaric oxygen therapy and treatment procedures Discomfort related to temperature and humidity changes inside hyperbaric chamber Potential for barotraumas to ears, sinuses, teeth, and lungs or cerebral gas embolism related to changes in atmospheric pressure inside hyperbaric oxygen chamber Potential for oxygen toxicity seizures related to delivery of 100% oxygen at an increased atmospheric pressure Potential for pulmonary oxygen toxicity related to delivery of 100% oxygen at an increased atmospheric pressure Goals: Barotrauma will be prevented during HBO2 Date Initiated: 08/30/2015 Goal Status: Active Patient and/or family will be able to state/discuss factors appropriate to the management of their disease process during treatment Date Initiated: 08/30/2015 Goal Status: Active Patient will tolerate the hyperbaric oxygen therapy treatment Date Initiated: 08/30/2015 Goal Status: Active Patient will tolerate the internal climate of the chamber Date Initiated: 08/30/2015 Goal Status:  Active Patient/caregiver will verbalize understanding of HBO goals, rationale, procedures and potential hazards Date Initiated: 08/30/2015 Goal Status: Active Signs and symptoms of pulmonary oxygen toxicity will be recognized and promptly addressed Date Initiated: 08/30/2015 Goal Status: Active Signs and symptoms of seizure will be recognized and promptly addressed ; seizing patients will suffer no harm Date Initiated: 08/30/2015 [name] ([id number]) Goal Status: Active Interventions: Administer a five (5) minute air break for patient if signs and symptoms of seizure appear and notify the hyperbaric physician Administer a ten (10) minute air break for patient if signs and symptoms of seizure appear and notify the hyperbaric physician Administer decongestants, per physician orders, prior to HBO2 Administer the correct therapeutic gas delivery based on the patients needs and limitations, per physician order Assess and provide for patientos comfort related to the hyperbaric environment and equalization of middle ear Assess for signs and symptoms related to adverse events, including but not limited to confinement anxiety, pneumothorax, oxygen toxicity and baurotrauma Assess patient for any history of confinement anxiety Assess patient's knowledge and expectations regarding hyperbaric medicine and provide education related to the hyperbaric environment, goals of treatment and prevention of adverse events Implement protocols to decrease risk of pneumothorax in high risk patients Notes: Abuse / Safety / Falls / Self Care Management Nursing Diagnoses: Potential for falls Goals: Patient will remain injury free Date Initiated: 09/19/2015 Goal Status: Active Patient/caregiver will verbalize/demonstrate measures taken to prevent injury and/or falls Date Initiated: 09/19/2015 Goal Status: Active Interventions: Assess fall risk on admission and as needed Assess impairment of  mobility on admission and as needed per policy Notes: Orientation to  the Wound Care Program Nursing Diagnoses: Knowledge deficit related to the wound healing center program Goals: Patient/caregiver will verbalize understanding of the Wound Healing Center Program [place] ([id number]) Date Initiated: 08/30/2015 Goal Status: Active Interventions: Provide education on orientation to the wound center Notes: Pressure Nursing Diagnoses: Knowledge deficit related to causes and risk factors for pressure ulcer development Knowledge deficit related to management of pressures ulcers Potential for impaired tissue integrity related to pressure, friction, moisture, and shear Goals: Patient will remain free from development of additional pressure ulcers Date Initiated: 08/30/2015 Goal Status: Active Patient will remain free of pressure ulcers Date Initiated: 08/30/2015 Goal Status: Active Patient/caregiver will verbalize risk factors for pressure ulcer development Date Initiated: 08/30/2015 Goal Status: Active Patient/caregiver will verbalize understanding of pressure ulcer management Date Initiated: 08/30/2015 Goal Status: Active Interventions: Assess: immobility, friction, shearing, incontinence upon admission and as needed Assess offloading mechanisms upon admission and as needed Assess potential for pressure ulcer upon admission and as needed Provide education on pressure ulcers Treatment Activities: Patient referred for home evaluation of offloading devices/mattresses : 11/29/2015 Patient referred for pressure reduction/relief devices : 11/29/2015 Pressure reduction/relief device ordered : 11/29/2015 Notes: Wound/Skin Impairment Nursing Diagnoses: Impaired tissue integrity [name], [name] ([id number]) Knowledge deficit related to ulceration/compromised skin integrity Goals: Patient will have a decrease in wound volume by X% from date: (specify in notes) Date Initiated:  08/30/2015 Goal Status: Active Patient/caregiver will verbalize understanding of skin care regimen Date Initiated: 08/30/2015 Goal Status: Active Ulcer/skin breakdown will have a volume reduction of 30% by week 4 Date Initiated: 08/30/2015 Goal Status: Active Ulcer/skin breakdown will have a volume reduction of 50% by week 8 Date Initiated: 08/30/2015 Goal Status: Active Ulcer/skin breakdown will have a volume reduction of 80% by week 12 Date Initiated: 08/30/2015 Goal Status: Active Ulcer/skin breakdown will heal within 14 weeks Date Initiated: 08/30/2015 Goal Status: Active Interventions: Assess patient/caregiver ability to obtain necessary supplies Assess patient/caregiver ability to perform ulcer/skin care regimen upon admission and as needed Assess ulceration(s) every visit Provide education on ulcer and skin care Notes: Electronic Signature(s) Signed: 11/29/2015 5:40:45 PM By: [name] Entered By: [name] on 11/29/2015 16:05:58 [name] ([id number]) -------------------------------------------------------------------------------- Pain Assessment Details Patient Name: [name] Date of Service: 11/29/2015 3:30 PM Medical Record Number: [id number] Patient Account Number: [id number] Date of Birth/Sex: Aug 27, 1981 (34 y.o. Female) Treating RN: [name] Primary Care Physician: [name] Other Clinician: Referring Physician: [name] Treating Physician/Extender: Rudene Re in Treatment: 62 Active Problems Location of Pain Severity and Description of Pain Patient Has Paino Yes Site Locations Pain Location: Pain in Ulcers With Dressing Change: No Duration of the Pain. Constant / Intermittento Constant Pain Management and Medication Current Pain Management: Electronic Signature(s) Signed: 11/29/2015 5:40:45 PM By: [name] Entered By: [name] on 11/29/2015 15:18:25 [name]  ([id number]) -------------------------------------------------------------------------------- Patient/Caregiver Education Details Patient Name: [name] Date of Service: 11/29/2015 3:30 PM Medical Record Number: [id number] Patient Account Number: [id number] Date of Birth/Gender: 05-12-1981 (35 y.o. Female) Treating RN: [name] Primary Care Physician: [name] Other Clinician: Referring Physician: [name] Treating Physician/Extender: Rudene Re in Treatment: 13 Education Assessment Education Provided To: Patient and Caregiver Education Topics Provided Wound/Skin Impairment: Handouts: Other: wound care as ordered Methods: Demonstration, Explain/Verbal Responses: State content correctly Electronic Signature(s) Signed: 11/29/2015 4:24:32 PM By: [name] Entered By: [name] on 11/29/2015 16:24:32 [name] ([id number]) -------------------------------------------------------------------------------- Wound Assessment Details Patient Name: [name] Date of  Service: 11/29/2015 3:30 PM Medical Record Number: [id number] Patient Account Number: [id number] Date of Birth/Sex: [birth year] (34 y.o. Female) Treating RN: [name] Primary Care Physician: [name] Other Clinician: Referring Physician: [name] Treating Physician/Extender: Rudene Re in Treatment: 13 Wound Status Wound Number: 10 Primary Diabetic Wound/Ulcer of the Lower Etiology: Extremity Wound Location: Right, Circumferential Calcaneous Wound Status: Amputation Wounding Event: Pressure Injury Outcome Below Knee Level: Date Acquired: 07/28/2015 Weeks Of Treatment: 13 Clustered Wound: No Pending Amputation On Presentation Wound Description Classification: Grade 3 Periwound Skin Texture Texture Color No Abnormalities Noted: No No Abnormalities Noted: No Moisture No Abnormalities Noted: No Electronic Signature(s) Signed:  11/29/2015 5:40:45 PM By: [name] Entered By: [name] on 11/29/2015 15:28:04 [name] ([id number]) -------------------------------------------------------------------------------- Wound Assessment Details Patient Name: [name] Date of Service: 11/29/2015 3:30 PM Medical Record Number: [id number] Patient Account Number: [id number] Date of Birth/Sex: 1980/10/21 (34 y.o. Female) Treating RN: [name] Primary Care Physician: [name] Other Clinician: Referring Physician: [name] Treating Physician/Extender: Rudene Re in Treatment: 13 Wound Status Wound Number: 12 Primary Diabetic Wound/Ulcer of the Lower Etiology: Extremity Wound Location: Left Foot - Plantar, Circumfernential Wound Open Status: Wounding Event: Gradually Appeared Comorbid Anemia, Type I Diabetes, End Stage Date Acquired: 07/28/2015 History: Renal Disease, Rheumatoid Arthritis, Weeks Of Treatment: 13 Neuropathy Clustered Wound: No Pending Amputation On Presentation Photos Photo Uploaded By: [name] on 11/29/2015 16:29:23 Wound Measurements Length: (cm) 11 % Reduction in Width: (cm) 9 % Reduction in Depth: (cm) 0.2 Epithelializati Area: (cm) 77.754 Tunneling: Volume: (cm) 15.551 Undermining: Area: -80% Volume: -260% on: None No No Wound Description Classification: Grade 2 Foul Odor After Wound Margin: Indistinct, nonvisible Due to Product Exudate Amount: Large Exudate Type: Serous Exudate Color: amber Cleansing: Yes Use: No Wound Bed Granulation Amount: Small (1-33%) Exposed Structure Granulation Quality: Red Fascia Exposed: No [name], [name] ([id number]) Necrotic Amount: Large (67-100%) Fat Layer Exposed: No Necrotic Quality: Eschar, Adherent Slough Tendon Exposed: No Muscle Exposed: No Joint Exposed: No Bone Exposed: No Limited to Skin Breakdown Periwound Skin Texture Texture Color No Abnormalities Noted:  No No Abnormalities Noted: No Callus: No Atrophie Blanche: No Crepitus: No Cyanosis: No Excoriation: No Ecchymosis: No Fluctuance: No Erythema: No Friable: No Hemosiderin Staining: No Induration: No Mottled: No Localized Edema: Yes Pallor: No Rash: No Rubor: No Scarring: No Temperature / Pain Moisture Temperature: No Abnormality No Abnormalities Noted: No Dry / Scaly: No Maceration: No Moist: Yes Wound Preparation Ulcer Cleansing: Rinsed/Irrigated with Saline Topical Anesthetic Applied: None Treatment Notes Wound #12 (Left, Plantar, Circumferential Foot) 1. Cleansed with: Clean wound with Normal Saline 2. Anesthetic Topical Lidocaine 4% cream to wound bed prior to debridement 4. Dressing Applied: Aquacel Ag Other dressing (specify in notes) 5. Secondary Dressing Applied Guaze, ABD and kerlix/Conform 7. Secured with Tape Notes betadine paint, stretch netting Electronic Signature(s) [name], [name] ([id number]) Signed: 11/29/2015 5:40:45 PM By: [name] Entered By: [name] on 11/29/2015 15:36:27 [name] ([id number]) -------------------------------------------------------------------------------- Wound Assessment Details Patient Name: [name] Date of Service: 11/29/2015 3:30 PM Medical Record Number: [id number] Patient Account Number: [id number] Date of Birth/Sex: 20-Feb-1981 (34 y.o. Female) Treating RN: [name] Primary Care Physician: [name] Other Clinician: Referring Physician: [name] Treating Physician/Extender: Rudene Re in Treatment: 13 Wound Status Wound Number: 14 Primary Diabetic Wound/Ulcer of the Lower Etiology: Extremity Wound Location: Right, Plantar, Circumferential Foot Wound Status: Amputation Wounding Event: Gradually Appeared Outcome Below Knee Level: Date Acquired: 07/28/2015  Weeks Of Treatment: 13 Clustered Wound: Yes Pending Amputation On Presentation Wound  Description Classification: Grade 3 Periwound Skin Texture Texture Color No Abnormalities Noted: No No Abnormalities Noted: No Moisture No Abnormalities Noted: No Electronic Signature(s) Signed: 11/29/2015 5:40:45 PM By: [name] Entered By: [name] on 11/29/2015 15:28:04 [name] ([id number]) -------------------------------------------------------------------------------- Wound Assessment Details Patient Name: [name] Date of Service: 11/29/2015 3:30 PM Medical Record Number: [id number] Patient Account Number: [id number] Date of Birth/Sex: Sep 14, 1981 (34 y.o. Female) Treating RN: [name] Primary Care Physician: [name] Other Clinician: Referring Physician: [name] Treating Physician/Extender: Rudene Re in Treatment: 13 Wound Status Wound Number: 9 Primary Diabetic Wound/Ulcer of the Lower Etiology: Extremity Wound Location: Left Calcaneous Wound Open Wounding Event: Pressure Injury Status: Date Acquired: 07/28/2015 Comorbid Anemia, Type I Diabetes, End Stage Weeks Of Treatment: 13 History: Renal Disease, Rheumatoid Arthritis, Clustered Wound: No Neuropathy Photos Photo Uploaded By: [name] on 11/29/2015 16:31:30 Wound Measurements Length: (cm) 4 Width: (cm) 11.5 Depth: (cm) 0.1 Area: (cm) 36.128 Volume: (cm) 3.613 % Reduction in Area: -94.5% % Reduction in Volume: -94.6% Epithelialization: None Tunneling: No Undermining: No Wound Description Classification: Grade 2 Foul Odor Afte Wound Margin: Distinct, outline attached Due to Product Exudate Amount: Small Exudate Type: Serous Exudate Color: amber r Cleansing: Yes Use: No Wound Bed Granulation Amount: None Present (0%) Exposed Structure Necrotic Amount: Large (67-100%) Fascia Exposed: No Necrotic Quality: Eschar, Adherent Slough Fat Layer Exposed: No Tendon Exposed: No [name], [name] ([id number]) Muscle Exposed: No Joint  Exposed: No Bone Exposed: No Limited to Skin Breakdown Periwound Skin Texture Texture Color No Abnormalities Noted: No No Abnormalities Noted: No Callus: No Atrophie Blanche: No Crepitus: No Cyanosis: No Excoriation: No Ecchymosis: No Fluctuance: No Erythema: No Friable: No Hemosiderin Staining: No Induration: No Mottled: No Localized Edema: No Pallor: No Rash: No Rubor: No Scarring: No Temperature / Pain Moisture Temperature: No Abnormality No Abnormalities Noted: No Tenderness on Palpation: Yes Dry / Scaly: No Maceration: No Moist: Yes Wound Preparation Ulcer Cleansing: Rinsed/Irrigated with Saline Topical Anesthetic Applied: Other: lidocaine 4%, Treatment Notes Wound #9 (Left Calcaneous) 1. Cleansed with: Clean wound with Normal Saline 2. Anesthetic Topical Lidocaine 4% cream to wound bed prior to debridement 4. Dressing Applied: Aquacel Ag Other dressing (specify in notes) 5. Secondary Dressing Applied Guaze, ABD and kerlix/Conform 7. Secured with Tape Notes betadine paint, stretch netting Electronic Signature(s) Signed: 11/29/2015 5:40:45 PM By: [name] Entered By: [name] on 11/29/2015 15:38:53 [name] ([id number]) [name], [name] ([id number]) -------------------------------------------------------------------------------- Vitals Details Patient Name: [name] Date of Service: 11/29/2015 3:30 PM Medical Record Number: [id number] Patient Account Number: [id number] Date of Birth/Sex: 20-Apr-1981 (34 y.o. Female) Treating RN: [name] Primary Care Physician: [name] Other Clinician: Referring Physician: [name] Treating Physician/Extender: Rudene Re in Treatment: 13 Vital Signs Time Taken: 15:18 Pulse (bpm): 83 Height (in): 68 Respiratory Rate (breaths/min): 16 Source: Stated Blood Pressure (mmHg): 88/53 Weight (lbs): 96 Reference Range: 80 - 120 mg / dl Source:  Stated Body Mass Index (BMI): 14.6 Electronic Signature(s) Signed: 11/29/2015 5:40:45 PM By: [name] Entered By: [name] on 11/29/2015 15:19:22

## 2015-11-30 NOTE — Progress Notes (Addendum)
[name], [name] ([id number]) Visit Report for 11/29/2015 Chief Complaint Document Details Patient Name: [name], [name] Date of Service: 11/29/2015 3:30 PM Medical Record Number: [id number] Patient Account Number: [id number] Date of Birth/Sex: [birth year] (34 y.o. Female) Treating RN: [name] Primary Care Physician: [name] Other Clinician: Referring Physician: [name] Treating Physician/Extender: Rudene Re in Treatment: 13 Information Obtained from: Patient Chief Complaint Patient in today for treatment of non-healing wound and HBO Treatment. she has just gotten out of hospital this week and is back to resume her hyperbaric oxygen therapy Electronic Signature(s) Signed: 11/29/2015 4:24:15 PM By: [name] MD, FACS Entered By: [name] on 11/29/2015 16:24:15 [name] ([id number]) -------------------------------------------------------------------------------- Debridement Details Patient Name: [name] Date of Service: 11/29/2015 3:30 PM Medical Record Number: [id number] Patient Account Number: [id number] Date of Birth/Sex: [birth year] (34 y.o. Female) Treating RN: [name] Primary Care Physician: [name] Other Clinician: Referring Physician: [name] Treating Physician/Extender: Rudene Re in Treatment: 13 Debridement Performed for Wound #12 Left,Plantar,Circumferential Foot Assessment: Performed By: Physician [name], MD Debridement: Debridement Pre-procedure Yes Verification/Time Out Taken: Start Time: 15:51 Pain Control: Lidocaine 4% Topical Solution Level: Skin/Subcutaneous Tissue Total Area Debrided (L x 5 (cm) x 4 (cm) = 20 (cm) W): Tissue and other Non-Viable, Eschar, Fibrin/Slough material debrided: Instrument: Forceps, Scissors Bleeding: Minimum Hemostasis Achieved: Pressure End Time: 15:56 Procedural Pain: 0 Post Procedural Pain: 0 Response to Treatment:  Procedure was tolerated well Post Debridement Measurements of Total Wound Length: (cm) 11 Width: (cm) 9 Depth: (cm) 0.2 Volume: (cm) 15.551 Post Procedure Diagnosis Same as Pre-procedure Electronic Signature(s) Signed: 11/29/2015 4:23:45 PM By: [name] MD, FACS Signed: 11/29/2015 5:40:45 PM By: [name] Entered By: [name] on 11/29/2015 16:23:45 [name] ([id number]) -------------------------------------------------------------------------------- Debridement Details Patient Name: [name] Date of Service: 11/29/2015 3:30 PM Medical Record Number: [id number] Patient Account Number: [id number] Date of Birth/Sex: July 18, 1981 (34 y.o. Female) Treating RN: [name] Primary Care Physician: [name] Other Clinician: Referring Physician: [name] Treating Physician/Extender: Rudene Re in Treatment: 13 Debridement Performed for Wound #9 Left Calcaneous Assessment: Performed By: Physician [name], MD Debridement: Debridement Pre-procedure Yes Verification/Time Out Taken: Start Time: 15:56 Pain Control: Lidocaine 4% Topical Solution Level: Skin/Subcutaneous Tissue Total Area Debrided (L x 4 (cm) x 4 (cm) = 16 (cm) W): Tissue and other Non-Viable, Eschar, Fibrin/Slough material debrided: Instrument: Forceps, Scissors Bleeding: Minimum Hemostasis Achieved: Silver Nitrate End Time: 16:01 Procedural Pain: 0 Post Procedural Pain: 0 Response to Treatment: Procedure was tolerated well Post Debridement Measurements of Total Wound Length: (cm) 4 Width: (cm) 9 Depth: (cm) 0.1 Volume: (cm) 2.827 Post Procedure Diagnosis Same as Pre-procedure Electronic Signature(s) Signed: 11/29/2015 4:24:08 PM By: [name] MD, FACS Signed: 11/29/2015 5:40:45 PM By: [name] Entered By: [name] on 11/29/2015 16:24:08 [name]  ([id number]) -------------------------------------------------------------------------------- HPI Details Patient Name: [name] Date of Service: 11/29/2015 3:30 PM Medical Record Number: [id number] Patient Account Number: [id number] Date of Birth/Sex: [birth year] (34 y.o. Female) Treating RN: Phillis Haggis Primary Care Physician: [name] Other Clinician: Referring Physician: [name] Treating Physician/Extender: Rudene Re in Treatment: 13 History of Present Illness Location: dry gangrene both feet and heels Quality: Patient reports No Pain. Severity: Patient states wound are getting worse. Duration: Patient has had the wound for > 4months prior to seeking treatment at the wound center Context: The wound appeared gradually over time Modifying Factors: she has been in and out of hospital  over the last 2 months Associated Signs and Symptoms: Patient reports having difficulty standing for long periods. HPI Description: [name] is a 35 y.o. female who presents to our wound center, back in June 2016, referred by her PCP Dr. Zada Finders for nonhealing ulcers on the lateral aspect of the right heel. Of note she has a history of type 1 diabetes mellitus that has been uncontrolled. Past medical history significant for type 1 diabetes mellitus not controlled, ankylosing spondylitis, anorexia nervosa, irritable bowel syndrome, chronic kidney disease, chronic diarrhea. she then developed gangrene of both feet due to severe peripheral vascular disease and also had gangrene of the tips of her fingers due to upper extremity vascular disease. She was being worked up by vascular surgery at [facility] and at [facility] and has had several procedures done there. She started with hyperbaric oxygen therapy and had a total of 40 treatments the last one being on 06/20/2015. After the initial treatment of hyperbaric oxygen therapy she started having ear problems and had ultimately to  use myringotomy tubes and this was done bilaterally. Since then her ears have been doing fine. In late September, she had seen vascular and hand surgeons. since then she's been in [place] at the rec center for surgery involving extensive vascular procedures for the upper extremities. She was then at [facility] with GI bleeds both upper and lower and has been in and out of hospital for that. She has recently been out of hospital for the last week. 09/09/2015 -- she was unable to get here in time to start her hyperbaric oxygen today and hence is only here for a wound care visit. 09/19/2015 -- she has been having vancomycin during her dialysis and continues to have vascular appointments and the procedure is been set for early January. She has been unable to make it for her hemodialysis due to various medical symptoms. 09/30/2014 -- her vancomycin was stopped on 09/25/2015 and the mother has noticed the right foot has started draining for the last 3 days. Addendum: after examining the patient I was able to talk to her primary vascular surgeon Dr. Pernell Dupre at the [facility]. I told him about the necrotic area on the plantar aspect of right foot which is now wet gangrene and he agreed with me that he would admit her at [facility] under her care and synchronize further treatment. We have discussed her poor prognosis and he and I discussed the need for hospice care and for sitting down and talking to the patient and her mother and giving them a proper detail of the prognosis. 11/29/2014 -- She was admitted to the [facility] on January 3 and discharged on January 25 and had Price, CHESTINE BELKNAP. 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 She was worked up with MRIs, abdomen aortogram and placement of left-sided angioplasties were done. After a prolonged hospital she she was discharged home and was told to wear shrinker sock and stump protector and see her surgeon for further instructions regarding wound care and suture removal. Asked to take long-term doxycycline. Electronic Signature(s) Signed: 11/29/2015 4:24:28 PM By: [name] MD, FACS Previous Signature: 11/29/2015 3:17:48 PM Version By: [name] MD, FACS Entered By: [name] on 11/29/2015 16:24:28 [name], [name] (  [id number]) -------------------------------------------------------------------------------- Physical Exam Details Patient Name: VALENCIA, KASSA. Date of Service: 11/29/2015 3:30 PM Medical Record Number: [id number] Patient Account Number: [id number] Date of Birth/Sex: 03-21-1981 (34 y.o. Female) Treating RN: [name] Primary Care Physician: [name] Other Clinician: Referring Physician: [name] Treating Physician/Extender: Rudene Re in Treatment: 13 Constitutional . Pulse regular. Respirations normal and unlabored. Afebrile. . Eyes Nonicteric. Reactive to light. Ears, Nose, Mouth, and Throat Lips, teeth, and gums WNL.Marland Kitchen Moist mucosa without lesions. Neck supple and nontender. No palpable supraclavicular or cervical adenopathy. Normal sized without goiter. Respiratory WNL. No retractions.. Cardiovascular Pedal Pulses WNL. No clubbing, cyanosis or edema. Lymphatic No adneopathy. No adenopathy. No adenopathy. Musculoskeletal Adexa without tenderness or enlargement.. Digits and nails w/o clubbing, cyanosis, infection, petechiae, ischemia, or inflammatory conditions.. Integumentary (Hair, Skin) No suspicious lesions. No crepitus or fluctuance. No peri-wound warmth or erythema. No masses.Marland Kitchen Psychiatric Judgement and insight Intact.. No evidence of  depression, anxiety, or agitation.. Notes the right below-knee stump is healing well and has a shrinker applied. The left forefoot has dry gangrene around the first and second toe and the plantar aspect of her forefoot and also the heel. Some of the dry eschar is now separating and with a forcep and scissors I have removed a large quantity of it down to his some clean granulation tissue. There is no evidence of cellulitis, sepsis or abscess Electronic Signature(s) Signed: 11/29/2015 4:25:46 PM By: [name] MD, FACS Entered By: [name] on 11/29/2015 16:25:46 [name] ([id number]) -------------------------------------------------------------------------------- Physician Orders Details Patient Name: [name] Date of Service: 11/29/2015 3:30 PM Medical Record Number: [id number] Patient Account Number: [id number] Date of Birth/Sex: [birth year] (34 y.o. Female) Treating RN: [name] Primary Care Physician: [name] Other Clinician: Referring Physician: [name] Treating Physician/Extender: Rudene Re in Treatment: 76 Verbal / Phone Orders: Yes Clinician: [name] Read Back and Verified: Yes Diagnosis Coding Wound Cleansing Wound #12 Left,Plantar,Circumferential Foot o Cleanse wound with mild soap and water Wound #9 Left Calcaneous o Cleanse wound with mild soap and water Primary Wound Dressing Wound #12 Left,Plantar,Circumferential Foot o Aquacel Ag - to wet areas o Other: - betadine paint to dry areas Wound #9 Left Calcaneous o Aquacel Ag - to wet areas o Other: - betadine paint to dry areas Secondary Dressing Wound #12 Left,Plantar,Circumferential Foot o Gauze and Kerlix/Conform Wound #9 Left Calcaneous o Gauze and Kerlix/Conform Dressing Change Frequency Wound #12 Left,Plantar,Circumferential Foot o Change dressing every day. Wound #9 Left Calcaneous o Change dressing every day. Follow-up  Appointments Wound #12 Left,Plantar,Circumferential Foot o Other: - as patient is able Wound #9 Left Calcaneous o Other: - as patient is able [name], [name] ([id number]) Off-Loading Wound #12 Left,Plantar,Circumferential Foot o Open toe surgical shoe with peg assist. Wound #9 Left Calcaneous o Open toe surgical shoe with peg assist. Home Health Wound #12 Left,Plantar,Circumferential Foot o Continue Home Health Visits - Advanced o Home Health Nurse may visit PRN to address patientos wound care needs. o FACE TO FACE ENCOUNTER: MEDICARE and MEDICAID PATIENTS: I certify that this patient is under my care and that I had a face-to-face encounter that meets the physician face-to-face encounter requirements with this patient on this date. The encounter with the patient was in whole or in part for the following MEDICAL CONDITION: (primary reason for Home Healthcare) MEDICAL NECESSITY: I certify, that based on my findings, NURSING services are a medically necessary home health service. HOME BOUND STATUS: I certify that my clinical  findings support that this patient is homebound (i.e., Due to illness or injury, pt requires aid of supportive devices such as crutches, cane, wheelchairs, walkers, the use of special transportation or the assistance of another person to leave their place of residence. There is a normal inability to leave the home and doing so requires considerable and taxing effort. Other absences are for medical reasons / religious services and are infrequent or of short duration when for other reasons). o If current dressing causes regression in wound condition, may D/C ordered dressing product/s and apply Normal Saline Moist Dressing daily until next Wound Healing Center / Other MD appointment. Notify Wound Healing Center of regression in wound condition at [phone]. o Please direct any NON-WOUND related issues/requests for orders to patient's Primary  Care Physician Wound #9 Left Calcaneous o Continue Home Health Visits - Advanced o Home Health Nurse may visit PRN to address patientos wound care needs. o FACE TO FACE ENCOUNTER: MEDICARE and MEDICAID PATIENTS: I certify that this patient is under my care and that I had a face-to-face encounter that meets the physician face-to-face encounter requirements with this patient on this date. The encounter with the patient was in whole or in part for the following MEDICAL CONDITION: (primary reason for Home Healthcare) MEDICAL NECESSITY: I certify, that based on my findings, NURSING services are a medically necessary home health service. HOME BOUND STATUS: I certify that my clinical findings support that this patient is homebound (i.e., Due to illness or injury, pt requires aid of supportive devices such as crutches, cane, wheelchairs, walkers, the use of special transportation or the assistance of another person to leave their place of residence. There is a normal inability to leave the home and doing so requires considerable and taxing effort. Other absences are for medical reasons / religious services and are infrequent or of short duration when for other reasons). o If current dressing causes regression in wound condition, may D/C ordered dressing product/s and apply Normal Saline Moist Dressing daily until next Wound Healing Center / Other MD appointment. Notify Wound Healing Center of regression in wound condition at [phone]. [name], [name] ([id number]) o Please direct any NON-WOUND related issues/requests for orders to patient's Primary Care Physician Electronic Signature(s) Signed: 11/29/2015 4:29:10 PM By: [name] MD, FACS Signed: 11/29/2015 5:40:45 PM By: [name] Entered By: [name] on 11/29/2015 16:12:04 [name] ([id number]) -------------------------------------------------------------------------------- Problem List Details Patient  Name: [name] Date of Service: 11/29/2015 3:30 PM Medical Record Number: [id number] Patient Account Number: [id number] Date of Birth/Sex: Oct 21, 1980 (34 y.o. 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 08/30/2015 Yes of other part of foot I70.261 Atherosclerosis of native arteries of extremities with 08/30/2015 Yes gangrene, right leg L89.622 Pressure ulcer of left heel, stage 2 08/30/2015 Yes L02.611 Cutaneous abscess of right foot 10/01/2015 Yes Z89.511 Acquired absence of right leg below knee 11/29/2015 Yes Inactive Problems Resolved Problems Electronic Signature(s) Signed: 11/29/2015 4:23:05 PM By: [name] MD, FACS Entered By: [name] on 11/29/2015 16:23:05 [name] ([id number]) -------------------------------------------------------------------------------- Progress Note Details Patient Name: [name] Date of Service: 11/29/2015 3:30 PM Medical Record Number: [id number]  Patient Account Number: [id number] Date of Birth/Sex: 1981/04/08 (34 y.o. Female) Treating RN: [name] Primary Care Physician: [name] Other Clinician: Referring Physician: [name] Treating Physician/Extender: Rudene Re in Treatment: 13 Subjective Chief Complaint Information obtained from Patient Patient in today for treatment of non-healing wound and HBO Treatment. she has just gotten out of hospital this week and is back to resume her hyperbaric oxygen therapy History of Present Illness  (HPI) The following HPI elements were documented for the patient's wound: Location: dry gangrene both feet and heels Quality: Patient reports No Pain. Severity: Patient states wound are getting worse. Duration: Patient has had the wound for > 4months prior to seeking treatment at the wound center Context: The wound appeared gradually over time Modifying Factors: she has been in and out of hospital over the last 2 months Associated Signs and Symptoms: Patient reports having difficulty standing for long periods. Parveen Freehling is a 35 y.o. female who presents to our wound center, back in June 2016, referred by her PCP Dr. Zada Finders for nonhealing ulcers on the lateral aspect of the right heel. Of note she has a history of type 1 diabetes mellitus that has been uncontrolled. Past medical history significant for type 1 diabetes mellitus not controlled, ankylosing spondylitis, anorexia nervosa, irritable bowel syndrome, chronic kidney disease, chronic diarrhea. she then developed gangrene of both feet due to severe peripheral vascular disease and also had gangrene of the tips of her fingers due to upper extremity vascular disease. She was being worked up by vascular surgery at [facility] and at [facility] and has had several procedures done there. She started with hyperbaric oxygen therapy and had a total of 40 treatments the last one being on 06/20/2015. After the initial treatment of hyperbaric oxygen therapy she started having ear problems and had ultimately to use myringotomy tubes and this was done bilaterally. Since then her ears have been doing fine. In late September, she had seen vascular and hand surgeons. since then she's been in [place] at the rec center for surgery involving extensive vascular procedures for the upper extremities. She was then at [facility] with GI bleeds both upper and lower and has been in and out of hospital for that. She has recently been out of hospital for the last  week. 09/09/2015 -- she was unable to get here in time to start her hyperbaric oxygen today and hence is only here for a wound care visit. 09/19/2015 -- she has been having vancomycin during her dialysis and continues to have vascular appointments and the procedure is been set for early January. She has been unable to make it for her hemodialysis due to various medical symptoms. 09/30/2014 -- her vancomycin was stopped on 09/25/2015 and the mother has noticed the right foot has [name], [name] ([id number]) started draining for the last 3 days. Addendum: after examining the patient I was able to talk to her primary vascular surgeon Dr. Pernell Dupre at the [facility]. I told him about the necrotic area on the plantar aspect of right foot which is now wet gangrene and he agreed with me that he would admit her at [facility] under her care and synchronize further treatment. We have discussed her poor prognosis and he and I discussed the need for hospice care and for sitting down and talking to the patient and her mother and giving them a proper detail of the prognosis. 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 She was worked up with MRIs, abdomen aortogram and placement of left-sided angioplasties were done. After a prolonged hospital she she was discharged home and was told to wear shrinker sock and stump protector and see her surgeon for further instructions regarding wound care and suture removal. Asked to take long-term doxycycline. Objective Constitutional Pulse regular. Respirations normal and  unlabored. Afebrile. Vitals Time Taken: 3:18 PM, Height: 68 in, Source: Stated, Weight: 96 lbs, Source: Stated, BMI: 14.6, Pulse: 83 bpm, Respiratory Rate: 16 breaths/min, Blood Pressure: 88/53 mmHg. Eyes Nonicteric. Reactive to light. Ears, Nose, Mouth, and Throat Lips, teeth, and gums WNL.Marland Kitchen Moist mucosa without lesions. Neck supple and nontender. No palpable supraclavicular or cervical adenopathy. Normal sized without goiter. Respiratory WNL. No retractions.. Cardiovascular Pedal Pulses WNL. No clubbing, cyanosis or edema. Lymphatic No adneopathy. No adenopathy. No adenopathy. Musculoskeletal [name], [name] ([id number]) Adexa without tenderness or enlargement.. Digits and nails w/o clubbing, cyanosis, infection, petechiae, ischemia, or inflammatory conditions.Marland Kitchen Psychiatric Judgement and insight Intact.. No evidence of depression, anxiety, or agitation.. General Notes: the right below-knee stump is healing well and has a shrinker applied. The left forefoot has dry gangrene around the first and second toe and the plantar aspect of her forefoot and also the heel. Some of the dry eschar is now separating and with a forcep and scissors I have removed a large quantity of it down to his some clean granulation tissue. There is no evidence of cellulitis, sepsis or abscess Integumentary (Hair, Skin) No suspicious lesions. No crepitus or fluctuance. No peri-wound warmth or erythema. No masses.. Wound #10 status is Amputation. Original cause of wound was Pressure Injury. The wound is located on the Right,Circumferential Calcaneous. Wound #12 status is Open. Original cause of wound was Gradually Appeared. The wound is located on the Left,Plantar,Circumferential Foot. The wound measures 11cm length x 9cm width x 0.2cm depth; 77.754cm^2 area and 15.551cm^3 volume. The wound is limited to skin breakdown. There is no tunneling or undermining noted. There is a large amount of serous drainage  noted. The wound margin is indistinct and nonvisible. There is small (1-33%) red granulation within the wound bed. There is a large (67-100%) amount of necrotic tissue within the wound bed including Eschar and Adherent Slough. The periwound skin appearance exhibited: Localized Edema, Moist. The periwound skin appearance did not exhibit: Callus, Crepitus, Excoriation, Fluctuance, Friable, Induration, Rash, Scarring, Dry/Scaly, Maceration, Atrophie Blanche, Cyanosis, Ecchymosis, Hemosiderin Staining, Mottled, Pallor, Rubor, Erythema. Periwound temperature was noted as No Abnormality. Wound #14 status is Amputation. Original cause of wound was Gradually Appeared. The wound is located on the Right,Plantar,Circumferential Foot. Wound #9 status is Open. Original cause of wound was Pressure Injury. The wound is located on the Left Calcaneous. The wound measures 4cm length x 11.5cm width x 0.1cm depth; 36.128cm^2 area and 3.613cm^3 volume. The wound is limited to skin breakdown. There is no tunneling or undermining noted. There is a small amount of serous drainage noted. The wound margin is distinct with the outline attached to the wound base. There is no granulation within the wound bed. There is a large (67-100%) amount of necrotic tissue within the wound bed including Eschar and Adherent Slough. The periwound skin appearance  exhibited: Moist. The periwound skin appearance did not exhibit: Callus, Crepitus, Excoriation, Fluctuance, Friable, Induration, Localized Edema, Rash, Scarring, Dry/Scaly, Maceration, Atrophie Blanche, Cyanosis, Ecchymosis, Hemosiderin Staining, Mottled, Pallor, Rubor, Erythema. Periwound temperature was noted as No Abnormality. The periwound has tenderness on palpation. 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 Recommended application of Betadine paint to the dry eschar and in areas where debridement was done today and subcutaneous tissues visible have recommended Aquacel Ag. Discussed in great detail the signs of sepsis and wet gangrene and have told them to be vigilant about this. Due to her various appointments she will be back to see as soon as next week if possible Procedures Wound #12 Wound #12 is a Diabetic Wound/Ulcer of the Lower Extremity located on the Left,Plantar,Circumferential Foot . There was a Skin/Subcutaneous Tissue Debridement (88916-94503) debridement with total area of 20 sq cm performed by [name], MD. with the following instrument(s): Forceps and Scissors to remove Non-Viable tissue/material including Fibrin/Slough and Eschar after achieving pain control using Lidocaine 4% Topical Solution. A time out was conducted prior to the start of the procedure. A Minimum amount of bleeding was controlled with Pressure. The procedure was tolerated well with a pain level of 0 throughout and a pain level of 0 following the procedure. Post Debridement Measurements: 11cm length x 9cm width x 0.2cm depth; 15.551cm^3 volume. Post procedure Diagnosis Wound #12: Same as Pre-Procedure Wound #9 Wound #9 is a Diabetic Wound/Ulcer of the Lower Extremity located on the Left Calcaneous . There was a Skin/Subcutaneous Tissue Debridement ([id number]) debridement with total area of 16 sq cm performed by  [name], MD. with the following instrument(s): Forceps and Scissors to remove Non-Viable tissue/material including Fibrin/Slough and Eschar after achieving pain control using Lidocaine 4% Topical Solution. A time out was conducted prior to the start of the procedure. A Minimum amount of bleeding was controlled with Silver Nitrate. The procedure was tolerated well with a pain level of 0 throughout and a pain level of 0 following the procedure. Post Debridement Measurements: 4cm length x 9cm width x 0.1cm depth; 2.827cm^3 volume. Post procedure Diagnosis Wound #9: Same as Pre-Procedure [name], [name] ([id number]) Plan Wound Cleansing: Wound #12 Left,Plantar,Circumferential Foot: Cleanse wound with mild soap and water Wound #9 Left Calcaneous: Cleanse wound with mild soap and water Primary Wound Dressing: Wound #12 Left,Plantar,Circumferential Foot: Aquacel Ag - to wet areas Other: - betadine paint to dry areas Wound #9 Left Calcaneous: Aquacel Ag - to wet areas Other: - betadine paint to dry areas Secondary Dressing: Wound #12 Left,Plantar,Circumferential Foot: Gauze and Kerlix/Conform Wound #9 Left Calcaneous: Gauze and Kerlix/Conform Dressing Change Frequency: Wound #12 Left,Plantar,Circumferential Foot: Change dressing every day. Wound #9 Left Calcaneous: Change dressing every day. Follow-up Appointments: Wound #12 Left,Plantar,Circumferential Foot: Other: - as patient is able Wound #9 Left Calcaneous: Other: - as patient is able Off-Loading: Wound #12 Left,Plantar,Circumferential Foot: Open  toe surgical shoe with peg assist. Wound #9 Left Calcaneous: Open toe surgical shoe with peg assist. Home Health: Wound #12 Left,Plantar,Circumferential Foot: Continue Home Health Visits - [facility] may visit [facility] to address patient s wound care needs. FACE TO FACE ENCOUNTER: MEDICARE and MEDICAID PATIENTS: I certify that this patient is under my care and  that I had a face-to-face encounter that meets the physician face-to-face encounter requirements with this patient on this date. The encounter with the patient was in whole or in part for the following MEDICAL CONDITION: (primary reason for Home Healthcare) MEDICAL NECESSITY: I certify, that based on my findings, NURSING services are a medically necessary home health service. HOME BOUND STATUS: I certify that my clinical findings support that this patient is homebound (i.e., Due to [name], [name] DOWIE. ([id number]) illness or injury, pt requires aid of supportive devices such as crutches, cane, wheelchairs, walkers, the use of special transportation or the assistance of another person to leave their place of residence. There is a normal inability to leave the home and doing so requires considerable and taxing effort. Other absences are for medical reasons / religious services and are infrequent or of short duration when for other reasons). If current dressing causes regression in wound condition, may D/C ordered dressing product/s and apply Normal Saline Moist Dressing daily until next Wound Healing Center / Other MD appointment. Notify Wound Healing Center of regression in wound condition at [phone]. Please direct any NON-WOUND related issues/requests for orders to patient's Primary Care Physician Wound #9 Left Calcaneous: Continue Home Health Visits - [facility] Nurse may visit [facility] to address patient s wound care needs. FACE TO FACE ENCOUNTER: MEDICARE and MEDICAID PATIENTS: I certify that this patient is under my care and that I had a face-to-face encounter that meets the physician face-to-face encounter requirements with this patient on this date. The encounter with the patient was in whole or in part for the following MEDICAL CONDITION: (primary reason for Home Healthcare) MEDICAL NECESSITY: I certify, that based on my findings, NURSING services are a medically necessary home  health service. HOME BOUND STATUS: I certify that my clinical findings support that this patient is homebound (i.e., Due to illness or injury, pt requires aid of supportive devices such as crutches, cane, wheelchairs, walkers, the use of special transportation or the assistance of another person to leave their place of residence. There is a normal inability to leave the home and doing so requires considerable and taxing effort. Other absences are for medical reasons / religious services and are infrequent or of short duration when for other reasons). If current dressing causes regression in wound condition, may D/C ordered dressing product/s and apply Normal Saline Moist Dressing daily until next Wound Healing Center / Other MD appointment. Notify Wound Healing Center of regression in wound condition at [phone]. Please direct any NON-WOUND related issues/requests for orders to patient's Primary Care Physician Having reviewed her recent hospitalization and seeing her back after 2 months, I have gone through all her notes from [facility] and have summarized her treatment plan. Recommended application of Betadine paint to the dry eschar and in areas where debridement was done today and subcutaneous tissues visible have recommended Aquacel Ag. Discussed in great detail the signs of sepsis and wet gangrene and have told them to be vigilant about this. Due to her various appointments she will be back to see as soon as next week if possible Electronic Signature(s) Signed: 11/29/2015 4:27:42 PM  By: [name] MD, FACS Entered By: [name] on 11/29/2015 16:27:41 [name] ([id number]) -------------------------------------------------------------------------------- SuperBill Details Patient Name: [name] Date of Service: 11/29/2015 Medical Record Number: [id number] Patient Account Number: [id number] Date of Birth/Sex: 1981-03-03 (34 y.o. 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 L89.622 Pressure ulcer of left heel, stage 2 Z89.511 Acquired absence of right leg below knee Facility Procedures CPT4: Description Modifier Quantity Code [id number] 11042 - DEB SUBQ TISSUE 20 SQ CM/< 1 ICD-10 Description Diagnosis E10.621 Type 1 diabetes mellitus with foot ulcer E10.52 Type 1 diabetes mellitus with diabetic peripheral angiopathy with gangrene I70.245  Atherosclerosis of native arteries of left leg with ulceration of other part of foot L89.622 Pressure ulcer of left heel, stage 2 CPT4: [id number] 11045 - DEB SUBQ TISS EA ADDL 20CM 1 ICD-10 Description Diagnosis E10.621 Type 1 diabetes mellitus with foot ulcer E10.52 Type 1 diabetes mellitus with diabetic peripheral angiopathy with gangrene I70.245 Atherosclerosis of native arteries  of left leg with ulceration of other part of foot L89.622 Pressure ulcer of left heel, stage 2 Physician Procedures CPT4: Description Modifier Quantity Code [id number] - WC PHYS LEVEL 3 - EST PT 25 1 ICD-10 Description Diagnosis YANELLI, ZAPANTA ([id number]) Electronic Signature(s) Signed: 11/29/2015 4:28:36 PM By: [name] MD, FACS Entered By: [name] on 11/29/2015 16:28:36

## 2015-12-05 ENCOUNTER — Encounter: Payer: Medicare Other | Admitting: Surgery

## 2015-12-05 DIAGNOSIS — E10621 Type 1 diabetes mellitus with foot ulcer: Secondary | ICD-10-CM | POA: Diagnosis not present

## 2015-12-06 NOTE — Progress Notes (Signed)
WANNA, GULLY ([id number]) Visit Report for 12/05/2015 Chief Complaint Document Details Patient Name: [name], [name] Date of Service: 12/05/2015 2:15 PM Medical Record Number: [id number] Patient Account Number: [id number] Date of Birth/Sex: June 14, 1981 (35 y.o. Female) Treating RN: [name] Primary Care Physician: [name] Other Clinician: Referring Physician: [name] Treating Physician/Extender: Rudene Re in Treatment: 13 Information Obtained from: Patient Chief Complaint Patient in today for treatment of non-healing wound and HBO Treatment. she has just gotten out of hospital this week and is back to resume her hyperbaric oxygen therapy Electronic Signature(s) Signed: 12/05/2015 2:28:01 PM By: [name] MD, FACS Entered By: [name] on 12/05/2015 14:28:01 [name] ([id number]) -------------------------------------------------------------------------------- HPI Details Patient Name: [name] Date of Service: 12/05/2015 2:15 PM Medical Record Number: [id number] Patient Account Number: [id number] Date of Birth/Sex: 1981/03/11 (35 y.o. Female) Treating RN: [name] Primary Care Physician: [name] Other Clinician: Referring Physician: [name] Treating Physician/Extender: Rudene Re in Treatment: 13 History of Present Illness Location: dry gangrene both feet and heels Quality: Patient reports No Pain. Severity: Patient states wound are getting worse. Duration: Patient has had the wound for > 4months prior to seeking treatment at the wound center Context: The wound appeared gradually over time Modifying Factors: she has been in and out of hospital over the last 2 months Associated Signs and Symptoms: Patient reports having difficulty standing for long periods. HPI Description: [name] is a 35 y.o. female who presents to our wound center, back in June 2016, referred by her PCP Dr.  Zada Finders for nonhealing ulcers on the lateral aspect of the right heel. Of note she has a history of type 1 diabetes mellitus that has been uncontrolled. Past medical history significant for type 1 diabetes mellitus not controlled, ankylosing spondylitis, anorexia nervosa, irritable bowel syndrome, chronic kidney disease, chronic diarrhea. she then developed gangrene of both feet due to severe peripheral vascular disease and also had gangrene of the tips of her fingers due to upper extremity vascular disease. She was being worked up by vascular surgery at [facility] and at [facility] and has had several procedures done there. She started with hyperbaric oxygen therapy and had a total of 40 treatments the last one being on 06/20/2015. After the initial treatment of hyperbaric oxygen therapy she started having ear problems and had ultimately to use myringotomy tubes and this was done bilaterally. Since then her ears have been doing fine. In late September, she had seen vascular and hand surgeons. since then she's been in [place] at the rec center for surgery involving extensive vascular procedures for the upper extremities. She was then at [facility] with GI bleeds both upper and lower and has been in and out of hospital for that. She has recently been out of hospital for the last week. 09/09/2015 -- she was unable to get here in time to start her hyperbaric oxygen today and hence is only here for a wound care visit. 09/19/2015 -- she has been having vancomycin during her dialysis and continues to have vascular appointments and the procedure is been set for early January. She has been unable to make it for her hemodialysis due to various medical symptoms. 09/30/2014 -- her vancomycin was stopped on 09/25/2015 and the mother has noticed the right foot has started draining for the last 3 days. Addendum: after examining the patient I was able to talk to her primary vascular surgeon Dr. Pernell Dupre at the [facility]. I told him about  the necrotic area on the plantar aspect of right foot which is now wet gangrene and he agreed with me that he would admit her at [facility] under her care and synchronize further treatment. We have discussed her poor prognosis and he and I discussed the need for hospice care and for sitting down and talking to the patient and her mother and giving them a proper detail of the prognosis. 11/29/2014 -- She was admitted to the [facility] on January 3 and discharged on January 25 and had [employer], MYIESHA EDGAR. 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 She was worked up with MRIs, abdomen aortogram and placement of left-sided angioplasties were done. After a prolonged hospital she she was discharged home and was told to wear shrinker sock and stump protector and see her surgeon for further instructions regarding wound care and suture removal. Asked to take long-term doxycycline. Electronic Signature(s) Signed: 12/05/2015 2:28:08 PM By: [name] MD, FACS Entered By: [name] on 12/05/2015 14:28:08 [name] ([id number]) -------------------------------------------------------------------------------- Physical Exam Details Patient Name: [name] Date of Service: 12/05/2015 2:15 PM Medical Record Number: [id number] Patient Account Number: [id number] Date of Birth/Sex: Jun 06, 1981 (35 y.o. Female) Treating RN: [name] Primary Care Physician: [name] Other Clinician: Referring Physician: [name] Treating Physician/Extender: Rudene Re in Treatment: 13 Constitutional . Pulse regular. Respirations normal and  unlabored. Afebrile. . Eyes Nonicteric. Reactive to light. Ears, Nose, Mouth, and Throat Lips, teeth, and gums WNL.Marland Kitchen Moist mucosa without lesions. Neck supple and nontender. No palpable supraclavicular or cervical adenopathy. Normal sized without goiter. Respiratory WNL. No retractions.. Cardiovascular Pedal Pulses WNL. No clubbing, cyanosis or edema. Lymphatic No adneopathy. No adenopathy. No adenopathy. Musculoskeletal Adexa without tenderness or enlargement.. Digits and nails w/o clubbing, cyanosis, infection, petechiae, ischemia, or inflammatory conditions.. Integumentary (Hair, Skin) No suspicious lesions. No crepitus or fluctuance. No peri-wound warmth or erythema. No masses.Marland Kitchen Psychiatric Judgement and insight Intact.. No evidence of depression, anxiety, or agitation.. Notes the left forefoot has dry gangrene of the first and second toe and the plantar aspect of her forefoot and almost the entire heel. Was no eschar separating today and no dissection to be done. There is a minimal weight area at the base of the big toe and in the web space of the first and second toe. No evidence of wet gangrene. Electronic Signature(s) Signed: 12/05/2015 2:29:03 PM By: [name] MD, FACS Entered By: [name] on 12/05/2015 14:29:02 [name] ([id number]) -------------------------------------------------------------------------------- Physician Orders Details Patient Name: [name] Date of Service: 12/05/2015 2:15 PM Medical Record Number: [id number] Patient Account Number: [id number] Date of Birth/Sex: 03-31-1981 (35 y.o. Female) Treating RN: [name] Primary Care Physician: [name] Other Clinician: Referring Physician: [name] Treating Physician/Extender: Rudene Re in Treatment: 33 Verbal / Phone Orders: Yes Clinician: [name] Read Back and Verified: Yes Diagnosis Coding Wound Cleansing Wound #12  Left,Plantar,Circumferential Foot o Cleanse wound with mild soap and water Wound #9 Left Calcaneous o Cleanse wound with mild soap and water Primary Wound Dressing Wound #12 Left,Plantar,Circumferential Foot o Aquacel Ag - to wet areas o Other: - betadine paint to dry areas Wound #9 Left Calcaneous o Aquacel Ag - to wet areas o Other: - betadine  paint to dry areas Secondary Dressing Wound #12 Left,Plantar,Circumferential Foot o Gauze and Kerlix/Conform Wound #9 Left Calcaneous o Gauze and Kerlix/Conform Dressing Change Frequency Wound #12 Left,Plantar,Circumferential Foot o Change dressing every day. Wound #9 Left Calcaneous o Change dressing every day. Follow-up Appointments Wound #12 Left,Plantar,Circumferential Foot o Other: - as patient is able Wound #9 Left Calcaneous o Other: - as patient is able [name], [name] ([id number]) Off-Loading Wound #12 Left,Plantar,Circumferential Foot o Open toe surgical shoe with peg assist. Wound #9 Left Calcaneous o Open toe surgical shoe with peg assist. Home Health Wound #12 Left,Plantar,Circumferential Foot o Continue Home Health Visits - [facility] o Home Health Nurse may visit PRN to address patientos wound care needs. o FACE TO FACE ENCOUNTER: MEDICARE and MEDICAID PATIENTS: I certify that this patient is under my care and that I had a face-to-face encounter that meets the physician face-to-face encounter requirements with this patient on this date. The encounter with the patient was in whole or in part for the following MEDICAL CONDITION: (primary reason for Home Healthcare) MEDICAL NECESSITY: I certify, that based on my findings, NURSING services are a medically necessary home health service. HOME BOUND STATUS: I certify that my clinical findings support that this patient is homebound (i.e., Due to illness or injury, pt requires aid of supportive devices such as crutches, cane, wheelchairs,  walkers, the use of special transportation or the assistance of another person to leave their place of residence. There is a normal inability to leave the home and doing so requires considerable and taxing effort. Other absences are for medical reasons / religious services and are infrequent or of short duration when for other reasons). o If current dressing causes regression in wound condition, may D/C ordered dressing product/s and apply Normal Saline Moist Dressing daily until next Wound Healing Center / Other MD appointment. Notify Wound Healing Center of regression in wound condition at [phone]. o Please direct any NON-WOUND related issues/requests for orders to patient's Primary Care Physician Wound #9 Left Calcaneous o Continue Home Health Visits - [facility] o Home Health Nurse may visit PRN to address patientos wound care needs. o FACE TO FACE ENCOUNTER: MEDICARE and MEDICAID PATIENTS: I certify that this patient is under my care and that I had a face-to-face encounter that meets the physician face-to-face encounter requirements with this patient on this date. The encounter with the patient was in whole or in part for the following MEDICAL CONDITION: (primary reason for Home Healthcare) MEDICAL NECESSITY: I certify, that based on my findings, NURSING services are a medically necessary home health service. HOME BOUND STATUS: I certify that my clinical findings support that this patient is homebound (i.e., Due to illness or injury, pt requires aid of supportive devices such as crutches, cane, wheelchairs, walkers, the use of special transportation or the assistance of another person to leave their place of residence. There is a normal inability to leave the home and doing so requires considerable and taxing effort. Other absences are for medical reasons / religious services and are infrequent or of short duration when for other reasons). o If current dressing causes  regression in wound condition, may D/C ordered dressing product/s and apply Normal Saline Moist Dressing daily until next Wound Healing Center / Other MD appointment. Notify Wound Healing Center of regression in wound condition at [phone]. [name], [name] ([id number]) o Please direct any NON-WOUND related issues/requests for orders to patient's Primary Care Physician Electronic Signature(s) Signed: 12/05/2015 4:35:45 PM By: [name] MD,  FACS Signed: 12/05/2015 5:35:02 PM By: [name] Entered By: [name] on 12/05/2015 14:19:54 [name] ([id number]) -------------------------------------------------------------------------------- Problem List Details Patient Name: [name] Date of Service: 12/05/2015 2:15 PM Medical Record Number: [id number] Patient Account Number: [id number] Date of Birth/Sex: 02/09/1981 (35 y.o. 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 08/30/2015 Yes of other part of foot I70.261 Atherosclerosis of native arteries of extremities with 08/30/2015 Yes gangrene, right leg L89.622 Pressure ulcer of left heel, stage 2 08/30/2015 Yes L02.611 Cutaneous abscess of right foot 10/01/2015 Yes Z89.511 Acquired absence of right leg below knee 11/29/2015 Yes Inactive Problems Resolved Problems Electronic Signature(s) Signed: 12/05/2015 2:27:54 PM By: [name] MD, FACS Entered By: [name] on 12/05/2015 14:27:54 [name]  ([id number]) -------------------------------------------------------------------------------- Progress Note Details Patient Name: [name] Date of Service: 12/05/2015 2:15 PM Medical Record Number: [id number] Patient Account Number: [id number] Date of Birth/Sex: [birth year] (35 y.o. Female) Treating RN: [name] Primary Care Physician: [name] Other Clinician: Referring Physician: [name] Treating Physician/Extender: Rudene Re in Treatment: 13 Subjective Chief Complaint Information obtained from Patient Patient in today for treatment of non-healing wound and HBO Treatment. she has just gotten out of hospital this week and is back to resume her hyperbaric oxygen therapy History of Present Illness (HPI) The following HPI elements were documented for the patient's wound: Location: dry gangrene both feet and heels Quality: Patient reports No Pain. Severity: Patient states wound are getting worse. Duration: Patient has had the wound for > 73months prior to seeking treatment at the wound center Context: The wound appeared gradually over time Modifying Factors: she has been in and out of hospital over the last 2 months Associated Signs and Symptoms: Patient reports having difficulty standing for long periods. Renelda Kilian is a 35 y.o. female who presents to our wound center, back in June 2016, referred by her PCP Dr. Zada Finders for nonhealing ulcers on the lateral aspect of the right heel. Of note she has a history of type 1 diabetes mellitus that has been uncontrolled. Past medical history significant for type 1 diabetes mellitus not controlled, ankylosing spondylitis, anorexia nervosa, irritable bowel syndrome, chronic kidney disease, chronic diarrhea. she then developed gangrene of both feet due to severe peripheral vascular disease and also had gangrene of the tips of her fingers due to upper extremity vascular disease. She was being worked up  by vascular surgery at [facility] and at [facility] and has had several procedures done there. She started with hyperbaric oxygen therapy and had a total of 40 treatments the last one being on 06/20/2015. After the initial treatment of hyperbaric oxygen therapy she started having ear problems and had ultimately to use myringotomy tubes and this was done bilaterally. Since then her ears have been doing fine. In late September, she had seen vascular and hand surgeons. since then she's been in [place] at the rec center for surgery involving extensive vascular procedures for the upper extremities. She was then at [facility] with GI bleeds both upper and lower and has been in and out of hospital for that. She has recently been out of hospital for the last  week. 09/09/2015 -- she was unable to get here in time to start her hyperbaric oxygen today and hence is only here for a wound care visit. 09/19/2015 -- she has been having vancomycin during her dialysis and continues to have vascular appointments and the procedure is been set for early January. She has been unable to make it for her hemodialysis due to various medical symptoms. 09/30/2014 -- her vancomycin was stopped on 09/25/2015 and the mother has noticed the right foot has [name], [name] ([id number]) started draining for the last 3 days. Addendum: after examining the patient I was able to talk to her primary vascular surgeon Dr. Pernell Dupre at the [facility]. I told him about the necrotic area on the plantar aspect of right foot which is now wet gangrene and he agreed with me that he would admit her at [facility] under her care and synchronize further treatment. We have discussed her poor prognosis and he and I discussed the need for hospice care and for sitting down and talking to the patient and her mother and giving them a proper detail of the prognosis. 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 She was worked up with MRIs, abdomen aortogram and placement of left-sided angioplasties were done. After a prolonged hospital she she was discharged home and was told to wear shrinker sock and stump protector and see her surgeon for further instructions regarding wound care and suture removal. Asked to take long-term doxycycline. Objective Constitutional Pulse regular. Respirations normal and unlabored. Afebrile. Vitals Time Taken: 2:01 PM, Height: 68 in, Weight: 96 lbs, BMI: 14.6, Pulse: 77 bpm, Respiratory Rate: 18 breaths/min, Blood Pressure: 117/71 mmHg. Eyes Nonicteric. Reactive to light. Ears, Nose, Mouth, and Throat Lips, teeth, and gums WNL.Marland Kitchen Moist mucosa without lesions. Neck supple and nontender. No palpable supraclavicular or cervical adenopathy. Normal sized without goiter. Respiratory WNL. No retractions.. Cardiovascular Pedal Pulses WNL. No clubbing, cyanosis or edema. Lymphatic No adneopathy. No adenopathy. No adenopathy. Musculoskeletal [name], [name] ([id number]) Adexa without tenderness or enlargement.. Digits and nails w/o clubbing, cyanosis, infection, petechiae, ischemia, or inflammatory conditions.Marland Kitchen Psychiatric Judgement and insight Intact.. No evidence of depression, anxiety, or agitation.. General Notes: the left forefoot has dry gangrene of the first and second toe and the plantar aspect of her forefoot and almost the entire heel. Was no eschar separating today and no dissection to be done. There is a minimal weight area at the base of the big toe and in the web space of the first and second toe.  No evidence of wet gangrene. Integumentary (Hair, Skin) No suspicious lesions. No crepitus or fluctuance. No peri-wound warmth or erythema. No masses.. Wound #12 status is Open. Original cause of wound was Gradually Appeared. The wound is located on the Left,Plantar,Circumferential Foot. The wound measures 9.3cm length x 9cm width x 0.2cm depth; 65.738cm^2 area and 13.148cm^3 volume. The wound is limited to skin breakdown. There is no tunneling or undermining noted. There is a large amount of serous drainage noted. The wound margin is indistinct and nonvisible. There is small (1-33%) red granulation within the wound bed. There is a large (67-100%) amount of necrotic  tissue within the wound bed including Eschar and Adherent Slough. The periwound skin appearance exhibited: Localized Edema, Moist. The periwound skin appearance did not exhibit: Callus, Crepitus, Excoriation, Fluctuance, Friable, Induration, Rash, Scarring, Dry/Scaly, Maceration, Atrophie Blanche, Cyanosis, Ecchymosis, Hemosiderin Staining, Mottled, Pallor, Rubor, Erythema. Periwound temperature was noted as No Abnormality. Wound #9 status is Open. Original cause of wound was Pressure Injury. The wound is located on the Left Calcaneous. The wound measures 4.2cm length x 6.1cm width x 0.1cm depth; 20.122cm^2 area and 2.012cm^3 volume. The wound is limited to skin breakdown. There is no tunneling or undermining noted. There is a small amount of serous drainage noted. The wound margin is distinct with the outline attached to the wound base. There is no granulation within the wound bed. There is a large (67-100%) amount of necrotic tissue within the wound bed including Eschar and Adherent Slough. The periwound skin appearance exhibited: Moist. The periwound skin appearance did not exhibit: Callus, Crepitus, Excoriation, Fluctuance, Friable, Induration, Localized Edema, Rash, Scarring, Dry/Scaly, Maceration, Atrophie Blanche, Cyanosis,  Ecchymosis, Hemosiderin Staining, Mottled, Pallor, Rubor, Erythema. Periwound temperature was noted as No Abnormality. The periwound has tenderness on palpation. Assessment Active Problems ICD-10 E10.621 - Type 1 diabetes mellitus with foot ulcer E10.52 - Type 1 diabetes mellitus with diabetic peripheral angiopathy with gangrene I70.245 - Atherosclerosis of native arteries of left leg with ulceration of other part of foot I70.261 - Atherosclerosis of native arteries of extremities with gangrene, right leg Z61.096 - Pressure ulcer of left heel, stage 2 [name], [name] ([id number]) L02.611 - Cutaneous abscess of right foot Z89.511 - Acquired absence of right leg below knee This is the brightest of seeing Deanne in a long time and at this stage considering she has a Wagner stage IV DFU, have again discussed the possibility of hyperbaric oxygen therapy to be given 5 days a week for about 30 settings to help her overall prognosis as far as wound healing goes. She and her mother have read all questions answered and will consider it when there is an opening in her schedule. Recommended application of Betadine paint to the dry eschar and in areas where debridement was done today and subcutaneous tissues visible have recommended Aquacel Ag. Discussed in great detail the signs of sepsis and wet gangrene and have told them to be vigilant about this. Due to her various appointments she will be back to see as soon as next week if possible Plan Wound Cleansing: Wound #12 Left,Plantar,Circumferential Foot: Cleanse wound with mild soap and water Wound #9 Left Calcaneous: Cleanse wound with mild soap and water Primary Wound Dressing: Wound #12 Left,Plantar,Circumferential Foot: Aquacel Ag - to wet areas Other: - betadine paint to dry areas Wound #9 Left Calcaneous: Aquacel Ag - to wet areas Other: - betadine paint to dry areas Secondary Dressing: Wound #12 Left,Plantar,Circumferential  Foot: Gauze and Kerlix/Conform Wound #9 Left Calcaneous: Gauze and Kerlix/Conform Dressing Change Frequency: Wound #12 Left,Plantar,Circumferential Foot: Change dressing every day. Wound #9 Left Calcaneous: Change dressing every day. Follow-up Appointments: Wound #12 Left,Plantar,Circumferential Foot: Other: - as patient is able Wound #9 Left Calcaneous: Other: - as patient is able [name], [name] ([id number]) Off-Loading: Wound #12 Left,Plantar,Circumferential Foot: Open toe surgical shoe with peg assist. Wound #9 Left Calcaneous: Open toe surgical shoe with peg assist. Home Health: Wound #12 Left,Plantar,Circumferential Foot: Continue Home Health Visits - [facility] Home Health Nurse may visit PRN to address patient s wound care needs. FACE TO FACE ENCOUNTER: MEDICARE and MEDICAID PATIENTS: I  certify that this patient is under my care and that I had a face-to-face encounter that meets the physician face-to-face encounter requirements with this patient on this date. The encounter with the patient was in whole or in part for the following MEDICAL CONDITION: (primary reason for Home Healthcare) MEDICAL NECESSITY: I certify, that based on my findings, NURSING services are a medically necessary home health service. HOME BOUND STATUS: I certify that my clinical findings support that this patient is homebound (i.e., Due to illness or injury, pt requires aid of supportive devices such as crutches, cane, wheelchairs, walkers, the use of special transportation or the assistance of another person to leave their place of residence. There is a normal inability to leave the home and doing so requires considerable and taxing effort. Other absences are for medical reasons / religious services and are infrequent or of short duration when for other reasons). If current dressing causes regression in wound condition, may D/C ordered dressing product/s and apply Normal Saline Moist Dressing daily  until next Wound Healing Center / Other MD appointment. Notify Wound Healing Center of regression in wound condition at [phone]. Please direct any NON-WOUND related issues/requests for orders to patient's Primary Care Physician Wound #9 Left Calcaneous: Continue Home Health Visits - [facility] Nurse may visit [facility] to address patient s wound care needs. FACE TO FACE ENCOUNTER: MEDICARE and MEDICAID PATIENTS: I certify that this patient is under my care and that I had a face-to-face encounter that meets the physician face-to-face encounter requirements with this patient on this date. The encounter with the patient was in whole or in part for the following MEDICAL CONDITION: (primary reason for Home Healthcare) MEDICAL NECESSITY: I certify, that based on my findings, NURSING services are a medically necessary home health service. HOME BOUND STATUS: I certify that my clinical findings support that this patient is homebound (i.e., Due to illness or injury, pt requires aid of supportive devices such as crutches, cane, wheelchairs, walkers, the use of special transportation or the assistance of another person to leave their place of residence. There is a normal inability to leave the home and doing so requires considerable and taxing effort. Other absences are for medical reasons / religious services and are infrequent or of short duration when for other reasons). If current dressing causes regression in wound condition, may D/C ordered dressing product/s and apply Normal Saline Moist Dressing daily until next Wound Healing Center / Other MD appointment. Notify Wound Healing Center of regression in wound condition at [phone]. Please direct any NON-WOUND related issues/requests for orders to patient's Primary Care Physician This is the brightest of seeing Jaimy in a long time and at this stage considering she has a Educational psychologist stage IV DFU, have again discussed the possibility of  hyperbaric oxygen therapy to be given 5 days a week for about 30 settings to help her overall prognosis as far as wound healing goes. She and her mother have read all questions answered and will consider it when there is an opening in her schedule. [name], [name] ([id number]) Recommended application of Betadine paint to the dry eschar and in areas where debridement was done today and subcutaneous tissues visible have recommended Aquacel Ag. Discussed in great detail the signs of sepsis and wet gangrene and have told them to be vigilant about this. Due to her various appointments she will be back to see as soon as next week if possible Electronic Signature(s) Signed: 12/05/2015 2:31:23 PM By: [name] MD,  FACS Entered By: [name] on 12/05/2015 14:31:22 [name] ([id number]) -------------------------------------------------------------------------------- SuperBill Details Patient Name: [name] Date of Service: 12/05/2015 Medical Record Number: [id number] Patient Account Number: [id number] Date of Birth/Sex: 05-23-1981 (35 y.o. 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 of native arteries of extremities with gangrene, right leg L89.622 Pressure ulcer of left heel, stage 2 L02.611 Cutaneous abscess of right foot Z89.511 Acquired absence of right leg below knee Facility Procedures CPT4 Code: 08657846 Description: 99213 - WOUND CARE VISIT-LEV 3 EST PT Modifier: Quantity: 1 Physician Procedures CPT4:  Description Modifier Quantity Code [id number] 99213 - WC PHYS LEVEL 3 - EST PT 1 ICD-10 Description Diagnosis E10.621 Type 1 diabetes mellitus with foot ulcer L89.622 Pressure ulcer of left heel, stage 2 I70.245 Atherosclerosis of native arteries of  left leg with ulceration of other part of foot Electronic Signature(s) Signed: 12/05/2015 2:31:41 PM By: [name] MD, FACS Entered By: [name] on 12/05/2015 14:31:40

## 2015-12-06 NOTE — Progress Notes (Signed)
ESHANI, MAESTRE ([id number]) Visit Report for 12/05/2015 Arrival Information Details Patient Name: [name], [name] Date of Service: 12/05/2015 2:15 PM Medical Record Number: [id number] Patient Account Number: [id number] Date of Birth/Sex: 1981-08-06 (35 y.o. Female) Treating RN: [name] Primary Care Physician: [name] Other Clinician: Referring Physician: [name] Treating Physician/Extender: Rudene Re in Treatment: 13 Visit Information History Since Last Visit Added or deleted any medications: No Patient Arrived: Wheel Chair Any new allergies or adverse reactions: No Arrival Time: 14:00 Had a fall or experienced change in No activities of daily living that may affect Accompanied By: mom risk of falls: Transfer Assistance: Manual Signs or symptoms of abuse/neglect since last No Patient Identification Verified: Yes visito Secondary Verification Process Yes Hospitalized since last visit: No Completed: Pain Present Now: No Patient Requires Transmission-Based No Precautions: Patient Has Alerts: Yes Electronic Signature(s) Signed: 12/05/2015 5:35:02 PM By: [name] Entered By: [name] on 12/05/2015 14:00:25 [name] ([id number]) -------------------------------------------------------------------------------- Clinic Level of Care Assessment Details Patient Name: [name] Date of Service: 12/05/2015 2:15 PM Medical Record Number: [id number] Patient Account Number: [id number] Date of Birth/Sex: [birth year] (35 y.o. Female) Treating RN: [name] Primary Care Physician: [name] Other Clinician: Referring Physician: [name] Treating Physician/Extender: Rudene Re in Treatment: 13 Clinic Level of Care Assessment Items TOOL 4 Quantity Score  - Use when only an EandM is performed on FOLLOW-UP visit 0 ASSESSMENTS - Nursing Assessment / Reassessment X - Reassessment of Co-morbidities  (includes updates in patient status) 1 10 X - Reassessment of Adherence to Treatment Plan 1 5 ASSESSMENTS - Wound and Skin Assessment / Reassessment  - Simple Wound Assessment / Reassessment - one wound 0 X - Complex Wound Assessment / Reassessment - multiple wounds 2 5  - Dermatologic / Skin Assessment (not related to wound area) 0 ASSESSMENTS - Focused Assessment  - Circumferential Edema Measurements - multi extremities 0  - Nutritional Assessment / Counseling / Intervention 0 X - Lower Extremity Assessment (monofilament, tuning fork, pulses) 1 5  - Peripheral Arterial Disease Assessment (using hand held doppler) 0 ASSESSMENTS - Ostomy and/or Continence Assessment and Care  - Incontinence Assessment and Management 0  - Ostomy Care Assessment and Management (repouching, etc.) 0 PROCESS - Coordination of Care X - Simple Patient / Family Education for ongoing care 1 15  - Complex (extensive) Patient / Family Education for ongoing care 0  - Staff obtains Chiropractor, Records, Test Results / Process Orders 0  - Staff telephones HHA, Nursing Homes / Clarify orders / etc 0  - Routine Transfer to another Facility (non-emergent condition) 0 Kertesz, Tateanna N. ([id number])  - Routine Hospital Admission (non-emergent condition) 0  - New Admissions / Manufacturing engineer / Ordering NPWT, Apligraf, etc. 0  - Emergency Hospital Admission (emergent condition) 0 X - Simple Discharge Coordination 1 10  - Complex (extensive) Discharge Coordination 0 PROCESS - Special Needs  - Pediatric / Minor Patient Management 0  - Isolation Patient Management 0  - Hearing / Language / Visual special needs 0  - Assessment of Community assistance (transportation, D/C planning, etc.) 0  - Additional assistance / Altered mentation 0  - Support Surface(s) Assessment (bed, cushion, seat, etc.) 0 INTERVENTIONS - Wound Cleansing / Measurement  - Simple Wound Cleansing - one  wound 0 X - Complex Wound Cleansing - multiple wounds 2 5 X - Wound Imaging (photographs - any number of wounds) 1 5  - Wound Tracing (instead of photographs) 0  -  Simple Wound Measurement - one wound 0 X - Complex Wound Measurement - multiple wounds 2 5 INTERVENTIONS - Wound Dressings X - Small Wound Dressing one or multiple wounds 2 10 []  - Medium Wound Dressing one or multiple wounds 0 []  - Large Wound Dressing one or multiple wounds 0 []  - Application of Medications - topical 0 []  - Application of Medications - injection 0 INTERVENTIONS - Miscellaneous []  - External ear exam 0 Montelongo, Irelyn N. ( ) []  - Specimen Collection (cultures, biopsies, blood, body fluids, etc.) 0 []  - Specimen(s) / Culture(s) sent or taken to Lab for analysis 0 []  - Patient Transfer (multiple staff / Lift / Similar devices) 0 []  - Simple Staple / Suture removal (25 or less) 0 []  - Complex Staple / Suture removal (26 or more) 0 []  - Hypo / Hyperglycemic Management (close monitor of Blood Glucose) 0 []  - Ankle / Brachial Index (ABI) - do not check if billed separately 0 X - Vital Signs 1 5 Has the patient been seen at the hospital within the last three years: Yes Total Score: 105 Level Of Care: New/Established - Level 3 Electronic Signature(s) Signed: 12/05/2015 5:35:02 PM By: Entered By: on 12/05/2015 14:20:52 ( ) -------------------------------------------------------------------------------- Encounter Discharge Information Details Patient Name: Date of Service: 12/05/2015 2:15 PM Medical Record Number: Patient Account Number: Date of Birth/Sex: 03/12/1981 (35 y.o. Female) Treating RN: 02/04/2016 Primary Care Physician: [name] Other Clinician: Referring Physician: [name] Treating Physician/Extender: 02/04/2016 in Treatment: 30 Encounter Discharge  Information Items Discharge Pain Level: 0 Discharge Condition: Stable Ambulatory Status: Wheelchair Discharge Destination: Home Private Transportation: Auto Accompanied By: mom Schedule Follow-up Appointment: Yes Medication Reconciliation completed and No provided to Patient/Care Dejour Vos: Clinical Summary of Care: Electronic Signature(s) Signed: 12/05/2015 4:28:25 PM By: [name] Entered By: 02/04/2016 on 12/05/2015 16:28:25 [id number] (09/09/1981) -------------------------------------------------------------------------------- Multi Wound Chart Details Patient Name: 04-29-1976 Date of Service: 12/05/2015 2:15 PM Medical Record Number: [name] Patient Account Number: [name] Date of Birth/Sex: 12-10-1980 (35 y.o. 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] [9:the Lower Extremity] Comorbid History: [12:Anemia, Type I Diabetes, Anemia, Type I Diabetes, N/A End Stage Renal Disease, End Stage Renal Disease, Rheumatoid Arthritis, Neuropathy] [9:Rheumatoid Arthritis, Neuropathy] Date Acquired: [12:07/28/2015] [9:07/28/2015] [N/A:N/A] Weeks of Treatment: [12:13] [9:13] [N/A:N/A] Wound Status: [12:Open] [9:Open] [N/A:N/A] Pending Amputation on Yes [9:No] [N/A:N/A] Presentation: Measurements L x W x D 9.3x9x0.2 [9:4.2x6.1x0.1]  [N/A:N/A] (cm) Area (cm) : [12:65.738] [9:20.122] [N/A:N/A] Volume (cm) : [12:13.148] [9:2.012] [N/A:N/A] % Reduction in Area: [12:-52.20%] [9:-8.30%] [N/A:N/A] % Reduction in Volume: -204.40% [9:-8.30%] [N/A:N/A] Classification: [12:Grade 2] [9:Grade 2] [N/A:N/A] Exudate Amount: [12:Large] [9:Small] [N/A:N/A] Exudate Type: [12:Serous] [9:Serous] [N/A:N/A] Exudate Color: [12:amber] [9:amber] [N/A:N/A] Foul Odor After [12:Yes] [9:Yes] [N/A:N/A] Cleansing: Odor Anticipated Due to No [9:No] [N/A:N/A] Product Use: Wound Margin: [12:Indistinct, nonvisible] [9:Distinct, outline attached N/A] Granulation Amount: Small (1-33%) None Present (0%) N/A Granulation Quality: Red N/A N/A Necrotic Amount: Large (67-100%) Large (67-100%) N/A Necrotic Tissue: Eschar, Adherent Slough  Eschar, Adherent Slough N/A Exposed Structures: Fascia: No Fascia: No N/A Fat: No Fat: No Tendon: No Tendon: No Muscle: No Muscle: No Joint: No Joint: No Bone: No Bone: No Limited to Skin Limited to Skin Breakdown Breakdown Epithelialization: None None N/A Periwound Skin Texture: Edema: Yes Edema: No N/A Excoriation: No Excoriation: No Induration: No Induration: No Callus: No Callus: No Crepitus: No Crepitus: No Fluctuance: No Fluctuance: No Friable: No Friable: No Rash: No Rash: No Scarring: No Scarring: No Periwound Skin Moist: Yes Moist: Yes N/A Moisture: Maceration: No Maceration: No Dry/Scaly: No Dry/Scaly: No Periwound Skin Color: Atrophie Blanche: No Atrophie Blanche: No N/A Cyanosis: No Cyanosis: No Ecchymosis: No Ecchymosis: No Erythema: No Erythema: No Hemosiderin Staining: No Hemosiderin Staining: No Mottled: No Mottled: No Pallor: No Pallor: No Rubor: No Rubor: No Temperature: No Abnormality No Abnormality N/A Tenderness on No Yes N/A Palpation: Wound Preparation: Ulcer Cleansing: Ulcer Cleansing: N/A Rinsed/Irrigated with Rinsed/Irrigated with Saline  Saline Topical Anesthetic Topical Anesthetic Applied: None Applied: None Treatment Notes Electronic Signature(s) Signed: 12/05/2015 5:35:02 PM By: [name] Entered By: [name] on 12/05/2015 14:19:04 [name] ([id number] Craft, Ledell Peoples ([id number]) -------------------------------------------------------------------------------- Multi-Disciplinary Care Plan Details Patient Name: [name] Date of Service: 12/05/2015 2:15 PM Medical Record Number: [id number] Patient Account Number: [id number] Date of Birth/Sex: Jan 12, 1981 (35 y.o. Female) Treating RN: [name] Primary Care Physician: [name] Other Clinician: Referring Physician: [name] Treating Physician/Extender: Rudene Re in Treatment: 13 Active Inactive HBO Nursing Diagnoses: Anxiety related to feelings of confinement associated with the hyperbaric oxygen chamber Anxiety related to knowledge deficit of hyperbaric oxygen therapy and treatment procedures Discomfort related to temperature and humidity changes inside hyperbaric chamber Potential for barotraumas to ears, sinuses, teeth, and lungs or cerebral gas embolism related to changes in atmospheric pressure inside hyperbaric oxygen chamber Potential for oxygen toxicity seizures related to delivery of 100% oxygen at an increased atmospheric pressure Potential for pulmonary oxygen toxicity related to delivery of 100% oxygen at an increased atmospheric pressure Goals: Barotrauma will be prevented during HBO2 Date Initiated: 08/30/2015 Goal Status: Active Patient and/or family will be able to state/discuss factors appropriate to the management of their disease process during treatment Date Initiated: 08/30/2015 Goal Status: Active Patient will tolerate the hyperbaric oxygen therapy treatment Date Initiated: 08/30/2015 Goal Status: Active Patient will tolerate the internal climate of the chamber Date Initiated:  08/30/2015 Goal Status: Active Patient/caregiver will verbalize understanding of HBO goals, rationale, procedures and potential hazards Date Initiated: 08/30/2015 Goal Status: Active Signs and symptoms of pulmonary oxygen toxicity will be recognized and promptly addressed Date Initiated: 08/30/2015 Goal Status: Active Signs and symptoms of seizure will be recognized and promptly addressed ; seizing patients will suffer no harm Date Initiated: 08/30/2015 [name] ([id number]) Goal Status: Active Interventions: Administer a five (5) minute air break for patient if signs and symptoms of seizure appear and notify the hyperbaric physician Administer a ten (10) minute air break for patient if signs and symptoms of seizure appear and notify the hyperbaric physician Administer decongestants, per physician orders, prior to HBO2 Administer the correct therapeutic gas delivery based on the patients needs and limitations, per physician order Assess and provide for patientos comfort related to the hyperbaric environment and equalization of middle ear Assess for signs and symptoms related to adverse events, including but not limited to confinement anxiety, pneumothorax, oxygen toxicity and baurotrauma Assess patient for any history of confinement anxiety Assess patient's knowledge and expectations regarding hyperbaric medicine  and provide education related to the hyperbaric environment, goals of treatment and prevention of adverse events Implement protocols to decrease risk of pneumothorax in high risk patients Notes: Abuse / Safety / Falls / Self Care Management Nursing Diagnoses: Potential for falls Goals: Patient will remain injury free Date Initiated: 09/19/2015 Goal Status: Active Patient/caregiver will verbalize/demonstrate measures taken to prevent injury and/or falls Date Initiated: 09/19/2015 Goal Status: Active Interventions: Assess fall risk on admission and as  needed Assess impairment of mobility on admission and as needed per policy Notes: Orientation to the Wound Care Program Nursing Diagnoses: Knowledge deficit related to the wound healing center program Goals: Patient/caregiver will verbalize understanding of the Wound Healing Center Program [place] ([id number]) Date Initiated: 08/30/2015 Goal Status: Active Interventions: Provide education on orientation to the wound center Notes: Pressure Nursing Diagnoses: Knowledge deficit related to causes and risk factors for pressure ulcer development Knowledge deficit related to management of pressures ulcers Potential for impaired tissue integrity related to pressure, friction, moisture, and shear Goals: Patient will remain free from development of additional pressure ulcers Date Initiated: 08/30/2015 Goal Status: Active Patient will remain free of pressure ulcers Date Initiated: 08/30/2015 Goal Status: Active Patient/caregiver will verbalize risk factors for pressure ulcer development Date Initiated: 08/30/2015 Goal Status: Active Patient/caregiver will verbalize understanding of pressure ulcer management Date Initiated: 08/30/2015 Goal Status: Active Interventions: Assess: immobility, friction, shearing, incontinence upon admission and as needed Assess offloading mechanisms upon admission and as needed Assess potential for pressure ulcer upon admission and as needed Provide education on pressure ulcers Treatment Activities: Patient referred for home evaluation of offloading devices/mattresses : 12/05/2015 Patient referred for pressure reduction/relief devices : 12/05/2015 Pressure reduction/relief device ordered : 12/05/2015 Notes: Wound/Skin Impairment Nursing Diagnoses: Impaired tissue integrity Lemme, Jessyka N. ([id number]) Knowledge deficit related to ulceration/compromised skin integrity Goals: Patient will have a decrease in wound volume by X% from date: (specify  in notes) Date Initiated: 08/30/2015 Goal Status: Active Patient/caregiver will verbalize understanding of skin care regimen Date Initiated: 08/30/2015 Goal Status: Active Ulcer/skin breakdown will have a volume reduction of 30% by week 4 Date Initiated: 08/30/2015 Goal Status: Active Ulcer/skin breakdown will have a volume reduction of 50% by week 8 Date Initiated: 08/30/2015 Goal Status: Active Ulcer/skin breakdown will have a volume reduction of 80% by week 12 Date Initiated: 08/30/2015 Goal Status: Active Ulcer/skin breakdown will heal within 14 weeks Date Initiated: 08/30/2015 Goal Status: Active Interventions: Assess patient/caregiver ability to obtain necessary supplies Assess patient/caregiver ability to perform ulcer/skin care regimen upon admission and as needed Assess ulceration(s) every visit Provide education on ulcer and skin care Notes: Electronic Signature(s) Signed: 12/05/2015 5:35:02 PM By: [name] Entered By: [name] on 12/05/2015 14:18:53 [name] ([id number]) -------------------------------------------------------------------------------- Patient/Caregiver Education Details Patient Name: [name] Date of Service: 12/05/2015 2:15 PM Medical Record Number: [id number] Patient Account Number: [id number] Date of Birth/Gender: 28-Feb-1981 (35 y.o. Female) Treating RN: [name] Primary Care Physician: [name] Other Clinician: Referring Physician: [name] Treating Physician/Extender: Rudene Re in Treatment: 13 Education Assessment Education Provided To: Patient and Caregiver Education Topics Provided Pressure: Handouts: Preventing Pressure Ulcers Methods: Explain/Verbal Responses: State content correctly Electronic Signature(s) Signed: 12/05/2015 4:28:38 PM By: [name] Entered By: [name] on 12/05/2015 16:28:38 [name]  ([id number]) -------------------------------------------------------------------------------- Wound Assessment Details Patient Name: [name] Date of Service: 12/05/2015 2:15 PM Medical Record Number: [id number] Patient Account Number: [id number] Date of Birth/Sex: Mar 18, 1981 (35 y.o. Female) Treating RN: [name]  Primary Care Physician: [name] Other Clinician: Referring Physician: [name] Treating Physician/Extender: Rudene Re in Treatment: 13 Wound Status Wound Number: 12 Primary Diabetic Wound/Ulcer of the Lower Etiology: Extremity Wound Location: Left Foot - Plantar, Circumfernential Wound Open Status: Wounding Event: Gradually Appeared Comorbid Anemia, Type I Diabetes, End Stage Date Acquired: 07/28/2015 History: Renal Disease, Rheumatoid Arthritis, Weeks Of Treatment: 13 Neuropathy Clustered Wound: No Pending Amputation On Presentation Photos Photo Uploaded By: [name] on 12/05/2015 17:37:06 Wound Measurements Length: (cm) 9.3 Width: (cm) 9 Depth: (cm) 0.2 Area: (cm) 65.738 Volume: (cm) 13.148 % Reduction in Area: -52.2% % Reduction in Volume: -204.4% Epithelialization: None Tunneling: No Undermining: No Wound Description Classification: Grade 2 Foul Odor After Wound Margin: Indistinct, nonvisible Due to Product Exudate Amount: Large Exudate Type: Serous Exudate Color: amber Cleansing: Yes Use: No Wound Bed Granulation Amount: Small (1-33%) Exposed Structure Granulation Quality: Red Fascia Exposed: No Babson, Mayleigh N. ([id number]) Necrotic Amount: Large (67-100%) Fat Layer Exposed: No Necrotic Quality: Eschar, Adherent Slough Tendon Exposed: No Muscle Exposed: No Joint Exposed: No Bone Exposed: No Limited to Skin Breakdown Periwound Skin Texture Texture Color No Abnormalities Noted: No No Abnormalities Noted: No Callus: No Atrophie Blanche: No Crepitus: No Cyanosis: No Excoriation:  No Ecchymosis: No Fluctuance: No Erythema: No Friable: No Hemosiderin Staining: No Induration: No Mottled: No Localized Edema: Yes Pallor: No Rash: No Rubor: No Scarring: No Temperature / Pain Moisture Temperature: No Abnormality No Abnormalities Noted: No Dry / Scaly: No Maceration: No Moist: Yes Wound Preparation Ulcer Cleansing: Rinsed/Irrigated with Saline Topical Anesthetic Applied: None Treatment Notes Wound #12 (Left, Plantar, Circumferential Foot) 1. Cleansed with: Clean wound with Normal Saline 4. Dressing Applied: Aquacel Ag Other dressing (specify in notes) 5. Secondary Dressing Applied Guaze, ABD and kerlix/Conform 7. Secured with Tape Notes betadine paint Electronic Signature(s) Signed: 12/05/2015 5:35:02 PM By: [name] Entered By: [name] on 12/05/2015 14:17:40 [name] ([id number]) Simeon Craft, Ledell Peoples ([id number]) -------------------------------------------------------------------------------- Wound Assessment Details Patient Name: [name] Date of Service: 12/05/2015 2:15 PM Medical Record Number: [id number] Patient Account Number: [id number] Date of Birth/Sex: [birth year] (35 y.o. Female) Treating RN: [name] Primary Care Physician: [name] Other Clinician: Referring Physician: [name] Treating Physician/Extender: Rudene Re in Treatment: 13 Wound Status Wound Number: 9 Primary Diabetic Wound/Ulcer of the Lower Etiology: Extremity Wound Location: Left Calcaneous Wound Open Wounding Event: Pressure Injury Status: Date Acquired: 07/28/2015 Comorbid Anemia, Type I Diabetes, End Stage Weeks Of Treatment: 13 History: Renal Disease, Rheumatoid Arthritis, Clustered Wound: No Neuropathy Photos Photo Uploaded By: [name] on 12/05/2015 17:37:50 Wound Measurements Length: (cm) 4.2 Width: (cm) 6.1 Depth: (cm) 0.1 Area: (cm) 20.122 Volume: (cm) 2.012 % Reduction in  Area: -8.3% % Reduction in Volume: -8.3% Epithelialization: None Tunneling: No Undermining: No Wound Description Classification: Grade 2 Foul Odor Afte Wound Margin: Distinct, outline attached Due to Product Exudate Amount: Small Exudate Type: Serous Exudate Color: amber r Cleansing: Yes Use: No Wound Bed Granulation Amount: None Present (0%) Exposed Structure Necrotic Amount: Large (67-100%) Fascia Exposed: No Necrotic Quality: Eschar, Adherent Slough Fat Layer Exposed: No Tendon Exposed: No Soderberg, Brionna N. ([id number]) Muscle Exposed: No Joint Exposed: No Bone Exposed: No Limited to Skin Breakdown Periwound Skin Texture Texture Color No Abnormalities Noted: No No Abnormalities Noted: No Callus: No Atrophie Blanche: No Crepitus: No Cyanosis: No Excoriation: No Ecchymosis: No Fluctuance: No Erythema: No Friable: No Hemosiderin Staining: No Induration: No Mottled: No Localized Edema: No Pallor: No Rash: No  Rubor: No Scarring: No Temperature / Pain Moisture Temperature: No Abnormality No Abnormalities Noted: No Tenderness on Palpation: Yes Dry / Scaly: No Maceration: No Moist: Yes Wound Preparation Ulcer Cleansing: Rinsed/Irrigated with Saline Topical Anesthetic Applied: None Treatment Notes Wound #9 (Left Calcaneous) 1. Cleansed with: Clean wound with Normal Saline 4. Dressing Applied: Aquacel Ag Other dressing (specify in notes) 5. Secondary Dressing Applied Guaze, ABD and kerlix/Conform 7. Secured with Tape Notes betadine paint Electronic Signature(s) Signed: 12/05/2015 5:35:02 PM By: [name] Entered By: [name] on 12/05/2015 14:18:31 [name] ([id number]) -------------------------------------------------------------------------------- Vitals Details Patient Name: [name] Date of Service: 12/05/2015 2:15 PM Medical Record Number: [id number] Patient Account Number: [id number] Date of Birth/Sex:  29-Aug-1981 (35 y.o. Female) Treating RN: [name] Primary Care Physician: [name] Other Clinician: Referring Physician: [name] Treating Physician/Extender: Rudene Re in Treatment: 13 Vital Signs Time Taken: 14:01 Pulse (bpm): 77 Height (in): 68 Respiratory Rate (breaths/min): 18 Weight (lbs): 96 Blood Pressure (mmHg): 117/71 Body Mass Index (BMI): 14.6 Reference Range: 80 - 120 mg / dl Electronic Signature(s) Signed: 12/05/2015 5:35:02 PM By: [name] Entered By: [name] on 12/05/2015 14:01:26

## 2015-12-12 ENCOUNTER — Ambulatory Visit: Payer: Medicare Other | Admitting: Surgery

## 2015-12-16 ENCOUNTER — Ambulatory Visit: Payer: Medicare Other | Admitting: Surgery

## 2015-12-19 ENCOUNTER — Ambulatory Visit: Payer: Medicare Other | Admitting: Surgery

## 2015-12-20 ENCOUNTER — Encounter: Payer: Self-pay | Admitting: General Surgery

## 2015-12-20 ENCOUNTER — Encounter (HOSPITAL_BASED_OUTPATIENT_CLINIC_OR_DEPARTMENT_OTHER): Payer: Medicare Other | Admitting: General Surgery

## 2015-12-20 DIAGNOSIS — L97421 Non-pressure chronic ulcer of left heel and midfoot limited to breakdown of skin: Secondary | ICD-10-CM

## 2015-12-20 DIAGNOSIS — L97524 Non-pressure chronic ulcer of other part of left foot with necrosis of bone: Secondary | ICD-10-CM | POA: Diagnosis not present

## 2015-12-20 DIAGNOSIS — E10621 Type 1 diabetes mellitus with foot ulcer: Secondary | ICD-10-CM | POA: Diagnosis not present

## 2015-12-20 NOTE — Progress Notes (Signed)
See i heal 

## 2015-12-22 NOTE — Progress Notes (Addendum)
[name], [name] ([id number]) Visit Report for 12/20/2015 Chief Complaint Document Details [name], [name] 12/20/2015 3:00 Patient Name: Date of Service: N. PM Medical Record Patient Account Number: [id number] Number: Treating RN: Phillis Haggis Date of Birth/Sex: 04-Feb-1981 (35 y.o. Female) Other Clinician: Primary Care Physician: Juanita Craver, Denny Lave Referring Physician: [name] Physician/Extender: Tania Ade in Treatment: 16 Information Obtained from: Patient Chief Complaint Patient in today for treatment of non-healing wound and HBO Treatment. she has just gotten out of hospital this week and is back to resume her hyperbaric oxygen therapy Electronic Signature(s) Signed: 12/20/2015 4:03:34 PM By: Ardath Sax MD Entered By: Ardath Sax on 12/20/2015 16:03:34 [name] ([id number]) -------------------------------------------------------------------------------- HPI Details [name], [name] 12/20/2015 3:00 Patient Name: Date of Service: N. PM Medical Record Patient Account Number: [id number] Number: Treating RN: Phillis Haggis Date of Birth/Sex: 1981-08-22 (35 y.o. Female) Other Clinician: Primary Care Physician: Juanita Craver, Zacharia Sowles Referring Physician: [name] Physician/Extender: Tania Ade in Treatment: 16 History of Present Illness Location: dry gangrene both feet and heels Quality: Patient reports No Pain. Severity: Patient states wound are getting worse. Duration: Patient has had the wound for > 83months prior to seeking treatment at the wound center Context: The wound appeared gradually over time Modifying Factors: she has been in and out of hospital over the last 2 months Associated Signs and Symptoms: Patient reports having difficulty standing for long periods. HPI Description: Thania Woodlief is a 35 y.o. female who presents to our wound center, back in June 2016, referred by her  PCP Dr. Zada Finders for nonhealing ulcers on the lateral aspect of the right heel. Of note she has a history of type 1 diabetes mellitus that has been uncontrolled. Past medical history significant for type 1 diabetes mellitus not controlled, ankylosing spondylitis, anorexia nervosa, irritable bowel syndrome, chronic kidney disease, chronic diarrhea. she then developed gangrene of both feet due to severe peripheral vascular disease and also had gangrene of the tips of her fingers due to upper extremity vascular disease. She was being worked up by vascular surgery at [facility] and at [facility] and has had several procedures done there. She started with hyperbaric oxygen therapy and had a total of 40 treatments the last one being on 06/20/2015. After the initial treatment of hyperbaric oxygen therapy she started having ear problems and had ultimately to use myringotomy tubes and this was done bilaterally. Since then her ears have been doing fine. In late September, she had seen vascular and hand surgeons. since then she's been in [place] at the rec center for surgery involving extensive vascular procedures for the upper extremities. She was then at [facility] with GI bleeds both upper and lower and has been in and out of hospital for that. She has recently been out of hospital for the last week. 09/09/2015 -- she was unable to get here in time to start her hyperbaric oxygen today and hence is only here for a wound care visit. 09/19/2015 -- she has been having vancomycin during her dialysis and continues to have vascular appointments and the procedure is been set for early January. She has been unable to make it for her hemodialysis due to various medical symptoms. 09/30/2014 -- her vancomycin was stopped on 09/25/2015 and the mother has noticed the right foot has started draining for the last 3 days. Addendum: after examining the patient I was able to talk to her primary vascular surgeon Dr. Pernell Dupre at  the [facility]. I told him about the  necrotic area on the plantar aspect of right foot which is now wet gangrene and he agreed with me that he would admit her at [facility] under her care and synchronize further treatment. We have discussed her poor prognosis and he and I discussed the need for hospice care and for sitting down and talking to the patient and her mother and giving them a proper detail of the prognosis. 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 She was worked up with MRIs, abdomen aortogram and placement of left-sided angioplasties were done. After a prolonged hospital she she was discharged home and was told to wear shrinker sock and stump protector and see her surgeon for further instructions regarding wound care and suture removal. Asked to take long-term doxycycline. Electronic Signature(s) Signed: 12/20/2015 4:03:44 PM By: Ardath Sax MD Entered By: Ardath Sax on 12/20/2015 16:03:44 [name] ([id number]) -------------------------------------------------------------------------------- Physical Exam Details [name], [name] 12/20/2015 3:00 Patient Name: Date of Service: N. PM Medical Record Patient Account Number: [id number] Number: Treating RN: Phillis Haggis Date of Birth/Sex: [birth year] (35 y.o. Female) Other Clinician: Primary Care Physician: Juanita Craver, Torryn Hudspeth Referring Physician: [name] Physician/Extender: Tania Ade in Treatment: 16 Electronic Signature(s) Signed: 12/20/2015  4:03:50 PM By: Ardath Sax MD Entered By: Ardath Sax on 12/20/2015 16:03:50 [name] ([id number]) -------------------------------------------------------------------------------- Physician Orders Details [name], [name] 12/20/2015 3:00 Patient Name: Date of Service: N. PM Medical Record Patient Account Number: [id number] Number: Treating RN: Phillis Haggis Date of Birth/Sex: July 19, 1981 (34 y.o. Female) Other Clinician: Primary Care Physician: Juanita Craver, Loleta Frommelt Referring Physician: [name] Physician/Extender: Tania Ade in Treatment: 43 Verbal / Phone Orders: Yes Clinician: Ashok Cordia, Debi Read Back and Verified: Yes Diagnosis Coding ICD-10 Coding Code Description E10.621 Type 1 diabetes mellitus with foot ulcer E10.52 Type 1 diabetes mellitus with diabetic peripheral angiopathy with gangrene I70.245 Atherosclerosis of native arteries of left leg with ulceration of other part of foot I70.261 Atherosclerosis of native arteries of extremities with gangrene, right leg L89.622 Pressure ulcer of left heel, stage 2 L02.611 Cutaneous abscess of right foot Z89.511 Acquired absence of right leg below knee Wound Cleansing Wound #12 Left,Plantar,Circumferential Foot o Cleanse wound with mild soap and water Wound #9 Left Calcaneous o Cleanse wound with mild soap and water Primary Wound Dressing Wound #12 Left,Plantar,Circumferential Foot o Aquacel Ag - to wet areas ****PLEASE ORDER THE 10CM / 4IN X 5 IN SQUARES ****** o Other: - betadine STICKS paint to dry areas ****PLEASE ORDER SOME FOR PT***** Wound #9 Left Calcaneous o Aquacel Ag - to wet areas ****PLEASE ORDER THE 10CM / 4IN X 5 IN SQUARES ****** o Other: - betadine STICKS paint to dry areas ****PLEASE ORDER SOME FOR PT***** Secondary Dressing Wound #12 Left,Plantar,Circumferential Foot o ABD pad o Gauze and Kerlix/Conform [name], [name]  ([id number]) Wound #9 Left Calcaneous o ABD pad o Gauze and Kerlix/Conform Dressing Change Frequency Wound #12 Left,Plantar,Circumferential Foot o Change dressing every day. Wound #9 Left Calcaneous o Change dressing every day. Follow-up Appointments Wound #12 Left,Plantar,Circumferential Foot o Other: - as patient is able Wound #9 Left Calcaneous o Other: -  as patient is able Off-Loading Wound #12 Left,Plantar,Circumferential Foot o Open toe surgical shoe with peg assist. Wound #9 Left Calcaneous o Open toe surgical shoe with peg assist. Home Health Wound #12 Left,Plantar,Circumferential Foot o Continue Home Health Visits - [facility] o Home Health Nurse may visit PRN to address patientos wound care needs. o FACE TO FACE ENCOUNTER: MEDICARE and MEDICAID PATIENTS: I certify that this patient is under my care and that I had a face-to-face encounter that meets the physician face-to-face encounter requirements with this patient on this date. The encounter with the patient was in whole or in part for the following MEDICAL CONDITION: (primary reason for Home Healthcare) MEDICAL NECESSITY: I certify, that based on my findings, NURSING services are a medically necessary home health service. HOME BOUND STATUS: I certify that my clinical findings support that this patient is homebound (i.e., Due to illness or injury, pt requires aid of supportive devices such as crutches, cane, wheelchairs, walkers, the use of special transportation or the assistance of another person to leave their place of residence. There is a normal inability to leave the home and doing so requires considerable and taxing effort. Other absences are for medical reasons / religious services and are infrequent or of short duration when for other reasons). o If current dressing causes regression in wound condition, may D/C ordered dressing product/s and apply Normal Saline Moist Dressing daily until  next Wound Healing Center / Other MD appointment. Notify Wound Healing Center of regression in wound condition at [phone]. o Please direct any NON-WOUND related issues/requests for orders to patient's Primary Care Physician [name], [name] ([id number]) Wound #9 Left Calcaneous o Continue Home Health Visits - [facility] o Home Health Nurse may visit PRN to address patientos wound care needs. o FACE TO FACE ENCOUNTER: MEDICARE and MEDICAID PATIENTS: I certify that this patient is under my care and that I had a face-to-face encounter that meets the physician face-to-face encounter requirements with this patient on this date. The encounter with the patient was in whole or in part for the following MEDICAL CONDITION: (primary reason for Home Healthcare) MEDICAL NECESSITY: I certify, that based on my findings, NURSING services are a medically necessary home health service. HOME BOUND STATUS: I certify that my clinical findings support that this patient is homebound (i.e., Due to illness or injury, pt requires aid of supportive devices such as crutches, cane, wheelchairs, walkers, the use of special transportation or the assistance of another person to leave their place of residence. There is a normal inability to leave the home and doing so requires considerable and taxing effort. Other absences are for medical reasons / religious services and are infrequent or of short duration when for other reasons). o If current dressing causes regression in wound condition, may D/C ordered dressing product/s and apply Normal Saline Moist Dressing daily until next Wound Healing Center / Other MD appointment. Notify Wound Healing Center of regression in wound condition at [phone]. o Please direct any NON-WOUND related issues/requests for orders to patient's Primary Care Physician Electronic Signature(s) Signed: 12/23/2015 9:35:32 AM By: Ardath Sax MD Signed: 12/23/2015 5:48:30 PM By:  [name] Previous Signature: 12/20/2015 4:16:05 PM Version By: Ardath Sax MD Entered By: [name] on 12/20/2015 16:30:12 [name] ([id number]) -------------------------------------------------------------------------------- Problem List Details [name], [name] 12/20/2015 3:00 Patient Name: Date of Service: N. PM Medical Record Patient Account Number: [id number] Number: Treating RN: Phillis Haggis Date of Birth/Sex: 12-Feb-1981 (34 y.o. Female) Other Clinician: Primary Care Physician:  Margie Ege Referring Physician: [name] Physician/Extender: Tania Ade in Treatment: 16 Active Problems ICD-10 Encounter Code Description Active Date Diagnosis E10.621 Type 1 diabetes mellitus with foot ulcer 08/30/2015 Yes E10.52 Type 1 diabetes mellitus with diabetic peripheral 08/30/2015 Yes angiopathy with gangrene I70.245 Atherosclerosis of native arteries of left leg with ulceration 08/30/2015 Yes of other part of foot I70.261 Atherosclerosis of native arteries of extremities with 08/30/2015 Yes gangrene, right leg L89.622 Pressure ulcer of left heel, stage 2 08/30/2015 Yes L02.611 Cutaneous abscess of right foot 10/01/2015 Yes Z89.511 Acquired absence of right leg below knee 11/29/2015 Yes Inactive Problems Resolved Problems Electronic Signature(s) Signed: 12/20/2015 3:45:09 PM By: Ardath Sax MD Entered By: Ardath Sax on 12/20/2015 15:45:09 [name] ([id number]) [name], [name] ([id number]) -------------------------------------------------------------------------------- Progress Note Details [name], [name] 12/20/2015 3:00 Patient Name: Date of Service: N. PM Medical Record Patient Account Number: [id number] Number: Treating RN: Phillis Haggis Date of Birth/Sex: [birth year] (34 y.o. Female) Other Clinician: Primary Care Physician: Juanita Craver, Eugene Zeiders Referring  Physician: [name] Physician/Extender: Tania Ade in Treatment: 16 Subjective Chief Complaint Information obtained from Patient Patient in today for treatment of non-healing wound and HBO Treatment. she has just gotten out of hospital this week and is back to resume her hyperbaric oxygen therapy History of Present Illness (HPI) The following HPI elements were documented for the patient's wound: Location: dry gangrene both feet and heels Quality: Patient reports No Pain. Severity: Patient states wound are getting worse. Duration: Patient has had the wound for > 4months prior to seeking treatment at the wound center Context: The wound appeared gradually over time Modifying Factors: she has been in and out of hospital over the last 2 months Associated Signs and Symptoms: Patient reports having difficulty standing for long periods. Tykisha Areola is a 35 y.o. female who presents to our wound center, back in June 2016, referred by her PCP Dr. Zada Finders for nonhealing ulcers on the lateral aspect of the right heel. Of note she has a history of type 1 diabetes mellitus that has been uncontrolled. Past medical history significant for type 1 diabetes mellitus not controlled, ankylosing spondylitis, anorexia nervosa, irritable bowel syndrome, chronic kidney disease, chronic diarrhea. she then developed gangrene of both feet due to severe peripheral vascular disease and also had gangrene of the tips of her fingers due to upper extremity vascular disease. She was being worked up by vascular surgery at [facility] and at [facility] and has had several procedures done there. She started with hyperbaric oxygen therapy and had a total of 40 treatments the last one being on 06/20/2015. After the initial treatment of hyperbaric oxygen therapy she started having ear problems and had ultimately to use myringotomy tubes and this was done bilaterally. Since then her ears have been doing fine. In late September, she had  seen vascular and hand surgeons. since then she's been in [place] at the rec center for surgery involving extensive vascular procedures for the upper extremities. She was then at [facility] with GI bleeds both upper and lower and has been in and out of hospital for that. She has recently been out of hospital for the last week. 09/09/2015 -- she was unable to get here in time to start her hyperbaric oxygen today and hence is only here for a wound care visit. 09/19/2015 -- she has been having vancomycin during her dialysis and continues to have vascular appointments and the procedure is been set for early January. She has been  unable to make it for her hemodialysis due to various medical symptoms. [name], [name] ([id number]) 09/30/2014 -- her vancomycin was stopped on 09/25/2015 and the mother has noticed the right foot has started draining for the last 3 days. Addendum: after examining the patient I was able to talk to her primary vascular surgeon Dr. Pernell Dupre at the [facility]. I told him about the necrotic area on the plantar aspect of right foot which is now wet gangrene and he agreed with me that he would admit her at [facility] under her care and synchronize further treatment. We have discussed her poor prognosis and he and I discussed the need for hospice care and for sitting down and talking to the patient and her mother and giving them a proper detail of the prognosis. 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 She was worked up with MRIs, abdomen aortogram and placement of  left-sided angioplasties were done. After a prolonged hospital she she was discharged home and was told to wear shrinker sock and stump protector and see her surgeon for further instructions regarding wound care and suture removal. Asked to take long-term doxycycline. Objective Constitutional Vitals Time Taken: 3:22 PM, Height: 68 in, Weight: 96 lbs, BMI: 14.6, Respiratory Rate: 18 breaths/min, Blood Pressure: 95/57 mmHg. Integumentary (Hair, Skin) Wound #12 status is Open. Original cause of wound was Gradually Appeared. The wound is located on the Left,Plantar,Circumferential Foot. The wound measures 9.3cm length x 9cm width x 0.2cm depth; 65.738cm^2 area and 13.148cm^3 volume. The wound is limited to skin breakdown. There is no tunneling or undermining noted. There is a large amount of serous drainage noted. The wound margin is indistinct and nonvisible. There is small (1-33%) red granulation within the wound bed. There is a large (67-100%) amount of necrotic tissue within the wound bed including Eschar and Adherent Slough. The periwound skin appearance exhibited: Localized Edema, Moist. The periwound skin appearance did not exhibit: Callus, Crepitus, Excoriation, Fluctuance, Friable, Induration, Rash, Scarring, Dry/Scaly, Maceration, Atrophie Blanche, Cyanosis, Ecchymosis, Hemosiderin Staining, Mottled, Pallor, Rubor, Erythema. Periwound temperature was noted as No Abnormality. Wound #9 status is Open. Original cause of wound was Pressure Injury. The wound is located on the Left Calcaneous. The wound measures 4.2cm length x 6.1cm width x 0.1cm depth; 20.122cm^2 area and 2.012cm^3 volume. The wound is limited to skin breakdown. There is no tunneling or undermining noted. There is a small amount of serous drainage noted. The wound margin is distinct with the outline attached to the wound base. There is no granulation within the wound bed. There is a large (67-100%) amount of necrotic tissue  within the wound bed including Eschar and Adherent Slough. The periwound skin appearance exhibited: Moist. The periwound skin appearance did not exhibit: Callus, Crepitus, Excoriation, Leidy, Emogene N. ([id number]) Fluctuance, Friable, Induration, Localized Edema, Rash, Scarring, Dry/Scaly, Maceration, Atrophie Blanche, Cyanosis, Ecchymosis, Hemosiderin Staining, Mottled, Pallor, Rubor, Erythema. Periwound temperature was noted as No Abnormality. The periwound has tenderness on palpation. Assessment Active Problems ICD-10 E10.621 - Type 1 diabetes mellitus with foot ulcer E10.52 - Type 1 diabetes mellitus with diabetic peripheral angiopathy with gangrene I70.245 - Atherosclerosis of native  arteries of left leg with ulceration of other part of foot I70.261 - Atherosclerosis of native arteries of extremities with gangrene, right leg S50.539 - Pressure ulcer of left heel, stage 2 L02.611 - Cutaneous abscess of right foot Z89.511 - Acquired absence of right leg below knee Plan Wound Cleansing: Wound #12 Left,Plantar,Circumferential Foot: Cleanse wound with mild soap and water Wound #9 Left Calcaneous: Cleanse wound with mild soap and water Primary Wound Dressing: Wound #12 Left,Plantar,Circumferential Foot: Aquacel Ag - to wet areas ****PLEASE ORDER THE 10CM / 4IN X 5 IN SQUARES ****** Other: - betadine STICKS paint to dry areas ****PLEASE ORDER SOME FOR PT***** Wound #9 Left Calcaneous: Aquacel Ag - to wet areas ****PLEASE ORDER THE 10CM / 4IN X 5 IN SQUARES ****** Other: - betadine STICKS paint to dry areas ****PLEASE ORDER SOME FOR PT***** Secondary Dressing: Wound #12 Left,Plantar,Circumferential Foot: Gauze and Kerlix/Conform Wound #9 Left Calcaneous: Gauze and Kerlix/Conform Dressing Change Frequency: Wound #12 Left,Plantar,Circumferential Foot: Change dressing every day. Wound #9 Left Calcaneous: Change dressing every day. Follow-up Appointments: [name], [name]  ([id number]) Wound #12 Left,Plantar,Circumferential Foot: Other: - as patient is able Wound #9 Left Calcaneous: Other: - as patient is able Off-Loading: Wound #12 Left,Plantar,Circumferential Foot: Open toe surgical shoe with peg assist. Wound #9 Left Calcaneous: Open toe surgical shoe with peg assist. Home Health: Wound #12 Left,Plantar,Circumferential Foot: Continue Home Health Visits - [facility] Home Health Nurse may visit PRN to address patient s wound care needs. FACE TO FACE ENCOUNTER: MEDICARE and MEDICAID PATIENTS: I certify that this patient is under my care and that I had a face-to-face encounter that meets the physician face-to-face encounter requirements with this patient on this date. The encounter with the patient was in whole or in part for the following MEDICAL CONDITION: (primary reason for Home Healthcare) MEDICAL NECESSITY: I certify, that based on my findings, NURSING services are a medically necessary home health service. HOME BOUND STATUS: I certify that my clinical findings support that this patient is homebound (i.e., Due to illness or injury, pt requires aid of supportive devices such as crutches, cane, wheelchairs, walkers, the use of special transportation or the assistance of another person to leave their place of residence. There is a normal inability to leave the home and doing so requires considerable and taxing effort. Other absences are for medical reasons / religious services and are infrequent or of short duration when for other reasons). If current dressing causes regression in wound condition, may D/C ordered dressing product/s and apply Normal Saline Moist Dressing daily until next Wound Healing Center / Other MD appointment. Notify Wound Healing Center of regression in wound condition at [phone]. Please direct any NON-WOUND related issues/requests for orders to patient's Primary Care Physician Wound #9 Left Calcaneous: Continue Home Health Visits  - [facility] Home Health Nurse may visit PRN to address patient s wound care needs. FACE TO FACE ENCOUNTER: MEDICARE and MEDICAID PATIENTS: I certify that this patient is under my care and that I had a face-to-face encounter that meets the physician face-to-face encounter requirements with this patient on this date. The encounter with the patient was in whole or in part for the following MEDICAL CONDITION: (primary reason for Home Healthcare) MEDICAL NECESSITY: I certify, that based on my findings, NURSING services are a medically necessary home health service. HOME BOUND STATUS: I certify that my clinical findings support that this patient is homebound (i.e., Due to illness or injury, pt requires aid of supportive devices such as crutches,  cane, wheelchairs, walkers, the use of special transportation or the assistance of another person to leave their place of residence. There is a normal inability to leave the home and doing so requires considerable and taxing effort. Other absences are for medical reasons / religious services and are infrequent or of short duration when for other reasons). If current dressing causes regression in wound condition, may D/C ordered dressing product/s and apply Normal Saline Moist Dressing daily until next [facility]aling [facility] / Other MD appointment. Notify [facility] of regression in wound condition at [phone]. 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 Diabetic. Needs transmet or perhaps BKA. Will continue Beta dine dressings for now. Has doppler dorsalis and posterior tib pulses. Electronic Signature(s) Signed: 12/20/2015 4:08:18 PM By: Ardath Sax MD Previous Signature: 12/20/2015 4:06:39 PM Version By: Ardath Sax MD Entered By: Ardath Sax on 12/20/2015 16:08:18 [name]  ([id number]) -------------------------------------------------------------------------------- SuperBill Details Patient Name: [name] Date of Service: 12/20/2015 Medical Record Number: [id number] Patient Account Number: [id number] Date of Birth/Sex: 02/26/1981 (34 y.o. 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 of native arteries of extremities with gangrene, right leg L89.622 Pressure ulcer of left heel, stage 2 L02.611 Cutaneous abscess of right foot Z89.511 Acquired absence of right leg below knee Facility Procedures CPT4 Code: 24401027 Description: 99213 - WOUND CARE VISIT-LEV 3 EST PT Modifier: Quantity: 1 Physician Procedures CPT4 Code: 2536644 Description: 99213 - WC PHYS LEVEL 3 - EST PT ICD-10 Description Diagnosis E10.621 Type 1 diabetes mellitus with foot ulcer L89.622 Pressure ulcer of left heel, stage 2 Modifier: Quantity: 1 Electronic Signature(s) Signed: 12/23/2015 9:35:32 AM By: Ardath Sax MD Previous Signature: 12/20/2015 4:09:09 PM Version By: Ardath Sax MD Entered By: Ardath Sax on 12/23/2015 08:53:23

## 2015-12-24 NOTE — Progress Notes (Signed)
ADLAI, SOS ([id number]) Visit Report for 12/20/2015 Arrival Information Details Patient Name: [name], [name] Date of Service: 12/20/2015 3:00 PM Medical Record Number: [id number] Patient Account Number: [id number] Date of Birth/Sex: 23-Jan-1981 (35 y.o. Female) Treating RN: Ashok Cordia, Debi Primary Care Physician: [name] Other Clinician: Referring Physician: [name] Treating Physician/Extender: Rudene Re in Treatment: 16 Visit Information History Since Last Visit All ordered tests and consults were completed: No Patient Arrived: Wheel Chair Added or deleted any medications: No Arrival Time: 15:21 Any new allergies or adverse reactions: No Accompanied By: mother Had a fall or experienced change in No activities of daily living that may affect Transfer Assistance: Other risk of falls: Patient Identification Verified: Yes Signs or symptoms of abuse/neglect since last No Secondary Verification Process Yes visito Completed: Hospitalized since last visit: No Patient Requires Transmission-Based No Pain Present Now: No Precautions: Patient Has Alerts: Yes Electronic Signature(s) Signed: 12/23/2015 5:48:30 PM By: [name] Entered By: [name] on 12/20/2015 15:22:06 [name] ([id number]) -------------------------------------------------------------------------------- Clinic Level of Care Assessment Details Patient Name: [name] Date of Service: 12/20/2015 3:00 PM Medical Record Number: [id number] Patient Account Number: [id number] Date of Birth/Sex: [birth year] (35 y.o. Female) Treating RN: Ashok Cordia, Debi Primary Care Physician: [name] Other Clinician: Referring Physician: [name] Treating Physician/Extender: Elayne Snare in Treatment: 16 Clinic Level of Care Assessment Items TOOL 4 Quantity Score []  - Use when only an EandM is performed on FOLLOW-UP visit 0 ASSESSMENTS - Nursing  Assessment / Reassessment []  - Reassessment of Co-morbidities (includes updates in patient status) 0 X - Reassessment of Adherence to Treatment Plan 1 5 ASSESSMENTS - Wound and Skin Assessment / Reassessment []  - Simple Wound Assessment / Reassessment - one wound 0 X - Complex Wound Assessment / Reassessment - multiple wounds 2 5 []  - Dermatologic / Skin Assessment (not related to wound area) 0 ASSESSMENTS - Focused Assessment []  - Circumferential Edema Measurements - multi extremities 0 []  - Nutritional Assessment / Counseling / Intervention 0 []  - Lower Extremity Assessment (monofilament, tuning fork, pulses) 0 []  - Peripheral Arterial Disease Assessment (using hand held doppler) 0 ASSESSMENTS - Ostomy and/or Continence Assessment and Care []  - Incontinence Assessment and Management 0 []  - Ostomy Care Assessment and Management (repouching, etc.) 0 PROCESS - Coordination of Care []  - Simple Patient / Family Education for ongoing care 0 X - Complex (extensive) Patient / Family Education for ongoing care 1 20 []  - Staff obtains Chiropractor, Records, Test Results / Process Orders 0 X - Staff telephones HHA, Nursing Homes / Clarify orders / etc 1 10 []  - Routine Transfer to another Facility (non-emergent condition) 0 Tillison, Bowie N. ([id number]) []  - Routine Hospital Admission (non-emergent condition) 0 []  - New Admissions / Manufacturing engineer / Ordering NPWT, Apligraf, etc. 0 []  - Emergency Hospital Admission (emergent condition) 0 X - Simple Discharge Coordination 1 10 []  - Complex (extensive) Discharge Coordination 0 PROCESS - Special Needs []  - Pediatric / Minor Patient Management 0 []  - Isolation Patient Management 0 []  - Hearing / Language / Visual special needs 0 []  - Assessment of Community assistance (transportation, D/C planning, etc.) 0 []  - Additional assistance / Altered mentation 0 []  - Support Surface(s) Assessment (bed, cushion, seat, etc.) 0 INTERVENTIONS -  Wound Cleansing / Measurement []  - Simple Wound Cleansing - one wound 0 X - Complex Wound Cleansing - multiple wounds 2 5 X - Wound Imaging (photographs - any number of wounds) 1 5 []  -  Wound Tracing (instead of photographs) 0  - Simple Wound Measurement - one wound 0 X - Complex Wound Measurement - multiple wounds 2 5 INTERVENTIONS - Wound Dressings  - Small Wound Dressing one or multiple wounds 0  - Medium Wound Dressing one or multiple wounds 0 X - Large Wound Dressing one or multiple wounds 2 20  - Application of Medications - topical 0  - Application of Medications - injection 0 INTERVENTIONS - Miscellaneous  - External ear exam 0 Washabaugh, Arlicia N. ([id number])  - Specimen Collection (cultures, biopsies, blood, body fluids, etc.) 0  - Specimen(s) / Culture(s) sent or taken to Lab for analysis 0  - Patient Transfer (multiple staff / Michiel Sites Lift / Similar devices) 0  - Simple Staple / Suture removal (25 or less) 0  - Complex Staple / Suture removal (26 or more) 0  - Hypo / Hyperglycemic Management (close monitor of Blood Glucose) 0  - Ankle / Brachial Index (ABI) - do not check if billed separately 0 X - Vital Signs 1 5 Has the patient been seen at the hospital within the last three years: Yes Total Score: 125 Level Of Care: New/Established - Level 4 Electronic Signature(s) Signed: 12/23/2015 5:48:30 PM By: [name] Entered By: [name] on 12/20/2015 16:31:55 [name] ([id number]) -------------------------------------------------------------------------------- Encounter Discharge Information Details Patient Name: [name] Date of Service: 12/20/2015 3:00 PM Medical Record Number: [id number] Patient Account Number: [id number] Date of Birth/Sex: 09/02/1981 (35 y.o. Female) Treating RN: Ashok Cordia, Debi Primary Care Physician: [name] Other Clinician: Referring Physician: [name] Treating  Physician/Extender: Elayne Snare in Treatment: 37 Encounter Discharge Information Items Discharge Pain Level: 0 Discharge Condition: Stable Ambulatory Status: Wheelchair Discharge Destination: Home Transportation: Private Auto Accompanied By: mother Schedule Follow-up Appointment: Yes Medication Reconciliation completed and provided to Patient/Care Yes Maysen Bonsignore: Provided on Clinical Summary of Care: 12/20/2015 Form Type Recipient Paper Patient HB Electronic Signature(s) Signed: 12/20/2015 4:12:47 PM By: Gwenlyn Perking Previous Signature: 12/20/2015 4:09:33 PM Version By: Ardath Sax MD Entered By: Gwenlyn Perking on 12/20/2015 16:12:47 [name] ([id number]) -------------------------------------------------------------------------------- Lower Extremity Assessment Details Patient Name: [name] Date of Service: 12/20/2015 3:00 PM Medical Record Number: [id number] Patient Account Number: [id number] Date of Birth/Sex: 11-Sep-1981 (34 y.o. Female) Treating RN: Ashok Cordia, Debi Primary Care Physician: [name] Other Clinician: Referring Physician: [name] Treating Physician/Extender: Elayne Snare in Treatment: 16 Vascular Assessment Pulses: Posterior Tibial Palpable: [Left:No] Doppler: [Left:Monophasic] Dorsalis Pedis Palpable: [Left:No] Doppler: [Left:Monophasic] Extremity colors, hair growth, and conditions: Temperature of Extremity: [Left:Warm] Capillary Refill: [Left:< 3 seconds] Electronic Signature(s) Signed: 12/23/2015 5:48:30 PM By: [name] Entered By: [name] on 12/20/2015 16:29:16 [name] ([id number]) -------------------------------------------------------------------------------- Multi Wound Chart Details Patient Name: [name] Date of Service: 12/20/2015 3:00 PM Medical Record Number: [id number] Patient Account Number: [id number] Date of Birth/Sex: 1980/11/20 (34 y.o.  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] [9:the Lower Extremity] Comorbid History: [12:Anemia, Type I Diabetes, Anemia, Type I Diabetes, N/A End Stage Renal Disease, End Stage Renal Disease, Rheumatoid Arthritis, Neuropathy] [9:Rheumatoid Arthritis,  Neuropathy] Date Acquired: [12:07/28/2015] [9:07/28/2015] [N/A:N/A] Weeks of Treatment: [12:16] [9:16] [N/A:N/A] Wound Status: [12:Open] [9:Open] [N/A:N/A] Pending Amputation on Yes [9:No] [N/A:N/A] Presentation: Measurements L x W x D 9.3x9x0.2 [9:4.2x6.1x0.1] [N/A:N/A] (cm) Area (cm) : [12:65.738] [9:20.122] [N/A:N/A] Volume (cm) : [12:13.148] [9:2.012] [N/A:N/A] % Reduction in Area: [12:-52.20%] [9:-8.30%] [N/A:N/A] % Reduction in Volume: -204.40% [9:-8.30%] [N/A:N/A] Classification: [12:Grade 2] [9:Grade 2] [N/A:N/A] Exudate Amount: [12:Large] [9:Small] [N/A:N/A] Exudate Type: [12:Serous] [9:Serous] [N/A:N/A] Exudate Color: [12:amber] [9:amber] [N/A:N/A] Foul Odor After [12:Yes] [9:Yes] [N/A:N/A] Cleansing: Odor Anticipated Due to No [9:No] [N/A:N/A] Product Use: Wound Margin: [12:Indistinct, nonvisible] [9:Distinct, outline attached N/A] Granulation Amount: Small (1-33%) None Present (0%) N/A Granulation Quality: Red N/A N/A Necrotic Amount: Large (67-100%) Large (67-100%) N/A Necrotic Tissue: Eschar, Adherent  Slough Eschar, Adherent Slough N/A Exposed Structures: Fascia: No Fascia: No N/A Fat: No Fat: No Tendon: No Tendon: No Muscle: No Muscle: No Joint: No Joint: No Bone: No Bone: No Limited to Skin Limited to Skin Breakdown Breakdown Epithelialization: None None N/A Periwound Skin Texture: Edema: Yes Edema: No N/A Excoriation: No Excoriation: No Induration: No Induration: No Callus: No Callus: No Crepitus: No Crepitus: No Fluctuance: No Fluctuance: No Friable: No Friable: No Rash: No Rash: No Scarring: No Scarring: No Periwound Skin Moist: Yes Moist: Yes N/A Moisture: Maceration: No Maceration: No Dry/Scaly: No Dry/Scaly: No Periwound Skin Color: Atrophie Blanche: No Atrophie Blanche: No N/A Cyanosis: No Cyanosis: No Ecchymosis: No Ecchymosis: No Erythema: No Erythema: No Hemosiderin Staining: No Hemosiderin Staining: No Mottled: No Mottled: No Pallor: No Pallor: No Rubor: No Rubor: No Temperature: No Abnormality No Abnormality N/A Tenderness on No Yes N/A Palpation: Wound Preparation: Ulcer Cleansing: Ulcer Cleansing: N/A Rinsed/Irrigated with Rinsed/Irrigated with Saline Saline Topical Anesthetic Topical Anesthetic Applied: None Applied: None Treatment Notes Electronic Signature(s) Signed: 12/23/2015 5:48:30 PM By: [name] Entered By: [name] on 12/20/2015 15:36:59 [name] ([id number] Craft, Ledell Peoples ([id number]) -------------------------------------------------------------------------------- Multi-Disciplinary Care Plan Details Patient Name: [name] Date of Service: 12/20/2015 3:00 PM Medical Record Number: [id number] Patient Account Number: [id number] Date of Birth/Sex: 16-May-1981 (34 y.o. Female) Treating RN: Ashok Cordia, Debi Primary Care Physician: [name] Other Clinician: Referring Physician: [name] Treating Physician/Extender: Elayne Snare in Treatment:  16 Active Inactive HBO Nursing Diagnoses: Anxiety related to feelings of confinement associated with the hyperbaric oxygen chamber Anxiety related to knowledge deficit of hyperbaric oxygen therapy and treatment procedures Discomfort related to temperature and humidity changes inside hyperbaric chamber Potential for barotraumas to ears, sinuses, teeth, and lungs or cerebral gas embolism related to changes in atmospheric pressure inside hyperbaric oxygen chamber Potential for oxygen toxicity seizures related to delivery of 100% oxygen at an increased atmospheric pressure Potential for pulmonary oxygen toxicity related to delivery of 100% oxygen at an increased atmospheric pressure Goals: Barotrauma will be prevented during HBO2 Date Initiated: 08/30/2015 Goal Status: Active Patient and/or family will be able to state/discuss factors appropriate to the management of their disease process during treatment Date Initiated: 08/30/2015 Goal Status: Active Patient will tolerate the hyperbaric oxygen therapy treatment Date Initiated: 08/30/2015 Goal Status: Active Patient will tolerate the internal climate of the chamber Date Initiated: 08/30/2015 Goal Status: Active Patient/caregiver will verbalize understanding of HBO goals, rationale, procedures and potential hazards Date Initiated: 08/30/2015 Goal Status: Active Signs and symptoms of pulmonary oxygen toxicity will be recognized and promptly addressed Date Initiated: 08/30/2015 Goal Status: Active Signs and symptoms of seizure will be recognized and promptly addressed ; seizing patients will suffer no harm  Date Initiated: 08/30/2015 THANA, RAMP ([id number]) Goal Status: Active Interventions: Administer a five (5) minute air break for patient if signs and symptoms of seizure appear and notify the hyperbaric physician Administer a ten (10) minute air break for patient if signs and symptoms of seizure appear and notify  the hyperbaric physician Administer decongestants, per physician orders, prior to HBO2 Administer the correct therapeutic gas delivery based on the patients needs and limitations, per physician order Assess and provide for patientos comfort related to the hyperbaric environment and equalization of middle ear Assess for signs and symptoms related to adverse events, including but not limited to confinement anxiety, pneumothorax, oxygen toxicity and baurotrauma Assess patient for any history of confinement anxiety Assess patient's knowledge and expectations regarding hyperbaric medicine and provide education related to the hyperbaric environment, goals of treatment and prevention of adverse events Implement protocols to decrease risk of pneumothorax in high risk patients Notes: Abuse / Safety / Falls / Self Care Management Nursing Diagnoses: Potential for falls Goals: Patient will remain injury free Date Initiated: 09/19/2015 Goal Status: Active Patient/caregiver will verbalize/demonstrate measures taken to prevent injury and/or falls Date Initiated: 09/19/2015 Goal Status: Active Interventions: Assess fall risk on admission and as needed Assess impairment of mobility on admission and as needed per policy Notes: Orientation to the Wound Care Program Nursing Diagnoses: Knowledge deficit related to the wound healing center program Goals: Patient/caregiver will verbalize understanding of the Wound Healing Center Program Adrian ([id number]) Date Initiated: 08/30/2015 Goal Status: Active Interventions: Provide education on orientation to the wound center Notes: Pressure Nursing Diagnoses: Knowledge deficit related to causes and risk factors for pressure ulcer development Knowledge deficit related to management of pressures ulcers Potential for impaired tissue integrity related to pressure, friction, moisture, and shear Goals: Patient will remain free from development  of additional pressure ulcers Date Initiated: 08/30/2015 Goal Status: Active Patient will remain free of pressure ulcers Date Initiated: 08/30/2015 Goal Status: Active Patient/caregiver will verbalize risk factors for pressure ulcer development Date Initiated: 08/30/2015 Goal Status: Active Patient/caregiver will verbalize understanding of pressure ulcer management Date Initiated: 08/30/2015 Goal Status: Active Interventions: Assess: immobility, friction, shearing, incontinence upon admission and as needed Assess offloading mechanisms upon admission and as needed Assess potential for pressure ulcer upon admission and as needed Provide education on pressure ulcers Treatment Activities: Patient referred for home evaluation of offloading devices/mattresses : 12/20/2015 Patient referred for pressure reduction/relief devices : 12/20/2015 Pressure reduction/relief device ordered : 12/20/2015 Notes: Wound/Skin Impairment Nursing Diagnoses: Impaired tissue integrity Gerke, Laurice N. ([id number]) Knowledge deficit related to ulceration/compromised skin integrity Goals: Patient will have a decrease in wound volume by X% from date: (specify in notes) Date Initiated: 08/30/2015 Goal Status: Active Patient/caregiver will verbalize understanding of skin care regimen Date Initiated: 08/30/2015 Goal Status: Active Ulcer/skin breakdown will have a volume reduction of 30% by week 4 Date Initiated: 08/30/2015 Goal Status: Active Ulcer/skin breakdown will have a volume reduction of 50% by week 8 Date Initiated: 08/30/2015 Goal Status: Active Ulcer/skin breakdown will have a volume reduction of 80% by week 12 Date Initiated: 08/30/2015 Goal Status: Active Ulcer/skin breakdown will heal within 14 weeks Date Initiated: 08/30/2015 Goal Status: Active Interventions: Assess patient/caregiver ability to obtain necessary supplies Assess patient/caregiver ability to perform ulcer/skin care regimen upon  admission and as needed Assess ulceration(s) every visit Provide education on ulcer and skin care Notes: Electronic Signature(s) Signed: 12/23/2015 5:48:30 PM By: [name] Entered By: [name] on 12/20/2015 15:36:37 Marion, Christien N. (  [id number]) -------------------------------------------------------------------------------- Pain Assessment Details Patient Name: CAMESHA, FAROOQ. Date of Service: 12/20/2015 3:00 PM Medical Record Number: [id number] Patient Account Number: [id number] Date of Birth/Sex: Nov 12, 1980 (34 y.o. Female) Treating RN: Ashok Cordia, Debi Primary Care Physician: [name] Other Clinician: Referring Physician: [name] Treating Physician/Extender: Rudene Re in Treatment: 16 Active Problems Location of Pain Severity and Description of Pain Patient Has Paino No Site Locations Pain Management and Medication Current Pain Management: Electronic Signature(s) Signed: 12/23/2015 5:48:30 PM By: [name] Entered By: [name] on 12/20/2015 15:22:23 [name] ([id number]) -------------------------------------------------------------------------------- Patient/Caregiver Education Details Patient Name: [name] Date of Service: 12/20/2015 3:00 PM Medical Record Number: [id number] Patient Account Number: [id number] Date of Birth/Gender: September 24, 1981 (35 y.o. Female) Treating RN: Ashok Cordia, Debi Primary Care Physician: [name] Other Clinician: Referring Physician: [name] Treating Physician/Extender: Elayne Snare in Treatment: 16 Education Assessment Education Provided To: Patient and Caregiver Education Topics Provided Wound/Skin Impairment: Handouts: Other: change dressing as ordered Methods: Demonstration, Explain/Verbal Responses: State content correctly Electronic Signature(s) Signed: 12/23/2015 5:48:30 PM By: [name] Previous Signature: 12/20/2015 4:09:45  PM Version By: Ardath Sax MD Entered By: [name] on 12/20/2015 16:12:39 [name] ([id number]) -------------------------------------------------------------------------------- Wound Assessment Details Patient Name: [name] Date of Service: 12/20/2015 3:00 PM Medical Record Number: [id number] Patient Account Number: [id number] Date of Birth/Sex: 1980/12/25 (34 y.o. Female) Treating RN: Ashok Cordia, Debi Primary Care Physician: [name] Other Clinician: Referring Physician: [name] Treating Physician/Extender: Elayne Snare in Treatment: 16 Wound Status Wound Number: 12 Primary Diabetic Wound/Ulcer of the Lower Etiology: Extremity Wound Location: Left Foot - Plantar, Circumfernential Wound Open Status: Wounding Event: Gradually Appeared Comorbid Anemia, Type I Diabetes, End Stage Date Acquired: 07/28/2015 History: Renal Disease, Rheumatoid Arthritis, Weeks Of Treatment: 16 Neuropathy Clustered Wound: No Pending Amputation On Presentation Photos Photo Uploaded By: [name] on 12/20/2015 16:46:28 Wound Measurements Length: (cm) 9.3 Width: (cm) 9 Depth: (cm) 0.2 Area: (cm) 65.738 Volume: (cm) 13.148 % Reduction in Area: -52.2% % Reduction in Volume: -204.4% Epithelialization: None Tunneling: No Undermining: No Wound Description Classification: Grade 2 Foul Odor Afte Wound Margin: Indistinct, nonvisible Due to Product Exudate Amount: Large Exudate Type: Serous Exudate Color: amber r Cleansing: Yes Use: No Wound Bed Granulation Amount: Small (1-33%) Exposed Structure Granulation Quality: Red Fascia Exposed: No Hobin, Namine N. ([id number]) Necrotic Amount: Large (67-100%) Fat Layer Exposed: No Necrotic Quality: Eschar, Adherent Slough Tendon Exposed: No Muscle Exposed: No Joint Exposed: No Bone Exposed: No Limited to Skin Breakdown Periwound Skin Texture Texture Color No Abnormalities Noted:  No No Abnormalities Noted: No Callus: No Atrophie Blanche: No Crepitus: No Cyanosis: No Excoriation: No Ecchymosis: No Fluctuance: No Erythema: No Friable: No Hemosiderin Staining: No Induration: No Mottled: No Localized Edema: Yes Pallor: No Rash: No Rubor: No Scarring: No Temperature / Pain Moisture Temperature: No Abnormality No Abnormalities Noted: No Dry / Scaly: No Maceration: No Moist: Yes Wound Preparation Ulcer Cleansing: Rinsed/Irrigated with Saline Topical Anesthetic Applied: None Treatment Notes Wound #12 (Left, Plantar, Circumferential Foot) 1. Cleansed with: Clean wound with Normal Saline 4. Dressing Applied: Aquacel Ag 5. Secondary Dressing Applied ABD Pad Gauze and Kerlix/Conform 7. Secured with Tape Notes BETADINE PAINT Electronic Signature(s) Signed: 12/23/2015 5:48:30 PM By: [name] Entered By: [name] on 12/20/2015 15:36:00 [name] ([id number]) Simeon Craft, Ledell Peoples ([id number]) -------------------------------------------------------------------------------- Wound Assessment Details Patient Name: [name] Date of Service: 12/20/2015 3:00 PM Medical Record Number: [id number] Patient Account Number: [id number] Date of Birth/Sex:  1981-01-31 (34 y.o. Female) Treating RN: Ashok Cordia, Debi Primary Care Physician: [name] Other Clinician: Referring Physician: [name] Treating Physician/Extender: Rudene Re in Treatment: 16 Wound Status Wound Number: 9 Primary Diabetic Wound/Ulcer of the Lower Etiology: Extremity Wound Location: Left Calcaneous Wound Open Wounding Event: Pressure Injury Status: Date Acquired: 07/28/2015 Comorbid Anemia, Type I Diabetes, End Stage Weeks Of Treatment: 16 History: Renal Disease, Rheumatoid Arthritis, Clustered Wound: No Neuropathy Photos Photo Uploaded By: [name] on 12/20/2015 16:46:41 Wound Measurements Length: (cm) 4.2 Width: (cm)  6.1 Depth: (cm) 0.1 Area: (cm) 20.122 Volume: (cm) 2.012 % Reduction in Area: -8.3% % Reduction in Volume: -8.3% Epithelialization: None Tunneling: No Undermining: No Wound Description Classification: Grade 2 Foul Odor Afte Wound Margin: Distinct, outline attached Due to Product Exudate Amount: Small Exudate Type: Serous Exudate Color: amber r Cleansing: Yes Use: No Wound Bed Granulation Amount: None Present (0%) Exposed Structure Necrotic Amount: Large (67-100%) Fascia Exposed: No Necrotic Quality: Eschar, Adherent Slough Fat Layer Exposed: No Tendon Exposed: No Bernabei, Ally N. ([id number]) Muscle Exposed: No Joint Exposed: No Bone Exposed: No Limited to Skin Breakdown Periwound Skin Texture Texture Color No Abnormalities Noted: No No Abnormalities Noted: No Callus: No Atrophie Blanche: No Crepitus: No Cyanosis: No Excoriation: No Ecchymosis: No Fluctuance: No Erythema: No Friable: No Hemosiderin Staining: No Induration: No Mottled: No Localized Edema: No Pallor: No Rash: No Rubor: No Scarring: No Temperature / Pain Moisture Temperature: No Abnormality No Abnormalities Noted: No Tenderness on Palpation: Yes Dry / Scaly: No Maceration: No Moist: Yes Wound Preparation Ulcer Cleansing: Rinsed/Irrigated with Saline Topical Anesthetic Applied: None Treatment Notes Wound #9 (Left Calcaneous) 1. Cleansed with: Clean wound with Normal Saline 4. Dressing Applied: Aquacel Ag 5. Secondary Dressing Applied ABD Pad Gauze and Kerlix/Conform 7. Secured with Tape Notes [employer] Electronic Signature(s) Signed: 12/23/2015 5:48:30 PM By: [name] Entered By: [name] on 12/20/2015 15:36:26 [name] ([id number]) -------------------------------------------------------------------------------- Vitals Details Patient Name: [name] Date of Service: 12/20/2015 3:00 PM Medical Record Number:  [id number] Patient Account Number: [id number] Date of Birth/Sex: July 25, 1981 (34 y.o. Female) Treating RN: Ashok Cordia, Debi Primary Care Physician: [name] Other Clinician: Referring Physician: [name] Treating Physician/Extender: Rudene Re in Treatment: 16 Vital Signs Time Taken: 15:22 Respiratory Rate (breaths/min): 18 Height (in): 68 Blood Pressure (mmHg): 95/57 Weight (lbs): 96 Reference Range: 80 - 120 mg / dl Body Mass Index (BMI): 14.6 Electronic Signature(s) Signed: 12/23/2015 5:48:30 PM By: [name] Entered By: [name] on 12/20/2015 15:24:40

## 2015-12-26 ENCOUNTER — Ambulatory Visit: Payer: Medicare Other | Admitting: Surgery

## 2015-12-30 ENCOUNTER — Encounter: Payer: Medicare Other | Attending: Surgery | Admitting: Surgery

## 2015-12-30 DIAGNOSIS — L89622 Pressure ulcer of left heel, stage 2: Secondary | ICD-10-CM | POA: Insufficient documentation

## 2015-12-30 DIAGNOSIS — Z681 Body mass index (BMI) 19 or less, adult: Secondary | ICD-10-CM | POA: Diagnosis not present

## 2015-12-30 DIAGNOSIS — E10621 Type 1 diabetes mellitus with foot ulcer: Secondary | ICD-10-CM | POA: Insufficient documentation

## 2015-12-30 DIAGNOSIS — F419 Anxiety disorder, unspecified: Secondary | ICD-10-CM | POA: Insufficient documentation

## 2015-12-30 DIAGNOSIS — E43 Unspecified severe protein-calorie malnutrition: Secondary | ICD-10-CM | POA: Insufficient documentation

## 2015-12-30 DIAGNOSIS — Z89511 Acquired absence of right leg below knee: Secondary | ICD-10-CM | POA: Diagnosis not present

## 2015-12-30 DIAGNOSIS — Z992 Dependence on renal dialysis: Secondary | ICD-10-CM | POA: Diagnosis not present

## 2015-12-30 DIAGNOSIS — I70261 Atherosclerosis of native arteries of extremities with gangrene, right leg: Secondary | ICD-10-CM | POA: Insufficient documentation

## 2015-12-30 DIAGNOSIS — N186 End stage renal disease: Secondary | ICD-10-CM | POA: Diagnosis not present

## 2015-12-30 DIAGNOSIS — I70245 Atherosclerosis of native arteries of left leg with ulceration of other part of foot: Secondary | ICD-10-CM | POA: Diagnosis not present

## 2015-12-30 DIAGNOSIS — E1052 Type 1 diabetes mellitus with diabetic peripheral angiopathy with gangrene: Secondary | ICD-10-CM | POA: Insufficient documentation

## 2015-12-30 DIAGNOSIS — E1022 Type 1 diabetes mellitus with diabetic chronic kidney disease: Secondary | ICD-10-CM | POA: Insufficient documentation

## 2015-12-30 DIAGNOSIS — M069 Rheumatoid arthritis, unspecified: Secondary | ICD-10-CM | POA: Diagnosis not present

## 2015-12-30 NOTE — Progress Notes (Addendum)
Wu, [name] ([id number]) Visit Report for 12/30/2015 Chief Complaint Document Details Patient Name: [name], [name] Date of Service: 12/30/2015 3:45 PM Medical Record Number: [id number] Patient Account Number: [id number] Date of Birth/Sex: 05/15/1981 (34 y.o. Female) Treating RN: [name] Primary Care Physician: [name] Other Clinician: Referring Physician: [name] Treating Physician/Extender: Rudene Re in Treatment: 17 Information Obtained from: Patient Chief Complaint Patient in today for treatment of non-healing wound and HBO Treatment. she has just gotten out of hospital this week and is back to resume her hyperbaric oxygen therapy Electronic Signature(s) Signed: 12/30/2015 3:58:08 PM By: [name] MD, FACS Entered By: [name] on 12/30/2015 15:58:08 [name] ([id number]) -------------------------------------------------------------------------------- Debridement Details Patient Name: [name] Date of Service: 12/30/2015 3:45 PM Medical Record Number: [id number] Patient Account Number: [id number] Date of Birth/Sex: 07-25-1981 (34 y.o. Female) Treating RN: [name] Primary Care Physician: [name] Other Clinician: Referring Physician: [name] Treating Physician/Extender: Rudene Re in Treatment: 17 Debridement Performed for Wound #12 Left,Plantar,Circumferential Foot Assessment: Performed By: Physician [name], MD Debridement: Open Wound/Selective Debridement Selective Description: Pre-procedure Yes Verification/Time Out Taken: Start Time: 04:05 Pain Control: Lidocaine 5% topical ointment Level: Skin/Dermis Total Area Debrided (L x 3 (cm) x 2 (cm) = 6 (cm) W): Tissue and other Non-Viable, Eschar, Skin material debrided: Instrument: Forceps, Scissors Bleeding: Minimum Hemostasis Achieved: Pressure End Time: 04:09 Procedural Pain: 0 Post Procedural Pain:  0 Response to Treatment: Procedure was tolerated well Post Debridement Measurements of Total Wound Length: (cm) 8.7 Width: (cm) 7.2 Depth: (cm) 0.2 Volume: (cm) 9.839 Post Procedure Diagnosis Same as Pre-procedure Electronic Signature(s) Signed: 12/30/2015 4:19:09 PM By: [name] MD, FACS Signed: 12/30/2015 5:22:46 PM By: [name] Entered By: [name] on 12/30/2015 16:19:09 [name] ([id number]) -------------------------------------------------------------------------------- Debridement Details Patient Name: [name] Date of Service: 12/30/2015 3:45 PM Medical Record Number: [id number] Patient Account Number: [id number] Date of Birth/Sex: 1980/10/24 (34 y.o. Female) Treating RN: [name] Primary Care Physician: [name] Other Clinician: Referring Physician: [name] Treating Physician/Extender: Rudene Re in Treatment: 17 Debridement Performed for Wound #9 Left Calcaneous Assessment: Performed By: Physician [name], MD Debridement: Open Wound/Selective Debridement Selective Description: Pre-procedure Yes Verification/Time Out Taken: Start Time: 04:05 Pain Control: Lidocaine 5% topical ointment Level: Skin/Dermis Total Area Debrided (L x 3 (cm) x 1 (cm) = 3 (cm) W): Tissue and other Non-Viable, Eschar, Fibrin/Slough, Skin material debrided: Instrument: Forceps, Scissors Bleeding: Minimum Hemostasis Achieved: Silver Nitrate End Time: 04:09 Procedural Pain: 0 Post Procedural Pain: 0 Response to Treatment: Procedure was tolerated well Post Debridement Measurements of Total Wound Length: (cm) 4 Width: (cm) 6 Depth: (cm) 0.2 Volume: (cm) 3.77 Post Procedure Diagnosis Same as Pre-procedure Electronic Signature(s) Signed: 12/30/2015 4:19:49 PM By: [name] MD, FACS Signed: 12/30/2015 5:22:46 PM By: [name] Entered By: [name] on 12/30/2015 16:19:49 [name]  ([id number]) -------------------------------------------------------------------------------- HPI Details Patient Name: [name] Date of Service: 12/30/2015 3:45 PM Medical Record Number: [id number] Patient Account Number: [id number] Date of Birth/Sex: 10-22-1980 (34 y.o. Female) Treating RN: [name] Primary Care Physician: [name] Other Clinician: Referring Physician: [name] Treating Physician/Extender: Rudene Re in Treatment: 17 History of Present Illness Location: dry gangrene both feet and heels Quality: Patient reports No Pain. Severity: Patient states wound are getting worse. Duration: Patient has had the wound for > 68months prior to seeking treatment at the wound center Context: The wound appeared gradually over time Modifying Factors: she  has been in and out of hospital over the last 2 months Associated Signs and Symptoms: Patient reports having difficulty standing for long periods. HPI Description: [name] is a 35 y.o. female who presents to our wound center, back in June 2016, referred by her PCP Dr. Zada Finders for nonhealing ulcers on the lateral aspect of the right heel. Of note she has a history of type 1 diabetes mellitus that has been uncontrolled. Past medical history significant for type 1 diabetes mellitus not controlled, ankylosing spondylitis, anorexia nervosa, irritable bowel syndrome, chronic kidney disease, chronic diarrhea. she then developed gangrene of both feet due to severe peripheral vascular disease and also had gangrene of the tips of her fingers due to upper extremity vascular disease. She was being worked up by vascular surgery at [facility] and at [facility] and has had several procedures done there. She started with hyperbaric oxygen therapy and had a total of 40 treatments the last one being on 06/20/2015. After the initial treatment of hyperbaric oxygen therapy she started having ear problems and had ultimately to  use myringotomy tubes and this was done bilaterally. Since then her ears have been doing fine. In late September, she had seen vascular and hand surgeons. since then she's been in [place] at the rec center for surgery involving extensive vascular procedures for the upper extremities. She was then at [facility] with GI bleeds both upper and lower and has been in and out of hospital for that. She has recently been out of hospital for the last week. 09/09/2015 -- she was unable to get here in time to start her hyperbaric oxygen today and hence is only here for a wound care visit. 09/19/2015 -- she has been having vancomycin during her dialysis and continues to have vascular appointments and the procedure is been set for early January. She has been unable to make it for her hemodialysis due to various medical symptoms. 09/30/2014 -- her vancomycin was stopped on 09/25/2015 and the mother has noticed the right foot has started draining for the last 3 days. Addendum: after examining the patient I was able to talk to her primary vascular surgeon Dr. Pernell Dupre at the [facility]. I told him about the necrotic area on the plantar aspect of right foot which is now wet gangrene and he agreed with me that he would admit her at [facility] under her care and synchronize further treatment. We have discussed her poor prognosis and he and I discussed the need for hospice care and for sitting down and talking to the patient and her mother and giving them a proper detail of the prognosis. 11/29/2014 -- She was admitted to the [facility] on January 3 and discharged on January 25 and had Fox, SHANIYA TASHIRO. 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 She was worked up with MRIs, abdomen aortogram and placement of left-sided angioplasties were done. After a prolonged hospital she she was discharged home and was told to wear shrinker sock and stump protector and see her surgeon for further instructions regarding wound care and suture removal. Asked to take long-term doxycycline. Electronic Signature(s) Signed: 12/30/2015 3:58:31 PM By: [name] MD, FACS Entered By: [name] on 12/30/2015 15:58:31 [name] ([id number]) -------------------------------------------------------------------------------- Physical Exam  Details Patient Name: ODILE, VELOSO. Date of Service: 12/30/2015 3:45 PM Medical Record Number: [id number] Patient Account Number: [id number] Date of Birth/Sex: [birth year] (34 y.o. Female) Treating RN: [name] Primary Care Physician: [name] Other Clinician: Referring Physician: [name] Treating Physician/Extender: Rudene Re in Treatment: 17 Constitutional . Pulse regular. Respirations normal and unlabored. Afebrile. . Eyes Nonicteric. Reactive to light. Ears, Nose, Mouth, and Throat Lips, teeth, and gums WNL.Marland Kitchen Moist mucosa without lesions. Neck supple and nontender. No palpable supraclavicular or cervical adenopathy. Normal sized without goiter. Respiratory WNL. No retractions.. Cardiovascular Pedal Pulses WNL. No clubbing, cyanosis or edema. Lymphatic No adneopathy. No adenopathy. No adenopathy. Musculoskeletal Adexa without tenderness or enlargement.. Digits and nails w/o clubbing, cyanosis, infection, petechiae, ischemia, or inflammatory conditions.. Integumentary (Hair, Skin) No suspicious lesions. No crepitus or fluctuance. No peri-wound warmth or erythema. No masses.Marland Kitchen Psychiatric Judgement and insight Intact.. No evidence of depression, anxiety, or agitation.. Notes The left forefoot has dry gangrene of the  first and second toe and the plantar aspect of her forefoot and almost the entire heel. Some of the eschar was separating today and sharp dissection was done. minimal bleeding which was brisk was controlled with Silver nitrate.There is a minimal wet area at the base of the big toe and in the web space of the first and second toe. No evidence of wet gangrene. Electronic Signature(s) Signed: 12/30/2015 4:17:37 PM By: [name] MD, FACS Entered By: [name] on 12/30/2015 16:17:36 [name] ([id number]) -------------------------------------------------------------------------------- Physician Orders Details Patient Name: [name] Date of Service: 12/30/2015 3:45 PM Medical Record Number: [id number] Patient Account Number: [id number] Date of Birth/Sex: 07/18/1981 (34 y.o. Female) Treating RN: [name] Primary Care Physician: [name] Other Clinician: Referring Physician: [name] Treating Physician/Extender: Rudene Re in Treatment: 56 Verbal / Phone Orders: Yes Clinician: [name] Read Back and Verified: Yes Diagnosis Coding ICD-10 Coding Code Description E10.621 Type 1 diabetes mellitus with foot ulcer E10.52 Type 1 diabetes mellitus with diabetic peripheral angiopathy with gangrene I70.245 Atherosclerosis of native arteries of left leg with ulceration of other part of foot I70.261 Atherosclerosis of native arteries of extremities with gangrene, right leg L89.622 Pressure ulcer of left heel, stage 2 Z89.511 Acquired absence of right leg below knee Wound Cleansing Wound #12 Left,Plantar,Circumferential Foot o Cleanse wound with mild soap and water Wound #9 Left Calcaneous o Cleanse wound with mild soap and water Primary Wound Dressing Wound #12 Left,Plantar,Circumferential Foot o Aquacel Ag - to wet areas ****PLEASE ORDER THE 10CM / 4IN X 5 IN SQUARES ****** o Other: - betadine STICKS paint to dry areas ****PLEASE  ORDER SOME FOR PT***** Wound #9 Left Calcaneous o Aquacel Ag - to wet areas ****PLEASE ORDER THE 10CM / 4IN X 5 IN SQUARES ****** o Other: - betadine STICKS paint to dry areas ****PLEASE ORDER SOME FOR PT***** Secondary Dressing Wound #12 Left,Plantar,Circumferential Foot o ABD pad o Gauze and Kerlix/Conform Wound #9 Left Calcaneous o ABD pad o Gauze and Kerlix/Conform [place], Amori N. ([id number]) Dressing Change Frequency Wound #12 Left,Plantar,Circumferential Foot o Change dressing every day. Wound #9 Left Calcaneous o Change dressing every day. Follow-up Appointments Wound #12 Left,Plantar,Circumferential Foot o Other: - as patient is able Wound #9 Left Calcaneous o Other: - as patient is able Off-Loading Wound #12 Left,Plantar,Circumferential Foot o Open toe surgical shoe with peg assist. Wound #9 Left Calcaneous o Open toe surgical shoe with peg assist. Home Health Wound #12 Left,Plantar,Circumferential Foot o Continue Home  Health Visits - [facility] - only for PT and wound care supplies o Home Health Nurse may visit PRN to address patientos wound care needs. o FACE TO FACE ENCOUNTER: MEDICARE and MEDICAID PATIENTS: I certify that this patient is under my care and that I had a face-to-face encounter that meets the physician face-to-face encounter requirements with this patient on this date. The encounter with the patient was in whole or in part for the following MEDICAL CONDITION: (primary reason for Home Healthcare) MEDICAL NECESSITY: I certify, that based on my findings, NURSING services are a medically necessary home health service. HOME BOUND STATUS: I certify that my clinical findings support that this patient is homebound (i.e., Due to illness or injury, pt requires aid of supportive devices such as crutches, cane, wheelchairs, walkers, the use of special transportation or the assistance of another person to leave their place of  residence. There is a normal inability to leave the home and doing so requires considerable and taxing effort. Other absences are for medical reasons / religious services and are infrequent or of short duration when for other reasons). o If current dressing causes regression in wound condition, may D/C ordered dressing product/s and apply Normal Saline Moist Dressing daily until next Wound Healing Center / Other MD appointment. Notify Wound Healing Center of regression in wound condition at [phone]. o Please direct any NON-WOUND related issues/requests for orders to patient's Primary Care Physician Wound #9 Left Calcaneous o Continue Home Health Visits - [facility] - only for PT and wound care supplies o Home Health Nurse may visit PRN to address patientos wound care needs. o FACE TO FACE ENCOUNTER: MEDICARE and MEDICAID PATIENTS: I certify that this patient is under my care and that I had a face-to-face encounter that meets the physician face-to-face ILIYANA, CONVEY ([id number]) encounter requirements with this patient on this date. The encounter with the patient was in whole or in part for the following MEDICAL CONDITION: (primary reason for Home Healthcare) MEDICAL NECESSITY: I certify, that based on my findings, NURSING services are a medically necessary home health service. HOME BOUND STATUS: I certify that my clinical findings support that this patient is homebound (i.e., Due to illness or injury, pt requires aid of supportive devices such as crutches, cane, wheelchairs, walkers, the use of special transportation or the assistance of another person to leave their place of residence. There is a normal inability to leave the home and doing so requires considerable and taxing effort. Other absences are for medical reasons / religious services and are infrequent or of short duration when for other reasons). o If current dressing causes regression in wound condition, may  D/C ordered dressing product/s and apply Normal Saline Moist Dressing daily until next Wound Healing Center / Other MD appointment. Notify Wound Healing Center of regression in wound condition at [phone]. o Please direct any NON-WOUND related issues/requests for orders to patient's Primary Care Physician Electronic Signature(s) Signed: 12/30/2015 4:30:18 PM By: [name] MD, FACS Signed: 12/30/2015 5:22:46 PM By: [name] Entered By: [name] on 12/30/2015 16:24:43 [name] ([id number]) -------------------------------------------------------------------------------- Problem List Details Patient Name: [name] Date of Service: 12/30/2015 3:45 PM Medical Record Number: [id number] Patient Account Number: [id number] Date of Birth/Sex: [birth year] (34 y.o. Female) Treating RN: [name] Primary Care Physician: [name] Other Clinician: Referring Physician: [name] Treating Physician/Extender: Rudene Re in Treatment: 28 Active Problems ICD-10 Encounter Code Description Active Date Diagnosis E10.621 Type 1 diabetes mellitus with foot ulcer  08/30/2015 Yes E10.52 Type 1 diabetes mellitus with diabetic peripheral 08/30/2015 Yes angiopathy with gangrene I70.245 Atherosclerosis of native arteries of left leg with ulceration 08/30/2015 Yes of other part of foot I70.261 Atherosclerosis of native arteries of extremities with 08/30/2015 Yes gangrene, right leg L89.622 Pressure ulcer of left heel, stage 2 08/30/2015 Yes Z89.511 Acquired absence of right leg below knee 11/29/2015 Yes Inactive Problems Resolved Problems ICD-10 Code Description Active Date Resolved Date L02.611 Cutaneous abscess of right foot 10/01/2015 10/01/2015 Electronic Signature(s) Signed: 12/30/2015 3:57:59 PM By: [name] MD, FACS [place], Ledell Peoples ([id number]) Entered By: [name] on 12/30/2015 15:57:58 [name]  ([id number]) -------------------------------------------------------------------------------- Progress Note Details Patient Name: [name] Date of Service: 12/30/2015 3:45 PM Medical Record Number: [id number] Patient Account Number: [id number] Date of Birth/Sex: [birth year] (34 y.o. Female) Treating RN: [name] Primary Care Physician: [name] Other Clinician: Referring Physician: [name] Treating Physician/Extender: Rudene Re in Treatment: 17 Subjective Chief Complaint Information obtained from Patient Patient in today for treatment of non-healing wound and HBO Treatment. she has just gotten out of hospital this week and is back to resume her hyperbaric oxygen therapy History of Present Illness (HPI) The following HPI elements were documented for the patient's wound: Location: dry gangrene both feet and heels Quality: Patient reports No Pain. Severity: Patient states wound are getting worse. Duration: Patient has had the wound for > 4months prior to seeking treatment at the wound center Context: The wound appeared gradually over time Modifying Factors: she has been in and out of hospital over the last 2 months Associated Signs and Symptoms: Patient reports having difficulty standing for long periods. Diasia Henken is a 35 y.o. female who presents to our wound center, back in June 2016, referred by her PCP Dr. Zada Finders for nonhealing ulcers on the lateral aspect of the right heel. Of note she has a history of type 1 diabetes mellitus that has been uncontrolled. Past medical history significant for type 1 diabetes mellitus not controlled, ankylosing spondylitis, anorexia nervosa, irritable bowel syndrome, chronic kidney disease, chronic diarrhea. she then developed gangrene of both feet due to severe peripheral vascular disease and also had gangrene of the tips of her fingers due to upper extremity vascular disease. She was being worked up  by vascular surgery at [facility] and at [facility] and has had several procedures done there. She started with hyperbaric oxygen therapy and had a total of 40 treatments the last one being on 06/20/2015. After the initial treatment of hyperbaric oxygen therapy she started having ear problems and had ultimately to use myringotomy tubes and this was done bilaterally. Since then her ears have been doing fine. In late September, she had seen vascular and hand surgeons. since then she's been in [place] at the rec center for surgery involving extensive vascular procedures for the upper extremities. She was then at [facility] with GI bleeds both upper and lower and has been in and out of hospital for that. She has recently been out of hospital for the last week. 09/09/2015 -- she was unable to get here in time to start her hyperbaric oxygen today and hence is only here for a wound care visit. 09/19/2015 -- she has been having vancomycin during her dialysis and continues to have vascular appointments and the procedure is been set for early January. She has been unable to make it for her hemodialysis due to various medical symptoms. 09/30/2014 -- her vancomycin was stopped on 09/25/2015 and the mother  has noticed the right foot has Raver, Aurie N. ([id number]) started draining for the last 3 days. Addendum: after examining the patient I was able to talk to her primary vascular surgeon Dr. Pernell Dupre at the [facility]. I told him about the necrotic area on the plantar aspect of right foot which is now wet gangrene and he agreed with me that he would admit her at [facility] under her care and synchronize further treatment. We have discussed her poor prognosis and he and I discussed the need for hospice care and for sitting down and talking to the patient and her mother and giving them a proper detail of the prognosis. 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 She was worked up with MRIs, abdomen aortogram and placement of left-sided angioplasties were done. After a prolonged hospital she she was discharged home and was told to wear shrinker sock and stump protector and see her surgeon for further instructions regarding wound care and suture removal. Asked to take long-term doxycycline. Objective Constitutional Pulse regular. Respirations normal and unlabored. Afebrile. Vitals Time Taken: 3:50 PM, Height: 68 in, Weight: 96 lbs, BMI: 14.6, Temperature: 97.9 F, Pulse: 82 bpm, Respiratory Rate: 18 breaths/min, Blood Pressure: 108/78 mmHg. Eyes Nonicteric. Reactive to light. Ears, Nose, Mouth, and Throat Lips, teeth, and gums WNL.Marland Kitchen Moist mucosa without lesions. Neck supple and nontender. No palpable supraclavicular or cervical adenopathy. Normal sized without goiter. Respiratory WNL. No retractions.. Cardiovascular Pedal Pulses WNL. No clubbing, cyanosis or edema. Lymphatic No adneopathy. No adenopathy. No adenopathy. Musculoskeletal YULI, LANIGAN N. ([id number]) Adexa without tenderness or enlargement.. Digits and nails w/o clubbing, cyanosis, infection, petechiae, ischemia, or inflammatory conditions.Marland Kitchen Psychiatric Judgement and insight Intact.. No evidence of depression, anxiety, or agitation.. General Notes: The left forefoot has dry gangrene of the first and second toe and the plantar aspect of her forefoot and almost the entire heel. Some of the eschar was separating today and sharp dissection was done. minimal bleeding which was brisk was controlled with Silver nitrate.There is a  minimal wet area at the base of the big toe and in the web space of the first and second toe. No evidence of wet gangrene. Integumentary (Hair, Skin) No suspicious lesions. No crepitus or fluctuance. No peri-wound warmth or erythema. No masses.. Wound #12 status is Open. Original cause of wound was Gradually Appeared. The wound is located on the Left,Plantar,Circumferential Foot. The wound measures 8.7cm length x 7.2cm width x 0.2cm depth; 49.197cm^2 area and 9.839cm^3 volume. The wound is limited to skin breakdown. There is no tunneling or undermining noted. There is a medium amount of serous drainage noted. The wound margin is indistinct and nonvisible. There is small (1-33%) red granulation within the wound bed. There is a large (67-100%) amount of necrotic tissue within the wound bed including Eschar and Adherent Slough. The periwound skin appearance exhibited: Localized Edema, Moist. The periwound skin appearance did not exhibit: Callus, Crepitus, Excoriation, Fluctuance, Friable, Induration, Rash, Scarring, Dry/Scaly, Maceration, Atrophie Blanche, Cyanosis, Ecchymosis, Hemosiderin Staining, Mottled, Pallor, Rubor, Erythema. Periwound temperature was noted as No Abnormality. Wound #9 status is Open. Original cause of wound was Pressure Injury. The  wound is located on the Left Calcaneous. The wound measures 4cm length x 6cm width x 0.2cm depth; 18.85cm^2 area and 3.77cm^3 volume. The wound is limited to skin breakdown. There is no tunneling or undermining noted. There is a small amount of serous drainage noted. The wound margin is distinct with the outline attached to the wound base. There is small (1-33%) pink granulation within the wound bed. There is a large (67-100%) amount of necrotic tissue within the wound bed including Eschar and Adherent Slough. The periwound skin appearance exhibited: Moist. The periwound skin appearance did not exhibit: Callus, Crepitus, Excoriation, Fluctuance,  Friable, Induration, Localized Edema, Rash, Scarring, Dry/Scaly, Maceration, Atrophie Blanche, Cyanosis, Ecchymosis, Hemosiderin Staining, Mottled, Pallor, Rubor, Erythema. Periwound temperature was noted as No Abnormality. The periwound has tenderness on palpation. 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 There was a Skin/Dermis Open Wound/Selective [phone]) debridement with total area of 6 sq cm performed by [name], MD. with the following instrument(s): Forceps and Scissors to remove Non- Viable tissue/material including Eschar and Skin after achieving pain control using Lidocaine 5% topical ointment. A time out was conducted prior to the start of the procedure. A Minimum amount of bleeding was controlled with Pressure. The procedure was tolerated well with a pain level of 0 throughout and a pain level of 0 following the procedure. Post Debridement Measurements: 8.7cm length x 7.2cm width x 0.2cm depth; 9.839cm^3 volume. Post procedure Diagnosis Wound #12: Same as Pre-Procedure Wound #9 Wound #9 is a Diabetic Wound/Ulcer of the Lower Extremity located on the Left Calcaneous . There was a Skin/Dermis Open Wound/Selective [phone]) debridement with total area of 3 sq cm performed by [name], MD. with the following instrument(s): Forceps and Scissors to remove Non-Viable tissue/material including  Fibrin/Slough, Eschar, and Skin after achieving pain control using Lidocaine 5% topical ointment. A time out was conducted prior to the start of the procedure. A Minimum amount of bleeding was controlled with Silver Nitrate. The procedure was tolerated well with a pain level of 0 throughout and a pain level of 0 following the procedure. Post Debridement Measurements: 4cm length x 6cm width x 0.2cm depth; 3.77cm^3 volume. Post procedure Diagnosis Wound #9: Same as Pre-Procedure Plan Wound Cleansing: Wound #12 Left,Plantar,Circumferential Foot: Cleanse wound with mild soap and water Wound #9 Left Calcaneous: Cleanse wound with mild soap and water Primary Wound Dressing: Wound #12 Left,Plantar,Circumferential Foot: Aquacel Ag - to wet areas ****PLEASE ORDER THE 10CM / 4IN X 5 IN SQUARES ****** Other: - betadine STICKS paint to dry areas ****PLEASE ORDER SOME FOR PT***** Wound #9 Left Calcaneous: Aquacel Ag - to wet areas ****PLEASE ORDER THE 10CM / 4IN X 5 IN SQUARES ****** Capozzi, Kameran N. ([id number]) Other: - betadine STICKS paint to dry areas ****PLEASE ORDER SOME FOR PT***** Secondary Dressing: Wound #12 Left,Plantar,Circumferential Foot: ABD pad Gauze and Kerlix/Conform Wound #9 Left Calcaneous: ABD pad Gauze and Kerlix/Conform Dressing Change Frequency: Wound #12 Left,Plantar,Circumferential Foot: Change dressing every day. Wound #9 Left  Calcaneous: Change dressing every day. Follow-up Appointments: Wound #12 Left,Plantar,Circumferential Foot: Other: - as patient is able Wound #9 Left Calcaneous: Other: - as patient is able Off-Loading: Wound #12 Left,Plantar,Circumferential Foot: Open toe surgical shoe with peg assist. Wound #9 Left Calcaneous: Open toe surgical shoe with peg assist. Home Health: Wound #12 Left,Plantar,Circumferential Foot: Continue Home Health Visits - [facility] - only for PT and wound care supplies Home Health Nurse may visit PRN to address  patient s wound care needs. FACE TO FACE ENCOUNTER: MEDICARE and MEDICAID PATIENTS: I certify that this patient is under my care and that I had a face-to-face encounter that meets the physician face-to-face encounter requirements with this patient on this date. The encounter with the patient was in whole or in part for the following MEDICAL CONDITION: (primary reason for Home Healthcare) MEDICAL NECESSITY: I certify, that based on my findings, NURSING services are a medically necessary home health service. HOME BOUND STATUS: I certify that my clinical findings support that this patient is homebound (i.e., Due to illness or injury, pt requires aid of supportive devices such as crutches, cane, wheelchairs, walkers, the use of special transportation or the assistance of another person to leave their place of residence. There is a normal inability to leave the home and doing so requires considerable and taxing effort. Other absences are for medical reasons / religious services and are infrequent or of short duration when for other reasons). If current dressing causes regression in wound condition, may D/C ordered dressing product/s and apply Normal Saline Moist Dressing daily until next Wound Healing Center / Other MD appointment. Notify Wound Healing Center of regression in wound condition at [phone]. Please direct any NON-WOUND related issues/requests for orders to patient's Primary Care Physician Wound #9 Left Calcaneous: Continue Home Health Visits - [facility] - only for PT and wound care supplies Home Health Nurse may visit PRN to address patient s wound care needs. FACE TO FACE ENCOUNTER: MEDICARE and MEDICAID PATIENTS: I certify that this patient is under my care and that I had a face-to-face encounter that meets the physician face-to-face encounter requirements with this patient on this date. The encounter with the patient was in whole or in part for the following MEDICAL CONDITION:  (primary reason for Home Healthcare) MEDICAL NECESSITY: I certify, that based on my findings, NURSING services are a medically necessary home health service. HOME BOUND STATUS: I certify that my clinical findings support that this patient is homebound (i.e., Due to illness or injury, pt requires aid of supportive devices such as crutches, cane, wheelchairs, walkers, the use Mcelveen, Albertia N. ([id number]) of special transportation or the assistance of another person to leave their place of residence. There is a normal inability to leave the home and doing so requires considerable and taxing effort. Other absences are for medical reasons / religious services and are infrequent or of short duration when for other reasons). If current dressing causes regression in wound condition, may D/C ordered dressing product/s and apply Normal Saline Moist Dressing daily until next Wound Healing Center / Other MD appointment. Notify Wound Healing Center of regression in wound condition at [phone]. Please direct any NON-WOUND related issues/requests for orders to patient's Primary Care Physician Recommended application of Betadine paint to the dry eschar and in areas where debridement was done today and subcutaneous tissues visible have recommended Aquacel Ag. Discussed in great detail the signs of sepsis and wet gangrene and have told them to be vigilant about this. Due to her  various appointments she will be back to see as soon as next week if possible Electronic Signature(s) Signed: 01/01/2016 7:51:56 AM By: [name] MD, FACS Previous Signature: 12/30/2015 4:20:43 PM Version By: [name] MD, FACS Entered By: [name] on 01/01/2016 07:51:55 [name] ([id number]) -------------------------------------------------------------------------------- SuperBill Details Patient Name: [name] Date of Service: 12/30/2015 Medical Record Number: [id number] Patient Account  Number: [id number] Date of Birth/Sex: 03-02-1981 (34 y.o. 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, right leg L89.622 Pressure ulcer of left heel, stage 2 Z89.511 Acquired absence of right leg below knee Facility Procedures CPT4: Description Modifier Quantity Code [id number] - DEBRIDE WOUND 1ST 20 SQ CM OR < 1 ICD-10 Description Diagnosis E10.621 Type 1 diabetes mellitus with foot ulcer I70.245 Atherosclerosis of native arteries of left leg with ulceration of other part  of foot 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 ([id number]) Electronic Signature(s) Signed: 12/30/2015 4:21:01 PM By: [name] MD, FACS Entered By: [name] on 12/30/2015 16:21:01

## 2015-12-31 NOTE — Progress Notes (Signed)
NATALLIE, RAVENSCROFT ([id number]) Visit Report for 12/30/2015 Arrival Information Details Patient Name: [name], [name] Date of Service: 12/30/2015 3:45 PM Medical Record Number: [id number] Patient Account Number: [id number] Date of Birth/Sex: 08-14-1981 (35 y.o. Female) Treating RN: [name] Primary Care Physician: [name] Other Clinician: Referring Physician: [name] Treating Physician/Extender: Rudene Re in Treatment: 17 Visit Information History Since Last Visit Added or deleted any medications: No Patient Arrived: Wheel Chair Any new allergies or adverse reactions: No Arrival Time: 15:48 Had a fall or experienced change in No activities of daily living that may affect Accompanied By: mom risk of falls: Transfer Assistance: Manual Signs or symptoms of abuse/neglect since last No Patient Identification Verified: Yes visito Secondary Verification Process Yes Hospitalized since last visit: No Completed: Pain Present Now: No Patient Requires Transmission-Based No Precautions: Patient Has Alerts: Yes Electronic Signature(s) Signed: 12/30/2015 5:22:46 PM By: [name] Entered By: [name] on 12/30/2015 15:49:18 [name] ([id number]) -------------------------------------------------------------------------------- Encounter Discharge Information Details Patient Name: [name] Date of Service: 12/30/2015 3:45 PM Medical Record Number: [id number] Patient Account Number: [id number] Date of Birth/Sex: 02-20-1981 (35 y.o. Female) Treating RN: [name] Primary Care Physician: [name] Other Clinician: Referring Physician: [name] Treating Physician/Extender: Rudene Re in Treatment: 97 Encounter Discharge Information Items Discharge Pain Level: 0 Discharge Condition: Stable Ambulatory Status: Wheelchair Discharge Destination: Home Private Transportation: Auto Accompanied By:  mom Schedule Follow-up Appointment: Yes Medication Reconciliation completed and No provided to Patient/Care Torian Quintero: Clinical Summary of Care: Electronic Signature(s) Signed: 12/30/2015 4:40:40 PM By: [name] Entered By: [name] on 12/30/2015 16:40:40 [name] ([id number]) -------------------------------------------------------------------------------- Multi Wound Chart Details Patient Name: [name] Date of Service: 12/30/2015 3:45 PM Medical Record Number: [id number] Patient Account Number: [id number] Date of Birth/Sex: [birth year] (35 y.o. 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] [9:the Lower Extremity] Comorbid History: [12:Anemia, Type I Diabetes, Anemia, Type I Diabetes, N/A End Stage Renal Disease, End Stage Renal Disease, Rheumatoid Arthritis, Neuropathy] [9:Rheumatoid Arthritis, Neuropathy] Date Acquired: [12:07/28/2015] [9:07/28/2015] [N/A:N/A] Weeks of Treatment: [12:17] [9:17] [N/A:N/A] Wound Status: [12:Open] [9:Open] [N/A:N/A] Pending Amputation on Yes [9:No] [N/A:N/A] Presentation: Measurements L x W x D 8.7x7.2x0.2 [9:4x6x0.2] [N/A:N/A] (cm) Area (cm) : [12:49.197] [9:18.85] [N/A:N/A] Volume (cm) : [12:9.839] [9:3.77] [N/A:N/A] % Reduction in Area: [12:-13.90%] [9:-1.50%] [N/A:N/A] % Reduction in Volume:  -127.80% [9:-103.00%] [N/A:N/A] Classification: [12:Grade 2] [9:Grade 2] [N/A:N/A] Exudate Amount: [12:Medium] [9:Small] [N/A:N/A] Exudate Type: [12:Serous] [9:Serous] [N/A:N/A] Exudate Color: [12:amber] [9:amber] [N/A:N/A] Foul Odor After [12:Yes] [9:Yes] [N/A:N/A] Cleansing: Odor Anticipated Due to No [9:No] [N/A:N/A] Product Use: Wound Margin: [12:Indistinct, nonvisible] [9:Distinct, outline attached N/A] Granulation Amount: Small (1-33%) Small (1-33%) N/A Granulation Quality: Red Pink N/A Necrotic Amount: Large (67-100%) Large (67-100%) N/A Necrotic Tissue: Eschar, Adherent Slough Eschar, Adherent Slough N/A Exposed Structures: Fascia: No Fascia: No N/A Fat: No Fat: No Tendon: No Tendon: No Muscle: No Muscle: No Joint: No Joint: No Bone: No Bone: No Limited to Skin Limited to Skin Breakdown Breakdown Epithelialization: None None N/A Debridement: Open Wound/Selective Open Wound/Selective N/A ([id number]) -  Selective [phone]) - Selective Time-Out Taken: Yes Yes N/A Pain Control: Lidocaine 5% topical Lidocaine 5% topical N/A ointment ointment Tissue Debrided: Necrotic/Eschar, Skin Necrotic/Eschar, N/A Fibrin/Slough, Skin Level: Skin/Dermis Skin/Dermis N/A Debridement Area (sq 6 3 N/A cm): Instrument: Forceps, Scissors Forceps, Scissors N/A Bleeding: Minimum Minimum N/A Hemostasis Achieved: Pressure Silver Nitrate N/A Procedural Pain: 0 0 N/A Post Procedural Pain: 0 0 N/A Debridement Treatment Procedure was tolerated Procedure was tolerated N/A Response: well well Post Debridement 8.7x7.2x0.2 4x6x0.2 N/A Measurements L x W x D (cm) Post Debridement 9.839 3.77 N/A Volume: (cm) Periwound Skin Texture: Edema: Yes Edema: No N/A Excoriation: No Excoriation: No Induration: No Induration: No Callus: No Callus: No Crepitus: No Crepitus: No Fluctuance: No Fluctuance: No Friable: No Friable: No Rash: No Rash: No Scarring: No Scarring: No Periwound  Skin Moist: Yes Moist: Yes N/A Moisture: Maceration: No Maceration: No Dry/Scaly: No Dry/Scaly: No Periwound Skin Color: Atrophie Blanche: No Atrophie Blanche: No N/A Cyanosis: No Cyanosis: No Ecchymosis: No Ecchymosis: No Townsel, Maryori N. ([id number]) Erythema: No Erythema: No Hemosiderin Staining: No Hemosiderin Staining: No Mottled: No Mottled: No Pallor: No Pallor: No Rubor: No Rubor: No Temperature: No Abnormality No Abnormality N/A Tenderness on No Yes N/A Palpation: Wound Preparation: Ulcer Cleansing: Ulcer Cleansing: N/A Rinsed/Irrigated with Rinsed/Irrigated with Saline Saline Topical Anesthetic Topical Anesthetic Applied: None Applied: None Procedures Performed: Debridement Debridement N/A Treatment Notes Electronic Signature(s) Signed: 12/30/2015 4:39:02 PM By: [name] Entered By: [name] on 12/30/2015 16:39:02 [name] ([id number]) -------------------------------------------------------------------------------- Multi-Disciplinary Care Plan Details Patient Name: [name] Date of Service: 12/30/2015 3:45 PM Medical Record Number: [id number] Patient Account Number: [id number] Date of Birth/Sex: Feb 05, 1981 (34 y.o. Female) Treating RN: [name] Primary Care Physician: [name] Other Clinician: Referring Physician: [name] Treating Physician/Extender: Rudene Re in Treatment: 7 Active Inactive HBO Nursing Diagnoses: Anxiety related to feelings of confinement associated with the hyperbaric oxygen chamber Anxiety related to knowledge deficit of hyperbaric oxygen therapy and treatment procedures Discomfort related to temperature and humidity changes inside hyperbaric chamber Potential for barotraumas to ears, sinuses, teeth, and lungs or cerebral gas embolism related to changes in atmospheric pressure inside hyperbaric oxygen chamber Potential for oxygen toxicity seizures related to  delivery of 100% oxygen at an increased atmospheric pressure Potential for pulmonary oxygen toxicity related to delivery of 100% oxygen at an increased atmospheric pressure Goals: Barotrauma will be prevented during HBO2 Date Initiated: 08/30/2015 Goal Status: Active Patient and/or family will be able to state/discuss factors appropriate to the management of their disease process during treatment Date Initiated: 08/30/2015 Goal Status: Active Patient will tolerate the hyperbaric oxygen therapy treatment Date Initiated: 08/30/2015 Goal Status: Active Patient will tolerate the internal climate of the chamber Date Initiated: 08/30/2015 Goal Status: Active Patient/caregiver will verbalize understanding of HBO goals, rationale, procedures and potential hazards Date Initiated: 08/30/2015 Goal Status: Active Signs and symptoms of pulmonary oxygen toxicity will be recognized and promptly addressed Date Initiated: 08/30/2015 Goal Status: Active Signs and symptoms of seizure will be recognized and promptly addressed ; seizing patients will suffer no harm Date Initiated: 08/30/2015 RAYNELL, UPTON ([id number]) Goal Status: Active Interventions: Administer a five (5) minute air break for patient if signs and symptoms of seizure appear and notify the hyperbaric physician Administer a ten (10) minute air break for patient if signs and symptoms of seizure appear and notify the hyperbaric physician Administer decongestants, per physician orders, prior to HBO2 Administer the correct therapeutic gas delivery based on the patients  needs and limitations, per physician order Assess and provide for patientos comfort related to the hyperbaric environment and equalization of middle ear Assess for signs and symptoms related to adverse events, including but not limited to confinement anxiety, pneumothorax, oxygen toxicity and baurotrauma Assess patient for any history of confinement anxiety Assess  patient's knowledge and expectations regarding hyperbaric medicine and provide education related to the hyperbaric environment, goals of treatment and prevention of adverse events Implement protocols to decrease risk of pneumothorax in high risk patients Notes: Abuse / Safety / Falls / Self Care Management Nursing Diagnoses: Potential for falls Goals: Patient will remain injury free Date Initiated: 09/19/2015 Goal Status: Active Patient/caregiver will verbalize/demonstrate measures taken to prevent injury and/or falls Date Initiated: 09/19/2015 Goal Status: Active Interventions: Assess fall risk on admission and as needed Assess impairment of mobility on admission and as needed per policy Notes: Orientation to the Wound Care Program Nursing Diagnoses: Knowledge deficit related to the wound healing center program Goals: Patient/caregiver will verbalize understanding of the Wound Healing Center Program [place] ([id number]) Date Initiated: 08/30/2015 Goal Status: Active Interventions: Provide education on orientation to the wound center Notes: Pressure Nursing Diagnoses: Knowledge deficit related to causes and risk factors for pressure ulcer development Knowledge deficit related to management of pressures ulcers Potential for impaired tissue integrity related to pressure, friction, moisture, and shear Goals: Patient will remain free from development of additional pressure ulcers Date Initiated: 08/30/2015 Goal Status: Active Patient will remain free of pressure ulcers Date Initiated: 08/30/2015 Goal Status: Active Patient/caregiver will verbalize risk factors for pressure ulcer development Date Initiated: 08/30/2015 Goal Status: Active Patient/caregiver will verbalize understanding of pressure ulcer management Date Initiated: 08/30/2015 Goal Status: Active Interventions: Assess: immobility, friction, shearing, incontinence upon admission and as needed Assess  offloading mechanisms upon admission and as needed Assess potential for pressure ulcer upon admission and as needed Provide education on pressure ulcers Treatment Activities: Patient referred for home evaluation of offloading devices/mattresses : 12/30/2015 Patient referred for pressure reduction/relief devices : 12/30/2015 Pressure reduction/relief device ordered : 12/30/2015 Notes: Wound/Skin Impairment Nursing Diagnoses: Impaired tissue integrity Langenberg, Shatima N. ([id number]) Knowledge deficit related to ulceration/compromised skin integrity Goals: Patient will have a decrease in wound volume by X% from date: (specify in notes) Date Initiated: 08/30/2015 Goal Status: Active Patient/caregiver will verbalize understanding of skin care regimen Date Initiated: 08/30/2015 Goal Status: Active Ulcer/skin breakdown will have a volume reduction of 30% by week 4 Date Initiated: 08/30/2015 Goal Status: Active Ulcer/skin breakdown will have a volume reduction of 50% by week 8 Date Initiated: 08/30/2015 Goal Status: Active Ulcer/skin breakdown will have a volume reduction of 80% by week 12 Date Initiated: 08/30/2015 Goal Status: Active Ulcer/skin breakdown will heal within 14 weeks Date Initiated: 08/30/2015 Goal Status: Active Interventions: Assess patient/caregiver ability to obtain necessary supplies Assess patient/caregiver ability to perform ulcer/skin care regimen upon admission and as needed Assess ulceration(s) every visit Provide education on ulcer and skin care Notes: Electronic Signature(s) Signed: 12/30/2015 4:38:52 PM By: [name] Entered By: [name] on 12/30/2015 16:38:52 [name] ([id number]) -------------------------------------------------------------------------------- Patient/Caregiver Education Details Patient Name: [name] Date of Service: 12/30/2015 3:45 PM Medical Record Number: [id number] Patient Account Number: [id number] Date of  Birth/Gender: 01-13-1981 (35 y.o. Female) Treating RN: [name] Primary Care Physician: [name] Other Clinician: Referring Physician: [name] Treating Physician/Extender: Rudene Re in Treatment: 17 Education Assessment Education Provided To: Patient and Caregiver Education Topics Provided Venous: Handouts: Other: leg elevation to  manage edema Methods: Explain/Verbal Responses: State content correctly Wound/Skin Impairment: Handouts: Other: continue wound care as ordered Methods: Demonstration, Explain/Verbal Responses: State content correctly Electronic Signature(s) Signed: 12/30/2015 4:41:17 PM By: [name] Entered By: [name] on 12/30/2015 16:41:17 [name] ([id number]) -------------------------------------------------------------------------------- Wound Assessment Details Patient Name: [name] Date of Service: 12/30/2015 3:45 PM Medical Record Number: [id number] Patient Account Number: [id number] Date of Birth/Sex: 03-29-1981 (34 y.o. Female) Treating RN: [name] Primary Care Physician: [name] Other Clinician: Referring Physician: [name] Treating Physician/Extender: Rudene Re in Treatment: 17 Wound Status Wound Number: 12 Primary Diabetic Wound/Ulcer of the Lower Etiology: Extremity Wound Location: Left Foot - Plantar, Circumfernential Wound Open Status: Wounding Event: Gradually Appeared Comorbid Anemia, Type I Diabetes, End Stage Date Acquired: 07/28/2015 History: Renal Disease, Rheumatoid Arthritis, Weeks Of Treatment: 17 Neuropathy Clustered Wound: No Pending Amputation On Presentation Photos Photo Uploaded By: [name] on 12/30/2015 16:49:50 Wound Measurements Length: (cm) 8.7 Width: (cm) 7.2 Depth: (cm) 0.2 Area: (cm) 49.197 Volume: (cm) 9.839 % Reduction in Area: -13.9% % Reduction in Volume: -127.8% Epithelialization: None Tunneling:  No Undermining: No Wound Description Classification: Grade 2 Foul Odor After Wound Margin: Indistinct, nonvisible Due to Product Exudate Amount: Medium Exudate Type: Serous Exudate Color: amber Cleansing: Yes Use: No Wound Bed Granulation Amount: Small (1-33%) Exposed Structure Granulation Quality: Red Fascia Exposed: No Dewolfe, Arabela N. ([id number]) Necrotic Amount: Large (67-100%) Fat Layer Exposed: No Necrotic Quality: Eschar, Adherent Slough Tendon Exposed: No Muscle Exposed: No Joint Exposed: No Bone Exposed: No Limited to Skin Breakdown Periwound Skin Texture Texture Color No Abnormalities Noted: No No Abnormalities Noted: No Callus: No Atrophie Blanche: No Crepitus: No Cyanosis: No Excoriation: No Ecchymosis: No Fluctuance: No Erythema: No Friable: No Hemosiderin Staining: No Induration: No Mottled: No Localized Edema: Yes Pallor: No Rash: No Rubor: No Scarring: No Temperature / Pain Moisture Temperature: No Abnormality No Abnormalities Noted: No Dry / Scaly: No Maceration: No Moist: Yes Wound Preparation Ulcer Cleansing: Rinsed/Irrigated with Saline Topical Anesthetic Applied: None Treatment Notes Wound #12 (Left, Plantar, Circumferential Foot) 1. Cleansed with: Clean wound with Normal Saline 4. Dressing Applied: Aquacel Ag Other dressing (specify in notes) 5. Secondary Dressing Applied ABD and Kerlix/Conform 7. Secured with Tape Notes betadine to dry areas, aquacel ag to wet areas, netting Electronic Signature(s) Signed: 12/30/2015 5:22:46 PM By: [name] Entered By: [name] on 12/30/2015 16:10:49 [name] ([id number]) [place], Ledell Peoples ([id number]) -------------------------------------------------------------------------------- Wound Assessment Details Patient Name: [name] Date of Service: 12/30/2015 3:45 PM Medical Record Number: [id number] Patient Account Number: [id number] Date of  Birth/Sex: 1981/01/03 (34 y.o. Female) Treating RN: [name] Primary Care Physician: [name] Other Clinician: Referring Physician: [name] Treating Physician/Extender: Rudene Re in Treatment: 17 Wound Status Wound Number: 9 Primary Diabetic Wound/Ulcer of the Lower Etiology: Extremity Wound Location: Left Calcaneous Wound Open Wounding Event: Pressure Injury Status: Date Acquired: 07/28/2015 Comorbid Anemia, Type I Diabetes, End Stage Weeks Of Treatment: 17 History: Renal Disease, Rheumatoid Arthritis, Clustered Wound: No Neuropathy Photos Photo Uploaded By: [name] on 12/30/2015 16:50:05 Wound Measurements Length: (cm) 4 Width: (cm) 6 Depth: (cm) 0.2 Area: (cm) 18.85 Volume: (cm) 3.77 % Reduction in Area: -1.5% % Reduction in Volume: -103% Epithelialization: None Tunneling: No Undermining: No Wound Description Classification: Grade 2 Foul Odor Aft Wound Margin: Distinct, outline attached Due to Produc Exudate Amount: Small Exudate Type: Serous Exudate Color: amber er Cleansing: Yes t Use: No Wound Bed Granulation Amount: Small (  1-33%) Exposed Structure Granulation Quality: Pink Fascia Exposed: No Necrotic Amount: Large (67-100%) Fat Layer Exposed: No Necrotic Quality: Eschar, Adherent Slough Tendon Exposed: No Josten, Carrieanne N. ([id number]) Muscle Exposed: No Joint Exposed: No Bone Exposed: No Limited to Skin Breakdown Periwound Skin Texture Texture Color No Abnormalities Noted: No No Abnormalities Noted: No Callus: No Atrophie Blanche: No Crepitus: No Cyanosis: No Excoriation: No Ecchymosis: No Fluctuance: No Erythema: No Friable: No Hemosiderin Staining: No Induration: No Mottled: No Localized Edema: No Pallor: No Rash: No Rubor: No Scarring: No Temperature / Pain Moisture Temperature: No Abnormality No Abnormalities Noted: No Tenderness on Palpation: Yes Dry / Scaly: No Maceration: No Moist:  Yes Wound Preparation Ulcer Cleansing: Rinsed/Irrigated with Saline Topical Anesthetic Applied: None Treatment Notes Wound #9 (Left Calcaneous) 1. Cleansed with: Clean wound with Normal Saline 4. Dressing Applied: Aquacel Ag Other dressing (specify in notes) 5. Secondary Dressing Applied ABD and Kerlix/Conform 7. Secured with Tape Notes betadine to dry areas, aquacel ag to wet areas, netting Electronic Signature(s) Signed: 12/30/2015 5:22:46 PM By: [name] Entered By: [name] on 12/30/2015 16:11:29 [name] ([id number]) -------------------------------------------------------------------------------- Vitals Details Patient Name: [name] Date of Service: 12/30/2015 3:45 PM Medical Record Number: [id number] Patient Account Number: [id number] Date of Birth/Sex: 14-Aug-1981 (34 y.o. Female) Treating RN: [name] Primary Care Physician: [name] Other Clinician: Referring Physician: [name] Treating Physician/Extender: Rudene Re in Treatment: 17 Vital Signs Time Taken: 15:50 Temperature (F): 97.9 Height (in): 68 Pulse (bpm): 82 Weight (lbs): 96 Respiratory Rate (breaths/min): 18 Body Mass Index (BMI): 14.6 Blood Pressure (mmHg): 108/78 Reference Range: 80 - 120 mg / dl Electronic Signature(s) Signed: 12/30/2015 5:22:46 PM By: [name] Entered By: [name] on 12/30/2015 15:53:17

## 2016-01-06 ENCOUNTER — Ambulatory Visit: Payer: Medicare Other | Admitting: Surgery

## 2016-01-08 ENCOUNTER — Ambulatory Visit: Payer: Medicare Other | Admitting: Internal Medicine

## 2016-01-14 ENCOUNTER — Ambulatory Visit: Payer: Medicare Other | Admitting: Internal Medicine

## 2016-01-15 ENCOUNTER — Encounter: Payer: Medicare Other | Admitting: Internal Medicine

## 2016-01-15 DIAGNOSIS — E10621 Type 1 diabetes mellitus with foot ulcer: Secondary | ICD-10-CM | POA: Diagnosis not present

## 2016-01-16 NOTE — Progress Notes (Signed)
[name], [name] ([id number]) Visit Report for 01/15/2016 Chief Complaint Document Details [name] Date of Service: 01/15/2016 2:15 PM Patient Name: N. Patient Account Number: [id number] Medical Record Treating RN: [name] [id number] Number: Other Clinician: [birth year] (34 y.o. [name] Date of Birth/Sex: Female) Physician/Extender: G [name] Physician: Referring Physician: [name] Wu in Treatment: 41 Information Obtained from: Patient Chief Complaint Patient in today for treatment of non-healing wound and HBO Treatment. she has just gotten out of [facility] this week and is back to resume her hyperbaric oxygen therapy Electronic Signature(s) Signed: 01/15/2016 5:01:27 PM By: [name] Najjar MD Entered By: [name] on 01/15/2016 16:36:53 [name] ([id number]) -------------------------------------------------------------------------------- HPI Details [name] Date of Service: 01/15/2016 2:15 PM Patient Name: N. Patient Account Number: [id number] Medical Record Treating RN: [name] [id number] Number: Other Clinician: 1981/01/27 (34 y.o. [name] Date of Birth/Sex: Female) Physician/Extender: G [name] Physician: Referring Physician: [name] Wu in Treatment: 19 History of Present Illness Location: dry gangrene both feet and heels Quality: Patient reports No Pain. Severity: Patient states wound are getting worse. Duration: Patient has had the wound for > 74months prior to seeking treatment at the wound Wu Context: The wound appeared gradually over time Modifying Factors: she has been in and out of [facility] over the last 2 months Associated Signs and Symptoms: Patient reports having difficulty standing for long periods. HPI Description: [name] is a 35 y.o. female who presents to our wound [facility], back in June 2016,  referred by her PCP Dr. Zada Finders for nonhealing ulcers on the lateral aspect of the right heel. Of note she has a history of type 1 diabetes mellitus that has been uncontrolled. Past medical history significant for type 1 diabetes mellitus not controlled, ankylosing spondylitis, anorexia nervosa, irritable bowel syndrome, chronic kidney disease, chronic diarrhea. she then developed gangrene of both feet due to severe peripheral vascular disease and also had gangrene of the tips of her fingers due to upper extremity vascular disease. She was being worked up [name] and has had several procedures done there. She started with hyperbaric oxygen therapy and had a total of 40 treatments the last one being on 06/20/2015. After the initial treatment of hyperbaric oxygen therapy she started having ear problems and had ultimately to use myringotomy tubes and this was done bilaterally. Since then her ears have been doing fine. In late September, she had seen vascular and hand surgeons. since then she's been in [name] for surgery involving extensive vascular procedures for the upper extremities. She was then at [name] with GI bleeds both upper and lower and has been in and out of [facility] for that. She has recently been out of [facility] for the last week. 09/09/2015 -- she was unable to get here in time to start her hyperbaric oxygen today and hence is only here for a wound care visit. 09/19/2015 -- she has been having vancomycin during her dialysis and continues to have vascular appointments and the procedure is been set for early January. She has been unable to make it for her hemodialysis due to various medical symptoms. 09/30/2014 -- her vancomycin was stopped on 09/25/2015 and the mother has noticed the right foot has started draining for the last 3 days. Addendum: after examining the patient I was able to talk to her primary vascular surgeon Dr.  Pernell Dupre at the [name] I told him  about the necrotic area on the plantar aspect of right foot which is now wet gangrene and he agreed with me that he would admit her at [name] under her care and synchronize further treatment. We have discussed her poor prognosis and he and I discussed the need for hospice care [name], [name] ([id number]) and for sitting down and talking to the patient and her mother and giving them a proper detail of the prognosis. 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 She was worked up with MRIs, abdomen aortogram and placement of left-sided angioplasties were done. After a prolonged Wu she she was discharged home and was told to wear shrinker sock and stump protector and see her surgeon for further instructions regarding wound care and suture removal. Asked to take long-term doxycycline. 01/15/16; this is a patient I haven't seen before although she is been followed by Dr. Meyer Russel in this clinic today. She is a type I diabetic with severe PAD macrovascular disease. She has had a previous BKA. She has dry gangrene of the tips of her fingers which she showed me on the right to. She also has dry gangrene of the left first second and third toes and a portion of her proximal foot around these areas. She is followed by vascular surgery at [name] and saw them recently they are not going to do surgery as of yet. She has a large black eschar over her heel which is beginning to separate in some areas. As I understand think she is paining these with Betadine. There is been  some suggestion about retrying hyperbarics on her although she is still not able to commit to the frequency of treatment that would be necessary to see improvement. She is also on Monday Wednesday Friday dialysis Electronic Signature(s) Signed: 01/15/2016 5:01:27 PM By: [name] Najjar MD Entered By: [name] on 01/15/2016 16:54:32 [name] ([id number]) -------------------------------------------------------------------------------- Physical Exam Details [name] Date of Service: 01/15/2016 2:15 PM Patient Name: N. Patient Account Number: [id number] Medical Record Treating RN: [name] [id number] Number: Other Clinician: 11-04-1980 (34 y.o. Treating Sondra Blixt Date of Birth/Sex: Female) Physician/Extender: G [name] Physician: Referring Physician: [name] Wu in Treatment: 19 Cardiovascular Pedal pulses absent in the left foot however the lateral aspect of her foot is surprisingly warm.. Integumentary (Hair, Skin) She has dry gangrene of the left first second and the medial aspect of her third toes. I think there is separation between the second and third toes which may become an issue in the short-term. She has a thick black eschar over her left heel. Electronic Signature(s) Signed: 01/15/2016 5:01:27 PM By: [name] Najjar MD Entered By: [name] on 01/15/2016 16:56:08 [name] ([id number]) -------------------------------------------------------------------------------- Physician Orders Details SHAYLYNN, NULTY 01/15/2016 2:15 Patient Name: Date of Service: N. PM Medical Record Patient Account Number: [id number] Number: Treating RN: [name] Date of Birth/Sex: [birth year] (34 y.o. Female) Other Clinician: Primary Care Physician: [name] Treating [name] Referring Physician: Rolin Wu Physician/Extender: Tania Ade in Treatment: 87 Verbal / Phone Orders:  Yes Clinician: Curtis Wu Read Back and Verified: Yes Diagnosis Coding Wound Cleansing Wound #12 Left,Plantar,Circumferential  Foot o Cleanse wound with mild soap and water Wound #9 Left Calcaneous o Cleanse wound with mild soap and water Primary Wound Dressing Wound #12 Left,Plantar,Circumferential Foot o Aquacel Ag - to wet areas ****PLEASE ORDER THE 10CM / 4IN X 5 IN SQUARES ****** o Other: - betadine STICKS paint to dry areas ****PLEASE ORDER SOME FOR PT***** Wound #9 Left Calcaneous o Aquacel Ag - to wet areas ****PLEASE ORDER THE 10CM / 4IN X 5 IN SQUARES ****** o Other: - betadine STICKS paint to dry areas ****PLEASE ORDER SOME FOR PT***** o Aquacel Ag - to wet areas ****PLEASE ORDER THE 10CM / 4IN X 5 IN SQUARES ****** o Other: - betadine STICKS paint to dry areas ****PLEASE ORDER SOME FOR PT***** Secondary Dressing Wound #12 Left,Plantar,Circumferential Foot o ABD pad o Gauze and Kerlix/Conform Wound #9 Left Calcaneous o ABD pad o Gauze and Kerlix/Conform Dressing Change Frequency Wound #12 Left,Plantar,Circumferential Foot o Change dressing every day. Wound #9 Left Calcaneous [name], [name] ([id number]) o Change dressing every day. Follow-up Appointments Wound #12 Left,Plantar,Circumferential Foot o Other: - as patient is able Wound #9 Left Calcaneous o Other: - as patient is able Off-Loading Wound #12 Left,Plantar,Circumferential Foot o Open toe surgical shoe with peg assist. Wound #9 Left Calcaneous o Open toe surgical shoe with peg assist. Home Health Wound #12 Left,Plantar,Circumferential Foot o Continue Home Health Visits - [facility] - only for PT and wound care supplies o Home Health Nurse may visit PRN to address patientos wound care needs. o FACE TO FACE ENCOUNTER: MEDICARE and MEDICAID PATIENTS: I certify that this patient is under my care and that I had a face-to-face encounter that meets the  physician face-to-face encounter requirements with this patient on this date. The encounter with the patient was in whole or in part for the following MEDICAL CONDITION: (primary reason for Home Healthcare) MEDICAL NECESSITY: I certify, that based on my findings, NURSING services are a medically necessary home health service. HOME BOUND STATUS: I certify that my clinical findings support that this patient is homebound (i.e., Due to illness or injury, pt requires aid of supportive devices such as crutches, cane, wheelchairs, walkers, the use of special transportation or the assistance of another person to leave their place of residence. There is a normal inability to leave the home and doing so requires considerable and taxing effort. Other absences are for medical reasons / religious services and are infrequent or of short duration when for other reasons). o If current dressing causes regression in wound condition, may D/C ordered dressing product/s and apply Normal Saline Moist Dressing daily until next Wound Healing [facility] / Other MD appointment. Notify Wound Healing [facility] of regression in wound condition at [phone]. o Please direct any NON-WOUND related issues/requests for orders to patient's Primary Care Physician Wound #9 Left Calcaneous o Continue Home Health Visits - [facility] - only for PT and wound care supplies o Home Health Nurse may visit PRN to address patientos wound care needs. o FACE TO FACE ENCOUNTER: MEDICARE and MEDICAID PATIENTS: I certify that this patient is under my care and that I had a face-to-face encounter that meets the physician face-to-face encounter requirements with this patient on this date. The encounter with the patient was in whole or in part for the following MEDICAL CONDITION: (primary reason for Home Healthcare) MEDICAL NECESSITY: I certify, that based on my findings, NURSING services are a medically necessary home health service. HOME  BOUND STATUS: I certify that my clinical findings support  that this patient is homebound (i.e., Due to illness or injury, pt requires aid of [name], [name] ([id number]) supportive devices such as crutches, cane, wheelchairs, walkers, the use of special transportation or the assistance of another person to leave their place of residence. There is a normal inability to leave the home and doing so requires considerable and taxing effort. Other absences are for medical reasons / religious services and are infrequent or of short duration when for other reasons). o If current dressing causes regression in wound condition, may D/C ordered dressing product/s and apply Normal Saline Moist Dressing daily until next Wound Healing [facility] / Other MD appointment. Notify Wound Healing [facility] of regression in wound condition at [phone]. o Please direct any NON-WOUND related issues/requests for orders to patient's Primary Care Physician Electronic Signature(s) Signed: 01/15/2016 4:40:51 PM By: [name] Signed: 01/16/2016 4:14:45 PM By: [name] MD, FACS Entered By: [name] on 01/15/2016 15:10:28 [name] ([id number]) -------------------------------------------------------------------------------- Problem List Details [name] Date of Service: 01/15/2016 2:15 PM Patient Name: N. Patient Account Number: [id number] Medical Record Treating RN: [name] [id number] Number: Other Clinician: 1980-09-29 (34 y.o. Treating Latysha Thackston Date of Birth/Sex: Female) Physician/Extender: G Primary [name] Physician: Referring Physician: [name] Wu in Treatment: 97 Active Problems ICD-10 Encounter Code Description Active Date Diagnosis E10.621 Type 1 diabetes mellitus with foot ulcer 08/30/2015 Yes E10.52 Type 1 diabetes mellitus with diabetic peripheral 08/30/2015 Yes angiopathy with gangrene I70.245 Atherosclerosis of native  arteries of left leg with ulceration 08/30/2015 Yes of other part of foot I70.261 Atherosclerosis of native arteries of extremities with 08/30/2015 Yes gangrene, right leg L89.622 Pressure ulcer of left heel, stage 2 08/30/2015 Yes Z89.511 Acquired absence of right leg below knee 11/29/2015 Yes Inactive Problems Resolved Problems ICD-10 Code Description Active Date Resolved Date L02.611 Cutaneous abscess of right foot 10/01/2015 10/01/2015 [name] ([id number]) Electronic Signature(s) Signed: 01/15/2016 5:01:27 PM By: [name] Najjar MD Entered By: [name] on 01/15/2016 16:36:41 [name] ([id number]) -------------------------------------------------------------------------------- Progress Note Details [name] Date of Service: 01/15/2016 2:15 PM Patient Name: N. Patient Account Number: [id number] Medical Record Treating RN: [name] [id number] Number: Other Clinician: 31-Jul-1981 (34 y.o. Treating Jayce Kainz Date of Birth/Sex: Female) Physician/Extender: G Primary [name] Physician: Referring Physician: [name] Wu in Treatment: 60 Subjective Chief Complaint Information obtained from Patient Patient in today for treatment of non-healing wound and HBO Treatment. she has just gotten out of [facility] this week and is back to resume her hyperbaric oxygen therapy History of Present Illness (HPI) The following HPI elements were documented for the patient's wound: Location: dry gangrene both feet and heels Quality: Patient reports No Pain. Severity: Patient states wound are getting worse. Duration: Patient has had the wound for > 36months prior to seeking treatment at the wound Wu Context: The wound appeared gradually over time Modifying Factors: she has been in and out of [facility] over the last 2 months Associated Signs and Symptoms: Patient reports having difficulty standing for long periods. Samon Ptacek is a  35 y.o. female who presents to our wound [facility], back in June 2016, referred by her PCP Dr. Zada Finders for nonhealing ulcers on the lateral aspect of the right heel. Of note she has a history of type 1 diabetes mellitus that has been uncontrolled. Past medical history significant for type 1 diabetes mellitus not controlled, ankylosing spondylitis, anorexia nervosa, irritable bowel syndrome, chronic kidney disease, chronic diarrhea. she then developed gangrene of both  feet due to severe peripheral vascular disease and also had gangrene of the tips of her fingers due to upper extremity vascular disease. She was being worked up by vascular surgery at [facility] and at [facility] and has had several procedures done there. She started with hyperbaric oxygen therapy and had a total of 40 treatments the last one being on 06/20/2015. After the initial treatment of hyperbaric oxygen therapy she started having ear problems and had ultimately to use myringotomy tubes and this was done bilaterally. Since then her ears have been doing fine. In late September, she had seen vascular and hand surgeons. since then she's been in [place] at the rec [facility] for surgery involving extensive vascular procedures for the upper extremities. She was then at [facility] with GI bleeds both upper and lower and has been in and out of Wu for that. She has recently been out of Wu for the last week. 09/09/2015 -- she was unable to get here in time to start her hyperbaric oxygen today and hence is only here for a wound care visit. 09/19/2015 -- she has been having vancomycin during her dialysis and continues to have vascular [name] ([id number]) appointments and the procedure is been set for early January. She has been unable to make it for her hemodialysis due to various medical symptoms. 09/30/2014 -- her vancomycin was stopped on 09/25/2015 and the mother has noticed the right foot has started draining for the  last 3 days. Addendum: after examining the patient I was able to talk to her primary vascular surgeon Dr. Pernell Dupre at the [name] I told him about the necrotic area on the plantar aspect of right foot which is now wet gangrene and he agreed with me that he would admit her at [facility] under her care and synchronize further treatment. We have discussed her poor prognosis and he and I discussed the need for hospice care and for sitting down and talking to the patient and her mother and giving them a proper detail of the prognosis. 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 She was worked up with MRIs, abdomen aortogram and placement of left-sided angioplasties were done. After a prolonged Wu she she was discharged home and was told to wear shrinker sock and stump protector and see her surgeon for further instructions regarding wound care and suture removal. Asked to take long-term doxycycline. 01/15/16; this is a patient I haven't seen before although she is been followed by Dr. Meyer Russel in this clinic today. She is a type I diabetic with severe PAD macrovascular disease. She has had a previous BKA. She has dry gangrene of the tips of her fingers which she showed me on the right to. She also has dry gangrene of the left first second and third toes and a portion of her proximal foot around these areas. She is followed by vascular surgery at [name] and saw them recently they are not going to do surgery as of yet. She has a large black eschar over her heel which is beginning to separate in some areas.  As I understand think she is paining these with Betadine. There  is been some suggestion about retrying hyperbarics on her although she is still not able to commit to the frequency of treatment that would be necessary to see improvement. She is also on Monday Wednesday Friday dialysis Objective Constitutional Vitals Time Taken: 2:38 PM, Height: 68 in, Weight: 96 lbs, BMI: 14.6, Temperature: 98.2 F, Pulse: 72 bpm, Respiratory Rate: 16 breaths/min, Blood Pressure: 149/89 mmHg. Cardiovascular Pedal pulses absent in the left foot however the lateral aspect of her foot is surprisingly warm.. Integumentary (Hair, Skin) She has dry gangrene of the left first second and the medial aspect of her third toes. I think there is separation between the second and third toes which may become an issue in the short-term. She has a [name], [name] ([id number]) thick black eschar over her left heel. Wound #12 status is Open. Original cause of wound was Gradually Appeared. The wound is located on the Left,Plantar,Circumferential Foot. The wound measures 8.8cm length x 6.8cm width x 0.2cm depth; 46.998cm^2 area and 9.4cm^3 volume. The wound is limited to skin breakdown. There is no tunneling or undermining noted. There is a medium amount of serous drainage noted. The wound margin is indistinct and nonvisible. There is small (1-33%) red granulation within the wound bed. There is a large (67-100%) amount of necrotic tissue within the wound bed including Eschar and Adherent Slough. The periwound skin appearance exhibited: Localized Edema, Moist. The periwound skin appearance did not exhibit: Callus, Crepitus, Excoriation, Fluctuance, Friable, Induration, Rash, Scarring, Dry/Scaly, Maceration, Atrophie Blanche, Cyanosis, Ecchymosis, Hemosiderin Staining, Mottled, Pallor, Rubor, Erythema. Periwound temperature was noted as No Abnormality. Wound #9 status is Open. Original cause of wound was Pressure Injury. The  wound is located on the Left Calcaneous. The wound measures 4.3cm length x 6.6cm width x 0.2cm depth; 22.29cm^2 area and 4.458cm^3 volume. The wound is limited to skin breakdown. There is no tunneling or undermining noted. There is a small amount of serous drainage noted. The wound margin is distinct with the outline attached to the wound base. There is small (1-33%) pink granulation within the wound bed. There is a large (67-100%) amount of necrotic tissue within the wound bed including Eschar and Adherent Slough. The periwound skin appearance exhibited: Moist. The periwound skin appearance did not exhibit: Callus, Crepitus, Excoriation, Fluctuance, Friable, Induration, Localized Edema, Rash, Scarring, Dry/Scaly, Maceration, Atrophie Blanche, Cyanosis, Ecchymosis, Hemosiderin Staining, Mottled, Pallor, Rubor, Erythema. Periwound temperature was noted as No Abnormality. The periwound has tenderness on palpation. Assessment Active Problems ICD-10 E10.621 - Type 1 diabetes mellitus with foot ulcer E10.52 - Type 1 diabetes mellitus with diabetic peripheral angiopathy with gangrene I70.245 - Atherosclerosis of native arteries of left leg with ulceration of other part of foot I70.261 - Atherosclerosis of native arteries of extremities with gangrene, right leg J47.829 - Pressure ulcer of left heel, stage 2 Z89.511 - Acquired absence of right leg below knee Plan Wound Cleansing: Wound #12 Left,Plantar,Circumferential Foot: Cleanse wound with mild soap and water Wound #9 Left Calcaneous: [name], [name] ([id number]) Cleanse wound with mild soap and water Primary Wound Dressing: Wound #12 Left,Plantar,Circumferential Foot: Aquacel Ag - to wet areas ****PLEASE ORDER THE 10CM / 4IN X 5 IN SQUARES ****** Other: - betadine STICKS paint to dry areas ****PLEASE ORDER SOME FOR PT***** Wound #9 Left Calcaneous: Aquacel Ag - to wet areas ****PLEASE ORDER THE 10CM / 4IN X 5 IN SQUARES ****** Other:  - betadine STICKS paint to dry areas ****PLEASE ORDER SOME FOR PT***** Aquacel Ag - to wet areas ****  PLEASE ORDER THE 10CM / 4IN X 5 IN SQUARES ****** Other: - betadine STICKS paint to dry areas ****PLEASE ORDER SOME FOR PT***** Secondary Dressing: Wound #12 Left,Plantar,Circumferential Foot: ABD pad Gauze and Kerlix/Conform Wound #9 Left Calcaneous: ABD pad Gauze and Kerlix/Conform Dressing Change Frequency: Wound #12 Left,Plantar,Circumferential Foot: Change dressing every day. Wound #9 Left Calcaneous: Change dressing every day. Follow-up Appointments: Wound #12 Left,Plantar,Circumferential Foot: Other: - as patient is able Wound #9 Left Calcaneous: Other: - as patient is able Off-Loading: Wound #12 Left,Plantar,Circumferential Foot: Open toe surgical shoe with peg assist. Wound #9 Left Calcaneous: Open toe surgical shoe with peg assist. Home Health: Wound #12 Left,Plantar,Circumferential Foot: Continue Home Health Visits - [facility] - only for PT and wound care supplies Home Health Nurse may visit PRN to address patient s wound care needs. FACE TO FACE ENCOUNTER: MEDICARE and MEDICAID PATIENTS: I certify that this patient is under my care and that I had a face-to-face encounter that meets the physician face-to-face encounter requirements with this patient on this date. The encounter with the patient was in whole or in part for the following MEDICAL CONDITION: (primary reason for Home Healthcare) MEDICAL NECESSITY: I certify, that based on my findings, NURSING services are a medically necessary home health service. HOME BOUND STATUS: I certify that my clinical findings support that this patient is homebound (i.e., Due to illness or injury, pt requires aid of supportive devices such as crutches, cane, wheelchairs, walkers, the use of special transportation or the assistance of another person to leave their place of residence. There is a normal inability to leave the home and  doing so requires considerable and taxing effort. Other absences are for medical reasons / religious services and are infrequent or of short duration when for other reasons). If current dressing causes regression in wound condition, may D/C ordered dressing product/s and apply Normal Saline Moist Dressing daily until next Wound Healing Wu / Other MD appointment. Notify Wound Healing Wu of regression in wound condition at [phone]. Please direct any NON-WOUND related issues/requests for orders to patient's Primary Care Physician Wound #9 Left Calcaneous: LESHON, RUEGER ([id number]) Continue Home Health Visits - [facility] - only for PT and wound care supplies Home Health Nurse may visit [facility] to address patient s wound care needs. FACE TO FACE ENCOUNTER: MEDICARE and MEDICAID PATIENTS: I certify that this patient is under my care and that I had a face-to-face encounter that meets the physician face-to-face encounter requirements with this patient on this date. The encounter with the patient was in whole or in part for the following MEDICAL CONDITION: (primary reason for Home Healthcare) MEDICAL NECESSITY: I certify, that based on my findings, NURSING services are a medically necessary home health service. HOME BOUND STATUS: I certify that my clinical findings support that this patient is homebound (i.e., Due to illness or injury, pt requires aid of supportive devices such as crutches, cane, wheelchairs, walkers, the use of special transportation or the assistance of another person to leave their place of residence. There is a normal inability to leave the home and doing so requires considerable and taxing effort. Other absences are for medical reasons / religious services and are infrequent or of short duration when for other reasons). If current dressing causes regression in wound condition, may D/C ordered dressing product/s and apply Normal Saline Moist Dressing daily until next  Wound Healing Wu / Other MD appointment. Notify Wound Healing Wu of regression in wound condition at [phone]. 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 There is some separation and liquefaction I think between the second and third toes this may soon become problematic. #2 there is some separation of the eschar over the heel. No debridement was done today although if she has had all the revascularization at [name] can offer I would wonder about crosshatching the eschar and applying Santyl #3 she still cannot commit to the frequency of HBO that it would take to have any benefit here. Electronic Signature(s) Signed: 01/15/2016 5:01:27 PM By: [name] Najjar MD Entered By: [name] on 01/15/2016 16:58:30 [name] ([id number]) -------------------------------------------------------------------------------- [facility] Details [name] Date of Service: 01/15/2016 Patient Name: N. Patient Account Number: [id number] Medical Record Treating RN: [name] [id number] Number: Other Clinician: 08-Sep-1981 (34 y.o. 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

## 2016-01-16 NOTE — Progress Notes (Signed)
[name], [name] ([id number]) Visit Report for 01/15/2016 Arrival Information Details Patient Name: [name], [name] Date of Service: 01/15/2016 2:15 PM Medical Record Number: [id number] Patient Account Number: [id number] Date of Birth/Sex: 1981/07/28 (34 y.o. Female) Treating RN: [name] Primary Care Physician: [name] Other Clinician: Referring Physician: [name] Treating Physician/Extender: Rudene Re in Treatment: 20 Visit Information History Since Last Visit Added or deleted any medications: No Patient Arrived: Wheel Chair Any new allergies or adverse reactions: No Arrival Time: 14:37 Had a fall or experienced change in No activities of daily living that may affect Accompanied By: mom risk of falls: Transfer Assistance: Manual Signs or symptoms of abuse/neglect since last No Patient Identification Verified: Yes visito Secondary Verification Process Yes Hospitalized since last visit: No Completed: Pain Present Now: No Patient Requires Transmission-Based No Precautions: Patient Has Alerts: Yes Electronic Signature(s) Signed: 01/15/2016 4:40:51 PM By: [name] Entered By: [name] on 01/15/2016 14:38:05 [name] ([id number]) -------------------------------------------------------------------------------- Clinic Level of Care Assessment Details Patient Name: [name] Date of Service: 01/15/2016 2:15 PM Medical Record Number: [id number] Patient Account Number: [id number] Date of Birth/Sex: [birth year] (34 y.o. Female) Treating RN: [name] Primary Care Physician: [name] Other Clinician: Referring Physician: [name] Treating Physician/Extender: Rudene Re in Treatment: 19 Clinic Level of Care Assessment Items TOOL 4 Quantity Score []  - Use when only an EandM is performed on FOLLOW-UP visit 0 ASSESSMENTS - Nursing Assessment / Reassessment X - Reassessment of Co-morbidities  (includes updates in patient status) 1 10 X - Reassessment of Adherence to Treatment Plan 1 5 ASSESSMENTS - Wound and Skin Assessment / Reassessment []  - Simple Wound Assessment / Reassessment - one wound 0 X - Complex Wound Assessment / Reassessment - multiple wounds 2 5 []  - Dermatologic / Skin Assessment (not related to wound area) 0 ASSESSMENTS - Focused Assessment []  - Circumferential Edema Measurements - multi extremities 0 []  - Nutritional Assessment / Counseling / Intervention 0 []  - Lower Extremity Assessment (monofilament, tuning fork, pulses) 0 []  - Peripheral Arterial Disease Assessment (using hand held doppler) 0 ASSESSMENTS - Ostomy and/or Continence Assessment and Care []  - Incontinence Assessment and Management 0 []  - Ostomy Care Assessment and Management (repouching, etc.) 0 PROCESS - Coordination of Care X - Simple Patient / Family Education for ongoing care 1 15 []  - Complex (extensive) Patient / Family Education for ongoing care 0 []  - Staff obtains , Records, Test Results / Process Orders 0 []  - Staff telephones HHA, Nursing Homes / Clarify orders / etc 0 []  - Routine Transfer to another Facility (non-emergent condition) 0 Sanchez, Shaquille N. ( ) []  - Routine Hospital Admission (non-emergent condition) 0 []  - New Admissions / / Ordering NPWT, Apligraf, etc. 0 []  - Emergency Hospital Admission (emergent condition) 0 X - Simple Discharge Coordination 1 10 []  - Complex (extensive) Discharge Coordination 0 PROCESS - Special Needs []  - Pediatric / Minor Patient Management 0 []  - Isolation Patient Management 0 []  - Hearing / Language / Visual special needs 0 []  - Assessment of Community assistance (transportation, D/C planning, etc.) 0 []  - Additional assistance / Altered mentation 0 []  - Support Surface(s) Assessment (bed, cushion, seat, etc.) 0 INTERVENTIONS - Wound Cleansing / Measurement []  - Simple Wound Cleansing - one  wound 0 X - Complex Wound Cleansing - multiple wounds 2 5 X - Wound Imaging (photographs - any number of wounds) 1 5 []  - Wound Tracing (instead of photographs) 0 []  -  Simple Wound Measurement - one wound 0 X - Complex Wound Measurement - multiple wounds 2 5 INTERVENTIONS - Wound Dressings X - Small Wound Dressing one or multiple wounds 2 10 []  - Medium Wound Dressing one or multiple wounds 0 []  - Large Wound Dressing one or multiple wounds 0 []  - Application of Medications - topical 0 []  - Application of Medications - injection 0 INTERVENTIONS - Miscellaneous []  - External ear exam 0 Eyer, Meilani N. ([id number]) []  - Specimen Collection (cultures, biopsies, blood, body fluids, etc.) 0 []  - Specimen(s) / Culture(s) sent or taken to Lab for analysis 0 []  - Patient Transfer (multiple staff / Michiel Sites Lift / Similar devices) 0 []  - Simple Staple / Suture removal (25 or less) 0 []  - Complex Staple / Suture removal (26 or more) 0 []  - Hypo / Hyperglycemic Management (close monitor of Blood Glucose) 0 []  - Ankle / Brachial Index (ABI) - do not check if billed separately 0 X - Vital Signs 1 5 Has the patient been seen at the hospital within the last three years: Yes Total Score: 100 Level Of Care: New/Established - Level 3 Electronic Signature(s) Signed: 01/15/2016 3:58:49 PM By: [name] Entered By: [name] on 01/15/2016 15:58:49 [name] ([id number]) -------------------------------------------------------------------------------- Encounter Discharge Information Details Patient Name: [name] Date of Service: 01/15/2016 2:15 PM Medical Record Number: [id number] Patient Account Number: [id number] Date of Birth/Sex: [birth year] (34 y.o. Female) Treating RN: [name] Primary Care Physician: [name] Other Clinician: Referring Physician: [name] Treating Physician/Extender: Rudene Re in Treatment: 81 Encounter Discharge  Information Items Discharge Pain Level: 0 Discharge Condition: Stable Ambulatory Status: Wheelchair Discharge Destination: Home Transportation: Private Auto Accompanied By: mom Schedule Follow-up Appointment: Yes Medication Reconciliation completed and provided to Patient/Care No Maudie Shingledecker: Provided on Clinical Summary of Care: 01/15/2016 Form Type Recipient Paper Patient HB Electronic Signature(s) Signed: 01/15/2016 3:59:46 PM By: [name] Previous Signature: 01/15/2016 3:48:18 PM Version By: Gwenlyn Perking Entered By: [name] on 01/15/2016 15:59:46 [name] ([id number]) -------------------------------------------------------------------------------- Multi Wound Chart Details Patient Name: [name] Date of Service: 01/15/2016 2:15 PM Medical Record Number: [id number] Patient Account Number: [id number] Date of Birth/Sex: [birth year] (34 y.o. 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] [9:the Lower Extremity] Comorbid History: [12:Anemia, Type I Diabetes, Anemia, Type I Diabetes, N/A End Stage Renal Disease, End Stage Renal Disease, Rheumatoid Arthritis, Neuropathy] [9:Rheumatoid Arthritis, Neuropathy] Date Acquired: [12:07/28/2015] [9:07/28/2015] [N/A:N/A] Weeks of Treatment: [12:19] [9:19] [N/A:N/A] Wound Status: [12:Open]  [9:Open] [N/A:N/A] Pending Amputation on Yes [9:No] [N/A:N/A] Presentation: Measurements L x W x D 8.8x6.8x0.2 [9:4.3x6.6x0.2] [N/A:N/A] (cm) Area (cm) : [12:46.998] [9:22.29] [N/A:N/A] Volume (cm) : [12:9.4] [9:4.458] [N/A:N/A] % Reduction in Area: [12:-8.80%] [9:-20.00%] [N/A:N/A] % Reduction in Volume: -117.60% [9:-140.10%] [N/A:N/A] Classification: [12:Grade 2] [9:Grade 2] [N/A:N/A] Exudate Amount: [12:Medium] [9:Small] [N/A:N/A] Exudate Type: [12:Serous] [9:Serous] [N/A:N/A] Exudate Color: [12:amber] [9:amber] [N/A:N/A] Foul Odor After [12:Yes] [9:Yes] [N/A:N/A] Cleansing: Odor Anticipated Due to No [9:No] [N/A:N/A] Product Use: Wound Margin: [12:Indistinct, nonvisible] [9:Distinct, outline attached N/A] Granulation Amount: Small (1-33%) Small (1-33%)  N/A Granulation Quality: Red Pink N/A Necrotic Amount: Large (67-100%) Large (67-100%) N/A Necrotic Tissue: Eschar, Adherent Slough Eschar, Adherent Slough N/A Exposed Structures: Fascia: No Fascia: No N/A Fat: No Fat: No Tendon: No Tendon: No Muscle: No Muscle: No Joint: No Joint: No Bone: No Bone: No Limited to Skin Limited to Skin Breakdown Breakdown Epithelialization: None None N/A Periwound Skin Texture: Edema: Yes Edema: No N/A Excoriation: No Excoriation: No Induration: No Induration: No Callus: No Callus: No Crepitus: No Crepitus: No Fluctuance: No Fluctuance: No Friable: No Friable: No Rash: No Rash: No Scarring: No Scarring: No Periwound Skin Moist: Yes Moist: Yes N/A Moisture: Maceration: No Maceration: No Dry/Scaly: No Dry/Scaly: No Periwound Skin Color: Atrophie Blanche: No Atrophie Blanche: No N/A Cyanosis: No Cyanosis: No Ecchymosis: No Ecchymosis: No Erythema: No Erythema: No Hemosiderin Staining: No Hemosiderin Staining: No Mottled: No Mottled: No Pallor: No Pallor: No Rubor: No Rubor: No Temperature: No Abnormality No Abnormality N/A Tenderness on No Yes  N/A Palpation: Wound Preparation: Ulcer Cleansing: Ulcer Cleansing: N/A Rinsed/Irrigated with Rinsed/Irrigated with Saline Saline Topical Anesthetic Topical Anesthetic Applied: None Applied: None Treatment Notes Electronic Signature(s) Signed: 01/15/2016 4:40:51 PM By: [name] Entered By: [name] on 01/15/2016 14:51:09 [name] ([id number] Craft, Ledell Peoples ([id number]) -------------------------------------------------------------------------------- Multi-Disciplinary Care Plan Details Patient Name: [name] Date of Service: 01/15/2016 2:15 PM Medical Record Number: [id number] Patient Account Number: [id number] Date of Birth/Sex: 1981-01-27 (34 y.o. Female) Treating RN: [name] Primary Care Physician: [name] Other Clinician: Referring Physician: [name] Treating Physician/Extender: Rudene Re in Treatment: 24 Active Inactive HBO Nursing Diagnoses: Anxiety related to feelings of confinement associated with the hyperbaric oxygen chamber Anxiety related to knowledge deficit of hyperbaric oxygen therapy and treatment procedures Discomfort related to temperature and humidity changes inside hyperbaric chamber Potential for barotraumas to ears, sinuses, teeth, and lungs or cerebral gas embolism related to changes in atmospheric pressure inside hyperbaric oxygen chamber Potential for oxygen toxicity seizures related to delivery of 100% oxygen at an increased atmospheric pressure Potential for pulmonary oxygen toxicity related to delivery of 100% oxygen at an increased atmospheric pressure Goals: Barotrauma will be prevented during HBO2 Date Initiated: 08/30/2015 Goal Status: Active Patient and/or family will be able to state/discuss factors appropriate to the management of their disease process during treatment Date Initiated: 08/30/2015 Goal Status: Active Patient will tolerate the hyperbaric oxygen therapy  treatment Date Initiated: 08/30/2015 Goal Status: Active Patient will tolerate the internal climate of the chamber Date Initiated: 08/30/2015 Goal Status: Active Patient/caregiver will verbalize understanding of HBO goals, rationale, procedures and potential hazards Date Initiated: 08/30/2015 Goal Status: Active Signs and symptoms of pulmonary oxygen toxicity will be recognized and promptly addressed Date Initiated: 08/30/2015 Goal Status: Active Signs and symptoms of seizure will be recognized and promptly addressed ; seizing patients will suffer no harm Date Initiated: 08/30/2015 [name] ([id number]) Goal Status: Active Interventions: Administer a five (5) minute air break for patient if signs and symptoms of seizure appear and notify the hyperbaric physician Administer a ten (10) minute air break for patient if signs and symptoms of seizure appear and notify the hyperbaric physician Administer decongestants, per physician orders, prior to HBO2 Administer the correct therapeutic gas delivery based on the patients needs and limitations, per physician order Assess and provide for patientos comfort related to the hyperbaric environment and equalization of middle ear Assess for signs and symptoms related to adverse events, including but not limited to confinement anxiety, pneumothorax, oxygen toxicity  and baurotrauma Assess patient for any history of confinement anxiety Assess patient's knowledge and expectations regarding hyperbaric medicine and provide education related to the hyperbaric environment, goals of treatment and prevention of adverse events Implement protocols to decrease risk of pneumothorax in high risk patients Notes: Abuse / Safety / Falls / Self Care Management Nursing Diagnoses: Potential for falls Goals: Patient will remain injury free Date Initiated: 09/19/2015 Goal Status: Active Patient/caregiver will verbalize/demonstrate measures taken to  prevent injury and/or falls Date Initiated: 09/19/2015 Goal Status: Active Interventions: Assess fall risk on admission and as needed Assess impairment of mobility on admission and as needed per policy Notes: Orientation to the Wound Care Program Nursing Diagnoses: Knowledge deficit related to the wound healing center program Goals: Patient/caregiver will verbalize understanding of the Wound Healing Center Program [place] ([id number]) Date Initiated: 08/30/2015 Goal Status: Active Interventions: Provide education on orientation to the wound center Notes: Pressure Nursing Diagnoses: Knowledge deficit related to causes and risk factors for pressure ulcer development Knowledge deficit related to management of pressures ulcers Potential for impaired tissue integrity related to pressure, friction, moisture, and shear Goals: Patient will remain free from development of additional pressure ulcers Date Initiated: 08/30/2015 Goal Status: Active Patient will remain free of pressure ulcers Date Initiated: 08/30/2015 Goal Status: Active Patient/caregiver will verbalize risk factors for pressure ulcer development Date Initiated: 08/30/2015 Goal Status: Active Patient/caregiver will verbalize understanding of pressure ulcer management Date Initiated: 08/30/2015 Goal Status: Active Interventions: Assess: immobility, friction, shearing, incontinence upon admission and as needed Assess offloading mechanisms upon admission and as needed Assess potential for pressure ulcer upon admission and as needed Provide education on pressure ulcers Treatment Activities: Patient referred for home evaluation of offloading devices/mattresses : 01/15/2016 Patient referred for pressure reduction/relief devices : 01/15/2016 Pressure reduction/relief device ordered : 01/15/2016 Notes: Wound/Skin Impairment Nursing Diagnoses: Impaired tissue integrity Norlander, Robena N. ([id number]) Knowledge  deficit related to ulceration/compromised skin integrity Goals: Patient will have a decrease in wound volume by X% from date: (specify in notes) Date Initiated: 08/30/2015 Goal Status: Active Patient/caregiver will verbalize understanding of skin care regimen Date Initiated: 08/30/2015 Goal Status: Active Ulcer/skin breakdown will have a volume reduction of 30% by week 4 Date Initiated: 08/30/2015 Goal Status: Active Ulcer/skin breakdown will have a volume reduction of 50% by week 8 Date Initiated: 08/30/2015 Goal Status: Active Ulcer/skin breakdown will have a volume reduction of 80% by week 12 Date Initiated: 08/30/2015 Goal Status: Active Ulcer/skin breakdown will heal within 14 weeks Date Initiated: 08/30/2015 Goal Status: Active Interventions: Assess patient/caregiver ability to obtain necessary supplies Assess patient/caregiver ability to perform ulcer/skin care regimen upon admission and as needed Assess ulceration(s) every visit Provide education on ulcer and skin care Notes: Electronic Signature(s) Signed: 01/15/2016 4:40:51 PM By: [name] Entered By: [name] on 01/15/2016 14:50:53 [name] ([id number]) -------------------------------------------------------------------------------- Patient/Caregiver Education Details Patient Name: [name] Date of Service: 01/15/2016 2:15 PM Medical Record Number: [id number] Patient Account Number: [id number] Date of Birth/Gender: 1981/07/09 (35 y.o. Female) Treating RN: [name] Primary Care Physician: [name] Other Clinician: Referring Physician: [name] Treating Physician/Extender: Rudene Re in Treatment: 25 Education Assessment Education Provided To: Patient and Caregiver Education Topics Provided Wound/Skin Impairment: Handouts: Other: wound care to continue as ordered Methods: Demonstration, Explain/Verbal Responses: State content correctly Electronic  Signature(s) Signed: 01/15/2016 4:00:04 PM By: [name] Entered By: [name] on 01/15/2016 16:00:04 [name] ([id number]) -------------------------------------------------------------------------------- Wound Assessment Details Patient Name: [name] Date  of Service: 01/15/2016 2:15 PM Medical Record Number: [id number] Patient Account Number: [id number] Date of Birth/Sex: 23-Dec-1980 (34 y.o. Female) Treating RN: [name] Primary Care Physician: [name] Other Clinician: Referring Physician: [name] Treating Physician/Extender: Rudene Re in Treatment: 19 Wound Status Wound Number: 12 Primary Diabetic Wound/Ulcer of the Lower Etiology: Extremity Wound Location: Left Foot - Plantar, Circumfernential Wound Open Status: Wounding Event: Gradually Appeared Comorbid Anemia, Type I Diabetes, End Stage Date Acquired: 07/28/2015 History: Renal Disease, Rheumatoid Arthritis, Weeks Of Treatment: 19 Neuropathy Clustered Wound: No Pending Amputation On Presentation Photos Photo Uploaded By: [name] on 01/15/2016 16:28:52 Wound Measurements Length: (cm) 8.8 Width: (cm) 6.8 Depth: (cm) 0.2 Area: (cm) 46.998 Volume: (cm) 9.4 % Reduction in Area: -8.8% % Reduction in Volume: -117.6% Epithelialization: None Tunneling: No Undermining: No Wound Description Classification: Grade 2 Foul Odor After Wound Margin: Indistinct, nonvisible Due to Product Exudate Amount: Medium Exudate Type: Serous Exudate Color: amber Cleansing: Yes Use: No Wound Bed Granulation Amount: Small (1-33%) Exposed Structure Granulation Quality: Red Fascia Exposed: No Difatta, Mirayah N. ([id number]) Necrotic Amount: Large (67-100%) Fat Layer Exposed: No Necrotic Quality: Eschar, Adherent Slough Tendon Exposed: No Muscle Exposed: No Joint Exposed: No Bone Exposed: No Limited to Skin Breakdown Periwound Skin Texture Texture Color No  Abnormalities Noted: No No Abnormalities Noted: No Callus: No Atrophie Blanche: No Crepitus: No Cyanosis: No Excoriation: No Ecchymosis: No Fluctuance: No Erythema: No Friable: No Hemosiderin Staining: No Induration: No Mottled: No Localized Edema: Yes Pallor: No Rash: No Rubor: No Scarring: No Temperature / Pain Moisture Temperature: No Abnormality No Abnormalities Noted: No Dry / Scaly: No Maceration: No Moist: Yes Wound Preparation Ulcer Cleansing: Rinsed/Irrigated with Saline Topical Anesthetic Applied: None Treatment Notes Wound #12 (Left, Plantar, Circumferential Foot) 1. Cleansed with: Clean wound with Normal Saline 2. Anesthetic Topical Lidocaine 4% cream to wound bed prior to debridement 4. Dressing Applied: Aquacel Ag Other dressing (specify in notes) 5. Secondary Dressing Applied Guaze, ABD and kerlix/Conform Notes betadine paint and stretch netting Electronic Signature(s) Signed: 01/15/2016 4:40:51 PM By: [name] Entered By: [name] on 01/15/2016 14:50:07 [name] ([id number]) [place]en[place], Ledell Peoples ([id number]) -------------------------------------------------------------------------------- Wound Assessment Details Patient Name: [name] Date of Service: 01/15/2016 2:15 PM Medical Record Number: [id number] Patient Account Number: [id number] Date of Birth/Sex: [birth year] (34 y.o. Female) Treating RN: [name] Primary Care Physician: [name] Other Clinician: Referring Physician: [name] Treating Physician/Extender: Rudene Re in Treatment: 19 Wound Status Wound Number: 9 Primary Diabetic Wound/Ulcer of the Lower Etiology: Extremity Wound Location: Left Calcaneous Wound Open Wounding Event: Pressure Injury Status: Date Acquired: 07/28/2015 Comorbid Anemia, Type I Diabetes, End Stage Weeks Of Treatment: 19 History: Renal Disease, Rheumatoid Arthritis, Clustered Wound:  No Neuropathy Photos Photo Uploaded By: [name] on 01/15/2016 16:29:15 Wound Measurements Length: (cm) 4.3 Width: (cm) 6.6 Depth: (cm) 0.2 Area: (cm) 22.29 Volume: (cm) 4.458 % Reduction in Area: -20% % Reduction in Volume: -140.1% Epithelialization: None Tunneling: No Undermining: No Wound Description Classification: Grade 2 Foul Odor Aft Wound Margin: Distinct, outline attached Due to Produc Exudate Amount: Small Exudate Type: Serous Exudate Color: amber er Cleansing: Yes t Use: No Wound Bed Granulation Amount: Small (1-33%) Exposed Structure Granulation Quality: Pink Fascia Exposed: No Necrotic Amount: Large (67-100%) Fat Layer Exposed: No Necrotic Quality: Eschar, Adherent Slough Tendon Exposed: No Blundell, Kacie N. ([id number]) Muscle Exposed: No Joint Exposed: No Bone Exposed: No Limited to Skin Breakdown Periwound Skin Texture Texture Color No Abnormalities Noted:  No No Abnormalities Noted: No Callus: No Atrophie Blanche: No Crepitus: No Cyanosis: No Excoriation: No Ecchymosis: No Fluctuance: No Erythema: No Friable: No Hemosiderin Staining: No Induration: No Mottled: No Localized Edema: No Pallor: No Rash: No Rubor: No Scarring: No Temperature / Pain Moisture Temperature: No Abnormality No Abnormalities Noted: No Tenderness on Palpation: Yes Dry / Scaly: No Maceration: No Moist: Yes Wound Preparation Ulcer Cleansing: Rinsed/Irrigated with Saline Topical Anesthetic Applied: None Treatment Notes Wound #9 (Left Calcaneous) 1. Cleansed with: Clean wound with Normal Saline 2. Anesthetic Topical Lidocaine 4% cream to wound bed prior to debridement 4. Dressing Applied: Aquacel Ag Other dressing (specify in notes) 5. Secondary Dressing Applied Guaze, ABD and kerlix/Conform Notes betadine paint and stretch netting Electronic Signature(s) Signed: 01/15/2016 4:40:51 PM By: [name] Entered By: [name] on  01/15/2016 14:50:33 [name] ([id number]) -------------------------------------------------------------------------------- Vitals Details Patient Name: [name] Date of Service: 01/15/2016 2:15 PM Medical Record Number: [id number] Patient Account Number: [id number] Date of Birth/Sex: 01/22/1981 (34 y.o. Female) Treating RN: [name] Primary Care Physician: [name] Other Clinician: Referring Physician: [name] Treating Physician/Extender: Rudene Re in Treatment: 56 Vital Signs Time Taken: 14:38 Temperature (F): 98.2 Height (in): 68 Pulse (bpm): 72 Weight (lbs): 96 Respiratory Rate (breaths/min): 16 Body Mass Index (BMI): 14.6 Blood Pressure (mmHg): 149/89 Reference Range: 80 - 120 mg / dl Electronic Signature(s) Signed: 01/15/2016 4:40:51 PM By: [name] Entered By: [name] on 01/15/2016 14:38:43

## 2016-01-21 ENCOUNTER — Encounter: Payer: Medicare Other | Admitting: Internal Medicine

## 2016-01-21 DIAGNOSIS — E10621 Type 1 diabetes mellitus with foot ulcer: Secondary | ICD-10-CM | POA: Diagnosis not present

## 2016-01-22 ENCOUNTER — Ambulatory Visit: Payer: Medicare Other | Admitting: Internal Medicine

## 2016-01-23 NOTE — Progress Notes (Signed)
[name], [name] ([id number]) Visit Report for 01/21/2016 Chief Complaint Document Details COLUMBIA, BARBERENA Date of Service: 01/21/2016 12:45 PM Patient Name: N. Patient Account Number: [id number] Medical Record Treating RN: [name] [id number] Number: Other Clinician: 01-30-1981 (34 y.o. Treating [name], [name] Date of Birth/Sex: Female) Physician/Extender: G Primary Care [name] Physician: Referring Physician: [name] in Treatment: 20 Information Obtained from: Patient Chief Complaint Patient in today for treatment of non-healing wound and HBO Treatment. she has just gotten out of hospital this week and is back to resume her hyperbaric oxygen therapy Electronic Signature(s) Signed: 01/23/2016 7:59:30 AM By: Baltazar Najjar MD Entered By: Baltazar Najjar on 01/21/2016 13:42:33 [name] ([id number]) -------------------------------------------------------------------------------- Debridement Details [name] Date of Service: 01/21/2016 12:45 PM Patient Name: N. Patient Account Number: [id number] Medical Record Treating RN: [name] [id number] Number: Other Clinician: 1981/05/12 (34 y.o. Treating [name], [name] Date of Birth/Sex: Female) Physician/Extender: G Primary Care [name] Physician: Referring Physician: [name] in Treatment: 20 Debridement Performed for Wound #9 Left Calcaneous Assessment: Performed By: Physician [name], MD Debridement: Debridement Pre-procedure Yes Verification/Time Out Taken: Start Time: 13:25 Pain Control: Lidocaine 4% Topical Solution Level: Skin/Subcutaneous Tissue Total Area Debrided (L x 3.7 (cm) x 5.5 (cm) = 20.35 (cm) W): Tissue and other Viable, Non-Viable, Eschar material debrided: Instrument: Blade Bleeding: Minimum Hemostasis Achieved: Silver Nitrate End Time: 13:30 Procedural Pain: 0 Post Procedural Pain: 0 Response to Treatment:  Procedure was tolerated well Post Debridement Measurements of Total Wound Length: (cm) 3.7 Width: (cm) 5.5 Depth: (cm) 0.1 Volume: (cm) 1.598 Post Procedure Diagnosis Same as Pre-procedure Electronic Signature(s) Signed: 01/21/2016 5:03:54 PM By: [name] Signed: 01/23/2016 7:59:30 AM By: Baltazar Najjar MD Entered By: Baltazar Najjar on 01/21/2016 13:42:02 [name] ([id number]) [name], [name] ([id number]) -------------------------------------------------------------------------------- HPI Details [name] Date of Service: 01/21/2016 12:45 PM Patient Name: N. Patient Account Number: [id number] Medical Record Treating RN: [name] [id number] Number: Other Clinician: 03/01/1981 (34 y.o. Treating [name], [name] Date of Birth/Sex: Female) Physician/Extender: G Primary Care [name] Physician: Referring Physician: [name] in Treatment: 20 History of Present Illness Location: dry gangrene both feet and heels Quality: Patient reports No Pain. Severity: Patient states wound are getting worse. Duration: Patient has had the wound for > 61months prior to seeking treatment at the wound center Context: The wound appeared gradually over time Modifying Factors: she has been in and out of hospital over the last 2 months Associated Signs and Symptoms: Patient reports having difficulty standing for long periods. HPI Description: [name] is a 35 y.o. female who presents to our wound center, back in June 2016, referred by her PCP Dr. Zada Finders for nonhealing ulcers on the lateral aspect of the right heel. Of note she has a history of type 1 diabetes mellitus that has been uncontrolled. Past medical history significant for type 1 diabetes mellitus not controlled, ankylosing spondylitis, anorexia nervosa, irritable bowel syndrome, chronic kidney disease, chronic diarrhea. she then developed gangrene of both feet due to severe  peripheral vascular disease and also had gangrene of the tips of her fingers due to upper extremity vascular disease. She was being worked up by vascular surgery at [facility] and at [facility] and has had several procedures done there. She started with hyperbaric oxygen therapy and had a total of 40 treatments the last one being on 06/20/2015. After the initial treatment of hyperbaric oxygen therapy she started having ear problems and had ultimately to use myringotomy tubes and  this was done bilaterally. Since then her ears have been doing fine. In late September, she had seen vascular and hand surgeons. since then she's been in [place] at the rec center for surgery involving extensive vascular procedures for the upper extremities. She was then at [facility] with GI bleeds both upper and lower and has been in and out of hospital for that. She has recently been out of hospital for the last week. 09/09/2015 -- she was unable to get here in time to start her hyperbaric oxygen today and hence is only here for a wound care visit. 09/19/2015 -- she has been having vancomycin during her dialysis and continues to have vascular appointments and the procedure is been set for early January. She has been unable to make it for her hemodialysis due to various medical symptoms. 09/30/2014 -- her vancomycin was stopped on 09/25/2015 and the mother has noticed the right foot has started draining for the last 3 days. Addendum: after examining the patient I was able to talk to her primary vascular surgeon Dr. Pernell Dupre at the [facility]. I told him about the necrotic area on the plantar aspect of right foot which is now wet gangrene and he agreed with me that he would admit her at [facility] under her care and synchronize further treatment. We have discussed her poor prognosis and he and I discussed the need for hospice care [facility], [name] ([id number]) and for sitting down and talking to the patient and her mother  and giving them a proper detail of the prognosis. 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 She was worked up with MRIs, abdomen aortogram and placement of left-sided angioplasties were done. After a prolonged hospital she she was discharged home and was told to wear shrinker sock and stump protector and see her surgeon for further instructions regarding wound care and suture removal. Asked to take long-term doxycycline. 01/15/16; this is a patient I haven't seen before although she is been followed by Dr. Meyer Russel in this clinic today. She is a type I diabetic with severe PAD macrovascular disease. She has had a previous BKA. She has dry gangrene of the tips of her fingers which she showed me on the right to. She also has dry gangrene of the left first second and third toes and a portion of her proximal foot around these areas. She is followed by vascular surgery at [facility] and saw them recently they are not going to do surgery as of yet. She has a large black eschar over her heel which is beginning to separate in some areas. As I understand think she is paining these with Betadine. There is been some suggestion about retrying hyperbarics on her although she is still not able to commit to the frequency of treatment that would be necessary to see improvement. She is also on Monday Wednesday Friday dialysis 01/21/16; the patient returns to see me today with regards to the left heel. Apparently she is not scheduled for any further attempts at revascularization of the left foot. She has dry gangrene of the left  medial foot, first and second toes and there is already  some separation developing here. Electronic Signature(s) Signed: 01/23/2016 7:59:30 AM By: Baltazar Najjar MD Entered By: Baltazar Najjar on 01/21/2016 13:43:48 [name] ([id number]) -------------------------------------------------------------------------------- Physical Exam Details [name] Date of Service: 01/21/2016 12:45 PM Patient Name: N. Patient Account Number: [id number] Medical Record Treating RN: [name] [id number] Number: Other Clinician: 12-17-1980 (34 y.o. Treating [name], [name] Date of Birth/Sex: Female) Physician/Extender: G Primary Care [name] Physician: Referring Physician: [name] in Treatment: 20 Notes The left medial foot first and second toes appear stable there is no infection although the base of the first toe is clearly separating. The black eschar she has over her left heel is continuing beginning to separate as well Electronic Signature(s) Signed: 01/23/2016 7:59:30 AM By: Baltazar Najjar MD Entered By: Baltazar Najjar on 01/21/2016 13:44:45 [name] ([id number]) -------------------------------------------------------------------------------- Physician Orders Details [name] Date of Service: 01/21/2016 12:45 PM Patient Name: N. Patient Account Number: [id number] Medical Record Treating RN: [name] [id number] Number: Other Clinician: 07-07-1981 (34 y.o. Treating [name], [name] Date of Birth/Sex: Female) Physician/Extender: G Primary Care [name] Physician: Referring Physician: [name] in Treatment: 20 Verbal / Phone Orders: Yes Clinician: [name] Read Back and Verified: Yes Diagnosis Coding Wound Cleansing Wound #12 Left,Plantar,Circumferential Foot o Cleanse wound with mild soap and water Wound #9 Left Calcaneous o Cleanse wound with mild soap and water Primary Wound  Dressing Wound #12 Left,Plantar,Circumferential Foot o Aquacel Ag - to wet areas ****PLEASE ORDER THE 10CM / 4IN X 5 IN SQUARES ****** o Other: - betadine STICKS paint to dry areas ****PLEASE ORDER SOME FOR PT***** Wound #9 Left Calcaneous o Santyl Ointment Secondary Dressing Wound #12 Left,Plantar,Circumferential Foot o ABD pad o Gauze and Kerlix/Conform Wound #9 Left Calcaneous o ABD pad o Gauze and Kerlix/Conform Dressing Change Frequency Wound #12 Left,Plantar,Circumferential Foot o Change dressing every day. Wound #9 Left Calcaneous o Change dressing every day. [name], [name] ([id number]) Follow-up Appointments Wound #12 Left,Plantar,Circumferential Foot o Other: - as patient is able Wound #9 Left Calcaneous o Other: - as patient is able Off-Loading Wound #12 Left,Plantar,Circumferential Foot o Open toe surgical shoe with peg assist. Wound #9 Left Calcaneous o Open toe surgical shoe with peg assist. Home Health Wound #12 Left,Plantar,Circumferential Foot o Continue Home Health Visits - [facility] - only for PT and wound care supplies o Home Health Nurse may visit PRN to address patientos wound care needs. o FACE TO FACE ENCOUNTER: MEDICARE and MEDICAID PATIENTS: I certify that this patient is under my care and that I had a face-to-face encounter that meets the physician face-to-face encounter requirements with this patient on this date. The encounter with the patient was in whole or in part for the following MEDICAL CONDITION: (primary reason for Home Healthcare) MEDICAL NECESSITY: I certify, that based on my findings, NURSING services are a medically necessary home health service. HOME BOUND STATUS: I certify that my clinical findings support that this patient is homebound (i.e., Due to illness or injury, pt requires aid of supportive devices such as crutches, cane, wheelchairs, walkers, the use of special transportation or the  assistance of another person to leave their place of residence. There is a normal inability to leave the home and doing so requires considerable and taxing effort. Other absences are for medical reasons / religious services and are infrequent or of short duration when for other reasons). o If current dressing causes regression in wound condition, may D/C ordered dressing product/s and apply Normal Saline Moist Dressing  daily until next Wound Healing Center / Other MD appointment. Notify Wound Healing Center of regression in wound condition at [phone]. o Please direct any NON-WOUND related issues/requests for orders to patient's Primary Care Physician Wound #9 Left Calcaneous o Continue Home Health Visits - [facility] - only for PT and wound care supplies o Home Health Nurse may visit PRN to address patientos wound care needs. o FACE TO FACE ENCOUNTER: MEDICARE and MEDICAID PATIENTS: I certify that this patient is under my care and that I had a face-to-face encounter that meets the physician face-to-face encounter requirements with this patient on this date. The encounter with the patient was in whole or in part for the following MEDICAL CONDITION: (primary reason for Home Healthcare) MEDICAL NECESSITY: I certify, that based on my findings, NURSING services are a medically necessary home health service. HOME BOUND STATUS: I certify that my clinical findings support that this patient is homebound (i.e., Due to illness or injury, pt requires aid of supportive devices such as crutches, cane, wheelchairs, walkers, the use of special transportation or the assistance of another person to leave their place of residence. There is a normal inability to leave the home and doing so requires considerable and taxing effort. ADELIE, CROSWELL ([id number]) absences are for medical reasons / religious services and are infrequent or of short duration when for other reasons). o If  current dressing causes regression in wound condition, may D/C ordered dressing product/s and apply Normal Saline Moist Dressing daily until next Wound Healing Center / Other MD appointment. Notify Wound Healing Center of regression in wound condition at [phone]. o Please direct any NON-WOUND related issues/requests for orders to patient's Primary Care Physician Electronic Signature(s) Signed: 01/21/2016 5:03:54 PM By: [name] Signed: 01/23/2016 7:59:30 AM By: Baltazar Najjar MD Entered By: [name] on 01/21/2016 13:32:21 [name] ([id number]) -------------------------------------------------------------------------------- Problem List Details [name] Date of Service: 01/21/2016 12:45 PM Patient Name: N. Patient Account Number: [id number] Medical Record Treating RN: [name] [id number] Number: Other Clinician: [birth year] (34 y.o. Treating [name], [name] Date of Birth/Sex: Female) Physician/Extender: G Primary Care [name] Physician: Referring Physician: [name] in Treatment: 20 Active Problems ICD-10 Encounter Code Description Active Date Diagnosis E10.621 Type 1 diabetes mellitus with foot ulcer 08/30/2015 Yes E10.52 Type 1 diabetes mellitus with diabetic peripheral 08/30/2015 Yes angiopathy with gangrene I70.245 Atherosclerosis of native arteries of left leg with ulceration 08/30/2015 Yes of other part of foot I70.261 Atherosclerosis of native arteries of extremities with 08/30/2015 Yes gangrene, right leg L89.622 Pressure ulcer of left heel, stage 2 08/30/2015 Yes Z89.511 Acquired absence of right leg below knee 11/29/2015 Yes Inactive Problems Resolved Problems ICD-10 Code Description Active Date Resolved Date L02.611 Cutaneous abscess of right foot 10/01/2015 10/01/2015 [name], [name] ([id number]) Electronic Signature(s) Signed: 01/23/2016 7:59:30 AM By: Baltazar Najjar MD Entered By: Baltazar Najjar on  01/21/2016 13:41:50 [name] ([id number]) -------------------------------------------------------------------------------- Progress Note Details [name] Date of Service: 01/21/2016 12:45 PM Patient Name: N. Patient Account Number: [id number] Medical Record Treating RN: [name] [id number] Number: Other Clinician: [birth year] (34 y.o. Treating [name], [name] Date of Birth/Sex: Female) Physician/Extender: G Primary Care [name] Physician: Referring Physician: [name] in Treatment: 20 Subjective Chief Complaint Information obtained from Patient Patient in today for treatment of non-healing wound and HBO Treatment. she has just gotten out of hospital this week and is back to resume her hyperbaric oxygen therapy History of Present Illness (HPI) The following HPI elements were  documented for the patient's wound: Location: dry gangrene both feet and heels Quality: Patient reports No Pain. Severity: Patient states wound are getting worse. Duration: Patient has had the wound for > 4months prior to seeking treatment at the wound center Context: The wound appeared gradually over time Modifying Factors: she has been in and out of hospital over the last 2 months Associated Signs and Symptoms: Patient reports having difficulty standing for long periods. Aaisha Sliter is a 35 y.o. female who presents to our wound center, back in June 2016, referred by her PCP Dr. Zada Finders for nonhealing ulcers on the lateral aspect of the right heel. Of note she has a history of type 1 diabetes mellitus that has been uncontrolled. Past medical history significant for type 1 diabetes mellitus not controlled, ankylosing spondylitis, anorexia nervosa, irritable bowel syndrome, chronic kidney disease, chronic diarrhea. she then developed gangrene of both feet due to severe peripheral vascular disease and also had gangrene of the tips of her fingers due to upper  extremity vascular disease. She was being worked up by vascular surgery at [facility] and at [facility] and has had several procedures done there. She started with hyperbaric oxygen therapy and had a total of 40 treatments the last one being on 06/20/2015. After the initial treatment of hyperbaric oxygen therapy she started having ear problems and had ultimately to use myringotomy tubes and this was done bilaterally. Since then her ears have been doing fine. In late September, she had seen vascular and hand surgeons. since then she's been in [place] at the rec center for surgery involving extensive vascular procedures for the upper extremities. She was then at [facility] with GI bleeds both upper and lower and has been in and out of hospital for that. She has recently been out of hospital for the last week. 09/09/2015 -- she was unable to get here in time to start her hyperbaric oxygen today and hence is only here for a wound care visit. 09/19/2015 -- she has been having vancomycin during her dialysis and continues to have vascular Wu, [name] ([id number]) appointments and the procedure is been set for early January. She has been unable to make it for her hemodialysis due to various medical symptoms. 09/30/2014 -- her vancomycin was stopped on 09/25/2015 and the mother has noticed the right foot has started draining for the last 3 days. Addendum: after examining the patient I was able to talk to her primary vascular surgeon Dr. Pernell Dupre at the [facility]. I told him about the necrotic area on the plantar aspect of right foot which is now wet gangrene and he agreed with me that he would admit her at [facility] under her care and synchronize further treatment. We have discussed her poor prognosis and he and I discussed the need for hospice care and for sitting down and talking to the patient and her mother and giving them a proper detail of the prognosis. 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 She was worked up with MRIs, abdomen aortogram and placement of left-sided angioplasties were done. After a prolonged hospital she she was discharged home and was told to  wear shrinker sock and stump protector and see her surgeon for further instructions regarding wound care and suture removal. Asked to take long-term doxycycline. 01/15/16; this is a patient I haven't seen before although she is been followed by Dr. Meyer Russel in this clinic today. She is a type I diabetic with severe PAD macrovascular disease. She has had a previous BKA. She has dry gangrene of the tips of her fingers which she showed me on the right to. She also has dry gangrene of the left first second and third toes and a portion of her proximal foot around these areas. She is followed by vascular surgery at [facility] and saw them recently they are not going to do surgery as of yet. She has a large black eschar over her heel which is beginning to separate in some areas. As I understand think she is paining these with Betadine. There is been some suggestion about retrying hyperbarics on her although she is still not able to commit to the frequency of treatment that would be necessary to see improvement. She is also on Monday Wednesday Friday dialysis 01/21/16; the patient returns to see me today with regards to the left heel. Apparently she is not scheduled for any further attempts at revascularization of the left foot. She has dry gangrene of the left medial foot, first and second toes and there is already some separation developing  here. Objective Constitutional Vitals Time Taken: 12:58 PM, Height: 68 in, Weight: 96 lbs, BMI: 14.6, Temperature: 98.1 F, Pulse: 80 bpm, Respiratory Rate: 16 breaths/min, Blood Pressure: 135/98 mmHg. Integumentary (Hair, Skin) Wound #12 status is Open. Original cause of wound was Gradually Appeared. The wound is located on the Left,Plantar,Circumferential Foot. The wound measures 8.8cm length x 6.6cm width x 0.2cm depth; 45.616cm^2 area and 9.123cm^3 volume. The wound is limited to skin breakdown. There is no tunneling or undermining noted. There is a medium amount of serous drainage noted. The wound margin is indistinct Wu, [name] ([id number]) and nonvisible. There is small (1-33%) red granulation within the wound bed. There is a large (67-100%) amount of necrotic tissue within the wound bed including Eschar and Adherent Slough. The periwound skin appearance exhibited: Localized Edema, Moist. The periwound skin appearance did not exhibit: Callus, Crepitus, Excoriation, Fluctuance, Friable, Induration, Rash, Scarring, Dry/Scaly, Maceration, Atrophie Blanche, Cyanosis, Ecchymosis, Hemosiderin Staining, Mottled, Pallor, Rubor, Erythema. Periwound temperature was noted as No Abnormality. Wound #9 status is Open. Original cause of wound was Pressure Injury. The wound is located on the Left Calcaneous. The wound measures 3.7cm length x 5.5cm width x 0.1cm depth; 15.983cm^2 area and 1.598cm^3 volume. The wound is limited to skin breakdown. There is no tunneling or undermining noted. There is a small amount of serous drainage noted. The wound margin is distinct with the outline attached to the wound base. There is small (1-33%) pink granulation within the wound bed. There is a large (67-100%) amount of necrotic tissue within the wound bed including Eschar and Adherent Slough. The periwound skin appearance exhibited: Moist. The periwound skin appearance did not exhibit: Callus, Crepitus,  Excoriation, Fluctuance, Friable, Induration, Localized Edema, Rash, Scarring, Dry/Scaly, Maceration, Atrophie Blanche, Cyanosis, Ecchymosis, Hemosiderin Staining, Mottled, Pallor, Rubor, Erythema. Periwound temperature was noted as No Abnormality. The periwound has tenderness on palpation. Assessment Active Problems ICD-10 E10.621 - Type 1 diabetes mellitus with foot ulcer E10.52 - Type 1 diabetes mellitus with diabetic peripheral angiopathy with gangrene I70.245 - Atherosclerosis of native arteries of left leg with ulceration of other  part of foot I70.261 - Atherosclerosis of native arteries of extremities with gangrene, right leg Z61.096 - Pressure ulcer of left heel, stage 2 Z89.511 - Acquired absence of right leg below knee Procedures Wound #9 Wound #9 is a Diabetic Wound/Ulcer of the Lower Extremity located on the Left Calcaneous . There was a Skin/Subcutaneous Tissue Debridement ([id number]) debridement with total area of 20.35 sq cm performed by [name], MD. with the following instrument(s): Blade to remove Viable and Non-Viable tissue/material including Eschar after achieving pain control using Lidocaine 4% Topical Solution. A time out was conducted prior to the start of the procedure. A Minimum amount of bleeding was controlled with Silver Nitrate. The procedure was tolerated well with a pain level of 0 throughout and a pain level of 0 following the procedure. Post Debridement Measurements: 3.7cm length x 5.5cm width x 0.1cm depth; 1.598cm^3 volume. Post procedure Diagnosis Wound #9: Same as Pre-Procedure [name], [name] ([id number]) Plan Wound Cleansing: Wound #12 Left,Plantar,Circumferential Foot: Cleanse wound with mild soap and water Wound #9 Left Calcaneous: Cleanse wound with mild soap and water Primary Wound Dressing: Wound #12 Left,Plantar,Circumferential Foot: Aquacel Ag - to wet areas ****PLEASE ORDER THE 10CM / 4IN X 5 IN SQUARES ****** Other: -  betadine STICKS paint to dry areas ****PLEASE ORDER SOME FOR PT***** Wound #9 Left Calcaneous: Santyl Ointment Secondary Dressing: Wound #12 Left,Plantar,Circumferential Foot: ABD pad Gauze and Kerlix/Conform Wound #9 Left Calcaneous: ABD pad Gauze and Kerlix/Conform Dressing Change Frequency: Wound #12 Left,Plantar,Circumferential Foot: Change dressing every day. Wound #9 Left Calcaneous: Change dressing every day. Follow-up Appointments: Wound #12 Left,Plantar,Circumferential Foot: Other: - as patient is able Wound #9 Left Calcaneous: Other: - as patient is able Off-Loading: Wound #12 Left,Plantar,Circumferential Foot: Open toe surgical shoe with peg assist. Wound #9 Left Calcaneous: Open toe surgical shoe with peg assist. Home Health: Wound #12 Left,Plantar,Circumferential Foot: Continue Home Health Visits - [facility] - only for PT and wound care supplies Home Health Nurse may visit [facility] to address patient s wound care needs. FACE TO FACE ENCOUNTER: MEDICARE and MEDICAID PATIENTS: I certify that this patient is under my care and that I had a face-to-face encounter that meets the physician face-to-face encounter requirements with this patient on this date. The encounter with the patient was in whole or in part for the following MEDICAL CONDITION: (primary reason for Home Healthcare) MEDICAL NECESSITY: I certify, that based on my findings, NURSING services are a medically necessary home health service. HOME [place], [name] ([id number]) BOUND STATUS: I certify that my clinical findings support that this patient is homebound (i.e., Due to illness or injury, pt requires aid of supportive devices such as crutches, cane, wheelchairs, walkers, the use of special transportation or the assistance of another person to leave their place of residence. There is a normal inability to leave the home and doing so requires considerable and taxing effort. Other absences are for medical  reasons / religious services and are infrequent or of short duration when for other reasons). If current dressing causes regression in wound condition, may D/C ordered dressing product/s and apply Normal Saline Moist Dressing daily until next Wound Healing Center / Other MD appointment. Notify Wound Healing Center of regression in wound condition at [phone]. Please direct any NON-WOUND related issues/requests for orders to patient's Primary Care Physician Wound #9 Left Calcaneous: Continue Home Health Visits - [facility] - only for PT and wound care supplies Home Health Nurse may visit [facility] to address patient s wound care  needs. FACE TO FACE ENCOUNTER: MEDICARE and MEDICAID PATIENTS: I certify that this patient is under my care and that I had a face-to-face encounter that meets the physician face-to-face encounter requirements with this patient on this date. The encounter with the patient was in whole or in part for the following MEDICAL CONDITION: (primary reason for Home Healthcare) MEDICAL NECESSITY: I certify, that based on my findings, NURSING services are a medically necessary home health service. HOME BOUND STATUS: I certify that my clinical findings support that this patient is homebound (i.e., Due to illness or injury, pt requires aid of supportive devices such as crutches, cane, wheelchairs, walkers, the use of special transportation or the assistance of another person to leave their place of residence. There is a normal inability to leave the home and doing so requires considerable and taxing effort. Other absences are for medical reasons / religious services and are infrequent or of short duration when for other reasons). If current dressing causes regression in wound condition, may D/C ordered dressing product/s and apply Normal Saline Moist Dressing daily until next Wound Healing Center / Other MD appointment. Notify Wound Healing Center of regression in wound condition at  [phone]. Please direct any NON-WOUND related issues/requests for orders to patient's Primary Care Physician We are going to continue with the Aquacel Ag to the left foot. They're also pending this area with bedded 9. I have crosshatched the left heel and will begin treatment with Santyl change daily. The object here will be to lift off the eschar so we can more quickly attempt to heal the left heel. By my understanding as much revascularization at [facility] on this leg has been done already. Electronic Signature(s) Signed: 01/23/2016 7:59:30 AM By: Baltazar Najjar MD Entered By: Baltazar Najjar on 01/21/2016 13:46:12 [name] ([id number]) -------------------------------------------------------------------------------- Loran Senters Date of Service: 01/21/2016 Patient Name: N. Patient Account Number: [id number] Medical Record Treating RN: [name] [id number] Number: Other Clinician: 03-Jan-1981 (34 y.o. 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 native arteries of extremities with gangrene, right leg L89.622 Pressure ulcer of left heel, stage 2 Z89.511 Acquired absence of right leg below knee Facility Procedures CPT4 Code: 78675449 Description: 11042 - DEB SUBQ TISSUE 20 SQ CM/< ICD-10 Description Diagnosis E10.621 Type 1 diabetes mellitus with foot ulcer Modifier: Quantity: 1 CPT4 Code: 20100712 Description: 11045 - DEB SUBQ TISS EA ADDL 20CM ICD-10 Description Diagnosis E10.621 Type 1 diabetes mellitus with foot  ulcer Modifier: Quantity: 1 Physician Procedures CPT4 Code: 1975883 Description: 11042 - WC PHYS SUBQ TISS 20 SQ CM ICD-10 Description Diagnosis E10.621 Type 1 diabetes mellitus with foot ulcer Modifier: Quantity: 1 CPT4 Code: 2549826 [name], HEATH Description: 11045 - WC PHYS SUBQ TISS EA ADDL 20 CM ICD-10 Description Diagnosis ER N. ([id number]) Modifier: Quantity: 1 Electronic Signature(s) Signed: 01/23/2016 7:59:30 AM By: Baltazar Najjar MD Entered By: Baltazar Najjar on 01/21/2016 13:46:48

## 2016-01-23 NOTE — Progress Notes (Signed)
[name], [name] ([id number]) Visit Report for 01/21/2016 Arrival Information Details [name], [name] Date of Service: 01/21/2016 12:45 PM Patient Name: N. Patient Account Number: [id number] Medical Record Treating RN: [name] [id number] Number: Other Clinician: [birth year] (35 y.o. Treating [name], [name] Date of Birth/Sex: Female) Physician/Extender: G Primary Care [name] Physician: Referring Physician: [name] in Treatment: 20 Visit Information History Since Last Visit Added or deleted any medications: No Patient Arrived: Wheel Chair Any new allergies or adverse reactions: No Arrival Time: 12:50 Had a fall or experienced change in No activities of daily living that may affect Accompanied By: mom risk of falls: Transfer Assistance: None Signs or symptoms of abuse/neglect since last No Patient Identification Verified: Yes visito Secondary Verification Process Yes Hospitalized since last visit: No Completed: Pain Present Now: No Patient Requires Transmission-Based No Precautions: Patient Has Alerts: Yes Electronic Signature(s) Signed: 01/21/2016 5:03:54 PM By: [name] Entered By: [name] on 01/21/2016 12:57:49 [name] ([id number]) -------------------------------------------------------------------------------- Encounter Discharge Information Details [name] Date of Service: 01/21/2016 12:45 PM Patient Name: N. Patient Account Number: [id number] Medical Record Treating RN: [name] [id number] Number: Other Clinician: December 13, 1980 (35 y.o. Treating [name], [name] Date of Birth/Sex: Female) Physician/Extender: G Primary Care [name] Physician: Referring Physician: [name] in Treatment: 20 Encounter Discharge Information Items Discharge Pain Level: 0 Discharge Condition: Stable Ambulatory Status: Wheelchair Discharge Destination: Home Transportation: Private  Auto Accompanied By: mom Schedule Follow-up Appointment: Yes Medication Reconciliation completed and provided to Patient/Care No Slayden Mennenga: Provided on Clinical Summary of Care: 01/21/2016 Form Type Recipient Paper Patient HB Electronic Signature(s) Signed: 01/21/2016 1:53:44 PM By: Gwenlyn Perking Entered By: Gwenlyn Perking on 01/21/2016 13:53:44 [name] ([id number]) -------------------------------------------------------------------------------- Multi Wound Chart Details [name] Date of Service: 01/21/2016 12:45 PM Patient Name: N. Patient Account Number: [id number] Medical Record Treating RN: [name] [id number] Number: Other Clinician: 1981-02-18 (35 y.o. 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] [9:the Lower Extremity] Comorbid History: [12:Anemia, Type I Diabetes, Anemia, Type I Diabetes, N/A End Stage Renal Disease, End Stage Renal Disease, Rheumatoid Arthritis, Neuropathy] [9:Rheumatoid Arthritis, Neuropathy] Date Acquired: [12:07/28/2015] [9:07/28/2015] [N/A:N/A] Weeks of Treatment: [12:20] [9:20] [N/A:N/A] Wound Status: [12:Open] [9:Open] [N/A:N/A] Pending Amputation on Yes [9:No] [N/A:N/A] Presentation: Measurements L x W x D 8.8x6.6x0.2 [9:3.7x5.5x0.1] [N/A:N/A] (cm) Area (cm) : [12:45.616] [9:15.983] [N/A:N/A] Volume (cm) : [12:9.123]  [9:1.598] [N/A:N/A] % Reduction in Area: [12:-5.60%] [9:14.00%] [N/A:N/A] % Reduction in Volume: -111.20% [9:13.90%] [N/A:N/A] Classification: [12:Grade 2] [9:Grade 2] [N/A:N/A] Exudate Amount: [12:Medium] [9:Small] [N/A:N/A] Exudate Type: [12:Serous] [9:Serous] [N/A:N/A] Exudate Color: [12:amber Yes] [9:amber Yes] [N/A:N/A N/A] Foul Odor After Cleansing: Odor Anticipated Due to No No N/A Product Use: Wound Margin: Indistinct, nonvisible Distinct, outline attached N/A Granulation Amount: Small (1-33%) Small (1-33%) N/A Granulation Quality: Red Pink N/A Necrotic Amount: Large (67-100%) Large (67-100%) N/A Necrotic Tissue: Eschar, Adherent Slough Eschar, Adherent Slough N/A Exposed Structures: Fascia: No Fascia: No N/A Fat: No Fat: No Tendon: No Tendon: No Muscle: No Muscle: No Joint: No Joint: No Bone: No Bone: No Limited to Skin Limited to Skin Breakdown Breakdown  Epithelialization: None None N/A Periwound Skin Texture: Edema: Yes Edema: No N/A Excoriation: No Excoriation: No Induration: No Induration: No Callus: No Callus: No Crepitus: No Crepitus: No Fluctuance: No Fluctuance: No Friable: No Friable: No Rash: No Rash: No Scarring: No Scarring: No Periwound Skin Moist: Yes Moist: Yes N/A Moisture: Maceration: No Maceration: No Dry/Scaly: No Dry/Scaly: No Periwound Skin Color: Atrophie Blanche: No Atrophie Blanche: No N/A Cyanosis: No Cyanosis: No Ecchymosis: No Ecchymosis: No Erythema: No Erythema: No Hemosiderin Staining: No Hemosiderin Staining: No Mottled: No Mottled: No Pallor: No Pallor: No Rubor: No Rubor: No Temperature: No Abnormality No Abnormality N/A Tenderness on No Yes N/A Palpation: Wound Preparation: Ulcer Cleansing: Ulcer Cleansing: N/A Rinsed/Irrigated with Rinsed/Irrigated with Saline Saline Topical Anesthetic Topical Anesthetic Applied: None Applied: Other: lidocaine 4% Treatment Notes [name], [name]  ([id number]) Electronic Signature(s) Signed: 01/21/2016 5:03:54 PM By: [name] Entered By: [name] on 01/21/2016 13:14:27 [name] ([id number]) -------------------------------------------------------------------------------- Multi-Disciplinary Care Plan Details [name] Date of Service: 01/21/2016 12:45 PM Patient Name: N. Patient Account Number: [id number] Medical Record Treating RN: [name] [id number] Number: Other Clinician: 1981-02-28 (34 y.o. Treating [name], [name] Date of Birth/Sex: Female) Physician/Extender: G Primary Care [name] Physician: Referring Physician: [name] in Treatment: 20 Active Inactive HBO Nursing Diagnoses: Anxiety related to feelings of confinement associated with the hyperbaric oxygen chamber Anxiety related to knowledge deficit of hyperbaric oxygen therapy and treatment procedures Discomfort related to temperature and humidity changes inside hyperbaric chamber Potential for barotraumas to ears, sinuses, teeth, and lungs or cerebral gas embolism related to changes in atmospheric pressure inside hyperbaric oxygen chamber Potential for oxygen toxicity seizures related to delivery of 100% oxygen at an increased atmospheric pressure Potential for pulmonary oxygen toxicity related to delivery of 100% oxygen at an increased atmospheric pressure Goals: Barotrauma will be prevented during HBO2 Date Initiated: 08/30/2015 Goal Status: Active Patient and/or family will be able to state/discuss factors appropriate to the management of their disease process during treatment Date Initiated: 08/30/2015 Goal Status: Active Patient will tolerate the hyperbaric oxygen therapy treatment Date Initiated: 08/30/2015 Goal Status: Active Patient will tolerate the internal climate of the chamber Date Initiated: 08/30/2015 Goal Status: Active Patient/caregiver will verbalize understanding of HBO goals, rationale,  procedures and potential hazards Date Initiated: 08/30/2015 Goal Status: Active Signs and symptoms of pulmonary oxygen toxicity will be recognized and promptly addressed Date Initiated: 08/30/2015 [name] ([id number]) Goal Status: Active Signs and symptoms of seizure will be recognized and promptly addressed ; seizing patients will suffer no harm Date Initiated: 08/30/2015 Goal Status: Active Interventions: Administer a five (5) minute air break for patient if signs and symptoms of seizure appear and notify the hyperbaric physician Administer a ten (10) minute air break for patient if signs and symptoms of seizure appear and notify the hyperbaric physician Administer decongestants, per physician orders, prior to HBO2 Administer the correct therapeutic gas delivery based on the patients needs and limitations, per physician order Assess and provide for patientos comfort related to the hyperbaric environment and equalization of middle ear Assess for signs and symptoms related to adverse events, including but not limited to confinement anxiety, pneumothorax, oxygen toxicity and baurotrauma Assess patient for any history of confinement anxiety Assess patient's knowledge and expectations regarding hyperbaric medicine and provide education related to the hyperbaric environment, goals of treatment and prevention of adverse events Implement protocols to decrease risk of pneumothorax in high risk patients Notes: Abuse / Safety / Falls / Self Care  Management Nursing Diagnoses: Potential for falls Goals: Patient will remain injury free Date Initiated: 09/19/2015 Goal Status: Active Patient/caregiver will verbalize/demonstrate measures taken to prevent injury and/or falls Date Initiated: 09/19/2015 Goal Status: Active Interventions: Assess fall risk on admission and as needed Assess impairment of mobility on admission and as needed per policy Notes: Orientation to the Wound  Care Program Nursing Diagnoses: SHALI, VESEY ([id number]) Knowledge deficit related to the wound healing center program Goals: Patient/caregiver will verbalize understanding of the Wound Healing Center Program Date Initiated: 08/30/2015 Goal Status: Active Interventions: Provide education on orientation to the wound center Notes: Pressure Nursing Diagnoses: Knowledge deficit related to causes and risk factors for pressure ulcer development Knowledge deficit related to management of pressures ulcers Potential for impaired tissue integrity related to pressure, friction, moisture, and shear Goals: Patient will remain free from development of additional pressure ulcers Date Initiated: 08/30/2015 Goal Status: Active Patient will remain free of pressure ulcers Date Initiated: 08/30/2015 Goal Status: Active Patient/caregiver will verbalize risk factors for pressure ulcer development Date Initiated: 08/30/2015 Goal Status: Active Patient/caregiver will verbalize understanding of pressure ulcer management Date Initiated: 08/30/2015 Goal Status: Active Interventions: Assess: immobility, friction, shearing, incontinence upon admission and as needed Assess offloading mechanisms upon admission and as needed Assess potential for pressure ulcer upon admission and as needed Provide education on pressure ulcers Treatment Activities: Patient referred for home evaluation of offloading devices/mattresses : 01/15/2016 Patient referred for pressure reduction/relief devices : 01/15/2016 Pressure reduction/relief device ordered : 01/15/2016 Notes: CRISOL, MUECKE ([id number]) Wound/Skin Impairment Nursing Diagnoses: Impaired tissue integrity Knowledge deficit related to ulceration/compromised skin integrity Goals: Patient will have a decrease in wound volume by X% from date: (specify in notes) Date Initiated: 08/30/2015 Goal Status: Active Patient/caregiver will verbalize understanding of  skin care regimen Date Initiated: 08/30/2015 Goal Status: Active Ulcer/skin breakdown will have a volume reduction of 30% by week 4 Date Initiated: 08/30/2015 Goal Status: Active Ulcer/skin breakdown will have a volume reduction of 50% by week 8 Date Initiated: 08/30/2015 Goal Status: Active Ulcer/skin breakdown will have a volume reduction of 80% by week 12 Date Initiated: 08/30/2015 Goal Status: Active Ulcer/skin breakdown will heal within 14 weeks Date Initiated: 08/30/2015 Goal Status: Active Interventions: Assess patient/caregiver ability to obtain necessary supplies Assess patient/caregiver ability to perform ulcer/skin care regimen upon admission and as needed Assess ulceration(s) every visit Provide education on ulcer and skin care Notes: Electronic Signature(s) Signed: 01/21/2016 5:03:54 PM By: [name] Entered By: [name] on 01/21/2016 13:14:09 [name] ([id number]) -------------------------------------------------------------------------------- Patient/Caregiver Education Details [name] Date of Service: 01/21/2016 12:45 PM Patient Name: N. Patient Account Number: [id number] Medical Record Treating RN: [name] [id number] Number: Other Clinician: 09/14/1981 (34 y.o. Treating [name], [name] Date of Birth/Gender: Female) Physician/Extender: G Primary Care Weeks in Treatment: 32 [name] Physician: Referring Physician: [name] Education Assessment Education Provided To: Patient and Caregiver Education Topics Provided Wound/Skin Impairment: Handouts: Other: new wound care as ordered Methods: Demonstration, Explain/Verbal Responses: State content correctly Electronic Signature(s) Signed: 01/21/2016 5:03:54 PM By: [name] Entered By: [name] on 01/21/2016 13:37:34 [name] ([id number]) -------------------------------------------------------------------------------- Wound Assessment  Details [name] Date of Service: 01/21/2016 12:45 PM Patient Name: N. Patient Account Number: [id number] Medical Record Treating RN: [name] [id number] Number: Other Clinician: 04-Feb-1981 (34 y.o. Treating [name], [name] Date of Birth/Sex: Female) Physician/Extender: G Primary Care [name] Physician: Referring Physician: [name] in Treatment: 20 Wound Status Wound Number: 12 Primary Diabetic Wound/Ulcer of the  Lower Etiology: Extremity Wound Location: Left Foot - Plantar, Circumfernential Wound Open Status: Wounding Event: Gradually Appeared Comorbid Anemia, Type I Diabetes, End Stage Date Acquired: 07/28/2015 History: Renal Disease, Rheumatoid Arthritis, Weeks Of Treatment: 20 Neuropathy Clustered Wound: No Pending Amputation On Presentation Photos Photo Uploaded By: [name] on 01/21/2016 15:59:27 Wound Measurements Length: (cm) 8.8 Width: (cm) 6.6 Depth: (cm) 0.2 Area: (cm) 45.616 Volume: (cm) 9.123 % Reduction in Area: -5.6% % Reduction in Volume: -111.2% Epithelialization: None Tunneling: No Undermining: No Wound Description Classification: Grade 2 Wound Margin: Indistinct, nonvisible Exudate Amount: Medium Exudate Type: Serous Exudate Color: amber Mellette, Agripina N. ([id number]) Foul Odor After Cleansing: Yes Due to Product Use: No Wound Bed Granulation Amount: Small (1-33%) Exposed Structure Granulation Quality: Red Fascia Exposed: No Necrotic Amount: Large (67-100%) Fat Layer Exposed: No Necrotic Quality: Eschar, Adherent Slough Tendon Exposed: No Muscle Exposed: No Joint Exposed: No Bone Exposed: No Limited to Skin Breakdown Periwound Skin Texture Texture Color No Abnormalities Noted: No No Abnormalities Noted: No Callus: No Atrophie Blanche: No Crepitus: No Cyanosis: No Excoriation: No Ecchymosis: No Fluctuance: No Erythema: No Friable: No Hemosiderin Staining: No Induration:  No Mottled: No Localized Edema: Yes Pallor: No Rash: No Rubor: No Scarring: No Temperature / Pain Moisture Temperature: No Abnormality No Abnormalities Noted: No Dry / Scaly: No Maceration: No Moist: Yes Wound Preparation Ulcer Cleansing: Rinsed/Irrigated with Saline Topical Anesthetic Applied: None Treatment Notes Wound #12 (Left, Plantar, Circumferential Foot) 1. Cleansed with: Clean wound with Normal Saline 4. Dressing Applied: Aquacel Ag Other dressing (specify in notes) 5. Secondary Dressing Applied Guaze, ABD and kerlix/Conform Notes betadine paint and stretch netting Electronic Signature(s) Signed: 01/21/2016 5:03:54 PM By: Donella Stade, Ledell Peoples ([id number]) Entered By: [name] on 01/21/2016 13:13:24 [name] ([id number]) -------------------------------------------------------------------------------- Wound Assessment Details [name] Date of Service: 01/21/2016 12:45 PM Patient Name: N. Patient Account Number: [id number] Medical Record Treating RN: [name] [id number] Number: Other Clinician: 01/15/1981 (34 y.o. Treating [name], [name] Date of Birth/Sex: Female) Physician/Extender: G Primary Care [name] Physician: Referring Physician: [name] in Treatment: 20 Wound Status Wound Number: 9 Primary Diabetic Wound/Ulcer of the Lower Etiology: Extremity Wound Location: Left Calcaneous Wound Open Wounding Event: Pressure Injury Status: Date Acquired: 07/28/2015 Comorbid Anemia, Type I Diabetes, End Stage Weeks Of Treatment: 20 History: Renal Disease, Rheumatoid Arthritis, Clustered Wound: No Neuropathy Photos Photo Uploaded By: [name] on 01/21/2016 16:01:38 Wound Measurements Length: (cm) 3.7 Width: (cm) 5.5 Depth: (cm) 0.1 Area: (cm) 15.983 Volume: (cm) 1.598 % Reduction in Area: 14% % Reduction in Volume: 13.9% Epithelialization: None Tunneling: No Undermining:  No Wound Description Classification: Grade 2 Foul Odor Aft Wound Margin: Distinct, outline attached Due to Produc Exudate Amount: Small Exudate Type: Serous Exudate Color: amber er Cleansing: Yes t Use: No Wound Bed [employer]el, Arihanna N. ([id number]) Granulation Amount: Small (1-33%) Exposed Structure Granulation Quality: Pink Fascia Exposed: No Necrotic Amount: Large (67-100%) Fat Layer Exposed: No Necrotic Quality: Eschar, Adherent Slough Tendon Exposed: No Muscle Exposed: No Joint Exposed: No Bone Exposed: No Limited to Skin Breakdown Periwound Skin Texture Texture Color No Abnormalities Noted: No No Abnormalities Noted: No Callus: No Atrophie Blanche: No Crepitus: No Cyanosis: No Excoriation: No Ecchymosis: No Fluctuance: No Erythema: No Friable: No Hemosiderin Staining: No Induration: No Mottled: No Localized Edema: No Pallor: No Rash: No Rubor: No Scarring: No Temperature / Pain Moisture Temperature: No Abnormality No Abnormalities Noted: No Tenderness on Palpation: Yes Dry / Scaly: No Maceration: No Moist:  Yes Wound Preparation Ulcer Cleansing: Rinsed/Irrigated with Saline Topical Anesthetic Applied: Other: lidocaine 4%, Treatment Notes Wound #9 (Left Calcaneous) 1. Cleansed with: Clean wound with Normal Saline 2. Anesthetic Topical Lidocaine 4% cream to wound bed prior to debridement 4. Dressing Applied: Santyl Ointment 5. Secondary Dressing Applied Guaze, ABD and kerlix/Conform Notes stretch netting Electronic Signature(s) Signed: 01/21/2016 5:03:54 PM By: Donella Stade, Ledell Peoples ([id number]) Entered By: [name] on 01/21/2016 13:13:53 [name] ([id number]) -------------------------------------------------------------------------------- Vitals Details [name] Date of Service: 01/21/2016 12:45 PM Patient Name: N. Patient Account Number: [id number] Medical Record Treating RN: [name] [id number] Number: Other Clinician: 1980/10/29 (34 y.o. Treating [name], [name] Date of Birth/Sex: Female) Physician/Extender: G Primary Care [name] Physician: Referring Physician: [name] in Treatment: 20 Vital Signs Time Taken: 12:58 Temperature (F): 98.1 Height (in): 68 Pulse (bpm): 80 Weight (lbs): 96 Respiratory Rate (breaths/min): 16 Body Mass Index (BMI): 14.6 Blood Pressure (mmHg): 135/98 Reference Range: 80 - 120 mg / dl Electronic Signature(s) Signed: 01/21/2016 5:03:54 PM By: [name] Entered By: [name] on 01/21/2016 13:00:44

## 2016-01-28 ENCOUNTER — Encounter: Payer: Medicare Other | Attending: Internal Medicine | Admitting: Internal Medicine

## 2016-01-28 DIAGNOSIS — Z681 Body mass index (BMI) 19 or less, adult: Secondary | ICD-10-CM | POA: Diagnosis not present

## 2016-01-28 DIAGNOSIS — N186 End stage renal disease: Secondary | ICD-10-CM | POA: Insufficient documentation

## 2016-01-28 DIAGNOSIS — I70261 Atherosclerosis of native arteries of extremities with gangrene, right leg: Secondary | ICD-10-CM | POA: Insufficient documentation

## 2016-01-28 DIAGNOSIS — E1052 Type 1 diabetes mellitus with diabetic peripheral angiopathy with gangrene: Secondary | ICD-10-CM | POA: Diagnosis not present

## 2016-01-28 DIAGNOSIS — E1022 Type 1 diabetes mellitus with diabetic chronic kidney disease: Secondary | ICD-10-CM | POA: Insufficient documentation

## 2016-01-28 DIAGNOSIS — Z89511 Acquired absence of right leg below knee: Secondary | ICD-10-CM | POA: Insufficient documentation

## 2016-01-28 DIAGNOSIS — E43 Unspecified severe protein-calorie malnutrition: Secondary | ICD-10-CM | POA: Diagnosis not present

## 2016-01-28 DIAGNOSIS — L89622 Pressure ulcer of left heel, stage 2: Secondary | ICD-10-CM | POA: Diagnosis not present

## 2016-01-28 DIAGNOSIS — Z992 Dependence on renal dialysis: Secondary | ICD-10-CM | POA: Diagnosis not present

## 2016-01-28 DIAGNOSIS — I70245 Atherosclerosis of native arteries of left leg with ulceration of other part of foot: Secondary | ICD-10-CM | POA: Insufficient documentation

## 2016-01-28 DIAGNOSIS — F419 Anxiety disorder, unspecified: Secondary | ICD-10-CM | POA: Insufficient documentation

## 2016-01-28 DIAGNOSIS — M069 Rheumatoid arthritis, unspecified: Secondary | ICD-10-CM | POA: Insufficient documentation

## 2016-01-28 DIAGNOSIS — E10621 Type 1 diabetes mellitus with foot ulcer: Secondary | ICD-10-CM | POA: Insufficient documentation

## 2016-01-29 NOTE — Progress Notes (Signed)
[name] [name], [name] ([id number]) Visit Report for 01/28/2016 Chief Complaint Document Details [name] [name], [name] [name] Date of Service: 01/28/2016 2:15 PM Patient Name: N. Patient Account Number: [id number] Medical Record Treating RN: [name] [name] [id number] Number: Other Clinician: Apr 29, 1981 (34 y.o. Treating [name] [name], [name] [name] Date of Birth/Sex: Female) Physician/Extender: G Primary Care [name] Physician: Referring Physician: [name] [name] in Treatment: 21 Information Obtained from: Patient Chief Complaint Patient in today for treatment of non-healing wound and HBO Treatment. she has just gotten out of [name] this week and is back to resume her hyperbaric oxygen therapy Electronic Signature(s) Signed: 01/29/2016 8:00:31 AM By: [name] Najjar MD Entered By: [name] [name] on 01/28/2016 14:43:57 [name] [name] ([id number]) -------------------------------------------------------------------------------- Debridement Details [name] [name] Date of Service: 01/28/2016 2:15 PM Patient Name: N. Patient Account Number: [id number] Medical Record Treating RN: [name] [name] [id number] Number: Other Clinician: 09-01-1981 (34 y.o. Treating [name] [name], [name] [name] Date of Birth/Sex: Female) Physician/Extender: G Primary Care [name] Physician: Referring Physician: [name] [name] in Treatment: 21 Debridement Performed for Wound #9 Left Calcaneous Assessment: Performed By: Physician [name] Caul, MD Debridement: Debridement Pre-procedure Yes Verification/Time Out Taken: Start Time: 14:36 Pain Control: Lidocaine 4% Topical Solution Level: Skin/Subcutaneous Tissue Total Area Debrided (L x 4.5 (cm) x 4.8 (cm) = 21.6 (cm) W): Tissue and other Non-Viable, Eschar, Subcutaneous material debrided: Instrument: Curette Bleeding: Minimum Hemostasis Achieved: Pressure End Time: 14:41 Procedural Pain: 0 Post Procedural Pain: 0 Response to Treatment: Procedure  was tolerated well Post Debridement Measurements of Total Wound Length: (cm) 4.5 Width: (cm) 4.8 Depth: (cm) 0.2 Volume: (cm) 3.393 Post Procedure Diagnosis Same as Pre-procedure Electronic Signature(s) Signed: 01/28/2016 4:42:26 PM By: [name] [name] Signed: 01/29/2016 8:00:31 AM By: [name] Najjar MD Entered By: [name] [name] on 01/28/2016 14:43:29 [name] [name] ([id number]) [name] [name], [name] [name] ([id number]) -------------------------------------------------------------------------------- HPI Details [name] Date of Service: 01/28/2016 2:15 PM Patient Name: N. Patient Account Number: [id number] Medical Record Treating RN: [name] [name] [id number] Number: Other Clinician: [birth year] (34 y.o. Treating [name] [name], [name] [name] Date of Birth/Sex: Female) Physician/Extender: G Primary Care [name] Physician: Referring Physician: [name] [name] in Treatment: 21 History of Present Illness Location: dry gangrene both feet and heels Quality: Patient reports No Pain. Severity: Patient states wound are getting worse. Duration: Patient has had the wound for > 4months prior to seeking treatment at the wound [name] Context: The wound appeared gradually over time Modifying Factors: she has been in and out of [name] over the last 2 months Associated Signs and Symptoms: Patient reports having difficulty standing for long periods. HPI Description: [name] [name] is a 35 y.o. female who presents to our wound [name], back in June 2016, referred by her PCP Dr. Zada [name] for nonhealing ulcers on the lateral aspect of the right heel. Of note she has a history of type 1 diabetes mellitus that has been uncontrolled. Past medical history significant for type 1 diabetes mellitus not controlled, ankylosing spondylitis, anorexia nervosa, irritable bowel syndrome, chronic kidney disease, chronic diarrhea. she then developed gangrene of both feet due to severe peripheral vascular  disease and also had gangrene of the tips of her fingers due to upper extremity vascular disease. She was being worked up by vascular surgery at [name] and at [name] and has had several procedures done there. She started with hyperbaric oxygen therapy and had a total of 40 treatments the last one being on 06/20/2015. After the initial treatment of hyperbaric oxygen therapy she started having ear problems and had ultimately to use myringotomy tubes and this  was done bilaterally. Since then her ears have been doing fine. In late September, she had seen vascular and hand surgeons. since then she's been in [name] at the [name] for surgery involving extensive vascular procedures for the upper extremities. She was then at [name] with GI bleeds both upper and lower and has been in and out of [name] for that. She has recently been out of [name] for the last week. 09/09/2015 -- she was unable to get here in time to start her hyperbaric oxygen today and hence is only here for a wound care visit. 09/19/2015 -- she has been having vancomycin during her dialysis and continues to have vascular appointments and the procedure is been set for early January. She has been unable to make it for her hemodialysis due to various medical symptoms. 09/30/2014 -- her vancomycin was stopped on 09/25/2015 and the mother has noticed the right foot has started draining for the last 3 days. Addendum: after examining the patient I was able to talk to her primary vascular surgeon Dr. Pernell [name] at the [name]. I told him about the necrotic area on the plantar aspect of right foot which is now wet gangrene and he agreed with me that he would admit her at [name] under her care and synchronize further treatment. We have discussed her poor prognosis and he and I discussed the need for hospice care [name] [name], [name] [name]. ([id number]) and for sitting down and talking to the patient and her mother and giving them a  proper detail of the prognosis. 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 She was worked up with MRIs, abdomen aortogram and placement of left-sided angioplasties were done. After a prolonged [name] she she was discharged home and was told to wear shrinker sock and stump protector and see her surgeon for further instructions regarding wound care and suture removal. Asked to take long-term doxycycline. 01/15/16; this is a patient I haven't seen before although she is been followed by Dr. Meyer Russel in this clinic today. She is a type I diabetic with severe PAD macrovascular disease. She has had a previous BKA. She has dry gangrene of the tips of her fingers which she showed me on the right to. She also has dry gangrene of the left first second and third toes and a portion of her proximal foot around these areas. She is followed by vascular surgery at [facility] and saw them recently they are not going to do surgery as of yet. She has a large black eschar over her heel which is beginning to separate in some areas. As I understand think she is paining these with Betadine. There is been some suggestion about retrying hyperbarics on her although she is still not able to commit to the frequency of treatment that would be necessary to see improvement. She is also on Monday Wednesday Friday dialysis 01/21/16; the patient returns to see me today with regards to the left heel. Apparently she is not scheduled for any further attempts at revascularization of the left foot. She has dry gangrene of the left medial foot,  first and second toes and there is already some  separation developing here. 01/28/16; the patient returns today for attention to the left foot specifically the left calcaneal ulcer. This is covered in a thick black eschar. I crosshatched this last week and we have been using Santyl. Electronic Signature(s) Signed: 01/29/2016 8:00:31 AM By: [name] Najjar MD Entered By: [name] [name] on 01/28/2016 14:44:54 [name] [name] ([id number]) -------------------------------------------------------------------------------- Physical Exam Details [name] Date of Service: 01/28/2016 2:15 PM Patient Name: N. Patient Account Number: [id number] Medical Record Treating RN: [name] [name] [id number] Number: Other Clinician: Jan 20, 1981 (34 y.o. Treating [name] [name], [name] [name] Date of Birth/Sex: Female) Physician/Extender: G Primary Care [name] Physician: Referring Physician: [name] [name] in Treatment: 21 Notes The left medial foot and first second toes have dry gangrene they do not appear to be infected. The black eschar is separating. Thankfully underneath this there appears to be healthy granulation this will require several rounds of Santyl and ongoing debridement to the parts of the eschar that are separating. There is no evidence of infection Electronic Signature(s) Signed: 01/29/2016 8:00:31 AM By: [name] Najjar MD Entered By: [name] [name] on 01/28/2016 14:46:01 [name] [name] ([id number]) -------------------------------------------------------------------------------- Physician Orders Details [name] Date of Service: 01/28/2016 2:15 PM Patient Name: N. Patient Account Number: [id number] Medical Record Treating RN: [name] [name] [id number] Number: Other Clinician: September 19, 1981 (34 y.o. Treating [name] [name], [name] [name] Date of Birth/Sex: Female) Physician/Extender: G Primary Care [name] Physician: Referring Physician: [name] [name] in  Treatment: 15 Verbal / Phone Orders: Yes Clinician: Curtis [name] Read Back and Verified: Yes Diagnosis Coding Wound Cleansing Wound #12 Left,Plantar,Circumferential Foot o Cleanse wound with mild soap and water Wound #9 Left Calcaneous o Cleanse wound with mild soap and water Primary Wound Dressing Wound #12 Left,Plantar,Circumferential Foot o Aquacel Ag - to wet areas ****PLEASE ORDER THE 10CM / 4IN X 5 IN SQUARES ****** o Other: - betadine STICKS paint to dry areas ****PLEASE ORDER SOME FOR PT***** Wound #9 Left Calcaneous o Santyl Ointment Secondary Dressing Wound #12 Left,Plantar,Circumferential Foot o ABD pad o Gauze and Kerlix/Conform Wound #9 Left Calcaneous o ABD pad o Gauze and Kerlix/Conform Dressing Change Frequency Wound #12 Left,Plantar,Circumferential Foot o Change dressing every day. Wound #9 Left Calcaneous o Change dressing every day. [name] [name], [name] [name] ([id number]) Follow-up Appointments Wound #12 Left,Plantar,Circumferential Foot o Other: - as patient is able Wound #9 Left Calcaneous o Other: - as patient is able Off-Loading Wound #12 Left,Plantar,Circumferential Foot o Open toe surgical shoe with peg assist. Wound #9 Left Calcaneous o Open toe surgical shoe with peg assist. Electronic Signature(s) Signed: 01/28/2016 4:42:26 PM By: [name] [name] Signed: 01/29/2016 8:00:31 AM By: [name] Najjar MD Entered By: [name] [name] on 01/28/2016 14:42:02 [name] [name] ([id number]) -------------------------------------------------------------------------------- Problem List Details [name] [name] Date of Service: 01/28/2016 2:15 PM Patient Name: N. Patient Account Number: [id number] Medical Record Treating RN: [name] [name] [id number] Number: Other Clinician: 1981/02/21 (34 y.o. Treating [name] [name], [name] [name] Date of Birth/Sex: Female) Physician/Extender: G Primary Care [name] [name] Physician: Referring Physician:  [name] [name] in Treatment: 21 Active Problems ICD-10 Encounter Code Description Active Date Diagnosis E10.621 Type 1 diabetes mellitus with foot ulcer 08/30/2015 Yes E10.52 Type 1 diabetes mellitus with diabetic peripheral 08/30/2015 Yes angiopathy with gangrene I70.245 Atherosclerosis of native arteries of left leg with ulceration 08/30/2015 Yes of other part of foot I70.261 Atherosclerosis of native arteries of extremities with 08/30/2015 Yes gangrene, right leg L89.622 Pressure ulcer of left heel, stage 2 08/30/2015 Yes Z89.511 Acquired absence of right leg below knee 11/29/2015 Yes Inactive Problems Resolved  Problems ICD-10 Code Description Active Date Resolved Date L02.611 Cutaneous abscess of right foot 10/01/2015 10/01/2015 [name] [name], [name] [name] ([id number]) Electronic Signature(s) Signed: 01/29/2016 8:00:31 AM By: [name] Najjar MD Entered By: [name] [name] on 01/28/2016 14:43:16 [name] [name] ([id number]) -------------------------------------------------------------------------------- Progress Note Details [name] Date of Service: 01/28/2016 2:15 PM Patient Name: N. Patient Account Number: [id number] Medical Record Treating RN: [name] [name] [id number] Number: Other Clinician: 12/27/1980 (34 y.o. Treating [name] [name], [name] [name] Date of Birth/Sex: Female) Physician/Extender: G Primary Care [name] Physician: Referring Physician: [name] [name] in Treatment: 21 Subjective Chief Complaint Information obtained from Patient Patient in today for treatment of non-healing wound and HBO Treatment. she has just gotten out of [name] this week and is back to resume her hyperbaric oxygen therapy History of Present Illness (HPI) The following HPI elements were documented for the patient's wound: Location: dry gangrene both feet and heels Quality: Patient reports No Pain. Severity: Patient states wound are getting worse. Duration: Patient has had the  wound for > 4months prior to seeking treatment at the wound [name] Context: The wound appeared gradually over time Modifying Factors: she has been in and out of [name] over the last 2 months Associated Signs and Symptoms: Patient reports having difficulty standing for long periods. [name] [name] is a 35 y.o. female who presents to our wound [name], back in June 2016, referred by her PCP Dr. Zada [name] for nonhealing ulcers on the lateral aspect of the right heel. Of note she has a history of type 1 diabetes mellitus that has been uncontrolled. Past medical history significant for type 1 diabetes mellitus not controlled, ankylosing spondylitis, anorexia nervosa, irritable bowel syndrome, chronic kidney disease, chronic diarrhea. she then developed gangrene of both feet due to severe peripheral vascular disease and also had gangrene of the tips of her fingers due to upper extremity vascular disease. She was being worked up by vascular surgery at [facility] and at [name] and has had several procedures done there. She started with hyperbaric oxygen therapy and had a total of 40 treatments the last one being on 06/20/2015. After the initial treatment of hyperbaric oxygen therapy she started having ear problems and had ultimately to use myringotomy tubes and this was done bilaterally. Since then her ears have been doing fine. In late September, she had seen vascular and hand surgeons. since then she's been in [place] at the rec [name] for surgery involving extensive vascular procedures for the upper extremities. She was then at [name] with GI bleeds both upper and lower and has been in and out of [name] for that. She has recently been out of [name] for the last week. 09/09/2015 -- she was unable to get here in time to start her hyperbaric oxygen today and hence is only here for a wound care visit. 09/19/2015 -- she has been having vancomycin during her dialysis and continues to have  vascular [name] [name], [name] ([id number]) appointments and the procedure is been set for early January. She has been unable to make it for her hemodialysis due to various medical symptoms. 09/30/2014 -- her vancomycin was stopped on 09/25/2015 and the mother has noticed the right foot has started draining for the last 3 days. Addendum: after examining the patient I was able to talk to her primary vascular surgeon Dr. Pernell [name] at the [name]. I told him about the necrotic area on the plantar aspect of right foot which is now wet gangrene and he agreed with me that he would admit her at  [name] under her care and synchronize further treatment. We have discussed her poor prognosis and he and I discussed the need for hospice care and for sitting down and talking to the patient and her mother and giving them a proper detail of the prognosis. 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 She was worked up with MRIs, abdomen aortogram and placement of left-sided angioplasties were done. After a prolonged [name] she she was discharged home and was told to wear shrinker sock and stump protector and see her surgeon for further instructions regarding wound care and suture removal. Asked to take long-term doxycycline. 01/15/16; this is a patient I haven't seen before although she is been followed by Dr. Meyer Russel in this clinic today. She is a type I diabetic with severe PAD macrovascular disease. She has had a previous BKA. She has dry gangrene of the tips of her fingers which she showed me on the right to.  She also has dry gangrene of the left first second and third toes and a portion of her proximal foot around these areas. She is followed by vascular surgery at [facility] and saw them recently they are not going to do surgery as of yet. She has a large black eschar over her heel which is beginning to separate in some areas. As I understand think she is paining these with Betadine. There is been some suggestion about retrying hyperbarics on her although she is still not able to commit to the frequency of treatment that would be necessary to see improvement. She is also on Monday Wednesday Friday dialysis 01/21/16; the patient returns to see me today with regards to the left heel. Apparently she is not scheduled for any further attempts at revascularization of the left foot. 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 01/28/16; the patient returns today for attention to the left foot specifically the left calcaneal ulcer. This is covered in a thick black eschar. I crosshatched this last week and we have been using Santyl. Objective Constitutional Vitals Time Taken: 2:21 PM, Height: 68 in, Weight: 96 lbs, BMI: 14.6, Pulse: 76 bpm, Respiratory Rate: 16 breaths/min, Blood Pressure: 88/68 mmHg. Integumentary (Hair, Skin) Wound #12 status is Open. Original cause of wound was Gradually Appeared. The wound is located on the Left,Plantar,Circumferential Foot. The wound measures 8.6cm length x 6.6cm width x 0.2cm depth; [name] [name], [name] ([id number]) 44.579cm^2 area and 8.916cm^3 volume. The wound is limited to skin breakdown. There is no tunneling or undermining noted. There is a medium amount of serous drainage noted. The wound margin is indistinct and nonvisible. There is small (1-33%) red granulation within the wound bed. There is a large (67-100%) amount of necrotic tissue within the wound bed including Eschar and Adherent  Slough. The periwound skin appearance exhibited: Localized Edema, Moist. The periwound skin appearance did not exhibit: Callus, Crepitus, Excoriation, Fluctuance, Friable, Induration, Rash, Scarring, Dry/Scaly, Maceration, Atrophie Blanche, Cyanosis, Ecchymosis, Hemosiderin Staining, Mottled, Pallor, Rubor, Erythema. Periwound temperature was noted as No Abnormality. Wound #9 status is Open. Original cause of wound was Pressure Injury. The  wound is located on the Left Calcaneous. The wound measures 4.5cm length x 4.8cm width x 0.1cm depth; 16.965cm^2 area and 1.696cm^3 volume. The wound is limited to skin breakdown. There is no tunneling or undermining noted. There is a small amount of serous drainage noted. The wound margin is distinct with the outline attached to the wound base. There is small (1-33%) pink granulation within the wound bed. There is a large (67-100%) amount of necrotic tissue within the wound bed including Eschar and Adherent Slough. The periwound skin appearance exhibited: Moist. The periwound skin appearance did not exhibit: Callus, Crepitus, Excoriation, Fluctuance, Friable, Induration, Localized Edema, Rash, Scarring, Dry/Scaly, Maceration, Atrophie Blanche, Cyanosis, Ecchymosis, Hemosiderin Staining, Mottled, Pallor, Rubor, Erythema. Periwound temperature was noted as No Abnormality. The periwound has tenderness on palpation. Assessment Active Problems ICD-10 E10.621 - Type 1 diabetes mellitus with foot ulcer E10.52 - Type 1 diabetes mellitus with diabetic peripheral angiopathy with gangrene I70.245 - Atherosclerosis of native arteries of left leg with ulceration of other part of foot I70.261 - Atherosclerosis of native arteries of extremities with gangrene, right leg O96.295 - Pressure ulcer of left heel, stage 2 Z89.511 - Acquired absence of right leg below knee Procedures Wound #9 Wound #9 is a Diabetic Wound/Ulcer of the Lower Extremity located on the Left  Calcaneous . There was a Skin/Subcutaneous Tissue Debridement (28413-24401) debridement with total area of 21.6 sq cm performed by [name] Caul, MD. with the following instrument(s): Curette to remove Non-Viable tissue/material including Eschar and Subcutaneous after achieving pain control using Lidocaine 4% Topical Solution. A time out was conducted prior to the start of the procedure. A Minimum amount of bleeding was controlled with Pressure. The procedure was tolerated well with a pain level of 0 throughout and a pain level of 0 following the procedure. Post Debridement Measurements: 4.5cm length x 4.8cm width x 0.2cm depth; [name] [name], [name] ([id number]) 3.393cm^3 volume. Post procedure Diagnosis Wound #9: Same as Pre-Procedure Plan Wound Cleansing: Wound #12 Left,Plantar,Circumferential Foot: Cleanse wound with mild soap and water Wound #9 Left Calcaneous: Cleanse wound with mild soap and water Primary Wound Dressing: Wound #12 Left,Plantar,Circumferential Foot: Aquacel Ag - to wet areas ****PLEASE ORDER THE 10CM / 4IN X 5 IN SQUARES ****** Other: - betadine STICKS paint to dry areas ****PLEASE ORDER SOME FOR PT***** Wound #9 Left Calcaneous: Santyl Ointment Secondary Dressing: Wound #12 Left,Plantar,Circumferential Foot: ABD pad Gauze and Kerlix/Conform Wound #9 Left Calcaneous: ABD pad Gauze and Kerlix/Conform Dressing Change Frequency: Wound #12 Left,Plantar,Circumferential Foot: Change dressing every day. Wound #9 Left Calcaneous: Change dressing every day. Follow-up Appointments: Wound #12 Left,Plantar,Circumferential Foot: Other: - as patient is able Wound #9 Left Calcaneous: Other: - as patient is able Off-Loading: Wound #12 Left,Plantar,Circumferential Foot: Open toe surgical shoe with peg assist. Wound #9 Left Calcaneous: Open toe surgical shoe with peg assist. [name] [name], [name] ([id number]) continue santyl, kerlix,conform patient had an extra  dialysis today, and feels exhausted Electronic Signature(s) Signed: 01/29/2016 8:00:31 AM By: [name] Najjar MD Entered By: [name] [name] on 01/28/2016 14:47:41 [name] [name] ([id number]) -------------------------------------------------------------------------------- SuperBill Details [name] [name] Date of Service: 01/28/2016 Patient Name: N. Patient Account Number: [id number] Medical Record Treating RN: [name] [name] [id number] Number: Other Clinician: 08-Dec-1980 (34 y.o. 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 Pressure ulcer of left heel, stage 2 Z89.511 Acquired absence of right leg below knee Facility Procedures CPT4: Description Modifier Quantity Code [id number] 11042 - DEB SUBQ TISSUE 20 SQ CM/< 1 ICD-10 Description Diagnosis I70.245 Atherosclerosis of native arteries of left leg with ulceration of other part of foot CPT4: [id number] 11045 - DEB SUBQ TISS EA ADDL 20CM 1 ICD-10 Description Diagnosis I70.245 Atherosclerosis of native arteries of left leg with ulceration of other part of foot Physician Procedures CPT4: Description Modifier Quantity Code [id number] 11042 - WC PHYS SUBQ TISS 20 SQ CM 1 ICD-10 Description Diagnosis I70.245 Atherosclerosis of native arteries of left leg with ulceration of other part of foot [name] [name], [name] [name] ([id number]) Electronic Signature(s) Signed: 01/29/2016 8:00:31 AM By: [name] Najjar MD Entered By: [name] [name] on 01/28/2016 14:49:10

## 2016-01-29 NOTE — Progress Notes (Signed)
MANAHIL, [name] ([id number]) Visit Report for 01/28/2016 Arrival Information Details [name], [name] Date of Service: 01/28/2016 2:15 PM Patient Name: N. Patient Account Number: [id number] Medical Record Treating RN: [name] [id number] Number: Other Clinician: 1981-05-02 (34 y.o. Treating [name], [name] Date of Birth/Sex: Female) Physician/Extender: G Primary Care [name] Physician: Referring Physician: [name] in Treatment: 21 Visit Information History Since Last Visit Added or deleted any medications: No Patient Arrived: Wheel Chair Any new allergies or adverse reactions: No Arrival Time: 14:18 Had a fall or experienced change in No activities of daily living that may affect Accompanied By: mom risk of falls: Transfer Assistance: Manual Signs or symptoms of abuse/neglect since last No Patient Identification Verified: Yes visito Secondary Verification Process Yes Hospitalized since last visit: No Completed: Pain Present Now: No Patient Requires Transmission-Based No Precautions: Patient Has Alerts: Yes Electronic Signature(s) Signed: 01/28/2016 4:42:26 PM By: [name] Entered By: [name] on 01/28/2016 14:20:23 [name] ([id number]) -------------------------------------------------------------------------------- Encounter Discharge Information Details [name] Date of Service: 01/28/2016 2:15 PM Patient Name: N. Patient Account Number: [id number] Medical Record Treating RN: [name] [id number] Number: Other Clinician: [birth year] (34 y.o. Treating [name], [name] Date of Birth/Sex: Female) Physician/Extender: G Primary Care [name] Physician: Referring Physician: [name] in Treatment: 21 Encounter Discharge Information Items Discharge Pain Level: 0 Discharge Condition: Stable Ambulatory Status: Wheelchair Discharge Destination: Home Transportation: Private Auto Accompanied  By: mom Schedule Follow-up Appointment: Yes Medication Reconciliation completed No and provided to Patient/Care [name]: Patient Clinical Summary of Care: Declined Electronic Signature(s) Signed: 01/28/2016 3:37:47 PM By: [name] Previous Signature: 01/28/2016 3:00:08 PM Version By: Gwenlyn Perking Entered By: [name] on 01/28/2016 15:37:47 [name] ([id number]) -------------------------------------------------------------------------------- Multi Wound Chart Details [name] Date of Service: 01/28/2016 2:15 PM Patient Name: N. Patient Account Number: [id number] Medical Record Treating RN: [name] [id number] Number: Other Clinician: 1980-10-30 (34 y.o. Treating [name], [name] Date of Birth/Sex: Female) Physician/Extender: G Primary Care [name] Physician: Referring Physician: [name] in Treatment: 21 Vital Signs Height(in): 68 Pulse(bpm): 76 Weight(lbs): 96 Blood Pressure 88/68 (mmHg): Body Mass Index(BMI): 15 Temperature(F): Respiratory Rate 16 (breaths/min): Photos: [12:No Photos] [9:No Photos] [N/A:N/A] Wound Location: [12:Left Foot - Plantar, Circumfernential] [9:Left Calcaneous] [N/A:N/A] Wounding Event: [12:Gradually Appeared] [9:Pressure Injury] [N/A:N/A] Primary Etiology: [12:Diabetic Wound/Ulcer of Diabetic Wound/Ulcer of N/A the Lower Extremity] [9:the Lower Extremity] Comorbid History: [12:Anemia, Type I Diabetes, Anemia, Type I Diabetes, N/A End Stage Renal Disease, End Stage Renal Disease, Rheumatoid Arthritis, Neuropathy] [9:Rheumatoid Arthritis, Neuropathy] Date Acquired: [12:07/28/2015] [9:07/28/2015] [N/A:N/A] Weeks of Treatment: [12:21] [9:21] [N/A:N/A] Wound Status: [12:Open] [9:Open] [N/A:N/A] Pending Amputation on Yes [9:No] [N/A:N/A] Presentation: Measurements L x W x D 8.6x6.6x0.2 [9:4.5x4.8x0.1] [N/A:N/A] (cm) Area (cm) : [12:44.579] [9:16.965] [N/A:N/A] Volume (cm) : [12:8.916]  [9:1.696] [N/A:N/A] % Reduction in Area: [12:-3.20%] [9:8.70%] [N/A:N/A] % Reduction in Volume: -106.40% [9:8.70%] [N/A:N/A] Classification: [12:Grade 2] [9:Grade 2] [N/A:N/A] Exudate Amount: [12:Medium] [9:Small] [N/A:N/A] Exudate Type: [12:Serous] [9:Serous] [N/A:N/A] Exudate Color: [12:amber Yes] [9:amber Yes] [N/A:N/A N/A] Foul Odor After Cleansing: Odor Anticipated Due to No No N/A Product Use: Wound Margin: Indistinct, nonvisible Distinct, outline attached N/A Granulation Amount: Small (1-33%) Small (1-33%) N/A Granulation Quality: Red Pink N/A Necrotic Amount: Large (67-100%) Large (67-100%) N/A Necrotic Tissue: Eschar, Adherent Slough Eschar, Adherent Slough N/A Exposed Structures: Fascia: No Fascia: No N/A Fat: No Fat: No Tendon: No Tendon: No Muscle: No Muscle: No Joint: No Joint: No Bone: No Bone: No Limited to Skin Limited to Skin Breakdown  Breakdown Epithelialization: None None N/A Periwound Skin Texture: Edema: Yes Edema: No N/A Excoriation: No Excoriation: No Induration: No Induration: No Callus: No Callus: No Crepitus: No Crepitus: No Fluctuance: No Fluctuance: No Friable: No Friable: No Rash: No Rash: No Scarring: No Scarring: No Periwound Skin Moist: Yes Moist: Yes N/A Moisture: Maceration: No Maceration: No Dry/Scaly: No Dry/Scaly: No Periwound Skin Color: Atrophie Blanche: No Atrophie Blanche: No N/A Cyanosis: No Cyanosis: No Ecchymosis: No Ecchymosis: No Erythema: No Erythema: No Hemosiderin Staining: No Hemosiderin Staining: No Mottled: No Mottled: No Pallor: No Pallor: No Rubor: No Rubor: No Temperature: No Abnormality No Abnormality N/A Tenderness on No Yes N/A Palpation: Wound Preparation: Ulcer Cleansing: Ulcer Cleansing: N/A Rinsed/Irrigated with Rinsed/Irrigated with Saline Saline Topical Anesthetic Topical Anesthetic Applied: None Applied: Other: lidocaine 4% Treatment Notes [name], [name]  ([id number]) Electronic Signature(s) Signed: 01/28/2016 4:42:26 PM By: [name] Entered By: [name] on 01/28/2016 14:35:59 [name] ([id number]) -------------------------------------------------------------------------------- Multi-Disciplinary Care Plan Details [name] Date of Service: 01/28/2016 2:15 PM Patient Name: N. Patient Account Number: [id number] Medical Record Treating RN: [name] [id number] Number: Other Clinician: 1981-07-23 (34 y.o. Treating [name], [name] Date of Birth/Sex: Female) Physician/Extender: G Primary Care [name] Physician: Referring Physician: [name] in Treatment: 21 Active Inactive HBO Nursing Diagnoses: Anxiety related to feelings of confinement associated with the hyperbaric oxygen chamber Anxiety related to knowledge deficit of hyperbaric oxygen therapy and treatment procedures Discomfort related to temperature and humidity changes inside hyperbaric chamber Potential for barotraumas to ears, sinuses, teeth, and lungs or cerebral gas embolism related to changes in atmospheric pressure inside hyperbaric oxygen chamber Potential for oxygen toxicity seizures related to delivery of 100% oxygen at an increased atmospheric pressure Potential for pulmonary oxygen toxicity related to delivery of 100% oxygen at an increased atmospheric pressure Goals: Barotrauma will be prevented during HBO2 Date Initiated: 08/30/2015 Goal Status: Active Patient and/or family will be able to state/discuss factors appropriate to the management of their disease process during treatment Date Initiated: 08/30/2015 Goal Status: Active Patient will tolerate the hyperbaric oxygen therapy treatment Date Initiated: 08/30/2015 Goal Status: Active Patient will tolerate the internal climate of the chamber Date Initiated: 08/30/2015 Goal Status: Active Patient/caregiver will verbalize understanding of HBO goals, rationale,  procedures and potential hazards Date Initiated: 08/30/2015 Goal Status: Active Signs and symptoms of pulmonary oxygen toxicity will be recognized and promptly addressed Date Initiated: 08/30/2015 [name] ([id number]) Goal Status: Active Signs and symptoms of seizure will be recognized and promptly addressed ; seizing patients will suffer no harm Date Initiated: 08/30/2015 Goal Status: Active Interventions: Administer a five (5) minute air break for patient if signs and symptoms of seizure appear and notify the hyperbaric physician Administer a ten (10) minute air break for patient if signs and symptoms of seizure appear and notify the hyperbaric physician Administer decongestants, per physician orders, prior to HBO2 Administer the correct therapeutic gas delivery based on the patients needs and limitations, per physician order Assess and provide for patientos comfort related to the hyperbaric environment and equalization of middle ear Assess for signs and symptoms related to adverse events, including but not limited to confinement anxiety, pneumothorax, oxygen toxicity and baurotrauma Assess patient for any history of confinement anxiety Assess patient's knowledge and expectations regarding hyperbaric medicine and provide education related to the hyperbaric environment, goals of treatment and prevention of adverse events Implement protocols to decrease risk of pneumothorax in high risk patients Notes: Abuse / Safety / Falls / Self  Care Management Nursing Diagnoses: Potential for falls Goals: Patient will remain injury free Date Initiated: 09/19/2015 Goal Status: Active Patient/caregiver will verbalize/demonstrate measures taken to prevent injury and/or falls Date Initiated: 09/19/2015 Goal Status: Active Interventions: Assess fall risk on admission and as needed Assess impairment of mobility on admission and as needed per policy Notes: Orientation to the Wound  Care Program Nursing Diagnoses: [name], [name] ([id number]) Knowledge deficit related to the wound healing center program Goals: Patient/caregiver will verbalize understanding of the Wound Healing Center Program Date Initiated: 08/30/2015 Goal Status: Active Interventions: Provide education on orientation to the wound center Notes: Pressure Nursing Diagnoses: Knowledge deficit related to causes and risk factors for pressure ulcer development Knowledge deficit related to management of pressures ulcers Potential for impaired tissue integrity related to pressure, friction, moisture, and shear Goals: Patient will remain free from development of additional pressure ulcers Date Initiated: 08/30/2015 Goal Status: Active Patient will remain free of pressure ulcers Date Initiated: 08/30/2015 Goal Status: Active Patient/caregiver will verbalize risk factors for pressure ulcer development Date Initiated: 08/30/2015 Goal Status: Active Patient/caregiver will verbalize understanding of pressure ulcer management Date Initiated: 08/30/2015 Goal Status: Active Interventions: Assess: immobility, friction, shearing, incontinence upon admission and as needed Assess offloading mechanisms upon admission and as needed Assess potential for pressure ulcer upon admission and as needed Provide education on pressure ulcers Treatment Activities: Patient referred for home evaluation of offloading devices/mattresses : 01/15/2016 Patient referred for pressure reduction/relief devices : 01/15/2016 Pressure reduction/relief device ordered : 01/15/2016 Notes: [name], [name] ([id number]) Wound/Skin Impairment Nursing Diagnoses: Impaired tissue integrity Knowledge deficit related to ulceration/compromised skin integrity Goals: Patient will have a decrease in wound volume by X% from date: (specify in notes) Date Initiated: 08/30/2015 Goal Status: Active Patient/caregiver will verbalize understanding of  skin care regimen Date Initiated: 08/30/2015 Goal Status: Active Ulcer/skin breakdown will have a volume reduction of 30% by week 4 Date Initiated: 08/30/2015 Goal Status: Active Ulcer/skin breakdown will have a volume reduction of 50% by week 8 Date Initiated: 08/30/2015 Goal Status: Active Ulcer/skin breakdown will have a volume reduction of 80% by week 12 Date Initiated: 08/30/2015 Goal Status: Active Ulcer/skin breakdown will heal within 14 weeks Date Initiated: 08/30/2015 Goal Status: Active Interventions: Assess patient/caregiver ability to obtain necessary supplies Assess patient/caregiver ability to perform ulcer/skin care regimen upon admission and as needed Assess ulceration(s) every visit Provide education on ulcer and skin care Notes: Electronic Signature(s) Signed: 01/28/2016 4:42:26 PM By: [name] Entered By: [name] on 01/28/2016 14:30:21 [name] ([id number]) -------------------------------------------------------------------------------- Patient/Caregiver Education Details [name] Date of Service: 01/28/2016 2:15 PM Patient Name: N. Patient Account Number: [id number] Medical Record Treating RN: [name] [id number] Number: Other Clinician: [birth year] (34 y.o. Treating [name], [name] Date of Birth/Gender: Female) Physician/Extender: G Primary Care Weeks in Treatment: 79 [name] Physician: Referring Physician: [name] Education Assessment Education Provided To: Patient Education Topics Provided Wound/Skin Impairment: Handouts: Other: wound care as ordered Methods: Demonstration, Explain/Verbal Responses: State content correctly Electronic Signature(s) Signed: 01/28/2016 3:38:10 PM By: [name] Entered By: [name] on 01/28/2016 15:38:10 [name] ([id number]) -------------------------------------------------------------------------------- Wound Assessment Details [name]  Date of Service: 01/28/2016 2:15 PM Patient Name: N. Patient Account Number: [id number] Medical Record Treating RN: [name] [id number] Number: Other Clinician: 27-Jun-1981 (34 y.o. Treating [name], [name] Date of Birth/Sex: Female) Physician/Extender: G Primary Care [name] Physician: Referring Physician: [name] in Treatment: 21 Wound Status Wound Number: 12 Primary Diabetic Wound/Ulcer of the Lower Etiology:  Extremity Wound Location: Left Foot - Plantar, Circumfernential Wound Open Status: Wounding Event: Gradually Appeared Comorbid Anemia, Type I Diabetes, End Stage Date Acquired: 07/28/2015 History: Renal Disease, Rheumatoid Arthritis, Weeks Of Treatment: 21 Neuropathy Clustered Wound: No Pending Amputation On Presentation Wound Measurements Length: (cm) 8.6 Width: (cm) 6.6 Depth: (cm) 0.2 Area: (cm) 44.579 Volume: (cm) 8.916 % Reduction in Area: -3.2% % Reduction in Volume: -106.4% Epithelialization: None Tunneling: No Undermining: No Wound Description Classification: Grade 2 Foul Odor After Wound Margin: Indistinct, nonvisible Due to Product U Exudate Amount: Medium Exudate Type: Serous Exudate Color: amber Cleansing: Yes se: No Wound Bed Granulation Amount: Small (1-33%) Exposed Structure Granulation Quality: Red Fascia Exposed: No Necrotic Amount: Large (67-100%) Fat Layer Exposed: No Necrotic Quality: Eschar, Adherent Slough Tendon Exposed: No Muscle Exposed: No Joint Exposed: No Bone Exposed: No Limited to Skin Breakdown Periwound Skin Texture [name], [name] ([id number]) Texture Color No Abnormalities Noted: No No Abnormalities Noted: No Callus: No Atrophie Blanche: No Crepitus: No Cyanosis: No Excoriation: No Ecchymosis: No Fluctuance: No Erythema: No Friable: No Hemosiderin Staining: No Induration: No Mottled: No Localized Edema: Yes Pallor: No Rash: No Rubor: No Scarring: No Temperature /  Pain Moisture Temperature: No Abnormality No Abnormalities Noted: No Dry / Scaly: No Maceration: No Moist: Yes Wound Preparation Ulcer Cleansing: Rinsed/Irrigated with Saline Topical Anesthetic Applied: None Treatment Notes Wound #12 (Left, Plantar, Circumferential Foot) 1. Cleansed with: Clean wound with Normal Saline 4. Dressing Applied: Aquacel Ag Other dressing (specify in notes) 5. Secondary Dressing Applied Guaze, ABD and kerlix/Conform 7. Secured with Tape Notes betadine paint and stretch netting Electronic Signature(s) Signed: 01/28/2016 4:42:26 PM By: [name] Entered By: [name] on 01/28/2016 14:29:01 [name] ([id number]) -------------------------------------------------------------------------------- Wound Assessment Details [name] Date of Service: 01/28/2016 2:15 PM Patient Name: N. Patient Account Number: [id number] Medical Record Treating RN: [name] [id number] Number: Other Clinician: Feb 08, 1981 (34 y.o. Treating [name], [name] Date of Birth/Sex: Female) Physician/Extender: G Primary Care [name] Physician: Referring Physician: [name] in Treatment: 21 Wound Status Wound Number: 9 Primary Diabetic Wound/Ulcer of the Lower Etiology: Extremity Wound Location: Left Calcaneous Wound Open Wounding Event: Pressure Injury Status: Date Acquired: 07/28/2015 Comorbid Anemia, Type I Diabetes, End Stage Weeks Of Treatment: 21 History: Renal Disease, Rheumatoid Arthritis, Clustered Wound: No Neuropathy Wound Measurements Length: (cm) 4.5 Width: (cm) 4.8 Depth: (cm) 0.1 Area: (cm) 16.965 Volume: (cm) 1.696 % Reduction in Area: 8.7% % Reduction in Volume: 8.7% Epithelialization: None Tunneling: No Undermining: No Wound Description Classification: Grade 2 Wound Margin: Distinct, outline attached Exudate Amount: Small Exudate Type: Serous Exudate Color: amber Foul Odor After Cleansing:  Yes Due to Product Use: No Wound Bed Granulation Amount: Small (1-33%) Exposed Structure Granulation Quality: Pink Fascia Exposed: No Necrotic Amount: Large (67-100%) Fat Layer Exposed: No Necrotic Quality: Eschar, Adherent Slough Tendon Exposed: No Muscle Exposed: No Joint Exposed: No Bone Exposed: No Limited to Skin Breakdown Periwound Skin Texture Texture Color No Abnormalities Noted: No No Abnormalities Noted: No [name], [name] ([id number]) Callus: No Atrophie Blanche: No Crepitus: No Cyanosis: No Excoriation: No Ecchymosis: No Fluctuance: No Erythema: No Friable: No Hemosiderin Staining: No Induration: No Mottled: No Localized Edema: No Pallor: No Rash: No Rubor: No Scarring: No Temperature / Pain Moisture Temperature: No Abnormality No Abnormalities Noted: No Tenderness on Palpation: Yes Dry / Scaly: No Maceration: No Moist: Yes Wound Preparation Ulcer Cleansing: Rinsed/Irrigated with Saline Topical Anesthetic Applied: Other: lidocaine 4%, Treatment Notes Wound #9 (Left Calcaneous) 1.  Cleansed with: Clean wound with Normal Saline 2. Anesthetic Topical Lidocaine 4% cream to wound bed prior to debridement 4. Dressing Applied: Santyl Ointment 5. Secondary Dressing Applied Guaze, ABD and kerlix/Conform 7. Secured with Tape Notes stretch netting Electronic Signature(s) Signed: 01/28/2016 4:42:26 PM By: [name] Entered By: [name] on 01/28/2016 14:30:07 [name] ([id number]) -------------------------------------------------------------------------------- Vitals Details [name] Date of Service: 01/28/2016 2:15 PM Patient Name: N. Patient Account Number: [id number] Medical Record Treating RN: [name] [id number] Number: Other Clinician: Jul 25, 1981 (34 y.o. Treating [name], [name] Date of Birth/Sex: Female) Physician/Extender: G Primary Care [name] Physician: Referring Physician: [name] in Treatment: 21 Vital Signs Time Taken: 14:21 Pulse (bpm): 76 Height (in): 68 Respiratory Rate (breaths/min): 16 Weight (lbs): 96 Blood Pressure (mmHg): 88/68 Body Mass Index (BMI): 14.6 Reference Range: 80 - 120 mg / dl Electronic Signature(s) Signed: 01/28/2016 4:42:26 PM By: [name] Entered By: [name] on 01/28/2016 14:22:01

## 2016-02-04 ENCOUNTER — Ambulatory Visit: Payer: Medicare Other | Admitting: Internal Medicine

## 2016-02-05 ENCOUNTER — Encounter: Payer: Medicare Other | Admitting: Internal Medicine

## 2016-02-05 DIAGNOSIS — E10621 Type 1 diabetes mellitus with foot ulcer: Secondary | ICD-10-CM | POA: Diagnosis not present

## 2016-02-06 NOTE — Progress Notes (Signed)
EDLIN, FORD ([id number]) Visit Report for 02/05/2016 Chief Complaint Document Details BITANIA, SHANKLAND Date of Service: 02/05/2016 3:45 PM Patient Name: N. Patient Account Number: [id number] Medical Record Treating RN: Huel Coventry [id number] Number: Other Clinician: Feb 25, 1981 (35 y.o. Treating Mozetta Murfin Date of Birth/Sex: Female) Physician/Extender: G Primary Care [name] Physician: Referring Physician: [name] in Treatment: 22 Information Obtained from: Patient Chief Complaint Patient in today for treatment of non-healing wound and HBO Treatment. she has just gotten out of hospital this week and is back to resume her hyperbaric oxygen therapy Electronic Signature(s) Signed: 02/05/2016 5:31:12 PM By: Baltazar Najjar MD Entered By: Baltazar Najjar on 02/05/2016 16:42:50 [name] ([id number]) -------------------------------------------------------------------------------- Debridement Details [name] Date of Service: 02/05/2016 3:45 PM Patient Name: N. Patient Account Number: [id number] Medical Record Treating RN: Huel Coventry [id number] Number: Other Clinician: 12-Jan-1981 (35 y.o. Treating Kamera Dubas Date of Birth/Sex: Female) Physician/Extender: G Primary Care [name] Physician: Referring Physician: [name] in Treatment: 22 Debridement Performed for Wound #9 Left Calcaneous Assessment: Performed By: Physician [name], MD Debridement: Open Wound/Selective Debridement Selective Description: Pre-procedure Yes Verification/Time Out Taken: Start Time: 16:34 Pain Control: Lidocaine 4% Topical Solution Level: Non-Viable Tissue Total Area Debrided (L x 4 (cm) x 5 (cm) = 20 (cm) W): Tissue and other Eschar material debrided: Instrument: Blade Bleeding: Minimum Hemostasis Achieved: Pressure End Time: 16:38 Procedural Pain: 0 Post Procedural Pain: 0 Response to Treatment: Procedure  was tolerated well Post Debridement Measurements of Total Wound Length: (cm) 4 Width: (cm) 5 Depth: (cm) 1 Volume: (cm) 15.708 Post Procedure Diagnosis Same as Pre-procedure Electronic Signature(s) Signed: 02/05/2016 4:50:28 PM By: Elliot Gurney, RN, [name], Kim RN, [name] [place], Ledell Peoples ([id number]) Signed: 02/05/2016 5:31:12 PM By: Baltazar Najjar MD Entered By: Baltazar Najjar on 02/05/2016 16:42:38 [name] ([id number]) -------------------------------------------------------------------------------- HPI Details [name] Date of Service: 02/05/2016 3:45 PM Patient Name: N. Patient Account Number: [id number] Medical Record Treating RN: Huel Coventry [id number] Number: Other Clinician: 1980/11/29 (35 y.o. Treating Phoenix Riesen Date of Birth/Sex: Female) Physician/Extender: G Primary Care [name] Physician: Referring Physician: [name] in Treatment: 22 History of Present Illness Location: dry gangrene both feet and heels Quality: Patient reports No Pain. Severity: Patient states wound are getting worse. Duration: Patient has had the wound for > 4months prior to seeking treatment at the wound center Context: The wound appeared gradually over time Modifying Factors: she has been in and out of hospital over the last 2 months Associated Signs and Symptoms: Patient reports having difficulty standing for long periods. HPI Description: Anamaria Dusenbury is a 35 y.o. female who presents to our wound center, back in June 2016, referred by her PCP Dr. Zada Finders for nonhealing ulcers on the lateral aspect of the right heel. Of note she has a history of type 1 diabetes mellitus that has been uncontrolled. Past medical history significant for type 1 diabetes mellitus not controlled, ankylosing spondylitis, anorexia nervosa, irritable bowel syndrome, chronic kidney disease, chronic diarrhea. she then developed gangrene of both feet due to severe peripheral  vascular disease and also had gangrene of the tips of her fingers due to upper extremity vascular disease. She was being worked up by vascular surgery at [facility] and at [facility] and has had several procedures done there. She started with hyperbaric oxygen therapy and had a total of 40 treatments the last one being on 06/20/2015. After the initial treatment of hyperbaric oxygen therapy she started having ear problems and had ultimately  to use myringotomy tubes and this was done bilaterally. Since then her ears have been doing fine. In late September, she had seen vascular and hand surgeons. since then she's been in [place] at the rec center for surgery involving extensive vascular procedures for the upper extremities. She was then at [facility] with GI bleeds both upper and lower and has been in and out of hospital for that. She has recently been out of hospital for the last week. 09/09/2015 -- she was unable to get here in time to start her hyperbaric oxygen today and hence is only here for a wound care visit. 09/19/2015 -- she has been having vancomycin during her dialysis and continues to have vascular appointments and the procedure is been set for early January. She has been unable to make it for her hemodialysis due to various medical symptoms. 09/30/2014 -- her vancomycin was stopped on 09/25/2015 and the mother has noticed the right foot has started draining for the last 3 days. Addendum: after examining the patient I was able to talk to her primary vascular surgeon Dr. Pernell Dupre at the [facility]. I told him about the necrotic area on the plantar aspect of right foot which is now wet gangrene and he agreed with me that he would admit her at [facility] under her care and synchronize further treatment. We have discussed her poor prognosis and he and I discussed the need for hospice care [facility], SINDEE LICHTENBERG. ([id number]) and for sitting down and talking to the patient and her mother and giving  them a proper detail of the prognosis. 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 She was worked up with MRIs, abdomen aortogram and placement of left-sided angioplasties were done. After a prolonged hospital she she was discharged home and was told to wear shrinker sock and stump protector and see her surgeon for further instructions regarding wound care and suture removal. Asked to take long-term doxycycline. 01/15/16; this is a patient I haven't seen before although she is been followed by Dr. Meyer Russel in this clinic today. She is a type I diabetic with severe PAD macrovascular disease. She has had a previous BKA. She has dry gangrene of the tips of her fingers which she showed me on the right to. She also has dry gangrene of the left first second and third toes and a portion of her proximal foot around these areas. She is followed by vascular surgery at [facility] and saw them recently they are not going to do surgery as of yet. She has a large black eschar over her heel which is beginning to separate in some areas. As I understand think she is paining these with Betadine. There is been some suggestion about retrying hyperbarics on her although she is still not able to commit to the frequency of treatment that would be necessary to see improvement. She is also on Monday Wednesday Friday dialysis 01/21/16; the patient returns to see me today with regards to the left heel. Apparently she is not scheduled for any further attempts at revascularization of the left foot. She has dry gangrene of the left medial foot,  first and second  toes and there is already some separation developing here. 01/28/16; the patient returns today for attention to the left foot specifically the left calcaneal ulcer. This is covered in a thick black eschar. I crosshatched this last week and we have been using Santyl. 02/05/16; I continue to work on the thick black eschar on the patient's left calcaneus. She is using Santyl that this side crosshatched this area and the eschar is beginning to loosen. 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 This is beginning to separate and liquefy as well especially between the first and second toes. In the time being her major complaint is fatigue at dialysis Electronic Signature(s) Signed: 02/05/2016 5:31:12 PM By: Baltazar Najjar MD Entered By: Baltazar Najjar on 02/05/2016 16:44:13 [name] ([id number]) -------------------------------------------------------------------------------- Physical Exam Details [name] Date of Service: 02/05/2016 3:45 PM Patient Name: N. Patient Account Number: [id number] Medical Record Treating RN: Huel Coventry [id number] Number: Other Clinician: [birth year] (34 y.o. Treating Aarit Kashuba Date of Birth/Sex: Female) Physician/Extender: G Primary Care [name] Physician: Referring Physician: [name] in Treatment: 22 Notes Wound exam; I continue to work on the left calcaneus. The eschar is separating there is what appears to be reasonably healthy-looking tissue under the needs this which I hope to be able to work with. There is separation between the gangrenous first and second and second and third toes. This is being followed by the limb salvage service at [facility] Electronic Signature(s) Signed: 02/05/2016 5:31:12 PM By: Baltazar Najjar MD Entered By: Baltazar Najjar on 02/05/2016  16:45:26 [name] ([id number]) -------------------------------------------------------------------------------- Physician Orders Details [name] Date of Service: 02/05/2016 3:45 PM Patient Name: N. Patient Account Number: [id number] Medical Record Treating RN: Clover Mealy RN, [name], [place] S[place] [id number] Number: Other Clinician: 1980/10/05 (34 y.o. Treating Delbra Zellars Date of Birth/Sex: Female) Physician/Extender: G Primary Care [name] Physician: Referring Physician: [name] in Treatment: 21 Verbal / Phone Orders: Yes Clinician: [name], RN, [name], [name] Read Back and Verified: Yes Diagnosis Coding Wound Cleansing Wound #12 Left,Plantar,Circumferential Foot o Cleanse wound with mild soap and water Wound #9 Left Calcaneous o Cleanse wound with mild soap and water Primary Wound Dressing Wound #12 Left,Plantar,Circumferential Foot o Aquacel Ag - to wet areas ****PLEASE ORDER THE 10CM / 4IN X 5 IN SQUARES ****** o Other: - betadine STICKS paint to dry areas ****PLEASE ORDER SOME FOR PT***** Wound #9 Left Calcaneous o Santyl Ointment Secondary Dressing Wound #12 Left,Plantar,Circumferential Foot o ABD pad o Gauze and Kerlix/Conform Wound #9 Left Calcaneous o ABD pad o Gauze and Kerlix/Conform Dressing Change Frequency Wound #12 Left,Plantar,Circumferential Foot o Change dressing every day. Wound #9 Left Calcaneous o Change dressing every day. YEHUDIS, MONCEAUX ([id number]) Follow-up Appointments Wound #12 Left,Plantar,Circumferential Foot o Other: - as patient is able Wound #9 Left Calcaneous o Other: - as patient is able Off-Loading Wound #12 Left,Plantar,Circumferential Foot o Open toe surgical shoe with peg assist. Wound #9 Left Calcaneous o Open toe surgical shoe with peg assist. Electronic Signature(s) Signed: 02/05/2016 5:24:50 PM By: Elpidio Eric [name], RN Signed: 02/05/2016 5:31:12 PM By: Baltazar Najjar MD Entered By: Elpidio Eric on 02/05/2016 16:41:18 [name] ([id number]) -------------------------------------------------------------------------------- Problem List Details [name] Date of Service: 02/05/2016 3:45 PM Patient Name: N. Patient Account Number: [id number] Medical Record Treating RN: Huel Coventry [id number] Number: Other Clinician: November 05, 1980 (34 y.o. Treating  Baltazar Najjar Date of Birth/Sex: Female) Physician/Extender: G Primary Care [name] Physician: Referring Physician: [name] in Treatment: 22 Active Problems ICD-10 Encounter Code Description Active Date Diagnosis E10.621 Type 1 diabetes mellitus with foot ulcer 08/30/2015 Yes E10.52 Type 1 diabetes mellitus with diabetic peripheral 08/30/2015 Yes angiopathy with gangrene I70.245 Atherosclerosis of native arteries of left leg with ulceration 08/30/2015 Yes of other part of foot I70.261 Atherosclerosis of native arteries of extremities with 08/30/2015 Yes gangrene, right leg L89.622 Pressure ulcer of left heel, stage 2 08/30/2015 Yes Z89.511 Acquired absence of right leg below knee 11/29/2015 Yes Inactive Problems Resolved Problems ICD-10 Code Description Active Date Resolved Date L02.611 Cutaneous abscess of right foot 10/01/2015 10/01/2015 RAINI, SHENBERGER ([id number]) Electronic Signature(s) Signed: 02/05/2016 5:31:12 PM By: Baltazar Najjar MD Entered By: Baltazar Najjar on 02/05/2016 16:42:19 [name] ([id number]) -------------------------------------------------------------------------------- Progress Note Details [name] Date of Service: 02/05/2016 3:45 PM Patient Name: N. Patient Account Number: [id number] Medical Record Treating RN: Huel Coventry [id number] Number: Other Clinician: October 04, 1980 (34 y.o. Treating Erine Phenix Date of Birth/Sex: Female) Physician/Extender: G Primary Care [name] Physician: Referring  Physician: [name] in Treatment: 22 Subjective Chief Complaint Information obtained from Patient Patient in today for treatment of non-healing wound and HBO Treatment. she has just gotten out of hospital this week and is back to resume her hyperbaric oxygen therapy History of Present Illness (HPI) The following HPI elements were documented for the patient's wound: Location: dry gangrene both feet and heels Quality: Patient reports No Pain. Severity: Patient states wound are getting worse. Duration: Patient has had the wound for > 68months prior to seeking treatment at the wound center Context: The wound appeared gradually over time Modifying Factors: she has been in and out of hospital over the last 2 months Associated Signs and Symptoms: Patient reports having difficulty standing for long periods. Datra Schrauben is a 35 y.o. female who presents to our wound center, back in June 2016, referred by her PCP Dr. Zada Finders for nonhealing ulcers on the lateral aspect of the right heel. Of note she has a history of type 1 diabetes mellitus that has been uncontrolled. Past medical history significant for type 1 diabetes mellitus not controlled, ankylosing spondylitis, anorexia nervosa, irritable bowel syndrome, chronic kidney disease, chronic diarrhea. she then developed gangrene of both feet due to severe peripheral vascular disease and also had gangrene of the tips of her fingers due to upper extremity vascular disease. She was being worked up by vascular surgery at [facility] and at [facility] and has had several procedures done there. She started with hyperbaric oxygen therapy and had a total of 40 treatments the last one being on 06/20/2015. After the initial treatment of hyperbaric oxygen therapy she started having ear problems and had ultimately to use myringotomy tubes and this was done bilaterally. Since then her ears have been doing fine. In late September, she had seen vascular and  hand surgeons. since then she's been in [place] at the rec center for surgery involving extensive vascular procedures for the upper extremities. She was then at [facility] with GI bleeds both upper and lower and has been in and out of hospital for that. She has recently been out of hospital for the last week. 09/09/2015 -- she was unable to get here in time to start her hyperbaric oxygen today and hence is only here for a wound care visit. 09/19/2015 -- she has been having vancomycin during her dialysis and continues to  have vascular Nachreiner, Jamie N. ([id number]) appointments and the procedure is been set for early January. She has been unable to make it for her hemodialysis due to various medical symptoms. 09/30/2014 -- her vancomycin was stopped on 09/25/2015 and the mother has noticed the right foot has started draining for the last 3 days. Addendum: after examining the patient I was able to talk to her primary vascular surgeon Dr. Pernell Dupre at the [facility]. I told him about the necrotic area on the plantar aspect of right foot which is now wet gangrene and he agreed with me that he would admit her at [facility] under her care and synchronize further treatment. We have discussed her poor prognosis and he and I discussed the need for hospice care and for sitting down and talking to the patient and her mother and giving them a proper detail of the prognosis. 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 She was worked up with MRIs, abdomen aortogram and placement of left-sided angioplasties were  done. After a prolonged hospital she she was discharged home and was told to wear shrinker sock and stump protector and see her surgeon for further instructions regarding wound care and suture removal. Asked to take long-term doxycycline. 01/15/16; this is a patient I haven't seen before although she is been followed by Dr. Meyer Russel in this clinic today. She is a type I diabetic with severe PAD macrovascular disease. She has had a previous BKA. She has dry gangrene of the tips of her fingers which she showed me on the right to. She also has dry gangrene of the left first second and third toes and a portion of her proximal foot around these areas. She is followed by vascular surgery at [facility] and saw them recently they are not going to do surgery as of yet. She has a large black eschar over her heel which is beginning to separate in some areas. As I understand think she is paining these with Betadine. There is been some suggestion about retrying hyperbarics on her although she is still not able to commit to the frequency of treatment that would be necessary to see improvement. She is also on Monday Wednesday Friday dialysis 01/21/16; the patient returns to see me today with regards to the left heel. Apparently she is not scheduled for any further attempts at revascularization of the left foot. 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 01/28/16; the patient returns today for attention to the left foot specifically the left calcaneal ulcer. This is covered in a thick black eschar. I crosshatched this last week and we have been using Santyl. 02/05/16; I continue to work on the thick black eschar on the patient's left calcaneus. She is using Santyl that this side crosshatched this area and the eschar is beginning to loosen. She has dry gangrene involving a large area of the medial aspect of her foot extending into the first  metatarsal head and involving the totality of her first and second toes. This is beginning to separate and liquefy as well especially between the first  and second toes. In the time being her major complaint is fatigue at dialysis Objective Constitutional Vitals Time Taken: 4:17 PM, Height: 68 in, Weight: 96 lbs, BMI: 14.6, Temperature: 97.7 F, Pulse: 78 bpm, Respiratory Rate: 16 breaths/min, Blood Pressure: 114/98 mmHg. ITHZEL, FEDORCHAK ([id number]) Integumentary (Hair, Skin) Wound #12 status is Open. Original cause of wound was Gradually Appeared. The wound is located on the Left,Plantar,Circumferential Foot. The wound measures 10cm length x 7cm width x 0.2cm depth; 54.978cm^2 area and 10.996cm^3 volume. The wound is limited to skin breakdown. There is no tunneling or undermining noted. There is a medium amount of serosanguineous drainage noted. The wound margin is indistinct and nonvisible. There is small (1-33%) red granulation within the wound bed. There is a large (67- 100%) amount of necrotic tissue within the wound bed including Eschar and Adherent Slough. The periwound skin appearance exhibited: Localized Edema, Moist. The periwound skin appearance did not exhibit: Callus, Crepitus, Excoriation, Fluctuance, Friable, Induration, Rash, Scarring, Dry/Scaly, Maceration, Atrophie Blanche, Cyanosis, Ecchymosis, Hemosiderin Staining, Mottled, Pallor, Rubor, Erythema. Periwound temperature was noted as No Abnormality. Wound #9 status is Open. Original cause of wound was Pressure Injury. The wound is located on the Left Calcaneous. The wound measures 4cm length x 5cm width x 0.1cm depth; 15.708cm^2 area and 1.571cm^3 volume. The wound is limited to skin breakdown. There is no tunneling or undermining noted. There is a medium amount of serosanguineous drainage noted. The wound margin is distinct with the outline attached to the wound base. There is small (1-33%) pink granulation within the  wound bed. There is a large (67-100%) amount of necrotic tissue within the wound bed including Eschar and Adherent Slough. The periwound skin appearance exhibited: Moist. The periwound skin appearance did not exhibit: Callus, Crepitus, Excoriation, Fluctuance, Friable, Induration, Localized Edema, Rash, Scarring, Dry/Scaly, Maceration, Atrophie Blanche, Cyanosis, Ecchymosis, Hemosiderin Staining, Mottled, Pallor, Rubor, Erythema. Periwound temperature was noted as No Abnormality. The periwound has tenderness on palpation. Assessment Active Problems ICD-10 E10.621 - Type 1 diabetes mellitus with foot ulcer E10.52 - Type 1 diabetes mellitus with diabetic peripheral angiopathy with gangrene I70.245 - Atherosclerosis of native arteries of left leg with ulceration of other part of foot I70.261 - Atherosclerosis of native arteries of extremities with gangrene, right leg F57.322 - Pressure ulcer of left heel, stage 2 Z89.511 - Acquired absence of right leg below knee Procedures Wound #9 Wound #9 is a Diabetic Wound/Ulcer of the Lower Extremity located on the Left Calcaneous . There was a Mcgarvey, Bryce N. ([id number]) Non-Viable Tissue Open Wound/Selective [phone]) debridement with total area of 20 sq cm performed by [name], MD. with the following instrument(s): Blade including Eschar after achieving pain control using Lidocaine 4% Topical Solution. A time out was conducted prior to the start of the procedure. A Minimum amount of bleeding was controlled with Pressure. The procedure was tolerated well with a pain level of 0 throughout and a pain level of 0 following the procedure. Post Debridement Measurements: 4cm length x 5cm width x 1cm depth; 15.708cm^3 volume. Post procedure Diagnosis Wound #9: Same as Pre-Procedure Plan Wound Cleansing: Wound #12 Left,Plantar,Circumferential Foot: Cleanse wound with mild soap and water Wound #9 Left Calcaneous: Cleanse wound with mild  soap and water Primary Wound Dressing: Wound #12 Left,Plantar,Circumferential Foot: Aquacel Ag - to wet areas ****PLEASE ORDER THE 10CM / 4IN X 5 IN SQUARES ****** Other: - betadine STICKS paint to dry areas ****PLEASE ORDER SOME FOR PT***** Wound #9 Left Calcaneous: Santyl Ointment  Secondary Dressing: Wound #12 Left,Plantar,Circumferential Foot: ABD pad Gauze and Kerlix/Conform Wound #9 Left Calcaneous: ABD pad Gauze and Kerlix/Conform Dressing Change Frequency: Wound #12 Left,Plantar,Circumferential Foot: Change dressing every day. Wound #9 Left Calcaneous: Change dressing every day. Follow-up Appointments: Wound #12 Left,Plantar,Circumferential Foot: Other: - as patient is able Wound #9 Left Calcaneous: Other: - as patient is able Off-Loading: Wound #12 Left,Plantar,Circumferential Foot: Open toe surgical shoe with peg assist. Wound #9 Left Calcaneous: Open toe surgical shoe with peg assist. Lubbers, Jazmaine N. ([id number]) Continue santyl to the wounds on the left heel. silver alginate to the wound between the first and second toes Electronic Signature(s) Signed: 02/05/2016 5:31:12 PM By: Baltazar Najjar MD Entered By: Baltazar Najjar on 02/05/2016 16:47:08 [name] ([id number]) -------------------------------------------------------------------------------- SuperBill Details [name] Date of Service: 02/05/2016 Patient Name: N. Patient Account Number: [id number] Medical Record Treating RN: Huel Coventry [id number] Number: Other Clinician: 1981-07-24 (34 y.o. 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, right leg L89.622 Pressure ulcer of left heel, stage 2 Z89.511 Acquired absence of right leg below knee Facility Procedures CPT4 Code: [id number] Description: 97597 - DEBRIDE WOUND 1ST 20 SQ CM OR < ICD-10 Description Diagnosis E10.621 Type 1 diabetes mellitus with foot ulcer Modifier: Quantity: 1 Physician Procedures CPT4 Code: 1660630 Description: 97597 - WC PHYS DEBR WO ANESTH 20 SQ CM ICD-10 Description Diagnosis E10.621 Type 1 diabetes mellitus with foot ulcer Modifier: Quantity: 1 Electronic Signature(s) Signed: 02/05/2016 5:31:12 PM By: Baltazar Najjar MD Entered By: Baltazar Najjar on 02/05/2016 16:47:30

## 2016-02-06 NOTE — Progress Notes (Addendum)
KOTY, LAMOND ([id number]) Visit Report for 02/05/2016 Arrival Information Details [name], Wu Date of Service: 02/05/2016 3:45 PM Patient Name: N. Patient Account Number: [id number] Medical Record Treating RN: Clover Mealy RN, [name], [place] Sink [id number] Number: Other Clinician: 1981/05/19 (35 y.o. Treating [name], Wu Date of Birth/Sex: Female) Physician/Extender: G Primary Care [name] Physician: Referring Physician: [name] in Treatment: 22 Visit Information History Since Last Visit Added or deleted any medications: No Patient Arrived: Wheel Chair Any new allergies or adverse reactions: No Arrival Time: 16:13 Had a fall or experienced change in No activities of daily living that may affect Accompanied By: mom risk of falls: Transfer Assistance: None Signs or symptoms of abuse/neglect since last No Patient Identification Verified: Yes visito Secondary Verification Process Yes Hospitalized since last visit: No Completed: Has Dressing in Place as Prescribed: Yes Patient Requires Transmission-Based No Pain Present Now: No Precautions: Patient Has Alerts: Yes Electronic Signature(s) Signed: 02/05/2016 5:24:50 PM By: Elpidio Eric [name], RN Entered By: Elpidio Eric on 02/05/2016 16:14:05 [name] ([id number]) -------------------------------------------------------------------------------- Encounter Discharge Information Details [name] Date of Service: 02/05/2016 3:45 PM Patient Name: N. Patient Account Number: [id number] Medical Record Treating RN: Clover Mealy RN, [name], [place] Sink [id number] Number: Other Clinician: 09-06-1981 (35 y.o. Treating [name], Wu Date of Birth/Sex: Female) Physician/Extender: G Primary Care [name] Physician: Referring Physician: [name] in Treatment: 22 Encounter Discharge Information Items Discharge Pain Level: 0 Discharge Condition: Stable Ambulatory Status: Wheelchair Discharge  Destination: Home Transportation: Private Auto Accompanied By: mom Schedule Follow-up Appointment: No Medication Reconciliation completed and provided to Patient/Care No Amrit Cress: Provided on Clinical Summary of Care: 02/05/2016 Form Type Recipient Paper Patient HB Electronic Signature(s) Signed: 02/13/2016 12:32:06 PM By: Elpidio Eric [name], RN Previous Signature: 02/05/2016 4:52:40 PM Version By: Gwenlyn Perking Entered By: Elpidio Eric on 02/13/2016 12:32:06 [name] ([id number]) -------------------------------------------------------------------------------- Lower Extremity Assessment Details [name] Date of Service: 02/05/2016 3:45 PM Patient Name: N. Patient Account Number: [id number] Medical Record Treating RN: Clover Mealy RN, [name], [place] Sink [id number] Number: Other Clinician: 1981-07-12 (35 y.o. Treating [name], Wu Date of Birth/Sex: Female) Physician/Extender: G Primary Care [name] Physician: Referring Physician: [name] in Treatment: 22 Vascular Assessment Pulses: Posterior Tibial Dorsalis Pedis Palpable: [Left:No] Doppler: [Left:Monophasic] Extremity colors, hair growth, and conditions: Extremity Color: [Left:Mottled] Hair Growth on Extremity: [Left:No] Temperature of Extremity: [Left:Warm] Capillary Refill: [Left:< 3 seconds] Electronic Signature(s) Signed: 02/05/2016 5:24:50 PM By: Elpidio Eric [name], RN Entered By: Elpidio Eric on 02/05/2016 16:19:02 [name] ([id number]) -------------------------------------------------------------------------------- Multi Wound Chart Details [name] Date of Service: 02/05/2016 3:45 PM Patient Name: N. Patient Account Number: [id number] Medical Record Treating RN: Clover Mealy RN, [name], [place] Sink [id number] Number: Other Clinician: December 02, 1980 (35 y.o. Treating [name], Wu Date of Birth/Sex: Female) Physician/Extender: G Primary Care [name] Physician: Referring  Physician: [name] in Treatment: 22 Vital Signs Height(in): 68 Pulse(bpm): 78 Weight(lbs): 96 Blood Pressure 114/98 (mmHg): Body Mass Index(BMI): 15 Temperature(F): 97.7 Respiratory Rate 16 (breaths/min): Photos: [12:No Photos] [9:No Photos] [N/A:N/A] Wound Location: [12:Left Foot - Plantar, Circumfernential] [9:Left Calcaneous] [N/A:N/A] Wounding Event: [12:Gradually Appeared] [9:Pressure Injury] [N/A:N/A] Primary Etiology: [12:Diabetic Wound/Ulcer of Diabetic Wound/Ulcer of N/A the Lower Extremity] [9:the Lower Extremity] Comorbid History: [12:Anemia, Type I Diabetes, Anemia, Type I Diabetes, N/A End Stage Renal Disease, End Stage Renal Disease, Rheumatoid Arthritis, Neuropathy] [9:Rheumatoid Arthritis, Neuropathy] Date Acquired: [12:07/28/2015] [9:07/28/2015] [N/A:N/A] Weeks of Treatment: [12:22] [9:22] [N/A:N/A] Wound Status: [12:Open] [9:Open] [N/A:N/A] Pending Amputation on Yes [9:No] [N/A:N/A] Presentation: Measurements L x W x  D 10x7x0.2 [9:4x5x0.1] [N/A:N/A] (cm) Area (cm) : [12:54.978] [9:15.708] [N/A:N/A] Volume (cm) : [12:10.996] [9:1.571] [N/A:N/A] % Reduction in Area: [12:-27.30%] [9:15.40%] [N/A:N/A] % Reduction in Volume: -154.50% [9:15.40%] [N/A:N/A] Classification: [12:Grade 2] [9:Grade 2] [N/A:N/A] Exudate Amount: [12:Medium] [9:Medium] [N/A:N/A] Exudate Type: [12:Serosanguineous] [9:Serosanguineous] [N/A:N/A] Exudate Color: [12:red, brown Yes] [9:red, brown Yes] [N/A:N/A N/A] Foul Odor After Cleansing: Odor Anticipated Due to No No N/A Product Use: Wound Margin: Indistinct, nonvisible Distinct, outline attached N/A Granulation Amount: Small (1-33%) Small (1-33%) N/A Granulation Quality: Red Pink N/A Necrotic Amount: Large (67-100%) Large (67-100%) N/A Necrotic Tissue: Eschar, Adherent Slough Eschar, Adherent Slough N/A Exposed Structures: Fascia: No Fascia: No N/A Fat: No Fat: No Tendon: No Tendon: No Muscle: No Muscle:  No Joint: No Joint: No Bone: No Bone: No Limited to Skin Limited to Skin Breakdown Breakdown Epithelialization: None None N/A Periwound Skin Texture: Edema: Yes Edema: No N/A Excoriation: No Excoriation: No Induration: No Induration: No Callus: No Callus: No Crepitus: No Crepitus: No Fluctuance: No Fluctuance: No Friable: No Friable: No Rash: No Rash: No Scarring: No Scarring: No Periwound Skin Moist: Yes Moist: Yes N/A Moisture: Maceration: No Maceration: No Dry/Scaly: No Dry/Scaly: No Periwound Skin Color: Atrophie Blanche: No Atrophie Blanche: No N/A Cyanosis: No Cyanosis: No Ecchymosis: No Ecchymosis: No Erythema: No Erythema: No Hemosiderin Staining: No Hemosiderin Staining: No Mottled: No Mottled: No Pallor: No Pallor: No Rubor: No Rubor: No Temperature: No Abnormality No Abnormality N/A Tenderness on No Yes N/A Palpation: Wound Preparation: Ulcer Cleansing: Ulcer Cleansing: N/A Rinsed/Irrigated with Rinsed/Irrigated with Saline Saline Topical Anesthetic Topical Anesthetic Applied: None Applied: Other: lidocaine 4% Treatment Notes RICKI, VANHANDEL ([id number]) Electronic Signature(s) Signed: 02/05/2016 5:24:50 PM By: Elpidio Eric [name], RN Entered By: Elpidio Eric on 02/05/2016 16:38:00 [name] ([id number]) -------------------------------------------------------------------------------- Multi-Disciplinary Care Plan Details [name] Date of Service: 02/05/2016 3:45 PM Patient Name: N. Patient Account Number: [id number] Medical Record Treating RN: Clover Mealy RN, [name], [place] Sink [id number] Number: Other Clinician: 10/03/1980 (34 y.o. Treating [name], Wu Date of Birth/Sex: Female) Physician/Extender: G Primary Care [name] Physician: Referring Physician: [name] in Treatment: 22 Active Inactive HBO Nursing Diagnoses: Anxiety related to feelings of confinement associated with the hyperbaric oxygen  chamber Anxiety related to knowledge deficit of hyperbaric oxygen therapy and treatment procedures Discomfort related to temperature and humidity changes inside hyperbaric chamber Potential for barotraumas to ears, sinuses, teeth, and lungs or cerebral gas embolism related to changes in atmospheric pressure inside hyperbaric oxygen chamber Potential for oxygen toxicity seizures related to delivery of 100% oxygen at an increased atmospheric pressure Potential for pulmonary oxygen toxicity related to delivery of 100% oxygen at an increased atmospheric pressure Goals: Barotrauma will be prevented during HBO2 Date Initiated: 08/30/2015 Goal Status: Active Patient and/or family will be able to state/discuss factors appropriate to the management of their disease process during treatment Date Initiated: 08/30/2015 Goal Status: Active Patient will tolerate the hyperbaric oxygen therapy treatment Date Initiated: 08/30/2015 Goal Status: Active Patient will tolerate the internal climate of the chamber Date Initiated: 08/30/2015 Goal Status: Active Patient/caregiver will verbalize understanding of HBO goals, rationale, procedures and potential hazards Date Initiated: 08/30/2015 Goal Status: Active Signs and symptoms of pulmonary oxygen toxicity will be recognized and promptly addressed Date Initiated: 08/30/2015 ZYAIRAH, WACHA ([id number]) Goal Status: Active Signs and symptoms of seizure will be recognized and promptly addressed ; seizing patients will suffer no harm Date Initiated: 08/30/2015 Goal Status: Active Interventions: Administer a five (5) minute air break for patient if  signs and symptoms of seizure appear and notify the hyperbaric physician Administer a ten (10) minute air break for patient if signs and symptoms of seizure appear and notify the hyperbaric physician Administer decongestants, per physician orders, prior to HBO2 Administer the correct therapeutic gas delivery  based on the patients needs and limitations, per physician order Assess and provide for patientos comfort related to the hyperbaric environment and equalization of middle ear Assess for signs and symptoms related to adverse events, including but not limited to confinement anxiety, pneumothorax, oxygen toxicity and baurotrauma Assess patient for any history of confinement anxiety Assess patient's knowledge and expectations regarding hyperbaric medicine and provide education related to the hyperbaric environment, goals of treatment and prevention of adverse events Implement protocols to decrease risk of pneumothorax in high risk patients Notes: Abuse / Safety / Falls / Self Care Management Nursing Diagnoses: Potential for falls Goals: Patient will remain injury free Date Initiated: 09/19/2015 Goal Status: Active Patient/caregiver will verbalize/demonstrate measures taken to prevent injury and/or falls Date Initiated: 09/19/2015 Goal Status: Active Interventions: Assess fall risk on admission and as needed Assess impairment of mobility on admission and as needed per policy Notes: Orientation to the Wound Care Program Nursing Diagnoses: ARYAHI, DENZLER ([id number]) Knowledge deficit related to the wound healing center program Goals: Patient/caregiver will verbalize understanding of the Wound Healing Center Program Date Initiated: 08/30/2015 Goal Status: Active Interventions: Provide education on orientation to the wound center Notes: Pressure Nursing Diagnoses: Knowledge deficit related to causes and risk factors for pressure ulcer development Knowledge deficit related to management of pressures ulcers Potential for impaired tissue integrity related to pressure, friction, moisture, and shear Goals: Patient will remain free from development of additional pressure ulcers Date Initiated: 08/30/2015 Goal Status: Active Patient will remain free of pressure ulcers Date  Initiated: 08/30/2015 Goal Status: Active Patient/caregiver will verbalize risk factors for pressure ulcer development Date Initiated: 08/30/2015 Goal Status: Active Patient/caregiver will verbalize understanding of pressure ulcer management Date Initiated: 08/30/2015 Goal Status: Active Interventions: Assess: immobility, friction, shearing, incontinence upon admission and as needed Assess offloading mechanisms upon admission and as needed Assess potential for pressure ulcer upon admission and as needed Provide education on pressure ulcers Treatment Activities: Patient referred for home evaluation of offloading devices/mattresses : 01/15/2016 Patient referred for pressure reduction/relief devices : 01/15/2016 Pressure reduction/relief device ordered : 01/15/2016 Notes: TESSA, SEABERRY ([id number]) Wound/Skin Impairment Nursing Diagnoses: Impaired tissue integrity Knowledge deficit related to ulceration/compromised skin integrity Goals: Patient will have a decrease in wound volume by X% from date: (specify in notes) Date Initiated: 08/30/2015 Goal Status: Active Patient/caregiver will verbalize understanding of skin care regimen Date Initiated: 08/30/2015 Goal Status: Active Ulcer/skin breakdown will have a volume reduction of 30% by week 4 Date Initiated: 08/30/2015 Goal Status: Active Ulcer/skin breakdown will have a volume reduction of 50% by week 8 Date Initiated: 08/30/2015 Goal Status: Active Ulcer/skin breakdown will have a volume reduction of 80% by week 12 Date Initiated: 08/30/2015 Goal Status: Active Ulcer/skin breakdown will heal within 14 weeks Date Initiated: 08/30/2015 Goal Status: Active Interventions: Assess patient/caregiver ability to obtain necessary supplies Assess patient/caregiver ability to perform ulcer/skin care regimen upon admission and as needed Assess ulceration(s) every visit Provide education on ulcer and skin care Notes: Electronic  Signature(s) Signed: 02/05/2016 5:24:50 PM By: Elpidio Eric [name], RN Entered By: Elpidio Eric on 02/05/2016 16:27:04 [name] ([id number]) -------------------------------------------------------------------------------- Pain Assessment Details [name] Date of Service: 02/05/2016 3:45 PM Patient Name: N. Patient Account Number:  [id number] Medical Record Treating RN: Clover Mealy RN, [name], [place] Sink [id number] Number: Other Clinician: Jan 29, 1981 (34 y.o. Treating [name], Wu Date of Birth/Sex: Female) Physician/Extender: G Primary Care [name] Physician: Referring Physician: [name] in Treatment: 22 Active Problems Location of Pain Severity and Description of Pain Patient Has Paino No Site Locations Pain Management and Medication Current Pain Management: Electronic Signature(s) Signed: 02/05/2016 5:24:50 PM By: Elpidio Eric [name], RN Entered By: Elpidio Eric on 02/05/2016 16:14:12 [name] ([id number]) -------------------------------------------------------------------------------- Patient/Caregiver Education Details [name] Date of Service: 02/05/2016 3:45 PM Patient Name: N. Patient Account Number: [id number] Medical Record Treating RN: Clover Mealy RN, [name], [place] Sink [id number] Number: Other Clinician: October 26, 1980 (34 y.o. Treating [name], Wu Date of Birth/Gender: Female) Physician/Extender: G Primary Care Weeks in Treatment: 65 [name] Physician: Referring Physician: [name] Education Assessment Education Provided To: Patient Education Topics Provided Pressure: Methods: Explain/Verbal Responses: State content correctly Welcome To The Wound Care Center: Methods: Explain/Verbal Responses: State content correctly Wound/Skin Impairment: Methods: Explain/Verbal Responses: State content correctly Electronic Signature(s) Signed: 02/13/2016 12:32:26 PM By: Elpidio Eric [name], RN Entered By: Elpidio Eric on 02/13/2016  12:32:26 [name] ([id number]) -------------------------------------------------------------------------------- Wound Assessment Details [name] Date of Service: 02/05/2016 3:45 PM Patient Name: N. Patient Account Number: [id number] Medical Record Treating RN: Clover Mealy RN, [name], [place] Sink [id number] Number: Other Clinician: Apr 12, 1981 (34 y.o. Treating [name], Wu Date of Birth/Sex: Female) Physician/Extender: G Primary Care [name] Physician: Referring Physician: [name] in Treatment: 22 Wound Status Wound Number: 12 Primary Diabetic Wound/Ulcer of the Lower Etiology: Extremity Wound Location: Left Foot - Plantar, Circumfernential Wound Open Status: Wounding Event: Gradually Appeared Comorbid Anemia, Type I Diabetes, End Stage Date Acquired: 07/28/2015 History: Renal Disease, Rheumatoid Arthritis, Weeks Of Treatment: 22 Neuropathy Clustered Wound: No Pending Amputation On Presentation Photos Photo Uploaded By: Elpidio Eric on 02/05/2016 17:17:52 Wound Measurements Length: (cm) 10 Width: (cm) 7 Depth: (cm) 0.2 Area: (cm) 54.978 Volume: (cm) 10.996 % Reduction in Area: -27.3% % Reduction in Volume: -154.5% Epithelialization: None Tunneling: No Undermining: No Wound Description Classification: Grade 2 Wound Margin: Indistinct, nonvisible Exudate Amount: Medium Exudate Type: Serosanguineous Exudate Color: red, brown Cayer, Quanasia N. ([id number]) Foul Odor After Cleansing: Yes Due to Product Use: No Wound Bed Granulation Amount: Small (1-33%) Exposed Structure Granulation Quality: Red Fascia Exposed: No Necrotic Amount: Large (67-100%) Fat Layer Exposed: No Necrotic Quality: Eschar, Adherent Slough Tendon Exposed: No Muscle Exposed: No Joint Exposed: No Bone Exposed: No Limited to Skin Breakdown Periwound Skin Texture Texture Color No Abnormalities Noted: No No Abnormalities Noted: No Callus: No Atrophie  Blanche: No Crepitus: No Cyanosis: No Excoriation: No Ecchymosis: No Fluctuance: No Erythema: No Friable: No Hemosiderin Staining: No Induration: No Mottled: No Localized Edema: Yes Pallor: No Rash: No Rubor: No Scarring: No Temperature / Pain Moisture Temperature: No Abnormality No Abnormalities Noted: No Dry / Scaly: No Maceration: No Moist: Yes Wound Preparation Ulcer Cleansing: Rinsed/Irrigated with Saline Topical Anesthetic Applied: None Electronic Signature(s) Signed: 02/05/2016 5:24:50 PM By: Elpidio Eric [name], RN Entered By: Elpidio Eric on 02/05/2016 16:37:21 [name] ([id number]) -------------------------------------------------------------------------------- Wound Assessment Details [name] Date of Service: 02/05/2016 3:45 PM Patient Name: N. Patient Account Number: [id number] Medical Record Treating RN: Clover Mealy RN, [name], [place] Sink [id number] Number: Other Clinician: Jan 12, 1981 (34 y.o. Treating [name], Wu Date of Birth/Sex: Female) Physician/Extender: G Primary Care [name] Physician: Referring Physician: [name] in Treatment: 22 Wound Status Wound Number: 9 Primary Diabetic Wound/Ulcer of the Lower Etiology: Extremity Wound Location:  Left Calcaneous Wound Open Wounding Event: Pressure Injury Status: Date Acquired: 07/28/2015 Comorbid Anemia, Type I Diabetes, End Stage Weeks Of Treatment: 22 History: Renal Disease, Rheumatoid Arthritis, Clustered Wound: No Neuropathy Photos Photo Uploaded By: Elpidio Eric on 02/05/2016 17:18:14 Wound Measurements Length: (cm) 4 Width: (cm) 5 Depth: (cm) 0.1 Area: (cm) 15.708 Volume: (cm) 1.571 % Reduction in Area: 15.4% % Reduction in Volume: 15.4% Epithelialization: None Tunneling: No Undermining: No Wound Description Classification: Grade 2 Foul Odor Af Wound Margin: Distinct, outline attached Due to Produ Exudate Amount: Medium Exudate Type:  Serosanguineous Exudate Color: red, brown ter Cleansing: Yes ct Use: No Wound Bed Pinney, Kimiye N. ([id number]) Granulation Amount: Small (1-33%) Exposed Structure Granulation Quality: Pink Fascia Exposed: No Necrotic Amount: Large (67-100%) Fat Layer Exposed: No Necrotic Quality: Eschar, Adherent Slough Tendon Exposed: No Muscle Exposed: No Joint Exposed: No Bone Exposed: No Limited to Skin Breakdown Periwound Skin Texture Texture Color No Abnormalities Noted: No No Abnormalities Noted: No Callus: No Atrophie Blanche: No Crepitus: No Cyanosis: No Excoriation: No Ecchymosis: No Fluctuance: No Erythema: No Friable: No Hemosiderin Staining: No Induration: No Mottled: No Localized Edema: No Pallor: No Rash: No Rubor: No Scarring: No Temperature / Pain Moisture Temperature: No Abnormality No Abnormalities Noted: No Tenderness on Palpation: Yes Dry / Scaly: No Maceration: No Moist: Yes Wound Preparation Ulcer Cleansing: Rinsed/Irrigated with Saline Topical Anesthetic Applied: Other: lidocaine 4%, Treatment Notes Wound #12 (Left, Plantar, Circumferential Foot) 1. Cleansed with: Clean wound with Normal Saline 4. Dressing Applied: Santyl Ointment 5. Secondary Dressing Applied ABD and Kerlix/Conform 7. Secured with Tape Notes stretch netting Electronic Signature(s) Signed: 02/05/2016 5:24:50 PM By: Elpidio Eric [name], RN [place], Ledell Peoples ([id number]) Entered By: Elpidio Eric on 02/05/2016 16:37:43 [name] ([id number]) -------------------------------------------------------------------------------- Vitals Details [name] Date of Service: 02/05/2016 3:45 PM Patient Name: N. Patient Account Number: [id number] Medical Record Treating RN: Clover Mealy RN, [name], [place] Sink [id number] Number: Other Clinician: 07/08/1981 (34 y.o. Treating [name], Wu Date of Birth/Sex: Female) Physician/Extender: G Primary Care [name] Physician: Referring Physician: [name] in Treatment: 22 Vital Signs Time Taken: 16:17 Temperature (F): 97.7 Height (in): 68 Pulse (bpm): 78 Weight (lbs): 96 Respiratory Rate (breaths/min): 16 Body Mass Index (BMI): 14.6 Blood Pressure (mmHg): 114/98 Reference Range: 80 - 120 mg / dl Electronic Signature(s) Signed: 02/05/2016 5:24:50 PM By: Elpidio Eric [name], RN Entered By: Elpidio Eric on 02/05/2016 16:18:36

## 2016-02-11 ENCOUNTER — Ambulatory Visit: Payer: Medicare Other | Admitting: Internal Medicine

## 2016-02-14 ENCOUNTER — Ambulatory Visit: Payer: Medicare Other | Admitting: Surgery

## 2016-02-18 ENCOUNTER — Ambulatory Visit: Payer: Medicare Other | Admitting: Internal Medicine

## 2016-02-19 ENCOUNTER — Encounter: Payer: Medicare Other | Admitting: Internal Medicine

## 2016-02-19 DIAGNOSIS — E10621 Type 1 diabetes mellitus with foot ulcer: Secondary | ICD-10-CM | POA: Diagnosis not present

## 2016-02-20 NOTE — Progress Notes (Signed)
SHELTON, SOLER ([id number]) Visit Report for 02/19/2016 Chief Complaint Document Details PANAYIOTA, [name] Date of Service: 02/19/2016 2:15 PM Patient Name: N. Patient Account Number: [id number] Medical Record Treating RN: Clover Mealy RN, [name], [place] Sink [id number] Number: Other Clinician: 02/28/1981 (35 y.o. Treating Audrionna Lampton Date of Birth/Sex: Female) Physician/Extender: G Primary Care [name] Physician: Referring Physician: [name] in Treatment: 24 Information Obtained from: Patient Chief Complaint Patient in today for treatment of non-healing wound and HBO Treatment. she has just gotten out of hospital this week and is back to resume her hyperbaric oxygen therapy Electronic Signature(s) Signed: 02/19/2016 4:27:33 PM By: Baltazar Najjar MD Entered By: Baltazar Najjar on 02/19/2016 15:45:56 [name] ([id number]) -------------------------------------------------------------------------------- Debridement Details [name] Date of Service: 02/19/2016 2:15 PM Patient Name: N. Patient Account Number: [id number] Medical Record Treating RN: Clover Mealy RN, [name], [place] Sink [id number] Number: Other Clinician: [birth year] (35 y.o. [name] Date of Birth/Sex: Female) Physician/Extender: G Primary Care [name] Physician: Referring Physician: [name] in Treatment: 24 Debridement Performed for Wound #9 Left Calcaneous Assessment: Performed By: Physician [name], MD Debridement: Open Wound/Selective Debridement Selective Description: Pre-procedure Yes Verification/Time Out Taken: Start Time: 15:08 Pain Control: Lidocaine 4% Topical Solution Level: Non-Viable Tissue Total Area Debrided (L x 3.5 (cm) x 5.5 (cm) = 19.25 (cm) W): Tissue and other Non-Viable, Eschar material debrided: Instrument: Blade Bleeding: Minimum Hemostasis Achieved: Pressure End Time: 15:10 Procedural Pain: 0 Post Procedural  Pain: 0 Response to Treatment: Procedure was tolerated well Post Debridement Measurements of Total Wound Length: (cm) 3.5 Width: (cm) 5.5 Depth: (cm) 0.1 Volume: (cm) 1.512 Post Procedure Diagnosis Same as Pre-procedure Electronic Signature(s) Signed: 02/19/2016 4:27:33 PM By: Baltazar Najjar MD [name] ([id number]) Signed: 02/19/2016 4:54:14 PM By: Elpidio Eric [name], RN Entered By: Baltazar Najjar on 02/19/2016 15:45:29 [name] ([id number]) -------------------------------------------------------------------------------- Physical Exam Details [name] Date of Service: 02/19/2016 2:15 PM Patient Name: N. Patient Account Number: [id number] Medical Record Treating RN: Clover Mealy RN, [name], [place] Sink [id number] Number: Other Clinician: May 24, 1981 (35 y.o. Treating Malaky Wu Date of Birth/Sex: Female) Physician/Extender: G Primary Care [name] Physician: Referring Physician: [name] in Treatment: 24 Notes Wound exam; I continue to work on the left calcaneus. She has a rim of what appears to be healthy granulation under this I am banking on a healthy surface underneath this so I may be able to heal this. 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 Patient Account Number: [id number] Medical Record Treating RN: Clover Mealy RN, [name],  Sink [id number] Number: Other Clinician: Jan 22, 1981 (35 y.o. [name] Date of Birth/Sex: Female) Physician/Extender: G Primary Care [name] Physician: Referring Physician:  [name] in Treatment: 62 Verbal / Phone Orders: Yes Clinician: [name], RN, [name], [name] Read Back and Verified: Yes Diagnosis Coding Wound Cleansing Wound #12 Left,Plantar,Circumferential Foot o Cleanse wound with mild soap and water Wound #9 Left Calcaneous o Cleanse wound with mild soap and water Primary Wound Dressing Wound #12 Left,Plantar,Circumferential Foot o Aquacel Ag - to wet areas in between the toes o Other: - betadine STICKS paint to dry areas ****PLEASE ORDER SOME FOR PT***** Wound #9 Left Calcaneous o Santyl Ointment Secondary Dressing  Wound #12 Left,Plantar,Circumferential Foot o ABD pad o Gauze and Kerlix/Conform Wound #9 Left Calcaneous o ABD pad o Gauze and Kerlix/Conform Dressing Change Frequency Wound #12 Left,Plantar,Circumferential Foot o Change dressing every day. Wound #9 Left Calcaneous o Change dressing every day. [name], [name] ([id number]) Follow-up Appointments Wound #12 Left,Plantar,Circumferential Foot o Other: - as patient is able Wound #9 Left Calcaneous o Other: - as patient is able Off-Loading Wound #12 Left,Plantar,Circumferential Foot o Open toe surgical shoe with peg assist. Wound #9 Left Calcaneous o Open toe surgical shoe with peg assist. Electronic Signature(s) Signed: 02/19/2016 4:27:33 PM By: Baltazar Najjar MD Signed: 02/19/2016 4:54:14 PM By: Elpidio Eric [name], RN Entered By: Elpidio Eric on 02/19/2016 15:14:56 [name] ([id number]) -------------------------------------------------------------------------------- Problem List Details [name] Date of Service: 02/19/2016 2:15 PM Patient Name: N. Patient Account Number: [id number] Medical Record Treating RN: Clover Mealy RN, [name], [place] Sink [id number] Number: Other Clinician: 1980-11-28 (35 y.o. 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, right leg L89.622 Pressure ulcer of left heel, stage 2 08/30/2015 Yes Z89.511 Acquired absence of right leg below knee 11/29/2015 Yes Inactive Problems Resolved Problems ICD-10 Code Description Active Date Resolved Date L02.611 Cutaneous abscess of right foot 10/01/2015 10/01/2015 SCHERRY, LAVERNE ([id number]) Electronic Signature(s) Signed: 02/19/2016 4:27:33 PM By: Baltazar Najjar MD Entered By: Baltazar Najjar on 02/19/2016 15:45:02 [name] ([id number]) -------------------------------------------------------------------------------- Progress Note Details [name] Date of Service: 02/19/2016 2:15 PM Patient Name: N. Patient Account Number: [id number] Medical Record Treating RN: Clover Mealy RN, [name], [place] Sink [id number] Number: Other Clinician: September 15, 1981 (34 y.o. Treating Irby Fails Date of Birth/Sex: Female) Physician/Extender: G Primary Care [name] Physician: Referring Physician: [name] in Treatment: 24 Subjective Chief Complaint Information obtained from Patient Patient in today for treatment of non-healing wound and HBO Treatment. she has just gotten out of hospital this week and is back to resume her hyperbaric oxygen therapy Objective Constitutional Vitals Time Taken: 2:45 PM, Height: 68 in, Weight: 96 lbs, BMI: 14.6, Temperature: 97.7 F, Pulse: 72 bpm, Respiratory Rate: 16 breaths/min, Blood Pressure: 106/81 mmHg. Integumentary (Hair, Skin) Wound #12 status  is Open. Original cause of wound was Gradually Appeared. The wound is located on the Left,Plantar,Circumferential Foot. The wound measures 10cm length x 8.5cm width x 1cm depth; 66.759cm^2 area and 66.759cm^3 volume. The wound is limited to skin breakdown. There is no tunneling or undermining noted. There is a medium amount of serosanguineous drainage noted. The wound margin is indistinct and nonvisible. There is small (1-33%) red granulation within the wound bed. There is a large (67- 100%) amount of necrotic tissue within the wound bed including Eschar and Adherent Slough. The periwound skin appearance exhibited: Localized Edema, Moist. The periwound skin appearance did not exhibit: Callus, Crepitus, Excoriation, Fluctuance, Friable, Induration, Rash, Scarring, Dry/Scaly, Maceration, Atrophie Blanche, Cyanosis, Ecchymosis, Hemosiderin Staining, Mottled, Pallor, Rubor, Erythema. Periwound temperature was noted as No Abnormality. Wound #9 status is Open. Original cause of wound was Pressure Injury. The wound is located on the Left Calcaneous. The wound measures 3.5cm length x 5.5cm width x  0.1cm depth; 15.119cm^2 area and 1.512cm^3 volume. The wound is limited to skin breakdown. There is a medium amount of serosanguineous drainage noted. The wound margin is distinct with the outline attached to the wound base. There is small (1-33%) pink granulation within the wound bed. There is a large (67-100%) amount of necrotic tissue within the wound bed including Eschar and Adherent Slough. The periwound skin Lofton, Semaja N. ([id number]) appearance exhibited: Moist. The periwound skin appearance did not exhibit: Callus, Crepitus, Excoriation, Fluctuance, Friable, Induration, Localized Edema, Rash, Scarring, Dry/Scaly, Maceration, Atrophie Blanche, Cyanosis, Ecchymosis, Hemosiderin Staining, Mottled, Pallor, Rubor, Erythema. Periwound temperature was noted as No Abnormality. The periwound has  tenderness on palpation. 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 There was a Non-Viable Tissue Open Wound/Selective [phone]) debridement with total area of 19.25 sq cm performed by [name], MD. with the following instrument(s): Blade to remove Non-Viable tissue/material including Eschar after achieving pain control using Lidocaine 4% Topical Solution. A time out was conducted prior to the start of the procedure. A Minimum amount of bleeding was controlled with Pressure. The procedure was tolerated well with a pain level of 0 throughout and a pain level of 0 following the procedure. Post Debridement Measurements: 3.5cm length x 5.5cm width x 0.1cm depth; 1.512cm^3 volume. Post procedure Diagnosis Wound #9: Same as Pre-Procedure Plan Wound Cleansing: Wound #12 Left,Plantar,Circumferential Foot: Cleanse wound with mild soap and water Wound #9 Left Calcaneous: Nieland, Merida N. ([id number]) Cleanse wound with mild soap and water Primary Wound Dressing: Wound #12 Left,Plantar,Circumferential Foot: Aquacel Ag - to wet areas in between the toes Other: - betadine STICKS paint to dry areas ****PLEASE ORDER SOME FOR PT***** Wound #9 Left Calcaneous: Santyl Ointment Secondary Dressing: Wound #12 Left,Plantar,Circumferential Foot: ABD pad Gauze and Kerlix/Conform Wound #9 Left Calcaneous: ABD pad Gauze and Kerlix/Conform Dressing Change Frequency: Wound #12  Left,Plantar,Circumferential Foot: Change dressing every day. Wound #9 Left Calcaneous: Change dressing every day. Follow-up Appointments: Wound #12 Left,Plantar,Circumferential Foot: Other: - as patient is able Wound #9 Left Calcaneous: Other: - as patient is able Off-Loading: Wound #12 Left,Plantar,Circumferential Foot: Open toe surgical shoe with peg assist. Wound #9 Left Calcaneous: Open toe surgical shoe with peg assist. 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 Patient Account Number: [id number] Medical Record Treating RN: Clover Mealy RN, [name], [place] Sink [id number] Number: Other Clinician: 12/27/1980 (34 y.o. 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 I70.261 Atherosclerosis of native arteries of  extremities with gangrene, right leg L89.622 Pressure ulcer of left heel, stage 2 Z89.511 Acquired absence of right leg below knee Facility Procedures CPT4 Code: [id number] Description: 97597 - DEBRIDE WOUND 1ST 20 SQ CM OR < ICD-10 Description Diagnosis E10.621 Type 1 diabetes mellitus with foot ulcer Modifier: Quantity: 1 Physician Procedures CPT4 Code: [id number] Description: 97597 - WC PHYS DEBR WO  ANESTH 20 SQ CM ICD-10 Description Diagnosis E10.621 Type 1 diabetes mellitus with foot ulcer Modifier: Quantity: 1 Electronic Signature(s) Signed: 02/19/2016 4:27:33 PM By: Baltazar Najjar MD Entered By: Baltazar Najjar on 02/19/2016 15:55:13

## 2016-02-20 NOTE — Progress Notes (Signed)
TIFFNEY, HAUGHTON ([id number]) Visit Report for 02/19/2016 Arrival Information Details HERMIE, REAGOR Date of Service: 02/19/2016 2:15 PM Patient Name: N. Patient Account Number: [id number] Medical Record Treating RN: Clover Mealy RN, [name], [place] Sink [id number] Number: Other Clinician: 02/26/1981 (35 y.o. Treating [name], [name] Date of Birth/Sex: Female) Physician/Extender: G Primary Care [name] Physician: Referring Physician: [name] in Treatment: 24 Visit Information History Since Last Visit Added or deleted any medications: No Patient Arrived: Wheel Chair Any new allergies or adverse reactions: No Arrival Time: 14:35 Had a fall or experienced change in No activities of daily living that may affect Accompanied By: mom risk of falls: Transfer Assistance: None Signs or symptoms of abuse/neglect since last No Patient Identification Verified: Yes visito Secondary Verification Process Yes Hospitalized since last visit: No Completed: Has Dressing in Place as Prescribed: Yes Patient Requires Transmission-Based No Pain Present Now: No Precautions: Patient Has Alerts: Yes Electronic Signature(s) Signed: 02/19/2016 4:54:14 PM By: Elpidio Eric [name], RN Entered By: Elpidio Eric on 02/19/2016 14:45:44 [name] ([id number]) -------------------------------------------------------------------------------- Encounter Discharge Information Details [name] Date of Service: 02/19/2016 2:15 PM Patient Name: N. Patient Account Number: [id number] Medical Record Treating RN: Clover Mealy RN, [name], [place] Sink [id number] Number: Other Clinician: Apr 05, 1981 (35 y.o. Treating [name], [name] Date of Birth/Sex: Female) Physician/Extender: G Primary Care [name] Physician: Referring Physician: [name] in Treatment: 24 Encounter Discharge Information Items Discharge Pain Level: 0 Discharge Condition: Stable Ambulatory Status: Wheelchair Discharge  Destination: Home Transportation: Private Auto Accompanied By: mother Schedule Follow-up Appointment: No Medication Reconciliation completed and provided to Patient/Care No Sovereign Ramiro: Provided on Clinical Summary of Care: 02/19/2016 Form Type Recipient Paper Patient HB Electronic Signature(s) Signed: 02/19/2016 3:29:46 PM By: Gwenlyn Perking Entered By: Gwenlyn Perking on 02/19/2016 15:29:46 [name] ([id number]) -------------------------------------------------------------------------------- Lower Extremity Assessment Details [name] Date of Service: 02/19/2016 2:15 PM Patient Name: N. Patient Account Number: [id number] Medical Record Treating RN: Clover Mealy RN, [name], [place] Sink [id number] Number: Other Clinician: August 25, 1981 (35 y.o. Treating [name], [name] Date of Birth/Sex: Female) Physician/Extender: G Primary Care [name] Physician: Referring Physician: [name] in Treatment: 24 Vascular Assessment Pulses: Posterior Tibial Extremity colors, hair growth, and conditions: Extremity Color: [Left:Normal] Hair Growth on Extremity: [Left:Yes] Temperature of Extremity: [Left:Warm] Capillary Refill: [Left:< 3 seconds] Toe Nail Assessment Left: Right: Thick: Yes Discolored: Yes Deformed: Yes Improper Length and Hygiene: No Electronic Signature(s) Signed: 02/19/2016 4:54:14 PM By: Elpidio Eric [name], RN Entered By: Elpidio Eric on 02/19/2016 14:49:56 [name] ([id number]) -------------------------------------------------------------------------------- Multi Wound Chart Details [name] Date of Service: 02/19/2016 2:15 PM Patient Name: N. Patient Account Number: [id number] Medical Record Treating RN: Clover Mealy RN, [name], [place] Sink [id number] Number: Other Clinician: 06-Aug-1981 (35 y.o. 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] [9:the Lower Extremity] Comorbid History: [12:Anemia, Type I Diabetes, Anemia, Type I Diabetes, N/A End Stage Renal Disease, End Stage Renal Disease, Rheumatoid Arthritis, Neuropathy] [9:Rheumatoid Arthritis, Neuropathy] Date Acquired: [12:07/28/2015] [9:07/28/2015] [N/A:N/A] Weeks of Treatment: [12:24] [9:24] [N/A:N/A] Wound Status: [12:Open] [9:Open] [N/A:N/A] Pending Amputation on Yes [9:No] [N/A:N/A] Presentation: Measurements L x W x D  10x8.5x1 [9:3.5x5.5x0.1] [N/A:N/A] (cm) Area (cm) : [12:66.759] [9:15.119] [N/A:N/A] Volume (cm) : [12:66.759] [9:1.512] [N/A:N/A] % Reduction in Area: [12:-54.50%] [9:18.60%] [N/A:N/A] % Reduction in Volume: -1445.30% [9:18.60%] [N/A:N/A] Classification: [12:Grade 2] [9:Grade 2] [N/A:N/A] Exudate Amount: [12:Medium] [9:Medium] [N/A:N/A] Exudate Type: [12:Serosanguineous] [9:Serosanguineous] [N/A:N/A] Exudate Color: [12:red, brown Yes] [9:red, brown Yes] [N/A:N/A N/A] Foul Odor After Cleansing: Odor Anticipated Due to No No N/A Product Use: Wound Margin: Indistinct, nonvisible Distinct, outline attached N/A Granulation Amount: Small (1-33%) Small (1-33%) N/A Granulation Quality: Red Pink N/A Necrotic Amount: Large (67-100%) Large (67-100%) N/A Necrotic Tissue: Eschar, Adherent Slough Eschar, Adherent Slough N/A Exposed Structures: Fascia: No Fascia: No N/A Fat: No Fat: No Tendon: No Tendon: No Muscle: No Muscle: No Joint: No Joint:  No Bone: No Bone: No Limited to Skin Limited to Skin Breakdown Breakdown Epithelialization: None None N/A Periwound Skin Texture: Edema: Yes Edema: No N/A Excoriation: No Excoriation: No Induration: No Induration: No Callus: No Callus: No Crepitus: No Crepitus: No Fluctuance: No Fluctuance: No Friable: No Friable: No Rash: No Rash: No Scarring: No Scarring: No Periwound Skin Moist: Yes Moist: Yes N/A Moisture: Maceration: No Maceration: No Dry/Scaly: No Dry/Scaly: No Periwound Skin Color: Atrophie Blanche: No Atrophie Blanche: No N/A Cyanosis: No Cyanosis: No Ecchymosis: No Ecchymosis: No Erythema: No Erythema: No Hemosiderin Staining: No Hemosiderin Staining: No Mottled: No Mottled: No Pallor: No Pallor: No Rubor: No Rubor: No Temperature: No Abnormality No Abnormality N/A Tenderness on No Yes N/A Palpation: Wound Preparation: Ulcer Cleansing: Ulcer Cleansing: N/A Rinsed/Irrigated with Rinsed/Irrigated with Saline, Other: surg soap Saline and water Topical Anesthetic Topical Anesthetic Applied: Other: lidocaine Applied: None 4% Treatment Notes EUELLA, WELLARD ([id number]) Electronic Signature(s) Signed: 02/19/2016 4:54:14 PM By: Elpidio Eric [name], RN Entered By: Elpidio Eric on 02/19/2016 15:08:58 [name] ([id number]) -------------------------------------------------------------------------------- Multi-Disciplinary Care Plan Details [name] Date of Service: 02/19/2016 2:15 PM Patient Name: N. Patient Account Number: [id number] Medical Record Treating RN: Clover Mealy RN, [name], [place] Sink [id number] Number: Other Clinician: [birth year] (34 y.o. Treating [name], [name] Date of Birth/Sex: Female) Physician/Extender: G Primary Care [name] Physician: Referring Physician: [name] in Treatment: 24 Active Inactive HBO Nursing Diagnoses: Anxiety related to feelings of confinement associated with the hyperbaric  oxygen chamber Anxiety related to knowledge deficit of hyperbaric oxygen therapy and treatment procedures Discomfort related to temperature and humidity changes inside hyperbaric chamber Potential for barotraumas to ears, sinuses, teeth, and lungs or cerebral gas embolism related to changes in atmospheric pressure inside hyperbaric oxygen chamber Potential for oxygen toxicity seizures related to delivery of 100% oxygen at an increased atmospheric pressure Potential for pulmonary oxygen toxicity related to delivery of 100% oxygen at an increased atmospheric pressure Goals: Barotrauma will be prevented during HBO2 Date Initiated: 08/30/2015 Goal Status: Active Patient and/or family will be able to state/discuss factors appropriate to the management of their disease process during treatment Date Initiated: 08/30/2015 Goal Status: Active Patient will tolerate the hyperbaric oxygen therapy treatment Date Initiated: 08/30/2015 Goal Status: Active Patient will tolerate the internal climate of the chamber Date Initiated: 08/30/2015 Goal Status: Active Patient/caregiver will verbalize understanding of HBO goals, rationale, procedures and potential hazards Date Initiated: 08/30/2015 Goal Status: Active Signs and symptoms of pulmonary oxygen toxicity will be recognized and promptly addressed Date Initiated: 08/30/2015 ADELADE, OSTERMEIER ([id number]) Goal Status: Active Signs and symptoms of seizure will be recognized and promptly addressed ; seizing patients will suffer no harm Date Initiated: 08/30/2015 Goal Status: Active Interventions: Administer a five (5) minute air  break for patient if signs and symptoms of seizure appear and notify the hyperbaric physician Administer a ten (10) minute air break for patient if signs and symptoms of seizure appear and notify the hyperbaric physician Administer decongestants, per physician orders, prior to HBO2 Administer the correct therapeutic gas  delivery based on the patients needs and limitations, per physician order Assess and provide for patientos comfort related to the hyperbaric environment and equalization of middle ear Assess for signs and symptoms related to adverse events, including but not limited to confinement anxiety, pneumothorax, oxygen toxicity and baurotrauma Assess patient for any history of confinement anxiety Assess patient's knowledge and expectations regarding hyperbaric medicine and provide education related to the hyperbaric environment, goals of treatment and prevention of adverse events Implement protocols to decrease risk of pneumothorax in high risk patients Notes: Abuse / Safety / Falls / Self Care Management Nursing Diagnoses: Potential for falls Goals: Patient will remain injury free Date Initiated: 09/19/2015 Goal Status: Active Patient/caregiver will verbalize/demonstrate measures taken to prevent injury and/or falls Date Initiated: 09/19/2015 Goal Status: Active Interventions: Assess fall risk on admission and as needed Assess impairment of mobility on admission and as needed per policy Notes: Orientation to the Wound Care Program Nursing Diagnoses: JENDAYI, BERLING ([id number]) Knowledge deficit related to the wound healing center program Goals: Patient/caregiver will verbalize understanding of the Wound Healing Center Program Date Initiated: 08/30/2015 Goal Status: Active Interventions: Provide education on orientation to the wound center Notes: Pressure Nursing Diagnoses: Knowledge deficit related to causes and risk factors for pressure ulcer development Knowledge deficit related to management of pressures ulcers Potential for impaired tissue integrity related to pressure, friction, moisture, and shear Goals: Patient will remain free from development of additional pressure ulcers Date Initiated: 08/30/2015 Goal Status: Active Patient will remain free of pressure ulcers Date  Initiated: 08/30/2015 Goal Status: Active Patient/caregiver will verbalize risk factors for pressure ulcer development Date Initiated: 08/30/2015 Goal Status: Active Patient/caregiver will verbalize understanding of pressure ulcer management Date Initiated: 08/30/2015 Goal Status: Active Interventions: Assess: immobility, friction, shearing, incontinence upon admission and as needed Assess offloading mechanisms upon admission and as needed Assess potential for pressure ulcer upon admission and as needed Provide education on pressure ulcers Treatment Activities: Patient referred for home evaluation of offloading devices/mattresses : 01/15/2016 Patient referred for pressure reduction/relief devices : 01/15/2016 Pressure reduction/relief device ordered : 01/15/2016 Notes: HOLLYNN, GARNO ([id number]) Wound/Skin Impairment Nursing Diagnoses: Impaired tissue integrity Knowledge deficit related to ulceration/compromised skin integrity Goals: Patient will have a decrease in wound volume by X% from date: (specify in notes) Date Initiated: 08/30/2015 Goal Status: Active Patient/caregiver will verbalize understanding of skin care regimen Date Initiated: 08/30/2015 Goal Status: Active Ulcer/skin breakdown will have a volume reduction of 30% by week 4 Date Initiated: 08/30/2015 Goal Status: Active Ulcer/skin breakdown will have a volume reduction of 50% by week 8 Date Initiated: 08/30/2015 Goal Status: Active Ulcer/skin breakdown will have a volume reduction of 80% by week 12 Date Initiated: 08/30/2015 Goal Status: Active Ulcer/skin breakdown will heal within 14 weeks Date Initiated: 08/30/2015 Goal Status: Active Interventions: Assess patient/caregiver ability to obtain necessary supplies Assess patient/caregiver ability to perform ulcer/skin care regimen upon admission and as needed Assess ulceration(s) every visit Provide education on ulcer and skin care Notes: Electronic  Signature(s) Signed: 02/19/2016 4:54:14 PM By: Elpidio Eric [name], RN Entered By: Elpidio Eric on 02/19/2016 15:08:48 [name] ([id number]) -------------------------------------------------------------------------------- Pain Assessment Details [name] Date of Service: 02/19/2016 2:15 PM Patient Name:  N. Patient Account Number: [id number] Medical Record Treating RN: Clover Mealy RN, [name], [place] Sink [id number] Number: Other Clinician: [birth year] (34 y.o. Treating [name], [name] Date of Birth/Sex: Female) Physician/Extender: G Primary Care [name] Physician: Referring Physician: [name] in Treatment: 24 Active Problems Location of Pain Severity and Description of Pain Patient Has Paino No Site Locations Pain Management and Medication Current Pain Management: Electronic Signature(s) Signed: 02/19/2016 4:54:14 PM By: Elpidio Eric [name], RN Entered By: Elpidio Eric on 02/19/2016 14:45:54 [name] ([id number]) -------------------------------------------------------------------------------- Patient/Caregiver Education Details [name] Date of Service: 02/19/2016 2:15 PM Patient Name: N. Patient Account Number: [id number] Medical Record Treating RN: Clover Mealy RN, [name], [place] Sink [id number] Number: Other Clinician: 1981/04/24 (34 y.o. Treating [name], [name] Date of Birth/Gender: Female) Physician/Extender: G Primary Care Weeks in Treatment: 50 [name] Physician: Referring Physician: [name] Education Assessment Education Provided To: Patient Education Topics Provided Pressure: Methods: Explain/Verbal Responses: State content correctly Welcome To The Wound Care Center: Methods: Explain/Verbal Responses: State content correctly Wound/Skin Impairment: Methods: Explain/Verbal Responses: State content correctly Electronic Signature(s) Signed: 02/19/2016 4:54:14 PM By: Elpidio Eric [name], RN Entered By: Elpidio Eric on 02/19/2016  15:28:59 [name] ([id number]) -------------------------------------------------------------------------------- Wound Assessment Details [name] Date of Service: 02/19/2016 2:15 PM Patient Name: N. Patient Account Number: [id number] Medical Record Treating RN: Clover Mealy RN, [name], [place] Sink [id number] Number: Other Clinician: Jul 19, 1981 (34 y.o. Treating [name], [name] Date of Birth/Sex: Female) Physician/Extender: G Primary Care [name] Physician: Referring Physician: [name] in Treatment: 24 Wound Status Wound Number: 12 Primary Diabetic Wound/Ulcer of the Lower Etiology: Extremity Wound Location: Left Foot - Plantar, Circumfernential Wound Open Status: Wounding Event: Gradually Appeared Comorbid Anemia, Type I Diabetes, End Stage Date Acquired: 07/28/2015 History: Renal Disease, Rheumatoid Arthritis, Weeks Of Treatment: 24 Neuropathy Clustered Wound: No Pending Amputation On Presentation Photos Photo Uploaded By: Elpidio Eric on 02/19/2016 16:50:25 Wound Measurements Length: (cm) 10 Width: (cm) 8.5 Depth: (cm) 1 Area: (cm) 66.759 Volume: (cm) 66.759 % Reduction in Area: -54.5% % Reduction in Volume: -1445.3% Epithelialization: None Tunneling: No Undermining: No Wound Description Classification: Grade 2 Wound Margin: Indistinct, nonvisible Exudate Amount: Medium Exudate Type: Serosanguineous Exudate Color: red, brown Scinto, Arizbeth N. ([id number]) Foul Odor After Cleansing: Yes Due to Product Use: No Wound Bed Granulation Amount: Small (1-33%) Exposed Structure Granulation Quality: Red Fascia Exposed: No Necrotic Amount: Large (67-100%) Fat Layer Exposed: No Necrotic Quality: Eschar, Adherent Slough Tendon Exposed: No Muscle Exposed: No Joint Exposed: No Bone Exposed: No Limited to Skin Breakdown Periwound Skin Texture Texture Color No Abnormalities Noted: No No Abnormalities Noted: No Callus: No Atrophie  Blanche: No Crepitus: No Cyanosis: No Excoriation: No Ecchymosis: No Fluctuance: No Erythema: No Friable: No Hemosiderin Staining: No Induration: No Mottled: No Localized Edema: Yes Pallor: No Rash: No Rubor: No Scarring: No Temperature / Pain Moisture Temperature: No Abnormality No Abnormalities Noted: No Dry / Scaly: No Maceration: No Moist: Yes Wound Preparation Ulcer Cleansing: Rinsed/Irrigated with Saline, Other: surg soap and water, Topical Anesthetic Applied: None Treatment Notes Wound #12 (Left, Plantar, Circumferential Foot) 1. Cleansed with: Cleanse wound with antibacterial soap and water 4. Dressing Applied: Aquacel Ag Other dressing (specify in notes) 5. Secondary Dressing Applied Gauze and Kerlix/Conform 7. Secured with Tape Notes betadine paint and stretch netting ARYAA, BUNTING ([id number]) Electronic Signature(s) Signed: 02/19/2016 4:54:14 PM By: Elpidio Eric [name], RN Entered By: Elpidio Eric on 02/19/2016 14:56:55 [name] ([id number]) -------------------------------------------------------------------------------- Wound Assessment Details [name] Date of Service: 02/19/2016 2:15 PM Patient  Name: N. Patient Account Number: [id number] Medical Record Treating RN: Clover Mealy RN, [name], [place] Sink [id number] Number: Other Clinician: 1981-04-11 (34 y.o. Treating [name], [name] Date of Birth/Sex: Female) Physician/Extender: G Primary Care [name] Physician: Referring Physician: [name] in Treatment: 24 Wound Status Wound Number: 9 Primary Diabetic Wound/Ulcer of the Lower Etiology: Extremity Wound Location: Left Calcaneous Wound Open Wounding Event: Pressure Injury Status: Date Acquired: 07/28/2015 Comorbid Anemia, Type I Diabetes, End Stage Weeks Of Treatment: 24 History: Renal Disease, Rheumatoid Arthritis, Clustered Wound: No Neuropathy Photos Photo Uploaded By: Elpidio Eric on 02/19/2016  16:51:14 Wound Measurements Length: (cm) 3.5 Width: (cm) 5.5 Depth: (cm) 0.1 Area: (cm) 15.119 Volume: (cm) 1.512 % Reduction in Area: 18.6% % Reduction in Volume: 18.6% Epithelialization: None Wound Description Classification: Grade 2 Foul Odor Af Wound Margin: Distinct, outline attached Due to Produ Exudate Amount: Medium Exudate Type: Serosanguineous Exudate Color: red, brown ter Cleansing: Yes ct Use: No Wound Bed Retana, [name] N. ([id number]) Granulation Amount: Small (1-33%) Exposed Structure Granulation Quality: Pink Fascia Exposed: No Necrotic Amount: Large (67-100%) Fat Layer Exposed: No Necrotic Quality: Eschar, Adherent Slough Tendon Exposed: No Muscle Exposed: No Joint Exposed: No Bone Exposed: No Limited to Skin Breakdown Periwound Skin Texture Texture Color No Abnormalities Noted: No No Abnormalities Noted: No Callus: No Atrophie Blanche: No Crepitus: No Cyanosis: No Excoriation: No Ecchymosis: No Fluctuance: No Erythema: No Friable: No Hemosiderin Staining: No Induration: No Mottled: No Localized Edema: No Pallor: No Rash: No Rubor: No Scarring: No Temperature / Pain Moisture Temperature: No Abnormality No Abnormalities Noted: No Tenderness on Palpation: Yes Dry / Scaly: No Maceration: No Moist: Yes Wound Preparation Ulcer Cleansing: Rinsed/Irrigated with Saline Topical Anesthetic Applied: Other: lidocaine 4%, Treatment Notes Wound #9 (Left Calcaneous) 1. Cleansed with: Clean wound with Normal Saline 4. Dressing Applied: Santyl Ointment 5. Secondary Dressing Applied Gauze and Kerlix/Conform 7. Secured with Tape Notes stretch netting Electronic Signature(s) Signed: 02/19/2016 4:54:14 PM By: Elpidio Eric [name], RN [place], Ledell Peoples ([id number]) Entered By: Elpidio Eric on 02/19/2016 14:57:12 [name] ([id number]) -------------------------------------------------------------------------------- Vitals  Details [name] Date of Service: 02/19/2016 2:15 PM Patient Name: N. Patient Account Number: [id number] Medical Record Treating RN: Clover Mealy RN, [name], [place] Sink [id number] Number: Other Clinician: 15-Mar-1981 (34 y.o. Treating [name], [name] Date of Birth/Sex: Female) Physician/Extender: G Primary Care [name] Physician: Referring Physician: [name] in Treatment: 24 Vital Signs Time Taken: 14:45 Temperature (F): 97.7 Height (in): 68 Pulse (bpm): 72 Weight (lbs): 96 Respiratory Rate (breaths/min): 16 Body Mass Index (BMI): 14.6 Blood Pressure (mmHg): 106/81 Reference Range: 80 - 120 mg / dl Electronic Signature(s) Signed: 02/19/2016 4:54:14 PM By: Elpidio Eric [name], RN Entered By: Elpidio Eric on 02/19/2016 14:52:40

## 2016-02-25 ENCOUNTER — Ambulatory Visit: Payer: Medicare Other | Admitting: Internal Medicine

## 2016-02-26 ENCOUNTER — Encounter: Payer: Medicare Other | Admitting: Internal Medicine

## 2016-02-26 DIAGNOSIS — E10621 Type 1 diabetes mellitus with foot ulcer: Secondary | ICD-10-CM | POA: Diagnosis not present

## 2016-02-27 NOTE — Progress Notes (Signed)
[name], [name] ([id number]) Visit Report for 02/26/2016 Chief Complaint Document Details [name], [name] Date of Service: 02/26/2016 3:00 PM Patient Name: N. Patient Account Number: [id number] Medical Record Treating RN: [name] [id number] Number: Other Clinician: Oct 04, Wu (34 y.o. [name] Date of Birth/Sex: Female) Physician/Extender: [name] Primary Care [name] Physician: Referring Physician: [name] Wu in Treatment: 25 Information Obtained from: Patient Chief Complaint Patient in today for treatment of non-healing wound and [name] Treatment. she has just gotten out of hospital this week and is back to resume her hyperbaric oxygen therapy Electronic Signature(s) Signed: 02/26/2016 5:25:36 PM By: [name] Najjar MD Entered By: [name] on 02/26/2016 17:01:01 [name] ([id number]) -------------------------------------------------------------------------------- Debridement Details [name] Date of Service: 02/26/2016 3:00 PM Patient Name: N. Patient Account Number: [id number] Medical Record Treating RN: [name] [id number] Number: Other Clinician: 1981/02/03 (34 y.o. Treating Mert Dietrick Date of Birth/Sex: Female) Physician/Extender: [name] Primary Care [name] Physician: Referring Physician: [name] Wu in Treatment: 25 Debridement Performed for Wound #9 Left Calcaneous Assessment: Performed By: Physician [name] Caul, MD Debridement: Debridement Pre-procedure No Verification/Time Out Taken: Start Time: 16:15 Pain Control: Other : lidocaine 4% Level: Skin/Subcutaneous Tissue Total Area Debrided (L x 3.5 (cm) x 5.5 (cm) = 19.25 (cm) W): Tissue and other Viable, Non-Viable, Eschar, Fibrin/Slough, Subcutaneous material debrided: Instrument: Curette Bleeding: Minimum Hemostasis Achieved: Pressure End Time: 16:18 Procedural Pain: 0 Post Procedural Pain: 0 Response to Treatment:  Procedure was tolerated well Post Debridement Measurements of Total Wound Length: (cm) 3.5 Width: (cm) 5.5 Depth: (cm) 0.2 Volume: (cm) 3.024 Post Procedure Diagnosis Same as Pre-procedure Electronic Signature(s) Signed: 02/26/2016 5:20:12 PM By: [name] Gurney RN, Wu, [name] Signed: 02/26/2016 5:25:36 PM By: [name] Najjar MD Entered By: [name] on 02/26/2016 17:00:24 [name] ([id number]) [name], [name] ([id number]) -------------------------------------------------------------------------------- HPI Details [name] Date of Service: 02/26/2016 3:00 PM Patient Name: N. Patient Account Number: [id number] Medical Record Treating RN: [name] [id number] Number: Other Clinician: 06/18/Wu (34 y.o. [name] Date of Birth/Sex: Female) Physician/Extender: [name] Primary Care [name] Physician: Referring Physician: [name] Wu in Treatment: 25 History of Present Illness Location: dry gangrene both feet and heels Quality: Patient reports No Pain. Severity: Patient states wound are getting worse. Duration: Patient has had the wound for > 4months prior to seeking treatment at the wound center Context: The wound appeared gradually over time Modifying Factors: she has been in and out of hospital over the last 2 months Associated Signs and Symptoms: Patient reports having difficulty standing for long periods. HPI Description: [name] is a 35 y.o. female who presents to our wound center, back in June 2016, referred by her PCP [name] for nonhealing ulcers on the lateral aspect of the right heel. Of note she has a history of type 1 diabetes mellitus that has been uncontrolled. Past medical history significant for type 1 diabetes mellitus not controlled, ankylosing spondylitis, anorexia nervosa, irritable bowel syndrome, chronic kidney disease, chronic diarrhea. she then developed gangrene of both feet due to severe  peripheral vascular disease and also had gangrene of the tips of her fingers due to upper extremity vascular disease. She was being worked up by vascular surgery at [facility] and at [facility] and has had several procedures done there. She started with hyperbaric oxygen therapy and had a total of 40 treatments the last one being on 06/20/2015. After the initial treatment of hyperbaric oxygen therapy she started having ear problems and had ultimately  to use myringotomy tubes and this was done bilaterally. Since then her ears have been doing fine. In late September, she had seen vascular and hand surgeons. since then she's been in [place] at the rec center for surgery involving extensive vascular procedures for the upper extremities. She was then at [facility] with GI bleeds both upper and lower and has been in and out of hospital for that. She has recently been out of hospital for the last week. 09/09/2015 -- she was unable to get here in time to start her hyperbaric oxygen today and hence is only here for a wound care visit. 09/19/2015 -- she has been having vancomycin during her dialysis and continues to have vascular appointments and the procedure is been set for early January. She has been unable to make it for her hemodialysis due to various medical symptoms. 09/30/2014 -- her vancomycin was stopped on 09/25/2015 and the mother has noticed the right foot has started draining for the last 3 days. Addendum: after examining the patient I was able to talk to her primary vascular surgeon [name] at the [facility]. I told him about the necrotic area on the plantar aspect of right foot which is now wet gangrene and he agreed with me that he would admit her at [facility] under her care and synchronize further treatment. We have discussed her poor prognosis and he and I discussed the need for hospice care [facility], [name] ([id number]) and for sitting down and talking to the patient and her mother  and giving them a proper detail of the prognosis. 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 She was worked up with MRIs, abdomen aortogram and placement of left-sided angioplasties were done. After a prolonged hospital she she was discharged home and was told to wear shrinker sock and stump protector and see her surgeon for further instructions regarding wound care and suture removal. Asked to take long-term doxycycline. 01/15/16; this is a patient I haven'[name] seen before although she is been followed by Dr. Meyer Russel in this clinic today. She is a type I diabetic with severe PAD macrovascular disease. She has had a previous BKA. She has dry gangrene of the tips of her fingers which she showed me on the right to. She also has dry gangrene of the left first second and third toes and a portion of her proximal foot around these areas. She is followed by vascular surgery at [facility] and saw them recently they are not going to do surgery as of yet. She has a large black eschar over her heel which is beginning to separate in some areas. As I understand think she is paining these with Betadine. There is been some suggestion about retrying hyperbarics on her although she is still not able to commit to the frequency of treatment that would be necessary to see improvement. She is also on Monday Wednesday Friday dialysis 01/21/16; the patient returns to see me today with regards to the left heel. Apparently she is not scheduled for any further attempts at revascularization of the left foot. She has dry gangrene of the left  medial foot, first and second  toes and there is already some separation developing here. 01/28/16; the patient returns today for attention to the left foot specifically the left calcaneal ulcer. This is covered in a thick black eschar. I crosshatched this last week and we have been using Santyl. 02/05/16; I continue to work on the thick black eschar on the patient's left calcaneus. She is using Santyl that this side crosshatched this area and the eschar is beginning to loosen. 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 This is beginning to separate and liquefy as well especially between the first and second toes. In the time being her major complaint is fatigue at dialysis 02/26/16 I continue to work on the patient's left calcaneus thick adherent black eschar. I crosshatched this area and we have been using Santyl although it is very adherent area I remove some nonviable tissue today what I can see of this actually looks surprisingly good. On the same foot she has dry gangrene on the first and second toes and part of the forefoot underneath this. This is beginning to separate. Electronic Signature(s) Signed: 02/26/2016 5:25:36 PM By: [name] Najjar MD Entered By: [name] on 02/26/2016 17:02:10 [name] ([id number]) -------------------------------------------------------------------------------- Physical Exam Details [name] Date of Service: 02/26/2016 3:00 PM Patient Name: N. Patient Account Number: [id number] Medical Record Treating RN: [name] [id number] Number: Other Clinician: 11/11/Wu (34 y.o. Treating Torie Priebe Date of Birth/Sex: Female) Physician/Extender: [name] Primary Care [name] Physician: Referring Physician: [name] Wu in Treatment: 25 Notes Wound exam; we continue to work on the eschar on the  left calcaneus. What I can see of the tissue here looks possibly viable. Her first and second toes are having more separation. Electronic Signature(s) Signed: 02/26/2016 5:25:36 PM By: [name] Najjar MD Entered By: [name] on 02/26/2016 17:02:48 [name] ([id number]) -------------------------------------------------------------------------------- Physician Orders Details [name] Date of Service: 02/26/2016 3:00 PM Patient Name: N. Patient Account Number: [id number] Medical Record Treating RN: [name] [id number] Number: Other Clinician: 1981/08/25 (34 y.o. Treating Teriann Livingood Date of Birth/Sex: Female) Physician/Extender: [name] Primary Care [name] Physician: Referring Physician: [name] Wu in Treatment: 25 Verbal / Phone Orders: Yes Clinician: Huel Wu Read Back and Verified: No Diagnosis Coding Wound Cleansing Wound #12 Left,Plantar,Circumferential Foot o Cleanse wound with mild soap and water Wound #9 Left Calcaneous o Cleanse wound with mild soap and water Primary Wound Dressing Wound #12 Left,Plantar,Circumferential Foot o Aquacel Ag - to wet areas in between the toes o Other: - betadine STICKS paint to dry areas ****PLEASE ORDER SOME FOR PT***** Wound #9 Left Calcaneous o Santyl Ointment Secondary Dressing Wound #12 Left,Plantar,Circumferential Foot o ABD pad o Gauze and Kerlix/Conform Wound #9 Left Calcaneous o ABD pad o Gauze and Kerlix/Conform Dressing Change Frequency Wound #12 Left,Plantar,Circumferential Foot o Change dressing every day. Wound #9 Left Calcaneous o Change dressing every day. FEMALE, IAFRATE ([id number]) Follow-up Appointments Wound #12 Left,Plantar,Circumferential Foot o Return Appointment in 2 weeks. o Other: - as patient is able Wound #9 Left Calcaneous o Return Appointment in 2 weeks. o Other: - as patient is able Off-Loading Wound #12  Left,Plantar,Circumferential Foot o Open toe surgical shoe with peg assist. Wound #9 Left Calcaneous o Open toe surgical shoe with peg assist. Electronic Signature(s) Signed: 02/26/2016 5:20:12 PM By: [name] Gurney RN, Wu, [name] Signed: 02/26/2016 5:25:36 PM By:  [name] Najjar MD Entered By: [name] Gurney, RN, Wu, [name] on 02/26/2016 16:19:13 [name] ([id number]) -------------------------------------------------------------------------------- Problem List Details [name] Date of Service: 02/26/2016 3:00 PM Patient Name: N. Patient Account Number: [id number] Medical Record Treating RN: [name] [id number] Number: Other Clinician: 1981-04-30 (34 y.o. Treating Vinessa Macconnell Date of Birth/Sex: Female) Physician/Extender: [name] Primary Care [name] Physician: Referring Physician: [name] Wu in Treatment: 25 Active Problems ICD-10 Encounter Code Description Active Date Diagnosis E10.621 Type 1 diabetes mellitus with foot ulcer 08/30/2015 Yes E10.52 Type 1 diabetes mellitus with diabetic peripheral 08/30/2015 Yes angiopathy with gangrene I70.245 Atherosclerosis of native arteries of left leg with ulceration 08/30/2015 Yes of other part of foot I70.261 Atherosclerosis of native arteries of extremities with 08/30/2015 Yes gangrene, right leg L89.622 Pressure ulcer of left heel, stage 2 08/30/2015 Yes Z89.511 Acquired absence of right leg below knee 11/29/2015 Yes Inactive Problems Resolved Problems ICD-10 Code Description Active Date Resolved Date L02.611 Cutaneous abscess of right foot 10/01/2015 10/01/2015 [name], [name] ([id number]) Electronic Signature(s) Signed: 02/26/2016 5:25:36 PM By: [name] Najjar MD Entered By: [name] on 02/26/2016 16:59:56 [name] ([id number]) -------------------------------------------------------------------------------- Progress Note Details [name] Date of Service: 02/26/2016 3:00  PM Patient Name: N. Patient Account Number: [id number] Medical Record Treating RN: [name] [id number] Number: Other Clinician: 1981-09-06 (34 y.o. Treating Callen Vancuren Date of Birth/Sex: Female) Physician/Extender: [name] Primary Care [name] Physician: Referring Physician: [name] Wu in Treatment: 25 Subjective Chief Complaint Information obtained from Patient Patient in today for treatment of non-healing wound and [name] Treatment. she has just gotten out of hospital this week and is back to resume her hyperbaric oxygen therapy History of Present Illness (HPI) The following HPI elements were documented for the patient's wound: Location: dry gangrene both feet and heels Quality: Patient reports No Pain. Severity: Patient states wound are getting worse. Duration: Patient has had the wound for > 25months prior to seeking treatment at the wound center Context: The wound appeared gradually over time Modifying Factors: she has been in and out of hospital over the last 2 months Associated Signs and Symptoms: Patient reports having difficulty standing for long periods. Tinlee Sessum is a 35 y.o. female who presents to our wound center, back in June 2016, referred by her PCP [name] for nonhealing ulcers on the lateral aspect of the right heel. Of note she has a history of type 1 diabetes mellitus that has been uncontrolled. Past medical history significant for type 1 diabetes mellitus not controlled, ankylosing spondylitis, anorexia nervosa, irritable bowel syndrome, chronic kidney disease, chronic diarrhea. she then developed gangrene of both feet due to severe peripheral vascular disease and also had gangrene of the tips of her fingers due to upper extremity vascular disease. She was being worked up by vascular surgery at [facility] and at [facility] and has had several procedures done there. She started with hyperbaric oxygen therapy and had a total of 40 treatments the  last one being on 06/20/2015. After the initial treatment of hyperbaric oxygen therapy she started having ear problems and had ultimately to use myringotomy tubes and this was done bilaterally. Since then her ears have been doing fine. In late September, she had seen vascular and hand surgeons. since then she's been in [place] at the rec center for surgery involving extensive vascular procedures for the upper extremities. She was then at [facility] with GI bleeds both upper and lower and has been in and out of hospital for that. She  has recently been out of hospital for the last week. 09/09/2015 -- she was unable to get here in time to start her hyperbaric oxygen today and hence is only here for a wound care visit. 09/19/2015 -- she has been having vancomycin during her dialysis and continues to have vascular Behnken, Zailyn N. ([id number]) appointments and the procedure is been set for early January. She has been unable to make it for her hemodialysis due to various medical symptoms. 09/30/2014 -- her vancomycin was stopped on 09/25/2015 and the mother has noticed the right foot has started draining for the last 3 days. Addendum: after examining the patient I was able to talk to her primary vascular surgeon [name] at the [facility]. I told him about the necrotic area on the plantar aspect of right foot which is now wet gangrene and he agreed with me that he would admit her at [facility] under her care and synchronize further treatment. We have discussed her poor prognosis and he and I discussed the need for hospice care and for sitting down and talking to the patient and her mother and giving them a proper detail of the prognosis. 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 She was worked up with MRIs, abdomen aortogram and placement of left-sided angioplasties were done. After a prolonged hospital she she was discharged home and was told to wear shrinker sock and stump protector and see her surgeon for further instructions regarding wound care and suture removal. Asked to take long-term doxycycline. 01/15/16; this is a patient I haven'[name] seen before although she is been followed by Dr. Meyer Russel in this clinic today. She is a type I diabetic with severe PAD macrovascular disease. She has had a previous BKA. She has dry gangrene of the tips of her fingers which she showed me on the right to. She also has dry gangrene of the left first second and third toes and a portion of her proximal foot around these areas. She is followed by vascular surgery at [facility] and saw them recently they are not going to do surgery as of yet. She has a large black eschar over her heel which is beginning to separate in some areas. As I understand think she is paining these with Betadine. There is been some suggestion about retrying hyperbarics on her although she is still not able to commit to the frequency of treatment that would be necessary to see improvement. She is also on Monday Wednesday Friday dialysis 01/21/16; the patient returns to see me today with regards to the left heel. Apparently she is not scheduled for any further attempts at revascularization of the left foot. 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 01/28/16; the patient returns today for attention to the left foot specifically the left calcaneal ulcer. This is covered in a thick black eschar. I crosshatched this last week and we have been using Santyl. 02/05/16;  I continue to work on the thick black eschar on the patient's left calcaneus. She is using Santyl that this side crosshatched this area and the  eschar is beginning to loosen. 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 This is beginning to separate and liquefy as well especially between the first and second toes. In the time being her major complaint is fatigue at dialysis 02/26/16 I continue to work on the patient's left calcaneus thick adherent black eschar. I crosshatched this area and we have been using Santyl although it is very adherent area I remove some nonviable tissue today what I can see of this actually looks surprisingly good. On the same foot she has dry gangrene on the first and second toes and part of the forefoot underneath this. This is beginning to separate. Objective [name], [name] ([id number]) Constitutional Vitals Time Taken: 4:10 PM, Height: 68 in, Weight: 96 lbs, BMI: 14.6, Temperature: 97.9 F, Pulse: 72 bpm, Respiratory Rate: 18 breaths/min, Blood Pressure: 116/56 mmHg. Integumentary (Hair, Skin) Wound #12 status is Open. Original cause of wound was Gradually Appeared. The wound is located on the Left,Plantar,Circumferential Foot. The wound measures 14cm length x 8.5cm width x 1cm depth; 93.462cm^2 area and 93.462cm^3 volume. Wound #9 status is Open. Original cause of wound was Pressure Injury. The wound is located on the Left Calcaneous. The wound measures 3.5cm length x 5.5cm width x 0.1cm depth; 15.119cm^2 area and 1.512cm^3 volume. Assessment Active Problems ICD-10 E10.621 - Type 1 diabetes mellitus with foot ulcer E10.52 - Type 1 diabetes mellitus with diabetic peripheral angiopathy with gangrene I70.245 - Atherosclerosis of native arteries of left leg with ulceration of other part of foot I70.261 - Atherosclerosis of native  arteries of extremities with gangrene, right leg Q68.341 - Pressure ulcer of left heel, stage 2 Z89.511 - Acquired absence of right leg below knee Procedures Wound #9 Wound #9 is a Diabetic Wound/Ulcer of the Lower Extremity located on the Left Calcaneous . There was a Skin/Subcutaneous Tissue Debridement ([id number]) debridement with total area of 19.25 sq cm performed by [name] Caul, MD. with the following instrument(s): Curette to remove Viable and Non-Viable tissue/material including Fibrin/Slough, Eschar, and Subcutaneous after achieving pain control using Other (lidocaine 4%). A time out was not conducted prior to the start of the procedure. A Minimum amount of bleeding was controlled with Pressure. The procedure was tolerated well with a pain level of 0 throughout and a pain level of 0 following the procedure. Post Debridement Measurements: 3.5cm length x 5.5cm width x 0.2cm depth; 3.024cm^3 volume. Post procedure Diagnosis Wound #9: Same as Pre-Procedure [name], [name] ([id number]) Plan Wound Cleansing: Wound #12 Left,Plantar,Circumferential Foot: Cleanse wound with mild soap and water Wound #9 Left Calcaneous: Cleanse wound with mild soap and water Primary Wound Dressing: Wound #12 Left,Plantar,Circumferential Foot: Aquacel Ag - to wet areas in between the toes Other: - betadine STICKS paint to dry areas ****PLEASE ORDER SOME FOR PT***** Wound #9 Left Calcaneous: Santyl Ointment Secondary Dressing: Wound #12 Left,Plantar,Circumferential Foot: ABD pad Gauze and Kerlix/Conform Wound #9 Left Calcaneous: ABD pad Gauze and Kerlix/Conform Dressing Change Frequency: Wound #12 Left,Plantar,Circumferential Foot: Change dressing every day. Wound #9 Left Calcaneous: Change dressing every day. Follow-up Appointments: Wound #12 Left,Plantar,Circumferential Foot: Return Appointment in 2 weeks. Other: - as patient is able Wound #9 Left Calcaneous: Return Appointment  in 2 weeks. Other: - as patient is able Off-Loading: Wound #12 Left,Plantar,Circumferential Foot: Open toe surgical shoe with peg assist. Wound #9 Left Calcaneous: Open toe surgical shoe  with peg assist. #1 we are going to continue with the Santyl to the heel follow-up in 2 weeks #2 continue with the Aquacel Ag to the separating left first and second toes [name], [name] ([id number]) Electronic Signature(s) Signed: 02/26/2016 5:25:36 PM By: [name] Najjar MD Entered By: [name] on 02/26/2016 17:03:31 [name] ([id number]) -------------------------------------------------------------------------------- SuperBill Details [name] Date of Service: 02/26/2016 Patient Name: N. Patient Account Number: [id number] Medical Record Treating RN: [name] [id number] Number: Other Clinician: 02[birth year]04-Wu (34 y.o. 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

## 2016-02-27 NOTE — Progress Notes (Signed)
HOLLAND, KOTTER ([id number]) Visit Report for 02/26/2016 Arrival Information Details MISTI, TOWLE Date of Service: 02/26/2016 3:00 PM Patient Name: N. Patient Account Number: [id number] Medical Record Treating RN: Huel Coventry [id number] Number: Other Clinician: 1980-10-27 (35 y.o. Treating [name], [name] Date of Birth/Sex: Female) Physician/Extender: G Primary Care [name] Physician: Referring Physician: [name] in Treatment: 25 Visit Information History Since Last Visit Pain Present Now: No Patient Arrived: Wheel Chair Arrival Time: 16:00 Accompanied By: mom Transfer Assistance: Manual Patient Identification Verified: Yes Secondary Verification Process Yes Completed: Patient Requires Transmission-Based No Precautions: Patient Has Alerts: Yes Electronic Signature(s) Signed: 02/26/2016 5:20:12 PM By: Elliot Gurney, RN, [name], Kim RN, [name] Entered By: Elliot Gurney, RN, [name], Kim on 02/26/2016 16:06:38 [name] ([id number]) -------------------------------------------------------------------------------- Encounter Discharge Information Details [name] Date of Service: 02/26/2016 3:00 PM Patient Name: N. Patient Account Number: [id number] Medical Record Treating RN: Huel Coventry [id number] Number: Other Clinician: [birth year] (35 y.o. Treating [name], [name] Date of Birth/Sex: Female) Physician/Extender: G Primary Care [name] Physician: Referring Physician: [name] in Treatment: 25 Encounter Discharge Information Items Discharge Pain Level: 0 Discharge Condition: Stable Ambulatory Status: Wheelchair Discharge Destination: Home Transportation: Private Auto Accompanied By: mom Schedule Follow-up Appointment: Yes Medication Reconciliation completed and provided to Patient/Care Yes Redell Nazir: Provided on Clinical Summary of Care: 02/26/2016 Form Type Recipient Paper Patient HB Electronic Signature(s) Signed: 02/26/2016  5:20:12 PM By: Elliot Gurney RN, [name], Kim RN, [name] Previous Signature: 02/26/2016 4:30:21 PM Version By: Gwenlyn Perking Entered By: Elliot Gurney RN, [name], Kim on 02/26/2016 17:13:49 [name] ([id number]) -------------------------------------------------------------------------------- Lower Extremity Assessment Details [name] Date of Service: 02/26/2016 3:00 PM Patient Name: N. Patient Account Number: [id number] Medical Record Treating RN: Huel Coventry [id number] Number: Other Clinician: 1981-01-18 (35 y.o. Treating [name], [name] Date of Birth/Sex: Female) Physician/Extender: G Primary Care [name] Physician: Referring Physician: [name] in Treatment: 25 Vascular Assessment Pulses: Posterior Tibial Dorsalis Pedis Palpable: [Left:Yes] Extremity colors, hair growth, and conditions: Extremity Color: [Left:Normal] Hair Growth on Extremity: [Left:Yes] Temperature of Extremity: [Left:Cool] Capillary Refill: [Left:> 3 seconds] Toe Nail Assessment Left: Right: Thick: No Discolored: No Deformed: No Improper Length and Hygiene: No Electronic Signature(s) Signed: 02/26/2016 5:20:12 PM By: Elliot Gurney, RN, [name], Kim RN, [name] Entered By: Elliot Gurney, RN, [name], Kim on 02/26/2016 16:18:07 [name] ([id number]) -------------------------------------------------------------------------------- Multi Wound Chart Details [name] Date of Service: 02/26/2016 3:00 PM Patient Name: N. Patient Account Number: [id number] Medical Record Treating RN: Huel Coventry [id number] Number: Other Clinician: [birth year] (35 y.o. Treating [name], [name] Date of Birth/Sex: Female) Physician/Extender: G Primary Care [name] Physician: Referring Physician: [name] in Treatment: 25 Photos: [12:No Photos] [9:No Photos] [N/A:N/A] Wound Location: [12:Left, Plantar, Circumferential Foot] [9:Left Calcaneous] [N/A:N/A] Wounding Event: [12:Gradually Appeared]  [9:Pressure Injury] [N/A:N/A] Primary Etiology: [12:Diabetic Wound/Ulcer of the Lower Extremity] [9:Diabetic Wound/Ulcer of the Lower Extremity] [N/A:N/A] Date Acquired: [12:07/28/2015] [9:07/28/2015] [N/A:N/A] Weeks of Treatment: [12:25] [9:25] [N/A:N/A] Wound Status: [12:Open] [9:Open] [N/A:N/A] Pending Amputation on Yes [9:No] [N/A:N/A] Presentation: Measurements L x W x D 14x8.5x1 [9:3.5x5.5x0.1] [N/A:N/A] (cm) Area (cm) : [12:93.462] [9:15.119] [N/A:N/A] Volume (cm) : [12:93.462] [9:1.512] [N/A:N/A] % Reduction in Area: [12:-116.40%] [9:18.60%] [N/A:N/A] % Reduction in Volume: -2063.50% [9:18.60%] [N/A:N/A] Classification: [12:Grade 2] [9:Grade 2] [N/A:N/A] Periwound Skin Texture: No Abnormalities Noted [9:No Abnormalities Noted] [N/A:N/A] Periwound Skin [12:No Abnormalities Noted] [9:No Abnormalities Noted] [N/A:N/A] Moisture: Periwound Skin Color: No Abnormalities Noted [9:No Abnormalities Noted] [N/A:N/A] Tenderness on [12:No] [9:No] [N/A:N/A] Treatment Notes Electronic Signature(s) Signed: 02/26/2016 5:20:12 PM By: Elliot Gurney,  RN, [name], Radio producer, [name] Entered By: Elliot Gurney, RN, [name], Kim on 02/26/2016 16:14:28 [name] ([id number]) -------------------------------------------------------------------------------- Multi-Disciplinary Care Plan Details [name] Date of Service: 02/26/2016 3:00 PM Patient Name: N. Patient Account Number: [id number] Medical Record Treating RN: Huel Coventry [id number] Number: Other Clinician: Sep 09, 1981 (35 y.o. Treating [name], [name] Date of Birth/Sex: Female) Physician/Extender: G Primary Care [name] Physician: Referring Physician: [name] in Treatment: 25 Active Inactive HBO Nursing Diagnoses: Anxiety related to feelings of confinement associated with the hyperbaric oxygen chamber Anxiety related to knowledge deficit of hyperbaric oxygen therapy and treatment procedures Discomfort related to temperature and  humidity changes inside hyperbaric chamber Potential for barotraumas to ears, sinuses, teeth, and lungs or cerebral gas embolism related to changes in atmospheric pressure inside hyperbaric oxygen chamber Potential for oxygen toxicity seizures related to delivery of 100% oxygen at an increased atmospheric pressure Potential for pulmonary oxygen toxicity related to delivery of 100% oxygen at an increased atmospheric pressure Goals: Barotrauma will be prevented during HBO2 Date Initiated: 08/30/2015 Goal Status: Active Patient and/or family will be able to state/discuss factors appropriate to the management of their disease process during treatment Date Initiated: 08/30/2015 Goal Status: Active Patient will tolerate the hyperbaric oxygen therapy treatment Date Initiated: 08/30/2015 Goal Status: Active Patient will tolerate the internal climate of the chamber Date Initiated: 08/30/2015 Goal Status: Active Patient/caregiver will verbalize understanding of HBO goals, rationale, procedures and potential hazards Date Initiated: 08/30/2015 Goal Status: Active Signs and symptoms of pulmonary oxygen toxicity will be recognized and promptly addressed Date Initiated: 08/30/2015 [name] ([id number]) Goal Status: Active Signs and symptoms of seizure will be recognized and promptly addressed ; seizing patients will suffer no harm Date Initiated: 08/30/2015 Goal Status: Active Interventions: Administer a five (5) minute air break for patient if signs and symptoms of seizure appear and notify the hyperbaric physician Administer a ten (10) minute air break for patient if signs and symptoms of seizure appear and notify the hyperbaric physician Administer decongestants, per physician orders, prior to HBO2 Administer the correct therapeutic gas delivery based on the patients needs and limitations, per physician order Assess and provide for patientos comfort related to the hyperbaric  environment and equalization of middle ear Assess for signs and symptoms related to adverse events, including but not limited to confinement anxiety, pneumothorax, oxygen toxicity and baurotrauma Assess patient for any history of confinement anxiety Assess patient's knowledge and expectations regarding hyperbaric medicine and provide education related to the hyperbaric environment, goals of treatment and prevention of adverse events Implement protocols to decrease risk of pneumothorax in high risk patients Notes: Abuse / Safety / Falls / Self Care Management Nursing Diagnoses: Potential for falls Goals: Patient will remain injury free Date Initiated: 09/19/2015 Goal Status: Active Patient/caregiver will verbalize/demonstrate measures taken to prevent injury and/or falls Date Initiated: 09/19/2015 Goal Status: Active Interventions: Assess fall risk on admission and as needed Assess impairment of mobility on admission and as needed per policy Notes: Orientation to the Wound Care Program Nursing Diagnoses: TSION, INGHRAM ([id number]) Knowledge deficit related to the wound healing center program Goals: Patient/caregiver will verbalize understanding of the Wound Healing Center Program Date Initiated: 08/30/2015 Goal Status: Active Interventions: Provide education on orientation to the wound center Notes: Pressure Nursing Diagnoses: Knowledge deficit related to causes and risk factors for pressure ulcer development Knowledge deficit related to management of pressures ulcers Potential for impaired tissue integrity related to pressure, friction, moisture, and shear Goals: Patient will remain free  from development of additional pressure ulcers Date Initiated: 08/30/2015 Goal Status: Active Patient will remain free of pressure ulcers Date Initiated: 08/30/2015 Goal Status: Active Patient/caregiver will verbalize risk factors for pressure ulcer development Date Initiated:  08/30/2015 Goal Status: Active Patient/caregiver will verbalize understanding of pressure ulcer management Date Initiated: 08/30/2015 Goal Status: Active Interventions: Assess: immobility, friction, shearing, incontinence upon admission and as needed Assess offloading mechanisms upon admission and as needed Assess potential for pressure ulcer upon admission and as needed Provide education on pressure ulcers Treatment Activities: Patient referred for home evaluation of offloading devices/mattresses : 01/15/2016 Patient referred for pressure reduction/relief devices : 01/15/2016 Pressure reduction/relief device ordered : 01/15/2016 Notes: LORRIN, BODNER ([id number]) Wound/Skin Impairment Nursing Diagnoses: Impaired tissue integrity Knowledge deficit related to ulceration/compromised skin integrity Goals: Patient will have a decrease in wound volume by X% from date: (specify in notes) Date Initiated: 08/30/2015 Goal Status: Active Patient/caregiver will verbalize understanding of skin care regimen Date Initiated: 08/30/2015 Goal Status: Active Ulcer/skin breakdown will have a volume reduction of 30% by week 4 Date Initiated: 08/30/2015 Goal Status: Active Ulcer/skin breakdown will have a volume reduction of 50% by week 8 Date Initiated: 08/30/2015 Goal Status: Active Ulcer/skin breakdown will have a volume reduction of 80% by week 12 Date Initiated: 08/30/2015 Goal Status: Active Ulcer/skin breakdown will heal within 14 weeks Date Initiated: 08/30/2015 Goal Status: Active Interventions: Assess patient/caregiver ability to obtain necessary supplies Assess patient/caregiver ability to perform ulcer/skin care regimen upon admission and as needed Assess ulceration(s) every visit Provide education on ulcer and skin care Notes: Electronic Signature(s) Signed: 02/26/2016 5:20:12 PM By: Elliot Gurney, RN, [name], Kim RN, [name] Entered By: Elliot Gurney, RN, [name], Kim on 02/26/2016 16:14:19 [name] ([id number]) -------------------------------------------------------------------------------- Pain Assessment Details [name] Date of Service: 02/26/2016 3:00 PM Patient Name: N. Patient Account Number: [id number] Medical Record Treating RN: Huel Coventry [id number] Number: Other Clinician: 1980-11-23 (35 y.o. Treating [name], [name] Date of Birth/Sex: Female) Physician/Extender: G Primary Care [name] Physician: Referring Physician: [name] in Treatment: 25 Active Problems Location of Pain Severity and Description of Pain Patient Has Paino No Site Locations With Dressing Change: No Pain Management and Medication Current Pain Management: Electronic Signature(s) Signed: 02/26/2016 5:20:12 PM By: Elliot Gurney, RN, [name], Kim RN, [name] Entered By: Elliot Gurney, RN, [name], Kim on 02/26/2016 16:06:46 [name] ([id number]) -------------------------------------------------------------------------------- Patient/Caregiver Education Details [name] Date of Service: 02/26/2016 3:00 PM Patient Name: N. Patient Account Number: [id number] Medical Record Treating RN: Huel Coventry [id number] Number: Other Clinician: [birth year] (35 y.o. Treating [name], [name] Date of Birth/Gender: Female) Physician/Extender: G Primary Care Weeks in Treatment: 25 [name] Physician: Referring Physician: [name] Education Assessment Education Provided To: Patient Education Topics Provided Wound/Skin Impairment: Handouts: Caring for Your Ulcer, Other: wound care as prescribed Methods: Demonstration, Explain/Verbal Responses: State content correctly Electronic Signature(s) Signed: 02/26/2016 5:20:12 PM By: Elliot Gurney, RN, [name], Kim RN, [name] Entered By: Elliot Gurney, RN, [name], Kim on 02/26/2016 17:14:09 [name] ([id number]) -------------------------------------------------------------------------------- Wound Assessment Details [name] Date of  Service: 02/26/2016 3:00 PM Patient Name: N. Patient Account Number: [id number] Medical Record Treating RN: Huel Coventry [id number] Number: Other Clinician: 1981-09-11 (35 y.o. Treating [name], [name] Date of Birth/Sex: Female) Physician/Extender: G Primary Care [name] Physician: Referring Physician: [name] in Treatment: 25 Wound Status Wound Number: 12 Primary Diabetic Wound/Ulcer of the Lower Etiology: Extremity Wound Location: Left, Plantar, Circumferential Foot Wound Status: Open Wounding Event: Gradually Appeared Date Acquired: 07/28/2015 Weeks Of Treatment: 25  Clustered Wound: No Pending Amputation On Presentation Photos Photo Uploaded By: Elliot Gurney, RN, [name], Kim on 02/26/2016 17:40:45 Wound Measurements Length: (cm) 14 Width: (cm) 8.5 Depth: (cm) 1 Area: (cm) 93.462 Volume: (cm) 93.462 % Reduction in Area: -116.4% % Reduction in Volume: -2063.5% Wound Description Classification: Grade 2 Periwound Skin Texture Texture Color No Abnormalities Noted: No No Abnormalities Noted: No Moisture [place], Chloie N. ([id number]) No Abnormalities Noted: No Treatment Notes Wound #12 (Left, Plantar, Circumferential Foot) 1. Cleansed with: Cleanse wound with antibacterial soap and water 4. Dressing Applied: Aquacel Ag Other dressing (specify in notes) 5. Secondary Dressing Applied Gauze and Kerlix/Conform 7. Secured with Tape Notes betadine paint and stretch netting Electronic Signature(s) Signed: 02/26/2016 5:20:12 PM By: Elliot Gurney, RN, [name], Kim RN, [name] Entered By: Elliot Gurney, RN, [name], Kim on 02/26/2016 16:08:30 [name] ([id number]) -------------------------------------------------------------------------------- Wound Assessment Details [name] Date of Service: 02/26/2016 3:00 PM Patient Name: N. Patient Account Number: [id number] Medical Record Treating RN: Huel Coventry [id number] Number: Other Clinician: Dec 09, 1980 (35 y.o.  Treating [name], [name] Date of Birth/Sex: Female) Physician/Extender: G Primary Care [name] Physician: Referring Physician: [name] in Treatment: 25 Wound Status Wound Number: 9 Primary Diabetic Wound/Ulcer of the Lower Etiology: Extremity Wound Location: Left Calcaneous Wound Status: Open Wounding Event: Pressure Injury Date Acquired: 07/28/2015 Weeks Of Treatment: 25 Clustered Wound: No Photos Photo Uploaded By: Elliot Gurney, RN, [name], Kim on 02/26/2016 17:40:46 Wound Measurements Length: (cm) 3.5 Width: (cm) 5.5 Depth: (cm) 0.1 Area: (cm) 15.119 Volume: (cm) 1.512 % Reduction in Area: 18.6% % Reduction in Volume: 18.6% Wound Description Classification: Grade 2 Periwound Skin Texture Texture Color No Abnormalities Noted: No No Abnormalities Noted: No Moisture No Abnormalities Noted: No Debarge, Lurine N. ([id number]) Treatment Notes Wound #9 (Left Calcaneous) 1. Cleansed with: Clean wound with Normal Saline 4. Dressing Applied: Santyl Ointment 5. Secondary Dressing Applied Gauze and Kerlix/Conform 7. Secured with Tape Notes Teacher, adult education) Signed: 02/26/2016 5:20:12 PM By: Elliot Gurney, RN, [name], Kim RN, [name] Entered By: Elliot Gurney, RN, [name], Kim on 02/26/2016 16:08:30 [name] ([id number]) -------------------------------------------------------------------------------- Vitals Details [name] Date of Service: 02/26/2016 3:00 PM Patient Name: N. Patient Account Number: [id number] Medical Record Treating RN: Huel Coventry [id number] Number: Other Clinician: 1980/12/11 (35 y.o. Treating [name], [name] Date of Birth/Sex: Female) Physician/Extender: G Primary Care [name] Physician: Referring Physician: [name] in Treatment: 25 Vital Signs Time Taken: 16:10 Temperature (F): 97.9 Height (in): 68 Pulse (bpm): 72 Weight (lbs): 96 Respiratory Rate (breaths/min): 18 Body Mass Index  (BMI): 14.6 Blood Pressure (mmHg): 116/56 Reference Range: 80 - 120 mg / dl Electronic Signature(s) Signed: 02/26/2016 5:20:12 PM By: Elliot Gurney, RN, [name], Kim RN, [name] Entered By: Elliot Gurney, RN, [name], Kim on 02/26/2016 16:18:02

## 2016-03-11 ENCOUNTER — Encounter: Payer: Medicare Other | Attending: Internal Medicine | Admitting: Internal Medicine

## 2016-03-11 DIAGNOSIS — Z992 Dependence on renal dialysis: Secondary | ICD-10-CM | POA: Insufficient documentation

## 2016-03-11 DIAGNOSIS — E1022 Type 1 diabetes mellitus with diabetic chronic kidney disease: Secondary | ICD-10-CM | POA: Diagnosis not present

## 2016-03-11 DIAGNOSIS — Z681 Body mass index (BMI) 19 or less, adult: Secondary | ICD-10-CM | POA: Diagnosis not present

## 2016-03-11 DIAGNOSIS — E1052 Type 1 diabetes mellitus with diabetic peripheral angiopathy with gangrene: Secondary | ICD-10-CM | POA: Insufficient documentation

## 2016-03-11 DIAGNOSIS — L89622 Pressure ulcer of left heel, stage 2: Secondary | ICD-10-CM | POA: Insufficient documentation

## 2016-03-11 DIAGNOSIS — N186 End stage renal disease: Secondary | ICD-10-CM | POA: Insufficient documentation

## 2016-03-11 DIAGNOSIS — I70245 Atherosclerosis of native arteries of left leg with ulceration of other part of foot: Secondary | ICD-10-CM | POA: Insufficient documentation

## 2016-03-11 DIAGNOSIS — I70261 Atherosclerosis of native arteries of extremities with gangrene, right leg: Secondary | ICD-10-CM | POA: Insufficient documentation

## 2016-03-11 DIAGNOSIS — E43 Unspecified severe protein-calorie malnutrition: Secondary | ICD-10-CM | POA: Insufficient documentation

## 2016-03-11 DIAGNOSIS — F419 Anxiety disorder, unspecified: Secondary | ICD-10-CM | POA: Diagnosis not present

## 2016-03-11 DIAGNOSIS — Z89511 Acquired absence of right leg below knee: Secondary | ICD-10-CM | POA: Diagnosis not present

## 2016-03-11 DIAGNOSIS — M069 Rheumatoid arthritis, unspecified: Secondary | ICD-10-CM | POA: Diagnosis not present

## 2016-03-11 DIAGNOSIS — E10621 Type 1 diabetes mellitus with foot ulcer: Secondary | ICD-10-CM | POA: Diagnosis not present

## 2016-03-25 ENCOUNTER — Ambulatory Visit: Payer: Medicare Other | Admitting: Internal Medicine

## 2016-03-26 ENCOUNTER — Ambulatory Visit: Payer: Medicare Other | Admitting: Surgery

## 2016-03-30 ENCOUNTER — Ambulatory Visit: Payer: Medicare Other | Admitting: Surgery

## 2016-04-02 ENCOUNTER — Encounter: Payer: Medicare Other | Attending: Surgery | Admitting: Surgery

## 2016-04-02 DIAGNOSIS — L89622 Pressure ulcer of left heel, stage 2: Secondary | ICD-10-CM | POA: Diagnosis not present

## 2016-04-02 DIAGNOSIS — E1022 Type 1 diabetes mellitus with diabetic chronic kidney disease: Secondary | ICD-10-CM | POA: Diagnosis not present

## 2016-04-02 DIAGNOSIS — T87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, right lower extremity: Secondary | ICD-10-CM | POA: Insufficient documentation

## 2016-04-02 DIAGNOSIS — F419 Anxiety disorder, unspecified: Secondary | ICD-10-CM | POA: Diagnosis not present

## 2016-04-02 DIAGNOSIS — E43 Unspecified severe protein-calorie malnutrition: Secondary | ICD-10-CM | POA: Diagnosis not present

## 2016-04-02 DIAGNOSIS — Z992 Dependence on renal dialysis: Secondary | ICD-10-CM | POA: Insufficient documentation

## 2016-04-02 DIAGNOSIS — I70245 Atherosclerosis of native arteries of left leg with ulceration of other part of foot: Secondary | ICD-10-CM | POA: Insufficient documentation

## 2016-04-02 DIAGNOSIS — N186 End stage renal disease: Secondary | ICD-10-CM | POA: Diagnosis not present

## 2016-04-02 DIAGNOSIS — E10621 Type 1 diabetes mellitus with foot ulcer: Secondary | ICD-10-CM | POA: Insufficient documentation

## 2016-04-02 DIAGNOSIS — Y839 Surgical procedure, unspecified as the cause of abnormal reaction of the patient, or of later complication, without mention of misadventure at the time of the procedure: Secondary | ICD-10-CM | POA: Diagnosis not present

## 2016-04-02 DIAGNOSIS — Z681 Body mass index (BMI) 19 or less, adult: Secondary | ICD-10-CM | POA: Insufficient documentation

## 2016-04-02 DIAGNOSIS — M069 Rheumatoid arthritis, unspecified: Secondary | ICD-10-CM | POA: Diagnosis not present

## 2016-04-02 DIAGNOSIS — Z89511 Acquired absence of right leg below knee: Secondary | ICD-10-CM | POA: Diagnosis not present

## 2016-04-02 DIAGNOSIS — S71101A Unspecified open wound, right thigh, initial encounter: Secondary | ICD-10-CM | POA: Diagnosis not present

## 2016-04-02 DIAGNOSIS — E1052 Type 1 diabetes mellitus with diabetic peripheral angiopathy with gangrene: Secondary | ICD-10-CM | POA: Diagnosis not present

## 2016-04-02 DIAGNOSIS — I70261 Atherosclerosis of native arteries of extremities with gangrene, right leg: Secondary | ICD-10-CM | POA: Diagnosis not present

## 2016-04-02 NOTE — Progress Notes (Signed)
[name], [name] ([id number]) Visit Report for 03/11/2016 Chief Complaint Document Details LORRINA, BHAVSAR Date of Service: 03/11/2016 3:45 PM Patient Name: N. Patient Account Number: [id number] Medical Record Treating RN: Huel Coventry [id number] Number: Other Clinician: 1980-11-03 (34 y.o. Treating Milianna Ericsson Date of Birth/Sex: Female) Physician/Extender: G Primary Care [name] Physician: Referring Physician: [name] in Treatment: 27 Information Obtained from: Patient Chief Complaint Patient in today for treatment of non-healing wound and HBO Treatment. she has just gotten out of hospital this week and is back to resume her hyperbaric oxygen therapy Electronic Signature(s) Signed: 04/02/2016 7:33:08 AM By: Baltazar Najjar MD Entered By: Baltazar Najjar on 03/12/2016 78:29:56 [name] ([id number]) -------------------------------------------------------------------------------- HPI Details [name] Date of Service: 03/11/2016 3:45 PM Patient Name: N. Patient Account Number: [id number] Medical Record Treating RN: Huel Coventry [id number] Number: Other Clinician: 15-Oct-1980 (34 y.o. Treating Nisreen Guise Date of Birth/Sex: Female) Physician/Extender: G Primary Care [name] Physician: Referring Physician: [name] in Treatment: 27 History of Present Illness Location: dry gangrene both feet and heels Quality: Patient reports No Pain. Severity: Patient states wound are getting worse. Duration: Patient has had the wound for > 62months prior to seeking treatment at the wound center Context: The wound appeared gradually over time Modifying Factors: she has been in and out of hospital over the last 2 months Associated Signs and Symptoms: Patient reports having difficulty standing for long periods. HPI Description: [name] is a 35 y.o. female who presents to our wound center, back in June 2016, referred by her  PCP Dr. Zada Finders for nonhealing ulcers on the lateral aspect of the right heel. Of note she has a history of type 1 diabetes mellitus that has been uncontrolled. Past medical history significant for type 1 diabetes mellitus not controlled, ankylosing spondylitis, anorexia nervosa, irritable bowel syndrome, chronic kidney disease, chronic diarrhea. she then developed gangrene of both feet due to severe peripheral vascular disease and also had gangrene of the tips of her fingers due to upper extremity vascular disease. She was being worked up by vascular surgery at [facility] and at [facility] and has had several procedures done there. She started with hyperbaric oxygen therapy and had a total of 40 treatments the last one being on 06/20/2015. After the initial treatment of hyperbaric oxygen therapy she started having ear problems and had ultimately to use myringotomy tubes and this was done bilaterally. Since then her ears have been doing fine. In late September, she had seen vascular and hand surgeons. since then she's been in [place] at the rec center for surgery involving extensive vascular procedures for the upper extremities. She was then at [facility] with GI bleeds both upper and lower and has been in and out of hospital for that. She has recently been out of hospital for the last week. 09/09/2015 -- she was unable to get here in time to start her hyperbaric oxygen today and hence is only here for a wound care visit. 09/19/2015 -- she has been having vancomycin during her dialysis and continues to have vascular appointments and the procedure is been set for early January. She has been unable to make it for her hemodialysis due to various medical symptoms. 09/30/2014 -- her vancomycin was stopped on 09/25/2015 and the mother has noticed the right foot has started draining for the last 3 days. Addendum: after examining the patient I was able to talk to her primary vascular surgeon Dr. Pernell Dupre at  the [facility]. I told him  about the necrotic area on the plantar aspect of right foot which is now wet gangrene and he agreed with me that he would admit her at [facility] under her care and synchronize further treatment. We have discussed her poor prognosis and he and I discussed the need for hospice care [facility], [name] ([id number]) and for sitting down and talking to the patient and her mother and giving them a proper detail of the prognosis. 11/29/2014 -- She was admitted to the 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 She was worked up with MRIs, abdomen aortogram and placement of left-sided angioplasties were done. After a prolonged hospital she she was discharged home and was told to wear shrinker sock and stump protector and see her surgeon for further instructions regarding wound care and suture removal. Asked to take long-term doxycycline. 01/15/16; this is a patient I haven't seen before although she is been followed by Dr. Meyer Russel in this clinic today. She is a type I diabetic with severe PAD macrovascular disease. She has had a previous BKA. She has dry gangrene of the tips of her fingers which she showed me on the right to. She also has dry gangrene of the left first second and third toes and a portion of her proximal foot around these areas. She is followed by vascular surgery at [facility] and saw them recently they are not going to do surgery as of yet. She has a large black eschar over her heel which is beginning to separate in some areas. As I understand think she is paining these with Betadine. There is been some  suggestion about retrying hyperbarics on her although she is still not able to commit to the frequency of treatment that would be necessary to see improvement. She is also on Monday Wednesday Friday dialysis 01/21/16; the patient returns to see me today with regards to the left heel. Apparently she is not scheduled for any further attempts at revascularization of the left foot. 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 01/28/16; the patient returns today for attention to the left foot specifically the left calcaneal ulcer. This is covered in a thick black eschar. I crosshatched this last week and we have been using Santyl. 02/05/16; I continue to work on the thick black eschar on the patient's left calcaneus. She is using Santyl that this side crosshatched this area and the eschar is beginning to loosen. 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 This is beginning to separate and liquefy as well especially between the first and second toes. In the time being her major complaint is fatigue at dialysis 02/26/16 I continue to work on the patient's left calcaneus thick adherent black eschar. I crosshatched this area and we have been using Santyl although it is very adherent area I remove some nonviable tissue today what I can see of this actually looks surprisingly good. On the same foot she has dry gangrene on the first and second toes and part  of the forefoot underneath this. This is beginning to separate. 03/11/16; the patient comes in for her every 2 week appointment. I have been working on the black eschar on her heel. The patient apparently again has to come off dialysis early today after 2 hours due to severe complaints of nausea. She really does not look well Electronic  Signature(s) Signed: 04/02/2016 7:33:08 AM By: Baltazar Najjar MD Entered By: Baltazar Najjar on 03/12/2016 07:30:22 [name] ([id number]) -------------------------------------------------------------------------------- Physical Exam Details [name] Date of Service: 03/11/2016 3:45 PM Patient Name: N. Patient Account Number: [id number] Medical Record Treating RN: Huel Coventry [id number] Number: Other Clinician: [birth year] (34 y.o. Treating Giovanie Lefebre Date of Birth/Sex: Female) Physician/Extender: G Primary Care [name] Physician: Referring Physician: [name] in Treatment: 27 Constitutional Pulse regular and within target range for patient.Marland Kitchen Respirations regular, non-labored and within target range.. Temperature is normal and within the target range for the patient.. Patient looks somewhat gaunt and unwell. Respiratory Respiratory effort is easy and symmetric bilaterally. Rate is normal at rest and on room air.. Bilateral breath sounds are clear and equal in all lobes with no wheezes, rales or rhonchi.. Cardiovascular JVP is elevated she has coccyx edema as well. There are no pulses in the left foot however there is a lot of edema here also extending into the left leg. No evidence of a DVT or sialitis.. Gastrointestinal (GI) Abdomen is soft and non-distended without masses or tenderness. Bowel sounds active in all quadrants.. No liver or spleen enlargement or tenderness.. Integumentary (Hair, Skin) The patient has dry gangrene which is separating in the left foot as described below. I wonder if this has something to do with her sensation of systemic complaints.. Psychiatric The patient appears unwell. Somewhat cachectic, but not depressed. Notes Wound exam; the patient has dry gangrene involving her left first and second toes extending into the metatarsal heads. The gangrene is separating at the base of her toes. There is an odor. No  evidence of infection. The area over her heel on the left continues to show an eschar. This has slight separation. I have crosshatched this previously and we have been using Santyl with the hope of separating this. There is no evidence of infection around this either. Electronic Signature(s) Signed: 04/02/2016 7:33:08 AM By: Baltazar Najjar MD Entered By: Baltazar Najjar on 03/12/2016 07:36:29 [name] ([id number]) -------------------------------------------------------------------------------- Physician Orders Details [name] Date of Service: 03/11/2016 3:45 PM Patient Name: N. Patient Account Number: [id number] Medical Record Treating RN: Huel Coventry [id number] Number: Other Clinician: 20-Nov-1980 (34 y.o. Treating Antonin Meininger Date of Birth/Sex: Female) Physician/Extender: G Primary Care [name] Physician: Referring Physician: [name] in Treatment: 36 Verbal / Phone Orders: Yes Clinician: Huel Coventry Read Back and Verified: Yes Diagnosis Coding Wound Cleansing Wound #12 Left,Plantar,Circumferential Foot o Cleanse wound with mild soap and water Wound #9 Left Calcaneous o Cleanse wound with mild soap and water Primary Wound Dressing Wound #12 Left,Plantar,Circumferential Foot o Aquacel Ag - to wet areas in between the toes o Other: - betadine STICKS paint to dry areas ****PLEASE ORDER SOME FOR PT***** Wound #9 Left Calcaneous o Santyl Ointment Secondary Dressing Wound #12 Left,Plantar,Circumferential Foot o ABD pad o Gauze and Kerlix/Conform Wound #9 Left Calcaneous o ABD pad o Gauze and Kerlix/Conform Dressing Change Frequency Wound #12 Left,Plantar,Circumferential Foot o Change dressing every day. Wound #9 Left Calcaneous o Change dressing every day. PRESLY, STEINRUCK ([id number]) Follow-up Appointments Wound #12 Left,Plantar,Circumferential Foot o Return Appointment  in 2 weeks. o Other: - as  patient is able Wound #9 Left Calcaneous o Return Appointment in 2 weeks. o Other: - as patient is able Off-Loading Wound #12 Left,Plantar,Circumferential Foot o Open toe surgical shoe with peg assist. Wound #9 Left Calcaneous o Open toe surgical shoe with peg assist. Electronic Signature(s) Signed: 03/12/2016 11:41:21 AM By: Elliot Gurney RN, [name], Kim RN, [name] Signed: 04/02/2016 7:33:08 AM By: Baltazar Najjar MD Entered By: Elliot Gurney, RN, [name], Kim on 03/11/2016 16:42:36 [name], [name] ([id number]) -------------------------------------------------------------------------------- Problem List Details [name] Date of Service: 03/11/2016 3:45 PM Patient Name: N. Patient Account Number: [id number] Medical Record Treating RN: Huel Coventry [id number] Number: Other Clinician: 10-Feb-1981 (34 y.o. Treating Estrella Alcaraz Date of Birth/Sex: Female) Physician/Extender: G Primary Care [name] Physician: Referring Physician: [name] in Treatment: 27 Active Problems ICD-10 Encounter Code Description Active Date Diagnosis E10.621 Type 1 diabetes mellitus with foot ulcer 08/30/2015 Yes E10.52 Type 1 diabetes mellitus with diabetic peripheral 08/30/2015 Yes angiopathy with gangrene I70.245 Atherosclerosis of native arteries of left leg with ulceration 08/30/2015 Yes of other part of foot I70.261 Atherosclerosis of native arteries of extremities with 08/30/2015 Yes gangrene, right leg L89.622 Pressure ulcer of left heel, stage 2 08/30/2015 Yes Z89.511 Acquired absence of right leg below knee 11/29/2015 Yes Inactive Problems Resolved Problems ICD-10 Code Description Active Date Resolved Date L02.611 Cutaneous abscess of right foot 10/01/2015 10/01/2015 [name], [name] ([id number]) Electronic Signature(s) Signed: 04/02/2016 7:33:08 AM By: Baltazar Najjar MD Entered By: Baltazar Najjar on 03/12/2016 07:29:14 [name]  ([id number]) -------------------------------------------------------------------------------- Progress Note Details [name] Date of Service: 03/11/2016 3:45 PM Patient Name: N. Patient Account Number: [id number] Medical Record Treating RN: Huel Coventry [id number] Number: Other Clinician: July 08, 1981 (34 y.o. Treating Deslyn Cavenaugh Date of Birth/Sex: Female) Physician/Extender: G Primary Care [name] Physician: Referring Physician: [name] in Treatment: 27 Subjective Chief Complaint Information obtained from Patient Patient in today for treatment of non-healing wound and HBO Treatment. she has just gotten out of hospital this week and is back to resume her hyperbaric oxygen therapy History of Present Illness (HPI) The following HPI elements were documented for the patient's wound: Location: dry gangrene both feet and heels Quality: Patient reports No Pain. Severity: Patient states wound are getting worse. Duration: Patient has had the wound for > 80months prior to seeking treatment at the wound center Context: The wound appeared gradually over time Modifying Factors: she has been in and out of hospital over the last 2 months Associated Signs and Symptoms: Patient reports having difficulty standing for long periods. Aadya Kindler is a 35 y.o. female who presents to our wound center, back in June 2016, referred by her PCP Dr. Zada Finders for nonhealing ulcers on the lateral aspect of the right heel. Of note she has a history of type 1 diabetes mellitus that has been uncontrolled. Past medical history significant for type 1 diabetes mellitus not controlled, ankylosing spondylitis, anorexia nervosa, irritable bowel syndrome, chronic kidney disease, chronic diarrhea. she then developed gangrene of both feet due to severe peripheral vascular disease and also had gangrene of the tips of her fingers due to upper extremity vascular disease. She was being worked up  by vascular surgery at [facility] and at [facility] and has had several procedures done there. She started with hyperbaric oxygen therapy and had a total of 40 treatments the last one being on 06/20/2015. After the initial treatment of hyperbaric oxygen therapy she started having ear problems and had ultimately  to use myringotomy tubes and this was done bilaterally. Since then her ears have been doing fine. In late September, she had seen vascular and hand surgeons. since then she's been in [place] at the rec center for surgery involving extensive vascular procedures for the upper extremities. She was then at [facility] with GI bleeds both upper and lower and has been in and out of hospital for that. She has recently been out of hospital for the last week. 09/09/2015 -- she was unable to get here in time to start her hyperbaric oxygen today and hence is only here for a wound care visit. 09/19/2015 -- she has been having vancomycin during her dialysis and continues to have vascular Cattell, Arfa N. ([id number]) appointments and the procedure is been set for early January. She has been unable to make it for her hemodialysis due to various medical symptoms. 09/30/2014 -- her vancomycin was stopped on 09/25/2015 and the mother has noticed the right foot has started draining for the last 3 days. Addendum: after examining the patient I was able to talk to her primary vascular surgeon Dr. Pernell Dupre at the [facility]. I told him about the necrotic area on the plantar aspect of right foot which is now wet gangrene and he agreed with me that he would admit her at [facility] under her care and synchronize further treatment. We have discussed her poor prognosis and he and I discussed the need for hospice care and for sitting down and talking to the patient and her mother and giving them a proper detail of the prognosis. 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 She was worked up with MRIs, abdomen aortogram and placement of left-sided angioplasties were done. After a prolonged hospital she she was discharged home and was told to wear shrinker sock and stump protector and see her surgeon for further instructions regarding wound care and suture removal. Asked to take long-term doxycycline. 01/15/16; this is a patient I haven't seen before although she is been followed by Dr. Meyer Russel in this clinic today. She is a type I diabetic with severe PAD macrovascular disease. She has had a previous BKA. She has dry gangrene of the tips of her fingers which she showed me on the right to. She also has dry gangrene of the left first second and third toes and a portion of her proximal foot around these areas. She is followed by vascular surgery at [facility] and saw them recently they are not going to do surgery as of yet. She has a large black eschar over her heel which is beginning to separate in some areas. As I understand think she is paining these with Betadine. There is been some suggestion about retrying hyperbarics on her although she is still not able to commit to the frequency of treatment that would be necessary to see improvement. She is also on Monday Wednesday Friday dialysis 01/21/16; the patient returns to see me today with regards to the left heel. Apparently she is not scheduled for any further attempts at revascularization of the left foot. She has dry gangrene of the left medial foot, first and second toes  and there is already some separation developing here. 01/28/16; the patient returns today for attention to  the left foot specifically the left calcaneal ulcer. This is covered in a thick black eschar. I crosshatched this last week and we have been using Santyl. 02/05/16; I continue to work on the thick black eschar on the patient's left calcaneus. She is using Santyl that this side crosshatched this area and the eschar is beginning to loosen. 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 This is beginning to separate and liquefy as well especially between the first and second toes. In the time being her major complaint is fatigue at dialysis 02/26/16 I continue to work on the patient's left calcaneus thick adherent black eschar. I crosshatched this area and we have been using Santyl although it is very adherent area I remove some nonviable tissue today what I can see of this actually looks surprisingly good. On the same foot she has dry gangrene on the first and second toes and part of the forefoot underneath this. This is beginning to separate. 03/11/16; the patient comes in for her every 2 week appointment. I have been working on the black eschar on her heel. The patient apparently again has to come off dialysis early today after 2 hours due to severe complaints of nausea. She really does not look well [name], [name] ([id number]) Objective Constitutional Pulse regular and within target range for patient.Marland Kitchen Respirations regular, non-labored and within target range.. Temperature is normal and within the target range for the patient.. Patient looks somewhat gaunt and unwell. Vitals Time Taken: 4:23 PM, Height: 68 in, Weight: 96 lbs, BMI: 14.6, Temperature: 98.2 F, Pulse: 80 bpm, Respiratory Rate: 18 breaths/min. Gastrointestinal (GI) Abdomen is soft and non-distended without masses or tenderness. Bowel sounds active in all quadrants.. No liver or spleen enlargement or  tenderness.. General Notes: Wound exam; the patient has dry gangrene involving her left first and second toes extending into the metatarsal heads. The gangrene is separating at the base of her toes. There is an odor. No evidence of infection. The area over her heel on the left continues to show an eschar. This has slight separation. I have crosshatched this previously and we have been using Santyl with the hope of separating this. There is no evidence of infection around this either. Integumentary (Hair, Skin) The patient has dry gangrene which is separating in the left foot as described below. I wonder if this has something to do with her sensation of systemic complaints.. Wound #12 status is Open. Original cause of wound was Gradually Appeared. The wound is located on the Left,Plantar,Circumferential Foot. The wound measures 14cm length x 8.5cm width x 1cm depth; 93.462cm^2 area and 93.462cm^3 volume. The wound is limited to skin breakdown. There is a large amount of purulent drainage noted. The wound margin is well defined and not attached to the wound base. There is no granulation within the wound bed. There is a large (67-100%) amount of necrotic tissue within the wound bed including Eschar. The periwound skin appearance did not exhibit: Callus, Crepitus, Excoriation, Fluctuance, Friable, Induration, Localized Edema, Rash, Scarring, Dry/Scaly, Maceration, Moist, Atrophie Blanche, Cyanosis, Ecchymosis, Hemosiderin Staining, Mottled, Pallor, Rubor, Erythema. Wound #9 status is Open. Original cause of wound was Pressure Injury. The wound is located on the Left Calcaneous. The wound measures 4cm length x 5.5cm width  x 0.1cm depth; 17.279cm^2 area and 1.728cm^3 volume. Assessment Active Problems ICD-10 E10.621 - Type 1 diabetes mellitus with foot ulcer E10.52 - Type 1 diabetes mellitus with diabetic peripheral angiopathy with gangrene [name], [name] ([id number]) I70.245 -  Atherosclerosis of native arteries of left leg with ulceration of other part of foot I70.261 - Atherosclerosis of native arteries of extremities with gangrene, right leg W09.811 - Pressure ulcer of left heel, stage 2 Z89.511 - Acquired absence of right leg below knee Plan Wound Cleansing: Wound #12 Left,Plantar,Circumferential Foot: Cleanse wound with mild soap and water Wound #9 Left Calcaneous: Cleanse wound with mild soap and water Primary Wound Dressing: Wound #12 Left,Plantar,Circumferential Foot: Aquacel Ag - to wet areas in between the toes Other: - betadine STICKS paint to dry areas ****PLEASE ORDER SOME FOR PT***** Wound #9 Left Calcaneous: Santyl Ointment Secondary Dressing: Wound #12 Left,Plantar,Circumferential Foot: ABD pad Gauze and Kerlix/Conform Wound #9 Left Calcaneous: ABD pad Gauze and Kerlix/Conform Dressing Change Frequency: Wound #12 Left,Plantar,Circumferential Foot: Change dressing every day. Wound #9 Left Calcaneous: Change dressing every day. Follow-up Appointments: Wound #12 Left,Plantar,Circumferential Foot: Return Appointment in 2 weeks. Other: - as patient is able Wound #9 Left Calcaneous: Return Appointment in 2 weeks. Other: - as patient is able Off-Loading: Wound #12 Left,Plantar,Circumferential Foot: Open toe surgical shoe with peg assist. Wound #9 Left Calcaneous: Open toe surgical shoe with peg assist. [name], [name] ([id number]) The patient has an appointment with Dr. Pernell Dupre at [facility] who is her primary vascular interventional his physician. 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 I wonder whether this is causing some of her generalized complaints although she may not tolerate dialysis very well with systemic hypotension and/or she may have autonomic neuropathy. In any case I didn't think that further work on her left heel was indicated at this point until she starts feeling better. I would continue the Santyl for  now to the left heel. Silver alginate to the separating area on her left foot first and second toes Electronic Signature(s) Signed: 04/02/2016 7:33:08 AM By: Baltazar Najjar MD Entered By: Baltazar Najjar on 03/12/2016 07:34:54 [name] ([id number]) -------------------------------------------------------------------------------- SuperBill Details [name] Date of Service: 03/11/2016 Patient Name: N. Patient Account Number: [id number] Medical Record Treating RN: Huel Coventry [id number] Number: Other Clinician: Dec 19, 1980 (34 y.o. 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

## 2016-04-02 NOTE — Progress Notes (Signed)
[name], [name] ([id number]) Visit Report for 03/11/2016 Arrival Information Details [name] Date of Service: 03/11/2016 3:45 PM Patient Name: [name] Patient Account Number: [id number] Medical Record Treating RN: [name] [id number] Number: Other Clinician: 1980/12/22 (35 y.o. [name] Date of Birth/Sex: Female) Physician/Extender: G Primary Care Rolin Wu Physician: Referring Physician: [name] Wu in Treatment: 27 Visit Information History Since Last Visit Added or deleted any medications: No Patient Arrived: Wheel Chair Any new allergies or adverse reactions: No Arrival Time: 16:23 Had a fall or experienced change in No activities of daily living that may affect Accompanied By: mom risk of falls: Transfer Assistance: Manual Hospitalized since last visit: No Patient Identification Verified: Yes Has Dressing in Place as Prescribed: Yes Secondary Verification Process Yes Pain Present Now: No Completed: Patient Requires Transmission-Based No Precautions: Patient Has Alerts: Yes Electronic Signature(s) Signed: 03/12/2016 11:41:21 AM By: [name] Gurney, RN, [name], Kim RN, [name] Entered By: [name] Gurney, RN, [name], [name] on 03/11/2016 16:23:47 [name] ([id number]) -------------------------------------------------------------------------------- Clinic Level of Care Assessment Details [name] Date of Service: 03/11/2016 3:45 PM Patient Name: [name] Patient Account Number: [id number] Medical Record Treating RN: [name] [id number] Number: Other Clinician: Sep 17, 1981 (35 y.o. [name] Date of Birth/Sex: Female) Physician/Extender: G Primary Care Rolin Wu Physician: Referring Physician: [name] Wu in Treatment: 27 Clinic Level of Care Assessment Items TOOL 4 Quantity Score []  - Use when only an EandM is performed on FOLLOW-UP visit 0 ASSESSMENTS - Nursing Assessment / Reassessment []  - Reassessment of  Co-morbidities (includes updates in patient status) 0 X - Reassessment of Adherence to Treatment Plan 1 5 ASSESSMENTS - Wound and Skin Assessment / Reassessment X - Simple Wound Assessment / Reassessment - one wound 1 5 []  - Complex Wound Assessment / Reassessment - multiple wounds 0 []  - Dermatologic / Skin Assessment (not related to wound area) 0 ASSESSMENTS - Focused Assessment []  - Circumferential Edema Measurements - multi extremities 0 []  - Nutritional Assessment / Counseling / Intervention 0 []  - Lower Extremity Assessment (monofilament, tuning fork, pulses) 0 []  - Peripheral Arterial Disease Assessment (using hand held doppler) 0 ASSESSMENTS - Ostomy and/or Continence Assessment and Care []  - Incontinence Assessment and Management 0 []  - Ostomy Care Assessment and Management (repouching, etc.) 0 PROCESS - Coordination of Care X - Simple Patient / Family Education for ongoing care 1 15 []  - Complex (extensive) Patient / Family Education for ongoing care 0 []  - Staff obtains Consents, Records, Test Results / Process Orders 0 [name], [name] ([id number]) []  - Staff telephones HHA, Nursing Homes / Clarify orders / etc 0 []  - Routine Transfer to another Facility (non-emergent condition) 0 []  - Routine Hospital Admission (non-emergent condition) 0 []  - New Admissions / Manufacturing engineer / Ordering NPWT, Apligraf, etc. 0 []  - Emergency Hospital Admission (emergent condition) 0 X - Simple Discharge Coordination 1 10 []  - Complex (extensive) Discharge Coordination 0 PROCESS - Special Needs []  - Pediatric / Minor Patient Management 0 []  - Isolation Patient Management 0 []  - Hearing / Language / Visual special needs 0 []  - Assessment of Community assistance (transportation, D/C planning, etc.) 0 []  - Additional assistance / Altered mentation 0 []  - Support Surface(s) Assessment (bed, cushion, seat, etc.) 0 INTERVENTIONS - Wound Cleansing / Measurement X - Simple Wound  Cleansing - one wound 1 5 []  - Complex Wound Cleansing - multiple wounds 0 X - Wound Imaging (photographs - any number of wounds) 1 5 []  - Wound Tracing (  instead of photographs) 0 X - Simple Wound Measurement - one wound 1 5 []  - Complex Wound Measurement - multiple wounds 0 INTERVENTIONS - Wound Dressings []  - Small Wound Dressing one or multiple wounds 0 X - Medium Wound Dressing one or multiple wounds 1 15 []  - Large Wound Dressing one or multiple wounds 0 []  - Application of Medications - topical 0 [name], [name] ( ) []  - Application of Medications - injection 0 INTERVENTIONS - Miscellaneous []  - External ear exam 0 []  - Specimen Collection (cultures, biopsies, blood, body fluids, etc.) 0 []  - Specimen(s) / Culture(s) sent or taken to Lab for analysis 0 []  - Patient Transfer (multiple staff / / Similar devices) 0 []  - Simple Staple / Suture removal (25 or less) 0 []  - Complex Staple / Suture removal (26 or more) 0 []  - Hypo / Hyperglycemic Management (close monitor of Blood Glucose) 0 []  - Ankle / Brachial Index (ABI) - do not check if billed separately 0 X - Vital Signs 1 5 Has the patient been seen at the hospital within the last three years: Yes Total Score: 70 Level Of Care: New/Established - Level 2 Electronic Signature(s) Signed: 03/12/2016 11:41:21 AM By: , RN, [name], Kim RN, [name] Entered By: [id number], RN, [name], [name] on 03/11/2016 16:43:02 ( ) -------------------------------------------------------------------------------- Encounter Discharge Information Details Date of Service: 03/11/2016 3:45 PM Patient Name: [name] Patient Account Number: Nurse, adult Medical Record Treating RN:  Number: Other Clinician: [birth year] (35 y.o. [name] Date of Birth/Sex: Female) Physician/Extender: G Primary Care Physician: Referring Physician: 03/14/2016  in Treatment: 65 Encounter Discharge Information Items Discharge Pain Level: 0 Discharge Condition: Stable Ambulatory Status: Wheelchair Discharge Destination: Home Transportation: Private Auto Accompanied By: mom Schedule Follow-up Appointment: Yes Medication Reconciliation completed and provided to Patient/Care Yes [name]: Provided on Clinical Summary of Care: 03/11/2016 Form Type Recipient Paper Patient HB Electronic Signature(s) Signed: 03/12/2016 11:41:21 AM By: [name] Eves RN, [name], Kim RN, [name] Previous Signature: 03/11/2016 4:50:20 PM Version By: [name] Entered By: 03/13/2016 RN, [name], [name] on 03/11/2016 16:51:19 [name] ([id number]) -------------------------------------------------------------------------------- Lower Extremity Assessment Details 09/09/1981 Date of Service: 03/11/2016 3:45 PM Patient Name: [name] Patient Account Number: [name] Medical Record Treating RN: 36 03/13/2016 Number: Other Clinician: 1980-10-19 (34 y.o. [name] Date of Birth/Sex: Female) Physician/Extender: G Primary Care [name] Physician: Referring Physician: 03/13/2016 in Treatment: 27 Vascular Assessment Pulses: Posterior Tibial Dorsalis Pedis Palpable: [Left:Yes] Extremity colors, hair growth, and conditions: Extremity Color: [Left:Pale] Hair Growth on Extremity: [Left:Yes] Temperature of Extremity: [Left:Warm] Capillary Refill: [Left:> 3 seconds] Toe Nail Assessment Left: Right: Thick: No Discolored: Yes Deformed: No Improper Length and Hygiene: No Electronic Signature(s) Signed: 03/12/2016 11:41:21 AM By: 03/13/2016, RN, [name], Kim RN, [name] Entered By: [name] Eves, RN, [name], [name] on 03/11/2016 16:28:54 [name] (03/13/2016) -------------------------------------------------------------------------------- Multi Wound Chart Details [id number] Date of Service: 03/11/2016 3:45 PM Patient Name: [name] Patient Account  Number: [id number] Medical Record Treating RN: 09/09/1981 Rolin Wu Number: Other Clinician: [birth year] (34 y.o. 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 ([id number]) Pending Amputation on Yes No Wu/A Presentation: Measurements L x W x D 14x8.5x1 4x5.5x0.1 Wu/A (cm) Area (cm) : 93.462 17.279 Wu/A Volume (cm) : 93.462 1.728 Wu/A % Reduction in Area: -116.40% 7.00% Wu/A % Reduction in Volume: -2063.50% 6.90% Wu/A Classification: Grade 4 Grade 2 Wu/A Exudate Amount: Large Wu/A Wu/A Exudate Type: Purulent Wu/A Wu/A Exudate Color: yellow, brown, green Wu/A Wu/A Foul Odor After Yes Wu/A Wu/A Cleansing: Odor Anticipated Due to No Wu/A Wu/A Product Use: Wound Margin: Well defined, not attached Wu/A Wu/A Granulation Amount: None Present (0%) Wu/A Wu/A Necrotic Amount: Large (67-100%) Wu/A Wu/A Necrotic Tissue: Eschar Wu/A Wu/A Exposed Structures: Fascia: No Wu/A Wu/A Fat: No Tendon: No Muscle: No Joint: No Bone: No Limited to Skin Breakdown Periwound Skin Texture: Edema: No No Abnormalities Noted Wu/A Excoriation: No Induration: No Callus: No Crepitus: No Fluctuance: No Friable: No Rash: No Scarring:  No Periwound Skin Maceration: No No Abnormalities Noted Wu/A Moisture: Moist: No Dry/Scaly: No Periwound Skin Color: Atrophie Blanche: No No Abnormalities Noted Wu/A Cyanosis: No Ecchymosis: No Erythema: No Hemosiderin Staining: No Mottled: No Pallor: No Rubor: No Tenderness on No No Wu/A Palpation: [name], [name] Kitchen ([id number]) Wound Preparation: Ulcer Cleansing: Wu/A Wu/A Rinsed/Irrigated with Saline Topical Anesthetic Applied: None Treatment Notes Electronic Signature(s) Signed: 03/12/2016 11:41:21 AM By: [name] Gurney, RN, [name], Kim RN, [name] Entered By: [name] Gurney, RN, [name], [name] on 03/11/2016 16:33:52 [name] ([id number]) -------------------------------------------------------------------------------- Multi-Disciplinary Care Plan Details [name] Date of Service: 03/11/2016 3:45 PM Patient Name: [name] Patient Account Number: [id number] Medical Record Treating RN: [name] [id number] Number: Other Clinician: [birth year] (34 y.o. [name] Date of Birth/Sex: Female) Physician/Extender: G Primary Care Rolin Wu Physician: Referring Physician: [name] Wu in Treatment: 27 Active Inactive HBO Nursing Diagnoses: Anxiety related to feelings of confinement associated with the hyperbaric oxygen chamber Anxiety related to knowledge deficit of hyperbaric oxygen therapy and treatment procedures Discomfort related to temperature and humidity changes inside hyperbaric chamber Potential for barotraumas to ears, sinuses, teeth, and lungs or cerebral gas embolism related to changes in atmospheric pressure inside hyperbaric oxygen chamber Potential for oxygen toxicity seizures related to delivery of 100% oxygen at an increased atmospheric pressure Potential for pulmonary oxygen toxicity related to delivery of 100% oxygen at an increased atmospheric pressure Goals: Barotrauma will be prevented during HBO2 Date Initiated: 08/30/2015 Goal Status:  Active Patient and/or family will be able to state/discuss factors appropriate to the management of their disease process during treatment Date Initiated: 08/30/2015 Goal Status: Active Patient will tolerate the hyperbaric oxygen therapy treatment Date Initiated: 08/30/2015 Goal Status: Active Patient will tolerate the internal climate of the chamber Date Initiated: 08/30/2015 Goal Status: Active Patient/caregiver will verbalize understanding of HBO goals, rationale, procedures and potential hazards Date Initiated: 08/30/2015 Goal Status: Active Signs and symptoms of pulmonary oxygen toxicity will be recognized and promptly addressed Date Initiated: 08/30/2015 [name], [name] ([id number]) Goal Status: Active Signs and symptoms of seizure will be recognized and promptly addressed ; seizing patients will suffer no harm Date Initiated: 08/30/2015 Goal Status: Active Interventions: Administer a five (5) minute air break for patient if signs and symptoms of seizure appear and notify the hyperbaric physician Administer a ten (10) minute air break for patient  if signs and symptoms of seizure appear and notify the hyperbaric physician Administer decongestants, per physician orders, prior to HBO2 Administer the correct therapeutic gas delivery based on the patients needs and limitations, per physician order Assess and provide for patientos comfort related to the hyperbaric environment and equalization of middle ear Assess for signs and symptoms related to adverse events, including but not limited to confinement anxiety, pneumothorax, oxygen toxicity and baurotrauma Assess patient for any history of confinement anxiety Assess patient's knowledge and expectations regarding hyperbaric medicine and provide education related to the hyperbaric environment, goals of treatment and prevention of adverse events Implement protocols to decrease risk of pneumothorax in high risk patients Notes: Abuse  / Safety / Falls / Self Care Management Nursing Diagnoses: Potential for falls Goals: Patient will remain injury free Date Initiated: 09/19/2015 Goal Status: Active Patient/caregiver will verbalize/demonstrate measures taken to prevent injury and/or falls Date Initiated: 09/19/2015 Goal Status: Active Interventions: Assess fall risk on admission and as needed Assess impairment of mobility on admission and as needed per policy Notes: Orientation to the Wound Care Program Nursing Diagnoses: [name], [name] ([id number]) Knowledge deficit related to the wound healing center program Goals: Patient/caregiver will verbalize understanding of the Wound Healing Center Program Date Initiated: 08/30/2015 Goal Status: Active Interventions: Provide education on orientation to the wound center Notes: Pressure Nursing Diagnoses: Knowledge deficit related to causes and risk factors for pressure ulcer development Knowledge deficit related to management of pressures ulcers Potential for impaired tissue integrity related to pressure, friction, moisture, and shear Goals: Patient will remain free from development of additional pressure ulcers Date Initiated: 08/30/2015 Goal Status: Active Patient will remain free of pressure ulcers Date Initiated: 08/30/2015 Goal Status: Active Patient/caregiver will verbalize risk factors for pressure ulcer development Date Initiated: 08/30/2015 Goal Status: Active Patient/caregiver will verbalize understanding of pressure ulcer management Date Initiated: 08/30/2015 Goal Status: Active Interventions: Assess: immobility, friction, shearing, incontinence upon admission and as needed Assess offloading mechanisms upon admission and as needed Assess potential for pressure ulcer upon admission and as needed Provide education on pressure ulcers Treatment Activities: Patient referred for home evaluation of offloading devices/mattresses : 01/15/2016 Patient  referred for pressure reduction/relief devices : 01/15/2016 Pressure reduction/relief device ordered : 01/15/2016 Notes: [name], [name] ([id number]) Wound/Skin Impairment Nursing Diagnoses: Impaired tissue integrity Knowledge deficit related to ulceration/compromised skin integrity Goals: Patient will have a decrease in wound volume by X% from date: (specify in notes) Date Initiated: 08/30/2015 Goal Status: Active Patient/caregiver will verbalize understanding of skin care regimen Date Initiated: 08/30/2015 Goal Status: Active Ulcer/skin breakdown will have a volume reduction of 30% by week 4 Date Initiated: 08/30/2015 Goal Status: Active Ulcer/skin breakdown will have a volume reduction of 50% by week 8 Date Initiated: 08/30/2015 Goal Status: Active Ulcer/skin breakdown will have a volume reduction of 80% by week 12 Date Initiated: 08/30/2015 Goal Status: Active Ulcer/skin breakdown will heal within 14 weeks Date Initiated: 08/30/2015 Goal Status: Active Interventions: Assess patient/caregiver ability to obtain necessary supplies Assess patient/caregiver ability to perform ulcer/skin care regimen upon admission and as needed Assess ulceration(s) every visit Provide education on ulcer and skin care Notes: Electronic Signature(s) Signed: 03/12/2016 11:41:21 AM By: [name] Gurney, RN, [name], Kim RN, [name] Entered By: [name] Gurney, RN, [name], [name] on 03/11/2016 16:33:43 [name] ([id number]) -------------------------------------------------------------------------------- Pain Assessment Details [name] Date of Service: 03/11/2016 3:45 PM Patient Name: [name] Patient Account Number: [id number] Medical Record Treating RN: [name] [id number] Number: Other Clinician: 06-05-1981 (34 y.o. [name]  Date of Birth/Sex: Female) Physician/Extender: G Primary Care Rolin Wu Physician: Referring Physician: [name] Wu in Treatment: 27 Active  Problems Location of Pain Severity and Description of Pain Patient Has Paino No Site Locations With Dressing Change: No Pain Management and Medication Current Pain Management: Electronic Signature(s) Signed: 03/12/2016 11:41:21 AM By: [name] Gurney, RN, [name], Kim RN, [name] Entered By: [name] Gurney, RN, [name], [name] on 03/11/2016 16:23:55 [name] ([id number]) -------------------------------------------------------------------------------- Patient/Caregiver Education Details [name] Date of Service: 03/11/2016 3:45 PM Patient Name: [name] Patient Account Number: [id number] Medical Record Treating RN: [name] [id number] Number: Other Clinician: 21-Sep-1981 (34 y.o. [name] Date of Birth/Gender: Female) Physician/Extender: G Primary Care Weeks in Treatment: 9 Rolin Wu Physician: Referring Physician: Rolin Wu Education Assessment Education Provided To: Patient Education Topics Provided Wound/Skin Impairment: Handouts: Caring for Your Ulcer, Other: wound care as prescribed Methods: Demonstration, Explain/Verbal Responses: State content correctly Electronic Signature(s) Signed: 03/12/2016 11:41:21 AM By: [name] Gurney, RN, [name], Kim RN, [name] Entered By: [name] Gurney, RN, [name], [name] on 03/11/2016 16:51:42 [name] ([id number]) -------------------------------------------------------------------------------- Wound Assessment Details [name] Date of Service: 03/11/2016 3:45 PM Patient Name: [name] Patient Account Number: [id number] Medical Record Treating RN: [name] [id number] Number: Other Clinician: Apr 06, 1981 (34 y.o. [name] Date of Birth/Sex: Female) Physician/Extender: G Primary Care Rolin Wu Physician: Referring Physician: [name] Wu in Treatment: 27 Wound Status Wound Number: 12 Primary Diabetic Wound/Ulcer of the Lower Etiology: Extremity Wound Location: Left Foot - Plantar, Circumfernential Wound  Open Status: Wounding Event: Gradually Appeared Comorbid Anemia, Type I Diabetes, End Stage Date Acquired: 07/28/2015 History: Renal Disease, Rheumatoid Arthritis, Weeks Of Treatment: 27 Neuropathy Clustered Wound: No Pending Amputation On Presentation Photos Wound Measurements Length: (cm) 14 Width: (cm) 8.5 Depth: (cm) 1 Area: (cm) 93.462 Volume: (cm) 93.462 % Reduction in Area: -116.4% % Reduction in Volume: -2063.5% Wound Description Classification: Grade 4 Foul Odor After Wound Margin: Well defined, not attached Due to Product Exudate Amount: Large Exudate Type: Purulent Exudate Color: yellow, brown, green Cleansing: Yes Use: No Wound Bed [place], [name] ([id number]) Granulation Amount: None Present (0%) Exposed Structure Necrotic Amount: Large (67-100%) Fascia Exposed: No Necrotic Quality: Eschar Fat Layer Exposed: No Tendon Exposed: No Muscle Exposed: No Joint Exposed: No Bone Exposed: No Limited to Skin Breakdown Periwound Skin Texture Texture Color No Abnormalities Noted: No No Abnormalities Noted: No Callus: No Atrophie Blanche: No Crepitus: No Cyanosis: No Excoriation: No Ecchymosis: No Fluctuance: No Erythema: No Friable: No Hemosiderin Staining: No Induration: No Mottled: No Localized Edema: No Pallor: No Rash: No Rubor: No Scarring: No Moisture No Abnormalities Noted: No Dry / Scaly: No Maceration: No Moist: No Wound Preparation Ulcer Cleansing: Rinsed/Irrigated with Saline Topical Anesthetic Applied: None Treatment Notes Wound #12 (Left, Plantar, Circumferential Foot) 1. Cleansed with: Clean wound with Normal Saline 4. Dressing Applied: Aquacel Ag 5. Secondary Dressing Applied ABD and Kerlix/Conform Notes betadine paint and stretch netting Electronic Signature(s) Signed: 03/12/2016 11:41:21 AM By: [name] Gurney, RN, [name], Kim RN, [name] Entered By: [name] Gurney, RN, [name], [name] on 03/11/2016 16:32:10 [name], [name]  ([id number]) [name], [name] ([id number]) -------------------------------------------------------------------------------- Wound Assessment Details [name] Date of Service: 03/11/2016 3:45 PM Patient Name: [name] Patient Account Number: [id number] Medical Record Treating RN: [name] [id number] Number: Other Clinician: 1981-03-20 (34 y.o. [name] Date of Birth/Sex: Female) Physician/Extender: G Primary Care Rolin Wu Physician: Referring Physician: [name] Wu in Treatment: 27 Wound Status Wound Number: 9 Primary Diabetic Wound/Ulcer of the Lower Etiology: Extremity Wound  Location: Left Calcaneous Wound Status: Open Wounding Event: Pressure Injury Date Acquired: 07/28/2015 Weeks Of Treatment: 27 Clustered Wound: No Photos Photo Uploaded By: [name] Gurney, RN, [name], [name] on 03/11/2016 16:32:53 Wound Measurements Length: (cm) 4 Width: (cm) 5.5 Depth: (cm) 0.1 Area: (cm) 17.279 Volume: (cm) 1.728 % Reduction in Area: 7% % Reduction in Volume: 6.9% Wound Description Classification: Grade 2 Periwound Skin Texture Texture Color No Abnormalities Noted: No No Abnormalities Noted: No Moisture No Abnormalities Noted: No [name], [name] ([id number]) Treatment Notes Wound #9 (Left Calcaneous) 1. Cleansed with: Clean wound with Normal Saline 4. Dressing Applied: Santyl Ointment 5. Secondary Dressing Applied ABD and Kerlix/Conform 7. Secured with Tape Notes stretch netting Electronic Signature(s) Signed: 03/12/2016 11:41:21 AM By: [name] Gurney, RN, [name], Kim RN, [name] Entered By: [name] Gurney, RN, [name], [name] on 03/11/2016 16:29:55 [name] ([id number]) -------------------------------------------------------------------------------- Vitals Details [name] Date of Service: 03/11/2016 3:45 PM Patient Name: [name] Patient Account Number: [id number] Medical Record Treating RN: [name] [id number] Number: Other  Clinician: [birth year] (34 y.o. [name] Date of Birth/Sex: Female) Physician/Extender: G Primary Care Rolin Wu Physician: Referring Physician: [name] Wu in Treatment: 27 Vital Signs Time Taken: 16:23 Temperature (F): 98.2 Height (in): 68 Pulse (bpm): 80 Weight (lbs): 96 Respiratory Rate (breaths/min): 18 Body Mass Index (BMI): 14.6 Reference Range: 80 - 120 mg / dl Electronic Signature(s) Signed: 03/12/2016 11:41:21 AM By: [name] Gurney, RN, [name], Kim RN, [name] Entered By: [name] Gurney, RN, [name], [name] on 03/11/2016 16:24:13

## 2016-04-03 NOTE — Progress Notes (Signed)
[name] ([id number]) Visit Report for 04/02/2016 Arrival Information Details Patient Name: [name], [name] Date of Service: 04/02/2016 3:30 PM Medical Record Number: [id number] Patient Account Number: [id number] Date of Birth/Sex: June 16, 1981 (34 y.o. Female) Treating RN: [name], [name] Primary Care Physician: [name] Other Clinician: Referring Physician: Rolin Wu Treating Physician/Extender: [name] in Treatment: 30 Visit Information History Since Last Visit All ordered tests and consults were completed: No Patient Arrived: Wheel Chair Added or deleted any medications: No Arrival Time: 15:54 Any new allergies or adverse reactions: No Accompanied By: mother Had a fall or experienced change in No Transfer Assistance: [employer] activities of daily living that may affect Patient Lift risk of falls: Patient Identification Verified: Yes Signs or symptoms of abuse/neglect since last No Secondary Verification Process Yes visito Completed: Hospitalized since last visit: No Patient Requires Transmission- No Pain Present Now: No Based Precautions: Patient Has Alerts: Yes Electronic Signature(s) Signed: 04/02/2016 4:09:14 PM By: [name] Gurney, RN, Wu, [name] Entered By: [name] Gurney, RN, Wu, [name] 16:07:51 [name] ([id number]) -------------------------------------------------------------------------------- Encounter Discharge Information Details Patient Name: [name] Date of Service: 04/02/2016 3:30 PM Medical Record Number: [id number] Patient Account Number: [id number] Date of Birth/Sex: Mar 06, 1981 (34 y.o. Female) Treating RN: [name], [name] Primary Care Physician: [name] Other Clinician: Referring Physician: Rolin Wu Treating Physician/Extender: [name] in Treatment: 30 Encounter Discharge Information Items Discharge Pain Level: 0 Discharge Condition: Stable Ambulatory Status:  Wheelchair Discharge Destination: Home Transportation: Private Auto Accompanied By: mother Schedule Follow-up Appointment: Yes Medication Reconciliation completed Yes and provided to Patient/Care [name]: Patient Clinical Summary of Care: Declined Electronic Signature(s) Signed: 04/02/2016 5:10:15 PM By: [name] Entered By: [name] on Wu 17:10:15 [name] ([id number]) -------------------------------------------------------------------------------- Lower Extremity Assessment Details Patient Name: [name] Date of Service: 04/02/2016 3:30 PM Medical Record Number: [id number] Patient Account Number: [id number] Date of Birth/Sex: 19-Jul-1981 (34 y.o. Female) Treating RN: [name], [name] Primary Care Physician: [name] Other Clinician: Referring Physician: Rolin Wu Treating Physician/Extender: [name] in Treatment: 30 Vascular Assessment Pulses: Posterior Tibial Dorsalis Pedis Palpable: [Left:Yes] Extremity colors, hair growth, and conditions: Extremity Color: [Left:Pale] Temperature of Extremity: [Left:Warm] Electronic Signature(s) Signed: 04/02/2016 4:09:14 PM By: [name] Gurney RN, Wu, [name] Signed: 04/02/2016 5:50:37 PM By: [name] Entered By: [name] Gurney RN, Wu, [name] 16:08:15 [name] ([id number]) -------------------------------------------------------------------------------- Multi Wound Chart Details Patient Name: [name] Date of Service: 04/02/2016 3:30 PM Medical Record Number: [id number] Patient Account Number: [id number] Date of Birth/Sex: [birth year] (34 y.o. 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] [16:Below Knee - Posterior] Wounding Event: [12:Gradually Appeared] [15:Pressure Injury] [16:Pressure Injury] Primary 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] [16:Pressure Ulcer] Comorbid History: [12:Anemia, Type I Diabetes, Anemia, Type I Diabetes, Anemia, Type I Diabetes, End Stage Renal Disease, End Stage Renal Disease, End Stage Renal Disease, Rheumatoid Arthritis, Neuropathy] [15:Rheumatoid Arthritis, Neuropathy]  [16:Rheumatoid Arthritis, Neuropathy] Date Acquired: [12:07/28/2015] [15:04/02/2016] [16:04/02/2016] Weeks of Treatment: [12:30] [15:0] [16:0] Wound Status: [12:Open] [15:Open] [16:Open] Pending Amputation on Yes [15:No] [16:No] Presentation: Measurements L x W x  D 14x8.5x1 [15:0.6x1.1x0.1] [16:2.7x2.8x0.1] (cm) Area (cm) : [12:93.462] [15:0.518] [16:5.938] Volume (cm) : [12:93.462] [15:0.052] [16:0.594] % Reduction in Area: [12:-116.40%] [15:N/A] [16:N/A] % Reduction in Volume: -2063.50% [15:N/A] [16:N/A] Classification: [12:Grade 4] [15:Category/Stage II] [16:Category/Stage II] HBO Classification: [12:N/A] [15:Grade 1] [16:Grade 1] Exudate Amount: [12:Large] [15:Large] [16:Large] Exudate Type: [12:Purulent] [15:Serosanguineous] [16:Serosanguineous] Exudate Color: [12:yellow, brown, green] [15:red, brown] [16:red, brown] Foul Odor After [12:Yes] [15:No] [16:No] Cleansing: Odor Anticipated Due to No [15:N/A] [16:N/A] Product Use: [name] ([id number]) Wound Margin: Well defined, not attached Flat and Intact Distinct, outline attached Granulation Amount: None Present (0%) None Present (0%) Medium (34-66%) Granulation Quality: N/A N/A Red, Pink Necrotic Amount: Large (67-100%)  Large (67-100%) Medium (34-66%) Necrotic Tissue: Eschar Eschar Eschar, Adherent Slough Exposed Structures: Fascia: No N/A N/A Fat: No Tendon: No Muscle: No Joint: No Bone: No Limited to Skin Breakdown Epithelialization: None None None Periwound Skin Texture: Edema: No No Abnormalities Noted Edema: Yes Excoriation: No Induration: No Callus: No Crepitus: No Fluctuance: No Friable: No Rash: No Scarring: No Periwound Skin Maceration: No Moist: Yes No Abnormalities Noted Moisture: Moist: No Dry/Scaly: No Periwound Skin Color: Atrophie Blanche: No No Abnormalities Noted Erythema: Yes Cyanosis: No Ecchymosis: No Erythema: No Hemosiderin Staining: No Mottled: No Pallor: No Rubor: No Erythema Location: N/A N/A Circumferential Temperature: N/A No Abnormality No Abnormality Tenderness on No Yes Yes Palpation: Wound Preparation: Ulcer Cleansing: Ulcer Cleansing: Ulcer Cleansing: Rinsed/Irrigated with Rinsed/Irrigated with Rinsed/Irrigated with Saline Saline Saline Topical Anesthetic Topical Anesthetic Topical Anesthetic Applied: None Applied: Other: lidcaine Applied: Other: lidocaine 4% 4% Wound Number: 9 N/A N/A Photos: No Photos N/A N/A Wound Location: Left Calcaneous N/A N/A Wounding Event: Pressure Injury N/A N/A NYLE, BLAKENSHIP ([id number]) Primary Etiology: 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 Comorbid History: Anemia, Type I Diabetes, N/A N/A End Stage Renal Disease, Rheumatoid Arthritis, Neuropathy Date Acquired: 07/28/2015 N/A N/A Weeks of Treatment: 30 N/A N/A Wound Status: Open N/A N/A Pending Amputation on No N/A N/A Presentation: Measurements L x W x D 4x5.5x0.1 N/A N/A (cm) Area (cm) : 17.279 N/A N/A Volume (cm) : 1.728 N/A N/A % Reduction in Area: 7.00% N/A N/A % Reduction in Volume: 6.90% N/A N/A Classification: Grade 2 N/A N/A HBO Classification: N/A N/A N/A Exudate Amount: None Present N/A N/A Exudate Type: N/A N/A  N/A Exudate Color: N/A N/A N/A Foul Odor After No N/A N/A Cleansing: Odor Anticipated Due to N/A N/A N/A Product Use: Wound Margin: N/A N/A N/A Granulation Amount: None Present (0%) N/A N/A Granulation Quality: N/A N/A N/A Necrotic Amount: Large (67-100%) N/A N/A Necrotic Tissue: Eschar N/A N/A Exposed Structures: N/A N/A N/A Epithelialization: None N/A N/A Periwound Skin Texture: No Abnormalities Noted N/A N/A Periwound Skin No Abnormalities Noted N/A N/A Moisture: Periwound Skin Color: No Abnormalities Noted N/A N/A Erythema Location: N/A N/A N/A Temperature: N/A N/A N/A Tenderness on No N/A N/A Palpation: Wound Preparation: Ulcer Cleansing: N/A N/A Rinsed/Irrigated with Saline Topical Anesthetic Applied: None Treatment Notes ARLISS, KOEP ([id number]) Electronic Signature(s) Signed: 04/02/2016 5:50:37 PM By: [name] Entered By: [name] on Wu 16:23:18 [name] ([id number]) -------------------------------------------------------------------------------- Multi-Disciplinary Care Plan Details Patient Name: [name] Date of Service: 04/02/2016 3:30 PM Medical Record Number: [id number] Patient Account Number: [id number] Date of Birth/Sex: Sep 05, 1981 (34 y.o. Female) Treating RN: Phillis Haggis Primary Care Physician: [name] Other Clinician: Referring Physician: Rolin Wu Treating Physician/Extender: [name] in Treatment: 30 Active Inactive HBO Nursing Diagnoses: Anxiety related to feelings of  confinement associated with the hyperbaric oxygen chamber Anxiety related to knowledge deficit of hyperbaric oxygen therapy and treatment procedures Discomfort related to temperature and humidity changes inside hyperbaric chamber Potential for barotraumas to ears, sinuses, teeth, and lungs or cerebral gas embolism related to changes in atmospheric pressure inside hyperbaric oxygen chamber Potential for  oxygen toxicity seizures related to delivery of 100% oxygen at an increased atmospheric pressure Potential for pulmonary oxygen toxicity related to delivery of 100% oxygen at an increased atmospheric pressure Goals: Barotrauma will be prevented during HBO2 Date Initiated: 08/30/2015 Goal Status: Active Patient and/or family will be able to state/discuss factors appropriate to the management of their disease process during treatment Date Initiated: 08/30/2015 Goal Status: Active Patient will tolerate the hyperbaric oxygen therapy treatment Date Initiated: 08/30/2015 Goal Status: Active Patient will tolerate the internal climate of the chamber Date Initiated: 08/30/2015 Goal Status: Active Patient/caregiver will verbalize understanding of HBO goals, rationale, procedures and potential hazards Date Initiated: 08/30/2015 Goal Status: Active Signs and symptoms of pulmonary oxygen toxicity will be recognized and promptly addressed Date Initiated: 08/30/2015 Goal Status: Active Signs and symptoms of seizure will be recognized and promptly addressed ; seizing patients will suffer no harm Date Initiated: 08/30/2015 [name] ([id number]) Goal Status: Active Interventions: Administer a five (5) minute air break for patient if signs and symptoms of seizure appear and notify the hyperbaric physician Administer a ten (10) minute air break for patient if signs and symptoms of seizure appear and notify the hyperbaric physician Administer decongestants, per physician orders, prior to HBO2 Administer the correct therapeutic gas delivery based on the patients needs and limitations, per physician order Assess and provide for patientos comfort related to the hyperbaric environment and equalization of middle ear Assess for signs and symptoms related to adverse events, including but not limited to confinement anxiety, pneumothorax, oxygen toxicity and baurotrauma Assess patient for any history  of confinement anxiety Assess patient's knowledge and expectations regarding hyperbaric medicine and provide education related to the hyperbaric environment, goals of treatment and prevention of adverse events Implement protocols to decrease risk of pneumothorax in high risk patients Notes: Abuse / Safety / Falls / Self Care Management Nursing Diagnoses: Potential for falls Goals: Patient will remain injury free Date Initiated: 09/19/2015 Goal Status: Active Patient/caregiver will verbalize/demonstrate measures taken to prevent injury and/or falls Date Initiated: 09/19/2015 Goal Status: Active Interventions: Assess fall risk on admission and as needed Assess impairment of mobility on admission and as needed per policy Notes: Orientation to the Wound Care Program Nursing Diagnoses: Knowledge deficit related to the wound healing center program Goals: Patient/caregiver will verbalize understanding of the Wound Healing Center Program [place] ([id number]) Date Initiated: 08/30/2015 Goal Status: Active Interventions: Provide education on orientation to the wound center Notes: Pressure Nursing Diagnoses: Knowledge deficit related to causes and risk factors for pressure ulcer development Knowledge deficit related to management of pressures ulcers Potential for impaired tissue integrity related to pressure, friction, moisture, and shear Goals: Patient will remain free from development of additional pressure ulcers Date Initiated: 08/30/2015 Goal Status: Active Patient will remain free of pressure ulcers Date Initiated: 08/30/2015 Goal Status: Active Patient/caregiver will verbalize risk factors for pressure ulcer development Date Initiated: 08/30/2015 Goal Status: Active Patient/caregiver will verbalize understanding of pressure ulcer management Date Initiated: 08/30/2015 Goal Status: Active Interventions: Assess: immobility, friction, shearing, incontinence upon  admission and as needed Assess offloading mechanisms upon admission and as needed Assess potential for pressure ulcer upon admission and as  needed Provide education on pressure ulcers Treatment Activities: Patient referred for home evaluation of offloading devices/mattresses : 01/15/2016 Patient referred for pressure reduction/relief devices : 01/15/2016 Pressure reduction/relief device ordered : 01/15/2016 Notes: Wound/Skin Impairment Nursing Diagnoses: Impaired tissue integrity Eckels, Yarden N. ([id number]) Knowledge deficit related to ulceration/compromised skin integrity Goals: Patient will have a decrease in wound volume by X% from date: (specify in notes) Date Initiated: 08/30/2015 Goal Status: Active Patient/caregiver will verbalize understanding of skin care regimen Date Initiated: 08/30/2015 Goal Status: Active Ulcer/skin breakdown will have a volume reduction of 30% by week 4 Date Initiated: 08/30/2015 Goal Status: Active Ulcer/skin breakdown will have a volume reduction of 50% by week 8 Date Initiated: 08/30/2015 Goal Status: Active Ulcer/skin breakdown will have a volume reduction of 80% by week 12 Date Initiated: 08/30/2015 Goal Status: Active Ulcer/skin breakdown will heal within 14 weeks Date Initiated: 08/30/2015 Goal Status: Active Interventions: Assess patient/caregiver ability to obtain necessary supplies Assess patient/caregiver ability to perform ulcer/skin care regimen upon admission and as needed Assess ulceration(s) every visit Provide education on ulcer and skin care Notes: Electronic Signature(s) Signed: 04/02/2016 5:50:37 PM By: [name] Entered By: [name] on Wu 16:23:11 [name] ([id number]) -------------------------------------------------------------------------------- Pain Assessment Details Patient Name: [name] Date of Service: 04/02/2016 3:30 PM Medical Record Number: [id number] Patient Account  Number: [id number] Date of Birth/Sex: Apr 11, 1981 (34 y.o. Female) Treating RN: [name], [name] Primary Care Physician: [name] Other Clinician: Referring Physician: Rolin Wu Treating Physician/Extender: [name] in Treatment: 30 Active Problems Location of Pain Severity and Description of Pain Patient Has Paino No Site Locations With Dressing Change: No Pain Management and Medication Current Pain Management: Electronic Signature(s) Signed: 04/02/2016 4:09:14 PM By: [name] Gurney RN, Wu, [name] Signed: 04/02/2016 5:50:37 PM By: [name] Entered By: [name] Gurney RN, Wu, [name] 16:07:57 [name] ([id number]) -------------------------------------------------------------------------------- Patient/Caregiver Education Details Patient Name: [name] Date of Service: 04/02/2016 3:30 PM Medical Record Number: [id number] Patient Account Number: [id number] Date of Birth/Gender: Jul 16, 1981 (35 y.o. Female) Treating RN: [name], [name] Primary Care Physician: [name] Other Clinician: Referring Physician: Rolin Wu Treating Physician/Extender: [name] in Treatment: 30 Education Assessment Education Provided To: Patient Education Topics Provided Wound/Skin Impairment: Handouts: Other: change dressing as ordered Methods: Demonstration, Explain/Verbal Responses: State content correctly Electronic Signature(s) Signed: 04/02/2016 5:50:37 PM By: [name] Entered By: [name] on Wu 17:10:08 [name] ([id number]) -------------------------------------------------------------------------------- Wound Assessment Details [name] Date of Service: 04/02/2016 3:30 PM Patient Name: N. Patient Account Number: [id number] Medical Record Treating RN: Phillis Haggis [id number] Number: Other Clinician: June 05, 1981 (34 y.o. Treating [name], [name] Date of Birth/Sex: Female)  Physician/Extender: G Primary Care [name] Physician: Referring Physician: [name] in Treatment: 30 Wound Status Wound Number: 12 Primary Diabetic Wound/Ulcer of the Lower Etiology: Extremity Wound Location: Left Foot - Plantar, Circumfernential Wound Open Status: Wounding Event: Gradually Appeared Comorbid Anemia, Type I Diabetes, End Stage Date Acquired: 07/28/2015 History: Renal Disease, Rheumatoid Arthritis, Weeks Of Treatment: 30 Neuropathy Clustered Wound: No Pending Amputation On Presentation Photos Photo Uploaded By: [name] on Wu 17:32:30 Wound Measurements Length: (cm) 14 Width: (cm) 8.5 Depth: (cm) 1 Area: (cm) 93.462 Volume: (cm) 93.462 % Reduction in Area: -116.4% % Reduction in Volume: -2063.5% Epithelialization: None Tunneling: No Undermining: No Wound Description Classification: Grade 4 Foul Odor After Wound Margin: Well defined, not attached Due to Product Exudate Amount: Large Exudate Type: Purulent Exudate Color: yellow, brown, green Klawitter, Genelle N. (  [id number]) Cleansing: Yes Use: No Wound Bed Granulation Amount: None Present (0%) Exposed Structure Necrotic Amount: Large (67-100%) Fascia Exposed: No Necrotic Quality: Eschar Fat Layer Exposed: No Tendon Exposed: No Muscle Exposed: No Joint Exposed: No Bone Exposed: No Limited to Skin Breakdown Periwound Skin Texture Texture Color No Abnormalities Noted: No No Abnormalities Noted: No Callus: No Atrophie Blanche: No Crepitus: No Cyanosis: No Excoriation: No Ecchymosis: No Fluctuance: No Erythema: No Friable: No Hemosiderin Staining: No Induration: No Mottled: No Localized Edema: No Pallor: No Rash: No Rubor: No Scarring: No Moisture No Abnormalities Noted: No Dry / Scaly: No Maceration: No Moist: No Wound Preparation Ulcer Cleansing: Rinsed/Irrigated with Saline Topical Anesthetic Applied: None Treatment Notes Wound #12  (Left, Plantar, Circumferential Foot) 1. Cleansed with: Clean wound with Normal Saline 4. Dressing Applied: Aquacel Ag 5. Secondary Dressing Applied ABD Pad Dry Gauze Kerlix/Conform 7. Secured with Tape Notes betadine paint and stretch netting DAVANNA, HE ([id number]) Electronic Signature(s) Signed: 04/02/2016 5:50:37 PM By: [name] Entered By: [name] on Wu 16:15:32 [name] ([id number]) -------------------------------------------------------------------------------- Wound Assessment Details Patient Name: [name] Date of Service: 04/02/2016 3:30 PM Medical Record Number: [id number] Patient Account Number: [id number] Date of Birth/Sex: [birth year] (34 y.o. 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 Wound Open Status: Wounding Event: Pressure Injury Comorbid Anemia, Type I Diabetes, End Stage Date Acquired: 04/02/2016 History: Renal Disease, Rheumatoid Arthritis, Weeks Of Treatment: 0 Neuropathy Clustered Wound: No Photos Photo Uploaded By: [name] on Wu 17:33:23 Wound Measurements Length: (cm) 0.6 Width: (cm) 1.1 Depth: (cm) 0.1 Area: (cm) 0.518 Volume: (cm) 0.052 % Reduction in Area: % Reduction in Volume: Epithelialization: None Tunneling: No Undermining: No Wound Description Classification: Category/Stage II Foul Odor A Diabetic Severity (Wagner): Grade 1 Wound Margin: Flat and Intact Exudate Amount: Large Exudate Type: Serosanguineous Exudate Color: red, brown fter Cleansing: No Wound Bed Granulation Amount: None Present (0%) Necrotic Amount: Large (67-100%) Klos, Jacie N. ([id number]) Necrotic  Quality: Eschar Periwound Skin Texture Texture Color No Abnormalities Noted: No No Abnormalities Noted: No Moisture Temperature / Pain No Abnormalities Noted: No Temperature: No Abnormality Moist: Yes Tenderness on Palpation: Yes Wound Preparation Ulcer Cleansing: Rinsed/Irrigated with Saline Topical Anesthetic Applied: Other: lidcaine 4%, Treatment Notes Wound #15 (Right, Lateral Amputation Site - Below Knee) 1. Cleansed with: Clean wound with Normal Saline 2. Anesthetic Topical Lidocaine 4% cream to wound bed prior to debridement 3. Peri-wound Care: Skin Prep 4. Dressing Applied: Aquacel Ag 5. Secondary Dressing Applied Bordered Foam Dressing Electronic Signature(s) Signed: 04/02/2016 5:50:37 PM By: [name] Entered By: [name] on Wu 16:18:14 [name] ([id number]) -------------------------------------------------------------------------------- Wound Assessment Details Patient Name: [name] Date of Service: 04/02/2016 3:30 PM Medical Record Number: [id number] Patient Account Number: [id number] Date of Birth/Sex: [birth year] (34 y.o. Female) Treating RN: [name], [name] Primary Care Physician: [name] Other Clinician: Referring Physician: Rolin Wu Treating Physician/Extender: [name] in Treatment: 30 Wound Status Wound Number: 16 Primary Pressure Ulcer Etiology: Wound Location: Right Amputation Site - Below Knee - Posterior Wound Open Status: Wounding Event: Pressure Injury Comorbid Anemia, Type I Diabetes, End Stage Date Acquired: 04/02/2016 History: Renal Disease, Rheumatoid Arthritis, Weeks Of Treatment: 0 Neuropathy Clustered Wound: No Photos Photo Uploaded By: [name] on Wu 17:33:24 Wound Measurements Length: (  cm) 2.7 Width: (cm) 2.8 Depth: (cm) 0.1 Area: (cm) 5.938 Volume: (cm) 0.594 % Reduction in Area: % Reduction in Volume: Epithelialization:  None Tunneling: No Undermining: No Wound Description Classification: Category/Stage II Foul O Diabetic Severity (Wagner): Grade 1 Wound Margin: Distinct, outline attached Exudate Amount: Large Exudate Type: Serosanguineous Exudate Color: red, brown dor After Cleansing: No Wound Bed Granulation Amount: Medium (34-66%) Granulation Quality: Red, Pink Danh, Camya N. ([id number]) Necrotic Amount: Medium (34-66%) Necrotic Quality: Eschar, Adherent Slough Periwound Skin Texture Texture Color No Abnormalities Noted: No No Abnormalities Noted: No Localized Edema: Yes Erythema: Yes Erythema Location: Circumferential Moisture No Abnormalities Noted: No Temperature / Pain Temperature: No Abnormality Tenderness on Palpation: Yes Wound Preparation Ulcer Cleansing: Rinsed/Irrigated with Saline Topical Anesthetic Applied: Other: lidocaine 4%, Treatment Notes 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 Cleansed with: Clean wound with Normal Saline 2. Anesthetic Topical Lidocaine 4% cream to wound bed prior to debridement 3. Peri-wound Care: Skin Prep 4. Dressing Applied: Aquacel Ag 5. Secondary Dressing Applied Bordered Foam Dressing Electronic Signature(s) Signed: 04/02/2016 5:50:37 PM By: [name] Entered By: [name] on Wu 16:20:20 [name] ([id number]) -------------------------------------------------------------------------------- Wound Assessment Details Patient Name: [name] Date of Service: 04/02/2016 3:30 PM Medical Record Number: [id number] Patient Account Number: [id number] Date of Birth/Sex: 27-Aug-1981 (34 y.o. Female) Treating RN: [name], [name] Primary Care Physician: [name] Other Clinician: Referring Physician: Rolin Wu Treating Physician/Extender: [name] in Treatment: 30 Wound Status Wound Number: 9 Primary Diabetic Wound/Ulcer of the Lower Etiology:  Extremity Wound Location: Left Calcaneous Wound Open Wounding Event: Pressure Injury Status: Date Acquired: 07/28/2015 Comorbid Anemia, Type I Diabetes, End Stage Weeks Of Treatment: 30 History: Renal Disease, Rheumatoid Arthritis, Clustered Wound: No Neuropathy Photos Photo Uploaded By: [name] on Wu 17:33:57 Wound Measurements Length: (cm) 4 Width: (cm) 5.5 Depth: (cm) 0.1 Area: (cm) 17.279 Volume: (cm) 1.728 % Reduction in Area: 7% % Reduction in Volume: 6.9% Epithelialization: None Tunneling: No Undermining: No Wound Description Classification: Grade 2 Exudate Amount: None Present Foul Odor After Cleansing: No Wound Bed Granulation Amount: None Present (0%) Necrotic Amount: Large (67-100%) Necrotic Quality: Eschar Periwound Skin Texture Texture Color No Abnormalities Noted: No No Abnormalities Noted: No Merkey, Tiauna N. ([id number]) Moisture No Abnormalities Noted: No Wound Preparation Ulcer Cleansing: Rinsed/Irrigated with Saline Topical Anesthetic Applied: None Treatment Notes Wound #9 (Left Calcaneous) 1. Cleansed with: Clean wound with Normal Saline 4. Dressing Applied: Santyl Ointment 5. Secondary Dressing Applied ABD Pad Dry Gauze Kerlix/Conform 7. Secured with Tape Notes stretch netting Electronic Signature(s) Signed: 04/02/2016 5:50:37 PM By: [name] Entered By: [name] on Wu 16:16:23 [name] ([id number]) -------------------------------------------------------------------------------- Vitals Details Patient Name: [name] Date of Service: 04/02/2016 3:30 PM Medical Record Number: [id number] Patient Account Number: [id number] Date of Birth/Sex: November 24, 1980 (34 y.o. Female) Treating RN: [name], [name] Primary Care Physician: [name] Other Clinician: Referring Physician: Rolin Wu Treating Physician/Extender: [name] in Treatment: 30 Vital  Signs Time Taken: 15:58 Temperature (F): 97.7 Height (in): 68 Pulse (bpm): 79 Weight (lbs): 96 Respiratory Rate (breaths/min): 18 Body Mass Index (BMI): 14.6 Blood Pressure (mmHg): 99/73 Reference Range: 80 - 120 mg / dl Electronic Signature(s) Signed: 04/02/2016 4:09:14 PM By: [name] Gurney, RN, Wu, [name] Entered By: [name] Gurney, RN, Wu, [name] 16:08:04

## 2016-04-03 NOTE — Progress Notes (Addendum)
AYRIAN, CARRISALEZ ([id number]) Visit Report for 04/02/2016 Chief Complaint Document Details Patient Name: [name], SCHRAY. Date of Service: 04/02/2016 3:30 PM Medical Record Number: [id number] Patient Account Number: [id number] Date of Birth/Sex: 11-12-1980 (34 y.o. Female) Treating RN: Phillis Haggis Primary Care Physician: [name] Other Clinician: Referring Physician: [name] Treating Physician/Extender: Rudene Re in Treatment: 30 Information Obtained from: Patient Chief Complaint Patient in today for treatment of non-healing wound and HBO Treatment. she has just gotten out of hospital this week and is back to resume her hyperbaric oxygen therapy Electronic Signature(s) Signed: 04/02/2016 4:48:21 PM By: [name] MD, FACS Previous Signature: 04/02/2016 4:08:00 PM Version By: [name] MD, FACS Entered By: [name] on 04/02/2016 16:48:21 [name] ([id number]) -------------------------------------------------------------------------------- Debridement Details Patient Name: [name] Date of Service: 04/02/2016 3:30 PM Medical Record Number: [id number] Patient Account Number: [id number] Date of Birth/Sex: [birth year] (34 y.o. Female) Treating RN: Ashok Cordia, Debi Primary Care Physician: [name] Other Clinician: Referring Physician: [name] Treating Physician/Extender: Rudene Re in Treatment: 30 Debridement Performed for Wound #15 Right,Lateral Amputation Site - Below Knee Assessment: Performed By: Physician [name], MD Debridement: Debridement Pre-procedure Yes Verification/Time Out Taken: Start Time: 16:26 Pain Control: Other : lidocaine 4% cream Level: Skin/Subcutaneous Tissue Total Area Debrided (L x 0.6 (cm) x 1.1 (cm) = 0.66 (cm) W): Tissue and other Viable, Non-Viable, Exudate, Fibrin/Slough, Subcutaneous material debrided: Instrument: Forceps, Scissors Bleeding:  Minimum Hemostasis Achieved: Pressure End Time: 16:27 Procedural Pain: 0 Post Procedural Pain: 0 Response to Treatment: Procedure was tolerated well Post Debridement Measurements of Total Wound Length: (cm) 0.6 Stage: Category/Stage II Width: (cm) 1.1 Depth: (cm) 0.2 Volume: (cm) 0.104 Post Procedure Diagnosis Same as Pre-procedure Electronic Signature(s) Signed: 04/02/2016 4:47:47 PM By: [name] MD, FACS Signed: 04/02/2016 5:50:37 PM By: [name] Entered By: [name] on 04/02/2016 16:47:47 [name] ([id number]) -------------------------------------------------------------------------------- Debridement Details Patient Name: [name] Date of Service: 04/02/2016 3:30 PM Medical Record Number: [id number] Patient Account Number: [id number] Date of Birth/Sex: April 20, 1981 (34 y.o. Female) Treating RN: Ashok Cordia, Debi Primary Care Physician: [name] Other Clinician: Referring Physician: [name] Treating Physician/Extender: Rudene Re in Treatment: 30 Debridement Performed for Wound #9 Left Calcaneous Assessment: Performed By: Physician [name], MD Debridement: Debridement Pre-procedure Yes Verification/Time Out Taken: Start Time: 16:33 Pain Control: Other : lidocaine 4% cream Level: Skin/Subcutaneous Tissue Total Area Debrided (L x 4 (cm) x 5.5 (cm) = 22 (cm) W): Tissue and other Viable, Non-Viable, Exudate, Fibrin/Slough, Subcutaneous material debrided: Instrument: Forceps, Scissors Bleeding: Minimum Hemostasis Achieved: Pressure End Time: 16:42 Procedural Pain: 0 Post Procedural Pain: 0 Response to Treatment: Procedure was tolerated well Post Debridement Measurements of Total Wound Length: (cm) 4 Width: (cm) 5.5 Depth: (cm) 0.2 Volume: (cm) 3.456 Post Procedure Diagnosis Same as Pre-procedure Electronic Signature(s) Signed: 04/02/2016 4:48:10 PM By: [name] MD, FACS Signed: 04/02/2016  5:50:37 PM By: [name] Entered By: [name] on 04/02/2016 16:48:10 [name] ([id number]) -------------------------------------------------------------------------------- HPI Details Patient Name: [name] Date of Service: 04/02/2016 3:30 PM Medical Record Number: [id number] Patient Account Number: [id number] Date of Birth/Sex: 1981/02/15 (34 y.o. Female) Treating RN: Phillis Haggis Primary Care Physician: [name] Other Clinician: Referring Physician: [name] Treating Physician/Extender: Rudene Re in Treatment: 30 History of Present Illness Location: dry gangrene both feet and heels Quality: Patient reports No Pain. Severity: Patient states wound are getting worse. Duration: Patient has had the wound for > 21months prior to  seeking treatment at the wound center Context: The wound appeared gradually over time Modifying Factors: she has been in and out of hospital over the last 2 months Associated Signs and Symptoms: Patient reports having difficulty standing for long periods. HPI Description: [name] is a 35 y.o. female who presents to our wound center, back in June 2016, referred by her PCP Dr. Zada Finders for nonhealing ulcers on the lateral aspect of the right heel. Of note she has a history of type 1 diabetes mellitus that has been uncontrolled. Past medical history significant for type 1 diabetes mellitus not controlled, ankylosing spondylitis, anorexia nervosa, irritable bowel syndrome, chronic kidney disease, chronic diarrhea. she then developed gangrene of both feet due to severe peripheral vascular disease and also had gangrene of the tips of her fingers due to upper extremity vascular disease. She was being worked up by vascular surgery at [facility] and at [facility] and has had several procedures done there. She started with hyperbaric oxygen therapy and had a total of 40 treatments the last one being on 06/20/2015. After  the initial treatment of hyperbaric oxygen therapy she started having ear problems and had ultimately to use myringotomy tubes and this was done bilaterally. Since then her ears have been doing fine. In late September, she had seen vascular and hand surgeons. since then she's been in [place] at the rec center for surgery involving extensive vascular procedures for the upper extremities. She was then at [facility] with GI bleeds both upper and lower and has been in and out of hospital for that. She has recently been out of hospital for the last week. 09/09/2015 -- she was unable to get here in time to start her hyperbaric oxygen today and hence is only here for a wound care visit. 09/19/2015 -- she has been having vancomycin during her dialysis and continues to have vascular appointments and the procedure is been set for early January. She has been unable to make it for her hemodialysis due to various medical symptoms. 09/30/2014 -- her vancomycin was stopped on 09/25/2015 and the mother has noticed the right foot has started draining for the last 3 days. Addendum: after examining the patient I was able to talk to her primary vascular surgeon Dr. Pernell Dupre at the [facility]. I told him about the necrotic area on the plantar aspect of right foot which is now wet gangrene and he agreed with me that he would admit her at [facility] under her care and synchronize further treatment. We have discussed her poor prognosis and he and I discussed the need for hospice care and for sitting down and talking to the patient and her mother and giving them a proper detail of the prognosis. 11/29/2014 -- She was admitted to the [facility] on January 3 and discharged on January 25 and had [place], JOYLEEN HASELTON. 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 She was worked up with MRIs, abdomen aortogram and placement of left-sided angioplasties were done. After a prolonged hospital she she was discharged home and was told to wear shrinker sock and stump protector and see her surgeon for further instructions regarding wound care and suture removal. Asked to take long-term doxycycline. 01/15/16; this is a patient I haven't seen before  although she is been followed by Dr. Meyer Russel in this clinic today. She is a type I diabetic with severe PAD macrovascular disease. She has had a previous BKA. She has dry gangrene of the tips of her fingers which she showed me on the right to. She also has dry gangrene of the left first second and third toes and a portion of her proximal foot around these areas. She is followed by vascular surgery at [facility] and saw them recently they are not going to do surgery as of yet. She has a large black eschar over her heel which is beginning to separate in some areas. As I understand think she is paining these with Betadine. There is been some suggestion about retrying hyperbarics on her although she is still not able to commit to the frequency of treatment that would be necessary to see improvement. She is also on Monday Wednesday Friday dialysis 01/21/16; the patient returns to see me today with regards to the left heel. Apparently she is not scheduled for any further attempts at revascularization of the left foot. 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 01/28/16; the patient returns today for attention to the left foot specifically the left calcaneal ulcer. This is covered in a thick black eschar. I crosshatched this last week and we have been using Santyl. 02/05/16; I continue to work on the thick black  eschar on the patient's left calcaneus. She is using Santyl that this side crosshatched this area and the eschar is beginning to loosen. 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 This is beginning to separate and liquefy as well especially between the first and second toes. In the time being her major complaint is fatigue at dialysis 02/26/16 I continue to work on the patient's left calcaneus thick adherent black eschar. I crosshatched this area and we have been using Santyl although it is very adherent area I remove some nonviable tissue today what I can see of this actually looks surprisingly good. On the same foot she has dry gangrene on the first and second toes and part of the forefoot underneath this. This is beginning to separate. 03/11/16; the patient comes in for her every 2 week appointment. I have been working on the black eschar on her heel. The patient apparently again has to come off dialysis early today after 2 hours due to severe complaints of nausea. She really does not look well. 04/02/2016 -- the patient has not been seen here for about 3 weeks now and has a new issue with the stump of the right amputation site and also her right posterior thigh. They have only noticed this for the last couple of days. Electronic Signature(s) Signed: 04/02/2016 4:49:10 PM By: [name] MD, FACS Previous Signature: 04/02/2016 4:08:45 PM Version By: [name] MD, FACS Entered By: [name] on 04/02/2016 16:49:10 [name] ([id number]) -------------------------------------------------------------------------------- Physical Exam Details Patient Name: [name] Date of Service: 04/02/2016 3:30 PM Medical Record Number: [id number] Patient Account Number: [id number] Date of Birth/Sex: 1981-01-27 (34 y.o. Female) Treating RN: Phillis Haggis Primary Care Physician: [name] Other Clinician: Referring Physician: [name] Treating Physician/Extender: Rudene Re in Treatment: 30 Constitutional . Pulse regular. Respirations normal and unlabored. Afebrile. . Eyes Nonicteric. Reactive to light. Ears,  Nose, Mouth, and Throat Lips, teeth, and gums WNL.Marland Kitchen Moist mucosa without lesions. Neck supple and nontender. No palpable supraclavicular or cervical adenopathy. Normal sized without goiter. Respiratory WNL. No retractions.. Cardiovascular Pedal Pulses WNL. No clubbing, cyanosis or edema. Lymphatic No adneopathy. No adenopathy. No adenopathy. Musculoskeletal Adexa without tenderness or enlargement.. Digits and nails w/o clubbing, cyanosis, infection, petechiae, ischemia, or inflammatory conditions.. Integumentary (Hair, Skin) No suspicious lesions. No crepitus or fluctuance. No peri-wound warmth or erythema. No masses.Marland Kitchen Psychiatric Judgement and insight Intact.. No evidence of depression, anxiety, or agitation.. Notes the right lateral incision site of the BKA has a eschar and subcutaneous debris which have sharply removed with the forcep and scissors to show a fairly shallow ulcer with no active bleeding. On the right posterior thigh where her prosthesis fits she has a significant breakdown of skin and this is superficial. The left first and second toe which is showing dry gangrene is now demarcated very well and on gentle pressure there is seropurulent material which is draining through. Debridement of this in the office is not possible. The right calcaneal area where she has a dry eschar has separated enough for me to sharply debrided this with a forceps and scissors and this was entirely removed. No pus or cellulitis in this area. Electronic Signature(s) Signed: 04/02/2016 4:51:07 PM By: [name] MD, FACS Entered By: [name] on 04/02/2016 16:51:06 [name]  ([id number]) Simeon Craft, Ledell Peoples ([id number]) -------------------------------------------------------------------------------- Physician Orders Details Patient Name: [name] Date of Service: 04/02/2016 3:30 PM Medical Record Number: [id number] Patient Account Number: [id number] Date of Birth/Sex: [birth year] (34 y.o. Female) Treating RN: Ashok Cordia, Debi Primary Care Physician: [name] Other Clinician: Referring Physician: [name] Treating Physician/Extender: Rudene Re in Treatment: 30 Verbal / Phone Orders: Yes Clinician: Ashok Cordia, Debi Read Back and Verified: Yes Diagnosis Coding ICD-10 Coding Code Description E10.621 Type 1 diabetes mellitus with foot ulcer E10.52 Type 1 diabetes mellitus with diabetic peripheral angiopathy with gangrene I70.245 Atherosclerosis of native arteries of left leg with ulceration of other part of foot I70.261 Atherosclerosis of native arteries of extremities with gangrene, right leg L89.622 Pressure ulcer of left heel, stage 2 Z89.511 Acquired absence of right leg below knee Wound Cleansing Wound #12 Left,Plantar,Circumferential Foot o Clean wound with Normal Saline. o Cleanse wound with mild soap and water Wound #9 Left Calcaneous o Clean wound with Normal Saline. o Cleanse wound with mild soap and water Anesthetic Wound #15 Right,Lateral Amputation Site - Below Knee o Topical Lidocaine 4% cream applied to wound bed prior to debridement 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,Posterior Amputation Site - Below Knee o Topical Lidocaine 4% cream applied to wound bed prior to debridement Skin Barriers/Peri-Wound Care Wound #15 Right,Lateral Amputation Site - Below Knee o Skin Prep 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,Posterior Amputation Site - Below Knee o Skin Prep Primary Wound Dressing Wound #12 Left,Plantar,Circumferential Foot o Aquacel Ag - to wet areas in between the toes Sposito, Massiah N. ([id number]) o Other: - betadine  STICKS paint to dry areas ****PLEASE ORDER SOME FOR PT***** Wound #15 Right,Lateral Amputation Site - Below Knee o Aquacel Ag 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,Posterior Amputation Site - Below Knee o Aquacel Ag Wound #9 Left Calcaneous o Santyl Ointment Secondary Dressing Wound #12 Left,Plantar,Circumferential Foot o ABD pad o Gauze and Kerlix/Conform Wound #15 Right,Lateral Amputation Site - Below Knee o Boardered Foam Dressing 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,Posterior Amputation Site - Below Knee o Boardered Foam Dressing Wound #9 Left Calcaneous o ABD pad o Gauze and Kerlix/Conform Dressing  Change Frequency Wound #12 Left,Plantar,Circumferential Foot o Change dressing every day. Wound #15 Right,Lateral Amputation Site - Below Knee o Change dressing every other day. 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,Posterior Amputation Site - Below Knee o Change dressing every other day. Wound #9 Left Calcaneous o Change dressing every day. Follow-up Appointments Wound #12 Left,Plantar,Circumferential Foot o Return Appointment in 2 weeks. o Other: - as patient is able Wound #9 Left Calcaneous o Return Appointment in 2 weeks. o Other: - as patient is able Goslin, Malky N. ([id number]) Off-Loading Wound #12 Left,Plantar,Circumferential Foot o Open toe surgical shoe with peg assist. Wound #9 Left Calcaneous o Open toe surgical shoe with peg assist. Electronic Signature(s) Signed: 04/02/2016 4:55:16 PM By: [name] MD, FACS Signed: 04/02/2016 5:50:37 PM By: [name] Entered By: [name] on 04/02/2016 16:46:36 [name] ([id number]) -------------------------------------------------------------------------------- Problem List Details Patient Name: [name] Date of Service: 04/02/2016 3:30 PM Medical Record Number: [id number] Patient Account Number: [id number] Date of Birth/Sex: 1981/09/11 (34 y.o. Female) Treating RN: Ashok Cordia, Debi Primary Care  Physician: [name] Other Clinician: Referring Physician: [name] Treating Physician/Extender: Rudene Re in Treatment: 30 Active Problems ICD-10 Encounter Code Description Active Date Diagnosis E10.621 Type 1 diabetes mellitus with foot ulcer 08/30/2015 Yes E10.52 Type 1 diabetes mellitus with diabetic peripheral 08/30/2015 Yes angiopathy with gangrene I70.245 Atherosclerosis of native arteries of left leg with ulceration 08/30/2015 Yes of other part of foot I70.261 Atherosclerosis of native arteries of extremities with 08/30/2015 Yes gangrene, right leg L89.622 Pressure ulcer of left heel, stage 2 08/30/2015 Yes Z89.511 Acquired absence of right leg below knee 11/29/2015 Yes T87.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, right lower extremity 04/02/2016 Yes S71.101A Unspecified open wound, right thigh, initial encounter 04/02/2016 Yes Inactive Problems Resolved Problems ICD-10 Code Description Active Date Resolved Date L02.611 Cutaneous abscess of right foot 10/01/2015 10/01/2015 MINTA, FAIR ([id number]) Electronic Signature(s) Signed: 04/02/2016 4:47:33 PM By: [name] MD, FACS Previous Signature: 04/02/2016 4:07:52 PM Version By: [name] MD, FACS Entered By: [name] on 04/02/2016 16:47:33 [name] ([id number]) -------------------------------------------------------------------------------- Progress Note Details Patient Name: [name] Date of Service: 04/02/2016 3:30 PM Medical Record Number: [id number] Patient Account Number: [id number] Date of Birth/Sex: 06-19-1981 (34 y.o. Female) Treating RN: Phillis Haggis Primary Care Physician: [name] Other Clinician: Referring Physician: [name] Treating Physician/Extender: Rudene Re in Treatment: 30 Subjective Chief Complaint Information obtained from Patient Patient in today for treatment of non-healing wound and HBO Treatment. she has just gotten out of  hospital this week and is back to resume her hyperbaric oxygen therapy History of Present Illness (HPI) The following HPI elements were documented for the patient's wound: Location: dry gangrene both feet and heels Quality: Patient reports No Pain. Severity: Patient states wound are getting worse. Duration: Patient has had the wound for > 88months prior to seeking treatment at the wound center Context: The wound appeared gradually over time Modifying Factors: she has been in and out of hospital over the last 2 months Associated Signs and Symptoms: Patient reports having difficulty standing for long periods. Yoseline Andersson is a 35 y.o. female who presents to our wound center, back in June 2016, referred by her PCP Dr. Zada Finders for nonhealing ulcers on the lateral aspect of the right heel. Of note she has a history of type 1 diabetes mellitus that has been uncontrolled. Past medical history significant for type 1 diabetes mellitus not controlled, ankylosing spondylitis, anorexia nervosa, irritable bowel syndrome, chronic kidney disease, chronic  diarrhea. she then developed gangrene of both feet due to severe peripheral vascular disease and also had gangrene of the tips of her fingers due to upper extremity vascular disease. She was being worked up by vascular surgery at [facility] and at [facility] and has had several procedures done there. She started with hyperbaric oxygen therapy and had a total of 40 treatments the last one being on 06/20/2015. After the initial treatment of hyperbaric oxygen therapy she started having ear problems and had ultimately to use myringotomy tubes and this was done bilaterally. Since then her ears have been doing fine. In late September, she had seen vascular and hand surgeons. since then she's been in [place] at the rec center for surgery involving extensive vascular procedures for the upper extremities. She was then at [facility] with GI bleeds both upper and lower and  has been in and out of hospital for that. She has recently been out of hospital for the last week. 09/09/2015 -- she was unable to get here in time to start her hyperbaric oxygen today and hence is only here for a wound care visit. 09/19/2015 -- she has been having vancomycin during her dialysis and continues to have vascular appointments and the procedure is been set for early January. She has been unable to make it for her hemodialysis due to various medical symptoms. 09/30/2014 -- her vancomycin was stopped on 09/25/2015 and the mother has noticed the right foot has Donaldson, Lizbet N. ([id number]) started draining for the last 3 days. Addendum: after examining the patient I was able to talk to her primary vascular surgeon Dr. Pernell Dupre at the [facility]. I told him about the necrotic area on the plantar aspect of right foot which is now wet gangrene and he agreed with me that he would admit her at [facility] under her care and synchronize further treatment. We have discussed her poor prognosis and he and I discussed the need for hospice care and for sitting down and talking to the patient and her mother and giving them a proper detail of the prognosis. 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 She was worked up with MRIs, abdomen aortogram and placement of left-sided angioplasties were done. After a prolonged hospital she she was discharged home and was told to wear shrinker sock and stump protector and see her surgeon for further instructions regarding wound care and suture removal. Asked to  take long-term doxycycline. 01/15/16; this is a patient I haven't seen before although she is been followed by Dr. Meyer Russel in this clinic today. She is a type I diabetic with severe PAD macrovascular disease. She has had a previous BKA. She has dry gangrene of the tips of her fingers which she showed me on the right to. She also has dry gangrene of the left first second and third toes and a portion of her proximal foot around these areas. She is followed by vascular surgery at [facility] and saw them recently they are not going to do surgery as of yet. She has a large black eschar over her heel which is beginning to separate in some areas. As I understand think  she is paining these with Betadine. There is been some suggestion about retrying hyperbarics on her although she is still not able to commit to the frequency of treatment that would be necessary to see improvement. She is also on Monday Wednesday Friday dialysis 01/21/16; the patient returns to see me today with regards to the left heel. Apparently she is not scheduled for any further attempts at revascularization of the left foot. 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 01/28/16; the patient returns today for attention to the left foot specifically the left calcaneal ulcer. This is covered in a thick black eschar. I crosshatched this last week and we have been using Santyl. 02/05/16; I continue to work on the thick black eschar on the patient's left calcaneus. She is using Santyl that this side crosshatched this area and the eschar is beginning to loosen. 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 This is beginning to separate and liquefy as well especially between the first and second toes. In the time being her major complaint is fatigue at  dialysis 02/26/16 I continue to work on the patient's left calcaneus thick adherent black eschar. I crosshatched this area and we have been using Santyl although it is very adherent area I remove some nonviable tissue today what I can see of this actually looks surprisingly good. On the same foot she has dry gangrene on the first and second toes and part of the forefoot underneath this. This is beginning to separate. 03/11/16; the patient comes in for her every 2 week appointment. I have been working on the black eschar on her heel. The patient apparently again has to come off dialysis early today after 2 hours due to severe complaints of nausea. She really does not look well. 04/02/2016 -- the patient has not been seen here for about 3 weeks now and has a new issue with the stump of the right amputation site and also her right posterior thigh. They have only noticed this for the last couple of days. KRYSTALL, KRUCKENBERG ([id number]) Objective Constitutional Pulse regular. Respirations normal and unlabored. Afebrile. Vitals Time Taken: 3:58 PM, Height: 68 in, Weight: 96 lbs, BMI: 14.6, Temperature: 97.7 F, Pulse: 79 bpm, Respiratory Rate: 18 breaths/min, Blood Pressure: 99/73 mmHg. Eyes Nonicteric. Reactive to light. Ears, Nose, Mouth, and Throat Lips, teeth, and gums WNL.Marland Kitchen Moist mucosa without lesions. Neck supple and nontender. No palpable supraclavicular or cervical adenopathy. Normal sized without goiter. Respiratory WNL. No retractions.. Cardiovascular Pedal Pulses WNL. No clubbing, cyanosis or edema. Lymphatic No adneopathy. No adenopathy. No adenopathy. Musculoskeletal Adexa without tenderness or enlargement.. Digits and nails w/o clubbing, cyanosis, infection, petechiae, ischemia, or inflammatory conditions.Marland Kitchen Psychiatric Judgement and insight Intact.. No evidence of depression, anxiety, or agitation.. 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 On the right posterior thigh where her prosthesis fits she has a significant breakdown of skin and this is superficial. The left first and second toe which is showing dry gangrene is now demarcated very well and on gentle pressure there is seropurulent material which is draining through. Debridement of this in the office is not possible.  The right calcaneal area where she has a dry eschar has separated enough for me to sharply debrided this with a forceps and scissors and this was entirely removed. No pus or cellulitis in this area. Integumentary (Hair, Skin) No suspicious lesions. No crepitus or fluctuance. No peri-wound warmth or erythema. No masses.. Wound #12 status is Open. Original cause of wound was Gradually Appeared. The wound is located on the Left,Plantar,Circumferential Foot. The wound measures 14cm length x 8.5cm width x 1cm depth; 93.462cm^2 area and 93.462cm^3 volume. The wound is limited to skin breakdown. There is no tunneling Trebilcock, Ariza N. ([id number]) or undermining noted. There is a large amount of purulent drainage noted. The wound margin is well defined and not attached to the wound base. There is no granulation within the wound bed. There is a large (67-100%) amount of necrotic tissue within the wound bed including Eschar. The periwound skin appearance did not exhibit: Callus, Crepitus, Excoriation, Fluctuance, Friable, Induration, Localized Edema, Rash, Scarring, Dry/Scaly, Maceration, Moist, Atrophie Blanche, Cyanosis, Ecchymosis, Hemosiderin Staining, Mottled, Pallor, Rubor, Erythema. Wound #15 status is Open. Original cause of wound was Pressure Injury. The wound is located on the Right,Lateral Amputation Site - Below Knee. The wound measures 0.6cm length x 1.1cm width x 0.1cm depth; 0.518cm^2 area and 0.052cm^3 volume. There is no  tunneling or undermining noted. There is a large amount of serosanguineous drainage noted. The wound margin is flat and intact. There is no granulation within the wound bed. There is a large (67-100%) amount of necrotic tissue within the wound bed including Eschar. The periwound skin appearance exhibited: Moist. Periwound temperature was noted as No Abnormality. The periwound has tenderness on palpation. Wound #16 status is Open. Original cause of wound was Pressure Injury. 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 The wound measures 2.7cm length x 2.8cm width x 0.1cm depth; 5.938cm^2 area and 0.594cm^3 volume. There is no tunneling or undermining noted. There is a large amount of serosanguineous drainage noted. The wound margin is distinct with the outline attached to the wound base. There is medium (34-66%) red, pink granulation within the wound bed. There is a medium (34-66%) amount of necrotic tissue within the wound bed including Eschar and Adherent Slough. The periwound skin appearance exhibited: Localized Edema, Erythema. The surrounding wound skin color is noted with erythema which is circumferential. Periwound temperature was noted as No Abnormality. The periwound has tenderness on palpation. Wound #9 status is Open. Original cause of wound was Pressure Injury. The wound is located on the Left Calcaneous. The wound measures 4cm length x 5.5cm width x 0.1cm depth; 17.279cm^2 area and 1.728cm^3 volume. There is no tunneling or undermining noted. There is a none present amount of drainage noted. There is no granulation within the wound bed. There is a large (67-100%) amount of necrotic tissue within the wound bed including Eschar. 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 ([id number]) Having reviewed her wounds today I have strongly recommended: 1. Offloading completely  of the right amputation stump and thigh but not wearing her prosthesis 2. Silver alginate to be applied to the wounds on the right amputation stump and right thigh and this is to be changed every other day and covered with a bordered foam 3. Her left medial forefoot which includes the demarcated area of the first and second toe have now clearly reached a stage where operative debridement needs to be done so as to prevent infection or collection of pus under the edges and necrotic debris. She is urged to see a Careers adviser at [facility] soon 4. Continue with Santyl on her heel. The patient and her mother have had all questions answered and see has back as soon as convenient for them. Procedures Wound #15 Wound #15 is a Pressure Ulcer located on the Right,Lateral Amputation Site - Below Knee . There was a Skin/Subcutaneous Tissue Debridement ([id number]) debridement with total area of 0.66 sq cm performed by [name], MD. with the following instrument(s): Forceps and Scissors to remove Viable and Non-Viable tissue/material including Exudate, Fibrin/Slough, and Subcutaneous after achieving pain control using Other (lidocaine 4% cream). A time out was conducted prior to the start of the procedure. A Minimum amount of bleeding was controlled with Pressure. The procedure was tolerated well with a pain level of 0 throughout and a pain level of 0 following the procedure. Post Debridement Measurements: 0.6cm length x 1.1cm width x 0.2cm  depth; 0.104cm^3 volume. Post debridement Stage noted as Category/Stage II. Post procedure Diagnosis Wound #15: Same as Pre-Procedure Wound #9 Wound #9 is a Diabetic Wound/Ulcer of the Lower Extremity located on the Left Calcaneous . There was a Skin/Subcutaneous Tissue Debridement ([id number]) debridement with total area of 22 sq cm performed by [name], MD. with the following instrument(s): Forceps and Scissors to remove Viable and Non- Viable tissue/material including Exudate, Fibrin/Slough, and Subcutaneous after achieving pain control using Other (lidocaine 4% cream). A time out was conducted prior to the start of the procedure. A Minimum amount of bleeding was controlled with Pressure. The procedure was tolerated well with a pain level of 0 throughout and a pain level of 0 following the procedure. Post Debridement Measurements: 4cm length x 5.5cm width x 0.2cm depth; 3.456cm^3 volume. Post procedure Diagnosis Wound #9: Same as Pre-Procedure Plan JLEE, HARKLESS N. ([id number]) Wound Cleansing: Wound #12 Left,Plantar,Circumferential Foot: Clean wound with Normal Saline. Cleanse wound with mild soap and water Wound #9 Left Calcaneous: Clean wound with Normal Saline. Cleanse wound with mild soap and water Anesthetic: Wound #15 Right,Lateral Amputation Site - Below Knee: Topical Lidocaine 4% cream applied to wound bed prior to debridement 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,Posterior Amputation Site - Below Knee: Topical Lidocaine 4% cream applied to wound bed prior to debridement Skin Barriers/Peri-Wound Care: Wound #15 Right,Lateral Amputation Site - Below Knee: Skin Prep 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,Posterior Amputation Site - Below Knee: Skin Prep Primary Wound Dressing: Wound #12 Left,Plantar,Circumferential Foot: Aquacel Ag - to wet areas in between the toes Other: - betadine STICKS paint to dry areas ****PLEASE ORDER SOME FOR PT***** Wound #15 Right,Lateral Amputation Site - Below Knee: Aquacel  Ag 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,Posterior Amputation Site - Below Knee: Aquacel Ag Wound #9 Left Calcaneous: Santyl Ointment Secondary Dressing: Wound #12 Left,Plantar,Circumferential Foot: ABD pad Gauze and Kerlix/Conform Wound #15 Right,Lateral Amputation Site - Below Knee: Boardered Foam Dressing 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,Posterior Amputation Site - Below Knee: Boardered Foam Dressing Wound #9 Left Calcaneous: ABD pad Gauze and Kerlix/Conform Dressing Change Frequency: Wound #12 Left,Plantar,Circumferential Foot: Change  dressing every day. Wound #15 Right,Lateral Amputation Site - Below Knee: Change dressing every other day. 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,Posterior Amputation Site - Below Knee: Change dressing every other day. Wound #9 Left Calcaneous: Change dressing every day. Follow-up Appointments: Wound #12 Left,Plantar,Circumferential Foot: Return Appointment in 2 weeks. MILEY, BLANCHETT ([id number]) Other: - as patient is able Wound #9 Left Calcaneous: Return Appointment in 2 weeks. Other: - as patient is able Off-Loading: Wound #12 Left,Plantar,Circumferential Foot: Open toe surgical shoe with peg assist. Wound #9 Left Calcaneous: Open toe surgical shoe with peg assist. Having reviewed her wounds today I have strongly recommended: 1. Offloading completely of the right amputation stump and thigh but not wearing her prosthesis 2. Silver alginate to be applied to the wounds on the right amputation stump and right thigh and this is to be changed every other day and covered with a bordered foam 3. Her left medial forefoot which includes the demarcated area of the first and second toe have now clearly reached a stage where operative debridement needs to be done so as to prevent infection or collection of pus under the edges and necrotic debris. She is urged to see a Careers adviser at [facility] soon 4. Continue with Santyl on her heel. The patient and her mother have had all questions answered and see  has back as soon as convenient for them. Electronic Signature(s) Signed: 04/02/2016 4:54:03 PM By: [name] MD, FACS Entered By: [name] on 04/02/2016 16:54:03 [name] ([id number]) -------------------------------------------------------------------------------- SuperBill Details Patient Name: [name] Date of Service: 04/02/2016 Medical Record Number: [id number] Patient Account Number: [id number] Date of Birth/Sex: July 20, 1981 (34 y.o. 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, right leg L89.622 Pressure ulcer of left heel, stage 2 Z89.511 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, right lower extremity S71.101A Unspecified open wound, right thigh, initial encounter Facility Procedures CPT4 Code Description: [id number] 11042 - DEB SUBQ TISSUE 20 SQ CM/< ICD-10 Description Diagnosis E10.621 Type 1 diabetes mellitus with foot ulcer E10.52 Type 1 diabetes mellitus with diabetic peripheral angiop T87.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, right  lower extremity L89.622 Pressure ulcer of left heel, stage 2 Modifier: athy with gangre Quantity: 1 ne CPT4 Code Description: [id number] 11045 - DEB SUBQ TISS EA ADDL 20CM ICD-10 Description Diagnosis E10.621 Type 1 diabetes mellitus with foot ulcer L89.622 Pressure ulcer of left heel, stage 2 T87.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, right lower extremity Modifier: Quantity: 1 Physician Procedures CPT4  Code Description: [id number] 99213 - WC PHYS LEVEL 3 - EST PT ICD-10 Description Diagnosis E10.621 Type 1 diabetes mellitus with foot ulcer Harten, Elton N. ([id number]) Modifier: 25 Quantity: 1 Electronic Signature(s) Signed: 04/02/2016 4:54:57 PM By: [name] MD, FACS Entered By: [name] on 04/02/2016 16:54:57

## 2016-04-14 ENCOUNTER — Ambulatory Visit: Payer: Medicare Other | Admitting: Internal Medicine

## 2016-04-28 ENCOUNTER — Encounter: Payer: Medicare Other | Attending: Internal Medicine | Admitting: Internal Medicine

## 2016-04-28 DIAGNOSIS — I70261 Atherosclerosis of native arteries of extremities with gangrene, right leg: Secondary | ICD-10-CM | POA: Diagnosis not present

## 2016-04-28 DIAGNOSIS — E43 Unspecified severe protein-calorie malnutrition: Secondary | ICD-10-CM | POA: Diagnosis not present

## 2016-04-28 DIAGNOSIS — E10621 Type 1 diabetes mellitus with foot ulcer: Secondary | ICD-10-CM | POA: Insufficient documentation

## 2016-04-28 DIAGNOSIS — E1052 Type 1 diabetes mellitus with diabetic peripheral angiopathy with gangrene: Secondary | ICD-10-CM | POA: Diagnosis not present

## 2016-04-28 DIAGNOSIS — T87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, right lower extremity: Secondary | ICD-10-CM | POA: Insufficient documentation

## 2016-04-28 DIAGNOSIS — Z89511 Acquired absence of right leg below knee: Secondary | ICD-10-CM | POA: Insufficient documentation

## 2016-04-28 DIAGNOSIS — S71101A Unspecified open wound, right thigh, initial encounter: Secondary | ICD-10-CM | POA: Diagnosis not present

## 2016-04-28 DIAGNOSIS — Z992 Dependence on renal dialysis: Secondary | ICD-10-CM | POA: Insufficient documentation

## 2016-04-28 DIAGNOSIS — X58XXXA Exposure to other specified factors, initial encounter: Secondary | ICD-10-CM | POA: Diagnosis not present

## 2016-04-28 DIAGNOSIS — L89622 Pressure ulcer of left heel, stage 2: Secondary | ICD-10-CM | POA: Insufficient documentation

## 2016-04-28 DIAGNOSIS — E1022 Type 1 diabetes mellitus with diabetic chronic kidney disease: Secondary | ICD-10-CM | POA: Diagnosis not present

## 2016-04-28 DIAGNOSIS — I70245 Atherosclerosis of native arteries of left leg with ulceration of other part of foot: Secondary | ICD-10-CM | POA: Insufficient documentation

## 2016-04-28 DIAGNOSIS — N186 End stage renal disease: Secondary | ICD-10-CM | POA: Insufficient documentation

## 2016-04-29 NOTE — Progress Notes (Signed)
[name], Wu ([id number]) Visit Report for 04/28/2016 Chief Complaint Document Details [name], Wu Date of Service: 04/28/2016 10:45 AM Patient Name: N. Patient Account Number: [id number] Medical Record Treating RN: [name] [id number] Number: Other Clinician: [birth year] (34 y.o. [name] Date of Birth/Sex: Female) Physician/Extender: G Primary Care [name] Physician: Referring Physician: [name] Wu in Treatment: 9 Information Obtained from: Patient Chief Complaint Patient in today for treatment of non-healing wound and HBO Treatment. she has just gotten out of hospital this week and is back to resume her hyperbaric oxygen therapy Electronic Signature(s) Signed: 04/29/2016 7:57:27 AM By: [name] Najjar MD Entered By: [name] on 04/28/2016 12:08:42 [name] ([id number]) -------------------------------------------------------------------------------- Debridement Details [name] Date of Service: 04/28/2016 10:45 AM Patient Name: N. Patient Account Number: [id number] Medical Record Treating RN: [name] [id number] Number: Other Clinician: [birth year] (34 y.o. [name] Date of Birth/Sex: Female) Physician/Extender: G Primary Care [name] Physician: Referring Physician: [name] Wu in Treatment: 34 Debridement Performed for 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,Posterior Amputation Site - Below Knee Assessment: Performed By: Physician [name] Caul, MD Debridement: Debridement Pre-procedure Yes Verification/Time Out Taken: Start Time: 11:50 Pain Control: Lidocaine 4% Topical Solution Level: Skin/Subcutaneous Tissue Total Area Debrided (L x 1.9 (cm) x 2.1 (cm) = 3.99 (cm) W): Tissue and other Viable, Non-Viable, Eschar, Fibrin/Slough, Subcutaneous material debrided: Instrument: Curette Bleeding: Minimum Hemostasis Achieved: Pressure End Time: 11:52 Procedural Pain: 0 Post  Procedural Pain: 0 Response to Treatment: Procedure was tolerated well Post Debridement Measurements of Total Wound Length: (cm) 1.9 Stage: Category/Stage II Width: (cm) 2.1 Depth: (cm) 0.2 Volume: (cm) 0.627 Post Procedure Diagnosis Same as Pre-procedure Electronic Signature(s) Signed: 04/28/2016 4:48:52 PM By: [name] Signed: 04/29/2016 7:57:27 AM By: [name] Najjar MD Entered By: [name] on 04/28/2016 12:08:28 [name] ([id number]) [name], [name] ([id number]) -------------------------------------------------------------------------------- HPI Details [name] Date of Service: 04/28/2016 10:45 AM Patient Name: N. Patient Account Number: [id number] Medical Record Treating RN: [name] [id number] Number: Other Clinician: 10/31/1980 (34 y.o. [name] Date of Birth/Sex: Female) Physician/Extender: G Primary Care [name] Physician: Referring Physician: [name] Wu in Treatment: 22 History of Present Illness Location: dry gangrene both feet and heels Quality: Patient reports No Pain. Severity: Patient states wound are getting worse. Duration: Patient has had the wound for > 4months prior to seeking treatment at the wound center Context: The wound appeared gradually over time Modifying Factors: she has been in and out of hospital over the last 2 months Associated Signs and Symptoms: Patient reports having difficulty standing for long periods. HPI Description: [name] is a 35 y.o. female who presents to our wound center, back in June 2016, referred by her PCP Dr. Zada Finders for nonhealing ulcers on the lateral aspect of the right heel. Of note she has a history of type 1 diabetes mellitus that has been uncontrolled. Past medical history significant for type 1 diabetes mellitus not controlled, ankylosing spondylitis, anorexia nervosa, irritable bowel syndrome, chronic kidney disease, chronic  diarrhea. she then developed gangrene of both feet due to severe peripheral vascular disease and also had gangrene of the tips of her fingers due to upper extremity vascular disease. She was being worked up by vascular surgery at [facility] and at [facility] and has had several procedures done there. She started with hyperbaric oxygen therapy and had a total of 40 treatments the last one being on 06/20/2015. After the initial treatment of hyperbaric oxygen therapy she started having ear problems  and had ultimately to use myringotomy tubes and this was done bilaterally. Since then her ears have been doing fine. In late September, she had seen vascular and hand surgeons. since then she's been in [place] at the rec center for surgery involving extensive vascular procedures for the upper extremities. She was then at [facility] with GI bleeds both upper and lower and has been in and out of hospital for that. She has recently been out of hospital for the last week. 09/09/2015 -- she was unable to get here in time to start her hyperbaric oxygen today and hence is only here for a wound care visit. 09/19/2015 -- she has been having vancomycin during her dialysis and continues to have vascular appointments and the procedure is been set for early January. She has been unable to make it for her hemodialysis due to various medical symptoms. 09/30/2014 -- her vancomycin was stopped on 09/25/2015 and the mother has noticed the right foot has started draining for the last 3 days. Addendum: after examining the patient I was able to talk to her primary vascular surgeon Dr. Pernell Dupre at the [facility]. I told him about the necrotic area on the plantar aspect of right foot which is now wet gangrene and he agreed with me that he would admit her at [facility] under her care and synchronize further treatment. We have discussed her poor prognosis and he and I discussed the need for hospice care [name] [name].  ([id number]) and for sitting down and talking to the patient and her mother and giving them a proper detail of the prognosis. 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 She was worked up with MRIs, abdomen aortogram and placement of left-sided angioplasties were done. After a prolonged hospital she she was discharged home and was told to wear shrinker sock and stump protector and see her surgeon for further instructions regarding wound care and suture removal. Asked to take long-term doxycycline. 01/15/16; this is a patient I haven't seen before although she is been followed by Dr. Meyer Russel in this clinic today. She is a type I diabetic with severe PAD macrovascular disease. She has had a previous BKA. She has dry gangrene of the tips of her fingers which she showed me on the right to. She also has dry gangrene of the left first second and third toes and a portion of her proximal foot around these areas. She is followed by vascular surgery at [facility] and saw them recently they are not going to do surgery as of yet. She has a large black eschar over her heel which is beginning to separate in some areas. As I understand think she is paining these with Betadine. There is been some suggestion about retrying hyperbarics on her although she is still not able to commit to the frequency of treatment that would be necessary to see improvement. She is also on Monday Wednesday Friday dialysis 01/21/16; the patient returns to see me today with regards to the left heel. Apparently she is not scheduled for any further  attempts at revascularization of the left foot. She has dry gangrene of the left medial foot,  first and second toes and there is already some separation developing here. 01/28/16; the patient returns today for attention to the left foot specifically the left calcaneal ulcer. This is covered in a thick black eschar. I crosshatched this last week and we have been using Santyl. 02/05/16; I continue to work on the thick black eschar on the patient's left calcaneus. She is using Santyl that this side crosshatched this area and the eschar is beginning to loosen. 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 This is beginning to separate and liquefy as well especially between the first and second toes. In the time being her major complaint is fatigue at dialysis 02/26/16 I continue to work on the patient's left calcaneus thick adherent black eschar. I crosshatched this area and we have been using Santyl although it is very adherent area I remove some nonviable tissue today what I can see of this actually looks surprisingly good. On the same foot she has dry gangrene on the first and second toes and part of the forefoot underneath this. This is beginning to separate. 03/11/16; the patient comes in for her every 2 week appointment. I have been working on the black eschar on her heel. The patient apparently again has to come off dialysis early today after 2 hours due to severe complaints of nausea. She really does not look well. 04/02/2016 -- the patient has not been seen here for about 3 weeks now and has a new issue with the stump of the right amputation site and also her right posterior thigh. They have only noticed this for the last couple of days. 04/28/16; I had received a call from the patient's surgeon at [facility]. She had a left transmetatarsal amputation and Integra applied to the  left heel. Both of these areas appear to be doing well. There are dressing these with [facility] and they will return to their surgeon on Thursday. She has a new injury on the popliteal fossa on the right which I think was trauma from her stump. Electronic Signature(s) Signed: 04/29/2016 7:57:27 AM By: [name] Najjar MD Entered By: [name] on 04/28/2016 12:10:02 [name] ([id number]) [name], [name] ([id number]) -------------------------------------------------------------------------------- Physical Exam Details [name] Date of Service: 04/28/2016 10:45 AM Patient Name: N. Patient Account Number: [id number] Medical Record Treating RN: [name] [id number] Number: Other Clinician: 1981-07-01 (34 y.o. [name] Date of Birth/Sex: Female) Physician/Extender: G Primary Care [name] Physician: Referring Physician: [name] Wu in Treatment: 34 Notes Wound exam; there is a quarter-sized area in the right popliteal fossa at the superior aspect. This required debridement of surface slough but seems to clean up quite nicely. There was no other open area on the right thigh are stump that I can see. Her left foot transmetatarsal amputation site still has sutures in but looks quite healthy. What I can see of the left heel also looks quite good Electronic Signature(s) Signed: 04/29/2016 7:57:27 AM By: [name] Najjar MD Entered By: [name] on 04/28/2016 12:14:19 [name] ([id number]) -------------------------------------------------------------------------------- Physician Orders Details [name] Date of Service: 04/28/2016 10:45 AM Patient Name: N. Patient Account Number: [id number] Medical Record Treating RN: [name] [id number] Number: Other Clinician: 11-Jan-1981 (34 y.o. [name] Date of Birth/Sex: Female) Physician/Extender: G Primary Care [name] Physician: Referring Physician: [name] Wu in Treatment: 51 Verbal /  Phone Orders: Yes Clinician: Curtis Wu Read Back and Verified: Yes Diagnosis Coding ICD-10 Coding Code Description E10.621 Type 1 diabetes mellitus with foot ulcer E10.52 Type 1 diabetes mellitus with diabetic peripheral angiopathy with gangrene I70.245 Atherosclerosis of native arteries of left leg with ulceration of other part of foot I70.261 Atherosclerosis of native arteries of extremities with gangrene, right leg L89.622 Pressure ulcer of left heel, stage 2 Z89.511 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, right lower extremity S71.101A Unspecified open wound, right thigh, initial encounter Wound Cleansing 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,Posterior Amputation Site - Below Knee o Clean wound with Normal Saline. o Cleanse wound with mild soap and water Wound #17 Left Foot o Clean wound with Normal Saline. o Cleanse wound with mild soap and water Wound #9 Left Calcaneous o Clean wound with Normal Saline. o Cleanse wound with mild soap and water Skin Barriers/Peri-Wound Care 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,Posterior Amputation Site - Below Knee o Skin Prep Wound #17 Left Foot o Skin Prep [name], [name] ([id number]) Wound #9 Left Calcaneous o Skin Prep Primary Wound Dressing Wound #17 Left Foot o RTD Wound #9 Left Calcaneous o RTD 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,Posterior Amputation Site - Below Knee o Santyl Ointment Secondary Dressing 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,Posterior Amputation Site - Below Knee o Boardered Foam Dressing Wound #17 Left Foot o ABD pad o Gauze and Kerlix/Conform Wound #9 Left Calcaneous o ABD pad o Gauze and Kerlix/Conform Dressing Change Frequency 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,Posterior Amputation Site - Below Knee o Change dressing every day. Wound #17 Left Foot o Other: - twice weekly Wound #9 Left Calcaneous o Other: - twice  weekly Follow-up Appointments 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,Posterior Amputation Site - Below Knee o Return Appointment in 2 weeks. o Other: - as patient is able Wound #17 Left Foot o Return Appointment in 2 weeks. o Other: - as patient is able Wound #9 Left Calcaneous o Return Appointment in 2 weeks. o Other: - as patient is able [name], [name] ([id number]) Electronic Signature(s) Signed: 04/28/2016 4:48:52 PM By: [name] Signed: 04/29/2016 7:57:27 AM By: [name] Najjar MD Entered By: [name] on 04/28/2016 12:11:03 [name] ([id number]) -------------------------------------------------------------------------------- Problem List Details [name] Date of Service: 04/28/2016 10:45 AM Patient Name: N. Patient Account Number: [id number] Medical Record Treating RN: [name] [id number] Number: Other Clinician: 1981/01/28 (34 y.o. [name] Date of Birth/Sex: Female) Physician/Extender: G Primary Care [name] Physician: Referring Physician: [name] Wu in Treatment: 41 Active Problems ICD-10 Encounter Code Description Active Date Diagnosis E10.621 Type 1 diabetes mellitus with foot ulcer 08/30/2015 Yes E10.52 Type 1 diabetes mellitus with diabetic peripheral 08/30/2015 Yes angiopathy with gangrene I70.245 Atherosclerosis of native arteries of left leg with ulceration 08/30/2015 Yes of other part of foot I70.261 Atherosclerosis of native arteries of extremities with 08/30/2015 Yes gangrene, right leg L89.622 Pressure ulcer of left heel, stage 2 08/30/2015 Yes Z89.511 Acquired absence of right leg below knee 11/29/2015 Yes T87.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, right lower extremity 04/02/2016 Yes S71.101A Unspecified open wound, right thigh, initial encounter 04/02/2016 Yes Inactive Problems [name], [name] ([id number]) Resolved Problems ICD-10 Code Description Active Date Resolved Date L02.611 Cutaneous  abscess of right foot 10/01/2015 10/01/2015 Electronic Signature(s) Signed: 04/29/2016 7:57:27 AM By: [name] Najjar MD Entered By: [name] on 04/28/2016 12:08:10 [name] ([id number]) -------------------------------------------------------------------------------- Progress Note Details [name] Date of Service: 04/28/2016 10:45 AM Patient Name: N. Patient Account Number: [id number] Medical Record Treating RN: [name] [id number] Number:  Other Clinician: 1980-10-28 (34 y.o. [name] Date of Birth/Sex: Female) Physician/Extender: G Primary Care [name] Physician: Referring Physician: [name] Wu in Treatment: 50 Subjective Chief Complaint Information obtained from Patient Patient in today for treatment of non-healing wound and HBO Treatment. she has just gotten out of hospital this week and is back to resume her hyperbaric oxygen therapy History of Present Illness (HPI) The following HPI elements were documented for the patient's wound: Location: dry gangrene both feet and heels Quality: Patient reports No Pain. Severity: Patient states wound are getting worse. Duration: Patient has had the wound for > 4months prior to seeking treatment at the wound center Context: The wound appeared gradually over time Modifying Factors: she has been in and out of hospital over the last 2 months Associated Signs and Symptoms: Patient reports having difficulty standing for long periods. Kaeden Depaz is a 35 y.o. female who presents to our wound center, back in June 2016, referred by her PCP Dr. Zada Finders for nonhealing ulcers on the lateral aspect of the right heel. Of note she has a history of type 1 diabetes mellitus that has been uncontrolled. Past medical history significant for type 1 diabetes mellitus not controlled, ankylosing spondylitis, anorexia nervosa, irritable bowel syndrome, chronic kidney disease, chronic diarrhea. she  then developed gangrene of both feet due to severe peripheral vascular disease and also had gangrene of the tips of her fingers due to upper extremity vascular disease. She was being worked up by vascular surgery at [facility] and at [facility] and has had several procedures done there. She started with hyperbaric oxygen therapy and had a total of 40 treatments the last one being on 06/20/2015. After the initial treatment of hyperbaric oxygen therapy she started having ear problems and had ultimately to use myringotomy tubes and this was done bilaterally. Since then her ears have been doing fine. In late September, she had seen vascular and hand surgeons. since then she's been in [place] at the rec center for surgery involving extensive vascular procedures for the upper extremities. She was then at [facility] with GI bleeds both upper and lower and has been in and out of hospital for that. She has recently been out of hospital for the last week. 09/09/2015 -- she was unable to get here in time to start her hyperbaric oxygen today and hence is only here for a wound care visit. 09/19/2015 -- she has been having vancomycin during her dialysis and continues to have vascular [name], [name] ([id number]) appointments and the procedure is been set for early January. She has been unable to make it for her hemodialysis due to various medical symptoms. 09/30/2014 -- her vancomycin was stopped on 09/25/2015 and the mother has noticed the right foot has started draining for the last 3 days. Addendum: after examining the patient I was able to talk to her primary vascular surgeon Dr. Pernell Dupre at the [facility]. I told him about the necrotic area on the plantar aspect of right foot which is now wet gangrene and he agreed with me that he would admit her at [facility] under her care and synchronize further treatment. We have discussed her poor prognosis and he and I discussed the need for hospice care and for  sitting down and talking to the patient and her mother and giving them a proper detail of the prognosis. 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 She was worked up with MRIs, abdomen aortogram and placement of left-sided angioplasties were done. After a prolonged hospital she she was discharged home and was told to wear shrinker sock and stump protector and see her surgeon for further instructions regarding wound care and suture removal. Asked to take long-term doxycycline. 01/15/16; this is a patient I haven't seen before although she is been followed by Dr. Meyer Russel in this clinic today. She is a type I diabetic with severe PAD macrovascular disease. She has had a previous BKA. She has dry gangrene of the tips of her fingers which she showed me on the right to. She also has dry gangrene of the left first second and third toes and a portion of her proximal foot around these areas. She is followed by vascular surgery at [facility] and saw them recently they are not going to do surgery as of yet. She has a large black eschar over her heel which is beginning to separate in some areas. As I understand think she is paining these with Betadine. There is been some suggestion about retrying hyperbarics on her although she is still not able to commit to the frequency of treatment that would be necessary to see improvement. She is also on Monday Wednesday Friday dialysis 01/21/16; the patient returns to see me today with regards to the left heel. Apparently she is not scheduled for any further attempts at  revascularization of the left foot. 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 01/28/16; the patient returns today for attention to the left foot specifically the left calcaneal ulcer. This is covered in a thick black eschar. I crosshatched this last week and we have been using Santyl. 02/05/16; I continue to work on the thick black eschar on the patient's left calcaneus. She is using Santyl that this side crosshatched this area and the eschar is beginning to loosen. 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 This is beginning to separate and liquefy as well especially between the first and second toes. In the time being her major complaint is fatigue at dialysis 02/26/16 I continue to work on the patient's left calcaneus thick adherent black eschar. I crosshatched this area and we have been using Santyl although it is very adherent area I remove some nonviable tissue today what I can see of this actually looks surprisingly good. On the same foot she has dry gangrene on the first and second toes and part of the forefoot underneath this. This is beginning to separate. 03/11/16; the patient comes in for her every 2 week appointment. I have been working on the black eschar on her heel. The patient apparently again has to come off dialysis early today after 2 hours due to severe complaints of nausea. She really does not look well. 04/02/2016 -- the patient has not been seen here for about 3 weeks now and has a new issue with the stump of the right amputation site and also her right posterior thigh. They have only noticed this for the last  couple of days. 04/28/16; I had received a call from the patient's surgeon at [facility]. She had a left transmetatarsal amputation [name], [name] ([id number]) and  Integra applied to the left heel. Both of these areas appear to be doing well. There are dressing these with [facility] and they will return to their surgeon on Thursday. She has a new injury on the popliteal fossa on the right which I think was trauma from her stump. Objective Constitutional Vitals Time Taken: 10:56 AM, Height: 68 in, Weight: 96 lbs, BMI: 14.6, Temperature: 97.8 F, Pulse: 83 bpm, Respiratory Rate: 16 breaths/min, Blood Pressure: 77/58 mmHg. Integumentary (Hair, Skin) Wound #12 status is Amputation. Original cause of wound was Gradually Appeared. The wound is located on the Left,Plantar,Circumferential Foot. The wound measures 0cm length x 0cm width x 0cm depth; 0cm^2 area and 0cm^3 volume. Wound #15 status is Healed - Epithelialized. Original cause of wound was Pressure Injury. The wound is located on the Right,Lateral Amputation Site - Below Knee. The wound measures 0cm length x 0cm width x 0cm depth; 0cm^2 area and 0cm^3 volume. Wound #16 status is Open. Original cause of wound was Pressure Injury. 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 The wound measures 1.9cm length x 2.1cm width x 0.1cm depth; 3.134cm^2 area and 0.313cm^3 volume. There is no tunneling or undermining noted. There is a large amount of serosanguineous drainage noted. The wound margin is distinct with the outline attached to the wound base. There is medium (34-66%) red, pink granulation within the wound bed. There is a medium (34-66%) amount of necrotic tissue within the wound bed including Eschar and Adherent Slough. The periwound skin appearance exhibited: Localized Edema, Erythema. The surrounding wound skin color is noted with erythema which is circumferential. Periwound temperature was noted as No Abnormality. The periwound has tenderness on palpation. Wound #17 status is Open. Original cause of wound was Surgical Injury. The wound is located on the Left Foot.  The wound measures 10.7cm length x 0.1cm width x 0.1cm depth; 0.84cm^2 area and 0.084cm^3 volume. The wound is limited to skin breakdown. There is no tunneling or undermining noted. There is a medium amount of serous drainage noted. The wound margin is flat and intact. There is large (67-100%) red granulation within the wound bed. There is no necrotic tissue within the wound bed. The periwound skin appearance exhibited: Moist. The periwound skin appearance did not exhibit: Callus, Crepitus, Excoriation, Fluctuance, Friable, Induration, Localized Edema, Rash, Scarring, Dry/Scaly, Maceration, Atrophie Blanche, Cyanosis, Ecchymosis, Hemosiderin Staining, Mottled, Pallor, Rubor, Erythema. Wound #9 status is Open. Original cause of wound was Pressure Injury. The wound is located on the Left Calcaneous. The wound measures 5cm length x 5.5cm width x 0.1cm depth; 21.598cm^2 area and 2.16cm^3 volume. There is no tunneling or undermining noted. There is a none present amount of drainage noted. The wound margin is flat and intact. There is no granulation within the wound bed. There is no necrotic tissue within the wound bed. 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 There was a Skin/Subcutaneous Tissue Debridement ([id number])  debridement with total area of 3.99 sq cm performed by [name] Caul, MD. with the following instrument(s): Curette to remove Viable and Non-Viable tissue/material including Fibrin/Slough, Eschar, and Subcutaneous after achieving pain control using Lidocaine 4% Topical Solution. A time out was conducted prior to the start of the procedure. A Minimum amount of bleeding was controlled with Pressure. The procedure was tolerated well with a pain level of 0 throughout and a pain level of 0 following the procedure. Post Debridement Measurements: 1.9cm length x 2.1cm width x 0.2cm depth; 0.627cm^3 volume. Post debridement Stage noted as Category/Stage II. Post procedure Diagnosis Wound #16: Same as Pre-Procedure Plan Wound Cleansing: 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 Cleanse wound with mild soap and water Wound #17 Left Foot: Clean wound with Normal Saline. Cleanse wound with mild soap and water Raj, Cheyene N. ([id number]) Wound #9 Left Calcaneous: Clean wound with Normal Saline. Cleanse wound with mild soap and water Skin Barriers/Peri-Wound Care: 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,Posterior Amputation Site - Below Knee: Skin Prep Wound #17 Left Foot: Skin Prep Wound #9 Left Calcaneous: Skin Prep Primary Wound Dressing: Wound #17 Left Foot: RTD Wound #9 Left Calcaneous: RTD 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,Posterior Amputation Site - Below Knee: Santyl Ointment Secondary Dressing: 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,Posterior Amputation Site - Below Knee: Boardered Foam Dressing Wound #17 Left Foot: ABD pad Gauze and Kerlix/Conform Wound #9 Left Calcaneous: ABD pad Gauze and Kerlix/Conform Dressing Change Frequency: 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 Wound #17 Left Foot: Other: - twice weekly Wound #9 Left Calcaneous: Other: - twice weekly Follow-up Appointments: 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,Posterior Amputation Site - Below  Knee: Return Appointment in 2 weeks. Other: - as patient is able Wound #17 Left Foot: Return Appointment in 2 weeks. Other: - as patient is able Wound #9 Left Calcaneous: Return Appointment in 2 weeks. Other: - as patient is able [name], [name] ([id number]) o #1 we added Hydrofera Blue to these surgical Wu on her left foot and heel and keeping with the instructions of her surgeon. #2 to the area in the popliteal fossa on the right we applied Santyl and foam this can be changed daily. #3 she will come back in follow-up in 2 weeks Electronic Signature(s) Signed: 04/29/2016 7:57:27 AM By: [name] Najjar MD Entered By: [name] on 04/28/2016 12:15:47 [name] ([id number]) -------------------------------------------------------------------------------- SuperBill Details [name] Date of Service: 04/28/2016 Patient Name: N. Patient Account Number: [id number] Medical Record Treating RN: [name] [id number] Number: Other Clinician: 1981/01/24 (34 y.o. 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

## 2016-04-29 NOTE — Progress Notes (Signed)
[name], [name] ([id number]) Visit Report for 04/28/2016 Arrival Information Details [name], [name] Date of Service: 04/28/2016 10:45 AM Patient Name: N. Patient Account Number: [id number] Medical Record Treating RN: [name] [id number] Number: Other Clinician: [birth year] (34 y.o. [name] Date of Birth/Sex: Female) Physician/Extender: G Primary Care [name] Physician: Referring Physician: [name] Wu in Treatment: 34 Visit Information History Since Last Visit Added or deleted any medications: No Patient Arrived: Wheel Chair Any new allergies or adverse reactions: No Arrival Time: 10:54 Had a fall or experienced change in No activities of daily living that may affect Accompanied By: mother risk of falls: Transfer Assistance: Manual Signs or symptoms of abuse/neglect since last No Patient Identification Verified: Yes visito Secondary Verification Process Yes Hospitalized since last visit: No Completed: Pain Present Now: No Patient Requires Transmission-Based No Precautions: Patient Has Alerts: Yes Electronic Signature(s) Signed: 04/28/2016 4:48:52 PM By: [name] Entered By: [name] on 04/28/2016 10:56:11 [name] ([id number]) -------------------------------------------------------------------------------- Encounter Discharge Information Details [name] Date of Service: 04/28/2016 10:45 AM Patient Name: N. Patient Account Number: [id number] Medical Record Treating RN: [name] [id number] Number: Other Clinician: [birth year] (34 y.o. [name] Date of Birth/Sex: Female) Physician/Extender: G Primary Care [name] Physician: Referring Physician: [name] Wu in Treatment: 34 Encounter Discharge Information Items Discharge Pain Level: 0 Discharge Condition: Stable Ambulatory Status: Wheelchair Discharge Destination: Home Transportation: Private  Auto Accompanied By: mom Schedule Follow-up Appointment: Yes Medication Reconciliation completed No and provided to Patient/Care [name]: Provided on Clinical Summary of Care: 04/28/2016 Form Type Recipient Paper Patient HB Electronic Signature(s) Signed: 04/28/2016 3:41:49 PM By: [name] Previous Signature: 04/28/2016 12:13:11 PM Version By: [name] Entered By: [name] on 04/28/2016 15:41:49 [name] ([id number]) -------------------------------------------------------------------------------- Multi Wound Chart Details [name] Date of Service: 04/28/2016 10:45 AM Patient Name: N. Patient Account Number: [id number] Medical Record Treating RN: [name] [id number] Number: Other Clinician: 1981-06-21 (34 y.o. 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  ([id number]) % Reduction in Volume: 100.00% 100.00% 47.30% Classification: Grade 4 Category/Stage II Category/Stage II HBO Classification: N/A N/A Grade 1 Exudate Amount: N/A N/A Large Exudate Type: N/A N/A Serosanguineous Exudate Color: N/A N/A red, brown Wound Margin: N/A N/A Distinct, outline attached Granulation Amount: N/A N/A Medium (34-66%) Granulation Quality: N/A N/A Red, Pink Necrotic Amount: N/A N/A Medium (34-66%) Necrotic Tissue: N/A N/A Eschar, Adherent Slough Epithelialization: N/A N/A None Periwound Skin Texture: No Abnormalities Noted No Abnormalities Noted Edema: Yes Periwound Skin No Abnormalities Noted No Abnormalities Noted No Abnormalities Noted Moisture: Periwound Skin Color: No Abnormalities Noted No  Abnormalities Noted Erythema: Yes Erythema Location: N/A N/A Circumferential Temperature: N/A N/A No Abnormality Tenderness on No No Yes Palpation: Wound Preparation: N/A N/A Ulcer Cleansing: Rinsed/Irrigated with Saline Topical Anesthetic Applied: Other: lidocaine 4% Wound Number: 17 9 N/A Photos: N/A Wound Location: Left Foot Left Calcaneous N/A Wounding Event: Surgical Injury Pressure Injury N/A Primary Etiology: Open Surgical Wound Diabetic Wound/Ulcer of N/A the Lower Extremity Comorbid History: Anemia, Type I Diabetes, Anemia, Type I Diabetes, N/A End Stage Renal Disease, End Stage Renal Disease, Rheumatoid Arthritis, Rheumatoid Arthritis, Neuropathy Neuropathy Date Acquired: 04/13/2016 07/28/2015 N/A Weeks of Treatment: 0 34 N/A Wound Status: Open Open N/A Pending Amputation on No No N/A PresentationTIEASHA, [name] ([id number]) Measurements L x W x D 10.7x0.1x0.1 5x5.5x0.1 N/A (cm) Area (cm) : 0.84 21.598 N/A Volume (cm) : 0.084 2.16 N/A % Reduction in Area: 0.00% -16.30% N/A % Reduction in Volume: 0.00% -16.30% N/A Classification: Partial Thickness Grade 2 N/A HBO Classification: Grade 1 N/A N/A Exudate Amount: Medium None  Present N/A Exudate Type: Serous N/A N/A Exudate Color: amber N/A N/A Wound Margin: Flat and Intact Flat and Intact N/A Granulation Amount: Large (67-100%) None Present (0%) N/A Granulation Quality: Red N/A N/A Necrotic Amount: None Present (0%) None Present (0%) N/A Necrotic Tissue: N/A N/A N/A Exposed Structures: Fascia: No N/A N/A Fat: No Tendon: No Muscle: No Joint: No Bone: No Limited to Skin Breakdown Epithelialization: None None N/A Periwound Skin Texture: Edema: No No Abnormalities Noted N/A Excoriation: No Induration: No Callus: No Crepitus: No Fluctuance: No Friable: No Rash: No Scarring: No Periwound Skin Moist: Yes No Abnormalities Noted N/A Moisture: Maceration: No Dry/Scaly: No Periwound Skin Color: Atrophie Blanche: No No Abnormalities Noted N/A Cyanosis: No Ecchymosis: No Erythema: No Hemosiderin Staining: No Mottled: No Pallor: No Rubor: No Erythema Location: N/A N/A N/A Temperature: N/A N/A N/A Tenderness on No No N/A Palpation: Wound Preparation: N/A [name], [name] ([id number]) Ulcer Cleansing: Ulcer Cleansing: Rinsed/Irrigated with Rinsed/Irrigated with Saline Saline Topical Anesthetic Topical Anesthetic Applied: None Applied: None Treatment Notes Electronic Signature(s) Signed: 04/28/2016 4:48:52 PM By: [name] Entered By: [name] on 04/28/2016 11:47:37 [name] ([id number]) -------------------------------------------------------------------------------- Multi-Disciplinary Care Plan Details [name] Date of Service: 04/28/2016 10:45 AM Patient Name: N. Patient Account Number: [id number] Medical Record Treating RN: [name] [id number] Number: Other Clinician: Aug 10, 1981 (34 y.o. [name] Date of Birth/Sex: Female) Physician/Extender: G Primary Care [name] Physician: Referring Physician: [name] Wu in Treatment: 53 Active Inactive HBO Nursing  Diagnoses: Anxiety related to feelings of confinement associated with the hyperbaric oxygen chamber Anxiety related to knowledge deficit of hyperbaric oxygen therapy and treatment procedures Discomfort related to temperature and humidity changes inside hyperbaric chamber Potential for barotraumas to ears, sinuses, teeth, and lungs or cerebral gas embolism related to changes in atmospheric pressure inside hyperbaric oxygen chamber Potential for oxygen toxicity seizures related to delivery of 100% oxygen at an increased atmospheric pressure Potential for pulmonary oxygen toxicity related to delivery of 100% oxygen at an increased atmospheric pressure Goals: Barotrauma will be prevented during HBO2 Date Initiated: 08/30/2015 Goal Status: Active Patient and/or family will be able to state/discuss factors appropriate to the management of their disease process during treatment Date Initiated: 08/30/2015 Goal Status: Active Patient will tolerate the hyperbaric oxygen therapy treatment Date Initiated: 08/30/2015 Goal Status: Active Patient will tolerate the internal climate of the chamber Date Initiated: 08/30/2015 Goal Status: Active Patient/caregiver will verbalize understanding of HBO goals, rationale, procedures and potential hazards Date  Initiated: 08/30/2015 Goal Status: Active Signs and symptoms of pulmonary oxygen toxicity will be recognized and promptly addressed Date Initiated: 08/30/2015 CAITLAND, PORCHIA ([id number]) Goal Status: Active Signs and symptoms of seizure will be recognized and promptly addressed ; seizing patients will suffer no harm Date Initiated: 08/30/2015 Goal Status: Active Interventions: Administer a five (5) minute air break for patient if signs and symptoms of seizure appear and notify the hyperbaric physician Administer a ten (10) minute air break for patient if signs and symptoms of seizure appear and notify the hyperbaric physician Administer  decongestants, per physician orders, prior to HBO2 Administer the correct therapeutic gas delivery based on the patients needs and limitations, per physician order Assess and provide for patientos comfort related to the hyperbaric environment and equalization of middle ear Assess for signs and symptoms related to adverse events, including but not limited to confinement anxiety, pneumothorax, oxygen toxicity and baurotrauma Assess patient for any history of confinement anxiety Assess patient's knowledge and expectations regarding hyperbaric medicine and provide education related to the hyperbaric environment, goals of treatment and prevention of adverse events Implement protocols to decrease risk of pneumothorax in high risk patients Notes: Abuse / Safety / Falls / Self Care Management Nursing Diagnoses: Potential for falls Goals: Patient will remain injury free Date Initiated: 09/19/2015 Goal Status: Active Patient/caregiver will verbalize/demonstrate measures taken to prevent injury and/or falls Date Initiated: 09/19/2015 Goal Status: Active Interventions: Assess fall risk on admission and as needed Assess impairment of mobility on admission and as needed per policy Notes: Orientation to the Wound Care Program Nursing Diagnoses: [name], [name] ([id number]) Knowledge deficit related to the wound healing center program Goals: Patient/caregiver will verbalize understanding of the Wound Healing Center Program Date Initiated: 08/30/2015 Goal Status: Active Interventions: Provide education on orientation to the wound center Notes: Pressure Nursing Diagnoses: Knowledge deficit related to causes and risk factors for pressure ulcer development Knowledge deficit related to management of pressures ulcers Potential for impaired tissue integrity related to pressure, friction, moisture, and shear Goals: Patient will remain free from development of additional pressure ulcers Date  Initiated: 08/30/2015 Goal Status: Active Patient will remain free of pressure ulcers Date Initiated: 08/30/2015 Goal Status: Active Patient/caregiver will verbalize risk factors for pressure ulcer development Date Initiated: 08/30/2015 Goal Status: Active Patient/caregiver will verbalize understanding of pressure ulcer management Date Initiated: 08/30/2015 Goal Status: Active Interventions: Assess: immobility, friction, shearing, incontinence upon admission and as needed Assess offloading mechanisms upon admission and as needed Assess potential for pressure ulcer upon admission and as needed Provide education on pressure ulcers Treatment Activities: Patient referred for home evaluation of offloading devices/mattresses : 01/15/2016 Patient referred for pressure reduction/relief devices : 01/15/2016 Pressure reduction/relief device ordered : 01/15/2016 Notes: [name], [name] ([id number]) Wound/Skin Impairment Nursing Diagnoses: Impaired tissue integrity Knowledge deficit related to ulceration/compromised skin integrity Goals: Patient will have a decrease in wound volume by X% from date: (specify in notes) Date Initiated: 08/30/2015 Goal Status: Active Patient/caregiver will verbalize understanding of skin care regimen Date Initiated: 08/30/2015 Goal Status: Active Ulcer/skin breakdown will have a volume reduction of 30% by week 4 Date Initiated: 08/30/2015 Goal Status: Active Ulcer/skin breakdown will have a volume reduction of 50% by week 8 Date Initiated: 08/30/2015 Goal Status: Active Ulcer/skin breakdown will have a volume reduction of 80% by week 12 Date Initiated: 08/30/2015 Goal Status: Active Ulcer/skin breakdown will heal within 14 weeks Date Initiated: 08/30/2015 Goal Status: Active Interventions: Assess patient/caregiver ability to obtain necessary supplies Assess patient/caregiver ability  to perform ulcer/skin care regimen upon admission and as needed Assess  ulceration(s) every visit Provide education on ulcer and skin care Notes: Electronic Signature(s) Signed: 04/28/2016 4:48:52 PM By: [name] Entered By: [name] on 04/28/2016 11:47:02 [name] ([id number]) -------------------------------------------------------------------------------- Pain Assessment Details [name] Date of Service: 04/28/2016 10:45 AM Patient Name: N. Patient Account Number: [id number] Medical Record Treating RN: [name] [id number] Number: Other Clinician: March 10, 1981 (34 y.o. [name] Date of Birth/Sex: Female) Physician/Extender: G Primary Care [name] Physician: Referring Physician: [name] Wu in Treatment: 105 Active Problems Location of Pain Severity and Description of Pain Patient Has Paino No Site Locations Pain Management and Medication Current Pain Management: Notes Topical or injectable lidocaine is offered to patient for acute pain when surgical debridement is performed. If needed, Patient is instructed to use over the counter pain medication for the following 24-48 hours after debridement. Wound care MDs do not prescribed pain medications. Patient has chronic pain or uncontrolled pain. Patient has been instructed to make an appointment with their Primary Care Physician for pain management. Electronic Signature(s) Signed: 04/28/2016 4:48:52 PM By: [name] Entered By: [name] on 04/28/2016 10:56:20 [name] ([id number]) -------------------------------------------------------------------------------- Patient/Caregiver Education Details [name] Date of Service: 04/28/2016 10:45 AM Patient Name: N. Patient Account Number: [id number] Medical Record Treating RN: [name] [id number] Number: Other Clinician: [birth year] (34 y.o. [name] Date of Birth/Gender: Female) Physician/Extender: G Primary Care Weeks in Treatment:  69 [name] Physician: Referring Physician: Rolin Wu Education Assessment Education Provided To: Caregiver Education Topics Provided Wound/Skin Impairment: Handouts: Other: wound care as ordered Methods: Demonstration, Explain/Verbal Responses: State content correctly Electronic Signature(s) Signed: 04/28/2016 4:48:52 PM By: [name] Entered By: [name] on 04/28/2016 15:45:47 [name] ([id number]) -------------------------------------------------------------------------------- Wound Assessment Details [name] Date of Service: 04/28/2016 10:45 AM Patient Name: N. Patient Account Number: [id number] Medical Record Treating RN: [name] [id number] Number: Other Clinician: June 13, 1981 (34 y.o. [name] Date of Birth/Sex: Female) Physician/Extender: G Primary Care [name] Physician: Referring Physician: [name] Wu in Treatment: 34 Wound Status Wound Number: 12 Primary Diabetic Wound/Ulcer of the Lower Etiology: Extremity Wound Location: Left, Plantar, Circumferential Foot Wound Status: Amputation Wounding Event: Gradually Appeared Outcome Transmetatarsal Level: Date Acquired: 07/28/2015 Weeks Of Treatment: 34 Clustered Wound: No Pending Amputation On Presentation Wound Measurements Length: (cm) 0 % Reduction Width: (cm) 0 % Reduction Depth: (cm) 0 Area: (cm) 0 Volume: (cm) 0 in Area: 100% in Volume: 100% Wound Description Classification: Grade 4 Periwound Skin Texture Texture Color No Abnormalities Noted: No No Abnormalities Noted: No Moisture No Abnormalities Noted: No Electronic Signature(s) Signed: 04/28/2016 4:48:52 PM By: [name] Entered By: [name] on 04/28/2016 11:12:19 [name] ([id number]) -------------------------------------------------------------------------------- Wound Assessment Details [name] Date of Service: 04/28/2016 10:45  AM Patient Name: N. Patient Account Number: [id number] Medical Record Treating RN: [name] [id number] Number: Other Clinician: 05/03/1981 (34 y.o. [name] Date of Birth/Sex: Female) Physician/Extender: G Primary Care [name] Physician: Referring Physician: [name] Wu in Treatment: 34 Wound Status Wound Number: 15 Primary Etiology: Pressure Ulcer Wound Location: Right, Lateral Amputation Site - Wound Status: Healed - Epithelialized Below Knee Wounding Event: Pressure Injury Date Acquired: 04/02/2016 Weeks Of Treatment: 3 Clustered Wound: No Wound Measurements Length: (cm) 0 % Reductio Width: (cm) 0 % Reductio Depth: (cm) 0 Area: (cm) 0 Volume: (cm) 0 n in Area: 100% n in Volume: 100% Wound Description Classification: Category/Stage II Periwound  Skin Texture Texture Color No Abnormalities Noted: No No Abnormalities Noted: No Moisture No Abnormalities Noted: No Electronic Signature(s) Signed: 04/28/2016 4:48:52 PM By: [name] Entered By: [name] on 04/28/2016 11:15:24 [name] ([id number]) -------------------------------------------------------------------------------- Wound Assessment Details [name] Date of Service: 04/28/2016 10:45 AM Patient Name: N. Patient Account Number: [id number] Medical Record Treating RN: [name] [id number] Number: Other Clinician: 04-22-1981 (34 y.o. [name] Date of Birth/Sex: Female) Physician/Extender: G Primary Care [name] Physician: Referring Physician: [name] Wu in Treatment: 34 Wound Status Wound Number: 16 Primary Pressure Ulcer Etiology: Wound Location: Right Amputation Site - Below Knee - Posterior Wound Open Status: Wounding Event: Pressure Injury Comorbid Anemia, Type I Diabetes, End Stage Date Acquired: 04/02/2016 History: Renal Disease, Rheumatoid Arthritis, Weeks Of Treatment:  3 Neuropathy Clustered Wound: No Photos Wound Measurements Length: (cm) 1.9 Width: (cm) 2.1 Depth: (cm) 0.1 Area: (cm) 3.134 Volume: (cm) 0.313 % Reduction in Area: 47.2% % Reduction in Volume: 47.3% Epithelialization: None Tunneling: No Undermining: No Wound Description Classification: Category/Stage II Foul Diabetic Severity (Wagner): Grade 1 Wound Margin: Distinct, outline attached Exudate Amount: Large Exudate Type: Serosanguineous Exudate Color: red, brown Odor After Cleansing: No Wound Bed [name], [name] ([id number]) Granulation Amount: Medium (34-66%) Granulation Quality: Red, Pink Necrotic Amount: Medium (34-66%) Necrotic Quality: Eschar, Adherent Slough Periwound Skin Texture Texture Color No Abnormalities Noted: No No Abnormalities Noted: No Localized Edema: Yes Erythema: Yes Erythema Location: Circumferential Moisture No Abnormalities Noted: No Temperature / Pain Temperature: No Abnormality Tenderness on Palpation: Yes Wound Preparation Ulcer Cleansing: Rinsed/Irrigated with Saline Topical Anesthetic Applied: Other: lidocaine 4%, Treatment Notes 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 Cleansed with: Clean wound with Normal Saline 2. Anesthetic Topical Lidocaine 4% cream to wound bed prior to debridement 4. Dressing Applied: Santyl Ointment 5. Secondary Dressing Applied Bordered Foam Dressing Non-Adherent pad Electronic Signature(s) Signed: 04/28/2016 4:48:52 PM By: [name] Entered By: [name] on 04/28/2016 11:43:09 [name] ([id number]) -------------------------------------------------------------------------------- Wound Assessment Details [name] Date of Service: 04/28/2016 10:45 AM Patient Name: N. Patient Account Number: [id number] Medical Record Treating RN: [name] [id number] Number: Other Clinician: 1981-01-26 (34 y.o. [name] Date of  Birth/Sex: Female) Physician/Extender: G Primary Care [name] Physician: Referring Physician: [name] Wu in Treatment: 34 Wound Status Wound Number: 17 Primary Open Surgical Wound Etiology: Wound Location: Left Foot Wound Open Wounding Event: Surgical Injury Status: Date Acquired: 04/13/2016 Comorbid Anemia, Type I Diabetes, End Stage Weeks Of Treatment: 0 History: Renal Disease, Rheumatoid Arthritis, Clustered Wound: No Neuropathy Photos Wound Measurements Length: (cm) 10.7 Width: (cm) 0.1 Depth: (cm) 0.1 Area: (cm) 0.84 Volume: (cm) 0.084 % Reduction in Area: 0% % Reduction in Volume: 0% Epithelialization: None Tunneling: No Undermining: No Wound Description Classification: Partial Thickness Foul Odor Aft Diabetic Severity [name]): Grade 1 Wound Margin: Flat and Intact Exudate Amount: Medium Exudate Type: Serous Exudate Color: amber er Cleansing: No Wound Bed [name], [name] ([id number]) Granulation Amount: Large (67-100%) Exposed Structure Granulation Quality: Red Fascia Exposed: No Necrotic Amount: None Present (0%) Fat Layer Exposed: No Tendon Exposed: No Muscle Exposed: No Joint Exposed: No Bone Exposed: No Limited to Skin Breakdown Periwound Skin Texture Texture Color No Abnormalities Noted: No No Abnormalities Noted: No Callus: No Atrophie Blanche: No Crepitus: No Cyanosis: No Excoriation: No Ecchymosis: No Fluctuance: No Erythema: No Friable: No Hemosiderin Staining: No Induration: No Mottled: No Localized Edema: No Pallor: No Rash: No Rubor: No Scarring: No Moisture No  Abnormalities Noted: No Dry / Scaly: No Maceration: No Moist: Yes Wound Preparation Ulcer Cleansing: Rinsed/Irrigated with Saline Topical Anesthetic Applied: None Electronic Signature(s) Signed: 04/28/2016 4:48:52 PM By: [name] Entered By: [name] on 04/28/2016 11:45:25 [name]  ([id number]) -------------------------------------------------------------------------------- Wound Assessment Details [name] Date of Service: 04/28/2016 10:45 AM Patient Name: N. Patient Account Number: [id number] Medical Record Treating RN: [name] [id number] Number: Other Clinician: 07-22-1981 (34 y.o. [name] Date of Birth/Sex: Female) Physician/Extender: G Primary Care [name] Physician: Referring Physician: [name] Wu in Treatment: 34 Wound Status Wound Number: 9 Primary Diabetic Wound/Ulcer of the Lower Etiology: Extremity Wound Location: Left Calcaneous Wound Open Wounding Event: Pressure Injury Status: Date Acquired: 07/28/2015 Comorbid Anemia, Type I Diabetes, End Stage Weeks Of Treatment: 34 History: Renal Disease, Rheumatoid Arthritis, Clustered Wound: No Neuropathy Photos Wound Measurements Length: (cm) 5 % Reduction in Area: Width: (cm) 5.5 % Reduction in Volum Depth: (cm) 0.1 Epithelialization: Area: (cm) 21.598 Tunneling: Volume: (cm) 2.16 Undermining: -16.3% e: -16.3% None No No Wound Description Classification: Grade 2 Foul Odor After Clea Wound Margin: Flat and Intact Exudate Amount: None Present nsing: No Wound Bed Granulation Amount: None Present (0%) Necrotic Amount: None Present (0%) Periwound Skin Texture [name], [name] N. ([id number]) Texture Color No Abnormalities Noted: No No Abnormalities Noted: No Moisture No Abnormalities Noted: No Wound Preparation Ulcer Cleansing: Rinsed/Irrigated with Saline Topical Anesthetic Applied: None Treatment Notes Wound #9 (Left Calcaneous) 1. Cleansed with: Clean wound with Normal Saline 4. Dressing Applied: Other dressing (specify in notes) 5. Secondary Dressing Applied Guaze, ABD and kerlix/Conform 7. Secured with Tape Notes RTD Electronic Signature(s) Signed: 04/28/2016 4:48:52 PM By: [name] Entered By: [name]  on 04/28/2016 11:46:33 [name] ([id number]) -------------------------------------------------------------------------------- Vitals Details [name] Date of Service: 04/28/2016 10:45 AM Patient Name: N. Patient Account Number: [id number] Medical Record Treating RN: [name] [id number] Number: Other Clinician: 16-Jan-1981 (34 y.o. [name] Date of Birth/Sex: Female) Physician/Extender: G Primary Care [name] Physician: Referring Physician: [name] Wu in Treatment: 34 Vital Signs Time Taken: 10:56 Temperature (F): 97.8 Height (in): 68 Pulse (bpm): 83 Weight (lbs): 96 Respiratory Rate (breaths/min): 16 Body Mass Index (BMI): 14.6 Blood Pressure (mmHg): 77/58 Reference Range: 80 - 120 mg / dl Electronic Signature(s) Signed: 04/28/2016 4:48:52 PM By: [name] Entered By: [name] on 04/28/2016 10:57:19

## 2016-05-12 ENCOUNTER — Ambulatory Visit: Payer: Medicare Other | Admitting: Surgery

## 2016-05-14 ENCOUNTER — Ambulatory Visit: Payer: Medicare Other | Admitting: General Surgery

## 2016-05-15 ENCOUNTER — Ambulatory Visit: Payer: Medicare Other | Admitting: General Surgery

## 2016-05-20 ENCOUNTER — Encounter: Payer: Medicare Other | Admitting: Internal Medicine

## 2016-05-20 DIAGNOSIS — E10621 Type 1 diabetes mellitus with foot ulcer: Secondary | ICD-10-CM | POA: Diagnosis not present

## 2016-05-21 NOTE — Progress Notes (Signed)
GARA, WAHLS ([id number]) Visit Report for 05/20/2016 Arrival Information Details [name], Wu Date of Service: 05/20/2016 3:45 PM Patient Name: N. Patient Account Number: [id number] Medical Record Treating RN: [name] [id number] Number: Other Clinician: 31-Mar-1981 (35 y.o. Treating [name], Wu Date of Birth/Sex: Female) Physician/Extender: G Primary Care [name] Physician: Referring Physician: [name] in Treatment: 37 Visit Information History Since Last Visit Added or deleted any medications: No Patient Arrived: Wheel Chair Any new allergies or adverse reactions: No Arrival Time: 16:07 Had a fall or experienced change in No activities of daily living that may affect Accompanied By: mom risk of falls: Transfer Assistance: None Signs or symptoms of abuse/neglect since last No Patient Identification Verified: Yes visito Secondary Verification Process Yes Hospitalized since last visit: No Completed: Pain Present Now: No Patient Requires Transmission-Based No Precautions: Patient Has Alerts: Yes Electronic Signature(s) Signed: 05/20/2016 5:18:20 PM By: [name] Entered By: [name] on 05/20/2016 16:11:20 [name] ([id number]) -------------------------------------------------------------------------------- Clinic Level of Care Assessment Details Shanon Wu Date of Service: 05/20/2016 3:45 PM Patient Name: N. Patient Account Number: [id number] Medical Record Treating RN: [name] [id number] Number: Other Clinician: July 25, 1981 (35 y.o. Treating [name], Wu Date of Birth/Sex: Female) Physician/Extender: G Primary Care [name] Physician: Referring Physician: [name] in Treatment: 37 Clinic Level of Care Assessment Items TOOL 4 Quantity Score []  - Use when only an EandM is performed on FOLLOW-UP visit 0 ASSESSMENTS - Nursing Assessment / Reassessment X - Reassessment of  Co-morbidities (includes updates in patient status) 1 10 X - Reassessment of Adherence to Treatment Plan 1 5 ASSESSMENTS - Wound and Skin Assessment / Reassessment X - Simple Wound Assessment / Reassessment - one wound 1 5 []  - Complex Wound Assessment / Reassessment - multiple wounds 0 []  - Dermatologic / Skin Assessment (not related to wound area) 0 ASSESSMENTS - Focused Assessment []  - Circumferential Edema Measurements - multi extremities 0 []  - Nutritional Assessment / Counseling / Intervention 0 []  - Lower Extremity Assessment (monofilament, tuning fork, pulses) 0 []  - Peripheral Arterial Disease Assessment (using hand held doppler) 0 ASSESSMENTS - Ostomy and/or Continence Assessment and Care []  - Incontinence Assessment and Management 0 []  - Ostomy Care Assessment and Management (repouching, etc.) 0 PROCESS - Coordination of Care X - Simple Patient / Family Education for ongoing care 1 15 []  - Complex (extensive) Patient / Family Education for ongoing care 0 X - Staff obtains Consents, Records, Test Results / Process Orders 1 10 [name], [name] ([id number]) []  - Staff telephones HHA, Nursing Homes / Clarify orders / etc 0 []  - Routine Transfer to another Facility (non-emergent condition) 0 []  - Routine Hospital Admission (non-emergent condition) 0 []  - New Admissions / Manufacturing engineer / Ordering NPWT, Apligraf, etc. 0 []  - Emergency Hospital Admission (emergent condition) 0 X - Simple Discharge Coordination 1 10 []  - Complex (extensive) Discharge Coordination 0 PROCESS - Special Needs []  - Pediatric / Minor Patient Management 0 []  - Isolation Patient Management 0 []  - Hearing / Language / Visual special needs 0 []  - Assessment of Community assistance (transportation, D/C planning, etc.) 0 []  - Additional assistance / Altered mentation 0 []  - Support Surface(s) Assessment (bed, cushion, seat, etc.) 0 INTERVENTIONS - Wound Cleansing / Measurement X - Simple Wound  Cleansing - one wound 1 5 []  - Complex Wound Cleansing - multiple wounds 0 X - Wound Imaging (photographs - any number of wounds) 1 5 []  - Wound Tracing (instead of  photographs) 0 X - Simple Wound Measurement - one wound 1 5 []  - Complex Wound Measurement - multiple wounds 0 INTERVENTIONS - Wound Dressings X - Small Wound Dressing one or multiple wounds 1 10 []  - Medium Wound Dressing one or multiple wounds 0 []  - Large Wound Dressing one or multiple wounds 0 []  - Application of Medications - topical 0 Shawgo, Nikky N. ( ) []  - Application of Medications - injection 0 INTERVENTIONS - Miscellaneous []  - External ear exam 0 []  - Specimen Collection (cultures, biopsies, blood, body fluids, etc.) 0 []  - Specimen(s) / Culture(s) sent or taken to Lab for analysis 0 []  - Patient Transfer (multiple staff / / Similar devices) 0 []  - Simple Staple / Suture removal (25 or less) 0 []  - Complex Staple / Suture removal (26 or more) 0 []  - Hypo / Hyperglycemic Management (close monitor of Blood Glucose) 0 []  - Ankle / Brachial Index (ABI) - do not check if billed separately 0 X - Vital Signs 1 5 Has the patient been seen at the hospital within the last three years: Yes Total Score: 85 Level Of Care: New/Established - Level 3 Electronic Signature(s) Signed: 05/20/2016 5:18:20 PM By: Entered By: [id number] on 05/20/2016 16:55:41 ( ) -------------------------------------------------------------------------------- Encounter Discharge Information Details Date of Service: 05/20/2016 3:45 PM Patient Name: N. Patient Account Number: Nurse, adult Medical Record Treating RN:  Number: Other Clinician: [birth year] (35 y.o. Treating [name], Wu Date of Birth/Sex: Female) Physician/Extender: G Primary Care Physician: Referring Physician: 05/22/2016 in Treatment:  37 Encounter Discharge Information Items Discharge Pain Level: 0 Discharge Condition: Stable Ambulatory Status: Wheelchair Discharge Destination: Home Transportation: Private Auto Accompanied By: mom Schedule Follow-up Appointment: Yes Medication Reconciliation completed and provided to Patient/Care No Esley Brooking: Provided on Clinical Summary of Care: 05/20/2016 Form Type Recipient Paper Patient HB Electronic Signature(s) Signed: 05/20/2016 5:16:41 PM By: [name] Previous Signature: 05/20/2016 4:55:11 PM Version By: Shanon Wu Entered By: 05/22/2016 on 05/20/2016 17:16:41 [name] ([id number]) -------------------------------------------------------------------------------- Multi Wound Chart Details 09/09/1981 Date of Service: 05/20/2016 3:45 PM Patient Name: N. Patient Account Number: [name] Medical Record Treating RN: 38 05/22/2016 Number: Other Clinician: [birth year] (34 y.o. 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 ([name]) Exudate Amount: Large N/A N/A Exudate Type: Serosanguineous N/A N/A Exudate Color: red, brown N/A N/A Wound Margin: Distinct, outline attached N/A N/A Granulation Amount: Small (1-33%) N/A N/A Granulation Quality: Red, Pink N/A N/A Necrotic Amount: Large (67-100%) N/A N/A Necrotic Tissue: Eschar, Adherent Slough N/A N/A Epithelialization: None N/A N/A Periwound Skin Texture: Edema: Yes N/A N/A Periwound  Skin No Abnormalities Noted N/A N/A Moisture: Periwound Skin Color: Erythema: Yes N/A N/A Erythema Location: Circumferential N/A N/A Temperature: No Abnormality N/A N/A Tenderness on Yes N/A N/A Palpation: Wound Preparation: Ulcer Cleansing: N/A N/A Rinsed/Irrigated with Saline Topical Anesthetic Applied: Other: lidocaine 4% Treatment Notes Electronic Signature(s) Signed: 05/20/2016 5:18:20 PM By: [name] Entered By: [name] on 05/20/2016 16:20:47 [name] ([id number]) -------------------------------------------------------------------------------- Multi-Disciplinary Care Plan Details Shanon Wu Date of Service: 05/20/2016 3:45 PM Patient Name: N. Patient Account Number: [id number] Medical Record Treating RN: [name] [id number] Number: Other Clinician: January 13, 1981 (34 y.o. Treating [name], Wu Date of Birth/Sex: Female) Physician/Extender: G Primary Care [name] Physician: Referring Physician: [name] in Treatment: 37 Active Inactive HBO Nursing Diagnoses: Anxiety related to feelings of confinement associated with the hyperbaric oxygen chamber Anxiety related to knowledge deficit of hyperbaric oxygen therapy and treatment procedures Discomfort related to temperature and humidity changes inside hyperbaric chamber Potential for barotraumas to ears, sinuses, teeth, and lungs or cerebral gas embolism related to changes in atmospheric pressure inside hyperbaric  oxygen chamber Potential for oxygen toxicity seizures related to delivery of 100% oxygen at an increased atmospheric pressure Potential for pulmonary oxygen toxicity related to delivery of 100% oxygen at an increased atmospheric pressure Goals: Barotrauma will be prevented during HBO2 Date Initiated: 08/30/2015 Goal Status: Active Patient and/or family will be able to state/discuss factors appropriate to the management of their disease process during treatment Date Initiated: 08/30/2015 Goal Status: Active Patient will tolerate the hyperbaric oxygen therapy treatment Date Initiated: 08/30/2015 Goal Status: Active Patient will tolerate the internal climate of the chamber Date Initiated: 08/30/2015 Goal Status: Active Patient/caregiver will verbalize understanding of HBO goals, rationale, procedures and potential hazards Date Initiated: 08/30/2015 Goal Status: Active Signs and symptoms of pulmonary oxygen toxicity will be recognized and promptly addressed Date Initiated: 08/30/2015 [name] ([id number]) Goal Status: Active Signs and symptoms of seizure will be recognized and promptly addressed ; seizing patients will suffer no harm Date Initiated: 08/30/2015 Goal Status: Active Interventions: Administer a five (5) minute air break for patient if signs and symptoms of seizure appear and notify the hyperbaric physician Administer a ten (10) minute air break for patient if signs and symptoms of seizure appear and notify the hyperbaric physician Administer decongestants, per physician orders, prior to HBO2 Administer the correct therapeutic gas delivery based on the patients needs and limitations, per physician order Assess and provide for patientos comfort related to the hyperbaric environment and equalization of middle ear Assess for signs and symptoms related to adverse events, including but not limited to confinement anxiety, pneumothorax, oxygen toxicity and  baurotrauma Assess patient for any history of confinement anxiety Assess patient's knowledge and expectations regarding hyperbaric medicine and provide education related to the hyperbaric environment, goals of treatment and prevention of adverse events Implement protocols to decrease risk of pneumothorax in high risk patients Notes: Abuse / Safety / Falls / Self Care Management Nursing Diagnoses: Potential for falls Goals: Patient will remain injury free Date Initiated: 09/19/2015 Goal Status: Active Patient/caregiver will verbalize/demonstrate measures taken to prevent injury and/or falls Date Initiated: 09/19/2015 Goal Status: Active Interventions: Assess fall risk on admission and as needed Assess impairment of mobility on admission and as needed per policy Notes: Orientation to the Wound Care Program Nursing Diagnoses: ARHIANNA, EBEY ([id number]) Knowledge deficit related to the wound healing center program Goals: Patient/caregiver will verbalize understanding of the Wound Healing Center Program Date Initiated: 08/30/2015 Goal Status: Active Interventions:  Provide education on orientation to the wound center Notes: Pressure Nursing Diagnoses: Knowledge deficit related to causes and risk factors for pressure ulcer development Knowledge deficit related to management of pressures ulcers Potential for impaired tissue integrity related to pressure, friction, moisture, and shear Goals: Patient will remain free from development of additional pressure ulcers Date Initiated: 08/30/2015 Goal Status: Active Patient will remain free of pressure ulcers Date Initiated: 08/30/2015 Goal Status: Active Patient/caregiver will verbalize risk factors for pressure ulcer development Date Initiated: 08/30/2015 Goal Status: Active Patient/caregiver will verbalize understanding of pressure ulcer management Date Initiated: 08/30/2015 Goal Status: Active Interventions: Assess: immobility,  friction, shearing, incontinence upon admission and as needed Assess offloading mechanisms upon admission and as needed Assess potential for pressure ulcer upon admission and as needed Provide education on pressure ulcers Treatment Activities: Patient referred for home evaluation of offloading devices/mattresses : 01/15/2016 Patient referred for pressure reduction/relief devices : 01/15/2016 Pressure reduction/relief device ordered : 01/15/2016 Notes: LOTA, PERLMAN ([id number]) Wound/Skin Impairment Nursing Diagnoses: Impaired tissue integrity Knowledge deficit related to ulceration/compromised skin integrity Goals: Patient will have a decrease in wound volume by X% from date: (specify in notes) Date Initiated: 08/30/2015 Goal Status: Active Patient/caregiver will verbalize understanding of skin care regimen Date Initiated: 08/30/2015 Goal Status: Active Ulcer/skin breakdown will have a volume reduction of 30% by week 4 Date Initiated: 08/30/2015 Goal Status: Active Ulcer/skin breakdown will have a volume reduction of 50% by week 8 Date Initiated: 08/30/2015 Goal Status: Active Ulcer/skin breakdown will have a volume reduction of 80% by week 12 Date Initiated: 08/30/2015 Goal Status: Active Ulcer/skin breakdown will heal within 14 weeks Date Initiated: 08/30/2015 Goal Status: Active Interventions: Assess patient/caregiver ability to obtain necessary supplies Assess patient/caregiver ability to perform ulcer/skin care regimen upon admission and as needed Assess ulceration(s) every visit Provide education on ulcer and skin care Notes: Electronic Signature(s) Signed: 05/20/2016 5:18:20 PM By: [name] Entered By: [name] on 05/20/2016 16:19:50 [name] ([id number]) -------------------------------------------------------------------------------- Patient/Caregiver Education Details Shanon Wu Date of Service: 05/20/2016 3:45 PM Patient Name: N.  Patient Account Number: [id number] Medical Record Treating RN: [name] [id number] Number: Other Clinician: Nov 13, 1980 (34 y.o. Treating [name], Wu Date of Birth/Gender: Female) Physician/Extender: G Primary Care Weeks in Treatment: 77 [name] Physician: Referring Physician: [name] Education Assessment Education Provided To: Caregiver Education Topics Provided Wound/Skin Impairment: Handouts: Other: wound care as ordered Methods: Demonstration, Explain/Verbal Responses: State content correctly Electronic Signature(s) Signed: 05/20/2016 5:18:20 PM By: [name] Entered By: [name] on 05/20/2016 16:28:02 [name] ([id number]) -------------------------------------------------------------------------------- Wound Assessment Details Shanon Wu Date of Service: 05/20/2016 3:45 PM Patient Name: N. Patient Account Number: [id number] Medical Record Treating RN: [name] [id number] Number: Other Clinician: 1980-11-28 (34 y.o. 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 Wound Open Status: Wounding Event: Pressure Injury Comorbid Anemia, Type I Diabetes, End Stage Date Acquired: 04/02/2016 History: Renal Disease, Rheumatoid Arthritis, Weeks Of Treatment: 6 Neuropathy Clustered Wound: No Photos Wound Measurements Length: (cm) 2.8 Width: (cm) 2.8 Depth: (cm) 0.1 Area: (cm) 6.158 Volume: (cm) 0.616 % Reduction in Area: -3.7% % Reduction in Volume: -3.7% Epithelialization: None Tunneling: No Undermining: No Wound Description Classification: Category/Stage III Foul Diabetic Severity (Wagner): Grade 1 Wound Margin: Distinct, outline attached Exudate Amount:  Large Exudate Type: Serosanguineous Exudate Color:  red, brown Odor After Cleansing: No Wound Bed Torry, Safia N. ([id number]) Granulation Amount: Small (1-33%) Granulation Quality: Red, Pink Necrotic Amount: Large (67-100%) Necrotic Quality: Eschar, Adherent Slough Periwound Skin Texture Texture Color No Abnormalities Noted: No No Abnormalities Noted: No Localized Edema: Yes Erythema: Yes Erythema Location: Circumferential Moisture No Abnormalities Noted: No Temperature / Pain Temperature: No Abnormality Tenderness on Palpation: Yes Wound Preparation Ulcer Cleansing: Rinsed/Irrigated with Saline Topical Anesthetic Applied: Other: lidocaine 4%, Treatment Notes 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 Cleansed with: Clean wound with Normal Saline 2. Anesthetic Topical Lidocaine 4% cream to wound bed prior to debridement 4. Dressing Applied: Santyl Ointment 5. Secondary Dressing Applied Bordered Foam Dressing Dry Gauze Electronic Signature(s) Signed: 05/20/2016 5:18:20 PM By: Curtis Sitesorthy, Joanna Entered By: Curtis Sitesorthy, Joanna on 05/20/2016 16:19:28 Diana EvesBLACKBURN, Taffie N. ([id number]) -------------------------------------------------------------------------------- Vitals Details Shanon PayorBLACKBURN, Myldred Date of Service: 05/20/2016 3:45 PM Patient Name: N. Patient Account Number: [id number] Medical Record Treating RN: Curtis SitesDorthy, Joanna [id number] Number: Other Clinician: [birth year] (34 y.o. Treating [name], Wu Date of Birth/Sex: Female) Physician/Extender: G Primary Care Rolin Barrylmedo, Mario Physician: Referring Physician: [name] Partridgelmedo, Mario Weeks in Treatment: 37 Vital Signs Time Taken: 16:16 Pulse (bpm): 78 Height (in): 68 Respiratory Rate (breaths/min): 16 Weight (lbs): 96 Blood Pressure (mmHg): 82/54 Body Mass Index (BMI): 14.6 Reference Range: 80 - 120 mg / dl Electronic Signature(s) Signed: 05/20/2016 5:18:20 PM By: Curtis Sitesorthy, Joanna Entered By: Curtis Sitesorthy,  Joanna on 05/20/2016 16:16:17

## 2016-05-21 NOTE — Progress Notes (Signed)
LIBBI, TOWNER ([id number]) Visit Report for 05/20/2016 Chief Complaint Document Details [name], Wu Date of Service: 05/20/2016 3:45 PM Patient Name: N. Patient Account Number: [id number] Medical Record Treating RN: [name] [id number] Number: Other Clinician: 07-04-1981 (34 y.o. Treating Jakala Herford Date of Birth/Sex: Female) Physician/Extender: G Primary Care [name] Physician: Referring Physician: [name] in Treatment: 37 Information Obtained from: Patient Chief Complaint Patient in today for treatment of non-healing wound and HBO Treatment. she has just gotten out of hospital this week and is back to resume her hyperbaric oxygen therapy Electronic Signature(s) Signed: 05/20/2016 5:34:52 PM By: Baltazar Najjar MD Entered By: Baltazar Najjar on 05/20/2016 17:24:51 [name] ([id number]) -------------------------------------------------------------------------------- HPI Details [name] Date of Service: 05/20/2016 3:45 PM Patient Name: N. Patient Account Number: [id number] Medical Record Treating RN: [name] [id number] Number: Other Clinician: March 27, 1981 (34 y.o. Treating [name] Date of Birth/Sex: Female) Physician/Extender: G Primary Care [name] Physician: Referring Physician: [name] in Treatment: 37 History of Present Illness Location: dry gangrene both feet and heels Quality: Patient reports No Pain. Severity: Patient states wound are getting worse. Duration: Patient has had the wound for > 4months prior to seeking treatment at the wound center Context: The wound appeared gradually over time Modifying Factors: she has been in and out of hospital over the last 2 months Associated Signs and Symptoms: Patient reports having difficulty standing for long periods. HPI Description: [name] is a 35 y.o. female who presents to our wound center, back in June 2016,  referred by her PCP Dr. Zada Finders for nonhealing ulcers on the lateral aspect of the right heel. Of note she has a history of type 1 diabetes mellitus that has been uncontrolled. Past medical history significant for type 1 diabetes mellitus not controlled, ankylosing spondylitis, anorexia nervosa, irritable bowel syndrome, chronic kidney disease, chronic diarrhea. she then developed gangrene of both feet due to severe peripheral vascular disease and also had gangrene of the tips of her fingers due to upper extremity vascular disease. She was being worked up by vascular surgery at [facility] and at [facility] and has had several procedures done there. She started with hyperbaric oxygen therapy and had a total of 40 treatments the last one being on 06/20/2015. After the initial treatment of hyperbaric oxygen therapy she started having ear problems and had ultimately to use myringotomy tubes and this was done bilaterally. Since then her ears have been doing fine. In late September, she had seen vascular and hand surgeons. since then she's been in [place] at the rec center for surgery involving extensive vascular procedures for the upper extremities. She was then at [facility] with GI bleeds both upper and lower and has been in and out of hospital for that. She has recently been out of hospital for the last week. 09/09/2015 -- she was unable to get here in time to start her hyperbaric oxygen today and hence is only here for a wound care visit. 09/19/2015 -- she has been having vancomycin during her dialysis and continues to have vascular appointments and the procedure is been set for early January. She has been unable to make it for her hemodialysis due to various medical symptoms. 09/30/2014 -- her vancomycin was stopped on 09/25/2015 and the mother has noticed the right foot has started draining for the last 3 days. Addendum: after examining the patient I was able to talk to her primary vascular surgeon Dr.  Pernell Dupre at the [facility]. I told him  about the necrotic area on the plantar aspect of right foot which is now wet gangrene and he agreed with me that he would admit her at [facility] under her care and synchronize further treatment. We have discussed her poor prognosis and he and I discussed the need for hospice care [facility], ELAYA HOSTEEN. ([id number]) and for sitting down and talking to the patient and her mother and giving them a proper detail of the prognosis. 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 She was worked up with MRIs, abdomen aortogram and placement of left-sided angioplasties were done. After a prolonged hospital she she was discharged home and was told to wear shrinker sock and stump protector and see her surgeon for further instructions regarding wound care and suture removal. Asked to take long-term doxycycline. 01/15/16; this is a patient I haven't seen before although she is been followed by Dr. Meyer Russel in this clinic today. She is a type I diabetic with severe PAD macrovascular disease. She has had a previous BKA. She has dry gangrene of the tips of her fingers which she showed me on the right to. She also has dry gangrene of the left first second and third toes and a portion of her proximal foot around these areas. She is followed by vascular surgery at [facility] and saw them recently they are not going to do surgery as of yet. She has a large black eschar over her heel which is beginning to separate in some areas. As I understand think she is paining these with Betadine. There is been  some suggestion about retrying hyperbarics on her although she is still not able to commit to the frequency of treatment that would be necessary to see improvement. She is also on Monday Wednesday Friday dialysis 01/21/16; the patient returns to see me today with regards to the left heel. Apparently she is not scheduled for any further attempts at revascularization of the left foot. 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 01/28/16; the patient returns today for attention to the left foot specifically the left calcaneal ulcer. This is covered in a thick black eschar. I crosshatched this last week and we have been using Santyl. 02/05/16; I continue to work on the thick black eschar on the patient's left calcaneus. She is using Santyl that this side crosshatched this area and the eschar is beginning to loosen. 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 This is beginning to separate and liquefy as well especially between the first and second toes. In the time being her major complaint is fatigue at dialysis 02/26/16 I continue to work on the patient's left calcaneus thick adherent black eschar. I crosshatched this area and we have been using Santyl although it is very adherent area I remove some nonviable tissue today what I can see of this actually looks surprisingly good. On the same foot she has dry gangrene on the first and second toes and part  of the forefoot underneath this. This is beginning to separate. 03/11/16; the patient comes in for her every 2 week appointment. I have been working on the black eschar on her heel. The patient apparently again has to come off dialysis early today after 2 hours due to severe complaints of nausea. She really does not look well. 04/02/2016 -- the patient  has not been seen here for about 3 weeks now and has a new issue with the stump of the right amputation site and also her right posterior thigh. They have only noticed this for the last couple of days. 04/28/16; I had received a call from the patient's surgeon at [facility]. She had a left transmetatarsal amputation and Integra applied to the left heel. Both of these areas appear to be doing well. There are dressing these with [facility] and they will return to their surgeon on Thursday. She has a new injury on the popliteal fossa on the right which I think was trauma from her stump. 05/20/16; the patient is following with her surgeon at [facility]. She's had a left transmetatarsal debridement of the left heel she had Integra place and apparently is using some consternation of Epson salts soaks, Betadine and Aquacel Ag. The left leg is wrapped I did not look at this today. She has the wound in her right popliteal fossa which is apparently a pressure area possibly related to sitting on a toilet for 2-3 hours multiple times a day. She has chronic diarrhea which is been thoroughly investigated felt to be secondary to [facility] Ivalee N. ([id number]) diabetic autonomic neuropathy. We have been using Santyl to the area on the right popliteal fossa. She has a new wound on her right gluteal area but the patient would not let me look at that today Electronic Signature(s) Signed: 05/20/2016 5:34:52 PM By: Baltazar Najjarobson, Odalys Win MD Entered By: Baltazar Najjarobson, Yissel Habermehl on 05/20/2016 17:27:10 Diana EvesBLACKBURN, Anique N. ([id number]) -------------------------------------------------------------------------------- Physical Exam Details Shanon PayorBLACKBURN, Bintou Date of Service: 05/20/2016 3:45 PM Patient Name: N. Patient Account Number: [id number] Medical Record Treating RN: Curtis SitesDorthy, Joanna [id number] Number: Other Clinician: 1980-11-06 (34 y.o. Treating Shuntae Herzig Date of Birth/Sex: Female) Physician/Extender: G Primary Care Rolin Barrylmedo,  Mario Physician: Referring Physician: [name] Partridgelmedo, Mario Weeks in Treatment: 37 Constitutional Patient is hypotensive. The patient is just out of dialysis. Looks fatigued. Pulse regular and within target range for patient.Marland Kitchen. Respirations regular, non-labored and within target range.. Temperature is normal and within the target range for the patient.. Notes Wound exam; the patient has a quarter-sized area in the right popliteal fossa superior aspect. This as rolled edges and that adherent slough surface. I did not attempt to debridement this today. It will definitely need to continue Santyl and may need mechanical debridement in the future. She would not let me look at the right gluteal area. I did not remove the dressings on her left foot Electronic Signature(s) Signed: 05/20/2016 5:34:52 PM By: Baltazar Najjarobson, Darely Becknell MD Entered By: Baltazar Najjarobson, Drayk Humbarger on 05/20/2016 17:28:56 Diana EvesBLACKBURN, Lurlean N. ([id number]) -------------------------------------------------------------------------------- Physician Orders Details Shanon PayorBLACKBURN, Macon Date of Service: 05/20/2016 3:45 PM Patient Name: N. Patient Account Number: [id number] Medical Record Treating RN: Curtis SitesDorthy, Joanna [id number] Number: Other Clinician: 1980-11-06 (34 y.o. Treating Johnte Portnoy Date of Birth/Sex: Female) Physician/Extender: G Primary Care Rolin Barrylmedo, Mario Physician: Referring Physician: [name] Partridgelmedo, Mario Weeks in Treatment: 837 Verbal / Phone Orders: Yes Clinician: Curtis Sitesorthy, Joanna Read Back and Verified: Yes Diagnosis Coding Wound Cleansing 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,Posterior Amputation Site - Below Knee o Clean wound with Normal Saline. o Cleanse wound  with mild soap and water Skin Barriers/Peri-Wound Care 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,Posterior Amputation Site - Below Knee o Skin Prep Primary Wound Dressing 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,Posterior Amputation Site - Below Knee o Santyl Ointment Secondary Dressing 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,Posterior Amputation Site - Below  Knee o Boardered Foam Dressing Dressing Change Frequency 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,Posterior Amputation Site - Below Knee o Change dressing every day. Follow-up Appointments 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,Posterior Amputation Site - Below Knee o Return Appointment in 2 weeks. o Other: - as patient is able Electronic Signature(s) Signed: 05/20/2016 5:18:20 PM By: [name] Signed: 05/20/2016 5:34:52 PM By: Baltazar Najjar MD Entered By: [name] on 05/20/2016 16:54:04 [name] ([id number]) Foley, Ledell Peoples ([id number]) -------------------------------------------------------------------------------- Problem List Details [name] Date of Service: 05/20/2016 3:45 PM Patient Name: N. Patient Account Number: [id number] Medical Record Treating RN: [name] [id number] Number: Other Clinician: 1981-03-15 (34 y.o. Treating Jkai Arwood Date of Birth/Sex: Female) Physician/Extender: G Primary Care [name] Physician: Referring Physician: [name] in Treatment: 37 Active Problems ICD-10 Encounter Code Description Active Date Diagnosis E10.621 Type 1 diabetes mellitus with foot ulcer 08/30/2015 Yes E10.52 Type 1 diabetes mellitus with diabetic peripheral 08/30/2015 Yes angiopathy with gangrene I70.245 Atherosclerosis of native arteries of left leg with ulceration 08/30/2015 Yes of other part of foot I70.261 Atherosclerosis of native arteries of extremities with 08/30/2015 Yes gangrene, right leg L89.622 Pressure ulcer of left heel, stage 2 08/30/2015 Yes Z89.511 Acquired absence of right leg below knee 11/29/2015 Yes T87.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, right lower extremity 04/02/2016 Yes S71.101A Unspecified open wound, right thigh, initial encounter 04/02/2016 Yes Inactive Problems JERALINE, DOOLIN ([id number]) Resolved Problems ICD-10 Code Description Active Date Resolved Date L02.611 Cutaneous abscess of right foot 10/01/2015  10/01/2015 Electronic Signature(s) Signed: 05/20/2016 5:34:52 PM By: Baltazar Najjar MD Entered By: Baltazar Najjar on 05/20/2016 17:24:33 [name] ([id number]) -------------------------------------------------------------------------------- Progress Note Details [name] Date of Service: 05/20/2016 3:45 PM Patient Name: N. Patient Account Number: [id number] Medical Record Treating RN: [name] [id number] Number: Other Clinician: 1981/07/26 (34 y.o. Treating Caitlain Tweed Date of Birth/Sex: Female) Physician/Extender: G Primary Care [name] Physician: Referring Physician: [name] in Treatment: 37 Subjective Chief Complaint Information obtained from Patient Patient in today for treatment of non-healing wound and HBO Treatment. she has just gotten out of hospital this week and is back to resume her hyperbaric oxygen therapy History of Present Illness (HPI) The following HPI elements were documented for the patient's wound: Location: dry gangrene both feet and heels Quality: Patient reports No Pain. Severity: Patient states wound are getting worse. Duration: Patient has had the wound for > 58months prior to seeking treatment at the wound center Context: The wound appeared gradually over time Modifying Factors: she has been in and out of hospital over the last 2 months Associated Signs and Symptoms: Patient reports having difficulty standing for long periods. Rebecah Matura is a 35 y.o. female who presents to our wound center, back in June 2016, referred by her PCP Dr. Zada Finders for nonhealing ulcers on the lateral aspect of the right heel. Of note she has a history of type 1 diabetes mellitus that has been uncontrolled. Past medical history significant for type 1 diabetes mellitus not controlled, ankylosing spondylitis, anorexia nervosa, irritable bowel syndrome, chronic kidney disease, chronic diarrhea. she then developed gangrene of  both feet due to severe peripheral vascular disease and also had gangrene of the tips of her fingers due to upper extremity vascular disease. She was being  worked up by vascular surgery at [facility] and at [facility] and has had several procedures done there. She started with hyperbaric oxygen therapy and had a total of 40 treatments the last one being on 06/20/2015. After the initial treatment of hyperbaric oxygen therapy she started having ear problems and had ultimately to use myringotomy tubes and this was done bilaterally. Since then her ears have been doing fine. In late September, she had seen vascular and hand surgeons. since then she's been in [place] at the rec center for surgery involving extensive vascular procedures for the upper extremities. She was then at [facility] with GI bleeds both upper and lower and has been in and out of hospital for that. She has recently been out of hospital for the last week. 09/09/2015 -- she was unable to get here in time to start her hyperbaric oxygen today and hence is only here for a wound care visit. 09/19/2015 -- she has been having vancomycin during her dialysis and continues to have vascular Lennartz, Witney N. ([id number]) appointments and the procedure is been set for early January. She has been unable to make it for her hemodialysis due to various medical symptoms. 09/30/2014 -- her vancomycin was stopped on 09/25/2015 and the mother has noticed the right foot has started draining for the last 3 days. Addendum: after examining the patient I was able to talk to her primary vascular surgeon Dr. Pernell Dupre at the [facility]. I told him about the necrotic area on the plantar aspect of right foot which is now wet gangrene and he agreed with me that he would admit her at [facility] under her care and synchronize further treatment. We have discussed her poor prognosis and he and I discussed the need for hospice care and for sitting down and talking to  the patient and her mother and giving them a proper detail of the prognosis. 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 She was worked up with MRIs, abdomen aortogram and placement of left-sided angioplasties were done. After a prolonged hospital she she was discharged home and was told to wear shrinker sock and stump protector and see her surgeon for further instructions regarding wound care and suture removal. Asked to take long-term doxycycline. 01/15/16; this is a patient I haven't seen before although she is been followed by Dr. Meyer Russel in this clinic today. She is a type I diabetic with severe PAD macrovascular disease. She has had a previous BKA. She has dry gangrene of the tips of her fingers which she showed me on the right to. She also has dry gangrene of the left first second and third toes and a portion of her proximal foot around these areas. She is followed by vascular surgery at [facility] and saw them recently they are not going to do surgery as of yet. She has a large black eschar over her heel which is beginning to separate in some areas. As I understand think she is paining these with Betadine. There is been some suggestion about retrying hyperbarics on her although she is still not able to commit to the frequency of treatment that would be necessary to  see improvement. She is also on Monday Wednesday Friday dialysis 01/21/16; the patient returns to see me today with regards to the left heel. Apparently she is not scheduled for any further attempts at revascularization of the left foot. She has  dry gangrene of the left medial foot, first and second toes and there is already some separation developing here. 01/28/16; the patient returns today for attention to the left foot specifically the left calcaneal ulcer. This is covered in a thick black eschar. I crosshatched this last week and we have been using Santyl. 02/05/16; I continue to work on the thick black eschar on the patient's left calcaneus. She is using Santyl that this side crosshatched this area and the eschar is beginning to loosen. 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 This is beginning to separate and liquefy as well especially between the first and second toes. In the time being her major complaint is fatigue at dialysis 02/26/16 I continue to work on the patient's left calcaneus thick adherent black eschar. I crosshatched this area and we have been using Santyl although it is very adherent area I remove some nonviable tissue today what I can see of this actually looks surprisingly good. On the same foot she has dry gangrene on the first and second toes and part of the forefoot underneath this. This is beginning to separate. 03/11/16; the patient comes in for her every 2 week appointment. I have been working on the black eschar on her heel. The patient apparently again has to come off dialysis early today after 2 hours due to severe complaints of nausea. She really does not look well. 04/02/2016 -- the patient has not been seen here for about 3 weeks now and has a new issue with the stump of the right amputation site and also her right posterior thigh. They have only noticed this for the last couple of days. 04/28/16; I had received a call from the patient's surgeon at [facility]. She had a left transmetatarsal amputation Coppens, Merit N. ([id number]) and Integra applied to the left heel. Both of  these areas appear to be doing well. There are dressing these with [facility] and they will return to their surgeon on Thursday. She has a new injury on the popliteal fossa on the right which I think was trauma from her stump. 05/20/16; the patient is following with her surgeon at [facility]. She's had a left transmetatarsal debridement of the left heel she had Integra place and apparently is using some consternation of Epson salts soaks, Betadine and Aquacel Ag. The left leg is wrapped I did not look at this today. She has the wound in her right popliteal fossa which is apparently a pressure area possibly related to sitting on a toilet for 2-3 hours multiple times a day. 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 We have been using Santyl to the area on the right popliteal fossa. She has a new wound on her right gluteal area but the patient would not let me look at that today Objective Constitutional Patient is hypotensive. The patient is just out of dialysis. Looks fatigued. Pulse regular and within target range for patient.Marland Kitchen Respirations regular, non-labored and within target range.. Temperature is normal and within the target range for the patient.. Vitals Time Taken: 4:16 PM,  Height: 68 in, Weight: 96 lbs, BMI: 14.6, Pulse: 78 bpm, Respiratory Rate: 16 breaths/min, Blood Pressure: 82/54 mmHg. General Notes: Wound exam; the patient has a quarter-sized area in the right popliteal fossa superior aspect. This as rolled edges and that adherent slough surface. I did not attempt to debridement this today. It will definitely need to continue Santyl and may need mechanical debridement in the future. She would not let me look at the right gluteal area. I did not remove the dressings on her left foot Integumentary (Hair, Skin) Wound #16 status is Open. Original cause of wound was Pressure Injury. The wound is located on  the Right,Posterior Amputation Site - Below Knee. The wound measures 2.8cm length x 2.8cm width x 0.1cm depth; 6.158cm^2 area and 0.616cm^3 volume. There is no tunneling or undermining noted. There is a large amount of serosanguineous drainage noted. The wound margin is distinct with the outline attached to the wound base. There is small (1-33%) red, pink granulation within the wound bed. There is a large (67- 100%) amount of necrotic tissue within the wound bed including Eschar and Adherent Slough. The periwound skin appearance exhibited: Localized Edema, Erythema. The surrounding wound skin color is noted with erythema which is circumferential. Periwound temperature was noted as No Abnormality. The periwound has tenderness on palpation. 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 Other: - as patient is able EULA, JASTER ([id number]) Continue with santyl to the right poplitieal wound with foam cover I did not review the left transmet amputatio siite or the right heal although I suspect both will eventually come back to [place] She wound not let me see the right buttock f/u 2 wweks o o Electronic Signature(s) Signed: 05/20/2016 5:34:52 PM By: Baltazar Najjar MD Entered By: Baltazar Najjar on 05/20/2016 17:33:32 [name] ([id number]) -------------------------------------------------------------------------------- SuperBill Details [name] Date of Service: 05/20/2016 Patient Name: N. Patient Account Number: [id number] Medical Record Treating RN: [name] [id number] Number: Other Clinician: [birth year] (34 y.o. 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

## 2016-06-03 ENCOUNTER — Encounter: Payer: Medicare Other | Attending: Internal Medicine | Admitting: Internal Medicine

## 2016-06-03 DIAGNOSIS — T87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, right lower extremity: Secondary | ICD-10-CM | POA: Diagnosis not present

## 2016-06-03 DIAGNOSIS — I70261 Atherosclerosis of native arteries of extremities with gangrene, right leg: Secondary | ICD-10-CM | POA: Diagnosis not present

## 2016-06-03 DIAGNOSIS — S71101A Unspecified open wound, right thigh, initial encounter: Secondary | ICD-10-CM | POA: Diagnosis not present

## 2016-06-03 DIAGNOSIS — X58XXXA Exposure to other specified factors, initial encounter: Secondary | ICD-10-CM | POA: Insufficient documentation

## 2016-06-03 DIAGNOSIS — N186 End stage renal disease: Secondary | ICD-10-CM | POA: Diagnosis not present

## 2016-06-03 DIAGNOSIS — E10621 Type 1 diabetes mellitus with foot ulcer: Secondary | ICD-10-CM | POA: Insufficient documentation

## 2016-06-03 DIAGNOSIS — E43 Unspecified severe protein-calorie malnutrition: Secondary | ICD-10-CM | POA: Diagnosis not present

## 2016-06-03 DIAGNOSIS — E1052 Type 1 diabetes mellitus with diabetic peripheral angiopathy with gangrene: Secondary | ICD-10-CM | POA: Insufficient documentation

## 2016-06-03 DIAGNOSIS — E1022 Type 1 diabetes mellitus with diabetic chronic kidney disease: Secondary | ICD-10-CM | POA: Insufficient documentation

## 2016-06-03 DIAGNOSIS — I70245 Atherosclerosis of native arteries of left leg with ulceration of other part of foot: Secondary | ICD-10-CM | POA: Insufficient documentation

## 2016-06-03 DIAGNOSIS — Z992 Dependence on renal dialysis: Secondary | ICD-10-CM | POA: Insufficient documentation

## 2016-06-03 DIAGNOSIS — L89622 Pressure ulcer of left heel, stage 2: Secondary | ICD-10-CM | POA: Diagnosis not present

## 2016-06-03 DIAGNOSIS — Z89511 Acquired absence of right leg below knee: Secondary | ICD-10-CM | POA: Insufficient documentation

## 2016-06-04 NOTE — Progress Notes (Signed)
DACIA, CAPERS ([id number]) Visit Report for 06/03/2016 Arrival Information Details SRUTI, AYLLON Date of Service: 06/03/2016 3:45 PM Patient Name: N. Patient Account Number: [id number] Medical Record Treating RN: Huel Coventry [id number] Number: Other Clinician: 1980/10/17 (34 y.o. Treating [name], [name] Date of Birth/Sex: Female) Physician/Extender: G Primary Care [name] Physician: Referring Physician: [name] in Treatment: 45 Visit Information History Since Last Visit Added or deleted any medications: No Patient Arrived: Wheel Chair Any new allergies or adverse No reactions: Arrival Time: 16:25 Had a fall or experienced change in No Accompanied By: mom activities of daily living that may Transfer Assistance: Manual affect Patient Identification Verified: Yes risk of falls: Secondary Verification Process Yes Signs or symptoms of abuse/neglect No Completed: since last visito Patient Requires Transmission-Based No Hospitalized since last visit: No Precautions: Pain Present Now: Unable to Patient Has Alerts: Yes Respond Electronic Signature(s) Signed: 06/03/2016 5:55:03 PM By: Elliot Gurney, RN, [name], Kim RN, [name] Entered By: Elliot Gurney, RN, [name], Kim on 06/03/2016 16:40:22 [name] ([id number]) -------------------------------------------------------------------------------- Clinic Level of Care Assessment Details [name] Date of Service: 06/03/2016 3:45 PM Patient Name: N. Patient Account Number: [id number] Medical Record Treating RN: Huel Coventry [id number] Number: Other Clinician: [birth year] (34 y.o. Treating [name], [name] Date of Birth/Sex: Female) Physician/Extender: G Primary Care [name] Physician: Referring Physician: [name] in Treatment: 39 Clinic Level of Care Assessment Items TOOL 4 Quantity Score []  - Use when only an EandM is performed on FOLLOW-UP visit 0 ASSESSMENTS - Nursing Assessment /  Reassessment X - Reassessment of Co-morbidities (includes updates in patient status) 1 10 X - Reassessment of Adherence to Treatment Plan 1 5 ASSESSMENTS - Wound and Skin Assessment / Reassessment []  - Simple Wound Assessment / Reassessment - one wound 0 X - Complex Wound Assessment / Reassessment - multiple wounds 2 5 []  - Dermatologic / Skin Assessment (not related to wound area) 0 ASSESSMENTS - Focused Assessment []  - Circumferential Edema Measurements - multi extremities 0 []  - Nutritional Assessment / Counseling / Intervention 0 []  - Lower Extremity Assessment (monofilament, tuning fork, pulses) 0 []  - Peripheral Arterial Disease Assessment (using hand held doppler) 0 ASSESSMENTS - Ostomy and/or Continence Assessment and Care []  - Incontinence Assessment and Management 0 []  - Ostomy Care Assessment and Management (repouching, etc.) 0 PROCESS - Coordination of Care []  - Simple Patient / Family Education for ongoing care 0 X - Complex (extensive) Patient / Family Education for ongoing care 1 20 X - Staff obtains Consents, Records, Test Results / Process Orders 1 10 Farooqui, Teyona N. ( ) []  - Staff telephones HHA, Nursing Homes / Clarify orders / etc 0 []  - Routine Transfer to another Facility (non-emergent condition) 0 []  - Routine Hospital Admission (non-emergent condition) 0 []  - New Admissions / / Ordering NPWT, Apligraf, etc. 0 []  - Emergency Hospital Admission (emergent condition) 0 X - Simple Discharge Coordination 1 10 []  - Complex (extensive) Discharge Coordination 0 PROCESS - Special Needs []  - Pediatric / Minor Patient Management 0 []  - Isolation Patient Management 0 []  - Hearing / Language / Visual special needs 0 []  - Assessment of Community assistance (transportation, D/C planning, etc.) 0 []  - Additional assistance / Altered mentation 0 []  - Support Surface(s) Assessment (bed, cushion, seat, etc.) 0 INTERVENTIONS - Wound  Cleansing / Measurement []  - Simple Wound Cleansing - one wound 0 X - Complex Wound Cleansing - multiple wounds 2 5 X - Wound Imaging (photographs - any number of wounds)  1 5 []  - Wound Tracing (instead of photographs) 0 []  - Simple Wound Measurement - one wound 0 X - Complex Wound Measurement - multiple wounds 2 5 INTERVENTIONS - Wound Dressings []  - Small Wound Dressing one or multiple wounds 0 X - Medium Wound Dressing one or multiple wounds 2 15 []  - Large Wound Dressing one or multiple wounds 0 []  - Application of Medications - topical 0 Baucum, Alicha N. ( ) []  - Application of Medications - injection 0 INTERVENTIONS - Miscellaneous []  - External ear exam 0 []  - Specimen Collection (cultures, biopsies, blood, body fluids, etc.) 0 []  - Specimen(s) / Culture(s) sent or taken to Lab for analysis 0 []  - Patient Transfer (multiple staff / Lift / Similar devices) 0 []  - Simple Staple / Suture removal (25 or less) 0 []  - Complex Staple / Suture removal (26 or more) 0 []  - Hypo / Hyperglycemic Management (close monitor of Blood Glucose) 0 []  - Ankle / Brachial Index (ABI) - do not check if billed separately 0 X - Vital Signs 1 5 Has the patient been seen at the hospital within the last three years: Yes Total Score: 125 Level Of Care: New/Established - Level 4 Electronic Signature(s) Signed: 06/03/2016 5:55:03 PM By: , RN, [name], Kim RN, [name] Entered By: , RN, [name], Kim on 06/03/2016 17:48:29 ( ) -------------------------------------------------------------------------------- Encounter Discharge Information Details Date of Service: 06/03/2016 3:45 PM Patient Name: N. Patient Account Number: Medical Record Treating RN: [name] Sites, RN, [name],  Number: Other Clinician: 06-Feb-1981 (34 y.o. Treating [name], [name] Date of Birth/Sex: Female) Physician/Extender: G Primary Care Physician: Referring Physician: 08/03/2016 in Treatment: 61 Encounter Discharge Information Items Discharge Pain Level: 0 Discharge Condition: Stable Ambulatory Status: Wheelchair Discharge Destination: Home Transportation: Private Auto Accompanied By: mom Schedule Follow-up Appointment: Yes Medication Reconciliation completed Yes and provided to Patient/Care Lujean Ebright: Provided on Clinical Summary of Care: 06/03/2016 Form Type Recipient Paper Patient HB Electronic Signature(s) Signed: 06/03/2016 5:55:03 PM By: [name] Eves RN, [name], Kim RN, [name] Previous Signature: 06/03/2016 5:01:36 PM Version By: [name] Entered By: 08/03/2016 RN, [name], Kim on 06/03/2016 17:49:24 Clover Mealy ([place] Sink) -------------------------------------------------------------------------------- Lower Extremity Assessment Details [id number] Date of Service: 06/03/2016 3:45 PM Patient Name: N. Patient Account Number: [name] Medical Record Treating RN: [name] 24 Number: Other Clinician: August 02, 1981 (34 y.o. Treating [name], [name] Date of Birth/Sex: Female) Physician/Extender: G Primary Care 08/03/2016 Physician: Referring Physician: Elliot Gurney in Treatment: 22 Notes NOt assessed per patient request. Patient is going to foot surgeon tomorrow and does not want her foot looked at today. Electronic Signature(s) Signed: 06/03/2016 5:55:03 PM By: Elliot Gurney, RN, [name], Kim RN, [name] Entered By: 08/03/2016, RN, [name], Kim on 06/03/2016 16:40:33 [id number] ([name]) -------------------------------------------------------------------------------- Multi Wound Chart Details 08/03/2016 Date of Service: 06/03/2016 3:45 PM Patient Name: N. Patient Account Number: Huel Coventry Medical Record Treating RN: [id number] 09/09/1981 Number: Other Clinician: 1981/08/01 (34 y.o. 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00% N/A Classification: Category/Stage III Category/Stage II N/A HBO Classification: Grade 1 N/A N/A Symmonds, Nolah N. ([id number]) Exudate Amount: Large Large N/A Exudate Type: Serosanguineous Serous N/A Exudate Color: red, brown amber N/A Wound Margin: Distinct, outline attached Flat and Intact N/A Granulation Amount: Small (1-33%) Medium (34-66%) N/A Granulation Quality: Red, Pink N/A N/A Necrotic Amount: Large (67-100%) Medium (34-66%) N/A Necrotic Tissue: Eschar, Adherent Slough Adherent Slough N/A Exposed Structures: Fat: Yes Fascia: No N/A Fat: No Tendon: No Muscle: No Joint: No Bone: No Limited to Skin Breakdown Epithelialization: None Small (1-33%) N/A Periwound Skin Texture: Edema: Yes Edema: No N/A Excoriation: No Induration: No Callus: No Crepitus: No Fluctuance: No Friable: No Rash: No Scarring: No Periwound Skin Moist: Yes Moist: Yes N/A Moisture: Maceration: No Dry/Scaly: No Periwound Skin Color: Erythema:  Yes Erythema: Yes N/A Atrophie Blanche: No Cyanosis: No Ecchymosis: No Hemosiderin Staining: No Mottled: No Pallor: No Rubor: No Erythema Location: Circumferential Circumferential N/A Erythema Change: No Change N/A N/A Temperature: No Abnormality N/A N/A Tenderness on Yes No N/A Palpation: Wound Preparation: Ulcer Cleansing: Ulcer Cleansing: N/A Rinsed/Irrigated with Rinsed/Irrigated with Saline Saline Topical Anesthetic Topical Anesthetic Applied: Other: lidocaine Applied: Other: lidocaine 4% 4% DISA, RIEDLINGER ([id number]) Treatment Notes Electronic Signature(s) Signed: 06/03/2016 5:55:03 PM By: Elliot Gurney, RN, [name], Kim RN, [name] Entered By: Elliot Gurney, RN, [name], Kim on 06/03/2016 16:50:58 [name] ([id number]) -------------------------------------------------------------------------------- Multi-Disciplinary Care Plan Details [name] Date of Service: 06/03/2016 3:45 PM Patient Name: N. Patient Account Number: [id number] Medical Record Treating RN: Huel Coventry [id number] Number: Other Clinician: 22-Jan-1981 (34 y.o. Treating [name], [name] Date of Birth/Sex: Female) Physician/Extender: G Primary Care [name] Physician: Referring Physician: [name] in Treatment: 18 Active Inactive HBO Nursing Diagnoses: Anxiety related to feelings of confinement associated with the hyperbaric oxygen chamber Anxiety related to knowledge deficit of hyperbaric oxygen therapy and treatment procedures Discomfort related to temperature and humidity changes inside hyperbaric chamber Potential for barotraumas to ears, sinuses, teeth, and lungs or cerebral gas embolism related to changes in atmospheric pressure inside hyperbaric oxygen chamber Potential for oxygen toxicity seizures related to delivery of 100% oxygen at an increased atmospheric pressure Potential for pulmonary oxygen toxicity related to delivery of 100% oxygen at an increased  atmospheric pressure Goals: Barotrauma will be prevented during HBO2 Date Initiated: 08/30/2015 Goal Status: Active Patient and/or family will be able to state/discuss factors appropriate to the management of their disease process during treatment Date Initiated: 08/30/2015 Goal Status: Active Patient will tolerate the hyperbaric oxygen therapy treatment Date Initiated: 08/30/2015 Goal Status: Active Patient will tolerate the internal climate of the chamber Date Initiated: 08/30/2015 Goal Status: Active Patient/caregiver will verbalize understanding of HBO goals, rationale, procedures and potential hazards Date Initiated: 08/30/2015 Goal Status: Active Signs and symptoms of pulmonary oxygen toxicity will be recognized and promptly addressed Date Initiated: 08/30/2015 ZANI, KYLLONEN ([id number]) Goal Status: Active Signs and symptoms of seizure will be recognized and promptly addressed ; seizing patients will suffer no harm Date Initiated: 08/30/2015 Goal Status: Active Interventions: Administer a five (5) minute air break for patient if signs and symptoms of seizure appear and  notify the hyperbaric physician Administer a ten (10) minute air break for patient if signs and symptoms of seizure appear and notify the hyperbaric physician Administer decongestants, per physician orders, prior to HBO2 Administer the correct therapeutic gas delivery based on the patients needs and limitations, per physician order Assess and provide for patientos comfort related to the hyperbaric environment and equalization of middle ear Assess for signs and symptoms related to adverse events, including but not limited to confinement anxiety, pneumothorax, oxygen toxicity and baurotrauma Assess patient for any history of confinement anxiety Assess patient's knowledge and expectations regarding hyperbaric medicine and provide education related to the hyperbaric environment, goals of treatment and  prevention of adverse events Implement protocols to decrease risk of pneumothorax in high risk patients Notes: Abuse / Safety / Falls / Self Care Management Nursing Diagnoses: Potential for falls Goals: Patient will remain injury free Date Initiated: 09/19/2015 Goal Status: Active Patient/caregiver will verbalize/demonstrate measures taken to prevent injury and/or falls Date Initiated: 09/19/2015 Goal Status: Active Interventions: Assess fall risk on admission and as needed Assess impairment of mobility on admission and as needed per policy Notes: Orientation to the Wound Care Program Nursing Diagnoses: YOLAND, SCHERR ([id number]) Knowledge deficit related to the wound healing center program Goals: Patient/caregiver will verbalize understanding of the Wound Healing Center Program Date Initiated: 08/30/2015 Goal Status: Active Interventions: Provide education on orientation to the wound center Notes: Pressure Nursing Diagnoses: Knowledge deficit related to causes and risk factors for pressure ulcer development Knowledge deficit related to management of pressures ulcers Potential for impaired tissue integrity related to pressure, friction, moisture, and shear Goals: Patient will remain free from development of additional pressure ulcers Date Initiated: 08/30/2015 Goal Status: Active Patient will remain free of pressure ulcers Date Initiated: 08/30/2015 Goal Status: Active Patient/caregiver will verbalize risk factors for pressure ulcer development Date Initiated: 08/30/2015 Goal Status: Active Patient/caregiver will verbalize understanding of pressure ulcer management Date Initiated: 08/30/2015 Goal Status: Active Interventions: Assess: immobility, friction, shearing, incontinence upon admission and as needed Assess offloading mechanisms upon admission and as needed Assess potential for pressure ulcer upon admission and as needed Provide education on pressure  ulcers Treatment Activities: Patient referred for home evaluation of offloading devices/mattresses : 01/15/2016 Patient referred for pressure reduction/relief devices : 01/15/2016 Pressure reduction/relief device ordered : 01/15/2016 Notes: LUVINA, POIRIER ([id number]) Wound/Skin Impairment Nursing Diagnoses: Impaired tissue integrity Knowledge deficit related to ulceration/compromised skin integrity Goals: Patient will have a decrease in wound volume by X% from date: (specify in notes) Date Initiated: 08/30/2015 Goal Status: Active Patient/caregiver will verbalize understanding of skin care regimen Date Initiated: 08/30/2015 Goal Status: Active Ulcer/skin breakdown will have a volume reduction of 30% by week 4 Date Initiated: 08/30/2015 Goal Status: Active Ulcer/skin breakdown will have a volume reduction of 50% by week 8 Date Initiated: 08/30/2015 Goal Status: Active Ulcer/skin breakdown will have a volume reduction of 80% by week 12 Date Initiated: 08/30/2015 Goal Status: Active Ulcer/skin breakdown will heal within 14 weeks Date Initiated: 08/30/2015 Goal Status: Active Interventions: Assess patient/caregiver ability to obtain necessary supplies Assess patient/caregiver ability to perform ulcer/skin care regimen upon admission and as needed Assess ulceration(s) every visit Provide education on ulcer and skin care Notes: Electronic Signature(s) Signed: 06/03/2016 5:55:03 PM By: Elliot Gurney, RN, [name], Kim RN, [name] Entered By: Elliot Gurney, RN, [name], Kim on 06/03/2016 16:50:38 [name] ([id number]) -------------------------------------------------------------------------------- Pain Assessment Details [name] Date of Service: 06/03/2016 3:45 PM Patient Name: N. Patient Account Number: [id number] Medical Record Treating  RN: Huel CoventryWoody, Kim [id number] Number: Other Clinician: 04/13/1981 (34 y.o. Treating [name], [name] Date of Birth/Sex: Female) Physician/Extender:  G Primary Care Rolin Barrylmedo, Mario Physician: Referring Physician: [name] Partridgelmedo, Mario Weeks in Treatment: 39 Active Problems Location of Pain Severity and Description of Pain Patient Has Paino No Site Locations With Dressing Change: No Pain Management and Medication Current Pain Management: Electronic Signature(s) Signed: 06/03/2016 5:55:03 PM By: Elliot GurneyWoody, RN, [name], Kim RN, [name] Entered By: Elliot GurneyWoody, RN, [name], Kim on 06/03/2016 16:40:27 [name] EvesBLACKBURN, Zonya N. ([id number]) -------------------------------------------------------------------------------- Patient/Caregiver Education Details Shanon PayorBLACKBURN, Abri Date of Service: 06/03/2016 3:45 PM Patient Name: N. Patient Account Number: [id number] Medical Record Treating RN: Huel CoventryWoody, Kim [id number] Number: Other Clinician: 04/13/1981 (34 y.o. Treating [name], [name] Date of Birth/Gender: Female) Physician/Extender: G Primary Care Weeks in Treatment: 5239 Rolin Barrylmedo, Mario Physician: Referring Physician: Rolin Barrylmedo, Mario Education Assessment Education Provided To: Patient Education Topics Provided Wound/Skin Impairment: Handouts: Caring for Your Ulcer, Other: continue wound care as prescribed Methods: Demonstration, Explain/Verbal Responses: State content correctly Electronic Signature(s) Signed: 06/03/2016 5:55:03 PM By: Elliot GurneyWoody, RN, [name], Kim RN, [name] Entered By: Elliot GurneyWoody, RN, [name], Kim on 06/03/2016 17:49:51 [name] EvesBLACKBURN, Jacquie N. ([id number]) -------------------------------------------------------------------------------- Wound Assessment Details Shanon PayorBLACKBURN, Hang Date of Service: 06/03/2016 3:45 PM Patient Name: N. Patient Account Number: [id number] Medical Record Treating RN: Huel CoventryWoody, Kim [id number] Number: Other Clinician: 04/13/1981 (34 y.o. 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 Wound Open Status: Wounding Event: Pressure Injury Comorbid Anemia, Type I Diabetes, End Stage Date Acquired: 04/02/2016 History: Renal Disease, Rheumatoid Arthritis, Weeks Of Treatment: 8 Neuropathy Clustered Wound: No Photos Wound Measurements Length: (cm) 2.5 Width: (cm) 2.8 Depth: (cm) 0.2 Area: (cm) 5.498 Volume: (cm) 1.1 % Reduction in Area: 7.4% % Reduction in Volume: -85.2% Epithelialization: None Wound Description Classification: Category/Stage III Foul Diabetic Severity (Wagner): Grade 1 Wound Margin: Distinct, outline attached Exudate Amount: Large Exudate Type: Serosanguineous Exudate Color: red, brown Odor After Cleansing: No Wound Bed Medici, Irvin N. ([id number]) Granulation Amount: Small (1-33%) Exposed Structure Granulation Quality: Red, Pink Fat Layer Exposed: Yes Necrotic Amount: Large (67-100%) Necrotic Quality: Eschar, Adherent Slough Periwound Skin Texture Texture Color No Abnormalities Noted: No No Abnormalities Noted: No Localized Edema: Yes Erythema: Yes Erythema Location: Circumferential Moisture Erythema Change: No Change No Abnormalities Noted: No Moist: Yes Temperature / Pain Temperature: No Abnormality Tenderness on Palpation: Yes Wound Preparation Ulcer Cleansing: Rinsed/Irrigated with Saline Topical Anesthetic Applied: Other: lidocaine 4%, Treatment Notes 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 Cleansed with: Clean wound with Normal Saline 2. Anesthetic Topical Lidocaine 4% cream to wound bed prior to debridement 4. Dressing Applied: Santyl Ointment 5. Secondary Dressing Applied Bordered Foam Dressing Electronic Signature(s) Signed: 06/03/2016 5:55:03 PM By: Elliot GurneyWoody, RN, [name], Kim RN, [name] Entered By: Elliot GurneyWoody, RN, [name], Kim on 06/03/2016 16:35:42 [name] EvesBLACKBURN, Samyia N.  ([id number]) -------------------------------------------------------------------------------- Wound Assessment Details Shanon PayorBLACKBURN, Dijon Date of Service: 06/03/2016 3:45 PM Patient Name: N. Patient Account Number: [id number] Medical Record Treating RN: Huel CoventryWoody, Kim [id number] Number: Other Clinician: 04/13/1981 (34 y.o. Treating [name], [name] Date of Birth/Sex: Female) Physician/Extender: G Primary Care Rolin Barrylmedo, Mario Physician: Referring Physician: [name] Partridgelmedo, Mario Weeks in Treatment: 39 Wound Status Wound Number: 18 Primary Pressure Ulcer Etiology: Wound Location: Left Ischial Tuberosity Wound Open Wounding Event: Pressure Injury Status: Date Acquired: 05/12/2016 Comorbid Anemia, Type I Diabetes, End Stage Weeks Of  Treatment: 0 History: Renal Disease, Rheumatoid Arthritis, Clustered Wound: No Neuropathy Photos Wound Measurements Length: (cm) 4.5 Width: (cm) 3.8 Depth: (cm) 0.1 Area: (cm) 13.43 Volume: (cm) 1.343 % Reduction in Area: 0% % Reduction in Volume: 0% Epithelialization: Small (1-33%) Wound Description Classification: Category/Stage II Wound Margin: Flat and Intact Exudate Amount: Large Exudate Type: Serous Exudate Color: amber Wound Bed Granulation Amount: Medium (34-66%) Exposed Structure Bosak, Tamy N. ([id number]) Necrotic Amount: Medium (34-66%) Fascia Exposed: No Necrotic Quality: Adherent Slough Fat Layer Exposed: No Tendon Exposed: No Muscle Exposed: No Joint Exposed: No Bone Exposed: No Limited to Skin Breakdown Periwound Skin Texture Texture Color No Abnormalities Noted: No No Abnormalities Noted: No Callus: No Atrophie Blanche: No Crepitus: No Cyanosis: No Excoriation: No Ecchymosis: No Fluctuance: No Erythema: Yes Friable: No Erythema Location: Circumferential Induration: No Hemosiderin Staining: No Localized Edema: No Mottled: No Rash: No Pallor: No Scarring: No Rubor: No Moisture No Abnormalities Noted:  No Dry / Scaly: No Maceration: No Moist: Yes Wound Preparation Ulcer Cleansing: Rinsed/Irrigated with Saline Topical Anesthetic Applied: Other: lidocaine 4%, Treatment Notes Wound #18 (Left Ischial Tuberosity) 1. Cleansed with: Clean wound with Normal Saline 2. Anesthetic Topical Lidocaine 4% cream to wound bed prior to debridement 4. Dressing Applied: Santyl Ointment 5. Secondary Dressing Applied Bordered Foam Dressing Electronic Signature(s) Signed: 06/03/2016 5:55:03 PM By: Elliot Gurney, RN, [name], Kim RN, [name] Entered By: Elliot Gurney, RN, [name], Kim on 06/03/2016 16:42:44 [name] ([id number]) -------------------------------------------------------------------------------- Vitals Details [name] Date of Service: 06/03/2016 3:45 PM Patient Name: N. Patient Account Number: [id number] Medical Record Treating RN: Huel Coventry [id number] Number: Other Clinician: 09/13/1981 (34 y.o. Treating [name], [name] Date of Birth/Sex: Female) Physician/Extender: G Primary Care [name] Physician: Referring Physician: [name] in Treatment: 39 Vital Signs Time Taken: 16:25 Temperature (F): 97.9 Height (in): 68 Pulse (bpm): 78 Weight (lbs): 96 Respiratory Rate (breaths/min): 16 Body Mass Index (BMI): 14.6 Blood Pressure (mmHg): 141/108 Reference Range: 80 - 120 mg / dl Electronic Signature(s) Signed: 06/03/2016 5:55:03 PM By: Elliot Gurney, RN, [name], Kim RN, [name] Entered By: Elliot Gurney, RN, [name], Kim on 06/03/2016 16:40:29

## 2016-06-04 NOTE — Progress Notes (Signed)
JONEISHA, DEMANN ([id number]) Visit Report for 06/03/2016 Chief Complaint Document Details MILES, GADE Date of Service: 06/03/2016 3:45 PM Patient Name: N. Patient Account Number: [id number] Medical Record Treating RN: Clover Mealy RN, [name], Herminie Sink [id number] Number: Other Clinician: 12/26/1980 (34 y.o. Treating [name], [name] Date of Birth/Sex: Female) Physician/Extender: G Primary Care [name] Physician: Referring Physician: [name] in Treatment: 24 Information Obtained from: Patient Chief Complaint Patient in today for treatment of non-healing wound and HBO Treatment. she has just gotten out of hospital this week and is back to resume her hyperbaric oxygen therapy Electronic Signature(s) Signed: 06/04/2016 12:55:33 PM By: Baltazar Najjar MD Entered By: Baltazar Najjar on 06/03/2016 16:58:39 [name] ([id number]) -------------------------------------------------------------------------------- HPI Details [name] Date of Service: 06/03/2016 3:45 PM Patient Name: N. Patient Account Number: [id number] Medical Record Treating RN: Clover Mealy RN, [name], Lyndon Sink [id number] Number: Other Clinician: 1980-10-15 (34 y.o. Treating [name], [name] Date of Birth/Sex: Female) Physician/Extender: G Primary Care [name] Physician: Referring Physician: [name] in Treatment: 4 History of Present Illness Location: dry gangrene both feet and heels Quality: Patient reports No Pain. Severity: Patient states wound are getting worse. Duration: Patient has had the wound for > 52months prior to seeking treatment at the wound center Context: The wound appeared gradually over time Modifying Factors: she has been in and out of hospital over the last 2 months Associated Signs and Symptoms: Patient reports having difficulty standing for long periods. HPI Description: Demple Cisse is a 35 y.o. female who presents to our wound center, back in  June 2016, referred by her PCP Dr. Zada Finders for nonhealing ulcers on the lateral aspect of the right heel. Of note she has a history of type 1 diabetes mellitus that has been uncontrolled. Past medical history significant for type 1 diabetes mellitus not controlled, ankylosing spondylitis, anorexia nervosa, irritable bowel syndrome, chronic kidney disease, chronic diarrhea. she then developed gangrene of both feet due to severe peripheral vascular disease and also had gangrene of the tips of her fingers due to upper extremity vascular disease. She was being worked up by vascular surgery at [facility] and at [facility] and has had several procedures done there. She started with hyperbaric oxygen therapy and had a total of 40 treatments the last one being on 06/20/2015. After the initial treatment of hyperbaric oxygen therapy she started having ear problems and had ultimately to use myringotomy tubes and this was done bilaterally. Since then her ears have been doing fine. In late September, she had seen vascular and hand surgeons. since then she's been in [place] at the rec center for surgery involving extensive vascular procedures for the upper extremities. She was then at [facility] with GI bleeds both upper and lower and has been in and out of hospital for that. She has recently been out of hospital for the last week. 09/09/2015 -- she was unable to get here in time to start her hyperbaric oxygen today and hence is only here for a wound care visit. 09/19/2015 -- she has been having vancomycin during her dialysis and continues to have vascular appointments and the procedure is been set for early January. She has been unable to make it for her hemodialysis due to various medical symptoms. 09/30/2014 -- her vancomycin was stopped on 09/25/2015 and the mother has noticed the right foot has started draining for the last 3 days. Addendum: after examining the patient I was able to talk to her primary vascular  surgeon Dr. Pernell Dupre at the [facility]. I told him about the necrotic area on the plantar aspect of right foot which is now wet gangrene and he agreed with me that he would admit her at [facility] under her care and synchronize further treatment. We have discussed her poor prognosis and he and I discussed the need for hospice care [facility], SACHI BOULAY. ([id number]) and for sitting down and talking to the patient and her mother and giving them a proper detail of the prognosis. 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 She was worked up with MRIs, abdomen aortogram and placement of left-sided angioplasties were done. After a prolonged hospital she she was discharged home and was told to wear shrinker sock and stump protector and see her surgeon for further instructions regarding wound care and suture removal. Asked to take long-term doxycycline. 01/15/16; this is a patient I haven't seen before although she is been followed by Dr. Meyer Russel in this clinic today. She is a type I diabetic with severe PAD macrovascular disease. She has had a previous BKA. She has dry gangrene of the tips of her fingers which she showed me on the right to. She also has dry gangrene of the left first second and third toes and a portion of her proximal foot around these areas. She is followed by vascular surgery at [facility] and saw them recently they are not going to do surgery as of yet. She has a large black eschar over her heel which is beginning to separate in some areas. As I understand think she is paining these with Betadine.  There is been some suggestion about retrying hyperbarics on her although she is still not able to commit to the frequency of treatment that would be necessary to see improvement. She is also on Monday Wednesday Friday dialysis 01/21/16; the patient returns to see me today with regards to the left heel. Apparently she is not scheduled for any further attempts at revascularization of the left foot. 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 01/28/16; the patient returns today for attention to the left foot specifically the left calcaneal ulcer. This is covered in a thick black eschar. I crosshatched this last week and we have been using Santyl. 02/05/16; I continue to work on the thick black eschar on the patient's left calcaneus. She is using Santyl that this side crosshatched this area and the eschar is beginning to loosen. 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 This is beginning to separate and liquefy as well especially between the first and second toes. In the time being her major complaint is fatigue at dialysis 02/26/16 I continue to work on the patient's left calcaneus thick adherent black eschar. I crosshatched this area and we have been using Santyl although it is very adherent area I remove some nonviable tissue today what I can see of this actually looks surprisingly good. On the same foot she has dry gangrene on the first and  second toes and part of the forefoot underneath this. This is beginning to separate. 03/11/16; the patient comes in for her every 2 week appointment. I have been working on the black eschar on her heel. The patient apparently again has to come off dialysis early today after 2 hours due to severe complaints of nausea. She really does not look well. 04/02/2016  -- the patient has not been seen here for about 3 weeks now and has a new issue with the stump of the right amputation site and also her right posterior thigh. They have only noticed this for the last couple of days. 04/28/16; I had received a call from the patient's surgeon at [facility]. She had a left transmetatarsal amputation and Integra applied to the left heel. Both of these areas appear to be doing well. There are dressing these with [facility] and they will return to their surgeon on Thursday. She has a new injury on the popliteal fossa on the right which I think was trauma from her stump. 05/20/16; the patient is following with her surgeon at [facility]. She's had a left transmetatarsal debridement of the left heel she had Integra place and apparently is using some consternation of Epson salts soaks, Betadine and Aquacel Ag. The left leg is wrapped I did not look at this today. She has the wound in her right popliteal fossa which is apparently a pressure area possibly related to sitting on a toilet for 2-3 hours multiple times a day. 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 We have been using Santyl to the area on the right popliteal fossa. She has Reihl, Layia N. ([id number]) a new wound on her right gluteal area but the patient would not let me look at that today 06/03/16; patient is not doing particularly well. She now has a pressure area over her left visual tuberosity. This is of quite a size and covered with an adherent surface slough. She looks as though she has lost weight, I didn't want to go ahead and attempt to debridement this today. There is really no evidence of infection. The area behind the right popliteal fossa looks about the same as 2 weeks ago we have been using Santyl to this area. I did not look at her left foot which is wrapped been followed by podiatry at [facility] Electronic Signature(s) Signed:  06/04/2016 12:55:33 PM By: Baltazar Najjar MD Entered By: Baltazar Najjar on 06/03/2016 17:00:39 [name] ([id number]) -------------------------------------------------------------------------------- Physical Exam Details [name] Date of Service: 06/03/2016 3:45 PM Patient Name: N. Patient Account Number: [id number] Medical Record Treating RN: Clover Mealy RN, [name], [place] Sink [id number] Number: Other Clinician: [birth year] (34 y.o. Treating [name], [name] Date of Birth/Sex: Female) Physician/Extender: G Primary Care [name] Physician: Referring Physician: [name] in Treatment: 82 Constitutional Patient is hypertensive.. Pulse regular and within target range for patient.Marland Kitchen Respirations regular, non-labored and within target range.. Temperature is normal and within the target range for the patient.. Patient looks tired and frail. She also looks as though she's lost weight. Eyes Conjunctivae clear. No discharge.Marland Kitchen Respiratory Respiratory effort is easy and symmetric bilaterally. Rate is normal at rest and on room air.. Cardiovascular Left foot is wrapped. Gastrointestinal (GI) Abdomen is soft and non-distended without masses or tenderness. Bowel sounds active in all quadrants.. No liver or spleen enlargement or tenderness.Marland Kitchen Psychiatric No evidence of depression, anxiety, or agitation. Calm, cooperative, and communicative. Appropriate interactions and affect.. Notes Wound exam;  the patient's quarter-sized area in the right popliteal fossa still does not look particularly healthy. There is no evidence of infection covered in a surface slough. The area on her left visual tuberosity is a fairly substantial size covered in a surface slough. This is probably a stage II wound Electronic Signature(s) Signed: 06/04/2016 12:55:33 PM By: Baltazar Najjar MD Entered By: Baltazar Najjar on 06/03/2016 17:03:59 [name]  ([id number]) -------------------------------------------------------------------------------- Physician Orders Details [name] Date of Service: 06/03/2016 3:45 PM Patient Name: N. Patient Account Number: [id number] Medical Record Treating RN: Huel Coventry [id number] Number: Other Clinician: [birth year] (34 y.o. Treating [name], [name] Date of Birth/Sex: Female) Physician/Extender: G Primary Care [name] Physician: Referring Physician: [name] in Treatment: 42 Verbal / Phone Orders: Yes Clinician: Huel Coventry Read Back and Verified: Yes Diagnosis Coding Wound Cleansing 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,Posterior Amputation Site - Below Knee o Clean wound with Normal Saline. o Cleanse wound with mild soap and water Wound #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 o Clean wound with Normal Saline. o Cleanse wound with mild soap and water Wound #18 Left Ischial Tuberosity o Clean wound with Normal Saline. o Cleanse wound with mild soap and water Wound #9 Left Calcaneous o Clean wound with Normal Saline. o Cleanse wound with mild soap and water Primary Wound Dressing 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,Posterior Amputation Site - Below Knee o Santyl Ointment Wound #18 Left Ischial Tuberosity o Santyl Ointment Secondary Dressing 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,Posterior Amputation Site - Below Knee o Boardered Foam Dressing Wound #18 Left Ischial Tuberosity o Boardered Foam Dressing Dressing Change Frequency 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,Posterior Amputation Site - Below Knee Monette, Conchita N. ([id number]) o Change dressing every day. Wound #18 Left Ischial Tuberosity o Change dressing every day. Follow-up Appointments 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,Posterior Amputation Site - Below Knee o Return Appointment in 2 weeks. o Other: - as patient is able Wound #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 o Return Appointment in 2 weeks. o Other: - as patient is able Wound #18 Left Ischial  Tuberosity o Return Appointment in 2 weeks. o Other: - as patient is able Wound #9 Left Calcaneous o Return Appointment in 2 weeks. o Other: - as patient is able Medications-please add to medication list. 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,Posterior Amputation Site - Below Knee o Santyl Enzymatic Ointment Wound #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 o Santyl Enzymatic Ointment Wound #18 Left Ischial Tuberosity o Santyl Enzymatic Ointment Wound #9 Left Calcaneous o Santyl Enzymatic Ointment Electronic Signature(s) Signed: 06/03/2016 5:55:03 PM By: Elliot Gurney, RN, [name], Kim RN, [name] Signed: 06/04/2016 12:55:33 PM By: Baltazar Najjar MD Entered By: Elliot Gurney, RN, [name], Kim on 06/03/2016 16:52:42 [name] ([id number]) -------------------------------------------------------------------------------- Problem List Details [name] Date of Service: 06/03/2016 3:45 PM Patient Name: N. Patient Account Number: [id number] Medical Record Treating RN: Clover Mealy RN, [name], Harmon Sink [id number] Number: Other Clinician: 1981-01-06 (34 y.o. Treating [name], [name] Date of Birth/Sex: Female) Physician/Extender: G Primary Care [name] Physician: Referring Physician: [name] in Treatment: 36 Active Problems ICD-10 Encounter Code Description Active Date Diagnosis E10.621 Type 1 diabetes mellitus with foot ulcer 08/30/2015 Yes E10.52 Type 1 diabetes mellitus with diabetic peripheral 08/30/2015 Yes angiopathy with gangrene I70.245 Atherosclerosis of native arteries of left leg with ulceration 08/30/2015 Yes of other part of foot I70.261 Atherosclerosis of native arteries of extremities with 08/30/2015 Yes gangrene, right leg L89.622 Pressure ulcer of left heel, stage 2 08/30/2015 Yes Z89.511 Acquired absence of right leg below knee 11/29/2015 Yes T87.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, right  lower extremity 04/02/2016 Yes S71.101A Unspecified open wound, right thigh, initial encounter  04/02/2016 Yes Inactive Problems IMOGEN, MADDALENA ([id number]) Resolved Problems ICD-10 Code Description Active Date Resolved Date L02.611 Cutaneous abscess of right foot 10/01/2015 10/01/2015 Electronic Signature(s) Signed: 06/04/2016 12:55:33 PM By: Baltazar Najjar MD Entered By: Baltazar Najjar on 06/03/2016 16:58:18 [name] ([id number]) -------------------------------------------------------------------------------- Progress Note Details [name] Date of Service: 06/03/2016 3:45 PM Patient Name: N. Patient Account Number: [id number] Medical Record Treating RN: Clover Mealy RN, [name], [place] Sink [id number] Number: Other Clinician: 24-Oct-1980 (34 y.o. Treating [name], [name] Date of Birth/Sex: Female) Physician/Extender: G Primary Care [name] Physician: Referring Physician: [name] in Treatment: 29 Subjective Chief Complaint Information obtained from Patient Patient in today for treatment of non-healing wound and HBO Treatment. she has just gotten out of hospital this week and is back to resume her hyperbaric oxygen therapy History of Present Illness (HPI) The following HPI elements were documented for the patient's wound: Location: dry gangrene both feet and heels Quality: Patient reports No Pain. Severity: Patient states wound are getting worse. Duration: Patient has had the wound for > 4months prior to seeking treatment at the wound center Context: The wound appeared gradually over time Modifying Factors: she has been in and out of hospital over the last 2 months Associated Signs and Symptoms: Patient reports having difficulty standing for long periods. Jameah Rouser is a 35 y.o. female who presents to our wound center, back in June 2016, referred by her PCP Dr. Zada Finders for nonhealing ulcers on the lateral aspect of the right heel. Of note she has a history of type 1 diabetes mellitus that has been uncontrolled. Past medical history  significant for type 1 diabetes mellitus not controlled, ankylosing spondylitis, anorexia nervosa, irritable bowel syndrome, chronic kidney disease, chronic diarrhea. she then developed gangrene of both feet due to severe peripheral vascular disease and also had gangrene of the tips of her fingers due to upper extremity vascular disease. She was being worked up by vascular surgery at [facility] and at [facility] and has had several procedures done there. She started with hyperbaric oxygen therapy and had a total of 40 treatments the last one being on 06/20/2015. After the initial treatment of hyperbaric oxygen therapy she started having ear problems and had ultimately to use myringotomy tubes and this was done bilaterally. Since then her ears have been doing fine. In late September, she had seen vascular and hand surgeons. since then she's been in [place] at the rec center for surgery involving extensive vascular procedures for the upper extremities. She was then at [facility] with GI bleeds both upper and lower and has been in and out of hospital for that. She has recently been out of hospital for the last week. 09/09/2015 -- she was unable to get here in time to start her hyperbaric oxygen today and hence is only here for a wound care visit. 09/19/2015 -- she has been having vancomycin during her dialysis and continues to have vascular Florence, Marylou N. ([id number]) appointments and the procedure is been set for early January. She has been unable to make it for her hemodialysis due to various medical symptoms. 09/30/2014 -- her vancomycin was stopped on 09/25/2015 and the mother has noticed the right foot has started draining for the last 3 days. Addendum: after examining the patient I was able to talk to her primary vascular surgeon Dr. Pernell Dupre at the [facility]. I told him about the necrotic area on the plantar aspect  of right foot which is now wet gangrene and he agreed with me that he would  admit her at [facility] under her care and synchronize further treatment. We have discussed her poor prognosis and he and I discussed the need for hospice care and for sitting down and talking to the patient and her mother and giving them a proper detail of the prognosis. 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 She was worked up with MRIs, abdomen aortogram and placement of left-sided angioplasties were done. After a prolonged hospital she she was discharged home and was told to wear shrinker sock and stump protector and see her surgeon for further instructions regarding wound care and suture removal. Asked to take long-term doxycycline. 01/15/16; this is a patient I haven't seen before although she is been followed by Dr. Meyer Russel in this clinic today. She is a type I diabetic with severe PAD macrovascular disease. She has had a previous BKA. She has dry gangrene of the tips of her fingers which she showed me on the right to. She also has dry gangrene of the left first second and third toes and a portion of her proximal foot around these areas. She is followed by vascular surgery at [facility] and saw them recently they are not going to do surgery as of yet. She has a large black eschar over her heel which is beginning to separate in some areas. As I understand think she is paining these with Betadine. There is been some suggestion about retrying hyperbarics on her although she is still not able to commit to the frequency of treatment that would be necessary to see improvement. She is also on Monday  Wednesday Friday dialysis 01/21/16; the patient returns to see me today with regards to the left heel. Apparently she is not scheduled for any further attempts at revascularization of the left foot. 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 01/28/16; the patient returns today for attention to the left foot specifically the left calcaneal ulcer. This is covered in a thick black eschar. I crosshatched this last week and we have been using Santyl. 02/05/16; I continue to work on the thick black eschar on the patient's left calcaneus. She is using Santyl that this side crosshatched this area and the eschar is beginning to loosen. 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 This is beginning to separate and liquefy as well especially between the first and second toes. In the time being her major complaint is fatigue at dialysis 02/26/16 I continue to work on the patient's left calcaneus thick adherent black eschar. I crosshatched this area and we have been using Santyl although it is very adherent area I remove some nonviable tissue today what I can see of this actually looks surprisingly good. On the same foot she has dry gangrene on the first and second toes and part of the forefoot underneath this. This is beginning to separate. 03/11/16;  the patient comes in for her every 2 week appointment. I have been working on the black eschar on her heel. The patient apparently again has to come off dialysis early today after 2 hours due to severe complaints of nausea. She really does not look well. 04/02/2016 -- the patient has not been seen here for about 3 weeks now and has a new issue with the stump of the right amputation site and also her right posterior thigh. They have only noticed this for the  last couple of days. 04/28/16; I had received a call from the patient's surgeon at [facility]. She had a left transmetatarsal amputation Muratore, Addis N. ([id number]) and Integra applied to the left heel. Both of these areas appear to be doing well. There are dressing these with [facility] and they will return to their surgeon on Thursday. She has a new injury on the popliteal fossa on the right which I think was trauma from her stump. 05/20/16; the patient is following with her surgeon at [facility]. She's had a left transmetatarsal debridement of the left heel she had Integra place and apparently is using some consternation of Epson salts soaks, Betadine and Aquacel Ag. The left leg is wrapped I did not look at this today. She has the wound in her right popliteal fossa which is apparently a pressure area possibly related to sitting on a toilet for 2-3 hours multiple times a day. 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 We have been using Santyl to the area on the right popliteal fossa. She has a new wound on her right gluteal area but the patient would not let me look at that today 06/03/16; patient is not doing particularly well. She now has a pressure area over her left visual tuberosity. This is of quite a size and covered with an adherent surface slough. She looks as though she has lost weight, I didn't want to go ahead and attempt to debridement this today. There is really no evidence of infection. The area behind the right popliteal fossa looks about the same as 2 weeks ago we have been using Santyl to this area. I did not look at her left foot which is wrapped been followed by podiatry at [facility] Patient is hypertensive.. Pulse regular and within target range for patient.Marland Kitchen Respirations regular, non-labored and within target range.. Temperature is normal and within the target range for the patient..  Patient looks tired and frail. She also looks as though she's lost weight. Vitals Time Taken: 4:25 PM, Height: 68 in, Weight: 96 lbs, BMI: 14.6, Temperature: 97.9 F, Pulse: 78 bpm, Respiratory Rate: 16 breaths/min, Blood Pressure: 141/108 mmHg. Eyes Conjunctivae clear. No discharge.Marland Kitchen Respiratory Respiratory effort is easy and symmetric bilaterally. Rate is normal at rest and on room air.. Cardiovascular Left foot is wrapped. Gastrointestinal (GI) Abdomen is soft and non-distended without masses or tenderness. Bowel sounds active in all quadrants.. No liver or spleen enlargement or tenderness.Marland Kitchen Psychiatric No evidence of depression, anxiety, or agitation. Calm, cooperative, and communicative. Appropriate interactions and affect.Marland Kitchen GAO, MITNICK ([id number]) General Notes: Wound exam; the patient's quarter-sized area in the right popliteal fossa still does not look particularly healthy. There is no evidence of infection covered in a surface slough. The area on her left visual tuberosity is a fairly substantial size covered in a surface slough. This is probably a stage II wound Integumentary (Hair, Skin) Wound #16 status is Open. Original  cause of wound was Pressure Injury. 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 The wound measures 2.5cm length x 2.8cm width x 0.2cm depth; 5.498cm^2 area and 1.1cm^3 volume. There is fat exposed. There is a large amount of serosanguineous drainage noted. The wound margin is distinct with the outline attached to the wound base. There is small (1-33%) red, pink granulation within the wound bed. There is a large (67-100%) amount of necrotic tissue within the wound bed including Eschar and Adherent Slough. The periwound skin appearance exhibited: Localized Edema, Moist, Erythema. The surrounding wound skin color is noted with erythema which is circumferential. Periwound temperature was noted as No Abnormality. The  periwound has tenderness on palpation. Wound #18 status is Open. Original cause of wound was Pressure Injury. The wound is located on the Left Ischial Tuberosity. The wound measures 4.5cm length x 3.8cm width x 0.1cm depth; 13.43cm^2 area and 1.343cm^3 volume. The wound is limited to skin breakdown. There is a large amount of serous drainage noted. The wound margin is flat and intact. There is medium (34-66%) granulation within the wound bed. There is a medium (34-66%) amount of necrotic tissue within the wound bed including Adherent Slough. The periwound skin appearance exhibited: Moist, Erythema. The periwound skin appearance did not exhibit: Callus, Crepitus, Excoriation, Fluctuance, Friable, Induration, Localized Edema, Rash, Scarring, Dry/Scaly, Maceration, Atrophie Blanche, Cyanosis, Ecchymosis, Hemosiderin Staining, Mottled, Pallor, Rubor. The surrounding wound skin color is noted with erythema which is circumferential. 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 ([id number]) 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 Cleanse wound with mild soap and water Wound #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: Clean wound with Normal Saline. Cleanse wound with mild soap and water Wound #18 Left Ischial Tuberosity: Clean wound with Normal Saline. Cleanse wound with mild soap  and water Wound #9 Left Calcaneous: Clean wound with Normal Saline. Cleanse wound with mild soap and water Primary Wound Dressing: 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,Posterior Amputation Site - Below Knee: Santyl Ointment Wound #18 Left Ischial Tuberosity: Santyl Ointment Secondary Dressing: 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,Posterior Amputation Site - Below Knee: Boardered Foam Dressing Wound #18 Left Ischial Tuberosity: Boardered Foam Dressing Dressing Change Frequency: 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 Wound #18 Left Ischial Tuberosity: Change dressing every day. 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 Other: - as patient is able Wound #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: Return Appointment in 2 weeks. Other: - as patient is able Wound #18 Left Ischial Tuberosity: Return Appointment in 2 weeks. Other: - as patient is able Wound #9 Left Calcaneous: Return Appointment in 2 weeks. Other: - as patient is able Medications-please add to medication list.: 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,Posterior Amputation Site - Below Knee: Santyl Enzymatic Ointment Wound #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: Santyl Enzymatic Ointment Wound #18 Left Ischial Tuberosity: Santyl Enzymatic Ointment Wound #9 Left Calcaneous: Santyl Enzymatic  Ointment Bullinger, Ravan N. ([id number]) continue santyul in both wound areas. I did not proceed with debridement today as the patient looks too frail. I think heeling of either of the two sites Im looking at is [employer] Electronic Signature(s) Signed: 06/04/2016 12:55:33 PM By: Baltazar Najjarobson, [name] MD Entered By: Baltazar Najjarobson, [name] on 06/03/2016 17:05:48 Diana EvesBLACKBURN, Rosha N. ([id number]) -------------------------------------------------------------------------------- SuperBill Details Shanon PayorBLACKBURN, Solae Date of Service: 06/03/2016 Patient Name: N. Patient Account Number:  [id number] Medical Record Treating RN: Clover MealyAfful, RN, [name], Sevier SinkRita [id number] Number: Other Clinician: 1981/03/09 (34 y.o. 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 native arteries of extremities with gangrene, right leg L89.622 Pressure ulcer of left heel, stage 2 Z89.511 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, right lower extremity S71.101A Unspecified open wound, right thigh, initial encounter L89.320 Pressure ulcer of left buttock, unstageable Facility Procedures CPT4 Code: [id number] Description: 99214 - WOUND CARE VISIT-LEV 4 EST PT Modifier: Quantity: 1 Physician Procedures CPT4 Code: [id number] Description: 99213 - WC PHYS LEVEL 3 - EST PT ICD-10 Description Diagnosis L89.320 Pressure ulcer of left buttock, unstageable Modifier: Quantity: 1 Electronic Signature(s) Signed: 06/04/2016 3:26:10 PM By: Elliot GurneyWoody, RN, [name], Kim RN, [name] Previous Signature: 06/04/2016 12:55:33 PM Version By: Baltazar Najjarobson, [name] MD Entered By: Elliot GurneyWoody, RN, [name], Kim on 06/04/2016 13:08:35

## 2016-06-10 ENCOUNTER — Encounter: Payer: Medicare Other | Admitting: Internal Medicine

## 2016-06-10 DIAGNOSIS — E10621 Type 1 diabetes mellitus with foot ulcer: Secondary | ICD-10-CM | POA: Diagnosis not present

## 2016-06-11 ENCOUNTER — Other Ambulatory Visit
Admission: RE | Admit: 2016-06-11 | Discharge: 2016-06-11 | Disposition: A | Discharge status: Home / Self Care | Payer: Medicare Other | Source: Ambulatory Visit | Attending: Internal Medicine | Admitting: Internal Medicine

## 2016-06-11 DIAGNOSIS — T148 Other injury of unspecified body region: Secondary | ICD-10-CM | POA: Insufficient documentation

## 2016-06-11 NOTE — Progress Notes (Signed)
[name], [name] ([id number]) Visit Report for 06/10/2016 Chief Complaint Document Details [name], [name] Date of Service: 06/10/2016 3:45 PM Patient Name: N. Patient Account Number: [id number] Medical Record Treating RN: Montey Hora [id number] Number: Other Clinician: 08/04/1981 (35 y.o. Treating Lavetta Geier, [place] Date of Birth/Sex: Female) Physician/Extender: G [place] Physician: Referring Physician: Nettie Elm in Treatment: 40 Information Obtained from: Patient Chief Complaint Patient in today for treatment of non-healing wound and HBO Treatment. she has just gotten out of hospital this week and is back to resume her hyperbaric oxygen therapy Electronic Signature(s) Signed: 06/11/2016 7:42:49 AM By: Linton Ham MD Entered By: Linton Ham on 06/10/2016 21:08:09 [name] ([id number]) -------------------------------------------------------------------------------- Debridement Details [name] Date of Service: 06/10/2016 3:45 PM Patient Name: N. Patient Account Number: [id number] Medical Record Treating RN: Montey Hora [id number] Number: Other Clinician: 1981/04/29 (35 y.o. Treating Luanne Krzyzanowski, [place] Date of Birth/Sex: Female) Physician/Extender: G Primary Care Johny [place] Physician: Referring Physician: Nettie Elm in Treatment: 40 Debridement Performed for Wound #18 Left Ischial Tuberosity Assessment: Performed By: Physician Ricard Dillon, MD Debridement: Debridement Pre-procedure Yes - 16:38 Verification/Time Out Taken: Start Time: 16:39 Pain Control: Lidocaine 4% Topical Solution Level: [name]/Subcutaneous Tissue Total Area Debrided (L x 5 (cm) x 3.6 (cm) = 18 (cm) W): Tissue and other Viable, Non-Viable, Eschar, Fibrin/Slough, Subcutaneous material debrided: Instrument: Blade, Forceps Bleeding: Minimum Hemostasis Achieved: Silver Nitrate End Time: 16:41 Procedural Pain: 0 Post  Procedural Pain: 0 Response to Treatment: Procedure was tolerated well Post Debridement Measurements of Total Wound Length: (cm) 5 Stage: Category/Stage II Width: (cm) 3.6 Depth: (cm) 0.2 Volume: (cm) 2.827 Character of Wound/Ulcer Post Improved Debridement: Severity of Tissue Post Fat layer exposed Debridement: Post Procedure Diagnosis Same as Pre-procedure [name], [name] ([id number]) Electronic Signature(s) Signed: 06/11/2016 7:42:49 AM By: Linton Ham MD Signed: 06/11/2016 4:30:55 PM By: Montey Hora Previous Signature: 06/10/2016 5:51:34 PM Version By: Montey Hora Entered By: Linton Ham on 06/10/2016 21:07:45 [name] ([id number]) -------------------------------------------------------------------------------- HPI Details [name] Date of Service: 06/10/2016 3:45 PM Patient Name: N. Patient Account Number: [id number] Medical Record Treating RN: Montey Hora [id number] Number: Other Clinician: 1981-05-04 (35 y.o. Treating Dhyana Bastone, [place] Date of Birth/Sex: Female) Physician/Extender: G [place] Physician: Referring Physician: Nettie Elm in Treatment: 40 History of Present Illness Location: dry gangrene both feet and heels Quality: Patient reports No Pain. Severity: Patient states wound are getting worse. Duration: Patient has had the wound for > 86month prior to seeking treatment at the wound center Context: The wound appeared gradually over time Modifying Factors: she has been in and out of hospital over the last 2 months Associated Signs and Symptoms: Patient reports having difficulty standing for long periods. HPI Description: [name] a 35y.o. female who presents to our wound center, back in June 2016, referred by her PCP Dr. OKym Groomfor nonhealing ulcers on the lateral aspect of the right heel. Of note she has a history of type 1 diabetes mellitus that has been uncontrolled. Past  medical history significant for type 1 diabetes mellitus not controlled, ankylosing spondylitis, anorexia nervosa, irritable bowel syndrome, chronic kidney disease, chronic diarrhea. she then developed gangrene of both feet due to severe peripheral vascular disease and also had gangrene of the tips of her fingers due to upper extremity vascular disease. [facility]. She started with hyperbaric oxygen therapy and had  a total of 40 treatments the last one being on 06/20/2015. After the initial treatment of hyperbaric oxygen therapy she started having ear problems and had ultimately to use myringotomy tubes and this was done bilaterally. Since then her ears have been doing fine. In late September, she had seen vascular and hand surgeons. since then she's been in [place] at the rec center for surgery involving extensive vascular procedures for the upper extremities. She was then at [facility] with GI bleeds both upper and lower and has been in and out of hospital for that. She has recently been out of hospital for the last week. 09/09/2015 -- she was unable to get here in time to start her hyperbaric oxygen today and hence is only here for a wound care visit. 09/19/2015 -- she has been having vancomycin during her dialysis and continues to have vascular appointments and the procedure is been set for early January. She has been unable to make it for her hemodialysis due to various medical symptoms. 09/30/2014 -- her vancomycin was stopped on 09/25/2015 and the mother has noticed the right foot has started draining for the last 3 days. Addendum: after examining the patient I was able to talk to her primary vascular surgeon Dr. Andree Elk at the [place] hospital. I told him about the necrotic area on the plantar aspect of right foot which is now wet gangrene and he agreed with me that he would admit her at [facility] under her care and synchronize further treatment. We have discussed her poor prognosis and he and I discussed the need for hospice care [facility], [name] ([id number]) and for sitting down and talking to the patient and her mother and giving them a proper detail of the prognosis. 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 She was worked up with MRIs, abdomen aortogram and placement of left-sided angioplasties were done. After a prolonged hospital she she was discharged home and was told to wear shrinker sock and stump protector and see her surgeon for further instructions regarding wound care and suture removal. Asked to take long-term doxycycline. 01/15/16; this is a patient I haven't seen before although she is been followed by Dr. Con Memos in this clinic today. She is a type I diabetic with severe PAD macrovascular disease. She has had a previous BKA. She has dry gangrene of the tips of her fingers which she showed me on the right to. She also has dry gangrene of the left first second and third toes and a portion of her proximal foot around these areas. She is followed by vascular surgery at [place] and saw them recently they are not going to do surgery as of yet. She has a large black eschar over her heel which is beginning to separate in some areas. As I understand think she is paining these with Betadine. There is been some suggestion about retrying hyperbarics on her although she is still not able to commit to the frequency of treatment that would be necessary to see improvement. She is also  on Monday Wednesday Friday dialysis 01/21/16; the patient returns to see me today with regards to the left heel.  Apparently she is not scheduled for any further attempts at revascularization of the left foot. 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 01/28/16; the patient returns today for attention to the left foot specifically the left calcaneal ulcer. This is covered in a thick black eschar. I crosshatched this last week and we have been using Santyl. 02/05/16; I continue to work on the thick black eschar on the patient's left calcaneus. She is using Santyl that this side crosshatched this area and the eschar is beginning to loosen. 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 This is beginning to separate and liquefy as well especially between the first and second toes. In the time being her major complaint is fatigue at dialysis 02/26/16 I continue to work on the patient's left calcaneus thick adherent black eschar. I crosshatched this area and we have been using Santyl although it is very adherent area I remove some nonviable tissue today what I can see of this actually looks surprisingly good. On the same foot she has dry gangrene on the first and second toes and part of the forefoot underneath this. This is beginning to separate. 03/11/16; the patient comes in for her every 2 week appointment. I have been working on the black eschar on her heel. The patient apparently again has to come off dialysis early today after 2 hours due to severe complaints of nausea. She really does not look well. 04/02/2016 -- the patient has not been seen here for about 3 weeks now and has a new issue with the stump of the right amputation site and also her right posterior thigh. They have only noticed this for  the last couple of days. 04/28/16; I had received a call from the patient's surgeon at [place]. She had a left transmetatarsal amputation and Integra applied to the left heel. Both of these areas appear to be doing well. There are dressing these with [facility] and they will return to their surgeon on Thursday. She has a new injury on the popliteal fossa on the right which I think was trauma from her stump. 05/20/16; the patient is following with her surgeon at [place]. She's had a left transmetatarsal debridement of the left heel she had Integra place and apparently is using some consternation of Epson salts soaks, Betadine and Aquacel Ag. The left leg is wrapped I did not look at this today. She has the wound in her right popliteal fossa which is apparently a pressure area possibly related to sitting on a toilet for 2-3 hours multiple times a day. 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 We have been using Santyl to the area on the right popliteal fossa. She has Bellizzi, Charly N. ([id number]) a new wound on her right gluteal area but the patient would not let me look at that today 06/03/16; patient is not doing particularly well. She now has a pressure area over her left ischial tuberosity. This is of quite a size and covered with an adherent surface slough. She looks as though she has lost weight, I didn't want to go ahead and attempt to debridement this today. There is really no evidence of infection. The area behind the right popliteal fossa looks about the same as  2 weeks ago we have been using Santyl to this area. I did not look at her left foot which is wrapped been followed by podiatry at [place] 06/10/16; patient arrives in clinic actually looking a lot brighter than I usually see her, predictably she did not go to dialysis today. For the first time in perhaps 6 weeks I actually saw her left foot today.  The transmetatarsal site is healed. The left heel has a reasonably stable-looking wound which has a clean base. Some eschar superiorly and a few sutures remain in place. More worrisome is an area on her lateral left foot over the metatarsal head. Quarter size necrotic wound that probes to bone. There is some purulence here which I cultured there is nothing that looks like a healthy base of this area. They've been using silver alginate to this area at home. She also has a large wound in the right popliteal fossa, and again a pressure area over the left ischial tuberosity. We are using Santyl to both of these areas. Both allays looks somewhat better than last week, using Santyl to both areas Electronic Signature(s) Signed: 06/11/2016 7:42:49 AM By: Linton Ham MD Entered By: Linton Ham on 06/10/2016 21:11:58 [name] ([id number]) -------------------------------------------------------------------------------- Physical Exam Details [name] Date of Service: 06/10/2016 3:45 PM Patient Name: N. Patient Account Number: [id number] Medical Record Treating RN: Montey Hora [id number] Number: Other Clinician: 1981/01/27 (35 y.o. Treating Kaela Beitz, [place] Date of Birth/Sex: Female) Physician/Extender: G [place] Physician: Referring Physician: Nettie Elm in Treatment: 40 Constitutional Sitting or standing Blood Pressure is within target range for patient.. Pulse regular and within target range for patient.Marland Kitchen Respirations regular, non-labored and within target range.. Temperature is normal and within the target range for the patient.. Patient's appearance is neat and clean. Appears in no acute distress. Well nourished and well developed.. Eyes Conjunctivae clear. No discharge.. Notes Wound exam; the patient's quarter-sized area in the right popliteal fossa and the area over her left ischial tuberosity both looks somewhat improved  although we will certainly need to continue Santyl. Large area of her Achilles portion of her calcaneus has a superficial wound this does not look particularly ominous and is certainly better than last time I saw this area. The most worrisome issue today that I was not aware of his the area on the lateral aspect of her foot at roughly the fifth metatarsal head this is a deep probing wound. I think there is necrotic tissue as well as some purulence which I've cultured. This is being managed by her surgeon at [facility]) Signed: 06/11/2016 7:42:49 AM By: Linton Ham MD Entered By: Linton Ham on 06/10/2016 21:14:28 [name] ([id number]) -------------------------------------------------------------------------------- Physician Orders Details [name] Date of Service: 06/10/2016 3:45 PM Patient Name: N. Patient Account Number: [id number] Medical Record Treating RN: Montey Hora [id number] Number: Other Clinician: 1981-08-24 (35 y.o. Treating Leveta Wahab, [place] Date of Birth/Sex: Female) Physician/Extender: G [place] Physician: Referring Physician: Nettie Elm in Treatment: 84 Verbal / Phone Orders: Yes Clinician: Montey Hora Read Back and Verified: Yes Diagnosis Coding Wound Cleansing 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,Posterior Amputation Site - Below Knee o Clean wound with Normal Saline. o Cleanse wound with mild soap and water Wound #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 o Clean wound with Normal Saline. o Cleanse wound with mild soap and water Wound #18 Left Ischial Tuberosity o Clean wound with Normal Saline. o Cleanse wound with mild soap and water Wound #9 Left  Calcaneous o Clean wound with Normal Saline. o Cleanse wound with mild soap and water Primary Wound Dressing 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,Posterior Amputation Site - Below Knee o Santyl Ointment Wound #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 o  Aquacel Ag Wound #18 Left Ischial Tuberosity o Santyl Ointment Wound #9 Left Calcaneous o Aquacel Ag Secondary Dressing 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,Posterior Amputation Site - Below Knee o Boardered Foam Dressing [name], [name] ([id number]) Wound #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 o ABD and Kerlix/Conform Wound #18 Left Ischial Tuberosity o Boardered Foam Dressing Wound #9 Left Calcaneous o ABD and Kerlix/Conform Dressing Change Frequency 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,Posterior Amputation Site - Below Knee o Change dressing every day. Wound #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 o Change dressing every day. Wound #18 Left Ischial Tuberosity o Change dressing every day. Wound #9 Left Calcaneous o Change dressing every day. Follow-up Appointments 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,Posterior Amputation Site - Below Knee o Return Appointment in 2 weeks. o Other: - as patient is able Wound #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 o Return Appointment in 2 weeks. o Other: - as patient is able Wound #18 Left Ischial Tuberosity o Return Appointment in 2 weeks. o Other: - as patient is able Wound #9 Left Calcaneous o Return Appointment in 2 weeks. o Other: - as patient is able Medications-please add to medication list. 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,Posterior Amputation Site - Below Knee o Santyl Enzymatic Ointment Wound #18 Left Ischial Tuberosity o Santyl Enzymatic Ointment Laboratory [name], [name] ([id number]) o Bacteria identified in Wound by Culture (MICRO) - left trans met amp site oooo LOINC Code: 2836-6 oooo Convenience Name: Wound culture routine Electronic Signature(s) Signed: 06/10/2016 5:51:34 PM By: Montey Hora Signed: 06/11/2016 7:42:49 AM By: Linton Ham MD Entered By: Montey Hora on 06/10/2016 [place], [place] ([id number]) -------------------------------------------------------------------------------- Problem List  Details [name] Date of Service: 06/10/2016 3:45 PM Patient Name: N. Patient Account Number: [id number] Medical Record Treating RN: Montey Hora [id number] Number: Other Clinician: 1981/09/03 (35 y.o. Treating Lyncoln Maskell, [place] Date of Birth/Sex: Female) Physician/Extender: G [place] Physician: Referring Physician: Nettie Elm in Treatment: 63 Active Problems ICD-10 Encounter Code Description Active Date Diagnosis E10.621 Type 1 diabetes mellitus with foot ulcer 08/30/2015 Yes E10.52 Type 1 diabetes mellitus with diabetic peripheral 08/30/2015 Yes angiopathy with gangrene I70.245 Atherosclerosis of native arteries of left leg with ulceration 08/30/2015 Yes of other part of foot I70.261 Atherosclerosis of native arteries of extremities with 08/30/2015 Yes gangrene, right leg L89.622 Pressure ulcer of left heel, stage 2 08/30/2015 Yes Z89.511 Acquired absence of right leg below knee 11/29/2015 Yes T87.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, right lower extremity 04/02/2016 Yes S71.101A Unspecified open wound, right thigh, initial encounter 04/02/2016 Yes L89.322 Pressure ulcer of left buttock, stage 2 06/10/2016 Yes [place], Fancy N. ([id number]) Inactive Problems Resolved Problems ICD-10 Code Description Active Date Resolved Date L02.611 Cutaneous abscess of right foot 10/01/2015 10/01/2015 Electronic Signature(s) Signed: 06/11/2016 7:42:49 AM By: Linton Ham MD Entered By: Linton Ham on 06/10/2016 21:18:20 [name] ([id number]) -------------------------------------------------------------------------------- Progress Note Details [name] Date of Service: 06/10/2016 3:45 PM Patient Name: N. Patient Account Number: [id number] Medical Record Treating RN: Montey Hora [id number] Number: Other Clinician: Oct 18, 1980 (35 y.o. Treating Gabreil [place], [place] Date of Birth/Sex: Female) Physician/Extender: G [place] Physician: Referring Physician: Nettie Elm in Treatment: 40 Subjective Chief Complaint Information obtained from Patient Patient in today for treatment of non-healing wound and HBO Treatment. she has just gotten out of hospital  this week and is back to resume her hyperbaric oxygen therapy History of Present Illness (HPI) The following HPI elements were documented for the patient's wound: Location: dry gangrene both feet and heels Quality: Patient reports No Pain. Severity: Patient states wound are getting worse. Duration: Patient has had the wound for > 63month prior to seeking treatment at the wound center Context: The wound appeared gradually over time Modifying Factors: she has been in and out of hospital over the last 2 months Associated Signs and Symptoms: Patient reports having difficulty standing for long periods. HElsie Wu a 35y.o. female who presents to our wound center, back in June 2016, referred by her PCP Dr. OKym Groomfor nonhealing ulcers on the lateral aspect of the right heel. Of note she has a history of type 1 diabetes mellitus that has been uncontrolled. Past medical history significant for type 1 diabetes mellitus not controlled, ankylosing spondylitis, anorexia nervosa, irritable bowel syndrome, chronic kidney disease, chronic diarrhea. she then developed gangrene of both feet due to severe peripheral vascular disease and also had gangrene of the tips of her fingers due to upper extremity vascular disease. [facility] [place]. She started with hyperbaric oxygen therapy and had a total of 40 treatments the last one being on 06/20/2015. After the initial treatment of hyperbaric oxygen therapy she started having ear problems and had ultimately to use myringotomy tubes and this was done bilaterally. Since then her ears have been doing fine. In late September,  she had seen vascular and hand surgeons. [place] [facility] [place] [facility] vascular procedures for the upper extremities. [facility] and has been in and out of hospital for that. She has recently been out of hospital for the last week. 09/09/2015 -- she was unable to get here in time to start her hyperbaric oxygen today and hence is only here for a wound care visit. 09/19/2015 -- she has been having vancomycin during her dialysis and continues to have vascular [name], [name] ([id number] appointments and the procedure is been set for early January. She has been unable to make it for her hemodialysis due to various medical symptoms. 09/30/2014 -- her vancomycin was stopped on 09/25/2015 and the mother has noticed the right foot has started draining for the last 3 days. Addendum: after examining the patient I was able to talk to her primary vascular [facility] I told him about the necrotic area on the plantar aspect of right foot which is now wet gangrene and he agreed with me [facility] further treatment. We have discussed her poor prognosis and he and I discussed the need for hospice care and for sitting down and talking to the patient and her mother and giving them a proper detail of the prognosis. 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 She was worked up with MRIs, abdomen aortogram  and placement of  left-sided angioplasties were done. After a prolonged hospital she she was discharged home and was told to wear shrinker sock and stump protector and see her surgeon for further instructions regarding wound care and suture removal. Asked to take long-term doxycycline. 01/15/16; this is a patient I haven't seen before although she is been followed by Dr. Con Memos in this clinic today. She is a type I diabetic with severe PAD macrovascular disease. She has had a previous BKA. She has dry gangrene of the tips of her fingers which she showed me on the right to. She also has dry gangrene of the left first second and third toes and a portion of her proximal foot around these areas. She is followed by vascular surgery at [place] and saw them recently they are not going to do surgery as of yet. She has a large black eschar over her heel which is beginning to separate in some areas. As I understand think she is paining these with Betadine. There is been some suggestion about retrying hyperbarics on her although she is still not able to commit to the frequency of treatment that would be necessary to see improvement. She is also on Monday Wednesday Friday dialysis 01/21/16; the patient returns to see me today with regards to the left heel. Apparently she is not scheduled for any further attempts at revascularization of the left foot. 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 01/28/16; the patient returns today for attention to the left foot specifically the left calcaneal ulcer. This is covered in a thick black eschar. I crosshatched this last week and we have been using Santyl. 02/05/16; I continue to work on the thick black eschar on the patient's left calcaneus. She is using Santyl that this side crosshatched this area and the eschar is beginning to loosen. 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 This is beginning to separate and liquefy as well especially between the first and second toes. In the time being her major complaint is fatigue at dialysis 02/26/16 I continue to work on the patient's left calcaneus thick adherent black eschar. I crosshatched this area and we have been using Santyl although it is very adherent area I remove some nonviable tissue today what I can see of this actually looks surprisingly good. On the same foot she has dry gangrene on the first and second toes and part of the forefoot underneath this. This is beginning to separate. 03/11/16; the patient comes in for her every 2 week appointment. I have been working on the black eschar on her heel. The patient apparently again has to come off dialysis early today after 2 hours due to severe complaints of nausea. She really does not look well. 04/02/2016 -- the patient has not been seen here for about 3 weeks now and has a new issue with the stump of the right amputation site and also her right posterior thigh. They have only noticed this for the last couple of days. 04/28/16; I had received a call from the patient's surgeon at [place]. She had a left transmetatarsal amputation Smola, Paxton N. ([id number]) and Integra applied to the left heel. Both of these areas appear to be doing well. There are dressing these with [facility] and they will return  to their surgeon on Thursday. She has a new injury on the popliteal fossa on the right which I think was trauma from her stump. 05/20/16; the patient is following with her surgeon at [place]. She's had a left transmetatarsal debridement of the left heel she had Integra place and apparently is using some consternation of Epson salts soaks, Betadine and Aquacel Ag. The left leg is wrapped I did not look at this today. She has the wound in her right popliteal fossa which is  apparently a pressure area possibly related to sitting on a toilet for 2-3 hours multiple times a day. 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 We have been using Santyl to the area on the right popliteal fossa. She has a new wound on her right gluteal area but the patient would not let me look at that today 06/03/16; patient is not doing particularly well. She now has a pressure area over her left ischial tuberosity. This is of quite a size and covered with an adherent surface slough. She looks as though she has lost weight, I didn't want to go ahead and attempt to debridement this today. There is really no evidence of infection. The area behind the right popliteal fossa looks about the same as 2 weeks ago we have been using Santyl to this area. I did not look at her left foot which is wrapped been followed by podiatry at [place] 06/10/16; patient arrives in clinic actually looking a lot brighter than I usually see her, predictably she did not go to dialysis today. For the first time in perhaps 6 weeks I actually saw her left foot today. The transmetatarsal site is healed. The left heel has a reasonably stable-looking wound which has a clean base. Some eschar superiorly and a few sutures remain in place. More worrisome is an area on her lateral left foot over the metatarsal head. Quarter size necrotic wound that probes to bone. There is some purulence here which I cultured there is nothing that looks like a healthy base of this area. They've been using silver alginate to this area at home. She also has a large wound in the right popliteal fossa, and again a pressure area over the left ischial tuberosity. We are using Santyl to both of these areas. Both allays looks somewhat better than last week, using Santyl to both areas Objective Constitutional Sitting or standing Blood Pressure is within target range for patient.. Pulse  regular and within target range for patient.Marland Kitchen Respirations regular, non-labored and within target range.. Temperature is normal and within the target range for the patient.. Patient's appearance is neat and clean. Appears in no acute distress. Well nourished and well developed.. Vitals Time Taken: 4:15 PM, Height: 68 in, Weight: 96 lbs, BMI: 14.6, Temperature: 98.0 F, Pulse: 71 bpm, Respiratory Rate: 16 breaths/min, Blood Pressure: 132/92 mmHg. Eyes Conjunctivae clear. No discharge.. General Notes: Wound exam; the patient's quarter-sized area in the right popliteal fossa and the area over her left ischial tuberosity both looks somewhat improved although we will certainly need to continue Santyl. Large area of her Achilles portion of her calcaneus has a superficial wound this does not look particularly Hoglund, [name] ([id number]) ominous and is certainly better than last time I saw this area. The most worrisome issue today that I was not aware of his the area on the lateral aspect of her foot at roughly the fifth metatarsal head this is a  deep probing wound. I think there is necrotic tissue as well as some purulence which I've cultured. This is being managed by her surgeon at [place] ([name], [name]) Wound #16 status is Open. Original cause of wound was Pressure Injury. 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 The wound measures 3.5cm length x 2.4cm width x 0.4cm depth; 6.597cm^2 area and 2.639cm^3 volume. There is fat exposed. There is no tunneling or undermining noted. There is a large amount of serosanguineous drainage noted. The wound margin is distinct with the outline attached to the wound base. There is small (1-33%) red, pink granulation within the wound bed. There is a large (67-100%) amount of necrotic tissue within the wound bed including Eschar and Adherent Slough. The periwound [name] appearance exhibited: Localized Edema, Erythema. The  periwound [name] appearance did not exhibit: Callus, Crepitus, Excoriation, Fluctuance, Friable, Induration, Rash, Scarring, Dry/Scaly, Maceration, Moist, Atrophie Blanche, Cyanosis, Ecchymosis, Hemosiderin Staining, Mottled, Pallor, Rubor. The surrounding wound [name] color is noted with erythema which is circumferential. Periwound temperature was noted as No Abnormality. The periwound has tenderness on palpation. Wound #17 status is Open. Original cause of wound was Surgical Injury. The wound is located on the Left Amputation Site - Transmetatarsal. The wound measures 1.7cm length x 11.5cm width x 1cm depth; 15.355cm^2 area and 15.355cm^3 volume. The wound is limited to [name] breakdown. There is a medium amount of serous drainage noted. The wound margin is flat and intact. There is large (67-100%) red granulation within the wound bed. There is a small (1-33%) amount of necrotic tissue within the wound bed including Adherent Slough. The periwound [name] appearance exhibited: Moist. The periwound [name] appearance did not exhibit: Callus, Crepitus, Excoriation, Fluctuance, Friable, Induration, Localized Edema, Rash, Scarring, Dry/Scaly, Maceration, Atrophie Blanche, Cyanosis, Ecchymosis, Hemosiderin Staining, Mottled, Pallor, Rubor, Erythema. Wound #18 status is Open. Original cause of wound was Pressure Injury. The wound is located on the Left Ischial Tuberosity. The wound measures 5cm length x 3.6cm width x 0.1cm depth; 14.137cm^2 area and 1.414cm^3 volume. The wound is limited to [name] breakdown. There is no tunneling or undermining noted. There is a large amount of serous drainage noted. The wound margin is flat and intact. There is medium (34-66%) granulation within the wound bed. There is a medium (34-66%) amount of necrotic tissue within the wound bed including Adherent Slough. The periwound [name] appearance exhibited: Moist, Erythema. The periwound [name] appearance did not exhibit: Callus, Crepitus,  Excoriation, Fluctuance, Friable, Induration, Localized Edema, Rash, Scarring, Dry/Scaly, Maceration, Atrophie Blanche, Cyanosis, Ecchymosis, Hemosiderin Staining, Mottled, Pallor, Rubor. The surrounding wound [name] color is noted with erythema which is circumferential. Wound #9 status is Open. Original cause of wound was Pressure Injury. The wound is located on the Left Calcaneous. The wound measures 4.5cm length x 3.7cm width x 0.1cm depth; 13.077cm^2 area and 1.308cm^3 volume. The wound is limited to [name] breakdown. There is a none present amount of drainage noted. The wound margin is flat and intact. There is medium (34-66%) red granulation within the wound bed. There is a medium (34-66%) amount of necrotic tissue within the wound bed including Eschar and Adherent Slough. The periwound [name] appearance exhibited: Moist. The periwound [name] appearance did not exhibit: Callus, Crepitus, Excoriation, Fluctuance, Friable, Induration, Localized Edema, Rash, Scarring, Dry/Scaly, Maceration, Atrophie Blanche, Cyanosis, Ecchymosis, Hemosiderin Staining, Mottled, Pallor, Rubor, Erythema. 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 There was a [name]/Subcutaneous Tissue Debridement ([id number]) debridement with total area of 18 sq cm performed by  Ricard Dillon, MD. with the following instrument(s): Blade and Forceps to remove Viable and Non-Viable tissue/material including Fibrin/Slough, Eschar, and Subcutaneous after achieving pain control using Lidocaine 4% Topical Solution. A time out was conducted at 16:38, prior to the start of the procedure. A Minimum amount of bleeding was controlled with Silver Nitrate. The procedure was tolerated well with a pain level of 0 throughout and a pain level of 0 following the procedure. Post Debridement Measurements: 5cm length x 3.6cm width x 0.2cm depth; 2.827cm^3 volume. Post debridement Stage noted as Category/Stage II. Character of Wound/Ulcer Post Debridement is improved. Severity of Tissue Post Debridement is: Fat layer exposed. Post procedure Diagnosis Wound #18: Same as Pre-Procedure Plan Wound Cleansing: 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 Cleanse wound with mild soap and water Wound #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: [place], Caci N. ([id number]) Clean wound with Normal Saline. Cleanse wound with mild soap and water Wound #18 Left Ischial Tuberosity: Clean wound with Normal Saline. Cleanse wound with mild soap and water Wound #9 Left Calcaneous: Clean wound with Normal Saline. Cleanse wound with mild soap and water Primary Wound Dressing: 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,Posterior Amputation Site - Below Knee: Santyl Ointment Wound #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: Aquacel Ag Wound #18 Left Ischial Tuberosity: Santyl Ointment Wound #9 Left Calcaneous: Aquacel Ag Secondary Dressing: 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,Posterior Amputation Site - Below Knee: Boardered Foam Dressing Wound #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: ABD and Kerlix/Conform Wound #18 Left Ischial Tuberosity: Boardered Foam Dressing Wound #9 Left Calcaneous: ABD and Kerlix/Conform Dressing Change Frequency: 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,Posterior Amputation Site  - Below Knee: Change dressing every day. Wound #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: Change dressing every day. Wound #18 Left Ischial Tuberosity: Change dressing every day. Wound #9 Left Calcaneous: Change dressing every day. 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 Other: - as patient is able Wound #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: Return Appointment in 2 weeks. Other: - as patient is able Wound #18 Left Ischial Tuberosity: Return Appointment in 2 weeks. Other: - as patient is able Wound #9 Left Calcaneous: Return Appointment in 2 weeks. Other: - as patient is able Medications-please add to medication list.: 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,Posterior Amputation Site - Below Knee: Santyl Enzymatic Ointment [name], [name] ([id number]) Wound #18 Left Ischial Tuberosity: Santyl Enzymatic Ointment Laboratory ordered were: Wound culture routine - left trans met amp site #1 the area on the right popliteal fossa and the right ischial tuberosity are pressure wounds and looks somewhat better than last week. We will continue Santyl-based dressings. #2 I agree with the silver alginate dressing to the left calcaneus #3 the area over the lateral aspect of her foot at roughly the base of her fifth metatarsal is a necrotic wound with some purulent drainage that I have cultured.  I'll wait for this result before calling antibiotics to dialysis. I think this area is going to need to be imaged and debrided. However we have not been managing this area Electronic Signature(s) Signed: 06/11/2016 7:42:49 AM By: Linton Ham MD Entered By: Linton Ham on 06/10/2016 21:16:54 [name] ([id number]) -------------------------------------------------------------------------------- SuperBill Details [name] Date of Service: 06/10/2016 Patient Name: N. Patient Account Number: [id number] Medical Record  Treating RN: Montey Hora [id number] Number: Other Clinician: 01-26-1981 (35 y.o. 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 I70.261 Atherosclerosis of native arteries of extremities with gangrene, right leg L89.622 Pressure ulcer of left heel, stage 2 Z89.511 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, right lower extremity S71.101A Unspecified open wound, right thigh, initial encounter L89.322 Pressure ulcer of left buttock, stage 2 Facility Procedures CPT4 Code: [id number] Description: [place] - DEB SUBQ TISSUE 20 SQ CM/< ICD-10 Description Diagnosis L89.322 Pressure ulcer of left buttock, stage 2 Modifier: Quantity: 1 Physician Procedures CPT4 Code: [id number] Description: 11042 - WC PHYS SUBQ TISS 20 SQ CM ICD-10 Description Diagnosis L89.322 Pressure ulcer of left buttock, stage 2 Modifier: Quantity: 1 Electronic Signature(s) Signed: 06/11/2016 7:42:49 AM By: Linton Ham MD Entered By: Linton Ham on 06/10/2016 21:18:51

## 2016-06-11 NOTE — Progress Notes (Addendum)
[name], [name] ([id number]) Visit Report for 06/10/2016 Arrival Information Details [name], [name] Date of Service: 06/10/2016 3:45 PM Patient Name: N. Patient Account Number: [id number] Medical Record Treating RN: Huel Coventry [id number] Number: Other Clinician: [birth year] (34 y.o. Treating [name], [name] Date of Birth/Sex: Female) Physician/Extender: G Primary Care [name] Physician: Referring Physician: [name] in Treatment: 40 Visit Information History Since Last Visit Added or deleted any medications: No Patient Arrived: Wheel Chair Any new allergies or adverse reactions: No Arrival Time: 16:12 Had a fall or experienced change in No activities of daily living that may affect Accompanied By: mom risk of falls: Transfer Assistance: Manual Signs or symptoms of abuse/neglect since last No Patient Identification Verified: Yes visito Secondary Verification Process Yes Hospitalized since last visit: No Completed: Has Dressing in Place as Prescribed: Yes Patient Requires Transmission-Based No Pain Present Now: No Precautions: Patient Has Alerts: Yes Electronic Signature(s) Signed: 06/10/2016 4:41:27 PM By: Elliot Gurney, RN, [name], Kim RN, [name] Entered By: Elliot Gurney, RN, [name], Kim on 06/10/2016 16:12:37 [name] ([id number]) -------------------------------------------------------------------------------- Encounter Discharge Information Details [name] Date of Service: 06/10/2016 3:45 PM Patient Name: N. Patient Account Number: [id number] Medical Record Treating RN: [name] [id number] Number: Other Clinician: 01-03-1981 (34 y.o. Treating [name], [name] Date of Birth/Sex: Female) Physician/Extender: G Primary Care [name] Physician: Referring Physician: [name] in Treatment: 51 Encounter Discharge Information Items Discharge Pain Level: 0 Discharge Condition: Stable Ambulatory Status: Wheelchair Discharge  Destination: Home Transportation: Private Auto Accompanied By: mom Schedule Follow-up Appointment: Yes Medication Reconciliation completed and provided to Patient/Care No [name]: Provided on Clinical Summary of Care: 06/10/2016 Form Type Recipient Paper Patient HB Electronic Signature(s) Signed: 06/10/2016 5:38:45 PM By: [name] Previous Signature: 06/10/2016 5:16:42 PM Version By: Gwenlyn Perking Entered By: [name] on 06/10/2016 17:38:45 [name] ([id number]) -------------------------------------------------------------------------------- Lower Extremity Assessment Details [name] Date of Service: 06/10/2016 3:45 PM Patient Name: N. Patient Account Number: [id number] Medical Record Treating RN: Huel Coventry [id number] Number: Other Clinician: 1981/05/24 (34 y.o. Treating [name], [name] Date of Birth/Sex: Female) Physician/Extender: G Primary Care [name] Physician: Referring Physician: [name] in Treatment: 40 Vascular Assessment Pulses: Posterior Tibial Dorsalis Pedis Palpable: [Right:Yes] Extremity colors, hair growth, and conditions: Extremity Color: [Right:Pale] Hair Growth on Extremity: [Right:Yes] Temperature of Extremity: [Right:Warm] Notes Left BKA; Right transmet Electronic Signature(s) Signed: 06/10/2016 4:41:27 PM By: Elliot Gurney, RN, [name], Kim RN, [name] Entered By: Elliot Gurney, RN, [name], Kim on 06/10/2016 16:21:07 [name], [name] ([id number]) -------------------------------------------------------------------------------- Multi Wound Chart Details [name] Date of Service: 06/10/2016 3:45 PM Patient Name: N. Patient Account Number: [id number] Medical Record Treating RN: Huel Coventry [id number] Number: Other Clinician: 02/27/1981 (34 y.o. Treating [name], [name] Date of Birth/Sex: Female) Physician/Extender: G Primary Care [name] Physician: Referring Physician: [name] in  Treatment: 40 Vital Signs Height(in): 68 Pulse(bpm): 71 Weight(lbs): 96 Blood Pressure 132/92 (mmHg): Body Mass Index(BMI): 15 Temperature(F): 98.0 Respiratory Rate 16 (breaths/min): Photos: [16:No Photos] [17:No Photos] [18:No Photos] Wound Location: [16:Right Amputation Site - Below Knee - Posterior] [17:Left Amputation Site - Left Ischial Tuberosity Transmetatarsal] Wounding Event: [16:Pressure Injury] [17:Surgical Injury] [18:Pressure Injury] Primary Etiology: [16:Pressure Ulcer] [17:Open Surgical Wound] [18:Pressure Ulcer] Comorbid History: [16:Anemia, Type I Diabetes, Anemia, Type I Diabetes, Anemia, Type I Diabetes, End Stage Renal Disease, End Stage Renal Disease, End Stage Renal Disease, Rheumatoid Arthritis, Neuropathy] [17:Rheumatoid Arthritis, Rheumatoid Arthritis,  Neuropathy] [18:Neuropathy] Date Acquired: [16:04/02/2016] [17:04/13/2016] [18:05/12/2016] Weeks of Treatment: [16:9] [17:6] [18:1] Wound Status: [16:Open] [17:Open] [18:Open] Measurements  L x W x D 3.5x2.4x0.4 [17:1.7x11.5x1] [18:5x3.6x0.1] (cm) Area (cm) : [16:6.597] [17:15.355] [18:14.137] Volume (cm) : [16:2.639] [17:15.355] [18:1.414] % Reduction in Area: [16:-11.10%] [17:-1728.00%] [18:-5.30%] % Reduction in Volume: -344.30% [17:-18179.80%] [18:-5.30%] Classification: [16:Category/Stage III] [17:Partial Thickness] [18:Category/Stage II] HBO Classification: [16:Grade 1] [17:Grade 1] [18:N/A] Exudate Amount: [16:Large] [17:Medium] [18:Large] Exudate Type: [16:Serosanguineous] [17:Serous] [18:Serous] Exudate Color: [16:red, brown] [17:amber] [18:amber] Wound Margin: [16:Distinct, outline attached Flat and Intact] [18:Flat and Intact] Granulation Amount: [16:Small (1-33%)] [17:Large (67-100%)] [18:Medium (34-66%)] Granulation Quality: [16:Red, Pink] [17:Red] [18:N/A] Necrotic Amount: Large (67-100%) Small (1-33%) Medium (34-66%) Necrotic Tissue: Eschar, Adherent Slough Adherent [employer] Exposed Structures: Fat: Yes Fascia: No Fascia: No Fat: No Fat: No Tendon: No Tendon: No Muscle: No Muscle: No Joint: No Joint: No Bone: No Bone: No Limited to Skin Limited to Skin Breakdown Breakdown Epithelialization: None None Small (1-33%) Periwound Skin Texture: Edema: Yes Edema: No Edema: No Excoriation: No Excoriation: No Excoriation: No Induration: No Induration: No Induration: No Callus: No Callus: No Callus: No Crepitus: No Crepitus: No Crepitus: No Fluctuance: No Fluctuance: No Fluctuance: No Friable: No Friable: No Friable: No Rash: No Rash: No Rash: No Scarring: No Scarring: No Scarring: No Periwound Skin Maceration: No Moist: Yes Moist: Yes Moisture: Moist: No Maceration: No Maceration: No Dry/Scaly: No Dry/Scaly: No Dry/Scaly: No Periwound Skin Color: Erythema: Yes Atrophie Blanche: No Erythema: Yes Atrophie Blanche: No Cyanosis: No Atrophie Blanche: No Cyanosis: No Ecchymosis: No Cyanosis: No Ecchymosis: No Erythema: No Ecchymosis: No Hemosiderin Staining: No Hemosiderin Staining: No Hemosiderin Staining: No Mottled: No Mottled: No Mottled: No Pallor: No Pallor: No Pallor: No Rubor: No Rubor: No Rubor: No Erythema Location: Circumferential N/A Circumferential Erythema Change: No Change N/A N/A Temperature: No Abnormality N/A N/A Tenderness on Yes No No Palpation: Wound Preparation: Ulcer Cleansing: Ulcer Cleansing: Ulcer Cleansing: Rinsed/Irrigated with Rinsed/Irrigated with Rinsed/Irrigated with Saline Saline Saline Topical Anesthetic Topical Anesthetic Topical Anesthetic Applied: Other: lidocaine Applied: Other: lidocaine Applied: Other: lidocaine 4% 4% 4% Wound Number: 9 N/A N/A Photos: No Photos N/A N/A Wound Location: Left Calcaneous N/A N/A Wounding Event: Pressure Injury N/A N/A 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 ([id number]) Comorbid  History: Anemia, Type I Diabetes, N/A N/A End Stage Renal Disease, Rheumatoid Arthritis, Neuropathy Date Acquired: 07/28/2015 N/A N/A Weeks of Treatment: 40 N/A N/A Wound Status: Open N/A N/A Measurements L x W x D 4.5x3.7x0.1 N/A N/A (cm) Area (cm) : 13.077 N/A N/A Volume (cm) : 1.308 N/A N/A % Reduction in Area: 29.60% N/A N/A % Reduction in Volume: 29.60% N/A N/A Classification: Grade 2 N/A N/A HBO Classification: N/A N/A N/A Exudate Amount: None Present N/A N/A Exudate Type: N/A N/A N/A Exudate Color: N/A N/A N/A Wound Margin: Flat and Intact N/A N/A Granulation Amount: Medium (34-66%) N/A N/A Granulation Quality: Red N/A N/A Necrotic Amount: Medium (34-66%) N/A N/A Necrotic Tissue: Eschar, Adherent Slough N/A N/A Exposed Structures: Fascia: No N/A N/A Fat: No Tendon: No Muscle: No Joint: No Bone: No Limited to Skin Breakdown Epithelialization: None N/A N/A Periwound Skin Texture: Edema: No N/A N/A Excoriation: No Induration: No Callus: No Crepitus: No Fluctuance: No Friable: No Rash: No Scarring: No Periwound Skin Moist: Yes N/A N/A Moisture: Maceration: No Dry/Scaly: No Periwound Skin Color: Atrophie Blanche: No N/A N/A Cyanosis: No Ecchymosis: No Erythema: No Hemosiderin Staining: No Mottled: No [name], [name] ([id number]) Pallor: No Rubor: No Erythema Location: N/A N/A N/A Erythema Change: N/A N/A N/A Temperature: N/A N/A  N/A Tenderness on No N/A N/A Palpation: Wound Preparation: Ulcer Cleansing: N/A N/A Rinsed/Irrigated with Saline Topical Anesthetic Applied: None Treatment Notes Electronic Signature(s) Signed: 06/10/2016 4:41:27 PM By: Elliot GurneyWoody, RN, [name], Kim RN, [name] Entered By: Elliot GurneyWoody, RN, [name], Kim on 06/10/2016 96:04:5416:28:28 [name] EvesBLACKBURN, [name] ([id number]) -------------------------------------------------------------------------------- Multi-Disciplinary Care Plan Details [name] PayorBLACKBURN, Anne Date of Service: 06/10/2016 3:45  PM Patient Name: N. Patient Account Number: [id number] Medical Record Treating RN: Huel CoventryWoody, Kim [id number] Number: Other Clinician: Jul 25, 1981 (34 y.o. Treating [name], [name] Date of Birth/Sex: Female) Physician/Extender: G Primary Care Rolin Barrylmedo, Mario Physician: Referring Physician: [name] Partridgelmedo, Mario Weeks in Treatment: 40 Active Inactive HBO Nursing Diagnoses: Anxiety related to feelings of confinement associated with the hyperbaric oxygen chamber Anxiety related to knowledge deficit of hyperbaric oxygen therapy and treatment procedures Discomfort related to temperature and humidity changes inside hyperbaric chamber Potential for barotraumas to ears, sinuses, teeth, and lungs or cerebral gas embolism related to changes in atmospheric pressure inside hyperbaric oxygen chamber Potential for oxygen toxicity seizures related to delivery of 100% oxygen at an increased atmospheric pressure Potential for pulmonary oxygen toxicity related to delivery of 100% oxygen at an increased atmospheric pressure Goals: Barotrauma will be prevented during HBO2 Date Initiated: 08/30/2015 Goal Status: Active Patient and/or family will be able to state/discuss factors appropriate to the management of their disease process during treatment Date Initiated: 08/30/2015 Goal Status: Active Patient will tolerate the hyperbaric oxygen therapy treatment Date Initiated: 08/30/2015 Goal Status: Active Patient will tolerate the internal climate of the chamber Date Initiated: 08/30/2015 Goal Status: Active Patient/caregiver will verbalize understanding of HBO goals, rationale, procedures and potential hazards Date Initiated: 08/30/2015 Goal Status: Active Signs and symptoms of pulmonary oxygen toxicity will be recognized and promptly addressed Date Initiated: 08/30/2015 [name] EvesBLACKBURN, [name] ([id number]) Goal Status: Active Signs and symptoms of seizure will be recognized and promptly addressed ; seizing patients  will suffer no harm Date Initiated: 08/30/2015 Goal Status: Active Interventions: Administer a five (5) minute air break for patient if signs and symptoms of seizure appear and notify the hyperbaric physician Administer a ten (10) minute air break for patient if signs and symptoms of seizure appear and notify the hyperbaric physician Administer decongestants, per physician orders, prior to HBO2 Administer the correct therapeutic gas delivery based on the patients needs and limitations, per physician order Assess and provide for patientos comfort related to the hyperbaric environment and equalization of middle ear Assess for signs and symptoms related to adverse events, including but not limited to confinement anxiety, pneumothorax, oxygen toxicity and baurotrauma Assess patient for any history of confinement anxiety Assess patient's knowledge and expectations regarding hyperbaric medicine and provide education related to the hyperbaric environment, goals of treatment and prevention of adverse events Implement protocols to decrease risk of pneumothorax in high risk patients Notes: Abuse / Safety / Falls / Self Care Management Nursing Diagnoses: Potential for falls Goals: Patient will remain injury free Date Initiated: 09/19/2015 Goal Status: Active Patient/caregiver will verbalize/demonstrate measures taken to prevent injury and/or falls Date Initiated: 09/19/2015 Goal Status: Active Interventions: Assess fall risk on admission and as needed Assess impairment of mobility on admission and as needed per policy Notes: Orientation to the Wound Care Program Nursing Diagnoses: [name] EvesBLACKBURN, Riniyah N. ([id number]) Knowledge deficit related to the wound healing center program Goals: Patient/caregiver will verbalize understanding of the Wound Healing Center Program Date Initiated: 08/30/2015 Goal Status: Active Interventions: Provide education on orientation to the wound  center Notes: Pressure Nursing Diagnoses: Knowledge deficit related to causes and  risk factors for pressure ulcer development Knowledge deficit related to management of pressures ulcers Potential for impaired tissue integrity related to pressure, friction, moisture, and shear Goals: Patient will remain free from development of additional pressure ulcers Date Initiated: 08/30/2015 Goal Status: Active Patient will remain free of pressure ulcers Date Initiated: 08/30/2015 Goal Status: Active Patient/caregiver will verbalize risk factors for pressure ulcer development Date Initiated: 08/30/2015 Goal Status: Active Patient/caregiver will verbalize understanding of pressure ulcer management Date Initiated: 08/30/2015 Goal Status: Active Interventions: Assess: immobility, friction, shearing, incontinence upon admission and as needed Assess offloading mechanisms upon admission and as needed Assess potential for pressure ulcer upon admission and as needed Provide education on pressure ulcers Treatment Activities: Patient referred for home evaluation of offloading devices/mattresses : 01/15/2016 Patient referred for pressure reduction/relief devices : 01/15/2016 Pressure reduction/relief device ordered : 01/15/2016 Notes: [name], [name] ([id number]) Wound/Skin Impairment Nursing Diagnoses: Impaired tissue integrity Knowledge deficit related to ulceration/compromised skin integrity Goals: Patient will have a decrease in wound volume by X% from date: (specify in notes) Date Initiated: 08/30/2015 Goal Status: Active Patient/caregiver will verbalize understanding of skin care regimen Date Initiated: 08/30/2015 Goal Status: Active Ulcer/skin breakdown will have a volume reduction of 30% by week 4 Date Initiated: 08/30/2015 Goal Status: Active Ulcer/skin breakdown will have a volume reduction of 50% by week 8 Date Initiated: 08/30/2015 Goal Status: Active Ulcer/skin breakdown will have a  volume reduction of 80% by week 12 Date Initiated: 08/30/2015 Goal Status: Active Ulcer/skin breakdown will heal within 14 weeks Date Initiated: 08/30/2015 Goal Status: Active Interventions: Assess patient/caregiver ability to obtain necessary supplies Assess patient/caregiver ability to perform ulcer/skin care regimen upon admission and as needed Assess ulceration(s) every visit Provide education on ulcer and skin care Notes: Electronic Signature(s) Signed: 06/10/2016 4:41:27 PM By: Elliot Gurney, RN, [name], Kim RN, [name] Entered By: Elliot Gurney, RN, [name], Kim on 06/10/2016 16:28:11 [name] ([id number]) -------------------------------------------------------------------------------- Pain Assessment Details [name] Date of Service: 06/10/2016 3:45 PM Patient Name: N. Patient Account Number: [id number] Medical Record Treating RN: Huel Coventry [id number] Number: Other Clinician: May 16, 1981 (34 y.o. Treating [name], [name] Date of Birth/Sex: Female) Physician/Extender: G Primary Care [name] Physician: Referring Physician: [name] in Treatment: 40 Active Problems Location of Pain Severity and Description of Pain Patient Has Paino No Site Locations With Dressing Change: No Pain Management and Medication Current Pain Management: Electronic Signature(s) Signed: 06/10/2016 4:41:27 PM By: Elliot Gurney, RN, [name], Kim RN, [name] Entered By: Elliot Gurney, RN, [name], Kim on 06/10/2016 16:12:43 [name] ([id number]) -------------------------------------------------------------------------------- Patient/Caregiver Education Details [name] Date of Service: 06/10/2016 3:45 PM Patient Name: N. Patient Account Number: [id number] Medical Record Treating RN: [name] [id number] Number: Other Clinician: Jan 06, 1981 (34 y.o. Treating [name], [name] Date of Birth/Gender: Female) Physician/Extender: G Primary Care Weeks in Treatment: 80 [name] Physician: Referring Physician: [name] Education Assessment Education Provided To: Patient and Caregiver Education Topics Provided Wound/Skin Impairment: Handouts: Other: reportable s/s Methods: Explain/Verbal Responses: State content correctly Electronic Signature(s) Signed: 06/10/2016 5:51:34 PM By: [name] Entered By: [name] on 06/10/2016 17:39:00 [name] ([id number]) -------------------------------------------------------------------------------- Wound Assessment Details [name] Date of Service: 06/10/2016 3:45 PM Patient Name: N. Patient Account Number: [id number] Medical Record Treating RN: Huel Coventry [id number] Number: Other Clinician: May 18, 1981 (34 y.o. Treating [name], [name] Date of Birth/Sex: Female) Physician/Extender: G Primary Care [name] Physician: Referring Physician: [name] in Treatment: 40 Wound Status Wound Number: 16 Primary Pressure Ulcer Etiology: Wound Location: Right Amputation Site -  Below Knee - Posterior Wound Open Status: Wounding Event: Pressure Injury Comorbid Anemia, Type I Diabetes, End Stage Date Acquired: 04/02/2016 History: Renal Disease, Rheumatoid Arthritis, Weeks Of Treatment: 9 Neuropathy Clustered Wound: No Photos Photo Uploaded By: Elliot Gurney, RN, [name], Kim on 06/10/2016 16:35:01 Wound Measurements Length: (cm) 3.5 Width: (cm) 2.4 Depth: (cm) 0.4 Area: (cm) 6.597 Volume: (cm) 2.639 % Reduction in Area: -11.1% % Reduction in Volume: -344.3% Epithelialization: None Tunneling: No Undermining: No Wound Description Classification: Category/Stage III Diabetic Severity (Wagner): Grade 1 Wound Margin: Distinct, outline attach Exudate Amount: Large Exudate Type: Serosanguineous Exudate Color: red, brown [name], [name] ([id number]) Foul Odor After Cleansing: No ed Wound Bed Granulation Amount: Small (1-33%) Exposed Structure Granulation Quality:  Red, Pink Fat Layer Exposed: Yes Necrotic Amount: Large (67-100%) Necrotic Quality: Eschar, Adherent Slough Periwound Skin Texture Texture Color No Abnormalities Noted: No No Abnormalities Noted: No Callus: No Atrophie Blanche: No Crepitus: No Cyanosis: No Excoriation: No Ecchymosis: No Fluctuance: No Erythema: Yes Friable: No Erythema Location: Circumferential Induration: No Erythema Change: No Change Localized Edema: Yes Hemosiderin Staining: No Rash: No Mottled: No Scarring: No Pallor: No Rubor: No Moisture No Abnormalities Noted: No Temperature / Pain Dry / Scaly: No Temperature: No Abnormality Maceration: No Tenderness on Palpation: Yes Moist: No Wound Preparation Ulcer Cleansing: Rinsed/Irrigated with Saline Topical Anesthetic Applied: Other: lidocaine 4%, Treatment Notes 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 Cleansed with: Clean wound with Normal Saline 2. Anesthetic Topical Lidocaine 4% cream to wound bed prior to debridement 4. Dressing Applied: Santyl Ointment 5. Secondary Dressing Applied Bordered Foam Dressing Dry Gauze Electronic Signature(s) Signed: 06/10/2016 4:41:27 PM By: Elliot Gurney, RN, [name], Kim RN, [name] Entered By: Elliot Gurney, RN, [name], Kim on 06/10/2016 16:22:16 [name] ([id number]) -------------------------------------------------------------------------------- Wound Assessment Details [name] Date of Service: 06/10/2016 3:45 PM Patient Name: N. Patient Account Number: [id number] Medical Record Treating RN: Huel Coventry [id number] Number: Other Clinician: January 24, 1981 (34 y.o. Treating [name], [name] Date of Birth/Sex: Female) Physician/Extender: G Primary Care [name] Physician: Referring Physician: [name] in Treatment: 40 Wound Status Wound Number: 17 Primary Open Surgical Wound Etiology: Wound Location: Left Amputation Site - Transmetatarsal Wound Open Status: Wounding  Event: Surgical Injury Comorbid Anemia, Type I Diabetes, End Stage Date Acquired: 04/13/2016 History: Renal Disease, Rheumatoid Arthritis, Weeks Of Treatment: 6 Neuropathy Clustered Wound: No Photos Photo Uploaded By: Elliot Gurney, RN, [name], Kim on 06/10/2016 16:35:03 Wound Measurements Length: (cm) 1.7 Width: (cm) 11.5 Depth: (cm) 1 Area: (cm) 15.355 Volume: (cm) 15.355 % Reduction in Area: -1728% % Reduction in Volume: -18179.8% Epithelialization: None Wound Description Classification: Partial Thickness Foul Odor Aft Diabetic Severity [address]): Grade 1 Wound Margin: Flat and Intact Exudate Amount: Medium Exudate Type: Serous Exudate Color: amber [name], [name] ([id number]) er Cleansing: No Wound Bed Granulation Amount: Large (67-100%) Exposed Structure Granulation Quality: Red Fascia Exposed: No Necrotic Amount: Small (1-33%) Fat Layer Exposed: No Necrotic Quality: Adherent Slough Tendon Exposed: No Muscle Exposed: No Joint Exposed: No Bone Exposed: No Limited to Skin Breakdown Periwound Skin Texture Texture Color No Abnormalities Noted: No No Abnormalities Noted: No Callus: No Atrophie Blanche: No Crepitus: No Cyanosis: No Excoriation: No Ecchymosis: No Fluctuance: No Erythema: No Friable: No Hemosiderin Staining: No Induration: No Mottled: No Localized Edema: No Pallor: No Rash: No Rubor: No Scarring: No Moisture No Abnormalities Noted: No Dry / Scaly: No Maceration: No Moist: Yes Wound Preparation Ulcer Cleansing: Rinsed/Irrigated with Saline Topical Anesthetic Applied: Other: lidocaine 4%, Treatment Notes Wound #  17 (Left Amputation Site - Transmetatarsal) 1. Cleansed with: Clean wound with Normal Saline 2. Anesthetic Topical Lidocaine 4% cream to wound bed prior to debridement 4. Dressing Applied: Aquacel Ag 5. Secondary Dressing Applied Guaze, ABD and kerlix/Conform Electronic Signature(s) Signed: 06/10/2016 4:41:27 PM By: Elliot Gurney,  RN, [name], Kim RN, [name] Entered By: Elliot Gurney, RN, [name], Kim on 06/10/2016 16:23:48 [name] ([id number]) Simeon Craft, [name] ([id number]) -------------------------------------------------------------------------------- Wound Assessment Details [name] Date of Service: 06/10/2016 3:45 PM Patient Name: N. Patient Account Number: [id number] Medical Record Treating RN: Huel Coventry [id number] Number: Other Clinician: [birth year] (34 y.o. Treating [name], [name] Date of Birth/Sex: Female) Physician/Extender: G Primary Care [name] Physician: Referring Physician: [name] in Treatment: 40 Wound Status Wound Number: 18 Primary Pressure Ulcer Etiology: Wound Location: Left Ischial Tuberosity Wound Open Wounding Event: Pressure Injury Status: Date Acquired: 05/12/2016 Comorbid Anemia, Type I Diabetes, End Stage Weeks Of Treatment: 1 History: Renal Disease, Rheumatoid Arthritis, Clustered Wound: No Neuropathy Photos Wound Measurements Length: (cm) 5 Width: (cm) 3.6 Depth: (cm) 0.1 Area: (cm) 14.137 Volume: (cm) 1.414 % Reduction in Area: -5.3% % Reduction in Volume: -5.3% Epithelialization: Small (1-33%) Tunneling: No Undermining: No Wound Description Classification: Category/Stage II Wound Margin: Flat and Intact Exudate Amount: Large Exudate Type: Serous Exudate Color: amber Wound Bed Granulation Amount: Medium (34-66%) Exposed Structure [name], [name] ([id number]) Necrotic Amount: Medium (34-66%) Fascia Exposed: No Necrotic Quality: Adherent Slough Fat Layer Exposed: No Tendon Exposed: No Muscle Exposed: No Joint Exposed: No Bone Exposed: No Limited to Skin Breakdown Periwound Skin Texture Texture Color No Abnormalities Noted: No No Abnormalities Noted: No Callus: No Atrophie Blanche: No Crepitus: No Cyanosis: No Excoriation: No Ecchymosis: No Fluctuance: No Erythema: Yes Friable: No Erythema Location:  Circumferential Induration: No Hemosiderin Staining: No Localized Edema: No Mottled: No Rash: No Pallor: No Scarring: No Rubor: No Moisture No Abnormalities Noted: No Dry / Scaly: No Maceration: No Moist: Yes Wound Preparation Ulcer Cleansing: Rinsed/Irrigated with Saline Topical Anesthetic Applied: Other: lidocaine 4%, Treatment Notes Wound #18 (Left Ischial Tuberosity) 1. Cleansed with: Clean wound with Normal Saline 2. Anesthetic Topical Lidocaine 4% cream to wound bed prior to debridement 4. Dressing Applied: Santyl Ointment 5. Secondary Dressing Applied Bordered Foam Dressing Dry Gauze Electronic Signature(s) Signed: 06/10/2016 4:41:27 PM By: Elliot Gurney, RN, [name], Kim RN, [name] Entered By: Elliot Gurney, RN, [name], Kim on 06/10/2016 16:40:32 [name] ([id number]) [name], [name] ([id number]) -------------------------------------------------------------------------------- Wound Assessment Details [name] Date of Service: 06/10/2016 3:45 PM Patient Name: N. Patient Account Number: [id number] Medical Record Treating RN: Huel Coventry [id number] Number: Other Clinician: 1981/04/14 (34 y.o. Treating [name], [name] Date of Birth/Sex: Female) Physician/Extender: G Primary Care [name] Physician: Referring Physician: [name] in Treatment: 40 Wound Status Wound Number: 9 Primary Diabetic Wound/Ulcer of the Lower Etiology: Extremity Wound Location: Left Calcaneous Wound Open Wounding Event: Pressure Injury Status: Date Acquired: 07/28/2015 Comorbid Anemia, Type I Diabetes, End Stage Weeks Of Treatment: 40 History: Renal Disease, Rheumatoid Arthritis, Clustered Wound: No Neuropathy Photos Photo Uploaded By: Elliot Gurney, RN, [name], Kim on 06/10/2016 16:38:56 Wound Measurements Length: (cm) 4.5 Width: (cm) 3.7 Depth: (cm) 0.1 Area: (cm) 13.077 Volume: (cm) 1.308 % Reduction in Area: 29.6% % Reduction in Volume:  29.6% Epithelialization: None Wound Description Classification: Grade 2 Foul Odor After Wound Margin: Flat and Intact Exudate Amount: None Present Cleansing: No Wound Bed Granulation Amount: Medium (34-66%) Exposed Structure Granulation Quality: Red Fascia Exposed: No Dunker, [place] N. ([id number]) Necrotic Amount: Medium (34-66%) Fat  Layer Exposed: No Necrotic Quality: Eschar, Adherent Slough Tendon Exposed: No Muscle Exposed: No Joint Exposed: No Bone Exposed: No Limited to Skin Breakdown Periwound Skin Texture Texture Color No Abnormalities Noted: No No Abnormalities Noted: No Callus: No Atrophie Blanche: No Crepitus: No Cyanosis: No Excoriation: No Ecchymosis: No Fluctuance: No Erythema: No Friable: No Hemosiderin Staining: No Induration: No Mottled: No Localized Edema: No Pallor: No Rash: No Rubor: No Scarring: No Moisture No Abnormalities Noted: No Dry / Scaly: No Maceration: No Moist: Yes Wound Preparation Ulcer Cleansing: Rinsed/Irrigated with Saline Topical Anesthetic Applied: None Treatment Notes Wound #9 (Left Calcaneous) 1. Cleansed with: Clean wound with Normal Saline 2. Anesthetic Topical Lidocaine 4% cream to wound bed prior to debridement 4. Dressing Applied: Aquacel Ag 5. Secondary Dressing Applied Guaze, ABD and kerlix/Conform Electronic Signature(s) Signed: 06/10/2016 4:41:27 PM By: Elliot Gurney, RN, [name], Kim RN, [name] Entered By: Elliot Gurney, RN, [name], Kim on 06/10/2016 16:25:19 [name] ([id number]) -------------------------------------------------------------------------------- Vitals Details [name] Date of Service: 06/10/2016 3:45 PM Patient Name: N. Patient Account Number: [id number] Medical Record Treating RN: Huel Coventry [id number] Number: Other Clinician: 1981-07-12 (34 y.o. Treating [name], [name] Date of Birth/Sex: Female) Physician/Extender: G Primary Care [name] Physician: Referring Physician:  [name] in Treatment: 40 Vital Signs Time Taken: 16:15 Temperature (F): 98.0 Height (in): 68 Pulse (bpm): 71 Weight (lbs): 96 Respiratory Rate (breaths/min): 16 Body Mass Index (BMI): 14.6 Blood Pressure (mmHg): 132/92 Reference Range: 80 - 120 mg / dl Electronic Signature(s) Signed: 06/10/2016 4:41:27 PM By: Elliot Gurney, RN, [name], Kim RN, [name] Entered By: Elliot Gurney, RN, [name], Kim on 06/10/2016 16:15:31

## 2016-06-14 LAB — AEROBIC CULTURE W GRAM STAIN (SUPERFICIAL SPECIMEN): Gram Stain: NONE SEEN

## 2016-06-14 LAB — AEROBIC CULTURE  (SUPERFICIAL SPECIMEN)

## 2016-06-17 ENCOUNTER — Encounter: Payer: Medicare Other | Admitting: Internal Medicine

## 2016-06-17 DIAGNOSIS — E10621 Type 1 diabetes mellitus with foot ulcer: Secondary | ICD-10-CM | POA: Diagnosis not present

## 2016-06-18 NOTE — Progress Notes (Signed)
[name] ([id number]) Visit Report for 06/17/2016 Chief Complaint Document Details [name] Date of Service: 06/17/2016 10:00 AM Patient Name: N. Patient Account Number: [id number] Medical Record Treating RN: [name] [id number] Number: Other Clinician: July 11, Wu (34 y.o. [name] Date of Birth/Sex: Female) Physician/Extender: G Primary Care [name] Physician: Referring Physician: [name] Wu in Treatment: 67 Information Obtained from: Patient Chief Complaint Patient in today for treatment of non-healing wound and HBO Treatment. she has just gotten out of hospital this week and is back to resume her hyperbaric oxygen therapy Electronic Signature(s) Signed: 06/17/2016 4:24:47 PM By: [name] Najjar Wu Entered By: [name] on 06/17/2016 12:49:13 [name] ([id number]) -------------------------------------------------------------------------------- Debridement Details [name] Date of Service: 06/17/2016 10:00 AM Patient Name: N. Patient Account Number: [id number] Medical Record Treating RN: [name] [id number] Number: Other Clinician: 1981/05/14 (34 y.o. [name] Date of Birth/Sex: Female) Physician/Extender: G Primary Care [name] Physician: Referring Physician: [name] Wu in Treatment: 41 Debridement Performed for Wound #18 Left Ischial Tuberosity Assessment: Performed By: Physician [name] Debridement: Debridement Pre-procedure Yes - 10:50 Verification/Time Out Taken: Start Time: 10:51 Pain Control: Other : lidocaine 4% Level: Skin/Subcutaneous Tissue Total Area Debrided (L x 1 (cm) x 1.5 (cm) = 1.5 (cm) W): Tissue and other Viable, Non-Viable, Eschar, Exudate, Fibrin/Slough, Subcutaneous material debrided: Instrument: Curette Bleeding: Moderate Hemostasis Achieved: Pressure End Time: 10:54 Procedural Pain: 0 Post Procedural Pain:  0 Response to Treatment: Procedure was tolerated well Post Debridement Measurements of Total Wound Length: (cm) 1 Stage: Category/Stage II Width: (cm) 1.5 Depth: (cm) 1.5 Volume: (cm) 1.767 Character of Wound/Ulcer Post Requires Further Debridement: Debridement Severity of Tissue Post Necrosis of muscle Debridement: Post Procedure Diagnosis Same as Pre-procedure [name], Wu ([id number]) Electronic Signature(s) Signed: 06/17/2016 4:24:47 PM By: [name] Najjar Wu Signed: 06/17/2016 5:11:18 PM By: [name] Gurney RN, Wu, [name] Entered By: [name] on 06/17/2016 12:48:39 [name] ([id number]) -------------------------------------------------------------------------------- HPI Details [name] Date of Service: 06/17/2016 10:00 AM Patient Name: N. Patient Account Number: [id number] Medical Record Treating RN: [name] [id number] Number: Other Clinician: [birth year]-Wu (34 y.o. [name] Date of Birth/Sex: Female) Physician/Extender: G Primary Care [name] Physician: Referring Physician: [name] Wu in Treatment: 77 History of Present Illness Location: dry gangrene both feet and heels Quality: Patient reports No Pain. Severity: Patient states wound are getting worse. Duration: Patient has had the wound for > 38months prior to seeking treatment at the wound center Context: The wound appeared gradually over time Modifying Factors: she has been in and out of hospital over the last 2 months Associated Signs and Symptoms: Patient reports having difficulty standing for long periods. HPI Description: [name] is a 35 y.o. female who presents to our wound center, back in June 2016, referred by her PCP [name] for nonhealing ulcers on the lateral aspect of the right heel. Of note she has a history of type 1 diabetes mellitus that has been uncontrolled. Past medical history significant for type 1 diabetes mellitus  not controlled, ankylosing spondylitis, anorexia nervosa, irritable bowel syndrome, chronic kidney disease, chronic diarrhea. she then developed gangrene of both feet due to severe peripheral vascular disease and also had gangrene of the tips of her fingers due to upper extremity vascular disease. She was being worked up by vascular surgery at [facility] and at [facility] and has had several procedures done there. She started with hyperbaric oxygen therapy and had a total of 40  treatments the last one being on 06/20/2015. After the initial treatment of hyperbaric oxygen therapy she started having ear problems and had ultimately to use myringotomy tubes and this was done bilaterally. Since then her ears have been doing fine. In late September, she had seen vascular and hand surgeons. since then she's been in [place] at the rec center for surgery involving extensive vascular procedures for the upper extremities. She was then at [facility] with GI bleeds both upper and lower and has been in and out of hospital for that. She has recently been out of hospital for the last week. 09/09/2015 -- she was unable to get here in time to start her hyperbaric oxygen today and hence is only here for a wound care visit. 09/19/2015 -- she has been having vancomycin during her dialysis and continues to have vascular appointments and the procedure is been set for early January. She has been unable to make it for her hemodialysis due to various medical symptoms. 09/30/2014 -- her vancomycin was stopped on 09/25/2015 and the mother has noticed the right foot has started draining for the last 3 days. Addendum: after examining the patient I was able to talk to her primary vascular surgeon [name] at the [facility]. I told him about the necrotic area on the plantar aspect of right foot which is now wet gangrene and he agreed with me that he would admit her at [facility] under her care and synchronize further treatment.  We have discussed her poor prognosis and he and I discussed the need for hospice care [name] ([id number]) and for sitting down and talking to the patient and her mother and giving them a proper detail of the prognosis. 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 She was worked up with MRIs, abdomen aortogram and placement of left-sided angioplasties were done. After a prolonged hospital she she was discharged home and was told to wear shrinker sock and stump protector and see her surgeon for further instructions regarding wound care and suture removal. Asked to take long-term doxycycline. 01/15/16; this is a patient I haven't seen before although she is been followed by Dr. Meyer Russel in this clinic today. She is a type I diabetic with severe PAD macrovascular disease. She has had a previous BKA. She has dry gangrene of the tips of her fingers which she showed me on the right to. She also has dry gangrene of the left first second and third toes and a portion of her proximal foot around these areas. She is followed by vascular surgery at [facility] and saw them recently they are not going to do surgery as of yet. She has a large black eschar over her heel which is beginning to separate in some areas. As I understand think she is paining these with Betadine. There is been some suggestion about retrying hyperbarics on her although she is still not able to commit to the frequency of treatment that would be necessary to see improvement. She is also on Monday Wednesday Friday dialysis 01/21/16; the patient  returns to see me today with regards to the left heel. Apparently she is not  scheduled for any further attempts at revascularization of the left foot. 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 01/28/16; the patient returns today for attention to the left foot specifically the left calcaneal ulcer. This is covered in a thick black eschar. I crosshatched this last week and we have been using Santyl. 02/05/16; I continue to work on the thick black eschar on the patient's left calcaneus. She is using Santyl that this side crosshatched this area and the eschar is beginning to loosen. 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 This is beginning to separate and liquefy as well especially between the first and second toes. In the time being her major complaint is fatigue at dialysis 02/26/16 I continue to work on the patient's left calcaneus thick adherent black eschar. I crosshatched this area and we have been using Santyl although it is very adherent area I remove some nonviable tissue today what I can see of this actually looks surprisingly good. On the same foot she has dry gangrene on the first and second toes and part of the forefoot underneath this. This is beginning to separate. 03/11/16; the patient comes in for her every 2 week appointment. I have been working on the black eschar on her heel. The patient apparently again has to come off dialysis early today after 2 hours due to severe complaints of nausea. She really does not look well. 04/02/2016 -- the patient has not been seen here for about 3 weeks now and has a new issue with the stump of the right amputation site and also her right posterior thigh. They have only noticed this for the last couple of days. 04/28/16; I had received a call  from the patient's surgeon at [facility]. She had a left transmetatarsal amputation and Integra applied to the left heel. Both of these areas appear to be doing well. There are dressing these with [facility] and they will return to their surgeon on Thursday. She has a new injury on the popliteal fossa on the right which I think was trauma from her stump. 05/20/16; the patient is following with her surgeon at [facility]. She's had a left transmetatarsal debridement of the left heel she had Integra place and apparently is using some consternation of Epson salts soaks, Betadine and Aquacel Ag. The left leg is wrapped I did not look at this today. She has the wound in her right popliteal fossa which is apparently a pressure area possibly related to sitting on a toilet for 2-3 hours multiple times a day. 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 We have been using Santyl to the area on the right popliteal fossa. She has Wohlfarth, Kirstine N. ([id number]) a new wound on her right gluteal area but the patient would not let me look at that today 06/03/16; patient is not doing particularly well. She now has a pressure area over her left ischial tuberosity. This is of quite a size and covered with an adherent surface slough. She looks as though she has lost weight, I didn't want to go ahead and attempt to debridement this today. There is really no evidence of infection. The area behind the right popliteal fossa looks about the same as 2 weeks ago we  have been using Santyl to this area. I did not look at her left foot which is wrapped been followed by podiatry at [facility] 06/10/16; patient arrives in clinic actually looking a lot brighter than I usually see her, predictably she did not go to dialysis today. For the first time in perhaps 6 weeks I actually saw her left foot today. The transmetatarsal site is healed. The left heel has a reasonably  stable-looking wound which has a clean base. Some eschar superiorly and a few sutures remain in place. More worrisome is an area on her lateral left foot over the metatarsal head. Quarter size necrotic wound that probes to bone. There is some purulence here which I cultured there is nothing that looks like a healthy base of this area. They've been using silver alginate to this area at home. She also has a large wound in the right popliteal fossa, and again a pressure area over the left ischial tuberosity. We are using Santyl to both of these areas. Both allays looks somewhat better than last week, using Santyl to both areas 06/17/16: culture from last week grew citrobacter and amp sensitive enterococcus fecalis. Started on vanc last Saturday at dialysis. have spoken to dialysis in [place] re adjustment in antibiotics added ceftazidine to vanc Electronic Signature(s) Signed: 06/17/2016 4:24:47 PM By: [name] Najjar Wu Entered By: [name] on 06/17/2016 12:52:40 [name] ([id number]) -------------------------------------------------------------------------------- Physical Exam Details [name] Date of Service: 06/17/2016 10:00 AM Patient Name: N. Patient Account Number: [id number] Medical Record Treating RN: [name] [id number] Number: Other Clinician: 22-Jan-Wu (34 y.o. [name] Date of Birth/Sex: Female) Physician/Extender: G Primary Care [name] Physician: Referring Physician: [name] Wu in Treatment: 79 Constitutional Sitting or standing Blood Pressure is within target range for patient.. Pulse regular and within target range for patient.Marland Kitchen Respirations regular, non-labored and within target range.. Temperature is normal and within the target range for the patient.. Patient's appearance is neat and clean. Appears in no acute distress. Well nourished and well developed.. Notes wound exam: pop fossa on the right looks more  healthy left ischial tuberosity has a necrotic center likely to be a stage 3. probing wound on the lateral foot just proximal to transmet site. probes superiorly. still purulent drainage but no erythema and no tenderness. Electronic Signature(s) Signed: 06/17/2016 4:24:47 PM By: [name] Najjar Wu Entered By: [name] on 06/17/2016 12:56:13 [name] ([id number]) -------------------------------------------------------------------------------- Physician Orders Details [name] Date of Service: 06/17/2016 10:00 AM Patient Name: N. Patient Account Number: [id number] Medical Record Treating RN: [name] [id number] Number: Other Clinician: [birth year] (34 y.o. [name] Date of Birth/Sex: Female) Physician/Extender: G Primary Care [name] Physician: Referring Physician: [name] Wu in Treatment: 23 Verbal / Phone Orders: Yes Clinician: Huel Wu Read Back and Verified: Yes Diagnosis Coding Wound Cleansing 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,Posterior Amputation Site - Below Knee o Clean wound with Normal Saline. o Cleanse wound with mild soap and water Wound #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 o Clean wound with Normal Saline. o Cleanse wound with mild soap and water Wound #18 Left Ischial Tuberosity o Clean wound with Normal Saline. 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,Posterior Amputation Site - Below Knee o Topical Lidocaine 4% cream applied to wound bed prior to debridement Wound #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 o Topical Lidocaine 4% cream applied to wound bed prior to debridement Wound #18 Left Ischial Tuberosity o Topical Lidocaine 4% cream applied  to wound bed prior to debridement Primary Wound Dressing Wound #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 o Santyl Ointment Wound #18 Left Ischial Tuberosity o Santyl Ointment 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,Posterior Amputation Site  - Below Knee o Aquacel Ag [name], [name] ([id number]) Secondary Dressing Wound #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 o Gauze and Kerlix/Conform o Boardered Foam Dressing 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,Posterior Amputation Site - Below Knee o Boardered Foam Dressing Wound #18 Left Ischial Tuberosity o Boardered Foam Dressing Dressing Change Frequency Wound #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 o Change dressing every day. Wound #18 Left Ischial Tuberosity o Change dressing every day. 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,Posterior Amputation Site - Below Knee o Change dressing every day. o Change dressing every other day. Follow-up Appointments 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,Posterior Amputation Site - Below Knee o Return Appointment in 2 weeks. o Other: - as patient is able Wound #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 o Return Appointment in 2 weeks. o Other: - as patient is able Wound #18 Left Ischial Tuberosity o Return Appointment in 2 weeks. o Other: - as patient is able Wound #9 Left Calcaneous o Return Appointment in 2 weeks. o Other: - as patient is able Medications-please add to medication list. 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,Posterior Amputation Site - Below Knee o Santyl Enzymatic Ointment Wound #18 Left Ischial Tuberosity o Santyl Enzymatic Ointment Electronic Signature(s) [name], Rubbie N. ([id number]) Signed: 06/17/2016 4:24:47 PM By: [name] Najjarobson, [name] Wu Signed: 06/17/2016 5:11:18 PM By: [name] GurneyWoody, RN, Wu, [name] Entered By: [name] GurneyWoody, RN, Wu, [name] on 06/17/2016 10:58:16 [name], Lilyanne N. ([id number]) -------------------------------------------------------------------------------- Problem List Details Shanon PayorBLACKBURN, Mirabelle Date of Service: 06/17/2016 10:00 AM Patient Name: N. Patient Account Number: [id number] Medical Record Treating RN: [name] CoventryWoody, [name] [id number] Number: Other Clinician: July 06, Wu (34 y.o. [name] Date of Birth/Sex: Female)  Physician/Extender: G Primary Care Rolin Barrylmedo, Mario Physician: Referring Physician: [name] Partridgelmedo, Mario Weeks in Treatment: 2541 Active Problems ICD-10 Encounter Code Description Active Date Diagnosis E10.621 Type 1 diabetes mellitus with foot ulcer 08/30/2015 Yes E10.52 Type 1 diabetes mellitus with diabetic peripheral 08/30/2015 Yes angiopathy with gangrene I70.245 Atherosclerosis of native arteries of left leg with ulceration 08/30/2015 Yes of other part of foot I70.261 Atherosclerosis of native arteries of extremities with 08/30/2015 Yes gangrene, right leg L89.622 Pressure ulcer of left heel, stage 2 08/30/2015 Yes Z89.511 Acquired absence of right leg below knee 11/29/2015 Yes T87.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, right lower extremity 04/02/2016 Yes S71.101A Unspecified open wound, right thigh, initial encounter 04/02/2016 Yes L89.322 Pressure ulcer of left buttock, stage 2 06/10/2016 Yes [name], Adison Wu Kitchen. ([id number]) Inactive Problems Resolved Problems ICD-10 Code Description Active Date Resolved Date L02.611 Cutaneous abscess of right foot 10/01/2015 10/01/2015 Electronic Signature(s) Signed: 06/17/2016 4:24:47 PM By: [name] Najjarobson, [name] Wu Entered By: [name] Najjarobson, Wu on 06/17/2016 12:48:25 [name], Jacquline N. ([id number]) -------------------------------------------------------------------------------- Progress Note Details Shanon PayorBLACKBURN, Laquashia Date of Service: 06/17/2016 10:00 AM Patient Name: N. Patient Account Number: [id number] Medical Record Treating RN: [name] CoventryWoody, [name] [id number] Number: Other Clinician: July 06, Wu (34 y.o. [name] Date of Birth/Sex: Female) Physician/Extender: G Primary Care Rolin Barrylmedo, Mario Physician: Referring Physician: [name] Partridgelmedo, Mario Weeks in Treatment: 6541 Subjective Chief Complaint Information obtained from Patient Patient in today for treatment of non-healing wound and HBO Treatment. she has just gotten out of hospital this week and is back to  resume her hyperbaric oxygen therapy History of Present Illness (HPI) The following HPI elements were documented for the patient's wound: Location: dry gangrene both feet and heels Quality: Patient reports No Pain. Severity: Patient states wound  are getting worse. Duration: Patient has had the wound for > 4months prior to seeking treatment at the wound center Context: The wound appeared gradually over time Modifying Factors: she has been in and out of hospital over the last 2 months Associated Signs and Symptoms: Patient reports having difficulty standing for long periods. [name] is a 35 y.o. female who presents to our wound center, back in June 2016, referred by her PCP [name] for nonhealing ulcers on the lateral aspect of the right heel. Of note she has a history of type 1 diabetes mellitus that has been uncontrolled. Past medical history significant for type 1 diabetes mellitus not controlled, ankylosing spondylitis, anorexia nervosa, irritable bowel syndrome, chronic kidney disease, chronic diarrhea. she then developed gangrene of both feet due to severe peripheral vascular disease and also had gangrene of the tips of her fingers due to upper extremity vascular disease. She was being worked up by vascular surgery at [facility] and at [facility] and has had several procedures done there. She started with hyperbaric oxygen therapy and had a total of 40 treatments the last one being on 06/20/2015. After the initial treatment of hyperbaric oxygen therapy she started having ear problems and had ultimately to use myringotomy tubes and this was done bilaterally. Since then her ears have been doing fine. In late September, she had seen vascular and hand surgeons. since then she's been in [place] at the rec center for surgery involving extensive vascular procedures for the upper extremities. She was then at [facility] with GI bleeds both upper and lower and has been in and out of hospital  for that. She has recently been out of hospital for the last week. 09/09/2015 -- she was unable to get here in time to start her hyperbaric oxygen today and hence is only here for a wound care visit. 09/19/2015 -- she has been having vancomycin during her dialysis and continues to have vascular Aponte, Kolby N. ([id number]) appointments and the procedure is been set for early January. She has been unable to make it for her hemodialysis due to various medical symptoms. 09/30/2014 -- her vancomycin was stopped on 09/25/2015 and the mother has noticed the right foot has started draining for the last 3 days. Addendum: after examining the patient I was able to talk to her primary vascular surgeon [name] at the [facility]. I told him about the necrotic area on the plantar aspect of right foot which is now wet gangrene and he agreed with me that he would admit her at [facility] under her care and synchronize further treatment. We have discussed her poor prognosis and he and I discussed the need for hospice care and for sitting down and talking to the patient and her mother and giving them a proper detail of the prognosis. 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 She was worked up with MRIs, abdomen aortogram and placement of left-sided angioplasties were done. After a prolonged hospital she she was discharged home and was told to wear shrinker sock and stump protector and see her surgeon for further instructions regarding wound care and suture removal. Asked to take  long-term  doxycycline. 01/15/16; this is a patient I haven't seen before although she is been followed by Dr. Meyer Russel in this clinic today. She is a type I diabetic with severe PAD macrovascular disease. She has had a previous BKA. She has dry gangrene of the tips of her fingers which she showed me on the right to. She also has dry gangrene of the left first second and third toes and a portion of her proximal foot around these areas. She is followed by vascular surgery at [facility] and saw them recently they are not going to do surgery as of yet. She has a large black eschar over her heel which is beginning to separate in some areas. As I understand think she is paining these with Betadine. There is been some suggestion about retrying hyperbarics on her although she is still not able to commit to the frequency of treatment that would be necessary to see improvement. She is also on Monday Wednesday Friday dialysis 01/21/16; the patient returns to see me today with regards to the left heel. Apparently she is not scheduled for any further attempts at revascularization of the left foot. 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 01/28/16; the patient returns today for attention to the left foot specifically the left calcaneal ulcer. This is covered in a thick black eschar. I crosshatched this last week and we have been using Santyl. 02/05/16; I continue to work on the thick black eschar on the patient's left calcaneus. She is using Santyl that this side crosshatched this area and the eschar is beginning to loosen. 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 This is beginning to separate and liquefy as well especially between the first and second toes. In the time being her major complaint is fatigue at  dialysis 02/26/16 I continue to work on the patient's left calcaneus thick adherent black eschar. I crosshatched this area and we have been using Santyl although it is very adherent area I remove some nonviable tissue today what I can see of this actually looks surprisingly good. On the same foot she has dry gangrene on the first and second toes and part of the forefoot underneath this. This is beginning to separate. 03/11/16; the patient comes in for her every 2 week appointment. I have been working on the black eschar on her heel. The patient apparently again has to come off dialysis early today after 2 hours due to severe complaints of nausea. She really does not look well. 04/02/2016 -- the patient has not been seen here for about 3 weeks now and has a new issue with the stump of the right amputation site and also her right posterior thigh. They have only noticed this for the last couple of days. 04/28/16; I had received a call from the patient's surgeon at [facility]. She had a left transmetatarsal amputation [name], Shayanne N. ([id number]) and Integra applied to the left heel. Both of these areas appear to be doing well. There are dressing these with [facility] and they will return to their surgeon on Thursday. She has a new injury on the popliteal fossa on the right which I think was trauma from her stump. 05/20/16; the patient is following with her surgeon at [facility]. She's had a left transmetatarsal debridement of  the left heel she had Integra place and apparently is using some consternation of Epson salts soaks, Betadine and Aquacel Ag. The left leg is wrapped I did not look at this today. She has the wound in her right popliteal fossa which is apparently a pressure area possibly related to sitting on a toilet for 2-3 hours multiple times a day. 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 We have been using Santyl to the  area on the right popliteal fossa. She has a new wound on her right gluteal area but the patient would not let me look at that today 06/03/16; patient is not doing particularly well. She now has a pressure area over her left ischial tuberosity. This is of quite a size and covered with an adherent surface slough. She looks as though she has lost weight, I didn't want to go ahead and attempt to debridement this today. There is really no evidence of infection. The area behind the right popliteal fossa looks about the same as 2 weeks ago we have been using Santyl to this area. I did not look at her left foot which is wrapped been followed by podiatry at [facility] 06/10/16; patient arrives in clinic actually looking a lot brighter than I usually see her, predictably she did not go to dialysis today. For the first time in perhaps 6 weeks I actually saw her left foot today. The transmetatarsal site is healed. The left heel has a reasonably stable-looking wound which has a clean base. Some eschar superiorly and a few sutures remain in place. More worrisome is an area on her lateral left foot over the metatarsal head. Quarter size necrotic wound that probes to bone. There is some purulence here which I cultured there is nothing that looks like a healthy base of this area. They've been using silver alginate to this area at home. She also has a large wound in the right popliteal fossa, and again a pressure area over the left ischial tuberosity. We are using Santyl to both of these areas. Both allays looks somewhat better than last week, using Santyl to both areas 06/17/16: culture from last week grew citrobacter and amp sensitive enterococcus fecalis. Started on vanc last Saturday at dialysis. have spoken to dialysis in [place] re adjustment in antibiotics added ceftazidine to vanc Objective Constitutional Sitting or standing Blood Pressure is within target range for patient.. Pulse regular and within target  range for patient.Marland Kitchen Respirations regular, non-labored and within target range.. Temperature is normal and within the target range for the patient.. Patient's appearance is neat and clean. Appears in no acute distress. Well nourished and well developed.. Vitals Time Taken: 10:30 AM, Height: 68 in, Weight: 96 lbs, BMI: 14.6, Temperature: 97.7 F, Pulse: 81 bpm, Respiratory Rate: 16 breaths/min, Blood Pressure: 143/92 mmHg. General Notes: wound exam: pop fossa on the right looks more healthy left ischial tuberosity has a necrotic center likely to be a stage 3. probing wound on the lateral foot just proximal to transmet site. probes superiorly. still purulent drainage but no erythema and no tenderness. [name], Wu ([id number]) Integumentary (Hair, Skin) Wound #16 status is Open. Original cause of wound was Pressure Injury. 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 The wound measures 3.2cm length x 2.5cm width x 0.4cm depth; 6.283cm^2 area and 2.513cm^3 volume. There is fat exposed. There is no tunneling or undermining noted. There is a large amount of  serosanguineous drainage noted. The wound margin is distinct with the outline attached to the wound base. There is small (1-33%) red, pink granulation within the wound bed. There is a large (67-100%) amount of necrotic tissue within the wound bed including Eschar and Adherent Slough. The periwound skin appearance exhibited: Localized Edema, Erythema. The periwound skin appearance did not exhibit: Callus, Crepitus, Excoriation, Fluctuance, Friable, Induration, Rash, Scarring, Dry/Scaly, Maceration, Moist, Atrophie Blanche, Cyanosis, Ecchymosis, Hemosiderin Staining, Mottled, Pallor, Rubor. The surrounding wound skin color is noted with erythema which is circumferential. Periwound temperature was noted as No Abnormality. The periwound has tenderness on palpation. Wound #17 status is Open. Original cause of wound  was Surgical Injury. The wound is located on the Left Amputation Site - Transmetatarsal. The wound measures 1.7cm length x 10cm width x 1cm depth; 13.352cm^2 area and 13.352cm^3 volume. The wound is limited to skin breakdown. There is no tunneling noted, however, there is undermining starting at 6:00 and ending at 9:00 with a maximum distance of 0.8cm. There is a medium amount of serous drainage noted. The wound margin is flat and intact. There is large (67-100%) red granulation within the wound bed. There is a small (1-33%) amount of necrotic tissue within the wound bed including Adherent Slough. The periwound skin appearance exhibited: Moist. The periwound skin appearance did not exhibit: Callus, Crepitus, Excoriation, Fluctuance, Friable, Induration, Localized Edema, Rash, Scarring, Dry/Scaly, Maceration, Atrophie Blanche, Cyanosis, Ecchymosis, Hemosiderin Staining, Mottled, Pallor, Rubor, Erythema. Wound #18 status is Open. Original cause of wound was Pressure Injury. The wound is located on the Left Ischial Tuberosity. The wound measures 4.5cm length x 3.3cm width x 0.1cm depth; 11.663cm^2 area and 1.166cm^3 volume. The wound is limited to skin breakdown. There is no tunneling or undermining noted. There is a large amount of serous drainage noted. The wound margin is flat and intact. There is medium (34-66%) granulation within the wound bed. There is a medium (34-66%) amount of necrotic tissue within the wound bed including Eschar and Adherent Slough. The periwound skin appearance exhibited: Moist, Erythema. The periwound skin appearance did not exhibit: Callus, Crepitus, Excoriation, Fluctuance, Friable, Induration, Localized Edema, Rash, Scarring, Dry/Scaly, Maceration, Atrophie Blanche, Cyanosis, Ecchymosis, Hemosiderin Staining, Mottled, Pallor, Rubor. The surrounding wound skin color is noted with erythema which is circumferential. Wound #9 status is Open. Original cause of wound was  Pressure Injury. The wound is located on the Left Calcaneous. The wound is limited to skin breakdown. There is a none present amount of drainage noted. The wound margin is flat and intact. There is medium (34-66%) red granulation within the wound bed. There is a medium (34-66%) amount of necrotic tissue within the wound bed including Eschar and Adherent Slough. The periwound skin appearance exhibited: Moist. The periwound skin appearance did not exhibit: Callus, Crepitus, Excoriation, Fluctuance, Friable, Induration, Localized Edema, Rash, Scarring, Dry/Scaly, Maceration, Atrophie Blanche, Cyanosis, Ecchymosis, Hemosiderin Staining, Mottled, Pallor, Rubor, Erythema. General Notes: Graft applied by podiatrist last week. Did not access wound. 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 There was a Skin/Subcutaneous Tissue Debridement ([id number]) debridement with total area of 1.5 sq cm performed by [name] with the following instrument(s): Curette to remove Viable and Non-Viable tissue/material including Exudate, Fibrin/Slough, Eschar, and Subcutaneous after achieving pain control using Other (lidocaine 4%). A time out was conducted at 10:50, prior to the start of the procedure. A  Moderate amount of bleeding was controlled with Pressure. The procedure was tolerated well with a pain level of 0 throughout and a pain level of 0 following the procedure. Post Debridement Measurements: 1cm length x 1.5cm width x 1.5cm depth; 1.767cm^3 volume. Post debridement Stage noted as Category/Stage II. Character of Wound/Ulcer Post Debridement requires further debridement. Severity of Tissue Post Debridement is: Necrosis of muscle. Post procedure Diagnosis Wound #18: Same as Pre-Procedure Plan Wound Cleansing: 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 Cleanse wound with mild soap and water Wound #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: Clean wound with Normal Saline. Cleanse wound with mild soap and water [name], Amiria N. ([id number]) Wound #18 Left Ischial Tuberosity: Clean wound with Normal Saline. 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,Posterior Amputation Site - Below Knee: Topical Lidocaine 4% cream applied to wound bed prior to debridement Wound #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: Topical Lidocaine 4% cream applied to wound bed prior to debridement Wound #18 Left Ischial Tuberosity: Topical Lidocaine 4% cream applied to wound bed prior to debridement Primary Wound Dressing: Wound #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: Santyl Ointment Wound #18 Left Ischial Tuberosity: Santyl Ointment 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,Posterior Amputation Site - Below Knee: Aquacel Ag Secondary Dressing: Wound #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: Gauze and Kerlix/Conform Boardered Foam Dressing 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,Posterior Amputation Site - Below Knee: Boardered Foam Dressing Wound #18 Left Ischial Tuberosity: Boardered Foam Dressing Dressing Change Frequency: Wound #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: Change dressing every day. Wound #18 Left Ischial Tuberosity: Change dressing every  day. 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 Change dressing every other day. 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 Other: - as patient is able Wound #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: Return Appointment in 2 weeks. Other: - as patient is able Wound #18 Left Ischial Tuberosity: Return Appointment in 2 weeks. Other: - as patient is able Wound #9 Left Calcaneous: Return Appointment in 2 weeks. Other: - as patient is able Medications-please add to medication list.: 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,Posterior Amputation Site - Below Knee: Santyl Enzymatic Ointment Wound #18 Left Ischial Tuberosity: Santyl Enzymatic Ointment [name], Corin N. ([id number]) silver alginate to foot continue santyl to pop fossa on the right, left ishial tuberosity added ceftazidine to vanc at dialysis I will call Dr. Dawna Part at podiatry at [facility] who was his foot surgeon Electronic Signature(s) Signed: 06/17/2016 4:24:47 PM By: [name] Najjar Wu Entered By: [name] on 06/17/2016 12:58:07 [name] ([id number]) -------------------------------------------------------------------------------- SuperBill Details [name] Date of Service: 06/17/2016 Patient Name: N. Patient Account Number: [id number] Medical Record Treating RN: [name] [id number] Number: Other Clinician: 13-Apr-Wu (34 y.o. 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 I70.261 Atherosclerosis of native  arteries of extremities with gangrene, right leg L89.622 Pressure ulcer of left heel, stage 2 Z89.511 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, right lower extremity S71.101A Unspecified open wound, right thigh, initial encounter L89.322 Pressure ulcer of left buttock, stage 2 Facility Procedures CPT4 Code: [id number] Description: 11042 - DEB SUBQ TISSUE 20 SQ CM/< ICD-10 Description Diagnosis L89.322 Pressure ulcer of left buttock, stage 2 Modifier: Quantity: 1 Physician Procedures CPT4 Code: [id number] Description: 11042 - WC PHYS SUBQ TISS 20 SQ CM ICD-10 Description Diagnosis L89.322 Pressure ulcer of left buttock, stage 2 Modifier: Quantity: 1 Electronic Signature(s) Signed: 06/17/2016 4:24:47 PM By: [name] Najjar Wu Entered By: [name] on 06/17/2016 12:58:44

## 2016-06-18 NOTE — Progress Notes (Signed)
GUILA, HOMOLA ([id number]) Visit Report for 06/17/2016 Arrival Information Details AMELLIA, BAMBERGER Date of Service: 06/17/2016 10:00 AM Patient Name: N. Patient Account Number: [id number] Medical Record Treating RN: Huel Coventry [id number] Number: Other Clinician: 30-May-1981 (34 y.o. Treating [name], [name] Date of Birth/Sex: Female) Physician/Extender: G Primary Care [name] Physician: Referring Physician: [name] in Treatment: 37 Visit Information History Since Last Visit Added or deleted any medications: No Patient Arrived: Wheel Chair Any new allergies or adverse reactions: No Arrival Time: 10:29 Had a fall or experienced change in No activities of daily living that may affect Accompanied By: mom risk of falls: Transfer Assistance: None Signs or symptoms of abuse/neglect since last No Patient Identification Verified: Yes visito Secondary Verification Process Yes Hospitalized since last visit: No Completed: Has Dressing in Place as Prescribed: Yes Patient Requires Transmission-Based No Pain Present Now: No Precautions: Patient Has Alerts: Yes Electronic Signature(s) Signed: 06/17/2016 5:11:18 PM By: Elliot Gurney, RN, [name], Kim RN, [name] Entered By: Elliot Gurney, RN, [name], Kim on 06/17/2016 10:29:56 [name] ([id number]) -------------------------------------------------------------------------------- Encounter Discharge Information Details [name] Date of Service: 06/17/2016 10:00 AM Patient Name: N. Patient Account Number: [id number] Medical Record Treating RN: Huel Coventry [id number] Number: Other Clinician: 02-25-1981 (34 y.o. Treating [name], [name] Date of Birth/Sex: Female) Physician/Extender: G Primary Care [name] Physician: Referring Physician: [name] in Treatment: 80 Encounter Discharge Information Items Discharge Pain Level: 0 Discharge Condition: Stable Ambulatory Status: Wheelchair Discharge  Destination: Home Transportation: Private Auto Accompanied By: mom Schedule Follow-up Appointment: Yes Medication Reconciliation completed and provided to Patient/Care Yes Tahmid Stonehocker: Provided on Clinical Summary of Care: 06/17/2016 Form Type Recipient Paper Patient HB Electronic Signature(s) Signed: 06/17/2016 11:07:07 AM By: Gwenlyn Perking Entered By: Gwenlyn Perking on 06/17/2016 11:07:07 [name] ([id number]) -------------------------------------------------------------------------------- Lower Extremity Assessment Details [name] Date of Service: 06/17/2016 10:00 AM Patient Name: N. Patient Account Number: [id number] Medical Record Treating RN: Huel Coventry [id number] Number: Other Clinician: [birth year] (34 y.o. Treating [name], [name] Date of Birth/Sex: Female) Physician/Extender: G Primary Care [name] Physician: Referring Physician: [name] in Treatment: 36 Vascular Assessment Pulses: Posterior Tibial Dorsalis Pedis Palpable: [Left:Yes] Extremity colors, hair growth, and conditions: Extremity Color: [Left:Normal] Hair Growth on Extremity: [Left:Yes] Temperature of Extremity: [Left:Warm] Dependent Rubor: [Left:No] Blanched when Elevated: [Left:No] Lipodermatosclerosis: [Left:No] Electronic Signature(s) Signed: 06/17/2016 5:11:18 PM By: Elliot Gurney, RN, [name], Kim RN, [name] Entered By: Elliot Gurney, RN, [name], Kim on 06/17/2016 10:31:42 [name] ([id number]) -------------------------------------------------------------------------------- Multi Wound Chart Details [name] Date of Service: 06/17/2016 10:00 AM Patient Name: N. Patient Account Number: [id number] Medical Record Treating RN: Huel Coventry [id number] Number: Other Clinician: 1980/10/06 (34 y.o. 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] [17:Left Amputation Site - Left Ischial Tuberosity Transmetatarsal] Wounding Event: [16:Pressure Injury] [17:Surgical Injury] [18:Pressure Injury] Primary Etiology: [16:Pressure Ulcer] [17:Open Surgical Wound] [18:Pressure Ulcer] Comorbid History: [16:Anemia, Type I Diabetes, Anemia, Type I Diabetes, Anemia, Type I Diabetes, End Stage Renal Disease, End Stage Renal Disease, End Stage Renal Disease, Rheumatoid Arthritis, Neuropathy] [17:Rheumatoid Arthritis, Rheumatoid Arthritis,  Neuropathy] [18:Neuropathy] Date Acquired: [16:04/02/2016] [17:04/13/2016] [18:05/12/2016] Weeks of Treatment: [16:10] [17:7] [18:2] Wound Status: [16:Open] [17:Open] [18:Open] Measurements L x W x D  3.2x2.5x0.4 [17:1.7x10x1] [18:4.5x3.3x0.1] (cm) Area (cm) : [16:6.283] [17:13.352] [18:11.663] Volume (cm) : [16:2.513] [17:13.352] [18:1.166] % Reduction in Area: [16:-5.80%] [17:-1489.50%] [18:13.20%] % Reduction in Volume: -323.10% [17:-15795.20%] [18:13.20%] Classification: [16:Category/Stage III] [17:Partial Thickness] [18:Category/Stage II] HBO Classification: [16:Grade 1] [17:Grade 1] [18:N/A] Exudate Amount: [16:Large] [17:Medium] [18:Large] Exudate Type: [16:Serosanguineous] [17:Serous] [18:Serous] Exudate Color: [16:red, brown] [17:amber] [18:amber] Wound Margin: [16:Distinct, outline attached Flat and Intact] [18:Flat and Intact] Granulation Amount: [16:Small (1-33%)] [17:Large (67-100%)] [18:Medium (34-66%)] Granulation Quality: [16:Red, Pink] [17:Red] [18:N/A] Necrotic Amount: Large (67-100%) Small (1-33%) Medium (34-66%) Necrotic Tissue: Eschar, Adherent Slough Adherent [employer], Adherent  Slough Exposed Structures: Fat: Yes Fascia: No Fascia: No Fat: No Fat: No Tendon: No Tendon: No Muscle: No Muscle: No Joint: No Joint: No Bone: No Bone: No Limited to Skin Limited to Skin Breakdown Breakdown Epithelialization: None None Small (1-33%) Periwound Skin Texture: Edema: Yes Edema: No Edema: No Excoriation: No Excoriation: No Excoriation: No Induration: No Induration: No Induration: No Callus: No Callus: No Callus: No Crepitus: No Crepitus: No Crepitus: No Fluctuance: No Fluctuance: No Fluctuance: No Friable: No Friable: No Friable: No Rash: No Rash: No Rash: No Scarring: No Scarring: No Scarring: No Periwound Skin Maceration: No Moist: Yes Moist: Yes Moisture: Moist: No Maceration: No Maceration: No Dry/Scaly: No Dry/Scaly: No Dry/Scaly: No Periwound Skin Color: Erythema: Yes Atrophie Blanche: No Erythema: Yes Atrophie Blanche: No Cyanosis: No Atrophie Blanche: No Cyanosis: No Ecchymosis: No Cyanosis: No Ecchymosis: No Erythema: No Ecchymosis: No Hemosiderin Staining: No Hemosiderin Staining: No Hemosiderin Staining: No Mottled: No Mottled: No Mottled: No Pallor: No Pallor: No Pallor: No Rubor: No Rubor: No Rubor: No Erythema Location: Circumferential N/A Circumferential Erythema Change: No Change N/A N/A Temperature: No Abnormality N/A N/A Tenderness on Yes No No Palpation: Wound Preparation: Ulcer Cleansing: Ulcer Cleansing: Ulcer Cleansing: Rinsed/Irrigated with Rinsed/Irrigated with Rinsed/Irrigated with Saline Saline Saline Topical Anesthetic Topical Anesthetic Topical Anesthetic Applied: Other: lidocaine Applied: Other: lidocaine Applied: Other: lidocaine 4% 4% 4% Treatment Notes Electronic Signature(s) Signed: 06/17/2016 5:11:18 PM By: Elliot Gurney, RN, [name], Kim RN, [name] Entered By: Elliot Gurney, RN, [name], Kim on 06/17/2016 10:37:19 [name] ([id number]) GABRIELLY, MCCRYSTAL  ([id number]) -------------------------------------------------------------------------------- Multi-Disciplinary Care Plan Details [name] Date of Service: 06/17/2016 10:00 AM Patient Name: N. Patient Account Number: [id number] Medical Record Treating RN: Huel Coventry [id number] Number: Other Clinician: Nov 23, 1980 (34 y.o. Treating [name], [name] Date of Birth/Sex: Female) Physician/Extender: G Primary Care [name] Physician: Referring Physician: [name] in Treatment: 90 Active Inactive HBO Nursing Diagnoses: Anxiety related to feelings of confinement associated with the hyperbaric oxygen chamber Anxiety related to knowledge deficit of hyperbaric oxygen therapy and treatment procedures Discomfort related to temperature and humidity changes inside hyperbaric chamber Potential for barotraumas to ears, sinuses, teeth, and lungs or cerebral gas embolism related to changes in atmospheric pressure inside hyperbaric oxygen chamber Potential for oxygen toxicity seizures related to delivery of 100% oxygen at an increased atmospheric pressure Potential for pulmonary oxygen toxicity related to delivery of 100% oxygen at an increased atmospheric pressure Goals: Barotrauma will be prevented during HBO2 Date Initiated: 08/30/2015 Goal Status: Active Patient and/or family will be able to state/discuss factors appropriate to the management of their disease process during treatment Date Initiated: 08/30/2015 Goal Status: Active Patient will tolerate the hyperbaric oxygen therapy treatment Date Initiated: 08/30/2015 Goal Status: Active Patient will tolerate the internal climate of the chamber Date Initiated: 08/30/2015 Goal Status: Active Patient/caregiver will verbalize understanding of HBO goals, rationale, procedures and potential hazards Date Initiated:  08/30/2015 Goal Status: Active Signs and symptoms of pulmonary oxygen toxicity will be recognized and promptly  addressed Date Initiated: 08/30/2015 JOHNATHON, MITTAL ([id number]) Goal Status: Active Signs and symptoms of seizure will be recognized and promptly addressed ; seizing patients will suffer no harm Date Initiated: 08/30/2015 Goal Status: Active Interventions: Administer a five (5) minute air break for patient if signs and symptoms of seizure appear and notify the hyperbaric physician Administer a ten (10) minute air break for patient if signs and symptoms of seizure appear and notify the hyperbaric physician Administer decongestants, per physician orders, prior to HBO2 Administer the correct therapeutic gas delivery based on the patients needs and limitations, per physician order Assess and provide for patientos comfort related to the hyperbaric environment and equalization of middle ear Assess for signs and symptoms related to adverse events, including but not limited to confinement anxiety, pneumothorax, oxygen toxicity and baurotrauma Assess patient for any history of confinement anxiety Assess patient's knowledge and expectations regarding hyperbaric medicine and provide education related to the hyperbaric environment, goals of treatment and prevention of adverse events Implement protocols to decrease risk of pneumothorax in high risk patients Notes: Abuse / Safety / Falls / Self Care Management Nursing Diagnoses: Potential for falls Goals: Patient will remain injury free Date Initiated: 09/19/2015 Goal Status: Active Patient/caregiver will verbalize/demonstrate measures taken to prevent injury and/or falls Date Initiated: 09/19/2015 Goal Status: Active Interventions: Assess fall risk on admission and as needed Assess impairment of mobility on admission and as needed per policy Notes: Orientation to the Wound Care Program Nursing Diagnoses: SELBY, SLOVACEK ([id number]) Knowledge deficit related to the wound healing center program Goals: Patient/caregiver will  verbalize understanding of the Wound Healing Center Program Date Initiated: 08/30/2015 Goal Status: Active Interventions: Provide education on orientation to the wound center Notes: Pressure Nursing Diagnoses: Knowledge deficit related to causes and risk factors for pressure ulcer development Knowledge deficit related to management of pressures ulcers Potential for impaired tissue integrity related to pressure, friction, moisture, and shear Goals: Patient will remain free from development of additional pressure ulcers Date Initiated: 08/30/2015 Goal Status: Active Patient will remain free of pressure ulcers Date Initiated: 08/30/2015 Goal Status: Active Patient/caregiver will verbalize risk factors for pressure ulcer development Date Initiated: 08/30/2015 Goal Status: Active Patient/caregiver will verbalize understanding of pressure ulcer management Date Initiated: 08/30/2015 Goal Status: Active Interventions: Assess: immobility, friction, shearing, incontinence upon admission and as needed Assess offloading mechanisms upon admission and as needed Assess potential for pressure ulcer upon admission and as needed Provide education on pressure ulcers Treatment Activities: Patient referred for home evaluation of offloading devices/mattresses : 01/15/2016 Patient referred for pressure reduction/relief devices : 01/15/2016 Pressure reduction/relief device ordered : 01/15/2016 Notes: TANISHKA, DROLET ([id number]) Wound/Skin Impairment Nursing Diagnoses: Impaired tissue integrity Knowledge deficit related to ulceration/compromised skin integrity Goals: Patient will have a decrease in wound volume by X% from date: (specify in notes) Date Initiated: 08/30/2015 Goal Status: Active Patient/caregiver will verbalize understanding of skin care regimen Date Initiated: 08/30/2015 Goal Status: Active Ulcer/skin breakdown will have a volume reduction of 30% by week 4 Date Initiated:  08/30/2015 Goal Status: Active Ulcer/skin breakdown will have a volume reduction of 50% by week 8 Date Initiated: 08/30/2015 Goal Status: Active Ulcer/skin breakdown will have a volume reduction of 80% by week 12 Date Initiated: 08/30/2015 Goal Status: Active Ulcer/skin breakdown will heal within 14 weeks Date Initiated: 08/30/2015 Goal Status: Active Interventions: Assess patient/caregiver ability to obtain necessary supplies Assess patient/caregiver ability  to perform ulcer/skin care regimen upon admission and as needed Assess ulceration(s) every visit Provide education on ulcer and skin care Notes: Electronic Signature(s) Signed: 06/17/2016 5:11:18 PM By: Elliot Gurney, RN, [name], Kim RN, [name] Entered By: Elliot Gurney, RN, [name], Kim on 06/17/2016 10:37:05 [name] ([id number]) -------------------------------------------------------------------------------- Pain Assessment Details [name] Date of Service: 06/17/2016 10:00 AM Patient Name: N. Patient Account Number: [id number] Medical Record Treating RN: Huel Coventry [id number] Number: Other Clinician: August 15, 1981 (34 y.o. Treating [name], [name] Date of Birth/Sex: Female) Physician/Extender: G Primary Care [name] Physician: Referring Physician: [name] in Treatment: 21 Active Problems Location of Pain Severity and Description of Pain Patient Has Paino No Site Locations With Dressing Change: No Pain Management and Medication Current Pain Management: Electronic Signature(s) Signed: 06/17/2016 5:11:18 PM By: Elliot Gurney, RN, [name], Kim RN, [name] Entered By: Elliot Gurney, RN, [name], Kim on 06/17/2016 10:30:18 [name] ([id number]) -------------------------------------------------------------------------------- Patient/Caregiver Education Details [name] Date of Service: 06/17/2016 10:00 AM Patient Name: N. Patient Account Number: [id number] Medical Record Treating RN: Huel Coventry [id number] Number: Other Clinician: July 19, 1981 (34 y.o. Treating [name], [name] Date of Birth/Gender: Female) Physician/Extender: G Primary Care Weeks in Treatment: 74 [name] Physician: Referring Physician: [name] Education Assessment Education Provided To: Patient Education Topics Provided Wound/Skin Impairment: Handouts: Caring for Your Ulcer Methods: Demonstration Responses: State content correctly [employer]) Signed: 06/17/2016 5:11:18 PM By: Elliot Gurney, RN, [name], Kim RN, [name] Entered By: Elliot Gurney, RN, [name], Kim on 06/17/2016 11:06:25 [name] ([id number]) -------------------------------------------------------------------------------- Wound Assessment Details [name] Date of Service: 06/17/2016 10:00 AM Patient Name: N. Patient Account Number: [id number] Medical Record Treating RN: Huel Coventry [id number] Number: Other Clinician: 1981/06/11 (34 y.o. 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 Wound Open Status: Wounding Event: Pressure Injury Comorbid Anemia, Type I Diabetes, End Stage Date Acquired: 04/02/2016 History: Renal Disease, Rheumatoid Arthritis, Weeks Of Treatment: 10 Neuropathy Clustered Wound: No Photos Photo Uploaded By: Elpidio Eric on 06/17/2016 16:43:06 Wound Measurements Length: (cm) 3.2 Width: (cm) 2.5 Depth: (cm) 0.4 Area: (cm) 6.283 Volume: (cm) 2.513 % Reduction in Area: -5.8% % Reduction in Volume: -323.1% Epithelialization: None Tunneling: No Undermining: No Wound Description KAOIR, LOREE ([id number]) Classification: Category/Stage III Foul Odor After Cleansing: No Diabetic Severity [address]): Grade 1 Wound Margin: Distinct,  outline attached Exudate Amount: Large Exudate Type: Serosanguineous Exudate Color: red, brown Wound Bed Granulation Amount: Small (1-33%) Exposed Structure Granulation Quality: Red, Pink Fat Layer Exposed: Yes Necrotic Amount: Large (67-100%) Necrotic Quality: Eschar, Adherent Slough Periwound Skin Texture Texture Color No Abnormalities Noted: No No Abnormalities Noted: No Callus: No Atrophie Blanche: No Crepitus: No Cyanosis: No Excoriation: No Ecchymosis: No Fluctuance: No Erythema: Yes Friable: No Erythema Location: Circumferential Induration: No Erythema Change: No Change Localized Edema: Yes Hemosiderin Staining: No Rash: No Mottled: No Scarring: No Pallor: No Rubor: No Moisture No Abnormalities Noted: No Temperature / Pain Dry / Scaly: No Temperature: No Abnormality Maceration: No Tenderness on Palpation: Yes Moist: No Wound Preparation Ulcer Cleansing: Rinsed/Irrigated with Saline Topical Anesthetic Applied: Other: lidocaine 4%, Treatment Notes Wound #16 (Right, Posterior Amputation Site - Below Knee) Electronic Signature(s) Signed: 06/17/2016 5:11:18 PM By: Elliot Gurney, RN, [name], Kim RN, [name] Entered By: Elliot Gurney, RN, [name], Kim on 06/17/2016 10:32:37 [name] ([id number]) -------------------------------------------------------------------------------- Wound Assessment Details [name] Date  of Service: 06/17/2016 10:00 AM Patient Name: N. Patient Account Number: [id number] Medical Record Treating RN: Huel CoventryWoody, Kim [id number] Number: Other Clinician: 06-19-1981 (34 y.o. Treating [name], [name] Date of Birth/Sex: Female) Physician/Extender: G Primary Care Rolin Barrylmedo, Mario Physician: Referring Physician: [name] Partridgelmedo, Mario Weeks in Treatment: 41 Wound Status Wound Number: 17 Primary Open Surgical Wound Etiology: Wound Location: Left Amputation Site - Transmetatarsal Wound Open Status: Wounding Event: Surgical Injury Comorbid Anemia, Type I  Diabetes, End Stage Date Acquired: 04/13/2016 History: Renal Disease, Rheumatoid Arthritis, Weeks Of Treatment: 7 Neuropathy Clustered Wound: No Photos Photo Uploaded By: Elpidio EricAfful, [name] on 06/17/2016 16:43:27 Wound Measurements Length: (cm) 1.7 % Reduction i Width: (cm) 10 % Reduction i Depth: (cm) 1 Epithelializa Area: (cm) 13.352 Tunneling: Volume: (cm) 13.352 Undermining: Starting P Ending Pos Maximum Di n Area: -1489.5% n Volume: -15795.2% tion: None No Yes osition (o'clock): 6 ition (o'clock): 9 stance: (cm) 0.8 Wound Description Classification: Partial Thickness Foul Odor Aft Diabetic Severity [address]([place]): Grade 1 Wound Margin: Flat and Intact Ellenberger, Adanna N. ([id number]) er Cleansing: No Exudate Amount: Medium Exudate Type: Serous Exudate Color: amber Wound Bed Granulation Amount: Large (67-100%) Exposed Structure Granulation Quality: Red Fascia Exposed: No Necrotic Amount: Small (1-33%) Fat Layer Exposed: No Necrotic Quality: Adherent Slough Tendon Exposed: No Muscle Exposed: No Joint Exposed: No Bone Exposed: No Limited to Skin Breakdown Periwound Skin Texture Texture Color No Abnormalities Noted: No No Abnormalities Noted: No Callus: No Atrophie Blanche: No Crepitus: No Cyanosis: No Excoriation: No Ecchymosis: No Fluctuance: No Erythema: No Friable: No Hemosiderin Staining: No Induration: No Mottled: No Localized Edema: No Pallor: No Rash: No Rubor: No Scarring: No Moisture No Abnormalities Noted: No Dry / Scaly: No Maceration: No Moist: Yes Wound Preparation Ulcer Cleansing: Rinsed/Irrigated with Saline Topical Anesthetic Applied: Other: lidocaine 4%, Treatment Notes 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 Cleansed with: Clean wound with Normal Saline 2. Anesthetic Topical Lidocaine 4% cream to wound bed prior to debridement 4. Dressing Applied: Aquacel Ag 5. Secondary Dressing Applied ABD and  Kerlix/Conform Diana EvesBLACKBURN, Aliviah N. ([id number]) Electronic Signature(s) Signed: 06/17/2016 5:11:18 PM By: Elliot GurneyWoody, RN, [name], Kim RN, [name] Entered By: Elliot GurneyWoody, RN, [name], Kim on 06/17/2016 10:46:58 Diana EvesBLACKBURN, Josefita N. ([id number]) -------------------------------------------------------------------------------- Wound Assessment Details Shanon PayorBLACKBURN, Douglas Date of Service: 06/17/2016 10:00 AM Patient Name: N. Patient Account Number: [id number] Medical Record Treating RN: Huel CoventryWoody, Kim [id number] Number: Other Clinician: 06-19-1981 (34 y.o. Treating [name], [name] Date of Birth/Sex: Female) Physician/Extender: G Primary Care Rolin Barrylmedo, Mario Physician: Referring Physician: [name] Partridgelmedo, Mario Weeks in Treatment: 41 Wound Status Wound Number: 18 Primary Pressure Ulcer Etiology: Wound Location: Left Ischial Tuberosity Wound Open Wounding Event: Pressure Injury Status: Date Acquired: 05/12/2016 Comorbid Anemia, Type I Diabetes, End Stage Weeks Of Treatment: 2 History: Renal Disease, Rheumatoid Arthritis, Clustered Wound: No Neuropathy Photos Photo Uploaded By: Elpidio EricAfful, [name] on 06/17/2016 16:43:28 Wound Measurements Length: (cm) 4.5 Width: (cm) 3.3 Depth: (cm) 0.1 Area: (cm) 11.663 Volume: (cm) 1.166 % Reduction in Area: 13.2% % Reduction in Volume: 13.2% Epithelialization: Small (1-33%) Tunneling: No Undermining: No Wound Description Classification: Category/Stage II Wound Margin: Flat and Intact Exudate Amount: Large Exudate Type: Serous Exudate Color: amber Wound Bed Thul, Shantella N. ([id number]) Granulation Amount: Medium (34-66%) Exposed Structure Necrotic Amount: Medium (34-66%) Fascia Exposed: No Necrotic Quality: Eschar, Adherent Slough Fat Layer Exposed: No Tendon Exposed: No Muscle Exposed: No Joint Exposed: No Bone Exposed: No Limited to Skin Breakdown Periwound Skin Texture Texture Color No Abnormalities Noted: No No Abnormalities Noted: No Callus:  No Atrophie  Blanche: No Crepitus: No Cyanosis: No Excoriation: No Ecchymosis: No Fluctuance: No Erythema: Yes Friable: No Erythema Location: Circumferential Induration: No Hemosiderin Staining: No Localized Edema: No Mottled: No Rash: No Pallor: No Scarring: No Rubor: No Moisture No Abnormalities Noted: No Dry / Scaly: No Maceration: No Moist: Yes Wound Preparation Ulcer Cleansing: Rinsed/Irrigated with Saline Topical Anesthetic Applied: Other: lidocaine 4%, Treatment Notes Wound #18 (Left Ischial Tuberosity) Electronic Signature(s) Signed: 06/17/2016 5:11:18 PM By: Elliot Gurney, RN, [name], Kim RN, [name] Entered By: Elliot Gurney, RN, [name], Kim on 06/17/2016 10:35:44 [name] ([id number]) -------------------------------------------------------------------------------- Wound Assessment Details [name] Date of Service: 06/17/2016 10:00 AM Patient Name: N. Patient Account Number: [id number] Medical Record Treating RN: Huel Coventry [id number] Number: Other Clinician: January 19, 1981 (34 y.o. Treating [name], [name] Date of Birth/Sex: Female) Physician/Extender: G Primary Care [name] Physician: Referring Physician: [name] in Treatment: 43 Wound Status Wound Number: 9 Primary Diabetic Wound/Ulcer of the Lower Etiology: Extremity Wound Location: Left Calcaneous Wound Open Wounding Event: Pressure Injury Status: Date Acquired: 07/28/2015 Comorbid Anemia, Type I Diabetes, End Stage Weeks Of Treatment: 41 History: Renal Disease, Rheumatoid Arthritis, Clustered Wound: No Neuropathy Photos Photo Uploaded By: Elpidio Eric on 06/17/2016 16:51:25 Wound Measurements % Reduction in Area: % Reduction in Volume: Epithelialization: None Wound Description Classification: Grade 2 Wound Margin: Flat and Intact Exudate Amount: None Present Foul Odor After Cleansing: No Wound Bed Granulation Amount: Medium (34-66%) Exposed Structure Granulation Quality:  Red Fascia Exposed: No Necrotic Amount: Medium (34-66%) Fat Layer Exposed: No Necrotic Quality: Eschar, Adherent Slough Tendon Exposed: No Doddridge, Vineta N. ([id number]) Muscle Exposed: No Joint Exposed: No Bone Exposed: No Limited to Skin Breakdown Periwound Skin Texture Texture Color No Abnormalities Noted: No No Abnormalities Noted: No Callus: No Atrophie Blanche: No Crepitus: No Cyanosis: No Excoriation: No Ecchymosis: No Fluctuance: No Erythema: No Friable: No Hemosiderin Staining: No Induration: No Mottled: No Localized Edema: No Pallor: No Rash: No Rubor: No Scarring: No Moisture No Abnormalities Noted: No Dry / Scaly: No Maceration: No Moist: Yes Wound Preparation Ulcer Cleansing: Rinsed/Irrigated with Saline Topical Anesthetic Applied: None Assessment Notes Graft applied by podiatrist last week. Did not access wound. Electronic Signature(s) Signed: 06/17/2016 5:11:18 PM By: Elliot Gurney, RN, [name], Kim RN, [name] Entered By: Elliot Gurney, RN, [name], Kim on 06/17/2016 10:50:49 [name] ([id number]) -------------------------------------------------------------------------------- Vitals Details [name] Date of Service: 06/17/2016 10:00 AM Patient Name: N. Patient Account Number: [id number] Medical Record Treating RN: Huel Coventry [id number] Number: Other Clinician: [birth year] (34 y.o. Treating [name], [name] Date of Birth/Sex: Female) Physician/Extender: [facility] [name] Physician: Referring Physician: [name] in Treatment: 65 Vital Signs Time Taken: 10:30 Temperature (F): 97.7 Height (in): 68 Pulse (bpm): 81 Weight (lbs): 96 Respiratory Rate (breaths/min): 16 Body Mass Index (BMI): 14.6 Blood Pressure (mmHg): 143/92 Reference Range: 80 - 120 mg / dl Electronic Signature(s) Signed: 06/17/2016 5:11:18 PM By: Elliot Gurney, RN, [name], Kim RN, [name] Entered By: Elliot Gurney, RN, [name], Kim on 06/17/2016 10:30:41

## 2016-06-24 ENCOUNTER — Encounter: Payer: Medicare Other | Admitting: Internal Medicine

## 2016-06-24 DIAGNOSIS — E10621 Type 1 diabetes mellitus with foot ulcer: Secondary | ICD-10-CM | POA: Diagnosis not present

## 2016-07-01 ENCOUNTER — Ambulatory Visit: Payer: Medicare Other | Admitting: Physician Assistant

## 2016-07-03 NOTE — Progress Notes (Signed)
[name], [name] ([id number]) Visit Report for 06/24/2016 Chief Complaint Document Details [name], Wu Date of Service: 06/24/2016 3:45 PM Patient Name: N. Patient Account Number: [id number] Medical Record Treating RN: [name] [id number] Number: Other Clinician: [birth year] (34 y.o. [name] Date of Birth/Sex: Female) Physician/Extender: G Primary Care [name] Physician: Referring Physician: [name] Wu in Treatment: 67 Information Obtained from: Patient Chief Complaint Patient in today for treatment of non-healing wound and HBO Treatment. she has just gotten out of hospital this week and is back to resume her hyperbaric oxygen therapy Electronic Signature(s) Signed: 07/03/2016 8:10:29 AM By: [name] Najjar MD Entered By: [name] on 06/24/2016 17:27:55 [name] ([id number]) -------------------------------------------------------------------------------- HPI Details [name] Date of Service: 06/24/2016 3:45 PM Patient Name: N. Patient Account Number: [id number] Medical Record Treating RN: [name] [id number] Number: Other Clinician: 1981-01-20 (34 y.o. [name] Date of Birth/Sex: Female) Physician/Extender: G Primary Care [name] Physician: Referring Physician: [name] Wu in Treatment: 71 History of Present Illness Location: dry gangrene both feet and heels Quality: Patient reports No Pain. Severity: Patient states wound are getting worse. Duration: Patient has had the wound for > 4months prior to seeking treatment at the wound center Context: The wound appeared gradually over time Modifying Factors: she has been in and out of hospital over the last 2 months Associated Signs and Symptoms: Patient reports having difficulty standing for long periods. HPI Description: [name] is a 35 y.o. female who presents to our wound center, back in June 2016,  referred by her PCP [name] for nonhealing ulcers on the lateral aspect of the right heel. Of note she has a history of type 1 diabetes mellitus that has been uncontrolled. Past medical history significant for type 1 diabetes mellitus not controlled, ankylosing spondylitis, anorexia nervosa, irritable bowel syndrome, chronic kidney disease, chronic diarrhea. she then developed gangrene of both feet due to severe peripheral vascular disease and also had gangrene of the tips of her fingers due to upper extremity vascular disease. She was being worked up by vascular surgery at [facility] and at [facility] and has had several procedures done there. She started with hyperbaric oxygen therapy and had a total of 40 treatments the last one being on 06/20/2015. After the initial treatment of hyperbaric oxygen therapy she started having ear problems and had ultimately to use myringotomy tubes and this was done bilaterally. Since then her ears have been doing fine. In late September, she had seen vascular and hand surgeons. since then she's been in [place] at the rec center for surgery involving extensive vascular procedures for the upper extremities. She was then at [facility] with GI bleeds both upper and lower and has been in and out of hospital for that. She has recently been out of hospital for the last week. 09/09/2015 -- she was unable to get here in time to start her hyperbaric oxygen today and hence is only here for a wound care visit. 09/19/2015 -- she has been having vancomycin during her dialysis and continues to have vascular appointments and the procedure is been set for early January. She has been unable to make it for her hemodialysis due to various medical symptoms. 09/30/2014 -- her vancomycin was stopped on 09/25/2015 and the mother has noticed the right foot has started draining for the last 3 days. Addendum: after examining the patient I was able to talk to her primary vascular surgeon Dr.  Pernell Wu at the [facility]. I told him  about the necrotic area on the plantar aspect of right foot which is now wet gangrene and he agreed with me that he would admit her at [facility] under her care and synchronize further treatment. We have discussed her poor prognosis and he and I discussed the need for hospice care [name], [name] ([id number]) and for sitting down and talking to the patient and her mother and giving them a proper detail of the prognosis. 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 She was worked up with MRIs, abdomen aortogram and placement of left-sided angioplasties were done. After a prolonged hospital she she was discharged home and was told to wear shrinker sock and stump protector and see her surgeon for further instructions regarding wound care and suture removal. Asked to take long-term doxycycline. 01/15/16; this is a patient I haven't seen before although she is been followed by Dr. Meyer Wu in this clinic today. She is a type I diabetic with severe PAD macrovascular disease. She has had a previous BKA. She has dry gangrene of the tips of her fingers which she showed me on the right to. She also has dry gangrene of the left first second and third toes and a portion of her proximal foot around these areas. She is followed by vascular surgery at [facility] and saw them recently they are not going to do surgery as of yet. She has a large black eschar over her heel which is beginning to separate in some areas. As I understand think she is paining these with Betadine. There is been  some suggestion about retrying hyperbarics on her although she is still not able to commit to the frequency of treatment that would be necessary to see improvement. She is also on Monday Wednesday Friday dialysis 01/21/16; the patient returns to see me today with regards to the left heel. Apparently she is not scheduled for any further attempts at revascularization of the left foot. 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 01/28/16; the patient returns today for attention to the left foot specifically the left calcaneal ulcer. This is covered in a thick black eschar. I crosshatched this last week and we have been using Santyl. 02/05/16; I continue to work on the thick black eschar on the patient's left calcaneus. She is using Santyl that this side crosshatched this area and the eschar is beginning to loosen. 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 This is beginning to separate and liquefy as well especially between the first and second toes. In the time being her major complaint is fatigue at dialysis 02/26/16 I continue to work on the patient's left calcaneus thick adherent black eschar. I crosshatched this area and we have been using Santyl although it is very adherent area I remove some nonviable tissue today what I can see of this actually looks surprisingly good. On the same foot she has dry gangrene on the first and second toes and part  of the forefoot underneath this. This is beginning to separate. 03/11/16; the patient comes in for her every 2 week appointment. I have been working on the black eschar on her heel. The patient apparently again has to come off dialysis early today after 2 hours due to severe complaints of nausea. She really does not look well. 04/02/2016 -- the patient  has not been seen here for about 3 weeks now and has a new issue with the stump of the right amputation site and also her right posterior thigh. They have only noticed this for the last couple of days. 04/28/16; I had received a call from the patient's surgeon at [facility]. She had a left transmetatarsal amputation and Integra applied to the left heel. Both of these areas appear to be doing well. There are dressing these with [facility] and they will return to their surgeon on Thursday. She has a new injury on the popliteal fossa on the right which I think was trauma from her stump. 05/20/16; the patient is following with her surgeon at [facility]. She's had a left transmetatarsal debridement of the left heel she had Integra place and apparently is using some consternation of Epson salts soaks, Betadine and Aquacel Ag. The left leg is wrapped I did not look at this today. She has the wound in her right popliteal fossa which is apparently a pressure area possibly related to sitting on a toilet for 2-3 hours multiple times a day. 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 We have been using Santyl to the area on the right popliteal fossa. She has Proano, Silvanna N. ([id number]) a new wound on her right gluteal area but the patient would not let me look at that today 06/03/16; patient is not doing particularly well. She now has a pressure area over her left ischial tuberosity. This is of quite a size and covered with an adherent surface slough. She looks as though she has lost weight, I didn't want to go ahead and attempt to debridement this today. There is really no evidence of infection. The area behind the right popliteal fossa looks about the same as 2 weeks ago we have been using Santyl to this area. I did not look at her left foot which is wrapped been followed by podiatry at [facility] 06/10/16; patient arrives in clinic actually looking  a lot brighter than I usually see her, predictably she did not go to dialysis today. For the first time in perhaps 6 weeks I actually saw her left foot today. The transmetatarsal site is healed. The left heel has a reasonably stable-looking wound which has a clean base. Some eschar superiorly and a few sutures remain in place. More worrisome is an area on her lateral left foot over the metatarsal head. Quarter size necrotic wound that probes to bone. There is some purulence here which I cultured there is nothing that looks like a healthy base of this area. They've been using silver alginate to this area at home. She also has a large wound in the right popliteal fossa, and again a pressure area over the left ischial tuberosity. We are using Santyl to both of these areas. Both allays looks somewhat better than last week, using Santyl to both areas 06/17/16: culture from last week grew citrobacter and amp sensitive enterococcus fecalis. Started on vanc last Saturday at dialysis. have spoken to dialysis in [place] re adjustment in antibiotics added ceftazidine to vanc  06/24/16; the patient was discharged from [facility] yesterday. She had an IandD of the open area on the lateral aspect of her foot. She continues on vancomycin and Ceptaz again at dialysis although I'm not exactly sure of the current duration of this. She also had a debridement of the wound over the left greater trochanter done by the wound care team. She is receiving Santyl based dressings to this and the area in her right popliteal fossa. She arrives today completely fatigued from dialysis. I spoke to Dr. Allena Katz her podiatrist and surgeon at [facility] and asked if we could manage the wound VAC which I think we can. He also wanted to ask about hyperbaric oxygen with the indication of chronic osteomyelitis although at this point I am not completely certain where the chronic osteomyelitis is. Finally she apparently has had a re-graft to the  area on her left heel which is either a skin graft or Integra Electronic Signature(s) Signed: 07/03/2016 8:10:29 AM By: [name] Najjar MD Entered By: [name] on 06/24/2016 17:30:55 [name] ([id number]) -------------------------------------------------------------------------------- Physical Exam Details [name] Date of Service: 06/24/2016 3:45 PM Patient Name: N. Patient Account Number: [id number] Medical Record Treating RN: [name] [id number] Number: Other Clinician: [birth year] (34 y.o. Treating Raiyan Dalesandro Date of Birth/Sex: Female) Physician/Extender: G Primary Care [name] Physician: Referring Physician: [name] Wu in Treatment: 34 Constitutional Sitting or standing Blood Pressure is within target range for patient.. . Pulse regular and within target range for patient.Marland Kitchen Respirations regular, non-labored and within target range.. Temperature is normal and within the target range for the patient.. Patient's appearance is neat and clean. Appears in no acute distress. Well nourished and well developed.Marland Kitchen Respiratory Respiratory effort is easy and symmetric bilaterally. Rate is normal at rest and on room air.. Cardiovascular Pedal pulses palpable on the left. Gastrointestinal (GI) Abdomen is soft and non-distended without masses or tenderness. Bowel sounds active in all quadrants.. Psychiatric Patient appears fatigued after dialysis not really able to engage in conversation but seemed to be following the conversation I was having with her mother.. Notes Wound exam; the area over the left greater trochanter has been debrided and is now a deeper wound but with a healthier base. The area in the right popliteal fossa looks much the same as I remember perhaps somewhat better in terms of the surface. She has had Integra placed over the left heel and has a wound VAC over the left lateral foot that I have not removed. Electronic  Signature(s) Signed: 07/03/2016 8:10:29 AM By: [name] Najjar MD Entered By: [name] on 06/24/2016 17:33:50 [name] ([id number]) -------------------------------------------------------------------------------- Physician Orders Details [name] Date of Service: 06/24/2016 3:45 PM Patient Name: N. Patient Account Number: [id number] Medical Record Treating RN: [name] [id number] Number: Other Clinician: 17-May-1981 (34 y.o. [name] Date of Birth/Sex: Female) Physician/Extender: G Primary Care [name] Physician: Referring Physician: [name] Wu in Treatment: 11 Verbal / Phone Orders: Yes Clinician: Curtis Wu Read Back and Verified: Yes Diagnosis Coding Wound Cleansing 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,Posterior Amputation Site - Below Knee o Clean wound with Normal Saline. o Cleanse wound with mild soap and water Wound #18 Left Ischial Tuberosity o Clean wound with Normal Saline. 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,Posterior Amputation Site - Below Knee o Topical Lidocaine 4% cream applied to wound bed prior to debridement Wound #18 Left Ischial Tuberosity o Topical Lidocaine 4% cream applied to wound bed prior to  debridement Primary Wound Dressing 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,Posterior Amputation Site - Below Knee o Santyl Ointment - light gauze packing in left ischial tuberosity Wound #18 Left Ischial Tuberosity o Santyl Ointment - light gauze packing in left ischial tuberosity Secondary Dressing 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,Posterior Amputation Site - Below Knee o Boardered Foam Dressing Wound #18 Left Ischial Tuberosity o Boardered Foam Dressing Dressing Change Frequency 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,Posterior Amputation Site - Below Knee [name], [name] ([id number]) o Change dressing every day. Wound #18 Left Ischial Tuberosity o Change dressing every day. Follow-up Appointments Wound #16  Right,Posterior Amputation Site - Below Knee o Return Appointment in 2 weeks. o Other: - as patient is able Wound #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 o Return Appointment in 2 weeks. o Other: - as patient is able Wound #18 Left Ischial Tuberosity o Return Appointment in 2 weeks. o Other: - as patient is able Wound #9 Left Calcaneous o Return Appointment in 2 weeks. o Other: - as patient is able Medications-please add to medication list. 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,Posterior Amputation Site - Below Knee o Santyl Enzymatic Ointment Wound #18 Left Ischial Tuberosity o Santyl Enzymatic Ointment Electronic Signature(s) Signed: 06/25/2016 4:40:32 PM By: [name] Signed: 07/03/2016 8:10:29 AM By: [name] Najjar MD Entered By: [name] on 06/24/2016 16:43:59 [name] ([id number]) -------------------------------------------------------------------------------- Problem List Details [name] Date of Service: 06/24/2016 3:45 PM Patient Name: N. Patient Account Number: [id number] Medical Record Treating RN: [name] [id number] Number: Other Clinician: 07-31-1981 (34 y.o. Treating Elina Streng Date of Birth/Sex: Female) Physician/Extender: G Primary Care [name] Physician: Referring Physician: [name] Wu in Treatment: 12 Active Problems ICD-10 Encounter Code Description Active Date Diagnosis E10.621 Type 1 diabetes mellitus with foot ulcer 08/30/2015 Yes E10.52 Type 1 diabetes mellitus with diabetic peripheral 08/30/2015 Yes angiopathy with gangrene I70.245 Atherosclerosis of native arteries of left leg with ulceration 08/30/2015 Yes of other part of foot I70.261 Atherosclerosis of native arteries of extremities with 08/30/2015 Yes gangrene, right leg L89.622 Pressure ulcer of left heel, stage 2 08/30/2015 Yes Z89.511 Acquired absence of right leg below knee 11/29/2015 Yes T87.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, right  lower extremity 04/02/2016 Yes S71.101A Unspecified open wound, right thigh, initial encounter 04/02/2016 Yes L89.322 Pressure ulcer of left buttock, stage 2 06/10/2016 Yes Janik, Sophya NMarland Kitchen ([id number]) Inactive Problems Resolved Problems ICD-10 Code Description Active Date Resolved Date L02.611 Cutaneous abscess of right foot 10/01/2015 10/01/2015 Electronic Signature(s) Signed: 07/03/2016 8:10:29 AM By: [name] Najjar MD Entered By: [name] on 06/24/2016 17:27:39 [name] ([id number]) -------------------------------------------------------------------------------- Progress Note Details [name] Date of Service: 06/24/2016 3:45 PM Patient Name: N. Patient Account Number: [id number] Medical Record Treating RN: [name] [id number] Number: Other Clinician: 1981/03/27 (34 y.o. Treating Charnette Younkin Date of Birth/Sex: Female) Physician/Extender: G Primary Care [name] Physician: Referring Physician: [name] Wu in Treatment: 15 Subjective Chief Complaint Information obtained from Patient Patient in today for treatment of non-healing wound and HBO Treatment. she has just gotten out of hospital this week and is back to resume her hyperbaric oxygen therapy History of Present Illness (HPI) The following HPI elements were documented for the patient's wound: Location: dry gangrene both feet and heels Quality: Patient reports No Pain. Severity: Patient states wound are getting worse. Duration: Patient has had the wound for > 48months prior to seeking treatment at the wound center Context: The wound appeared gradually over time Modifying Factors: she has been in and out of hospital over the last 2 months Associated  Signs and Symptoms: Patient reports having difficulty standing for long periods. [name] is a 35 y.o. female who presents to our wound center, back in June 2016, referred by her PCP [name] for nonhealing ulcers on  the lateral aspect of the right heel. Of note she has a history of type 1 diabetes mellitus that has been uncontrolled. Past medical history significant for type 1 diabetes mellitus not controlled, ankylosing spondylitis, anorexia nervosa, irritable bowel syndrome, chronic kidney disease, chronic diarrhea. she then developed gangrene of both feet due to severe peripheral vascular disease and also had gangrene of the tips of her fingers due to upper extremity vascular disease. She was being worked up by vascular surgery at [facility] and at [facility] and has had several procedures done there. She started with hyperbaric oxygen therapy and had a total of 40 treatments the last one being on 06/20/2015. After the initial treatment of hyperbaric oxygen therapy she started having ear problems and had ultimately to use myringotomy tubes and this was done bilaterally. Since then her ears have been doing fine. In late September, she had seen vascular and hand surgeons. since then she's been in [place] at the rec center for surgery involving extensive vascular procedures for the upper extremities. She was then at [facility] with GI bleeds both upper and lower and has been in and out of hospital for that. She has recently been out of hospital for the last week. 09/09/2015 -- she was unable to get here in time to start her hyperbaric oxygen today and hence is only here for a wound care visit. 09/19/2015 -- she has been having vancomycin during her dialysis and continues to have vascular [name], [name] ([id number]) appointments and the procedure is been set for early January. She has been unable to make it for her hemodialysis due to various medical symptoms. 09/30/2014 -- her vancomycin was stopped on 09/25/2015 and the mother has noticed the right foot has started draining for the last 3 days. Addendum: after examining the patient I was able to talk to her primary vascular surgeon [name] at the [facility]. I told him about the necrotic area on the plantar aspect of right foot which is now wet gangrene and he agreed with me that he would admit her at [facility] under her care and synchronize further treatment. We have discussed her poor prognosis and he and I discussed the need for hospice care and for sitting down and talking to the patient and her mother and giving them a proper detail of the prognosis. 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 She was worked up with MRIs, abdomen aortogram and placement of left-sided angioplasties were done. After a prolonged hospital she she was discharged home and was told to wear shrinker sock and stump protector and see her surgeon for further instructions regarding wound care and suture removal. Asked to take long-term doxycycline. 01/15/16; this is a patient I haven't seen before although she is been followed by Dr. Meyer Wu in this clinic today. She is a type I diabetic with severe PAD macrovascular disease. She has had a previous BKA. She has  dry gangrene of the tips of her fingers which she showed me on the right to. She also has dry gangrene of the left first second and third toes and a portion of her proximal foot around these areas. She is followed by vascular surgery at [facility] and saw them recently they are not going to do surgery as of yet. She has a large black eschar over her heel which is beginning to separate in some areas. As I understand think she is paining these with Betadine. There is been some suggestion about retrying hyperbarics on her  although she is still not able to commit to the frequency of treatment that would be necessary to see improvement. She is also on Monday Wednesday Friday dialysis 01/21/16; the patient returns to see me today with regards to the left heel. Apparently she is not scheduled for any further attempts at revascularization of the left foot. 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 01/28/16; the patient returns today for attention to the left foot specifically the left calcaneal ulcer. This is covered in a thick black eschar. I crosshatched this last week and we have been using Santyl. 02/05/16; I continue to work on the thick black eschar on the patient's left calcaneus. She is using Santyl that this side crosshatched this area and the eschar is beginning to loosen. 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 This is beginning to separate and liquefy as well especially between the first and second toes. In the time being her major complaint is fatigue at dialysis 02/26/16 I continue to work on the patient's left calcaneus thick adherent black eschar. I crosshatched this area and we have been using Santyl although it is very adherent area I remove some nonviable tissue today what I can see of this actually looks surprisingly good. On the same foot she has dry gangrene on the first and second toes and part of the forefoot underneath this. This is beginning to separate. 03/11/16; the patient comes in for her every 2 week appointment. I have been working on the black eschar on her heel. The patient apparently again has to come off dialysis early today after 2 hours due to severe complaints of nausea. She really does not look well. 04/02/2016 -- the patient has not been seen here for about 3 weeks now and  has a new issue with the stump of the right amputation site and also her right posterior thigh. They have only noticed this for the last couple of days. 04/28/16; I had received a call from the patient's surgeon at [facility]. She had a left transmetatarsal amputation [name], [name] ([id number]) and Integra applied to the left heel. Both of these areas appear to be doing well. There are dressing these with [facility] and they will return to their surgeon on Thursday. She has a new injury on the popliteal fossa on the right which I think was trauma from her stump. 05/20/16; the patient is following with her surgeon at [facility]. She's had a left transmetatarsal debridement of the left heel she had Integra place and apparently is using some consternation of Epson salts soaks, Betadine and Aquacel Ag. The left leg is wrapped I did not look at this today. She has the wound in her right popliteal fossa  which is apparently a pressure area possibly related to sitting on a toilet for 2-3 hours multiple times a day. 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 We have been using Santyl to the area on the right popliteal fossa. She has a new wound on her right gluteal area but the patient would not let me look at that today 06/03/16; patient is not doing particularly well. She now has a pressure area over her left ischial tuberosity. This is of quite a size and covered with an adherent surface slough. She looks as though she has lost weight, I didn't want to go ahead and attempt to debridement this today. There is really no evidence of infection. The area behind the right popliteal fossa looks about the same as 2 weeks ago we have been using Santyl to this area. I did not look at her left foot which is wrapped been followed by podiatry at [facility] 06/10/16; patient arrives in clinic actually looking a lot brighter than I usually see her,  predictably she did not go to dialysis today. For the first time in perhaps 6 weeks I actually saw her left foot today. The transmetatarsal site is healed. The left heel has a reasonably stable-looking wound which has a clean base. Some eschar superiorly and a few sutures remain in place. More worrisome is an area on her lateral left foot over the metatarsal head. Quarter size necrotic wound that probes to bone. There is some purulence here which I cultured there is nothing that looks like a healthy base of this area. They've been using silver alginate to this area at home. She also has a large wound in the right popliteal fossa, and again a pressure area over the left ischial tuberosity. We are using Santyl to both of these areas. Both allays looks somewhat better than last week, using Santyl to both areas 06/17/16: culture from last week grew citrobacter and amp sensitive enterococcus fecalis. Started on vanc last Saturday at dialysis. have spoken to dialysis in [place] re adjustment in antibiotics added ceftazidine to vanc 06/24/16; the patient was discharged from [facility] yesterday. She had an IandD of the open area on the lateral aspect of her foot. She continues on vancomycin and Ceptaz again at dialysis although I'm not exactly sure of the current duration of this. She also had a debridement of the wound over the left greater trochanter done by the wound care team. She is receiving Santyl based dressings to this and the area in her right popliteal fossa. She arrives today completely fatigued from dialysis. I spoke to Dr. Allena Katz her podiatrist and surgeon at [facility] and asked if we could manage the wound VAC which I think we can. He also wanted to ask about hyperbaric oxygen with the indication of chronic osteomyelitis although at this point I am not completely certain where the chronic osteomyelitis is. Finally she apparently has had a re-graft to the area on her left heel which is either a  skin graft or Integra Objective Constitutional Sitting or standing Blood Pressure is within target range for patient.. Pulse regular and within target range for patient.Marland Kitchen Respirations regular, non-labored and within target range.. Temperature is normal and within the target range for the patient.. Patient's appearance is neat and clean. Appears in no acute distress. Well nourished and well developed.Marland Kitchen [name], [name] ([id number]) Vitals Time Taken: 4:06 PM, Height: 68 in, Weight: 96 lbs, BMI: 14.6, Pulse: 68  bpm, Respiratory Rate: 16 breaths/min, Blood Pressure: 129/70 mmHg. Respiratory Respiratory effort is easy and symmetric bilaterally. Rate is normal at rest and on room air.. Cardiovascular Pedal pulses palpable on the left. Gastrointestinal (GI) Abdomen is soft and non-distended without masses or tenderness. Bowel sounds active in all quadrants.. Psychiatric Patient appears fatigued after dialysis not really able to engage in conversation but seemed to be following the conversation I was having with her mother.. General Notes: Wound exam; the area over the left greater trochanter has been debrided and is now a deeper wound but with a healthier base. The area in the right popliteal fossa looks much the same as I remember perhaps somewhat better in terms of the surface. She has had Integra placed over the left heel and has a wound VAC over the left lateral foot that I have not removed. Integumentary (Hair, Skin) Wound #16 status is Open. Original cause of wound was Pressure Injury. 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 The wound measures 3.4cm length x 2.4cm width x 0.4cm depth; 6.409cm^2 area and 2.564cm^3 volume. There is fat exposed. There is no tunneling or undermining noted. There is a large amount of serosanguineous drainage noted. The wound margin is distinct with the outline attached to the wound base. There is medium (34-66%) red, pink  granulation within the wound bed. There is a medium (34-66%) amount of necrotic tissue within the wound bed including Eschar and Adherent Slough. The periwound skin appearance exhibited: Localized Edema, Erythema. The periwound skin appearance did not exhibit: Callus, Crepitus, Excoriation, Fluctuance, Friable, Induration, Rash, Scarring, Dry/Scaly, Maceration, Moist, Atrophie Blanche, Cyanosis, Ecchymosis, Hemosiderin Staining, Mottled, Pallor, Rubor. The surrounding wound skin color is noted with erythema which is circumferential. Periwound temperature was noted as No Abnormality. The periwound has tenderness on palpation. Wound #18 status is Open. Original cause of wound was Pressure Injury. The wound is located on the Left Ischial Tuberosity. The wound measures 5cm length x 3.5cm width x 2cm depth; 13.744cm^2 area and 27.489cm^3 volume. The wound is limited to skin breakdown. There is no tunneling or undermining noted. There is a large amount of serous drainage noted. The wound margin is flat and intact. There is large (67- 100%) pink granulation within the wound bed. There is a small (1-33%) amount of necrotic tissue within the wound bed including Eschar and Adherent Slough. The periwound skin appearance exhibited: Moist, Erythema. The periwound skin appearance did not exhibit: Callus, Crepitus, Excoriation, Fluctuance, Friable, Induration, Localized Edema, Rash, Scarring, Dry/Scaly, Maceration, Atrophie Blanche, Cyanosis, Ecchymosis, Hemosiderin Staining, Mottled, Pallor, Rubor. The surrounding wound skin color is noted with erythema which is circumferential. [name], [name] 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 Cleanse wound with mild soap and water Wound #18 Left Ischial Tuberosity: Clean wound with Normal Saline. Cleanse wound with mild soap and water Anesthetic:  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,Posterior Amputation Site - Below Knee: Topical Lidocaine 4% cream applied to wound bed prior to debridement Wound #18 Left Ischial Tuberosity: Topical Lidocaine 4% cream applied to wound bed prior to debridement Primary Wound Dressing: 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,Posterior Amputation Site - Below Knee: Santyl Ointment - light gauze packing in left ischial tuberosity Wound #18 Left Ischial Tuberosity: Santyl Ointment - light gauze packing in left ischial tuberosity Secondary Dressing: 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,Posterior Amputation Site - Below Knee: Boardered Foam Dressing Wound #18 Left Ischial Tuberosity: Boardered Foam Dressing Dressing Change Frequency: 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 Wound #18 Left Ischial Tuberosity: Change dressing every day. Follow-up Appointments: [name], [name] ([id number]) 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,Posterior Amputation Site - Below Knee: Return Appointment in 2 weeks. Other: - as patient is able Wound #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: Return Appointment in 2 weeks. Other: - as patient is able Wound #18 Left Ischial Tuberosity: Return Appointment in 2 weeks. Other: - as patient is able Wound #9 Left Calcaneous: Return Appointment in 2 weeks. Other: - as patient is able Medications-please add to medication  list.: 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,Posterior Amputation Site - Below Knee: Santyl Enzymatic Ointment Wound #18 Left Ischial Tuberosity: Santyl Enzymatic Ointment #1 I'm going to continue the Santyl based dressings to the left greater trochanter and right popliteal wounds in the right greater trochanter will need to add gauze to fill in the dead space. #2 I did not look at the wound on her left lateral foot is had a VAC on and we didn't have any supplies to change it. If the home health nurse arranged by [facility] does not contact the patient's family tomorrow they are to recall our office and we'll arrange home health to change the [facility] #3 she has had another Integra placed over the left heel believe #4 she is on vancomycin and Ceptazidine again at dialysis that I arrange for her admission to hospital at [facility]. I am not exactly sure of the duration of treatment although it should be at least 2 weeks which would take her into next week. I am hopeful that Dr. Allena Katz as communicated with dialysis. #5 although use of hyperbaric oxygen might be appropriate in this setting I am not sure that the patient would actually be able to tolerated. A year ago, before I assumed her care she was able only to get through about 5 treatments. I think she was in better physical health at the time than now. I asked her mother and the patient to talk about this and to get back to [place]. Even if she was to go through this I would need documentation of where the chronic osteomyelitis is located. She certainly could not undergo dialysis and then hyperbaric oxygen, the hyperbaric oxygen treatments would have to be for before dialysis on dialysis days which are Monday Wednesday and Friday [name], [name] ([id number]) Electronic Signature(s) Signed: 07/03/2016 8:10:29 AM By: [name] Najjar MD Entered By: [name] on 06/24/2016 17:37:59 [name]  ([id number]) -------------------------------------------------------------------------------- SuperBill Details [name] Date of Service: 06/24/2016 Patient Name: N. Patient Account Number: [id number] Medical Record Treating RN: [name] [id number] Number: Other Clinician: 03-Apr-1981 (34 y.o. 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 I70.245 Atherosclerosis of  native arteries of left leg with ulceration of other part of foot I70.261 Atherosclerosis of native arteries of extremities with gangrene, right leg L89.622 Pressure ulcer of left heel, stage 2 Z89.511 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, right lower extremity S71.101A Unspecified open wound, right thigh, initial encounter L89.322 Pressure ulcer of left buttock, stage 2 Facility Procedures CPT4 Code: [id number] Description: 99213 - WOUND CARE VISIT-LEV 3 EST PT Modifier: Quantity: 1 Physician Procedures CPT4 Code: [id number] Description: 99213 - WC PHYS LEVEL 3 - EST PT ICD-10 Description Diagnosis E10.621 Type 1 diabetes mellitus with foot ulcer Modifier: Quantity: 1 Electronic Signature(s) Signed: 07/03/2016 8:10:29 AM By: [name] Najjarobson, Ragnar Waas MD Entered By: [name] Najjarobson, Carlitos Bottino on 06/24/2016 17:39:00

## 2016-07-03 NOTE — Progress Notes (Signed)
[name], [name] ([id number]) Visit Report for 06/24/2016 Arrival Information Details [name], [name] Date of Service: 06/24/2016 3:45 PM Patient Name: N. Patient Account Number: [id number] Medical Record Treating RN: [name] [id number] Number: Other Clinician: 01/26/1981 (34 y.o. [name], [name]) Physician/Extender: G Primary Care [name] Physician: Referring Physician: [name] Wu in Treatment: 43 Visit Information History Since Last Visit Added or deleted any medications: No Patient Arrived: Wheel Chair Any new allergies or adverse reactions: No Arrival Time: 16:05 Had a fall or experienced change in No activities of daily living that may affect Accompanied By: mother risk of falls: Transfer Assistance: None Signs or symptoms of abuse/neglect since last No Patient Identification Verified: Yes visito Secondary Verification Process Yes Hospitalized since last visit: No Completed: Pain Present Now: No Patient Requires Transmission-Based No Precautions: Patient Has Alerts: Yes Electronic Signature(s) Signed: 06/25/2016 4:40:32 PM By: [name] Entered By: [name] on 06/24/2016 16:05:48 [name] ([id number]) -------------------------------------------------------------------------------- Clinic Level of Care Assessment Details [name] Date of Service: 06/24/2016 3:45 PM Patient Name: N. Patient Account Number: [id number] Medical Record Treating RN: [name] [id number] Number: Other Clinician: Mar 17, 1981 (34 y.o. [name], [name]) Physician/Extender: G Primary Care [name] Physician: Referring Physician: [name] Wu in Treatment: 21 Clinic Level of Care Assessment Items TOOL 4 Quantity Score []  - Use when only an EandM is performed on FOLLOW-UP visit 0 ASSESSMENTS - Nursing Assessment / Reassessment X - Reassessment  of Co-morbidities (includes updates in patient status) 1 10 X - Reassessment of Adherence to Treatment Plan 1 5 ASSESSMENTS - Wound and Skin Assessment / Reassessment []  - Simple Wound Assessment / Reassessment - one wound 0 X - Complex Wound Assessment / Reassessment - multiple wounds 2 5 []  - Dermatologic / Skin Assessment (not related to wound area) 0 ASSESSMENTS - Focused Assessment []  - Circumferential Edema Measurements - multi extremities 0 []  - Nutritional Assessment / Counseling / Intervention 0 []  - Lower Extremity Assessment (monofilament, tuning fork, pulses) 0 []  - Peripheral Arterial Disease Assessment (using hand held doppler) 0 ASSESSMENTS - Ostomy and/or Continence Assessment and Care []  - Incontinence Assessment and Management 0 []  - Ostomy Care Assessment and Management (repouching, etc.) 0 PROCESS - Coordination of Care X - Simple Patient / Family Education for ongoing care 1 15 []  - Complex (extensive) Patient / Family Education for ongoing care 0 []  - Staff obtains Consents, Records, Test Results / Process Orders 0 LOA, IDLER ([id number]) []  - Staff telephones HHA, Nursing Homes / Clarify orders / etc 0 []  - Routine Transfer to another Facility (non-emergent condition) 0 []  - Routine Hospital Admission (non-emergent condition) 0 []  - New Admissions / Manufacturing engineer / Ordering NPWT, Apligraf, etc. 0 []  - Emergency Hospital Admission (emergent condition) 0 X - Simple Discharge Coordination 1 10 []  - Complex (extensive) Discharge Coordination 0 PROCESS - Special Needs []  - Pediatric / Minor Patient Management 0 []  - Isolation Patient Management 0 []  - Hearing / Language / Visual special needs 0 []  - Assessment of Community assistance (transportation, D/C planning, etc.) 0 []  - Additional assistance / Altered mentation 0 []  - Support Surface(s) Assessment (bed, cushion, seat, etc.) 0 INTERVENTIONS - Wound Cleansing / Measurement []  - Simple  Wound Cleansing - one wound 0 X - Complex Wound Cleansing - multiple wounds 2 5 X - Wound Imaging (photographs - any number of wounds) 1 5 []  - Wound Tracing (instead of photographs)  0 []  - Simple Wound Measurement - one wound 0 X - Complex Wound Measurement - multiple wounds 2 5 INTERVENTIONS - Wound Dressings []  - Small Wound Dressing one or multiple wounds 0 X - Medium Wound Dressing one or multiple wounds 2 15 []  - Large Wound Dressing one or multiple wounds 0 X - Application of Medications - topical 1 5 [name], [name] ([id number]) []  - Application of Medications - injection 0 INTERVENTIONS - Miscellaneous []  - External ear exam 0 []  - Specimen Collection (cultures, biopsies, blood, body fluids, etc.) 0 []  - Specimen(s) / Culture(s) sent or taken to Lab for analysis 0 []  - Patient Transfer (multiple staff / Michiel Wu Lift / Similar devices) 0 []  - Simple Staple / Suture removal (25 or less) 0 []  - Complex Staple / Suture removal (26 or more) 0 []  - Hypo / Hyperglycemic Management (close monitor of Blood Glucose) 0 []  - Ankle / Brachial Index (ABI) - do not check if billed separately 0 X - Vital Signs 1 5 Has the patient been seen at the hospital within the last three years: Yes Total Score: 115 Level Of Care: New/Established - Level 3 Electronic Signature(s) Signed: 06/25/2016 4:40:32 PM By: [name] Entered By: [name] on 06/24/2016 17:33:59 [name] ([id number]) -------------------------------------------------------------------------------- Encounter Discharge Information Details [name] Date of Service: 06/24/2016 3:45 PM Patient Name: N. Patient Account Number: [id number] Medical Record Treating RN: [name] [id number] Number: Other Clinician: 26-Jul-1981 (34 y.o. [name], [name]) Physician/Extender: G Primary Care [name] Physician: Referring Physician: [name] Wu in  Treatment: 35 Encounter Discharge Information Items Discharge Pain Level: 0 Discharge Condition: Stable Ambulatory Status: Wheelchair Discharge Destination: Home Transportation: Private Auto Accompanied By: mom Schedule Follow-up Appointment: Yes Medication Reconciliation completed and provided to Patient/Care No [name]: Provided on Clinical Summary of Care: 06/24/2016 Form Type Recipient Paper Patient HB Electronic Signature(s) Signed: 06/24/2016 5:35:03 PM By: [name] Previous Signature: 06/24/2016 4:55:04 PM Version By: Gwenlyn Perking Entered By: [name] on 06/24/2016 17:35:03 [name] ([id number]) -------------------------------------------------------------------------------- Multi Wound Chart Details [name] Date of Service: 06/24/2016 3:45 PM Patient Name: N. Patient Account Number: [id number] Medical Record Treating RN: [name] [id number] Number: Other Clinician: 07-30-1981 (34 y.o. 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 N/A Below Knee - Posterior Wounding Event: Pressure Injury Pressure Injury N/A Primary Etiology: Pressure Ulcer Pressure Ulcer N/A Comorbid History: Anemia, Type I Diabetes, Anemia, Type I Diabetes, N/A End Stage Renal Disease, End Stage Renal Disease, Rheumatoid Arthritis, Rheumatoid Arthritis, Neuropathy Neuropathy Date Acquired: 04/02/2016 05/12/2016 N/A Weeks of Treatment: 11 3 N/A Wound Status: Open Open N/A Measurements L x W x D 3.4x2.4x0.4 5x3.5x2 N/A (cm) Area (cm) : 6.409 13.744 N/A Volume (cm) : 2.564  27.489 N/A % Reduction in Area: -7.90% -2.30% N/A % Reduction in Volume: -331.60% -1946.80% N/A Classification: Category/Stage III Category/Stage II N/A HBO Classification: Grade 1 N/A N/A Radde, Enolia N. ([id number]) Exudate Amount: Large Large N/A Exudate Type: Serosanguineous Serous N/A Exudate Color: red, brown amber N/A Wound Margin: Distinct, outline attached Flat and Intact N/A Granulation Amount: Medium (34-66%) Large (67-100%) N/A Granulation Quality: Red, Pink Pink N/A Necrotic Amount: Medium (34-66%) Small (1-33%) N/A  Necrotic Tissue: Eschar, Adherent Slough Eschar, Adherent Slough N/A Exposed Structures: Fat: Yes Fascia: No N/A Fat: No Tendon: No Muscle: No Joint: No Bone: No Limited to Skin Breakdown Epithelialization: None Small (1-33%) N/A Periwound Skin Texture: Edema: Yes Edema: No N/A Excoriation: No Excoriation: No Induration: No Induration: No Callus: No Callus: No Crepitus: No Crepitus: No Fluctuance: No Fluctuance: No Friable: No Friable: No Rash: No Rash: No Scarring: No Scarring: No Periwound Skin Maceration: No Moist: Yes N/A Moisture: Moist: No Maceration: No Dry/Scaly: No Dry/Scaly: No Periwound Skin Color: Erythema: Yes Erythema: Yes N/A Atrophie Blanche: No Atrophie Blanche: No Cyanosis: No Cyanosis: No Ecchymosis: No Ecchymosis: No Hemosiderin Staining: No Hemosiderin Staining: No Mottled: No Mottled: No Pallor: No Pallor: No Rubor: No Rubor: No Erythema Location: Circumferential Circumferential N/A Erythema Change: No Change N/A N/A Temperature: No Abnormality N/A N/A Tenderness on Yes No N/A Palpation: Wound Preparation: Ulcer Cleansing: Ulcer Cleansing: N/A Rinsed/Irrigated with Rinsed/Irrigated with Saline Saline Topical Anesthetic Topical Anesthetic Applied: Other: lidocaine Applied: Other: lidocaine 4% 4% SHEYENNE, KONZ ([id number]) Treatment Notes Electronic Signature(s) Signed: 06/24/2016  5:33:23 PM By: [name] Entered By: [name] on 06/24/2016 17:33:23 [name] ([id number]) -------------------------------------------------------------------------------- Multi-Disciplinary Care Plan Details [name] Date of Service: 06/24/2016 3:45 PM Patient Name: N. Patient Account Number: [id number] Medical Record Treating RN: [name] [id number] Number: Other Clinician: 07/29/1981 (34 y.o. [name], [name]) Physician/Extender: G Primary Care [name] Physician: Referring Physician: [name] Wu in Treatment: 45 Active Inactive HBO Nursing Diagnoses: Anxiety related to feelings of confinement associated with the hyperbaric oxygen chamber Anxiety related to knowledge deficit of hyperbaric oxygen therapy and treatment procedures Discomfort related to temperature and humidity changes inside hyperbaric chamber Potential for barotraumas to ears, sinuses, teeth, and lungs or cerebral gas embolism related to changes in atmospheric pressure inside hyperbaric oxygen chamber Potential for oxygen toxicity seizures related to delivery of 100% oxygen at an increased atmospheric pressure Potential for pulmonary oxygen toxicity related to delivery of 100% oxygen at an increased atmospheric pressure Goals: Barotrauma will be prevented during HBO2 Date Initiated: 08/30/2015 Goal Status: Active Patient and/or family will be able to state/discuss factors appropriate to the management of their disease process during treatment Date Initiated: 08/30/2015 Goal Status: Active Patient will tolerate the hyperbaric oxygen therapy treatment Date Initiated: 08/30/2015 Goal Status: Active Patient will tolerate the internal climate of the chamber Date Initiated: 08/30/2015 Goal Status: Active Patient/caregiver will verbalize understanding of HBO goals, rationale, procedures and potential hazards Date Initiated:  08/30/2015 Goal Status: Active Signs and symptoms of pulmonary oxygen toxicity will be recognized and promptly addressed Date Initiated: 08/30/2015 [name] ([id number]) Goal Status: Active Signs and symptoms of seizure will be recognized and promptly addressed ; seizing patients will suffer no harm Date Initiated: 08/30/2015 Goal Status: Active Interventions: Administer a five (5) minute air break for patient if signs and symptoms of seizure appear and notify the hyperbaric physician Administer a ten (10) minute air break for patient if signs and symptoms of seizure appear and notify the hyperbaric physician Administer decongestants, per physician orders, prior to HBO2 Administer the correct therapeutic gas delivery based on the patients needs and limitations, per physician order Assess and provide for patientos comfort related to the hyperbaric environment and equalization of middle ear Assess for signs and symptoms related to adverse events, including but not limited to confinement anxiety, pneumothorax, oxygen toxicity and baurotrauma Assess patient for any history of confinement anxiety Assess  patient's knowledge and expectations regarding hyperbaric medicine and provide education related to the hyperbaric environment, goals of treatment and prevention of adverse events Implement protocols to decrease risk of pneumothorax in high risk patients Notes: Abuse / Safety / Falls / Self Care Management Nursing Diagnoses: Potential for falls Goals: Patient will remain injury free Date Initiated: 09/19/2015 Goal Status: Active Patient/caregiver will verbalize/demonstrate measures taken to prevent injury and/or falls Date Initiated: 09/19/2015 Goal Status: Active Interventions: Assess fall risk on admission and as needed Assess impairment of mobility on admission and as needed per policy Notes: Orientation to the Wound Care Program Nursing Diagnoses: [name], [name]  N. ([id number]) Knowledge deficit related to the wound healing center program Goals: Patient/caregiver will verbalize understanding of the Wound Healing Center Program Date Initiated: 08/30/2015 Goal Status: Active Interventions: Provide education on orientation to the wound center Notes: Pressure Nursing Diagnoses: Knowledge deficit related to causes and risk factors for pressure ulcer development Knowledge deficit related to management of pressures ulcers Potential for impaired tissue integrity related to pressure, friction, moisture, and shear Goals: Patient will remain free from development of additional pressure ulcers Date Initiated: 08/30/2015 Goal Status: Active Patient will remain free of pressure ulcers Date Initiated: 08/30/2015 Goal Status: Active Patient/caregiver will verbalize risk factors for pressure ulcer development Date Initiated: 08/30/2015 Goal Status: Active Patient/caregiver will verbalize understanding of pressure ulcer management Date Initiated: 08/30/2015 Goal Status: Active Interventions: Assess: immobility, friction, shearing, incontinence upon admission and as needed Assess offloading mechanisms upon admission and as needed Assess potential for pressure ulcer upon admission and as needed Provide education on pressure ulcers Treatment Activities: Patient referred for home evaluation of offloading devices/mattresses : 01/15/2016 Patient referred for pressure reduction/relief devices : 01/15/2016 Pressure reduction/relief device ordered : 01/15/2016 Notes: [name], [name] ([id number]) Wound/Skin Impairment Nursing Diagnoses: Impaired tissue integrity Knowledge deficit related to ulceration/compromised skin integrity Goals: Patient will have a decrease in wound volume by X% from date: (specify in notes) Date Initiated: 08/30/2015 Goal Status: Active Patient/caregiver will verbalize understanding of skin care regimen Date Initiated: 08/30/2015 Goal  Status: Active Ulcer/skin breakdown will have a volume reduction of 30% by week 4 Date Initiated: 08/30/2015 Goal Status: Active Ulcer/skin breakdown will have a volume reduction of 50% by week 8 Date Initiated: 08/30/2015 Goal Status: Active Ulcer/skin breakdown will have a volume reduction of 80% by week 12 Date Initiated: 08/30/2015 Goal Status: Active Ulcer/skin breakdown will heal within 14 weeks Date Initiated: 08/30/2015 Goal Status: Active Interventions: Assess patient/caregiver ability to obtain necessary supplies Assess patient/caregiver ability to perform ulcer/skin care regimen upon admission and as needed Assess ulceration(s) every visit Provide education on ulcer and skin care Notes: Electronic Signature(s) Signed: 06/24/2016 5:33:16 PM By: [name] Sitesorthy, Joanna Entered By: [name] Sitesorthy, Joanna on 06/24/2016 17:33:16 [name], [name] ([id number]) -------------------------------------------------------------------------------- Pain Assessment Details [name] PayorBLACKBURN, Keyli Date of Service: 06/24/2016 3:45 PM Patient Name: N. Patient Account Number: [id number] Medical Record Treating RN: [name] SitesDorthy, Joanna [id number] Number: Other Clinician: 12-17-1980 (34 y.o. [name], [name]) Physician/Extender: G Primary Care [name] Barrylmedo, Mario Physician: Referring Physician: [name] Partridgelmedo, Mario Weeks in Treatment: 5442 Active Problems Location of Pain Severity and Description of Pain Patient Has Paino No Site Locations Pain Management and Medication Current Pain Management: Notes Topical or injectable lidocaine is offered to patient for acute pain when surgical debridement is performed. If needed, Patient is instructed to use over the counter pain medication for the following 24-48 hours after debridement. Wound care MDs do not prescribed pain medications. Patient has  chronic pain or uncontrolled pain. Patient has been instructed to make an appointment with  their Primary Care Physician for pain management. Electronic Signature(s) Signed: 06/25/2016 4:40:32 PM By: [name] Entered By: [name] on 06/24/2016 16:06:06 [name] ([id number]) -------------------------------------------------------------------------------- Patient/Caregiver Education Details [name] Date of Service: 06/24/2016 3:45 PM Patient Name: N. Patient Account Number: [id number] Medical Record Treating RN: [name] [id number] Number: Other Clinician: Jan 20, 1981 (34 y.o. [name], [name] Date of Birth/Gender: Wu) Physician/Extender: G Primary Care Weeks in Treatment: 32 [name] Physician: Referring Physician: Rolin Wu Education Assessment Education Provided To: Patient and Caregiver Education Topics Provided Hyperbaric Oxygenation: Handouts: Other: purpose of HBOT Methods: Explain/Verbal Responses: State content correctly Electronic Signature(s) Signed: 06/25/2016 4:40:32 PM By: [name] Entered By: [name] on 06/24/2016 17:35:19 [name] ([id number]) -------------------------------------------------------------------------------- Wound Assessment Details [name] Date of Service: 06/24/2016 3:45 PM Patient Name: N. Patient Account Number: [id number] Medical Record Treating RN: [name] [id number] Number: Other Clinician: [birth year] (34 y.o. 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 Wound Open Status: Wounding Event: Pressure Injury Comorbid Anemia, Type I Diabetes, End Stage Date Acquired: 04/02/2016 History: Renal Disease, Rheumatoid Arthritis, Weeks Of Treatment: 11 Neuropathy Clustered Wound:  No Photos Wound Measurements Length: (cm) 3.4 Width: (cm) 2.4 Depth: (cm) 0.4 Area: (cm) 6.409 Volume: (cm) 2.564 % Reduction in Area: -7.9% % Reduction in Volume: -331.6% Epithelialization: None Tunneling: No Undermining: No Wound Description Classification: Category/Stage III Diabetic Severity [address]): Grade 1 Wound Margin: Distinct, outline attach Exudate Amount: Large Exudate Type: Serosanguineous Exudate Color: red, brown Foul Odor After Cleansing: No ed Wound Bed Dao, Bernese N. ([id number]) Granulation Amount: Medium (34-66%) Exposed Structure Granulation Quality: Red, Pink Fat Layer Exposed: Yes Necrotic Amount: Medium (34-66%) Necrotic Quality: Eschar, Adherent Slough Periwound Skin Texture Texture Color No Abnormalities Noted: No No Abnormalities Noted: No Callus: No Atrophie Blanche: No Crepitus: No Cyanosis: No Excoriation: No Ecchymosis: No Fluctuance: No Erythema: Yes Friable: No Erythema Location: Circumferential Induration: No Erythema Change: No Change Localized Edema: Yes Hemosiderin Staining: No Rash: No Mottled: No Scarring: No Pallor: No Rubor: No Moisture No Abnormalities Noted: No Temperature / Pain Dry / Scaly: No Temperature: No Abnormality Maceration: No Tenderness on Palpation: Yes Moist: No Wound Preparation Ulcer Cleansing: Rinsed/Irrigated with Saline Topical Anesthetic Applied: Other: lidocaine 4%, Treatment Notes 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 Cleansed with: Clean wound with Normal Saline 2. Anesthetic Topical Lidocaine 4% cream to wound bed prior to debridement 4. Dressing Applied: Santyl Ointment 5. Secondary Dressing Applied Bordered Foam Dressing Dry Gauze Electronic Signature(s) Signed: 06/25/2016 4:40:32 PM By: [name] Entered By: [name] on 06/24/2016 16:41:44 [name]  ([id number]) -------------------------------------------------------------------------------- Wound Assessment Details [name] Date of Service: 06/24/2016 3:45 PM Patient Name: N. Patient Account Number: [id number] Medical Record Treating RN: [name] [id number] Number: Other Clinician: Jan 24, 1981 (34 y.o. [name], [name]) Physician/Extender: G Primary Care [name] Physician: Referring Physician: [name] Wu in Treatment: 42 Wound Status Wound Number: 18 Primary Pressure Ulcer Etiology: Wound Location: Left Ischial Tuberosity Wound Open Wounding Event: Pressure Injury Status: Date Acquired: 05/12/2016 Comorbid Anemia, Type I Diabetes, End Stage Weeks Of Treatment: 3 History: Renal Disease, Rheumatoid Arthritis, Clustered Wound: No Neuropathy Photos Wound Measurements Length: (cm)  5 Width: (cm) 3.5 Depth: (cm) 2 Area: (cm) 13.744 Volume: (cm) 27.489 % Reduction in Area: -2.3% % Reduction in Volume: -1946.8% Epithelialization: Small (1-33%) Tunneling: No Undermining: No Wound Description Classification: Category/Stage II Wound Margin: Flat and Intact Exudate Amount: Large Exudate Type: Serous Exudate Color: amber Wound Bed Granulation Amount: Large (67-100%) Exposed Structure Hoying, Suzann N. ([id number]) Granulation Quality: Pink Fascia Exposed: No Necrotic Amount: Small (1-33%) Fat Layer Exposed: No Necrotic Quality: Eschar, Adherent Slough Tendon Exposed: No Muscle Exposed: No Joint Exposed: No Bone Exposed: No Limited to Skin Breakdown Periwound Skin Texture Texture Color No Abnormalities Noted: No No Abnormalities Noted: No Callus: No Atrophie Blanche: No Crepitus: No Cyanosis: No Excoriation: No Ecchymosis: No Fluctuance: No Erythema: Yes Friable: No Erythema Location: Circumferential Induration: No Hemosiderin Staining: No Localized Edema: No Mottled: No Rash:  No Pallor: No Scarring: No Rubor: No Moisture No Abnormalities Noted: No Dry / Scaly: No Maceration: No Moist: Yes Wound Preparation Ulcer Cleansing: Rinsed/Irrigated with Saline Topical Anesthetic Applied: Other: lidocaine 4%, Treatment Notes Wound #18 (Left Ischial Tuberosity) 1. Cleansed with: Clean wound with Normal Saline 2. Anesthetic Topical Lidocaine 4% cream to wound bed prior to debridement 4. Dressing Applied: Santyl Ointment 5. Secondary Dressing Applied Bordered Foam Dressing Dry Gauze Electronic Signature(s) Signed: 06/25/2016 4:40:32 PM By: [name] Entered By: [name] on 06/24/2016 16:42:17 [name] ([id number]) Simeon Craft, Ledell Peoples ([id number]) -------------------------------------------------------------------------------- Vitals Details [name] Date of Service: 06/24/2016 3:45 PM Patient Name: N. Patient Account Number: [id number] Medical Record Treating RN: [name] [id number] Number: Other Clinician: [birth year] (34 y.o. [name], [name]) Physician/Extender: G Primary Care [name] Physician: Referring Physician: [name] Wu in Treatment: 77 Vital Signs Time Taken: 16:06 Pulse (bpm): 68 Height (in): 68 Respiratory Rate (breaths/min): 16 Weight (lbs): 96 Blood Pressure (mmHg): 129/70 Body Mass Index (BMI): 14.6 Reference Range: 80 - 120 mg / dl Electronic Signature(s) Signed: 06/25/2016 4:40:32 PM By: [name] Entered By: [name] on 06/24/2016 16:08:17

## 2016-07-08 ENCOUNTER — Ambulatory Visit: Payer: Medicare Other | Admitting: Physician Assistant

## 2016-07-15 ENCOUNTER — Encounter: Payer: Medicare Other | Attending: Internal Medicine | Admitting: Internal Medicine

## 2016-07-15 DIAGNOSIS — Z992 Dependence on renal dialysis: Secondary | ICD-10-CM | POA: Insufficient documentation

## 2016-07-15 DIAGNOSIS — L89322 Pressure ulcer of left buttock, stage 2: Secondary | ICD-10-CM | POA: Insufficient documentation

## 2016-07-15 DIAGNOSIS — E43 Unspecified severe protein-calorie malnutrition: Secondary | ICD-10-CM | POA: Diagnosis not present

## 2016-07-15 DIAGNOSIS — I70261 Atherosclerosis of native arteries of extremities with gangrene, right leg: Secondary | ICD-10-CM | POA: Diagnosis not present

## 2016-07-15 DIAGNOSIS — E1022 Type 1 diabetes mellitus with diabetic chronic kidney disease: Secondary | ICD-10-CM | POA: Insufficient documentation

## 2016-07-15 DIAGNOSIS — N186 End stage renal disease: Secondary | ICD-10-CM | POA: Insufficient documentation

## 2016-07-15 DIAGNOSIS — T87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, right lower extremity: Secondary | ICD-10-CM | POA: Diagnosis not present

## 2016-07-15 DIAGNOSIS — I70245 Atherosclerosis of native arteries of left leg with ulceration of other part of foot: Secondary | ICD-10-CM | POA: Diagnosis not present

## 2016-07-15 DIAGNOSIS — Z89511 Acquired absence of right leg below knee: Secondary | ICD-10-CM | POA: Diagnosis not present

## 2016-07-15 DIAGNOSIS — X58XXXA Exposure to other specified factors, initial encounter: Secondary | ICD-10-CM | POA: Insufficient documentation

## 2016-07-15 DIAGNOSIS — L89622 Pressure ulcer of left heel, stage 2: Secondary | ICD-10-CM | POA: Diagnosis not present

## 2016-07-15 DIAGNOSIS — E10621 Type 1 diabetes mellitus with foot ulcer: Secondary | ICD-10-CM | POA: Insufficient documentation

## 2016-07-15 DIAGNOSIS — S71101A Unspecified open wound, right thigh, initial encounter: Secondary | ICD-10-CM | POA: Insufficient documentation

## 2016-07-15 DIAGNOSIS — E1052 Type 1 diabetes mellitus with diabetic peripheral angiopathy with gangrene: Secondary | ICD-10-CM | POA: Insufficient documentation

## 2016-07-16 NOTE — Progress Notes (Signed)
[name], [name] ([id number]) Visit Report for 07/15/2016 Arrival Information Details [name], [name] Date of Service: 07/15/2016 3:45 PM Patient Name: N. Patient Account Number: [id number] Medical Record Treating RN: [name] [id number] Number: Other Clinician: 1981-03-26 (35 y.o. Treating [name], [name] Date of Birth/Sex: Female) Physician/Extender: G Primary Care [name] Physician: Referring Physician: [name] in Treatment: 45 Visit Information History Since Last Visit Added or deleted any medications: No Patient Arrived: Wheel Chair Any new allergies or adverse reactions: No Arrival Time: 16:13 Had a fall or experienced change in No activities of daily living that may affect Accompanied By: mom risk of falls: Transfer Assistance: None Signs or symptoms of abuse/neglect since last No Patient Identification Verified: Yes visito Secondary Verification Process Yes Hospitalized since last visit: No Completed: Pain Present Now: No Patient Requires Transmission-Based No Precautions: Patient Has Alerts: Yes Electronic Signature(s) Signed: 07/15/2016 5:13:45 PM By: [name] Entered By: [name] on 07/15/2016 16:13:24 [name] ([id number]) -------------------------------------------------------------------------------- Encounter Discharge Information Details [name] Date of Service: 07/15/2016 3:45 PM Patient Name: N. Patient Account Number: [id number] Medical Record Treating RN: [name] [id number] Number: Other Clinician: [birth year] (35 y.o. Treating [name], [name] Date of Birth/Sex: Female) Physician/Extender: G Primary Care [name] Physician: Referring Physician: [name] in Treatment: 74 Encounter Discharge Information Items Discharge Pain Level: 0 Discharge Condition: Stable Ambulatory Status: Wheelchair Discharge Destination: Home Transportation: Private  Auto Accompanied By: mom Schedule Follow-up Appointment: Yes Medication Reconciliation completed and provided to Patient/Care No Keison Glendinning: Provided on Clinical Summary of Care: 07/15/2016 Form Type Recipient Paper Patient hb Electronic Signature(s) Signed: 07/15/2016 5:09:38 PM By: Dayton Martes RCP, RRT, CHT Entered By: Dayton Martes on 07/15/2016 17:09:38 [name] ([id number]) -------------------------------------------------------------------------------- Multi Wound Chart Details [name] Date of Service: 07/15/2016 3:45 PM Patient Name: N. Patient Account Number: [id number] Medical Record Treating RN: [name] [id number] Number: Other Clinician: Jul 25, 1981 (35 y.o. 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] [17:Transmetatarsal] [18:Left Ischial Tuberosity] Wounding Event: [16:Pressure Injury] [17:Surgical Injury] [18:Pressure Injury] Primary Etiology: [16:Pressure Ulcer] [17:Open Surgical Wound] [18:Pressure Ulcer] Comorbid History: [16:Anemia, Type I Diabetes, Anemia, Type I Diabetes, Anemia, Type I Diabetes, End Stage Renal Disease, End Stage Renal Disease, End Stage Renal Disease, Rheumatoid Arthritis, Neuropathy] [17:Rheumatoid Arthritis, Neuropathy]  [18:Rheumatoid Arthritis, Neuropathy] Date Acquired: [16:04/02/2016] [17:04/13/2016] [18:05/12/2016] Weeks of Treatment: [16:14] [17:11] [18:6] Wound Status: [16:Open] [17:Open] [18:Open] Measurements L x W x D 2.5x2x0.3  [17:1x0.9x0.6] [18:3x2.4x2] (cm) Area (cm) : [16:3.927] [17:0.707] [18:5.655] Volume (cm) : [16:1.178] [17:0.424] [18:11.31] % Reduction in Area: [16:33.90%] [17:15.80%] [18:57.90%] % Reduction in Volume: -98.30% [17:-404.80%] [18:-742.10%] Starting Position 1 12 (o'clock): Ending Position 1 [16:5] (o'clock): Maximum Distance 1 0.5 (cm): Undermining: [16:Yes] [17:No] [18:No] Classification: [16:Category/Stage III] [17:Partial Thickness] [18:Category/Stage II] HBO Classification: Grade 1 Grade 1 N/A Exudate Amount: Large Medium Large Exudate Type: Serosanguineous Serous Serous Exudate Color: red, brown amber amber Wound Margin: Distinct, outline attached Flat and Intact Flat and Intact Granulation Amount: Medium (34-66%) Small (1-33%) Large (67-100%) Granulation Quality: Red, Pink Red Pink Necrotic Amount: Medium (34-66%) Large (67-100%) Small (1-33%) Necrotic Tissue: Eschar, Adherent Slough Adherent [employer], Adherent Slough Exposed Structures: Fat: Yes  Fascia: No Fascia: No Fat: No Fat: No Tendon: No Tendon: No Muscle: No Muscle: No Joint: No Joint: No Bone: No Bone: No Limited to Skin Limited to Skin Breakdown Breakdown Epithelialization: None None Small (1-33%) Periwound Skin Texture: Edema: Yes Edema: No Edema: No Excoriation: No Excoriation: No Excoriation: No Induration: No Induration: No Induration: No Callus: No Callus: No Callus: No Crepitus: No Crepitus: No Crepitus: No Fluctuance: No Fluctuance: No Fluctuance: No Friable: No Friable: No Friable: No Rash: No Rash: No Rash: No Scarring: No Scarring: No Scarring: No Periwound Skin Maceration: No Moist: Yes Moist: Yes Moisture: Moist: No Maceration: No Maceration: No Dry/Scaly: No Dry/Scaly: No Dry/Scaly: No Periwound Skin Color: Erythema: Yes Atrophie Blanche: No Erythema: Yes Atrophie Blanche: No Cyanosis: No Atrophie Blanche: No Cyanosis: No Ecchymosis: No Cyanosis:  No Ecchymosis: No Erythema: No Ecchymosis: No Hemosiderin Staining: No Hemosiderin Staining: No Hemosiderin Staining: No Mottled: No Mottled: No Mottled: No Pallor: No Pallor: No Pallor: No Rubor: No Rubor: No Rubor: No Erythema Location: Circumferential N/A Circumferential Erythema Change: No Change N/A N/A Temperature: No Abnormality N/A N/A Tenderness on Yes No No Palpation: Wound Preparation: Ulcer Cleansing: Ulcer Cleansing: Ulcer Cleansing: Rinsed/Irrigated with Rinsed/Irrigated with Rinsed/Irrigated with Saline Saline Saline Topical Anesthetic Topical Anesthetic Topical Anesthetic Applied: Other: lidocaine Applied: Other: lidocaine Applied: Other: lidocaine 4% 4% 4% [name], [name] ([id number]) Wound Number: 9 N/A N/A Photos: No Photos N/A N/A Wound Location: Left Calcaneous N/A N/A Wounding Event: Pressure Injury N/A N/A Primary Etiology: 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 Comorbid History: Anemia, Type I Diabetes, N/A N/A End Stage Renal Disease, Rheumatoid Arthritis, Neuropathy Date Acquired: 07/28/2015 N/A N/A Weeks of Treatment: 45 N/A N/A Wound Status: Open N/A N/A Measurements L x W x D 4.5x3.2x0.1 N/A N/A (cm) Area (cm) : 11.31 N/A N/A Volume (cm) : 1.131 N/A N/A % Reduction in Area: 39.10% N/A N/A % Reduction in Volume: 39.10% N/A N/A Undermining: No N/A N/A Classification: Grade 2 N/A N/A HBO Classification: N/A N/A N/A Exudate Amount: None Present N/A N/A Exudate Type: N/A N/A N/A Exudate Color: N/A N/A N/A Wound Margin: Flat and Intact N/A N/A Granulation Amount: Medium (34-66%) N/A N/A Granulation Quality: Red N/A N/A Necrotic Amount: Medium (34-66%) N/A N/A Necrotic Tissue: Eschar, Adherent Slough N/A N/A Exposed Structures: Fascia: No N/A N/A Fat: No Tendon: No Muscle: No Joint: No Bone: No Limited to Skin Breakdown Epithelialization: None N/A N/A Periwound Skin Texture: Edema: No N/A N/A Excoriation:  No Induration: No Callus: No Crepitus: No Fluctuance: No Friable: No Rash: No Scarring: No N/A N/A [name], [name] ([id number]) Periwound Skin Moist: Yes Moisture: Maceration: No Dry/Scaly: No Periwound Skin Color: Atrophie Blanche: No N/A N/A Cyanosis: No Ecchymosis: No Erythema: No Hemosiderin Staining: No Mottled: No Pallor: No Rubor: No Erythema Location: N/A N/A N/A Erythema Change: N/A N/A N/A Temperature: N/A N/A N/A Tenderness on No N/A N/A Palpation: Wound Preparation: Ulcer Cleansing: N/A N/A Rinsed/Irrigated with Saline Topical Anesthetic Applied: Other: lidocaine 4% Treatment Notes Electronic Signature(s) Signed: 07/15/2016 4:36:59 PM By: [name] Entered By: [name] on 07/15/2016 16:36:58 [name] ([id number]) -------------------------------------------------------------------------------- Multi-Disciplinary Care Plan Details [name] Date of Service: 07/15/2016 3:45 PM Patient Name: N. Patient Account Number: [id number] Medical Record Treating RN: [name] [id number] Number: Other Clinician: 22-Dec-1980 (35 y.o. Treating [name], [name] Date of Birth/Sex: Female) Physician/Extender: G Primary Care [name] Physician: Referring Physician: [name] in Treatment: 63 Active Inactive HBO Nursing Diagnoses: Anxiety related to  feelings of confinement associated with the hyperbaric oxygen chamber Anxiety related to knowledge deficit of hyperbaric oxygen therapy and treatment procedures Discomfort related to temperature and humidity changes inside hyperbaric chamber Potential for barotraumas to ears, sinuses, teeth, and lungs or cerebral gas embolism related to changes in atmospheric pressure inside hyperbaric oxygen chamber Potential for oxygen toxicity seizures related to delivery of 100% oxygen at an increased atmospheric pressure Potential for pulmonary oxygen toxicity related to  delivery of 100% oxygen at an increased atmospheric pressure Goals: Barotrauma will be prevented during HBO2 Date Initiated: 08/30/2015 Goal Status: Active Patient and/or family will be able to state/discuss factors appropriate to the management of their disease process during treatment Date Initiated: 08/30/2015 Goal Status: Active Patient will tolerate the hyperbaric oxygen therapy treatment Date Initiated: 08/30/2015 Goal Status: Active Patient will tolerate the internal climate of the chamber Date Initiated: 08/30/2015 Goal Status: Active Patient/caregiver will verbalize understanding of HBO goals, rationale, procedures and potential hazards Date Initiated: 08/30/2015 Goal Status: Active Signs and symptoms of pulmonary oxygen toxicity will be recognized and promptly addressed Date Initiated: 08/30/2015 [name] ([id number]) Goal Status: Active Signs and symptoms of seizure will be recognized and promptly addressed ; seizing patients will suffer no harm Date Initiated: 08/30/2015 Goal Status: Active Interventions: Administer a five (5) minute air break for patient if signs and symptoms of seizure appear and notify the hyperbaric physician Administer a ten (10) minute air break for patient if signs and symptoms of seizure appear and notify the hyperbaric physician Administer decongestants, per physician orders, prior to HBO2 Administer the correct therapeutic gas delivery based on the patients needs and limitations, per physician order Assess and provide for patientos comfort related to the hyperbaric environment and equalization of middle ear Assess for signs and symptoms related to adverse events, including but not limited to confinement anxiety, pneumothorax, oxygen toxicity and baurotrauma Assess patient for any history of confinement anxiety Assess patient's knowledge and expectations regarding hyperbaric medicine and provide education related to the hyperbaric  environment, goals of treatment and prevention of adverse events Implement protocols to decrease risk of pneumothorax in high risk patients Notes: Abuse / Safety / Falls / Self Care Management Nursing Diagnoses: Potential for falls Goals: Patient will remain injury free Date Initiated: 09/19/2015 Goal Status: Active Patient/caregiver will verbalize/demonstrate measures taken to prevent injury and/or falls Date Initiated: 09/19/2015 Goal Status: Active Interventions: Assess fall risk on admission and as needed Assess impairment of mobility on admission and as needed per policy Notes: Orientation to the Wound Care Program Nursing Diagnoses: [name], [name] ([id number]) Knowledge deficit related to the wound healing center program Goals: Patient/caregiver will verbalize understanding of the Wound Healing Center Program Date Initiated: 08/30/2015 Goal Status: Active Interventions: Provide education on orientation to the wound center Notes: Pressure Nursing Diagnoses: Knowledge deficit related to causes and risk factors for pressure ulcer development Knowledge deficit related to management of pressures ulcers Potential for impaired tissue integrity related to pressure, friction, moisture, and shear Goals: Patient will remain free from development of additional pressure ulcers Date Initiated: 08/30/2015 Goal Status: Active Patient will remain free of pressure ulcers Date Initiated: 08/30/2015 Goal Status: Active Patient/caregiver will verbalize risk factors for pressure ulcer development Date Initiated: 08/30/2015 Goal Status: Active Patient/caregiver will verbalize understanding of pressure ulcer management Date Initiated: 08/30/2015 Goal Status: Active Interventions: Assess: immobility, friction, shearing, incontinence upon admission and as needed Assess offloading mechanisms upon admission and as needed Assess potential for pressure ulcer upon admission and  as  needed Provide education on pressure ulcers Treatment Activities: Patient referred for home evaluation of offloading devices/mattresses : 01/15/2016 Patient referred for pressure reduction/relief devices : 01/15/2016 Pressure reduction/relief device ordered : 01/15/2016 Notes: [name], [name] ([id number]) Wound/Skin Impairment Nursing Diagnoses: Impaired tissue integrity Knowledge deficit related to ulceration/compromised skin integrity Goals: Patient will have a decrease in wound volume by X% from date: (specify in notes) Date Initiated: 08/30/2015 Goal Status: Active Patient/caregiver will verbalize understanding of skin care regimen Date Initiated: 08/30/2015 Goal Status: Active Ulcer/skin breakdown will have a volume reduction of 30% by week 4 Date Initiated: 08/30/2015 Goal Status: Active Ulcer/skin breakdown will have a volume reduction of 50% by week 8 Date Initiated: 08/30/2015 Goal Status: Active Ulcer/skin breakdown will have a volume reduction of 80% by week 12 Date Initiated: 08/30/2015 Goal Status: Active Ulcer/skin breakdown will heal within 14 weeks Date Initiated: 08/30/2015 Goal Status: Active Interventions: Assess patient/caregiver ability to obtain necessary supplies Assess patient/caregiver ability to perform ulcer/skin care regimen upon admission and as needed Assess ulceration(s) every visit Provide education on ulcer and skin care Notes: Electronic Signature(s) Signed: 07/15/2016 4:36:45 PM By: [name] Entered By: [name] on 07/15/2016 16:36:44 [name] ([id number]) -------------------------------------------------------------------------------- Pain Assessment Details [name] Date of Service: 07/15/2016 3:45 PM Patient Name: N. Patient Account Number: [id number] Medical Record Treating RN: [name] [id number] Number: Other Clinician: 1981/09/14 (35 y.o. Treating [name], [name] Date of Birth/Sex: Female)  Physician/Extender: G Primary Care [name] Physician: Referring Physician: [name] in Treatment: 76 Active Problems Location of Pain Severity and Description of Pain Patient Has Paino No Site Locations Pain Management and Medication Current Pain Management: Notes Topical or injectable lidocaine is offered to patient for acute pain when surgical debridement is performed. If needed, Patient is instructed to use over the counter pain medication for the following 24-48 hours after debridement. Wound care MDs do not prescribed pain medications. Patient has chronic pain or uncontrolled pain. Patient has been instructed to make an appointment with their Primary Care Physician for pain management. Electronic Signature(s) Signed: 07/15/2016 5:13:45 PM By: [name] Entered By: [name] on 07/15/2016 16:13:31 [name] ([id number]) -------------------------------------------------------------------------------- Patient/Caregiver Education Details [name] Date of Service: 07/15/2016 3:45 PM Patient Name: N. Patient Account Number: [id number] Medical Record Treating RN: [name] [id number] Number: Other Clinician: 08-06-1981 (35 y.o. Treating [name], [name] Date of Birth/Gender: Female) Physician/Extender: G Primary Care Weeks in Treatment: 46 [name] Physician: Referring Physician: [name] Education Assessment Education Provided To: Patient and Caregiver Education Topics Provided Wound/Skin Impairment: Handouts: Other: wound care as ordered Methods: Demonstration, Explain/Verbal Responses: State content correctly Electronic Signature(s) Signed: 07/15/2016 5:13:45 PM By: [name] Entered By: [name] on 07/15/2016 17:00:03 [name] ([id number]) -------------------------------------------------------------------------------- Wound Assessment Details [name] Date of Service:  07/15/2016 3:45 PM Patient Name: N. Patient Account Number: [id number] Medical Record Treating RN: [name] [id number] Number: Other Clinician: September 01, 1981 (35 y.o. 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 Wound Open Status: Wounding Event: Pressure Injury Comorbid Anemia, Type I Diabetes, End Stage Date Acquired: 04/02/2016 History: Renal Disease, Rheumatoid Arthritis, Weeks Of Treatment: 14 Neuropathy Clustered Wound: No Photos Wound Measurements Length: (cm) 2.5 Width: (cm) 2 Depth: (cm) 0.3 Area: (cm) 3.927 Volume: (cm) 1.178 % Reduction in Area:  33.9% % Reduction in Volume: -98.3% Epithelialization: None Tunneling: No Undermining: Yes Starting Position (o'clock): 12 Ending Position (o'clock): 5 Maximum Distance: (cm) 0.5 Wound Description Classification: Category/Stage III Diabetic Severity [name]): Grade 1 Wound Margin: Distinct, outline attached [name], [name] ([id number]) Foul Odor After Cleansing: No Exudate Amount: Large Exudate Type: Serosanguineous Exudate Color: red, brown Wound Bed Granulation Amount: Medium (34-66%) Exposed Structure Granulation Quality: Red, Pink Fat Layer Exposed: Yes Necrotic Amount: Medium (34-66%) Necrotic Quality: Eschar, Adherent Slough Periwound Skin Texture Texture Color No Abnormalities Noted: No No Abnormalities Noted: No Callus: No Atrophie Blanche: No Crepitus: No Cyanosis: No Excoriation: No Ecchymosis: No Fluctuance: No Erythema: Yes Friable: No Erythema Location: Circumferential Induration: No Erythema Change: No Change Localized Edema: Yes Hemosiderin Staining: No Rash: No Mottled: No Scarring: No Pallor: No Rubor:  No Moisture No Abnormalities Noted: No Temperature / Pain Dry / Scaly: No Temperature: No Abnormality Maceration: No Tenderness on Palpation: Yes Moist: No Wound Preparation Ulcer Cleansing: Rinsed/Irrigated with Saline Topical Anesthetic Applied: Other: lidocaine 4%, Treatment Notes 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 Cleansed with: Clean wound with Normal Saline 2. Anesthetic Topical Lidocaine 4% cream to wound bed prior to debridement 4. Dressing Applied: Prisma Ag 5. Secondary Dressing Applied Guaze, ABD and kerlix/Conform Electronic Signature(s) Signed: 07/15/2016 5:13:45 PM By: [name] Entered By: [name] on 07/15/2016 16:43:39 [name] ([id number]) [name], [name] ([id number]) -------------------------------------------------------------------------------- Wound Assessment Details [name] Date of Service: 07/15/2016 3:45 PM Patient Name: N. Patient Account Number: [id number] Medical Record Treating RN: [name] [id number] Number: Other Clinician: May 21, 1981 (35 y.o. Treating [name], [name] Date of Birth/Sex: Female) Physician/Extender: G Primary Care [name] Physician: Referring Physician: [name] in Treatment: 45 Wound Status Wound Number: 17 Primary Open Surgical Wound Etiology: Wound Location: Left Amputation Site - Transmetatarsal Wound Open Status: Wounding Event: Surgical Injury Comorbid Anemia, Type I Diabetes, End Stage Date Acquired: 04/13/2016 History: Renal Disease, Rheumatoid Arthritis, Weeks Of Treatment: 11 Neuropathy Clustered Wound: No Photos Wound Measurements Length: (cm) 1 Width: (cm) 0.9 Depth: (cm) 0.6 Area: (cm) 0.707 Volume: (cm) 0.424 % Reduction in Area: 15.8% % Reduction in Volume: -404.8% Epithelialization: None Tunneling: No Undermining: No Wound Description Classification: Partial Thickness Foul Odor Aft Diabetic  Severity (Wagner): Grade 1 Wound Margin: Flat and Intact Exudate Amount: Medium Exudate Type: Serous Exudate Color: amber er Cleansing: No Wound Bed [name], [name] ([id number]) Granulation Amount: Small (1-33%) Exposed Structure Granulation Quality: Red Fascia Exposed: No Necrotic Amount: Large (67-100%) Fat Layer Exposed: No Necrotic Quality: Adherent Slough Tendon Exposed: No Muscle Exposed: No Joint Exposed: No Bone Exposed: No Limited to Skin Breakdown Periwound Skin Texture Texture Color No Abnormalities Noted: No No Abnormalities Noted: No Callus: No Atrophie Blanche: No Crepitus: No Cyanosis: No Excoriation: No Ecchymosis: No Fluctuance: No Erythema: No Friable: No Hemosiderin Staining: No Induration: No Mottled: No Localized Edema: No Pallor: No Rash: No Rubor: No Scarring: No Moisture No Abnormalities Noted: No Dry / Scaly: No Maceration: No Moist: Yes Wound Preparation Ulcer Cleansing: Rinsed/Irrigated with Saline Topical Anesthetic Applied: Other: lidocaine 4%, Treatment Notes 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 Cleansed with: Clean wound with Normal Saline 2. Anesthetic Topical Lidocaine 4% cream to wound bed prior to debridement 4. Dressing Applied: Prisma Ag 5. Secondary Dressing Applied Guaze, ABD and kerlix/Conform Electronic Signature(s) Signed: 07/15/2016 5:13:45 PM By: [name] Entered By: [name] on 07/15/2016 16:44:02 [name] ([id number]) [name], [name] ([id number]) --------------------------------------------------------------------------------  Wound Assessment Details [name], [name] Date of Service: 07/15/2016 3:45 PM Patient Name: N. Patient Account Number: [id number] Medical Record Treating RN: [name] [id number] Number: Other Clinician: 1980/10/22 (35 y.o. Treating [name], [name] Date of Birth/Sex: Female) Physician/Extender: G Primary Care [name] Physician: Referring Physician: [name] in Treatment: 45 Wound Status Wound Number: 18 Primary Pressure Ulcer Etiology: Wound Location: Left Ischial Tuberosity Wound Open Wounding Event: Pressure Injury Status: Date Acquired: 05/12/2016 Comorbid Anemia, Type I Diabetes, End Stage Weeks Of Treatment: 6 History: Renal Disease, Rheumatoid Arthritis, Clustered Wound: No Neuropathy Photos Wound Measurements Length: (cm) 3 Width: (cm) 2.4 Depth: (cm) 2 Area: (cm) 5.655 Volume: (cm) 11.31 % Reduction in Area: 57.9% % Reduction in Volume: -742.1% Epithelialization: Small (1-33%) Tunneling: No Undermining: No Wound Description Classification: Category/Stage II Wound Margin: Flat and Intact Exudate Amount: Large Exudate Type: Serous Exudate Color: amber Wound Bed Granulation Amount: Large (67-100%) Exposed Structure [name], [name] ([id number]) Granulation Quality: Pink Fascia Exposed: No Necrotic Amount: Small (1-33%) Fat Layer Exposed: No Necrotic Quality: Eschar, Adherent Slough Tendon Exposed: No Muscle Exposed: No Joint Exposed: No Bone Exposed: No Limited to Skin Breakdown Periwound Skin Texture Texture Color No Abnormalities Noted: No No Abnormalities Noted: No Callus: No Atrophie Blanche: No Crepitus: No Cyanosis: No Excoriation: No Ecchymosis: No Fluctuance: No Erythema: Yes Friable: No Erythema Location: Circumferential Induration: No Hemosiderin Staining: No Localized Edema: No Mottled: No Rash: No Pallor: No Scarring: No Rubor: No Moisture No Abnormalities Noted: No Dry / Scaly: No Maceration: No Moist: Yes Wound Preparation Ulcer Cleansing: Rinsed/Irrigated with Saline Topical Anesthetic Applied: Other: lidocaine 4%, Treatment Notes Wound #18 (Left Ischial Tuberosity) 1. Cleansed with: Clean wound with Normal Saline 2. Anesthetic Topical Lidocaine 4% cream to wound bed prior to debridement 4. Dressing  Applied: Prisma Ag 5. Secondary Dressing Applied Guaze, ABD and kerlix/Conform Electronic Signature(s) Signed: 07/15/2016 5:13:45 PM By: [name] Entered By: [name] on 07/15/2016 16:44:25 [name] ([id number]) -------------------------------------------------------------------------------- Wound Assessment Details [name] Date of Service: 07/15/2016 3:45 PM Patient Name: N. Patient Account Number: [id number] Medical Record Treating RN: [name] [id number] Number: Other Clinician: [birth year] (35 y.o. Treating [name], [name] Date of Birth/Sex: Female) Physician/Extender: G Primary Care [name] Physician: Referring Physician: [name] in Treatment: 45 Wound Status Wound Number: 9 Primary Diabetic Wound/Ulcer of the Lower Etiology: Extremity Wound Location: Left Calcaneous Wound Open Wounding Event: Pressure Injury Status: Date Acquired: 07/28/2015 Comorbid Anemia, Type I Diabetes, End Stage Weeks Of Treatment: 45 History: Renal Disease, Rheumatoid Arthritis, Clustered Wound: No Neuropathy Photos Wound Measurements Length: (cm) 4.5 % Reduction in Area Width: (cm) 3.2 % Reduction in Volu Depth: (cm) 0.1 Epithelialization: Area: (cm) 11.31 Tunneling: Volume: (cm) 1.131 Undermining: : 39.1% me: 39.1% None No No Wound Description Classification: Grade 2 Foul Odor After Cle Wound Margin: Flat and Intact Exudate Amount: None Present ansing: No Wound Bed Granulation Amount: Medium (34-66%) Exposed Structure Granulation Quality: Red Fascia Exposed: No Necrotic Amount: Medium (34-66%) Fat Layer Exposed: No Bruntz, [name] ([id number]) Necrotic Quality: Eschar, Adherent Slough Tendon Exposed: No Muscle Exposed: No Joint Exposed: No Bone Exposed: No Limited to Skin Breakdown Periwound Skin Texture Texture Color No Abnormalities Noted: No No Abnormalities Noted: No Callus: No Atrophie  Blanche: No Crepitus: No Cyanosis: No Excoriation: No Ecchymosis: No Fluctuance: No Erythema: No Friable: No Hemosiderin Staining: No Induration: No Mottled: No Localized Edema: No Pallor: No Rash: No Rubor: No Scarring: No Moisture No Abnormalities Noted: No Dry /  Scaly: No Maceration: No Moist: Yes Wound Preparation Ulcer Cleansing: Rinsed/Irrigated with Saline Topical Anesthetic Applied: Other: lidocaine 4%, Treatment Notes Wound #9 (Left Calcaneous) 1. Cleansed with: Clean wound with Normal Saline 2. Anesthetic Topical Lidocaine 4% cream to wound bed prior to debridement 4. Dressing Applied: Xeroform 5. Secondary Dressing Applied Guaze, ABD and kerlix/Conform 7. Secured with Secretary/administratorTape Electronic Signature(s) Signed: 07/15/2016 5:13:45 PM By: Curtis Sitesorthy, Joanna Entered By: Curtis Sitesorthy, Joanna on 07/15/2016 16:45:02 Diana EvesBLACKBURN, Darnita N. ([id number]) -------------------------------------------------------------------------------- Vitals Details [name] PayorBLACKBURN, Bebe Date of Service: 07/15/2016 3:45 PM Patient Name: N. Patient Account Number: [id number] Medical Record Treating RN: Curtis SitesDorthy, Joanna [id number] Number: Other Clinician: [birth year] (35 y.o. Treating [name], [name] Date of Birth/Sex: Female) Physician/Extender: G Primary Care Rolin Barrylmedo, Mario Physician: Referring Physician: [name] Partridgelmedo, Mario Weeks in Treatment: 45 Vital Signs Time Taken: 16:15 Pulse (bpm): 75 Height (in): 68 Respiratory Rate (breaths/min): 16 Weight (lbs): 96 Blood Pressure (mmHg): 135/98 Body Mass Index (BMI): 14.6 Reference Range: 80 - 120 mg / dl Electronic Signature(s) Signed: 07/15/2016 5:13:45 PM By: Curtis Sitesorthy, Joanna Entered By: Curtis Sitesorthy, Joanna on 07/15/2016 16:17:08

## 2016-07-16 NOTE — Progress Notes (Addendum)
LEVETTA, BOGNAR ([id number]) Visit Report for 07/15/2016 Chief Complaint Document Details BRIETTA, MANSO Date of Service: 07/15/2016 3:45 PM Patient Name: N. Patient Account Number: [id number] Medical Record Treating RN: [name] [id number] Number: Other Clinician: Mar 08, 1981 (35 y.o. Treating Constance Whittle Date of Birth/Sex: Female) Physician/Extender: G Primary Care [name] Physician: Referring Physician: [name] in Treatment: 45 Information Obtained from: Patient Chief Complaint Patient in today for treatment of non-healing wound and HBO Treatment. she has just gotten out of hospital this week and is back to resume her hyperbaric oxygen therapy Electronic Signature(s) Signed: 07/15/2016 5:07:12 PM By: Baltazar Najjar MD Entered By: Baltazar Najjar on 07/15/2016 16:54:26 [name] ([id number]) -------------------------------------------------------------------------------- Debridement Details [name] Date of Service: 07/15/2016 3:45 PM Patient Name: N. Patient Account Number: [id number] Medical Record Treating RN: [name] [id number] Number: Other Clinician: Mar 13, 1981 (35 y.o. Treating Knut Rondinelli Date of Birth/Sex: Female) Physician/Extender: G Primary Care [name] Physician: Referring Physician: [name] in Treatment: 45 Debridement Performed for Wound #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 Assessment: Performed By: Physician [name], MD Debridement: Debridement Pre-procedure Yes - 16:38 Verification/Time Out Taken: Start Time: 16:38 Pain Control: Lidocaine 4% Topical Solution Level: Skin/Subcutaneous Tissue Total Area Debrided (L x 1 (cm) x 0.9 (cm) = 0.9 (cm) W): Tissue and other Viable, Non-Viable, Eschar, Fibrin/Slough, Subcutaneous material debrided: Instrument: Curette Bleeding: Minimum Hemostasis Achieved: Pressure End Time: 16:42 Procedural Pain:  0 Post Procedural Pain: 0 Response to Treatment: Procedure was tolerated well Post Debridement Measurements of Total Wound Length: (cm) 1 Width: (cm) 0.9 Depth: (cm) 1 Volume: (cm) 0.707 Character of Wound/Ulcer Post Improved Debridement: Severity of Tissue Post Debridement: Fat layer exposed Post Procedure Diagnosis Same as Pre-procedure Electronic Signature(s) RENLEIGH, OUELLET ([id number]) Signed: 07/15/2016 5:07:12 PM By: Baltazar Najjar MD Signed: 07/15/2016 5:13:45 PM By: [name] Entered By: Baltazar Najjar on 07/15/2016 16:54:09 [name] ([id number]) -------------------------------------------------------------------------------- HPI Details [name] Date of Service: 07/15/2016 3:45 PM Patient Name: N. Patient Account Number: [id number] Medical Record Treating RN: [name] [id number] Number: Other Clinician: 17-Dec-1980 (35 y.o. Treating Beulah Matusek Date of Birth/Sex: Female) Physician/Extender: G Primary Care [name] Physician: Referring Physician: [name] in Treatment: 45 History of Present Illness Location: dry gangrene both feet and heels Quality: Patient reports No Pain. Severity: Patient states wound are getting worse. Duration: Patient has had the wound for > 4months prior to seeking treatment at the wound center Context: The wound appeared gradually over time Modifying Factors: she has been in and out of hospital over the last 2 months Associated Signs and Symptoms: Patient reports having difficulty standing for long periods. HPI Description: [name] is a 35 y.o. female who presents to our wound center, back in June 2016, referred by her PCP Dr. Zada Finders for nonhealing ulcers on the lateral aspect of the right heel. Of note she has a history of type 1 diabetes mellitus that has been uncontrolled. Past medical history significant for type 1 diabetes mellitus not controlled, ankylosing  spondylitis, anorexia nervosa, irritable bowel syndrome, chronic kidney disease, chronic diarrhea. she then developed gangrene of both feet due to severe peripheral vascular disease and also had gangrene of the tips of her fingers due to upper extremity vascular disease. She was being worked up by vascular surgery at [facility] and at [facility] and has had several procedures done there. She started with hyperbaric oxygen therapy and had a total of 40 treatments the last one being on 06/20/2015. After  the initial treatment of hyperbaric oxygen therapy she started having ear problems and had ultimately to use myringotomy tubes and this was done bilaterally. Since then her ears have been doing fine. In late September, she had seen vascular and hand surgeons. since then she's been in [place] at the rec center for surgery involving extensive vascular procedures for the upper extremities. She was then at [facility] with GI bleeds both upper and lower and has been in and out of hospital for that. She has recently been out of hospital for the last week. 09/09/2015 -- she was unable to get here in time to start her hyperbaric oxygen today and hence is only here for a wound care visit. 09/19/2015 -- she has been having vancomycin during her dialysis and continues to have vascular appointments and the procedure is been set for early January. She has been unable to make it for her hemodialysis due to various medical symptoms. 09/30/2014 -- her vancomycin was stopped on 09/25/2015 and the mother has noticed the right foot has started draining for the last 3 days. Addendum: after examining the patient I was able to talk to her primary vascular surgeon Dr. Pernell Dupre at the [facility]. I told him about the necrotic area on the plantar aspect of right foot which is now wet gangrene and he agreed with me that he would admit her at [facility] under her care and synchronize further treatment. We have discussed her poor  prognosis and he and I discussed the need for hospice care [facility], SHANEY AGUSTIN. ([id number]) and for sitting down and talking to the patient and her mother and giving them a proper detail of the prognosis. 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 She was worked up with MRIs, abdomen aortogram and placement of left-sided angioplasties were done. After a prolonged hospital she she was discharged home and was told to wear shrinker sock and stump protector and see her surgeon for further instructions regarding wound care and suture removal. Asked to take long-term doxycycline. 01/15/16; this is a patient I haven't seen before although she is been followed by Dr. Meyer Russel in this clinic today. She is a type I diabetic with severe PAD macrovascular disease. She has had a previous BKA. She has dry gangrene of the tips of her fingers which she showed me on the right to. She also has dry gangrene of the left first second and third toes and a portion of her proximal foot around these areas. She is followed by vascular surgery at [facility] and saw them recently they are not going to do surgery as of yet. She has a large black eschar over her heel which is beginning to separate in some areas. As I understand think she is paining these with Betadine. There is been some suggestion about retrying hyperbarics on her although she is still not able to commit to the frequency of treatment that would be necessary to see improvement. She is also on Monday Wednesday Friday dialysis 01/21/16; the patient returns to see me today with  regards to the left heel. Apparently she is not scheduled for any further attempts at revascularization of  the left foot. 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 01/28/16; the patient returns today for attention to the left foot specifically the left calcaneal ulcer. This is covered in a thick black eschar. I crosshatched this last week and we have been using Santyl. 02/05/16; I continue to work on the thick black eschar on the patient's left calcaneus. She is using Santyl that this side crosshatched this area and the eschar is beginning to loosen. 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 This is beginning to separate and liquefy as well especially between the first and second toes. In the time being her major complaint is fatigue at dialysis 02/26/16 I continue to work on the patient's left calcaneus thick adherent black eschar. I crosshatched this area and we have been using Santyl although it is very adherent area I remove some nonviable tissue today what I can see of this actually looks surprisingly good. On the same foot she has dry gangrene on the first and second toes and part of the forefoot underneath this. This is beginning to separate. 03/11/16; the patient comes in for her every 2 week appointment. I have been working on the black eschar on her heel. The patient apparently again has to come off dialysis early today after 2 hours due to severe complaints of nausea. She really does not look well. 04/02/2016 -- the patient has not been seen here for about 3 weeks now and has a new issue with the stump of the right amputation site and also her right posterior thigh. They have only noticed this for the last couple of days. 04/28/16; I had received a call from the patient's surgeon at  [facility]. She had a left transmetatarsal amputation and Integra applied to the left heel. Both of these areas appear to be doing well. There are dressing these with [facility] and they will return to their surgeon on Thursday. She has a new injury on the popliteal fossa on the right which I think was trauma from her stump. 05/20/16; the patient is following with her surgeon at [facility]. She's had a left transmetatarsal debridement of the left heel she had Integra place and apparently is using some consternation of Epson salts soaks, Betadine and Aquacel Ag. The left leg is wrapped I did not look at this today. She has the wound in her right popliteal fossa which is apparently a pressure area possibly related to sitting on a toilet for 2-3 hours multiple times a day. 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 We have been using Santyl to the area on the right popliteal fossa. She has Ailey, Tinya N. ([id number]) a new wound on her right gluteal area but the patient would not let me look at that today 06/03/16; patient is not doing particularly well. She now has a pressure area over her left ischial tuberosity. This is of quite a size and covered with an adherent surface slough. She looks as though she has lost weight, I didn't want to go ahead and attempt to debridement this today. There is really no evidence of infection. The area behind the right popliteal fossa looks about the same as 2 weeks ago we have been using Santyl to this area. I  did not look at her left foot which is wrapped been followed by podiatry at [facility] 06/10/16; patient arrives in clinic actually looking a lot brighter than I usually see her, predictably she did not go to dialysis today. For the first time in perhaps 6 weeks I actually saw her left foot today. The transmetatarsal site is healed. The left heel has a reasonably stable-looking wound which has a  clean base. Some eschar superiorly and a few sutures remain in place. More worrisome is an area on her lateral left foot over the metatarsal head. Quarter size necrotic wound that probes to bone. There is some purulence here which I cultured there is nothing that looks like a healthy base of this area. They've been using silver alginate to this area at home. She also has a large wound in the right popliteal fossa, and again a pressure area over the left ischial tuberosity. We are using Santyl to both of these areas. Both allays looks somewhat better than last week, using Santyl to both areas 06/17/16: culture from last week grew citrobacter and amp sensitive enterococcus fecalis. Started on vanc last Saturday at dialysis. have spoken to dialysis in [place] re adjustment in antibiotics added ceftazidine to vanc 06/24/16; the patient was discharged from [facility] yesterday. She had an IandD of the open area on the lateral aspect of her foot. She continues on vancomycin and Ceptaz again at dialysis although I'm not exactly sure of the current duration of this. She also had a debridement of the wound over the left greater trochanter done by the wound care team. She is receiving Santyl based dressings to this and the area in her right popliteal fossa. She arrives today completely fatigued from dialysis. I spoke to Dr. Allena Katz her podiatrist and surgeon at [facility] and asked if we could manage the wound VAC which I think we can. He also wanted to ask about hyperbaric oxygen with the indication of chronic osteomyelitis although at this point I am not completely certain where the chronic osteomyelitis is. Finally she apparently has had a re-graft to the area on her left heel which is either a skin graft or Integra 07/15/16; patient arrives today she is not been in the clinic since I saw her on 9/27. She's been using Santyl to the left greater trochanter, I popliteal fossa. Her home health nurse remove the  wound VAC from the transmetatarsal head, there is only a small wound on the medial aspect of the foot. Dr. Allena Katz who is been managing the heel wounds has requested Xeroform to the heel. Electronic Signature(s) Signed: 07/15/2016 5:07:12 PM By: Baltazar Najjar MD Entered By: Baltazar Najjar on 07/15/2016 16:56:36 [name] ([id number]) -------------------------------------------------------------------------------- Physical Exam Details [name] Date of Service: 07/15/2016 3:45 PM Patient Name: N. Patient Account Number: [id number] Medical Record Treating RN: [name] [id number] Number: Other Clinician: November 03, 1980 (35 y.o. Treating Vernice Mannina Date of Birth/Sex: Female) Physician/Extender: G Primary Care [name] Physician: Referring Physician: [name] in Treatment: 45 Constitutional Sitting or standing Blood Pressure is within target range for patient.. Pulse regular and within target range for patient.Marland Kitchen Respirations regular, non-labored and within target range.. Temperature is normal and within the target range for the patient.. Patient looks fatigued, states this is because her hemoglobin is low and she is going for a transfusion tomorrow. Notes Wound exam #1 small area on the lateral aspect of her left transmetatarsal amputation site is debrided with a curet of surface slough nonviable subcutaneous  tissue this cleans up fairly well. #2 her left heel wound is roughly 50% epithelialized 50% open. We will honor Dr. Eliane Decree request for Xeroform and a foam cover. #3 deep area over her left greater trochanter. This is a deep wound however the surface appears clean. I think a wound VAC would be reasonable with collagen under the foam #4 stage III wound in the right popliteal fossa from pressure of the toilet seat. Again I think this is clean Santyl was done his job in this area I would like to try Prisma to this as well Electronic  Signature(s) Signed: 07/15/2016 5:07:12 PM By: Baltazar Najjar MD Entered By: Baltazar Najjar on 07/15/2016 16:59:36 [name] ([id number]) -------------------------------------------------------------------------------- Physician Orders Details [name] Date of Service: 07/15/2016 3:45 PM Patient Name: N. Patient Account Number: [id number] Medical Record Treating RN: [name] [id number] Number: Other Clinician: 12-20-1980 (35 y.o. Treating Maleka Contino Date of Birth/Sex: Female) Physician/Extender: G Primary Care [name] Physician: Referring Physician: [name] in Treatment: 75 Verbal / Phone Orders: Yes Clinician: [name] Read Back and Verified: Yes Diagnosis Coding Wound Cleansing 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,Posterior Amputation Site - Below Knee o Clean wound with Normal Saline. o Cleanse wound with mild soap and water Wound #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 o Clean wound with Normal Saline. o Cleanse wound with mild soap and water Wound #18 Left Ischial Tuberosity o Clean wound with Normal Saline. o Cleanse wound with mild soap and water Wound #9 Left Calcaneous o Clean wound with Normal Saline. 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,Posterior Amputation Site - Below Knee o Topical Lidocaine 4% cream applied to wound bed prior to debridement Wound #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 o Topical Lidocaine 4% cream applied to wound bed prior to debridement Wound #18 Left Ischial Tuberosity o Topical Lidocaine 4% cream applied to wound bed prior to debridement Wound #9 Left Calcaneous o Topical Lidocaine 4% cream applied to wound bed prior to debridement Primary Wound Dressing 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,Posterior Amputation Site - Below Knee o Prisma Ag - or collagen with silver equivalent Waid, Laria N. ([id number]) Wound #17 Left Amputation Site -  Transmetatarsal o Prisma Ag - or collagen with silver equivalent Wound #18 Left Ischial Tuberosity o Prisma Ag - or collagen with silver equivalent Wound #9 Left Calcaneous o Xeroform Secondary Dressing 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,Posterior Amputation Site - Below Knee o Boardered Foam Dressing Wound #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 o Gauze, ABD and Kerlix/Conform - wrap lower leg with ace wrap Wound #18 Left Ischial Tuberosity o Boardered Foam Dressing Wound #9 Left Calcaneous o Gauze, ABD and Kerlix/Conform - wrap lower leg with ace wrap Dressing Change Frequency 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,Posterior Amputation Site - Below Knee o Change dressing every day. Wound #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 o Change dressing every day. Wound #18 Left Ischial Tuberosity o Change dressing every day. Wound #9 Left Calcaneous o Change dressing every day. Follow-up Appointments 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,Posterior Amputation Site - Below Knee o Return Appointment in 2 weeks. o Other: - as patient is able Wound #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 o Return Appointment in 2 weeks. o Other: - as patient is able Wound #18 Left Ischial Tuberosity o Return Appointment in 2 weeks. o Other: - as patient is able Hoyt, Pranathi N. ([id number]) Wound #9 Left Calcaneous o Return Appointment in 2 weeks. o Other: - as patient is able Home Health 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,Posterior Amputation Site -  Below Knee o Continue Home Health Visits o Home Health Nurse may visit PRN to address patientos wound care needs. o FACE TO FACE ENCOUNTER: MEDICARE and MEDICAID PATIENTS: I certify that this patient is under my care and that I had a face-to-face encounter that meets the physician face-to-face encounter requirements with this patient on this date. The encounter with the patient was in whole or in part for the following MEDICAL CONDITION: (primary reason for Home  Healthcare) MEDICAL NECESSITY: I certify, that based on my findings, NURSING services are a medically necessary home health service. HOME BOUND STATUS: I certify that my clinical findings support that this patient is homebound (i.e., Due to illness or injury, pt requires aid of supportive devices such as crutches, cane, wheelchairs, walkers, the use of special transportation or the assistance of another person to leave their place of residence. There is a normal inability to leave the home and doing so requires considerable and taxing effort. Other absences are for medical reasons / religious services and are infrequent or of short duration when for other reasons). o If current dressing causes regression in wound condition, may D/C ordered dressing product/s and apply Normal Saline Moist Dressing daily until next Wound Healing Center / Other MD appointment. Notify Wound Healing Center of regression in wound condition at [phone]. o Please direct any NON-WOUND related issues/requests for orders to patient's Primary Care Physician Wound #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 o Continue Home Health Visits o Home Health Nurse may visit PRN to address patientos wound care needs. o FACE TO FACE ENCOUNTER: MEDICARE and MEDICAID PATIENTS: I certify that this patient is under my care and that I had a face-to-face encounter that meets the physician face-to-face encounter requirements with this patient on this date. The encounter with the patient was in whole or in part for the following MEDICAL CONDITION: (primary reason for Home Healthcare) MEDICAL NECESSITY: I certify, that based on my findings, NURSING services are a medically necessary home health service. HOME BOUND STATUS: I certify that my clinical findings support that this patient is homebound (i.e., Due to illness or injury, pt requires aid of supportive devices such as crutches, cane, wheelchairs, walkers, the use of  special transportation or the assistance of another person to leave their place of residence. There is a normal inability to leave the home and doing so requires considerable and taxing effort. Other absences are for medical reasons / religious services and are infrequent or of short duration when for other reasons). o If current dressing causes regression in wound condition, may D/C ordered dressing product/s and apply Normal Saline Moist Dressing daily until next Wound Healing Center / Other MD appointment. Notify Wound Healing Center of regression in wound condition at [phone]. o Please direct any NON-WOUND related issues/requests for orders to patient's Primary Care Physician Wound #18 Left Ischial Tuberosity o Continue Home Health Visits ROSABELLE, JUPIN ([id number]) o Home Health Nurse may visit PRN to address patientos wound care needs. o FACE TO FACE ENCOUNTER: MEDICARE and MEDICAID PATIENTS: I certify that this patient is under my care and that I had a face-to-face encounter that meets the physician face-to-face encounter requirements with this patient on this date. The encounter with the patient was in whole or in part for the following MEDICAL CONDITION: (primary reason for Home Healthcare) MEDICAL NECESSITY: I certify, that based on my findings, NURSING services are a medically necessary home health service. HOME BOUND STATUS: I certify that my clinical findings support that  this patient is homebound (i.e., Due to illness or injury, pt requires aid of supportive devices such as crutches, cane, wheelchairs, walkers, the use of special transportation or the assistance of another person to leave their place of residence. There is a normal inability to leave the home and doing so requires considerable and taxing effort. Other absences are for medical reasons / religious services and are infrequent or of short duration when for other reasons). o If current  dressing causes regression in wound condition, may D/C ordered dressing product/s and apply Normal Saline Moist Dressing daily until next Wound Healing Center / Other MD appointment. Notify Wound Healing Center of regression in wound condition at [phone]. o Please direct any NON-WOUND related issues/requests for orders to patient's Primary Care Physician Wound #9 Left Calcaneous o Continue Home Health Visits o Home Health Nurse may visit PRN to address patientos wound care needs. o FACE TO FACE ENCOUNTER: MEDICARE and MEDICAID PATIENTS: I certify that this patient is under my care and that I had a face-to-face encounter that meets the physician face-to-face encounter requirements with this patient on this date. The encounter with the patient was in whole or in part for the following MEDICAL CONDITION: (primary reason for Home Healthcare) MEDICAL NECESSITY: I certify, that based on my findings, NURSING services are a medically necessary home health service. HOME BOUND STATUS: I certify that my clinical findings support that this patient is homebound (i.e., Due to illness or injury, pt requires aid of supportive devices such as crutches, cane, wheelchairs, walkers, the use of special transportation or the assistance of another person to leave their place of residence. There is a normal inability to leave the home and doing so requires considerable and taxing effort. Other absences are for medical reasons / religious services and are infrequent or of short duration when for other reasons). o If current dressing causes regression in wound condition, may D/C ordered dressing product/s and apply Normal Saline Moist Dressing daily until next Wound Healing Center / Other MD appointment. Notify Wound Healing Center of regression in wound condition at [phone]. o Please direct any NON-WOUND related issues/requests for orders to patient's Primary Care Physician Negative Pressure  Wound Therapy Wound #18 Left Ischial Tuberosity o Wound VAC settings at 125/130 mmHg continuous pressure. Use BLACK/GREEN foam to wound cavity. Use WHITE foam to fill any tunnel/s and/or undermining. Change VAC dressing 3 X WEEK. Change canister as indicated when full. Nurse may titrate settings and frequency of dressing changes as clinically indicated. o Home Health Nurse may d/c VAC for s/s of increased infection, significant wound regression, or uncontrolled drainage. Notify Wound Healing Center at [phone]. MARYROSE, COLVIN ([id number]) Electronic Signature(s) Signed: 07/15/2016 5:07:12 PM By: Baltazar Najjar MD Signed: 07/15/2016 5:13:45 PM By: [name] Entered By: [name] on 07/15/2016 16:47:46 [name] ([id number]) -------------------------------------------------------------------------------- Problem List Details [name] Date of Service: 07/15/2016 3:45 PM Patient Name: N. Patient Account Number: [id number] Medical Record Treating RN: [name] [id number] Number: Other Clinician: July 07, 1981 (35 y.o. Treating Brodric Schauer Date of Birth/Sex: Female) Physician/Extender: G Primary Care [name] Physician: Referring Physician: [name] in Treatment: 21 Active Problems ICD-10 Encounter Code Description Active Date Diagnosis E10.621 Type 1 diabetes mellitus with foot ulcer 08/30/2015 Yes E10.52 Type 1 diabetes mellitus with diabetic peripheral 08/30/2015 Yes angiopathy with gangrene I70.245 Atherosclerosis of native arteries of left leg with ulceration 08/30/2015 Yes of other part of foot I70.261 Atherosclerosis of native arteries of extremities with 08/30/2015 Yes  gangrene, right leg L89.622 Pressure ulcer of left heel, stage 2 08/30/2015 Yes Z89.511 Acquired absence of right leg below knee 11/29/2015 Yes T87.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, right lower extremity 04/02/2016 Yes S71.101A Unspecified open  wound, right thigh, initial encounter 04/02/2016 Yes L89.322 Pressure ulcer of left buttock, stage 2 06/10/2016 Yes Kielbasa, KARYSA HEFT ([id number]) Inactive Problems Resolved Problems ICD-10 Code Description Active Date Resolved Date L02.611 Cutaneous abscess of right foot 10/01/2015 10/01/2015 Electronic Signature(s) Signed: 07/15/2016 5:07:12 PM By: Baltazar Najjar MD Entered By: Baltazar Najjar on 07/15/2016 16:53:46 [name] ([id number]) -------------------------------------------------------------------------------- Progress Note Details [name] Date of Service: 07/15/2016 3:45 PM Patient Name: N. Patient Account Number: [id number] Medical Record Treating RN: [name] [id number] Number: Other Clinician: 11-06-1980 (35 y.o. Treating Zyion Doxtater Date of Birth/Sex: Female) Physician/Extender: G Primary Care [name] Physician: Referring Physician: [name] in Treatment: 45 Subjective Chief Complaint Information obtained from Patient Patient in today for treatment of non-healing wound and HBO Treatment. she has just gotten out of hospital this week and is back to resume her hyperbaric oxygen therapy History of Present Illness (HPI) The following HPI elements were documented for the patient's wound: Location: dry gangrene both feet and heels Quality: Patient reports No Pain. Severity: Patient states wound are getting worse. Duration: Patient has had the wound for > 4months prior to seeking treatment at the wound center Context: The wound appeared gradually over time Modifying Factors: she has been in and out of hospital over the last 2 months Associated Signs and Symptoms: Patient reports having difficulty standing for long periods. Tyrea Froberg is a 35 y.o. female who presents to our wound center, back in June 2016, referred by her PCP Dr. Zada Finders for nonhealing ulcers on the lateral aspect of the right heel. Of note she has a  history of type 1 diabetes mellitus that has been uncontrolled. Past medical history significant for type 1 diabetes mellitus not controlled, ankylosing spondylitis, anorexia nervosa, irritable bowel syndrome, chronic kidney disease, chronic diarrhea. she then developed gangrene of both feet due to severe peripheral vascular disease and also had gangrene of the tips of her fingers due to upper extremity vascular disease. She was being worked up by vascular surgery at [facility] and at [facility] and has had several procedures done there. She started with hyperbaric oxygen therapy and had a total of 40 treatments the last one being on 06/20/2015. After the initial treatment of hyperbaric oxygen therapy she started having ear problems and had ultimately to use myringotomy tubes and this was done bilaterally. Since then her ears have been doing fine. In late September, she had seen vascular and hand surgeons. since then she's been in [place] at the rec center for surgery involving extensive vascular procedures for the upper extremities. She was then at [facility] with GI bleeds both upper and lower and has been in and out of hospital for that. She has recently been out of hospital for the last week. 09/09/2015 -- she was unable to get here in time to start her hyperbaric oxygen today and hence is only here for a wound care visit. 09/19/2015 -- she has been having vancomycin during her dialysis and continues to have vascular Grosshans, Jadan N. ([id number]) appointments and the procedure is been set for early January. She has been unable to make it for her hemodialysis due to various medical symptoms. 09/30/2014 -- her vancomycin was stopped on 09/25/2015 and the mother has noticed the right foot has started draining  for the last 3 days. Addendum: after examining the patient I was able to talk to her primary vascular surgeon Dr. Pernell Dupre at the [facility]. I told him about the necrotic area on the plantar  aspect of right foot which is now wet gangrene and he agreed with me that he would admit her at [facility] under her care and synchronize further treatment. We have discussed her poor prognosis and he and I discussed the need for hospice care and for sitting down and talking to the patient and her mother and giving them a proper detail of the prognosis. 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 She was worked up with MRIs, abdomen aortogram and placement of left-sided angioplasties were done. After a prolonged hospital she she was discharged home and was told to wear shrinker sock and stump protector and see her surgeon for further instructions regarding wound care and suture removal. Asked to take long-term doxycycline. 01/15/16; this is a patient I haven't seen before although she is been followed by Dr. Meyer Russel in this clinic today. She is a type I diabetic with severe PAD macrovascular disease. She has had a previous BKA. She has dry gangrene of the tips of her fingers which she showed me on the right to. She also has dry gangrene of the left first second and third toes and a portion of her proximal foot around these areas. She is followed by vascular surgery at [facility] and saw them recently they are not going to do surgery as of yet. She has a large black eschar over her heel which is beginning to separate in some areas. As I understand think she is paining these with Betadine. There is been some suggestion about retrying hyperbarics on her although she is still not able to commit to the frequency of  treatment that would be necessary to see improvement. She is also on Monday Wednesday Friday dialysis 01/21/16; the patient returns to see me today with regards to the left heel. Apparently she is not scheduled for any further attempts at revascularization of the left foot. 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 01/28/16; the patient returns today for attention to the left foot specifically the left calcaneal ulcer. This is covered in a thick black eschar. I crosshatched this last week and we have been using Santyl. 02/05/16; I continue to work on the thick black eschar on the patient's left calcaneus. She is using Santyl that this side crosshatched this area and the eschar is beginning to loosen. 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 This is beginning to separate and liquefy as well especially between the first and second toes. In the time being her major complaint is fatigue at dialysis 02/26/16 I continue to work on the patient's left calcaneus thick adherent black eschar. I crosshatched this area and we have been using Santyl although it is very adherent area I remove some nonviable tissue today  what I can see of this actually looks surprisingly good. On the same foot she has dry gangrene on the first and second toes and part of the forefoot underneath this. This is beginning to separate. 03/11/16; the patient comes in for her every 2 week appointment. I have been working on the black eschar on her heel. The patient apparently again has to come off dialysis early today after 2 hours due to severe complaints of nausea. She really does not look well. 04/02/2016 -- the patient has not been seen here for about 3 weeks now and has a new issue with the stump of the right amputation site  and also her right posterior thigh. They have only noticed this for the last couple of days. 04/28/16; I had received a call from the patient's surgeon at [facility]. She had a left transmetatarsal amputation Welshans, Ethyl N. ([id number]) and Integra applied to the left heel. Both of these areas appear to be doing well. There are dressing these with [facility] and they will return to their surgeon on Thursday. She has a new injury on the popliteal fossa on the right which I think was trauma from her stump. 05/20/16; the patient is following with her surgeon at [facility]. She's had a left transmetatarsal debridement of the left heel she had Integra place and apparently is using some consternation of Epson salts soaks, Betadine and Aquacel Ag. The left leg is wrapped I did not look at this today. She has the wound in her right popliteal fossa which is apparently a pressure area possibly related to sitting on a toilet for 2-3 hours multiple times a day. 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 We have been using Santyl to the area on the right popliteal fossa. She has a new wound on her right gluteal area but the patient would not let me look at that today 06/03/16; patient is not doing particularly well. She now has a pressure area over her left ischial tuberosity. This is of quite a size and covered with an adherent surface slough. She looks as though she has lost weight, I didn't want to go ahead and attempt to debridement this today. There is really no evidence of infection. The area behind the right popliteal fossa looks about the same as 2 weeks ago we have been using Santyl to this area. I did not look at her left foot which is wrapped been followed by podiatry at [facility] 06/10/16; patient arrives in clinic actually looking a lot brighter than I usually see her, predictably she did not go to dialysis today. For the first time in  perhaps 6 weeks I actually saw her left foot today. The transmetatarsal site is healed. The left heel has a reasonably stable-looking wound which has a clean base. Some eschar superiorly and a few sutures remain in place. More worrisome is an area on her lateral left foot over the metatarsal head. Quarter size necrotic wound that probes to bone. There is some purulence here which I cultured there is nothing that looks like a healthy base of this area. They've been using silver alginate to this area at home. She also has a large wound in the right popliteal fossa, and again a pressure area over the left ischial tuberosity. We are using Santyl to both of these areas. Both allays looks somewhat better than last week, using Santyl to both areas 06/17/16: culture from last week grew citrobacter  and amp sensitive enterococcus fecalis. Started on vanc last Saturday at dialysis. have spoken to dialysis in [place] re adjustment in antibiotics added ceftazidine to vanc 06/24/16; the patient was discharged from [facility] yesterday. She had an IandD of the open area on the lateral aspect of her foot. She continues on vancomycin and Ceptaz again at dialysis although I'm not exactly sure of the current duration of this. She also had a debridement of the wound over the left greater trochanter done by the wound care team. She is receiving Santyl based dressings to this and the area in her right popliteal fossa. She arrives today completely fatigued from dialysis. I spoke to Dr. Allena Katz her podiatrist and surgeon at [facility] and asked if we could manage the wound VAC which I think we can. He also wanted to ask about hyperbaric oxygen with the indication of chronic osteomyelitis although at this point I am not completely certain where the chronic osteomyelitis is. Finally she apparently has had a re-graft to the area on her left heel which is either a skin graft or Integra 07/15/16; patient arrives today she is not been  in the clinic since I saw her on 9/27. She's been using Santyl to the left greater trochanter, I popliteal fossa. Her home health nurse remove the wound VAC from the transmetatarsal head, there is only a small wound on the medial aspect of the foot. Dr. Allena Katz who is been managing the heel wounds has requested Xeroform to the heel. Objective Constitutional Esperanza, Tateanna N. ([id number]) Sitting or standing Blood Pressure is within target range for patient.. Pulse regular and within target range for patient.Marland Kitchen Respirations regular, non-labored and within target range.. Temperature is normal and within the target range for the patient.. Patient looks fatigued, states this is because her hemoglobin is low and she is going for a transfusion tomorrow. Vitals Time Taken: 4:15 PM, Height: 68 in, Weight: 96 lbs, BMI: 14.6, Pulse: 75 bpm, Respiratory Rate: 16 breaths/min, Blood Pressure: 135/98 mmHg. General Notes: Wound exam #1 small area on the lateral aspect of her left transmetatarsal amputation site is debrided with a curet of surface slough nonviable subcutaneous tissue this cleans up fairly well. #2 her left heel wound is roughly 50% epithelialized 50% open. We will honor Dr. Eliane Decree request for Xeroform and a foam cover. #3 deep area over her left greater trochanter. This is a deep wound however the surface appears clean. I think a wound VAC would be reasonable with collagen under the foam #4 stage III wound in the right popliteal fossa from pressure of the toilet seat. Again I think this is clean Santyl was done his job in this area I would like to try Prisma to this as well Integumentary (Hair, Skin) Wound #16 status is Open. Original cause of wound was Pressure Injury. 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 The wound measures 2.5cm length x 2cm width x 0.3cm depth; 3.927cm^2 area and 1.178cm^3 volume. There is fat exposed. There is no tunneling noted,  however, there is undermining starting at 12:00 and ending at 5:00 with a maximum distance of 0.5cm. There is a large amount of serosanguineous drainage noted. The wound margin is distinct with the outline attached to the wound base. There is medium (34-66%) red, pink granulation within the wound bed. There is a medium (34-66%) amount of necrotic tissue within the wound bed including Eschar and Adherent Slough. The periwound skin appearance exhibited: Localized Edema, Erythema. The periwound  skin appearance did not exhibit: Callus, Crepitus, Excoriation, Fluctuance, Friable, Induration, Rash, Scarring, Dry/Scaly, Maceration, Moist, Atrophie Blanche, Cyanosis, Ecchymosis, Hemosiderin Staining, Mottled, Pallor, Rubor. The surrounding wound skin color is noted with erythema which is circumferential. Periwound temperature was noted as No Abnormality. The periwound has tenderness on palpation. Wound #17 status is Open. Original cause of wound was Surgical Injury. The wound is located on the Left Amputation Site - Transmetatarsal. The wound measures 1cm length x 0.9cm width x 0.6cm depth; 0.707cm^2 area and 0.424cm^3 volume. The wound is limited to skin breakdown. There is no tunneling or undermining noted. There is a medium amount of serous drainage noted. The wound margin is flat and intact. There is small (1-33%) red granulation within the wound bed. There is a large (67-100%) amount of necrotic tissue within the wound bed including Adherent Slough. The periwound skin appearance exhibited: Moist. The periwound skin appearance did not exhibit: Callus, Crepitus, Excoriation, Fluctuance, Friable, Induration, Localized Edema, Rash, Scarring, Dry/Scaly, Maceration, Atrophie Blanche, Cyanosis, Ecchymosis, Hemosiderin Staining, Mottled, Pallor, Rubor, Erythema. Wound #18 status is Open. Original cause of wound was Pressure Injury. The wound is located on the Left Ischial Tuberosity. The wound measures 3cm  length x 2.4cm width x 2cm depth; 5.655cm^2 area and 11.31cm^3 volume. The wound is limited to skin breakdown. There is no tunneling or undermining noted. There is a large amount of serous drainage noted. The wound margin is flat and intact. There is large (67- 100%) pink granulation within the wound bed. There is a small (1-33%) amount of necrotic tissue within the wound bed including Eschar and Adherent Slough. The periwound skin appearance exhibited: Moist, Erythema. The periwound skin appearance did not exhibit: Callus, Crepitus, Excoriation, Fluctuance, Friable, Induration, Localized Edema, Rash, Scarring, Dry/Scaly, Maceration, Atrophie Blanche, Cyanosis, Ecchymosis, Hemosiderin Staining, Mottled, Pallor, Rubor. The surrounding wound skin color is noted with Metzgar, Loribeth N. ([id number]) erythema which is circumferential. Wound #9 status is Open. Original cause of wound was Pressure Injury. The wound is located on the Left Calcaneous. The wound measures 4.5cm length x 3.2cm width x 0.1cm depth; 11.31cm^2 area and 1.131cm^3 volume. The wound is limited to skin breakdown. There is no tunneling or undermining noted. There is a none present amount of drainage noted. The wound margin is flat and intact. There is medium (34-66%) red granulation within the wound bed. There is a medium (34-66%) amount of necrotic tissue within the wound bed including Eschar and Adherent Slough. The periwound skin appearance exhibited: Moist. The periwound skin appearance did not exhibit: Callus, Crepitus, Excoriation, Fluctuance, Friable, Induration, Localized Edema, Rash, Scarring, Dry/Scaly, Maceration, Atrophie Blanche, Cyanosis, Ecchymosis, Hemosiderin Staining, Mottled, Pallor, Rubor, Erythema. Assessment Active Problems ICD-10 E10.621 - Type 1 diabetes mellitus with foot ulcer E10.52 - Type 1 diabetes mellitus with diabetic peripheral angiopathy with gangrene I70.245 - Atherosclerosis of native  arteries of left leg with ulceration of other part of foot I70.261 - Atherosclerosis of native arteries of extremities with gangrene, right leg W09.811 - Pressure ulcer of left heel, stage 2 Z89.511 - Acquired absence of right leg below knee T87.53 - Necrosis of amputation stump, right lower extremity S71.101A - Unspecified open wound, right thigh, initial encounter B14.782 - Pressure ulcer of left buttock, stage 2 Procedures Wound #17 Wound #17 is an Open Surgical Wound located on the Left Amputation Site - Transmetatarsal . There was a Skin/Subcutaneous Tissue Debridement ([id number]) debridement with total area of 0.9 sq cm performed by [name], MD. with the following instrument(s):  Curette to remove Viable and Non-Viable tissue/material including Fibrin/Slough, Eschar, and Subcutaneous after achieving pain control using Lidocaine 4% Topical Solution. A time out was conducted at 16:38, prior to the start of the procedure. A Minimum amount of bleeding was controlled with Pressure. The procedure was tolerated well with a pain level of 0 throughout and a pain level of 0 following the procedure. Post Debridement Measurements: 1cm length x 0.9cm width x 1cm depth; 0.707cm^3 volume. Character of Wound/Ulcer Post Debridement is improved. Severity of Tissue Post Debridement is: Fat layer exposed. Post procedure Diagnosis Wound #17: Same as Pre-Procedure Bennison, Charisa N. ([id number]) Plan Wound Cleansing: 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 Cleanse wound with mild soap and water Wound #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: Clean wound with Normal Saline. Cleanse wound with mild soap and water Wound #18 Left Ischial Tuberosity: Clean wound with Normal Saline. Cleanse wound with mild soap and water Wound #9 Left Calcaneous: Clean wound with Normal Saline. Cleanse wound with mild soap and water Anesthetic: Wound #16  Right,Posterior Amputation Site - Below Knee: Topical Lidocaine 4% cream applied to wound bed prior to debridement Wound #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: Topical Lidocaine 4% cream applied to wound bed prior to debridement Wound #18 Left Ischial Tuberosity: Topical Lidocaine 4% cream applied to wound bed prior to debridement Wound #9 Left Calcaneous: Topical Lidocaine 4% cream applied to wound bed prior to debridement Primary Wound Dressing: 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,Posterior Amputation Site - Below Knee: Prisma Ag - or collagen with silver equivalent Wound #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: Prisma Ag - or collagen with silver equivalent Wound #18 Left Ischial Tuberosity: Prisma Ag - or collagen with silver equivalent Wound #9 Left Calcaneous: Xeroform Secondary Dressing: 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,Posterior Amputation Site - Below Knee: Boardered Foam Dressing Wound #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: Gauze, ABD and Kerlix/Conform - wrap lower leg with ace wrap Wound #18 Left Ischial Tuberosity: Boardered Foam Dressing Wound #9 Left Calcaneous: Gauze, ABD and Kerlix/Conform - wrap lower leg with ace wrap Dressing Change Frequency: 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,Posterior Amputation Site - Below Knee: Lemire, Jarelis N. ([id number]) Change dressing every day. Wound #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: Change dressing every day. Wound #18 Left Ischial Tuberosity: Change dressing every day. Wound #9 Left Calcaneous: Change dressing every day. 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 Other: - as patient is able Wound #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: Return Appointment in 2 weeks. Other: - as patient is able Wound #18 Left Ischial Tuberosity: Return Appointment in 2 weeks. Other: - as patient is able Wound #9 Left Calcaneous: Return Appointment in 2 weeks. Other: - as patient is able Home  Health: 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,Posterior Amputation Site - Below Knee: Continue Home Health Visits Home Health Nurse may visit PRN to address patient s wound care needs. FACE TO FACE ENCOUNTER: MEDICARE and MEDICAID PATIENTS: I certify that this patient is under my care and that I had a face-to-face encounter that meets the physician face-to-face encounter requirements with this patient on this date. The encounter with the patient was in whole or in part for the following MEDICAL CONDITION: (primary reason for Home Healthcare) MEDICAL NECESSITY: I certify, that based on my findings, NURSING services are a medically necessary home health service. HOME BOUND STATUS: I certify that my clinical findings support that this patient is homebound (i.e., Due to illness or injury, pt  requires aid of supportive devices such as crutches, cane, wheelchairs, walkers, the use of special transportation or the assistance of another person to leave their place of residence. There is a normal inability to leave the home and doing so requires considerable and taxing effort. Other absences are for medical reasons / religious services and are infrequent or of short duration when for other reasons). If current dressing causes regression in wound condition, may D/C ordered dressing product/s and apply Normal Saline Moist Dressing daily until next Wound Healing Center / Other MD appointment. Notify Wound Healing Center of regression in wound condition at [phone]. 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 FACE TO FACE ENCOUNTER: MEDICARE and MEDICAID PATIENTS: I certify that this patient is under my care and that I had a face-to-face encounter that meets the physician face-to-face encounter requirements with this  patient on this date. The encounter with the patient was in whole or in part for the following MEDICAL CONDITION: (primary reason for Home Healthcare) MEDICAL NECESSITY: I certify, that based on my findings, NURSING services are a medically necessary home health service. HOME BOUND STATUS: I certify that my clinical findings support that this patient is homebound (i.e., Due to illness or injury, pt requires aid of supportive devices such as crutches, cane, wheelchairs, walkers, the use of special transportation or the assistance of another person to leave their place of residence. There is a normal inability to leave the home and doing so requires considerable and taxing effort. Other absences are for medical reasons / religious services and are infrequent or of short duration when for other reasons). JENELLE, DRENNON ([id number]) If current dressing causes regression in wound condition, may D/C ordered dressing product/s and apply Normal Saline Moist Dressing daily until next Wound Healing Center / Other MD appointment. Notify Wound Healing Center of regression in wound condition at [phone]. Please direct any NON-WOUND related issues/requests for orders to patient's Primary Care Physician Wound #18 Left Ischial Tuberosity: Continue Home Health Visits Home Health Nurse may visit PRN to address patient s wound care needs. FACE TO FACE ENCOUNTER: MEDICARE and MEDICAID PATIENTS: I certify that this patient is under my care and that I had a face-to-face encounter that meets the physician face-to-face encounter requirements with this patient on this date. The encounter with the patient was in whole or in part for the following MEDICAL CONDITION: (primary reason for Home Healthcare) MEDICAL NECESSITY: I certify, that based on my findings, NURSING services are a medically necessary home health service. HOME BOUND STATUS: I certify that my clinical findings support that this patient is  homebound (i.e., Due to illness or injury, pt requires aid of supportive devices such as crutches, cane, wheelchairs, walkers, the use of special transportation or the assistance of another person to leave their place of residence. There is a normal inability to leave the home and doing so requires considerable and taxing effort. Other absences are for medical reasons / religious services and are infrequent or of short duration when for other reasons). If current dressing causes regression in wound condition, may D/C ordered dressing product/s and apply Normal Saline Moist Dressing daily until next Wound Healing Center / Other MD appointment. Notify Wound Healing Center of regression in wound condition at [phone]. Please direct any NON-WOUND related issues/requests for orders to patient's Primary  Care Physician Wound #9 Left Calcaneous: Continue Home Health Visits Home Health Nurse may visit PRN to address patient s wound care needs. FACE TO FACE ENCOUNTER: MEDICARE and MEDICAID PATIENTS: I certify that this patient is under my care and that I had a face-to-face encounter that meets the physician face-to-face encounter requirements with this patient on this date. The encounter with the patient was in whole or in part for the following MEDICAL CONDITION: (primary reason for Home Healthcare) MEDICAL NECESSITY: I certify, that based on my findings, NURSING services are a medically necessary home health service. HOME BOUND STATUS: I certify that my clinical findings support that this patient is homebound (i.e., Due to illness or injury, pt requires aid of supportive devices such as crutches, cane, wheelchairs, walkers, the use of special transportation or the assistance of another person to leave their place of residence. There is a normal inability to leave the home and doing so requires considerable and taxing effort. Other absences are for medical reasons / religious services and are  infrequent or of short duration when for other reasons). If current dressing causes regression in wound condition, may D/C ordered dressing product/s and apply Normal Saline Moist Dressing daily until next Wound Healing Center / Other MD appointment. Notify Wound Healing Center of regression in wound condition at [phone]. Please direct any NON-WOUND related issues/requests for orders to patient's Primary Care Physician Negative Pressure Wound Therapy: Wound #18 Left Ischial Tuberosity: Wound VAC settings at 125/130 mmHg continuous pressure. Use BLACK/GREEN foam to wound cavity. Use WHITE foam to fill any tunnel/s and/or undermining. Change VAC dressing 3 X WEEK. Change canister as indicated when full. Nurse may titrate settings and frequency of dressing changes as clinically indicated. Home Health Nurse may d/c VAC for s/s of increased infection, significant wound regression, or uncontrolled drainage. Notify Wound Healing Center at [phone]. GARNELL, BEGEMAN ([id number]) o o #1 we will monitor Dr. Eliane Decree request for Xeroform gauze to the left heel. #2 Prisma to the area that is open on the lateral aspect of her left transmetatarsal amputation site #3 Prisma to the pressure area in the right popliteal fossa #4 the area over her left greater trochanter appears to be a stable wound although it has considerable depth. Her mother asked about a wound VAC that it recently been taken off the heel I think this is a reasonable suggestion. Until then we will use collagen moist gauze. #5 we all agree that this patient simply would not be able to tolerate hyperbaric oxygen. We discussed this frankly today. Electronic Signature(s) Signed: 07/21/2016 5:12:34 PM By: Baltazar Najjar MD Entered By: Baltazar Najjar on 07/21/2016 16:43:48 [name] ([id number]) -------------------------------------------------------------------------------- Loran Senters Date  of Service: 07/15/2016 Patient Name: N. Patient Account Number: [id number] Medical Record Treating RN: [name] [id number] Number: Other Clinician: 1981-09-08 (35 y.o. 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, right lower extremity S71.101A Unspecified open wound, right thigh, initial encounter L89.322 Pressure ulcer of left buttock,  stage 2 Facility Procedures CPT4 Code: [id number] Description: 11042 - DEB SUBQ TISSUE 20 SQ CM/< ICD-10 Description Diagnosis E10.621 Type 1 diabetes mellitus with foot ulcer Modifier: Quantity: 1 Physician Procedures CPT4 Code: [id number] Description: 11042 - WC PHYS SUBQ TISS 20 SQ CM ICD-10 Description Diagnosis E10.621 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 ([id number]) Entered By: Baltazar Najjarobson, Ayzia Day on 07/21/2016 16:44:16

## 2016-07-22 ENCOUNTER — Encounter: Payer: Medicare Other | Admitting: Internal Medicine

## 2016-07-22 DIAGNOSIS — E10621 Type 1 diabetes mellitus with foot ulcer: Secondary | ICD-10-CM | POA: Diagnosis not present

## 2016-07-23 ENCOUNTER — Other Ambulatory Visit
Admission: RE | Admit: 2016-07-23 | Discharge: 2016-07-23 | Disposition: A | Discharge status: Home / Self Care | Payer: Medicare Other | Source: Ambulatory Visit | Attending: Internal Medicine | Admitting: Internal Medicine

## 2016-07-23 DIAGNOSIS — T8743 Infection of amputation stump, right lower extremity: Secondary | ICD-10-CM | POA: Insufficient documentation

## 2016-07-23 DIAGNOSIS — X58XXXA Exposure to other specified factors, initial encounter: Secondary | ICD-10-CM | POA: Diagnosis not present

## 2016-07-23 NOTE — Progress Notes (Addendum)
HAYLEEN, SHVARTS ([id number]) Visit Report for 07/22/2016 Arrival Information Details SIOUX, ASTUDILLO Date of Service: 07/22/2016 3:45 PM Patient Name: N. Patient Account Number: [id number] Medical Record Treating RN: [name] [id number] Number: Other Clinician: 12-02-1980 (35 y.o. Treating [name], [name] Date of Birth/Sex: Female) Physician/Extender: G Primary Care [name] Physician: Referring Physician: [name] in Treatment: 20 Visit Information History Since Last Visit Added or deleted any medications: No Patient Arrived: Wheel Chair Any new allergies or adverse reactions: No Arrival Time: 15:56 Had a fall or experienced change in No activities of daily living that may affect Accompanied By: aunt risk of falls: Transfer Assistance: Manual Signs or symptoms of abuse/neglect since last No Patient Identification Verified: Yes visito Secondary Verification Process Yes Hospitalized since last visit: No Completed: Pain Present Now: No Patient Requires Transmission-Based No Precautions: Patient Has Alerts: Yes Electronic Signature(s) Signed: 07/22/2016 5:37:04 PM By: [name] Entered By: [name] on 07/22/2016 15:56:49 [name] ([id number]) -------------------------------------------------------------------------------- Encounter Discharge Information Details [name] Date of Service: 07/22/2016 3:45 PM Patient Name: N. Patient Account Number: [id number] Medical Record Treating RN: [name] [id number] Number: Other Clinician: 03/05/1981 (35 y.o. Treating [name], [name] Date of Birth/Sex: Female) Physician/Extender: G Primary Care [name] Physician: Referring Physician: [name] in Treatment: 18 Encounter Discharge Information Items Discharge Pain Level: 0 Discharge Condition: Stable Ambulatory Status: Wheelchair Discharge Destination: Home Transportation: Private  Auto Accompanied By: aunt Schedule Follow-up Appointment: Yes Medication Reconciliation completed and provided to Patient/Care No Neill Jurewicz: Provided on Clinical Summary of Care: 07/22/2016 Form Type Recipient Paper Patient HB Electronic Signature(s) Signed: 07/22/2016 5:31:26 PM By: [name] Previous Signature: 07/22/2016 5:05:35 PM Version By: Gwenlyn Perking Entered By: [name] on 07/22/2016 17:31:26 [name] ([id number]) -------------------------------------------------------------------------------- Multi Wound Chart Details [name] Date of Service: 07/22/2016 3:45 PM Patient Name: N. Patient Account Number: [id number] Medical Record Treating RN: [name] [id number] Number: Other Clinician: [birth year] (35 y.o. 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] [17:Left Amputation Site - Transmetatarsal] [18:Left Ischial Tuberosity] Wounding Event: [16:Pressure Injury] [17:Surgical Injury] [18:Pressure Injury] Primary Etiology: [16:Pressure Ulcer] [17:Open Surgical Wound] [18:Pressure Ulcer] Comorbid History: [16:Anemia, Type I Diabetes, N/A End Stage Renal Disease, Rheumatoid Arthritis, Neuropathy] [18:N/A] Date Acquired: [16:04/02/2016] [17:04/13/2016] [18:05/12/2016] Weeks of Treatment: [16:15] [17:12] [18:7] Wound Status: [16:Open] [17:Open] [18:Open] Measurements L x W x D 2.3x2.3x0.3 [17:1x1x0.5] [18:2.9x2.8x1.2] (cm) Area (cm) : [16:4.155] [17:0.785] [18:6.377] Volume (cm) : [16:1.246] [17:0.393] [18:7.653] %  Reduction in Area: [16:30.00%] [17:6.50%] [18:52.50%] % Reduction in Volume: -109.80% [17:-367.90%] [18:-469.80%] Classification: [16:Category/Stage III] [17:Partial Thickness] [18:Category/Stage II] HBO Classification: [16:Grade 1] [17:N/A] [18:N/A] Exudate Amount: [16:Large] [17:N/A] [18:N/A] Exudate Type: [16:Serosanguineous] [17:N/A] [18:N/A] Exudate Color: [16:red, brown] [17:N/A] [18:N/A] Wound Margin: [16:Distinct, outline attached N/A] [18:N/A] Granulation Amount: [16:Large (67-100%)] [17:N/A] [18:N/A] Granulation Quality: [16:Red, Pink] [17:N/A] [18:N/A] Necrotic Amount: Small (1-33%) N/A N/A Necrotic Tissue: Eschar, Adherent Slough N/A N/A Exposed Structures: Fat: Yes N/A N/A Epithelialization: None N/A N/A Periwound Skin Texture: Edema: Yes No Abnormalities Noted No Abnormalities Noted Excoriation: No Induration: No Callus: No Crepitus: No Fluctuance: No Friable: No Rash: No Scarring: No Periwound Skin Maceration: No No Abnormalities Noted No Abnormalities Noted Moisture: Moist: No Dry/Scaly: No  Periwound Skin Color: Erythema: Yes No Abnormalities Noted No Abnormalities Noted Atrophie Blanche: No Cyanosis: No Ecchymosis: No Hemosiderin Staining: No Mottled: No Pallor: No Rubor: No Erythema Location: Circumferential N/A N/A Erythema Change: No Change N/A N/A Temperature: No Abnormality N/A N/A Tenderness on Yes No No Palpation: Wound Preparation: Ulcer Cleansing: N/A N/A Rinsed/Irrigated with Saline Topical Anesthetic Applied: None Wound Number: [id number] N/A Photos: No Photos No Photos N/A Wound Location: Right Amputation Site - Left Calcaneous N/A Below Knee - Lateral Wounding Event: Gradually Appeared Pressure Injury N/A Primary Etiology: Diabetic Wound/Ulcer of Diabetic Wound/Ulcer of N/A the Lower Extremity the Lower Extremity Comorbid History: Anemia, Type I Diabetes, N/A N/A End Stage Renal Disease, Rheumatoid Arthritis, Neuropathy Date Acquired:  07/22/2016 07/28/2015 N/A Weeks of Treatment: 0 46 N/A Wound Status: Open Open N/A ORLINDA, SLOMSKI N. ([id number]) Measurements L x W x D 0.2x0.2x0.1 4x2.7x0.1 N/A (cm) Area (cm) : 0.031 8.482 N/A Volume (cm) : 0.003 0.848 N/A % Reduction in Area: N/A 54.30% N/A % Reduction in Volume: N/A 54.30% N/A Classification: Grade 2 Grade 2 N/A HBO Classification: N/A N/A N/A Exudate Amount: Large N/A N/A Exudate Type: Purulent N/A N/A Exudate Color: yellow, brown, green N/A N/A Wound Margin: Flat and Intact N/A N/A Granulation Amount: Large (67-100%) N/A N/A Granulation Quality: Red N/A N/A Necrotic Amount: None Present (0%) N/A N/A Necrotic Tissue: N/A N/A N/A Exposed Structures: Fascia: No N/A N/A Fat: No Tendon: No Muscle: No Joint: No Bone: No Limited to Skin Breakdown Epithelialization: None N/A N/A Periwound Skin Texture: Edema: Yes No Abnormalities Noted N/A Excoriation: No Induration: No Callus: No Crepitus: No Fluctuance: No Friable: No Rash: No Scarring: No Periwound Skin Maceration: No No Abnormalities Noted N/A Moisture: Moist: No Dry/Scaly: No Periwound Skin Color: Erythema: Yes No Abnormalities Noted N/A Atrophie Blanche: No Cyanosis: No Ecchymosis: No Hemosiderin Staining: No Mottled: No Pallor: No Rubor: No Erythema Location: Circumferential N/A N/A Erythema Change: N/A N/A N/A Temperature: No Abnormality N/A N/A Tenderness on No No N/A Palpation: Mcfarlane, Jezlyn N. ([id number]) Wound Preparation: Ulcer Cleansing: N/A N/A Rinsed/Irrigated with Saline Topical Anesthetic Applied: Other: lidocaine 4% Treatment Notes Electronic Signature(s) Signed: 07/22/2016 5:37:04 PM By: [name] Entered By: [name] on 07/22/2016 16:40:07 [name] ([id number]) -------------------------------------------------------------------------------- Multi-Disciplinary Care Plan Details [name] Date of Service: 07/22/2016  3:45 PM Patient Name: N. Patient Account Number: [id number] Medical Record Treating RN: [name] [id number] Number: Other Clinician: April 12, 1981 (35 y.o. Treating [name], [name] Date of Birth/Sex: Female) Physician/Extender: G Primary Care [name] Physician: Referring Physician: [name] in Treatment: 58 Active Inactive HBO Nursing Diagnoses: Anxiety related to feelings of confinement associated with the hyperbaric oxygen chamber Anxiety related to knowledge deficit of hyperbaric oxygen therapy and treatment procedures Discomfort related to temperature and humidity changes inside hyperbaric chamber Potential for barotraumas to ears, sinuses, teeth, and lungs or cerebral gas embolism related to changes in atmospheric pressure inside hyperbaric oxygen chamber Potential for oxygen toxicity seizures related to delivery of 100% oxygen at an increased atmospheric pressure Potential for pulmonary oxygen toxicity related to delivery of 100% oxygen at an increased atmospheric pressure Goals: Barotrauma will be prevented during HBO2 Date Initiated: 08/30/2015 Goal Status: Active Patient and/or family will be able to state/discuss factors appropriate to the management of their disease process during treatment Date Initiated: 08/30/2015 Goal Status: Active Patient will tolerate the hyperbaric oxygen therapy treatment Date Initiated: 08/30/2015 Goal Status: Active Patient will tolerate the internal climate of the chamber Date Initiated:  08/30/2015 Goal Status: Active Patient/caregiver will verbalize understanding of HBO goals, rationale, procedures and potential hazards Date Initiated: 08/30/2015 Goal Status: Active Signs and symptoms of pulmonary oxygen toxicity will be recognized and promptly addressed Date Initiated: 08/30/2015 [name] ([id number]) Goal Status: Active Signs and symptoms of seizure will be recognized and promptly addressed ; seizing  patients will suffer no harm Date Initiated: 08/30/2015 Goal Status: Active Interventions: Administer a five (5) minute air break for patient if signs and symptoms of seizure appear and notify the hyperbaric physician Administer a ten (10) minute air break for patient if signs and symptoms of seizure appear and notify the hyperbaric physician Administer decongestants, per physician orders, prior to HBO2 Administer the correct therapeutic gas delivery based on the patients needs and limitations, per physician order Assess and provide for patientos comfort related to the hyperbaric environment and equalization of middle ear Assess for signs and symptoms related to adverse events, including but not limited to confinement anxiety, pneumothorax, oxygen toxicity and baurotrauma Assess patient for any history of confinement anxiety Assess patient's knowledge and expectations regarding hyperbaric medicine and provide education related to the hyperbaric environment, goals of treatment and prevention of adverse events Implement protocols to decrease risk of pneumothorax in high risk patients Notes: Abuse / Safety / Falls / Self Care Management Nursing Diagnoses: Potential for falls Goals: Patient will remain injury free Date Initiated: 09/19/2015 Goal Status: Active Patient/caregiver will verbalize/demonstrate measures taken to prevent injury and/or falls Date Initiated: 09/19/2015 Goal Status: Active Interventions: Assess fall risk on admission and as needed Assess impairment of mobility on admission and as needed per policy Notes: Orientation to the Wound Care Program Nursing Diagnoses: INDRA, WOLTERS ([id number]) Knowledge deficit related to the wound healing center program Goals: Patient/caregiver will verbalize understanding of the Wound Healing Center Program Date Initiated: 08/30/2015 Goal Status: Active Interventions: Provide education on orientation to the wound  center Notes: Pressure Nursing Diagnoses: Knowledge deficit related to causes and risk factors for pressure ulcer development Knowledge deficit related to management of pressures ulcers Potential for impaired tissue integrity related to pressure, friction, moisture, and shear Goals: Patient will remain free from development of additional pressure ulcers Date Initiated: 08/30/2015 Goal Status: Active Patient will remain free of pressure ulcers Date Initiated: 08/30/2015 Goal Status: Active Patient/caregiver will verbalize risk factors for pressure ulcer development Date Initiated: 08/30/2015 Goal Status: Active Patient/caregiver will verbalize understanding of pressure ulcer management Date Initiated: 08/30/2015 Goal Status: Active Interventions: Assess: immobility, friction, shearing, incontinence upon admission and as needed Assess offloading mechanisms upon admission and as needed Assess potential for pressure ulcer upon admission and as needed Provide education on pressure ulcers Treatment Activities: Patient referred for home evaluation of offloading devices/mattresses : 01/15/2016 Patient referred for pressure reduction/relief devices : 01/15/2016 Pressure reduction/relief device ordered : 01/15/2016 Notes: DREYAH, MONTROSE ([id number]) Wound/Skin Impairment Nursing Diagnoses: Impaired tissue integrity Knowledge deficit related to ulceration/compromised skin integrity Goals: Patient will have a decrease in wound volume by X% from date: (specify in notes) Date Initiated: 08/30/2015 Goal Status: Active Patient/caregiver will verbalize understanding of skin care regimen Date Initiated: 08/30/2015 Goal Status: Active Ulcer/skin breakdown will have a volume reduction of 30% by week 4 Date Initiated: 08/30/2015 Goal Status: Active Ulcer/skin breakdown will have a volume reduction of 50% by week 8 Date Initiated: 08/30/2015 Goal Status: Active Ulcer/skin breakdown will have a  volume reduction of 80% by week 12 Date Initiated: 08/30/2015 Goal Status: Active Ulcer/skin breakdown will heal within 14  weeks Date Initiated: 08/30/2015 Goal Status: Active Interventions: Assess patient/caregiver ability to obtain necessary supplies Assess patient/caregiver ability to perform ulcer/skin care regimen upon admission and as needed Assess ulceration(s) every visit Provide education on ulcer and skin care Notes: Electronic Signature(s) Signed: 07/22/2016 5:37:04 PM By: [name] Entered By: [name] on 07/22/2016 16:39:59 [name] ([id number]) -------------------------------------------------------------------------------- Pain Assessment Details [name] Date of Service: 07/22/2016 3:45 PM Patient Name: N. Patient Account Number: [id number] Medical Record Treating RN: [name] [id number] Number: Other Clinician: 01/02/1981 (35 y.o. Treating [name], [name] Date of Birth/Sex: Female) Physician/Extender: G Primary Care [name] Physician: Referring Physician: [name] in Treatment: 38 Active Problems Location of Pain Severity and Description of Pain Patient Has Paino No Site Locations Pain Management and Medication Current Pain Management: Notes Topical or injectable lidocaine is offered to patient for acute pain when surgical debridement is performed. If needed, Patient is instructed to use over the counter pain medication for the following 24-48 hours after debridement. Wound care MDs do not prescribed pain medications. Patient has chronic pain or uncontrolled pain. Patient has been instructed to make an appointment with their Primary Care Physician for pain management. Electronic Signature(s) Signed: 07/22/2016 5:37:04 PM By: [name] Entered By: [name] on 07/22/2016 15:56:57 [name]  ([id number]) -------------------------------------------------------------------------------- Patient/Caregiver Education Details [name] Date of Service: 07/22/2016 3:45 PM Patient Name: N. Patient Account Number: [id number] Medical Record Treating RN: [name] [id number] Number: Other Clinician: [birth year] (35 y.o. Treating [name], [name] Date of Birth/Gender: Female) Physician/Extender: G Primary Care Weeks in Treatment: 53 [name] Physician: Referring Physician: [name] Education Assessment Education Provided To: Patient and Caregiver Education Topics Provided Wound/Skin Impairment: Handouts: Other: antifungal cream to periwound skin Methods: Demonstration, Explain/Verbal Responses: State content correctly Electronic Signature(s) Signed: 07/22/2016 5:37:04 PM By: [name] Entered By: [name] on 07/22/2016 17:31:44 [name] ([id number]) -------------------------------------------------------------------------------- Wound Assessment Details [name] Date of Service: 07/22/2016 3:45 PM Patient Name: N. Patient Account Number: [id number] Medical Record Treating RN: [name] [id number] Number: Other Clinician: 1981/08/24 (35 y.o. 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 Wound Open Status: Wounding Event: Pressure Injury Comorbid Anemia, Type I Diabetes, End Stage Date Acquired: 04/02/2016 History: Renal Disease, Rheumatoid Arthritis, Weeks Of Treatment: 15 Neuropathy Clustered Wound: No Photos Wound Measurements Length: (cm) 2.3 Width: (cm) 2.3 Depth: (cm) 0.3 Area: (cm) 4.155 Volume: (cm) 1.246 % Reduction in Area: 30% % Reduction  in Volume: -109.8% Epithelialization: None Tunneling: No Undermining: No Wound Description Classification: Category/Stage III Diabetic Severity [address]): Grade 1 Wound Margin: Distinct, outline attach Exudate Amount: Large Exudate Type: Serosanguineous Exudate Color: red, brown Foul Odor After Cleansing: No ed Wound Bed Hinds, Jaylena N. ([id number]) Granulation Amount: Large (67-100%) Exposed Structure Granulation Quality: Red, Pink Fat Layer Exposed: Yes Necrotic Amount: Small (1-33%) Necrotic Quality: Eschar, Adherent Slough Periwound Skin Texture Texture Color No Abnormalities Noted: No No Abnormalities Noted: No Callus: No Atrophie Blanche: No Crepitus: No Cyanosis: No Excoriation: No Ecchymosis: No Fluctuance: No Erythema: Yes Friable: No Erythema Location: Circumferential Induration: No Erythema Change: No Change Localized Edema: Yes Hemosiderin Staining: No Rash: No Mottled: No Scarring: No Pallor: No Rubor: No Moisture No Abnormalities Noted: No Temperature / Pain Dry / Scaly: No Temperature: No Abnormality Maceration: No Tenderness on  Palpation: Yes Moist: No Wound Preparation Ulcer Cleansing: Rinsed/Irrigated with Saline Topical Anesthetic Applied: None Treatment Notes 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 Cleansed with: Clean wound with Normal Saline 2. Anesthetic Topical Lidocaine 4% cream to wound bed prior to debridement 3. Peri-wound Care: Antifungal cream 4. Dressing Applied: Prisma Ag 5. Secondary Dressing Applied Guaze, ABD and kerlix/Conform 7. Secured with Magazine features editoraper tape Electronic Signature(s) Signed: 07/23/2016 5:14:40 PM By: Curtis Sitesorthy, Joanna Previous Signature: 07/22/2016 5:37:04 PM Version By: Curtis Sitesorthy, Joanna Entered By: Curtis Sitesorthy, Joanna on 07/23/2016 16:18:30 Diana EvesBLACKBURN, Massa N. ([id number]) Simeon CraftBLACKBURN, Ledell PeoplesHEATHER N.  ([id number]) -------------------------------------------------------------------------------- Wound Assessment Details Shanon PayorBLACKBURN, Nevaen Date of Service: 07/22/2016 3:45 PM Patient Name: N. Patient Account Number: [id number] Medical Record Treating RN: Curtis SitesDorthy, Joanna [id number] Number: Other Clinician: July 08, 1981 (35 y.o. Treating [name], [name] Date of Birth/Sex: Female) Physician/Extender: G Primary Care Rolin Barrylmedo, Mario Physician: Referring Physician: [name] Partridgelmedo, Mario Weeks in Treatment: 46 Wound Status Wound Number: 17 Primary Open Surgical Wound Etiology: Wound Location: Left Amputation Site - Transmetatarsal Wound Open Status: Wounding Event: Surgical Injury Comorbid Anemia, Type I Diabetes, End Stage Date Acquired: 04/13/2016 History: Renal Disease, Rheumatoid Arthritis, Weeks Of Treatment: 12 Neuropathy Clustered Wound: No Photos Wound Measurements Length: (cm) 1 Width: (cm) 1 Depth: (cm) 0.5 Area: (cm) 0.785 Volume: (cm) 0.393 % Reduction in Area: 6.5% % Reduction in Volume: -367.9% Epithelialization: None Wound Description Classification: Partial Thickness Foul Odor Aft Diabetic Severity [address]([place]): Grade 1 Wound Margin: Flat and Intact Exudate Amount: Medium Exudate Type: Serous Exudate Color: amber er Cleansing: No Wound Bed Meeuwsen, Tenya N. ([id number]) Granulation Amount: Small (1-33%) Exposed Structure Granulation Quality: Red Fascia Exposed: No Necrotic Amount: Large (67-100%) Fat Layer Exposed: No Necrotic Quality: Adherent Slough Tendon Exposed: No Muscle Exposed: No Joint Exposed: No Bone Exposed: No Limited to Skin Breakdown Periwound Skin Texture Texture Color No Abnormalities Noted: No No Abnormalities Noted: No Callus: No Atrophie Blanche: No Crepitus: No Cyanosis: No Excoriation: No Ecchymosis: No Fluctuance: No Erythema: No Friable: No Hemosiderin Staining: No Induration: No Mottled: No Localized Edema: No Pallor:  No Rash: No Rubor: No Scarring: No Moisture No Abnormalities Noted: No Dry / Scaly: No Maceration: No Moist: Yes Wound Preparation Ulcer Cleansing: Rinsed/Irrigated with Saline Topical Anesthetic Applied: Other: lidocaine 4%, Treatment Notes 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 Cleansed with: Clean wound with Normal Saline 2. Anesthetic Topical Lidocaine 4% cream to wound bed prior to debridement 4. Dressing Applied: Prisma Ag 5. Secondary Dressing Applied Guaze, ABD and kerlix/Conform Electronic Signature(s) Signed: 07/23/2016 5:14:40 PM By: Curtis Sitesorthy, Joanna Previous Signature: 07/22/2016 5:37:04 PM Version By: Curtis Sitesorthy, Joanna Entered By: Curtis Sitesorthy, Joanna on 07/23/2016 16:19:20 Diana EvesBLACKBURN, Masie N. ([id number]) Simeon CraftBLACKBURN, Ledell PeoplesHEATHER N. ([id number]) -------------------------------------------------------------------------------- Wound Assessment Details Shanon PayorBLACKBURN, Mechelle Date of Service: 07/22/2016 3:45 PM Patient Name: N. Patient Account Number: [id number] Medical Record Treating RN: Curtis SitesDorthy, Joanna [id number] Number: Other Clinician: July 08, 1981 (35 y.o. Treating [name], [name] Date of Birth/Sex: Female) Physician/Extender: G Primary Care Rolin Barrylmedo, Mario Physician: Referring Physician: [name] Partridgelmedo, Mario Weeks in Treatment: 46 Wound Status Wound Number: 18 Primary Pressure Ulcer Etiology: Wound Location: Left Ischial Tuberosity Wound Open Wounding Event: Pressure Injury Status: Date Acquired: 05/12/2016 Comorbid Anemia, Type I Diabetes, End Stage Weeks Of Treatment: 7 History: Renal Disease, Rheumatoid Arthritis, Clustered Wound: No Neuropathy Photos Wound Measurements Length: (cm) 2.9 Width: (cm) 2.8 Depth: (cm) 1.2 Area: (cm) 6.377 Volume: (cm) 7.653 % Reduction in Area: 52.5% % Reduction in Volume: -469.8% Epithelialization: Small (1-33%) Wound Description Classification: Category/Stage II Wound Margin: Flat and  Intact Exudate Amount:  Large Exudate Type: Serous Exudate Color: amber Wound Bed Granulation Amount: Large (67-100%) Exposed Structure Horger, Dymon N. ([id number]) Granulation Quality: Pink Fascia Exposed: No Necrotic Amount: Small (1-33%) Fat Layer Exposed: No Necrotic Quality: Eschar, Adherent Slough Tendon Exposed: No Muscle Exposed: No Joint Exposed: No Bone Exposed: No Limited to Skin Breakdown Periwound Skin Texture Texture Color No Abnormalities Noted: No No Abnormalities Noted: No Callus: No Atrophie Blanche: No Crepitus: No Cyanosis: No Excoriation: No Ecchymosis: No Fluctuance: No Erythema: Yes Friable: No Erythema Location: Circumferential Induration: No Hemosiderin Staining: No Localized Edema: No Mottled: No Rash: No Pallor: No Scarring: No Rubor: No Moisture No Abnormalities Noted: No Dry / Scaly: No Maceration: No Moist: Yes Wound Preparation Ulcer Cleansing: Rinsed/Irrigated with Saline Topical Anesthetic Applied: Other: lidocaine 4%, Treatment Notes Wound #18 (Left Ischial Tuberosity) 1. Cleansed with: Clean wound with Normal Saline 2. Anesthetic Topical Lidocaine 4% cream to wound bed prior to debridement 4. Dressing Applied: Prisma Ag 5. Secondary Dressing Applied Guaze, ABD and kerlix/Conform 7. Secured with Secretary/administrator) Signed: 07/23/2016 5:14:40 PM By: [name] Previous Signature: 07/22/2016 5:37:04 PM Version By: Donella Stade, Ledell Peoples ([id number]) Entered By: [name] on 07/23/2016 16:20:01 [name] ([id number]) -------------------------------------------------------------------------------- Wound Assessment Details [name] Date of Service: 07/22/2016 3:45 PM Patient Name: N. Patient Account Number: [id number] Medical Record Treating RN: [name] [id number] Number: Other Clinician: 10/18/1980 (35 y.o. 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 Wound Open Status: Wounding Event: Gradually Appeared Comorbid Anemia, Type I Diabetes, End Stage Date Acquired: 07/22/2016 History: Renal Disease, Rheumatoid Arthritis, Weeks Of Treatment: 0 Neuropathy Clustered Wound: No Photos Wound Measurements Length: (cm) 0.2 Width: (cm) 0.2 Depth: (cm) 0.1 Area: (cm) 0.031 Volume: (cm) 0.003 % Reduction in Area: 0% % Reduction in Volume: 0% Epithelialization: None Tunneling: No Undermining: No Wound Description Classification: Grade 2 Wound Margin: Flat and Intact Exudate Amount: Large Exudate Type: Purulent Exudate Color: yellow, brown, green Foul Odor After Cleansing: No Wound Bed Granulation Amount: Large (67-100%) Exposed Structure Lumbra, Greysen N. ([id number]) Granulation Quality: Red Fascia Exposed: No Necrotic Amount: None Present (0%) Fat Layer Exposed: No Tendon Exposed: No Muscle Exposed: No Joint Exposed: No Bone Exposed: No Limited to Skin Breakdown Periwound Skin Texture Texture Color No Abnormalities Noted: No No Abnormalities Noted: No Callus: No Atrophie Blanche: No Crepitus: No Cyanosis: No Excoriation: No Ecchymosis: No Fluctuance: No Erythema: Yes Friable: No Erythema Location: Circumferential Induration: No Hemosiderin Staining: No Localized Edema: Yes Mottled: No Rash: No Pallor: No Scarring: No Rubor: No Moisture Temperature / Pain No Abnormalities Noted: No Temperature: No Abnormality Dry / Scaly: No Maceration: No Moist: No Wound Preparation Ulcer Cleansing: Rinsed/Irrigated with Saline Topical Anesthetic Applied: Other: lidocaine 4%, Treatment Notes Wound #19 (Right, Lateral Amputation Site -  Below Knee) 1. Cleansed with: Clean wound with Normal Saline 2. Anesthetic Topical Lidocaine 4% cream to wound bed prior to debridement 4. Dressing Applied: Other dressing (specify in notes) Notes triple antibiotic ointment and bandage Electronic Signature(s) Signed: 07/23/2016 5:14:40 PM By: [name] Previous Signature: 07/22/2016 5:37:04 PM Version By: [name] Entered By: [name] on 07/23/2016 16:20:50 [name] ([id number]) [place], Ledell Peoples ([id number]) -------------------------------------------------------------------------------- Wound Assessment Details [name] Date of Service: 07/22/2016 3:45  PM Patient Name: N. Patient Account Number: [id number] Medical Record Treating RN: [name] [id number] Number: Other Clinician: 07-14-1981 (35 y.o. Treating [name], [name] Date of Birth/Sex: Female) Physician/Extender: G Primary Care [name] Physician: Referring Physician: [name] in Treatment: 46 Wound Status Wound Number: 9 Primary Diabetic Wound/Ulcer of the Lower Etiology: Extremity Wound Location: Left Calcaneous Wound Open Wounding Event: Pressure Injury Status: Date Acquired: 07/28/2015 Comorbid Anemia, Type I Diabetes, End Stage Weeks Of Treatment: 46 History: Renal Disease, Rheumatoid Arthritis, Clustered Wound: No Neuropathy Photos Wound Measurements Length: (cm) 4 % Reduction in Area Width: (cm) 2.7 % Reduction in Volu Depth: (cm) 0.1 Epithelialization: Area: (cm) 8.482 Volume: (cm) 0.848 : 54.3% me: 54.3% None Wound Description Classification: Grade 2 Foul Odor After Cle Wound Margin: Flat and Intact Exudate Amount: None Present ansing: No Wound Bed Granulation Amount: Medium (34-66%) Exposed Structure Granulation Quality: Red Fascia Exposed: No Necrotic Amount: Medium (34-66%) Fat Layer Exposed: No Rosiles, Miyanna N. ([id number]) Necrotic Quality: Eschar, Adherent  Slough Tendon Exposed: No Muscle Exposed: No Joint Exposed: No Bone Exposed: No Limited to Skin Breakdown Periwound Skin Texture Texture Color No Abnormalities Noted: No No Abnormalities Noted: No Callus: No Atrophie Blanche: No Crepitus: No Cyanosis: No Excoriation: No Ecchymosis: No Fluctuance: No Erythema: No Friable: No Hemosiderin Staining: No Induration: No Mottled: No Localized Edema: No Pallor: No Rash: No Rubor: No Scarring: No Moisture No Abnormalities Noted: No Dry / Scaly: No Maceration: No Moist: Yes Wound Preparation Ulcer Cleansing: Rinsed/Irrigated with Saline Topical Anesthetic Applied: Other: lidocaine 4%, Treatment Notes Wound #9 (Left Calcaneous) 1. Cleansed with: Clean wound with Normal Saline 2. Anesthetic Topical Lidocaine 4% cream to wound bed prior to debridement 4. Dressing Applied: Xeroform 5. Secondary Dressing Applied Guaze, ABD and kerlix/Conform 7. Secured with Tape Notes ace wrap Electronic Signature(s) Signed: 07/23/2016 5:14:40 PM By: [name] Previous Signature: 07/22/2016 5:37:04 PM Version By: Donella Stade, Ledell Peoples ([id number]) Entered By: [name] on 07/23/2016 16:22:45 [name] ([id number]) -------------------------------------------------------------------------------- Vitals Details [name] Date of Service: 07/22/2016 3:45 PM Patient Name: N. Patient Account Number: [id number] Medical Record Treating RN: [name] [id number] Number: Other Clinician: [birth year] (35 y.o. Treating [name], [name] Date of Birth/Sex: Female) Physician/Extender: G Primary Care [name] Physician: Referring Physician: [name] in Treatment: 10 Vital Signs Time Taken: 15:58 Pulse (bpm): 73 Height (in): 68 Respiratory Rate (breaths/min): 16 Weight (lbs): 96 Blood Pressure (mmHg): 98/74 Body Mass Index (BMI): 14.6 Reference Range: 80 - 120 mg /  dl Notes manual bp Electronic Signature(s) Signed: 07/22/2016 5:37:04 PM By: [name] Entered By: [name] on 07/22/2016 16:01:34

## 2016-07-24 NOTE — Progress Notes (Addendum)
[name] ([id number]) Visit Report for 07/22/2016 Chief Complaint Document Details [name] Date of Service: 07/22/2016 3:45 PM Patient Name: N. Patient Account Number: [id number] Medical Record Treating RN: [name] [id number] Number: Other Clinician: 01-12-1981 (35 y.o. [name] Date of Birth/Sex: Female) Physician/Extender: G Primary Care [name] Physician: Referring Physician: [name] Wu in Treatment: 41 Information Obtained from: Patient Chief Complaint Patient in today for treatment of non-healing wound and HBO Treatment. she has just gotten out of hospital this week and is back to resume her hyperbaric oxygen therapy Electronic Signature(s) Signed: 07/22/2016 6:13:30 PM By: [name] Najjar MD Entered By: [name] on 07/22/2016 17:52:41 [name] ([id number]) -------------------------------------------------------------------------------- Debridement Details [name] Date of Service: 07/22/2016 3:45 PM Patient Name: N. Patient Account Number: [id number] Medical Record Treating RN: [name] [id number] Number: Other Clinician: 09/13/1981 (35 y.o. [name] Date of Birth/Sex: Female) Physician/Extender: G Primary Care [name] Physician: Referring Physician: [name] Wu in Treatment: 46 Debridement Performed for Wound #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 Assessment: Performed By: Physician [name] Caul, MD Debridement: Debridement Pre-procedure Yes - 16:37 Verification/Time Out Taken: Start Time: 16:37 Pain Control: Lidocaine 4% Topical Solution Level: Skin/Subcutaneous Tissue Total Area Debrided (L x 1 (cm) x 1 (cm) = 1 (cm) W): Tissue and other Viable, Non-Viable, Exudate, Fibrin/Slough, Subcutaneous material debrided: Instrument: Curette Bleeding: Minimum Hemostasis Achieved: Pressure End Time: 16:39 Procedural Pain:  0 Post Procedural Pain: 0 Response to Treatment: Procedure was tolerated well Post Debridement Measurements of Total Wound Length: (cm) 1 Width: (cm) 1 Depth: (cm) 0.6 Volume: (cm) 0.471 Character of Wound/Ulcer Post Improved Debridement: Severity of Tissue Post Debridement: Fat layer exposed Post Procedure Diagnosis Same as Pre-procedure Electronic Signature(s) [name] ([id number]) Signed: 07/22/2016 6:13:30 PM By: [name] Najjar MD Signed: 07/23/2016 5:14:40 PM By: [name] Previous Signature: 07/22/2016 5:37:04 PM Version By: [name] Entered By: [name] on 07/22/2016 17:52:28 [name] ([id number]) -------------------------------------------------------------------------------- HPI Details [name] Date of Service: 07/22/2016 3:45 PM Patient Name: N. Patient Account Number: [id number] Medical Record Treating RN: [name] [id number] Number: Other Clinician: 01/22/1981 (35 y.o. [name] Date of Birth/Sex: Female) Physician/Extender: G Primary Care [name] Physician: Referring Physician: [name] Wu in Treatment: 69 History of Present Illness Location: dry gangrene both feet and heels Quality: Patient reports No Pain. Severity: Patient states wound are getting worse. Duration: Patient has had the wound for > 56months prior to seeking treatment at the wound center Context: The wound appeared gradually over time Modifying Factors: she has been in and out of hospital over the last 2 months Associated Signs and Symptoms: Patient reports having difficulty standing for long periods. HPI Description: [name] is a 35 y.o. female who presents to our wound center, back in June 2016, referred by her PCP Dr. Zada Wu for nonhealing ulcers on the lateral aspect of the right heel. Of note she has a history of type 1 diabetes mellitus that has been uncontrolled. Past medical history  significant for type 1 diabetes mellitus not controlled, ankylosing spondylitis, anorexia nervosa, irritable bowel syndrome, chronic kidney disease, chronic diarrhea. she then developed gangrene of both feet due to severe peripheral vascular disease and also had gangrene of the tips of her fingers due to upper extremity vascular disease. She was being worked up by vascular surgery at [facility] and at [facility] and has had several procedures done there. She started with hyperbaric oxygen therapy and had a total of  40 treatments the last one being on 06/20/2015. After the initial treatment of hyperbaric oxygen therapy she started having ear problems and had ultimately to use myringotomy tubes and this was done bilaterally. Since then her ears have been doing fine. In late September, she had seen vascular and hand surgeons. since then she's been in [place] at the rec center for surgery involving extensive vascular procedures for the upper extremities. She was then at [facility] with GI bleeds both upper and lower and has been in and out of hospital for that. She has recently been out of hospital for the last week. 09/09/2015 -- she was unable to get here in time to start her hyperbaric oxygen today and hence is only here for a wound care visit. 09/19/2015 -- she has been having vancomycin during her dialysis and continues to have vascular appointments and the procedure is been set for early January. She has been unable to make it for her hemodialysis due to various medical symptoms. 09/30/2014 -- her vancomycin was stopped on 09/25/2015 and the mother has noticed the right foot has started draining for the last 3 days. Addendum: after examining the patient I was able to talk to her primary vascular surgeon Dr. Pernell Dupre at the [facility]. I told him about the necrotic area on the plantar aspect of right foot which is now wet gangrene and he agreed with me that he would admit her at [facility] under her  care and synchronize further treatment. We have discussed her poor prognosis and he and I discussed the need for hospice care [facility], [name] ([id number]) and for sitting down and talking to the patient and her mother and giving them a proper detail of the prognosis. 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 She was worked up with MRIs, abdomen aortogram and placement of left-sided angioplasties were done. After a prolonged hospital she she was discharged home and was told to wear shrinker sock and stump protector and see her surgeon for further instructions regarding wound care and suture removal. Asked to take long-term doxycycline. 01/15/16; this is a patient I haven't seen before although she is been followed by Dr. Meyer Russel in this clinic today. She is a type I diabetic with severe PAD macrovascular disease. She has had a previous BKA. She has dry gangrene of the tips of her fingers which she showed me on the right to. She also has dry gangrene of the left first second and third toes and a portion of her proximal foot around these areas. She is followed by vascular surgery at [facility] and saw them recently they are not going to do surgery as of yet. She has a large black eschar over her heel which is beginning to separate in some areas. As I understand think she is paining these with Betadine. There is been some suggestion about retrying hyperbarics on her although she is still not able to commit to the frequency of treatment that would be necessary to see improvement. She is also on Monday Wednesday  Friday dialysis 01/21/16; the patient returns to see me today with regards to the left heel. Apparently she is  not scheduled for any further attempts at revascularization of the left foot. 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 01/28/16; the patient returns today for attention to the left foot specifically the left calcaneal ulcer. This is covered in a thick black eschar. I crosshatched this last week and we have been using Santyl. 02/05/16; I continue to work on the thick black eschar on the patient's left calcaneus. She is using Santyl that this side crosshatched this area and the eschar is beginning to loosen. 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 This is beginning to separate and liquefy as well especially between the first and second toes. In the time being her major complaint is fatigue at dialysis 02/26/16 I continue to work on the patient's left calcaneus thick adherent black eschar. I crosshatched this area and we have been using Santyl although it is very adherent area I remove some nonviable tissue today what I can see of this actually looks surprisingly good. On the same foot she has dry gangrene on the first and second toes and part of the forefoot underneath this. This is beginning to separate. 03/11/16; the patient comes in for her every 2 week appointment. I have been working on the black eschar on her heel. The patient apparently again has to come off dialysis early today after 2 hours due to severe complaints of nausea. She really does not look well. 04/02/2016 -- the patient has not been seen here for about 3 weeks now and has a new issue with the stump of the right amputation site and also her right posterior thigh. They have only noticed this for the last couple of  days. 04/28/16; I had received a call from the patient's surgeon at [facility]. She had a left transmetatarsal amputation and Integra applied to the left heel. Both of these areas appear to be doing well. There are dressing these with [facility] and they will return to their surgeon on Thursday. She has a new injury on the popliteal fossa on the right which I think was trauma from her stump. 05/20/16; the patient is following with her surgeon at [facility]. She's had a left transmetatarsal debridement of the left heel she had Integra place and apparently is using some consternation of Epson salts soaks, Betadine and Aquacel Ag. The left leg is wrapped I did not look at this today. She has the wound in her right popliteal fossa which is apparently a pressure area possibly related to sitting on a toilet for 2-3 hours multiple times a day. 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 We have been using Santyl to the area on the right popliteal fossa. She has Rinke, Areal N. ([id number]) a new wound on her right gluteal area but the patient would not let me look at that today 06/03/16; patient is not doing particularly well. She now has a pressure area over her left ischial tuberosity. This is of quite a size and covered with an adherent surface slough. She looks as though she has lost weight, I didn't want to go ahead and attempt to debridement this today. There is really no evidence of infection. The area behind the right popliteal fossa looks about the same as 2 weeks ago  we have been using Santyl to this area. I did not look at her left foot which is wrapped been followed by podiatry at [facility] 06/10/16; patient arrives in clinic actually looking a lot brighter than I usually see her, predictably she did not go to dialysis today. For the first time in perhaps 6 weeks I actually saw her left foot today. The transmetatarsal site is healed.  The left heel has a reasonably stable-looking wound which has a clean base. Some eschar superiorly and a few sutures remain in place. More worrisome is an area on her lateral left foot over the metatarsal head. Quarter size necrotic wound that probes to bone. There is some purulence here which I cultured there is nothing that looks like a healthy base of this area. They've been using silver alginate to this area at home. She also has a large wound in the right popliteal fossa, and again a pressure area over the left ischial tuberosity. We are using Santyl to both of these areas. Both allays looks somewhat better than last week, using Santyl to both areas 06/17/16: culture from last week grew citrobacter and amp sensitive enterococcus fecalis. Started on vanc last Saturday at dialysis. have spoken to dialysis in [place] re adjustment in antibiotics added ceftazidine to vanc 06/24/16; the patient was discharged from [facility] yesterday. She had an IandD of the open area on the lateral aspect of her foot. She continues on vancomycin and Ceptaz again at dialysis although I'm not exactly sure of the current duration of this. She also had a debridement of the wound over the left greater trochanter done by the wound care team. She is receiving Santyl based dressings to this and the area in her right popliteal fossa. She arrives today completely fatigued from dialysis. I spoke to Dr. Allena Katz her podiatrist and surgeon at [facility] and asked if we could manage the wound VAC which I think we can. He also wanted to ask about hyperbaric oxygen with the indication of chronic osteomyelitis although at this point I am not completely certain where the chronic osteomyelitis is. Finally she apparently has had a re-graft to the area on her left heel which is either a skin graft or Integra 07/15/16; patient arrives today she is not been in the clinic since I saw her on 9/27. She's been using Santyl to the left greater  trochanter, I popliteal fossa. Her home health nurse remove the wound VAC from the transmetatarsal head, there is only a small wound on the medial aspect of the foot. Dr. Allena Katz who is been managing the heel wounds has requested Xeroform to the heel. 07/22/16; patient arrives as usual postdialysis she is very fatigued and weak looking. She generally tolerates dialysis poorly requiring midodrine tosupport her blood pressure. She continues on vancomycin and I believe ceftazadine at dialysis for chronic osteomyelitis oShe has a wound on the lateral left transmetatarsal amputation. This was debrided of surface slough nonviable subcutaneous tissue the base of this looks healthy and improved. oHer left heel wound is followed by by Dr. Allena Katz at [facility] her surgeon. We have been applying Xeroform to this at his request, he'll oShe has a deep wound over her left greater trochanter however this does not appear to be infected has healthy granulation and I think could be well served by a wound VAC with collagen under the foam oUnder the right popliteal fossa there is a pressure area apparently from a toilet seat. This has been making progress again the tissue  here appears to be healthy we have been using silver collagen in this area as well oThere is a new small area on the lateral aspect of her right stump which appears to be draining purulent material. Our nurses obtained a specimen of this for culture. As noted she is already onVAncomycin e a third generation cephalosporin Electronic Signature(s) Signed: 07/22/2016 6:13:30 PM By: [name] Najjar MD Entered By: [name] on 07/22/2016 17:57:26 [name] ([id number]) [name], [name] ([id number]) -------------------------------------------------------------------------------- Physical Exam Details [name] Date of Service: 07/22/2016 3:45 PM Patient Name: N. Patient Account Number: [id number] Medical Record Treating RN:  [name] [id number] Number: Other Clinician: Jun 08, 1981 (35 y.o. Treating Jaquelin Meaney Date of Birth/Sex: Female) Physician/Extender: G Primary Care [name] Physician: Referring Physician: [name] Wu in Treatment: 61 Constitutional Patient is hypotensive.. Pulse regular and within target range for patient.Marland Kitchen Respirations regular, non-labored and within target range.. Temperature is normal and within the target range for the patient.. Patient's appearance is neat and clean. Appears in no acute distress. Well nourished and well developed.. Integumentary (Hair, Skin) Behind the right popliteal fossa is of fairly clear tinial infection. Notes Wound exam; please see HPI for description of the current wounds. Electronic Signature(s) Signed: 07/22/2016 6:13:30 PM By: [name] Najjar MD Entered By: [name] on 07/22/2016 17:59:37 [name] ([id number]) -------------------------------------------------------------------------------- Physician Orders Details [name] Date of Service: 07/22/2016 3:45 PM Patient Name: N. Patient Account Number: [id number] Medical Record Treating RN: [name] [id number] Number: Other Clinician: [birth year] (35 y.o. Treating Gage Treiber Date of Birth/Sex: Female) Physician/Extender: G Primary Care [name] Physician: Referring Physician: [name] Wu in Treatment: 22 Verbal / Phone Orders: Yes Clinician: Curtis Wu Read Back and Verified: Yes Diagnosis Coding Wound Cleansing 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,Posterior Amputation Site - Below Knee o Clean wound with Normal Saline. o Cleanse wound with mild soap and water Wound #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 o Clean wound with Normal Saline. o Cleanse wound with mild soap and water Wound #18 Left Ischial Tuberosity o Clean wound with Normal Saline. o Cleanse wound with mild soap and water Wound #19 Right,Lateral  Amputation Site - Below Knee o Clean wound with Normal Saline. o Cleanse wound with mild soap and water Wound #9 Left Calcaneous o Clean wound with Normal Saline. 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,Posterior Amputation Site - Below Knee o Topical Lidocaine 4% cream applied to wound bed prior to debridement Wound #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 o Topical Lidocaine 4% cream applied to wound bed prior to debridement Wound #18 Left Ischial Tuberosity o Topical Lidocaine 4% cream applied to wound bed prior to debridement Wound #19 Right,Lateral Amputation Site - Below Knee o Topical Lidocaine 4% cream applied to wound bed prior to debridement Ballantine, Kaiah N. ([id number]) Wound #9 Left Calcaneous o Topical Lidocaine 4% cream applied to wound bed prior to debridement Primary Wound Dressing 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,Posterior Amputation Site - Below Knee o Prisma Ag - or collagen with silver equivalent Wound #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 o Prisma Ag - or collagen with silver equivalent Wound #18 Left Ischial Tuberosity o Prisma Ag - or collagen with silver equivalent Wound #19 Right,Lateral Amputation Site - Below Knee o Other: - triple antibiotic ointment Wound #9 Left Calcaneous o Xeroform Secondary Dressing 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,Posterior Amputation Site - Below Knee o Boardered Foam Dressing Wound #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 o Gauze, ABD and Kerlix/Conform - wrap lower  leg with ace wrap Wound #18 Left Ischial Tuberosity o Boardered Foam Dressing o Boardered Foam Dressing Wound #19 Right,Lateral Amputation Site - Below Knee o Boardered Foam Dressing Wound #9 Left Calcaneous o Gauze, ABD and Kerlix/Conform - wrap lower leg with ace wrap Dressing Change Frequency 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,Posterior Amputation Site - Below Knee o Change dressing every day. Wound #17 Left Amputation Site -  Transmetatarsal o Change dressing every day. Wound #18 Left Ischial Tuberosity o Change dressing every day. Wound #19 Right,Lateral Amputation Site - Below Knee Bohnenkamp, [name] ([id number]) o Change dressing every day. Wound #9 Left Calcaneous o Change dressing every day. Follow-up Appointments 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,Posterior Amputation Site - Below Knee o Return Appointment in 2 weeks. o Other: - as patient is able Wound #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 o Return Appointment in 2 weeks. o Other: - as patient is able Wound #18 Left Ischial Tuberosity o Return Appointment in 2 weeks. o Other: - as patient is able Wound #19 Right,Lateral Amputation Site - Below Knee o Return Appointment in 2 weeks. o Other: - as patient is able Wound #9 Left Calcaneous o Return Appointment in 2 weeks. o Other: - as patient is able Home Health 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,Posterior Amputation Site - Below Knee o Continue Home Health Visits o Home Health Nurse may visit PRN to address patientos wound care needs. o FACE TO FACE ENCOUNTER: MEDICARE and MEDICAID PATIENTS: I certify that this patient is under my care and that I had a face-to-face encounter that meets the physician face-to-face encounter requirements with this patient on this date. The encounter with the patient was in whole or in part for the following MEDICAL CONDITION: (primary reason for Home Healthcare) MEDICAL NECESSITY: I certify, that based on my findings, NURSING services are a medically necessary home health service. HOME BOUND STATUS: I certify that my clinical findings support that this patient is homebound (i.e., Due to illness or injury, pt requires aid of supportive devices such as crutches, cane, wheelchairs, walkers, the use of special transportation or the assistance of another person to leave their place of residence. There is a normal inability to leave the home and doing so requires  considerable and taxing effort. Other absences are for medical reasons / religious services and are infrequent or of short duration when for other reasons). o If current dressing causes regression in wound condition, may D/C ordered dressing product/s and apply Normal Saline Moist Dressing daily until next Wound Healing Center / Other MD appointment. Notify Wound Healing Center of regression in wound condition at [phone]. o Please direct any NON-WOUND related issues/requests for orders to patient's Primary Care Physician Wound #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 [name], [name] ([id number]) o Continue Home Health Visits o Home Health Nurse may visit PRN to address patientos wound care needs. o FACE TO FACE ENCOUNTER: MEDICARE and MEDICAID PATIENTS: I certify that this patient is under my care and that I had a face-to-face encounter that meets the physician face-to-face encounter requirements with this patient on this date. The encounter with the patient was in whole or in part for the following MEDICAL CONDITION: (primary reason for Home Healthcare) MEDICAL NECESSITY: I certify, that based on my findings, NURSING services are a medically necessary home health service. HOME BOUND STATUS: I certify that my clinical findings support that this patient is homebound (i.e., Due to illness or injury, pt requires aid of supportive devices such as crutches, cane, wheelchairs, walkers, the use of special  transportation or the assistance of another person to leave their place of residence. There is a normal inability to leave the home and doing so requires considerable and taxing effort. Other absences are for medical reasons / religious services and are infrequent or of short duration when for other reasons). o If current dressing causes regression in wound condition, may D/C ordered dressing product/s and apply Normal Saline Moist Dressing daily until next Wound Healing  Center / Other MD appointment. Notify Wound Healing Center of regression in wound condition at [phone]. o Please direct any NON-WOUND related issues/requests for orders to patient's Primary Care Physician Wound #18 Left Ischial Tuberosity o Continue Home Health Visits o Home Health Nurse may visit PRN to address patientos wound care needs. o FACE TO FACE ENCOUNTER: MEDICARE and MEDICAID PATIENTS: I certify that this patient is under my care and that I had a face-to-face encounter that meets the physician face-to-face encounter requirements with this patient on this date. The encounter with the patient was in whole or in part for the following MEDICAL CONDITION: (primary reason for Home Healthcare) MEDICAL NECESSITY: I certify, that based on my findings, NURSING services are a medically necessary home health service. HOME BOUND STATUS: I certify that my clinical findings support that this patient is homebound (i.e., Due to illness or injury, pt requires aid of supportive devices such as crutches, cane, wheelchairs, walkers, the use of special transportation or the assistance of another person to leave their place of residence. There is a normal inability to leave the home and doing so requires considerable and taxing effort. Other absences are for medical reasons / religious services and are infrequent or of short duration when for other reasons). o If current dressing causes regression in wound condition, may D/C ordered dressing product/s and apply Normal Saline Moist Dressing daily until next Wound Healing Center / Other MD appointment. Notify Wound Healing Center of regression in wound condition at [phone]. o Please direct any NON-WOUND related issues/requests for orders to patient's Primary Care Physician Wound #19 Right,Lateral Amputation Site - Below Knee o Continue Home Health Visits o Home Health Nurse may visit PRN to address patientos wound care  needs. o FACE TO FACE ENCOUNTER: MEDICARE and MEDICAID PATIENTS: I certify that this patient is under my care and that I had a face-to-face encounter that meets the physician face-to-face encounter requirements with this patient on this date. The encounter with the patient was in whole or in part for the following MEDICAL CONDITION: (primary reason for Home Healthcare) MEDICAL NECESSITY: I certify, that based on my findings, NURSING services are a medically necessary home health service. HOME BOUND STATUS: I certify that my clinical findings [name], Wu ([id number]) support that this patient is homebound (i.e., Due to illness or injury, pt requires aid of supportive devices such as crutches, cane, wheelchairs, walkers, the use of special transportation or the assistance of another person to leave their place of residence. There is a normal inability to leave the home and doing so requires considerable and taxing effort. Other absences are for medical reasons / religious services and are infrequent or of short duration when for other reasons). o If current dressing causes regression in wound condition, may D/C ordered dressing product/s and apply Normal Saline Moist Dressing daily until next Wound Healing Center / Other MD appointment. Notify Wound Healing Center of regression in wound condition at [phone]. o Please direct any NON-WOUND related issues/requests for orders to patient's Primary Care Physician Wound #9  Left Calcaneous o Continue Home Health Visits o Home Health Nurse may visit PRN to address patientos wound care needs. o FACE TO FACE ENCOUNTER: MEDICARE and MEDICAID PATIENTS: I certify that this patient is under my care and that I had a face-to-face encounter that meets the physician face-to-face encounter requirements with this patient on this date. The encounter with the patient was in whole or in part for the following MEDICAL CONDITION: (primary  reason for Home Healthcare) MEDICAL NECESSITY: I certify, that based on my findings, NURSING services are a medically necessary home health service. HOME BOUND STATUS: I certify that my clinical findings support that this patient is homebound (i.e., Due to illness or injury, pt requires aid of supportive devices such as crutches, cane, wheelchairs, walkers, the use of special transportation or the assistance of another person to leave their place of residence. There is a normal inability to leave the home and doing so requires considerable and taxing effort. Other absences are for medical reasons / religious services and are infrequent or of short duration when for other reasons). o If current dressing causes regression in wound condition, may D/C ordered dressing product/s and apply Normal Saline Moist Dressing daily until next Wound Healing Center / Other MD appointment. Notify Wound Healing Center of regression in wound condition at [phone]. o Please direct any NON-WOUND related issues/requests for orders to patient's Primary Care Physician Negative Pressure Wound Therapy Wound #18 Left Ischial Tuberosity o Wound VAC settings at 125/130 mmHg continuous pressure. Use BLACK/GREEN foam to wound cavity. Use WHITE foam to fill any tunnel/s and/or undermining. Change VAC dressing 3 X WEEK. Change canister as indicated when full. Nurse may titrate settings and frequency of dressing changes as clinically indicated. o Home Health Nurse may d/c VAC for s/s of increased infection, significant wound regression, or uncontrolled drainage. Notify Wound Healing Center at [phone]. Laboratory o Bacteria identified in Wound by Culture (MICRO) - right lateral BKA site oooo LOINC Code: 6462-6 oooo Convenience Name: Wound culture routine SIMI, BRIEL ([id number]) Electronic Signature(s) Signed: 07/22/2016 5:37:04 PM By: [name] Signed: 07/22/2016 6:13:30 PM By: [name] Najjar MD Entered By: [name] on 07/22/2016 17:26:24 [name] ([id number]) -------------------------------------------------------------------------------- Problem List Details [name] Date of Service: 07/22/2016 3:45 PM Patient Name: N. Patient Account Number: [id number] Medical Record Treating RN: [name] [id number] Number: Other Clinician: [birth year] (35 y.o. Treating Ricci Dirocco Date of Birth/Sex: Female) Physician/Extender: G Primary Care [name] Physician: Referring Physician: [name] Wu in Treatment: 38 Active Problems ICD-10 Encounter Code Description Active Date Diagnosis E10.621 Type 1 diabetes mellitus with foot ulcer 08/30/2015 Yes E10.52 Type 1 diabetes mellitus with diabetic peripheral 08/30/2015 Yes angiopathy with gangrene I70.245 Atherosclerosis of native arteries of left leg with ulceration 08/30/2015 Yes of other part of foot I70.261 Atherosclerosis of native arteries of extremities with 08/30/2015 Yes gangrene, right leg L89.622 Pressure ulcer of left heel, stage 2 08/30/2015 Yes Z89.511 Acquired absence of right leg below knee 11/29/2015 Yes T87.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, right lower extremity 04/02/2016 Yes S71.101A Unspecified open wound, right thigh, initial encounter 04/02/2016 Yes L89.322 Pressure ulcer of left buttock, stage 2 06/10/2016 Yes Mclester, SHALEN PETRAK ([id number]) Inactive Problems Resolved Problems ICD-10 Code Description Active Date Resolved Date L02.611 Cutaneous abscess of right foot 10/01/2015 10/01/2015 Electronic Signature(s) Signed: 07/22/2016 6:13:30 PM By: [name] Najjar MD Entered By: [name] on 07/22/2016 17:52:10 [name] ([id number]) -------------------------------------------------------------------------------- Progress Note Details [name] Date of Service: 07/22/2016 3:45 PM  Patient Name: N. Patient Account Number: [id number] Medical  Record Treating RN: [name] [id number] Number: Other Clinician: [birth year] (35 y.o. Treating Rhiann Boucher Date of Birth/Sex: Female) Physician/Extender: G Primary Care [name] Physician: Referring Physician: [name] Wu in Treatment: 58 Subjective Chief Complaint Information obtained from Patient Patient in today for treatment of non-healing wound and HBO Treatment. she has just gotten out of hospital this week and is back to resume her hyperbaric oxygen therapy History of Present Illness (HPI) The following HPI elements were documented for the patient's wound: Location: dry gangrene both feet and heels Quality: Patient reports No Pain. Severity: Patient states wound are getting worse. Duration: Patient has had the wound for > 4months prior to seeking treatment at the wound center Context: The wound appeared gradually over time Modifying Factors: she has been in and out of hospital over the last 2 months Associated Signs and Symptoms: Patient reports having difficulty standing for long periods. Shacarra Choe is a 35 y.o. female who presents to our wound center, back in June 2016, referred by her PCP Dr. Zada Wu for nonhealing ulcers on the lateral aspect of the right heel. Of note she has a history of type 1 diabetes mellitus that has been uncontrolled. Past medical history significant for type 1 diabetes mellitus not controlled, ankylosing spondylitis, anorexia nervosa, irritable bowel syndrome, chronic kidney disease, chronic diarrhea. she then developed gangrene of both feet due to severe peripheral vascular disease and also had gangrene of the tips of her fingers due to upper extremity vascular disease. She was being worked up by vascular surgery at [facility] and at [facility] and has had several procedures done there. She started with hyperbaric oxygen therapy and had a total of 40 treatments the last one being on 06/20/2015. After the initial treatment of  hyperbaric oxygen therapy she started having ear problems and had ultimately to use myringotomy tubes and this was done bilaterally. Since then her ears have been doing fine. In late September, she had seen vascular and hand surgeons. since then she's been in [place] at the rec center for surgery involving extensive vascular procedures for the upper extremities. She was then at [facility] with GI bleeds both upper and lower and has been in and out of hospital for that. She has recently been out of hospital for the last week. 09/09/2015 -- she was unable to get here in time to start her hyperbaric oxygen today and hence is only here for a wound care visit. 09/19/2015 -- she has been having vancomycin during her dialysis and continues to have vascular [name], [name] ([id number]) appointments and the procedure is been set for early January. She has been unable to make it for her hemodialysis due to various medical symptoms. 09/30/2014 -- her vancomycin was stopped on 09/25/2015 and the mother has noticed the right foot has started draining for the last 3 days. Addendum: after examining the patient I was able to talk to her primary vascular surgeon Dr. Pernell Dupre at the [facility]. I told him about the necrotic area on the plantar aspect of right foot which is now wet gangrene and he agreed with me that he would admit her at [facility] under her care and synchronize further treatment. We have discussed her poor prognosis and he and I discussed the need for hospice care and for sitting down and talking to the patient and her mother and giving them a proper detail of the prognosis. 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 She was worked up with MRIs, abdomen aortogram and placement of left-sided angioplasties were done. After a prolonged hospital she she was discharged home and was told to wear shrinker sock and stump protector and see her surgeon for further instructions regarding wound care and suture removal. Asked to take long-term doxycycline. 01/15/16; this is a patient I haven't seen before although she is been followed by Dr. Meyer Russel in this clinic today. She is a type I diabetic with severe PAD macrovascular disease. She has had a previous BKA. She has dry gangrene of the tips of her fingers which she showed me on the right to. She also has dry gangrene of the left first second and third toes and a portion of her proximal foot around these areas. She is followed by vascular surgery at [facility] and saw them recently they are not going to do surgery as of yet. She has a large black eschar over her heel which is beginning to separate in some areas. As I understand think she is paining these with Betadine. There is been some suggestion about retrying hyperbarics on her although she is still not able to commit to the frequency of treatment that would be necessary to see improvement. She is also on Monday Wednesday Friday dialysis 01/21/16; the patient returns to see me today with regards to the left heel. Apparently she is not scheduled for any further attempts at revascularization of the left foot. 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 01/28/16; the patient returns today for attention to the left foot specifically the left calcaneal ulcer. This is covered in a thick black eschar. I crosshatched this last week and we have been using Santyl. 02/05/16; I continue to work on the thick black eschar on the  patient's left calcaneus. She is using Santyl that this side crosshatched this area and the eschar is beginning to loosen. 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 This is beginning to separate and liquefy as well especially between the first and second toes. In the time being her major complaint is fatigue at dialysis 02/26/16 I continue to work on the patient's left calcaneus thick adherent black eschar. I crosshatched this area and we have been using Santyl although it is very adherent area I remove some nonviable tissue today what I can see of this actually looks surprisingly good. On the same foot she has dry gangrene on the first and second toes and part of the forefoot underneath this. This is beginning to separate. 03/11/16; the patient comes in for her every 2 week appointment. I have been working on the black eschar on her heel. The patient apparently again has to come off dialysis early today after 2 hours due to severe complaints of nausea. She really does not look well. 04/02/2016 -- the patient has not been seen here for about 3 weeks now and has a new issue with the stump of the right  amputation site and also her right posterior thigh. They have only noticed this for the last couple of days. 04/28/16; I had received a call from the patient's surgeon at [facility]. She had a left transmetatarsal amputation Ruben, Lesbia N. ([id number]) and Integra applied to the left heel. Both of these areas appear to be doing well. There are dressing these with [facility] and they will return to their surgeon on Thursday. She has a new injury on the popliteal fossa on the right which I think was trauma from her stump. 05/20/16; the patient is following with her surgeon at [facility]. She's had a left transmetatarsal debridement of the left heel she had Integra place and  apparently is using some consternation of Epson salts soaks, Betadine and Aquacel Ag. The left leg is wrapped I did not look at this today. She has the wound in her right popliteal fossa which is apparently a pressure area possibly related to sitting on a toilet for 2-3 hours multiple times a day. 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 We have been using Santyl to the area on the right popliteal fossa. She has a new wound on her right gluteal area but the patient would not let me look at that today 06/03/16; patient is not doing particularly well. She now has a pressure area over her left ischial tuberosity. This is of quite a size and covered with an adherent surface slough. She looks as though she has lost weight, I didn't want to go ahead and attempt to debridement this today. There is really no evidence of infection. The area behind the right popliteal fossa looks about the same as 2 weeks ago we have been using Santyl to this area. I did not look at her left foot which is wrapped been followed by podiatry at [facility] 06/10/16; patient arrives in clinic actually looking a lot brighter than I usually see her, predictably she did not go to dialysis today. For the first time in perhaps 6 weeks I actually saw her left foot today. The transmetatarsal site is healed. The left heel has a reasonably stable-looking wound which has a clean base. Some eschar superiorly and a few sutures remain in place. More worrisome is an area on her lateral left foot over the metatarsal head. Quarter size necrotic wound that probes to bone. There is some purulence here which I cultured there is nothing that looks like a healthy base of this area. They've been using silver alginate to this area at home. She also has a large wound in the right popliteal fossa, and again a pressure area over the left ischial tuberosity. We are using Santyl to both of  these areas. Both allays looks somewhat better than last week, using Santyl to both areas 06/17/16: culture from last week grew citrobacter and amp sensitive enterococcus fecalis. Started on vanc last Saturday at dialysis. have spoken to dialysis in [place] re adjustment in antibiotics added ceftazidine to vanc 06/24/16; the patient was discharged from [facility] yesterday. She had an IandD of the open area on the lateral aspect of her foot. She continues on vancomycin and Ceptaz again at dialysis although I'm not exactly sure of the current duration of this. She also had a debridement of the wound over the left greater trochanter done by the wound care team. She is receiving Santyl based dressings to this and the area in her right popliteal fossa. She arrives today completely  fatigued from dialysis. I spoke to Dr. Allena Katz her podiatrist and surgeon at [facility] and asked if we could manage the wound VAC which I think we can. He also wanted to ask about hyperbaric oxygen with the indication of chronic osteomyelitis although at this point I am not completely certain where the chronic osteomyelitis is. Finally she apparently has had a re-graft to the area on her left heel which is either a skin graft or Integra 07/15/16; patient arrives today she is not been in the clinic since I saw her on 9/27. She's been using Santyl to the left greater trochanter, I popliteal fossa. Her home health nurse remove the wound VAC from the transmetatarsal head, there is only a small wound on the medial aspect of the foot. Dr. Allena Katz who is been managing the heel wounds has requested Xeroform to the heel. 07/22/16; patient arrives as usual postdialysis she is very fatigued and weak looking. She generally tolerates dialysis poorly requiring midodrine tosupport her blood pressure. She continues on vancomycin and I believe ceftazadine at dialysis for chronic osteomyelitis She has a wound on the lateral left transmetatarsal  amputation. This was debrided of surface slough nonviable subcutaneous tissue the base of this looks healthy and improved. Her left heel wound is followed by by Dr. Allena Katz at [facility] her surgeon. We have been applying Xeroform to this at his request, he'll She has a deep wound over her left greater trochanter however this does not appear to be infected has healthy granulation and I think could be well served by a wound VAC with collagen under the foam Under the right popliteal fossa there is a pressure area apparently from a toilet seat. This has been making [name], [name] ([id number]) progress again the tissue here appears to be healthy we have been using silver collagen in this area as well There is a new small area on the lateral aspect of her right stump which appears to be draining purulent material. Our nurses obtained a specimen of this for culture. As noted she is already onVAncomycin e a third generation cephalosporin Objective Constitutional Patient is hypotensive.. Pulse regular and within target range for patient.Marland Kitchen Respirations regular, non-labored and within target range.. Temperature is normal and within the target range for the patient.. Patient's appearance is neat and clean. Appears in no acute distress. Well nourished and well developed.. Vitals Time Taken: 3:58 PM, Height: 68 in, Weight: 96 lbs, BMI: 14.6, Pulse: 73 bpm, Respiratory Rate: 16 breaths/min, Blood Pressure: 98/74 mmHg. General Notes: manual bp General Notes: Wound exam; please see HPI for description of the current wounds. Integumentary (Hair, Skin) Behind the right popliteal fossa is of fairly clear tinial infection. Wound #16 status is Open. Original cause of wound was Pressure Injury. 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 The wound measures 2.3cm length x 2.3cm width x 0.3cm depth; 4.155cm^2 area and 1.246cm^3 volume. There is fat exposed. There is no tunneling or  undermining noted. There is a large amount of serosanguineous drainage noted. The wound margin is distinct with the outline attached to the wound base. There is large (67-100%) red, pink granulation within the wound bed. There is a small (1-33%) amount of necrotic tissue within the wound bed including Eschar and Adherent Slough. The periwound skin appearance exhibited: Localized Edema, Erythema. The periwound skin appearance did not exhibit: Callus, Crepitus, Excoriation, Fluctuance, Friable, Induration, Rash, Scarring, Dry/Scaly, Maceration, Moist, Atrophie Blanche, Cyanosis, Ecchymosis, Hemosiderin Staining, Mottled, Pallor, Rubor. The surrounding  wound skin color is noted with erythema which is circumferential. Periwound temperature was noted as No Abnormality. The periwound has tenderness on palpation. Wound #17 status is Open. Original cause of wound was Surgical Injury. The wound is located on the Left Amputation Site - Transmetatarsal. The wound measures 1cm length x 1cm width x 0.5cm depth; 0.785cm^2 area and 0.393cm^3 volume. The wound is limited to skin breakdown. There is a medium amount of serous drainage noted. The wound margin is flat and intact. There is small (1-33%) red granulation within the wound bed. There is a large (67-100%) amount of necrotic tissue within the wound bed including Adherent Slough. The periwound skin appearance exhibited: Moist. The periwound skin appearance did not exhibit: Callus, Crepitus, Excoriation, Fluctuance, Friable, Induration, Localized Edema, Rash, Scarring, Dry/Scaly, Maceration, Atrophie Blanche, Cyanosis, Ecchymosis, Hemosiderin Staining, Mottled, Pallor, Rubor, Erythema. [name], Wu ([id number]) Wound #18 status is Open. Original cause of wound was Pressure Injury. The wound is located on the Left Ischial Tuberosity. The wound measures 2.9cm length x 2.8cm width x 1.2cm depth; 6.377cm^2 area and 7.653cm^3 volume. The wound is limited  to skin breakdown. There is a large amount of serous drainage noted. The wound margin is flat and intact. There is large (67-100%) pink granulation within the wound bed. There is a small (1-33%) amount of necrotic tissue within the wound bed including Eschar and Adherent Slough. The periwound skin appearance exhibited: Moist, Erythema. The periwound skin appearance did not exhibit: Callus, Crepitus, Excoriation, Fluctuance, Friable, Induration, Localized Edema, Rash, Scarring, Dry/Scaly, Maceration, Atrophie Blanche, Cyanosis, Ecchymosis, Hemosiderin Staining, Mottled, Pallor, Rubor. The surrounding wound skin color is noted with erythema which is circumferential. Wound #19 status is Open. Original cause of wound was Gradually Appeared. The wound is located on the Right,Lateral Amputation Site - Below Knee. The wound measures 0.2cm length x 0.2cm width x 0.1cm depth; 0.031cm^2 area and 0.003cm^3 volume. The wound is limited to skin breakdown. There is no tunneling or undermining noted. There is a large amount of purulent drainage noted. The wound margin is flat and intact. There is large (67-100%) red granulation within the wound bed. There is no necrotic tissue within the wound bed. The periwound skin appearance exhibited: Localized Edema, Erythema. The periwound skin appearance did not exhibit: Callus, Crepitus, Excoriation, Fluctuance, Friable, Induration, Rash, Scarring, Dry/Scaly, Maceration, Moist, Atrophie Blanche, Cyanosis, Ecchymosis, Hemosiderin Staining, Mottled, Pallor, Rubor. The surrounding wound skin color is noted with erythema which is circumferential. Periwound temperature was noted as No Abnormality. Wound #9 status is Open. Original cause of wound was Pressure Injury. The wound is located on the Left Calcaneous. The wound measures 4cm length x 2.7cm width x 0.1cm depth; 8.482cm^2 area and 0.848cm^3 volume. The wound is limited to skin breakdown. There is a none present amount  of drainage noted. The wound margin is flat and intact. There is medium (34-66%) red granulation within the wound bed. There is a medium (34-66%) amount of necrotic tissue within the wound bed including Eschar and Adherent Slough. The periwound skin appearance exhibited: Moist. The periwound skin appearance did not exhibit: Callus, Crepitus, Excoriation, Fluctuance, Friable, Induration, Localized Edema, Rash, Scarring, Dry/Scaly, Maceration, Atrophie Blanche, Cyanosis, Ecchymosis, Hemosiderin Staining, Mottled, Pallor, Rubor, Erythema. 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 ([id number]) Procedures Wound #17 Wound #17 is an Open Surgical Wound located on the Left Amputation Site - Transmetatarsal . There was a Skin/Subcutaneous Tissue Debridement ([id number]) debridement with total area of 1 sq cm performed by [name] Caul, MD. with the following instrument(s): Curette to remove Viable and Non-Viable tissue/material including Exudate, Fibrin/Slough, and Subcutaneous after achieving pain control using Lidocaine 4% Topical Solution. A time out was conducted at 16:37, prior to the start of the procedure. A Minimum amount of bleeding was controlled with Pressure. The procedure was tolerated well with a pain level of 0 throughout and a pain level of 0 following the procedure. Post Debridement  Measurements: 1cm length x 1cm width x 0.6cm depth; 0.471cm^3 volume. Character of Wound/Ulcer Post Debridement is improved. Severity of Tissue Post Debridement is: Fat layer exposed. Post procedure Diagnosis Wound #17: Same as Pre-Procedure Plan Wound Cleansing: 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 Cleanse wound with mild soap and water Wound #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: Clean wound with Normal Saline. Cleanse wound with mild soap and water Wound #18 Left Ischial Tuberosity: Clean wound with Normal Saline. Cleanse wound with mild soap and water Wound #19 Right,Lateral Amputation Site - Below Knee: Clean wound with Normal Saline. Cleanse wound with mild soap and water Wound #9 Left Calcaneous: Clean wound with Normal Saline. 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,Posterior Amputation Site - Below Knee: Topical Lidocaine 4% cream applied to wound bed prior to debridement Wound #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: Topical Lidocaine 4% cream applied to wound bed prior to debridement Wound #18 Left Ischial Tuberosity: Topical Lidocaine 4% cream applied to wound bed prior to debridement Wound #19 Right,Lateral Amputation Site - Below Knee: Topical Lidocaine 4% cream applied to wound bed prior to debridement [name], [name] ([id number]) Wound #9 Left Calcaneous: Topical Lidocaine 4% cream applied to wound bed prior to debridement Primary Wound Dressing: 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,Posterior Amputation Site - Below Knee: Prisma Ag - or collagen with silver equivalent Wound #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: Prisma Ag - or collagen with silver equivalent Wound #18 Left Ischial Tuberosity: Prisma Ag - or collagen with silver equivalent Wound #19 Right,Lateral Amputation Site - Below Knee: Other: - triple antibiotic ointment Wound #9 Left Calcaneous: Xeroform Secondary  Dressing: 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,Posterior Amputation Site - Below Knee: Boardered Foam Dressing Wound #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: Gauze, ABD and Kerlix/Conform - wrap lower leg with ace wrap Wound #18 Left Ischial Tuberosity: Boardered Foam Dressing Boardered Foam Dressing Wound #19 Right,Lateral Amputation Site - Below Knee: Boardered Foam Dressing Wound #9 Left Calcaneous: Gauze, ABD and Kerlix/Conform - wrap lower leg with ace wrap Dressing Change Frequency: 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 Wound #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: Change dressing every day. Wound #18 Left Ischial Tuberosity: Change dressing every day. Wound #19 Right,Lateral Amputation Site - Below Knee: Change dressing every day. Wound #9 Left Calcaneous: Change dressing every day. 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 Other: - as patient is able Wound #17  Left Amputation Site - Transmetatarsal: Return Appointment in 2 weeks. Other: - as patient is able Wound #18 Left Ischial Tuberosity: Return Appointment in 2 weeks. Other: - as patient is able Wound #19 Right,Lateral Amputation Site - Below Knee: Return Appointment in 2 weeks. Other: - as patient is able Wound #9 Left Calcaneous: Return Appointment in 2 weeks. TALIN, ROZEBOOM ([id number]) Other: - as patient is able Home Health: 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,Posterior Amputation Site - Below Knee: Continue Home Health Visits Home Health Nurse may visit PRN to address patient s wound care needs. FACE TO FACE ENCOUNTER: MEDICARE and MEDICAID PATIENTS: I certify that this patient is under my care and that I had a face-to-face encounter that meets the physician face-to-face encounter requirements with this patient on this date. The encounter with the patient was in whole or in part for the following MEDICAL CONDITION:  (primary reason for Home Healthcare) MEDICAL NECESSITY: I certify, that based on my findings, NURSING services are a medically necessary home health service. HOME BOUND STATUS: I certify that my clinical findings support that this patient is homebound (i.e., Due to illness or injury, pt requires aid of supportive devices such as crutches, cane, wheelchairs, walkers, the use of special transportation or the assistance of another person to leave their place of residence. There is a normal inability to leave the home and doing so requires considerable and taxing effort. Other absences are for medical reasons / religious services and are infrequent or of short duration when for other reasons). If current dressing causes regression in wound condition, may D/C ordered dressing product/s and apply Normal Saline Moist Dressing daily until next Wound Healing Center / Other MD appointment. Notify Wound Healing Center of regression in wound condition at [phone]. 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 FACE TO FACE ENCOUNTER: MEDICARE and MEDICAID PATIENTS: I certify that this patient is under my care and that I had a face-to-face encounter that meets the physician face-to-face encounter requirements with this patient on this date. The encounter with the patient was in whole or in part for the following MEDICAL CONDITION: (primary reason for Home Healthcare) MEDICAL NECESSITY: I certify, that based on my findings, NURSING services are a medically necessary home health service. HOME BOUND STATUS: I certify that my clinical findings support that this patient is homebound (i.e., Due to illness or injury, pt requires aid of supportive devices such as crutches, cane, wheelchairs, walkers, the use of  special transportation or the assistance of another person to leave their place of residence. There is a normal inability to leave the home and doing so requires considerable and taxing effort. Other absences are for medical reasons / religious services and are infrequent or of short duration when for other reasons). If current dressing causes regression in wound condition, may D/C ordered dressing product/s and apply Normal Saline Moist Dressing daily until next Wound Healing Center / Other MD appointment. Notify Wound Healing Center of regression in wound condition at [phone]. Please direct any NON-WOUND related issues/requests for orders to patient's Primary Care Physician Wound #18 Left Ischial Tuberosity: Continue Home Health Visits Home Health Nurse may visit PRN to address patient s wound care needs. FACE TO FACE ENCOUNTER: MEDICARE and MEDICAID PATIENTS: I certify that this patient is under my care and that I had a face-to-face  encounter that meets the physician face-to-face encounter requirements with this patient on this date. The encounter with the patient was in whole or in part for the following MEDICAL CONDITION: (primary reason for Home Healthcare) MEDICAL NECESSITY: I certify, that based on my findings, NURSING services are a medically necessary home health service. HOME BOUND STATUS: I certify that my clinical findings support that this patient is homebound (i.e., Due to illness or injury, pt requires aid of supportive devices such as crutches, cane, wheelchairs, walkers, the use of special transportation or the assistance of another person to leave their place of residence. There is a normal inability to leave the home and doing so requires considerable and taxing effort. Other absences are for medical reasons / religious services and are infrequent or of short duration when for other reasons). If current dressing causes regression in wound condition, may D/C ordered  dressing product/s and apply Normal Saline Moist Dressing daily until next Wound Healing Center / Other MD appointment. Notify Wound [name], [name] ([id number]) Healing Center of regression in wound condition at [phone]. Please direct any NON-WOUND related issues/requests for orders to patient's Primary Care Physician Wound #19 Right,Lateral Amputation Site - Below Knee: Continue Home Health Visits Home Health Nurse may visit PRN to address patient s wound care needs. FACE TO FACE ENCOUNTER: MEDICARE and MEDICAID PATIENTS: I certify that this patient is under my care and that I had a face-to-face encounter that meets the physician face-to-face encounter requirements with this patient on this date. The encounter with the patient was in whole or in part for the following MEDICAL CONDITION: (primary reason for Home Healthcare) MEDICAL NECESSITY: I certify, that based on my findings, NURSING services are a medically necessary home health service. HOME BOUND STATUS: I certify that my clinical findings support that this patient is homebound (i.e., Due to illness or injury, pt requires aid of supportive devices such as crutches, cane, wheelchairs, walkers, the use of special transportation or the assistance of another person to leave their place of residence. There is a normal inability to leave the home and doing so requires considerable and taxing effort. Other absences are for medical reasons / religious services and are infrequent or of short duration when for other reasons). If current dressing causes regression in wound condition, may D/C ordered dressing product/s and apply Normal Saline Moist Dressing daily until next Wound Healing Center / Other MD appointment. Notify Wound Healing Center of regression in wound condition at [phone]. Please direct any NON-WOUND related issues/requests for orders to patient's Primary Care Physician Wound #9 Left Calcaneous: Continue Home Health  Visits Home Health Nurse may visit PRN to address patient s wound care needs. FACE TO FACE ENCOUNTER: MEDICARE and MEDICAID PATIENTS: I certify that this patient is under my care and that I had a face-to-face encounter that meets the physician face-to-face encounter requirements with this patient on this date. The encounter with the patient was in whole or in part for the following MEDICAL CONDITION: (primary reason for Home Healthcare) MEDICAL NECESSITY: I certify, that based on my findings, NURSING services are a medically necessary home health service. HOME BOUND STATUS: I certify that my clinical findings support that this patient is homebound (i.e., Due to illness or injury, pt requires aid of supportive devices such as crutches, cane, wheelchairs, walkers, the use of special transportation or the assistance of another person to leave their place of residence. There is a normal inability to leave the home and doing so requires  considerable and taxing effort. Other absences are for medical reasons / religious services and are infrequent or of short duration when for other reasons). If current dressing causes regression in wound condition, may D/C ordered dressing product/s and apply Normal Saline Moist Dressing daily until next Wound Healing Center / Other MD appointment. Notify Wound Healing Center of regression in wound condition at [phone]. Please direct any NON-WOUND related issues/requests for orders to patient's Primary Care Physician Negative Pressure Wound Therapy: Wound #18 Left Ischial Tuberosity: Wound VAC settings at 125/130 mmHg continuous pressure. Use BLACK/GREEN foam to wound cavity. Use WHITE foam to fill any tunnel/s and/or undermining. Change VAC dressing 3 X WEEK. Change canister as indicated when full. Nurse may titrate settings and frequency of dressing changes as clinically indicated. Home Health Nurse may d/c VAC for s/s of increased infection, significant wound  regression, or uncontrolled drainage. Notify Wound Healing Center at [phone]. Laboratory ordered were: Wound culture routine - right lateral BKA site [name], [name] ([id number]) o #1 to the area on the left transmetatarsal amputation site, as well as the area over the left greater trochanter for the time being we are using silver collagen. Would like to consider a wound VAC to the left greater trochanter with collagen under the VAC foam will see if we can arrange this through home health. #2 the left heel we will continue with the Xeroform-based dressing requested by her surgeon #3 continue silver collagen to the area behind her right popliteal fossa #4. ketoconazole 2% to the rash in the right popliteal fossa #5 the area on the right stump I would simply use topical antibiotics and a cover until we see the culture result of this. She does not ambulate much with her prosthesis which is fortunate. The cause of this is not completely clear at the moment Electronic Signature(s) Signed: 08/04/2016 1:15:11 PM By: Elliot GurneyWoody, RN, [name], Kim RN, [name] Signed: 08/25/2016 7:50:59 AM By: [name] Najjarobson, Roshon Duell MD Previous Signature: 07/22/2016 6:13:30 PM Version By: [name] Najjarobson, Marketia Stallsmith MD Entered By: Elliot GurneyWoody, RN, [name], Kim on 08/04/2016 13:15:11 [name] EvesBLACKBURN, Malayshia N. ([id number]) -------------------------------------------------------------------------------- SuperBill Details [name] PayorBLACKBURN, [name] Date of Service: 07/22/2016 Patient Name: N. Patient Account Number: [id number] Medical Record Treating RN: [name] SitesDorthy, Joanna [id number] Number: Other Clinician: 01/02/1981 (35 y.o. 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, right leg L89.622 Pressure ulcer of left heel, stage 2 Z89.511 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, right lower extremity S71.101A Unspecified open wound, right thigh, initial encounter L89.322 Pressure ulcer of left buttock, stage 2 Facility Procedures CPT4 Code: [id number] Description: 11042 - DEB SUBQ TISSUE 20 SQ CM/< ICD-10 Description Diagnosis E10.621 Type 1 diabetes mellitus with foot ulcer Modifier: Quantity: 1 Physician Procedures CPT4 Code: [id number] Description: 11042 - WC PHYS SUBQ TISS 20 SQ CM ICD-10 Description Diagnosis E10.621 Type 1 diabetes mellitus with foot ulcer Modifier: Quantity: 1 Electronic Signature(s) Signed: 07/22/2016 6:13:30 PM By: [name] Najjarobson, Makinzie Considine MD Entered By: [name] Najjarobson, Price [place] on 07/22/2016 18:03:45

## 2016-07-25 LAB — AEROBIC CULTURE W GRAM STAIN (SUPERFICIAL SPECIMEN)

## 2016-07-25 LAB — AEROBIC CULTURE  (SUPERFICIAL SPECIMEN)

## 2016-07-30 ENCOUNTER — Ambulatory Visit: Payer: Medicare Other | Admitting: Surgery

## 2016-08-04 ENCOUNTER — Ambulatory Visit: Payer: Medicare Other | Admitting: Physical Therapy

## 2016-08-05 ENCOUNTER — Encounter: Payer: Medicare Other | Attending: Nurse Practitioner | Admitting: Nurse Practitioner

## 2016-08-05 DIAGNOSIS — E1022 Type 1 diabetes mellitus with diabetic chronic kidney disease: Secondary | ICD-10-CM | POA: Diagnosis not present

## 2016-08-05 DIAGNOSIS — I70261 Atherosclerosis of native arteries of extremities with gangrene, right leg: Secondary | ICD-10-CM | POA: Insufficient documentation

## 2016-08-05 DIAGNOSIS — E1052 Type 1 diabetes mellitus with diabetic peripheral angiopathy with gangrene: Secondary | ICD-10-CM | POA: Insufficient documentation

## 2016-08-05 DIAGNOSIS — E43 Unspecified severe protein-calorie malnutrition: Secondary | ICD-10-CM | POA: Diagnosis not present

## 2016-08-05 DIAGNOSIS — E10621 Type 1 diabetes mellitus with foot ulcer: Secondary | ICD-10-CM | POA: Diagnosis present

## 2016-08-05 DIAGNOSIS — L89322 Pressure ulcer of left buttock, stage 2: Secondary | ICD-10-CM | POA: Insufficient documentation

## 2016-08-05 DIAGNOSIS — T87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, right lower extremity: Secondary | ICD-10-CM | POA: Insufficient documentation

## 2016-08-05 DIAGNOSIS — N186 End stage renal disease: Secondary | ICD-10-CM | POA: Insufficient documentation

## 2016-08-05 DIAGNOSIS — I70245 Atherosclerosis of native arteries of left leg with ulceration of other part of foot: Secondary | ICD-10-CM | POA: Insufficient documentation

## 2016-08-05 DIAGNOSIS — Z992 Dependence on renal dialysis: Secondary | ICD-10-CM | POA: Diagnosis not present

## 2016-08-05 DIAGNOSIS — Z89511 Acquired absence of right leg below knee: Secondary | ICD-10-CM | POA: Diagnosis not present

## 2016-08-05 DIAGNOSIS — S71101A Unspecified open wound, right thigh, initial encounter: Secondary | ICD-10-CM | POA: Diagnosis not present

## 2016-08-05 DIAGNOSIS — X58XXXA Exposure to other specified factors, initial encounter: Secondary | ICD-10-CM | POA: Diagnosis not present

## 2016-08-05 DIAGNOSIS — L89622 Pressure ulcer of left heel, stage 2: Secondary | ICD-10-CM | POA: Diagnosis not present

## 2016-08-06 NOTE — Progress Notes (Addendum)
[name], HARMON ([id number]) Visit Report for 08/05/2016 Chief Complaint Document Details DOXIE, AUGENSTEIN 08/05/2016 3:45 Patient Name: Date of Service: N. PM Medical Record Patient Account Number: [id number] Number: Treating RN: [name] Date of Birth/Sex: 11-06-1980 (35 y.o. Female) Other Clinician: Primary Care Physician: Evie Lacks Referring Physician: [name] Physician/Extender: Tania Ade in Treatment: 5 Information Obtained from: Patient Chief Complaint Ms. Krajewski returns for evaluation of multiple ulcerations Electronic Signature(s) Signed: 08/06/2016 1:40:33 AM By: Bonnell Public Entered By: Bonnell Public on 08/06/2016 01:40:33 [name] ([id number]) -------------------------------------------------------------------------------- HPI Details AAMIYAH, DERRICK 08/05/2016 3:45 Patient Name: Date of Service: N. PM Medical Record Patient Account Number: [id number] Number: Treating RN: [name] Date of Birth/Sex: 05-08-1981 (35 y.o. Female) Other Clinician: Primary Care Physician: [facility], Karlea Mckibbin Referring Physician: [name] Physician/Extender: Tania Ade in Treatment: 48 History of Present Illness Location: dry gangrene both feet and heels Quality: Patient reports No Pain. Severity: Patient states wound are getting worse. Duration: Patient has had the wound for > 4months prior to seeking treatment at the wound center Context: The wound appeared gradually over time Modifying Factors: she has been in and out of hospital over the last 2 months Associated Signs and Symptoms: Patient reports having difficulty standing for long periods. HPI Description: Lizza Huffaker is a 35 y.o. female who presents to our wound center, back in June 2016, referred by her PCP Dr. Zada Finders for nonhealing ulcers on the lateral aspect of the right heel. Of note she has a history of type 1  diabetes mellitus that has been uncontrolled. Past medical history significant for type 1 diabetes mellitus not controlled, ankylosing spondylitis, anorexia nervosa, irritable bowel syndrome, chronic kidney disease, chronic diarrhea. she then developed gangrene of both feet due to severe peripheral vascular disease and also had gangrene of the tips of her fingers due to upper extremity vascular disease. She was being worked up by vascular surgery at [facility] and at [facility] and has had several procedures done there. She started with hyperbaric oxygen therapy and had a total of 40 treatments the last one being on 06/20/2015. After the initial treatment of hyperbaric oxygen therapy she started having ear problems and had ultimately to use myringotomy tubes and this was done bilaterally. Since then her ears have been doing fine. In late September, she had seen vascular and hand surgeons. since then she's been in [place] at the rec center for surgery involving extensive vascular procedures for the upper extremities. She was then at [facility] with GI bleeds both upper and lower and has been in and out of hospital for that. She has recently been out of hospital for the last week. 09/09/2015 -- she was unable to get here in time to start her hyperbaric oxygen today and hence is only here for a wound care visit. 09/19/2015 -- she has been having vancomycin during her dialysis and continues to have vascular appointments and the procedure is been set for early January. She has been unable to make it for her hemodialysis due to various medical symptoms. 09/30/2014 -- her vancomycin was stopped on 09/25/2015 and the mother has noticed the right foot has started draining for the last 3 days. Addendum: after examining the patient I was able to talk to her primary vascular surgeon Dr. Pernell Dupre at the [facility]. I told him about the necrotic area on the plantar aspect of right foot which is now wet gangrene and  he agreed with me that he would  admit her at [facility] under her care and synchronize further treatment. We have discussed her poor prognosis and he and I discussed the need for hospice care and for sitting down and talking to the patient and her mother and giving them a proper detail of the prognosis. 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 She was worked up with MRIs, abdomen aortogram and placement of left-sided angioplasties were done. After a prolonged hospital she she was discharged home and was told to wear shrinker sock and stump protector and see her surgeon for further instructions regarding wound care and suture removal. Asked to take long-term doxycycline. 01/15/16; this is a patient I haven't seen before although she is been followed by Dr. Meyer Russel in this clinic today. She is a type I diabetic with severe PAD macrovascular disease. She has had a previous BKA. She has dry gangrene of the tips of her fingers which she showed me on the right to. She also has dry gangrene of the left first second and third toes and a portion of her proximal foot around these areas. She is followed by vascular surgery at [facility] and saw them recently they are not going to do surgery as of yet. She has a large black eschar over her heel which is beginning to separate in some areas. As I understand think she is paining these with Betadine. There is been some suggestion about retrying hyperbarics on her although she is still not able to commit to the frequency of treatment that  would be necessary to see improvement. She is also on Monday Wednesday Friday dialysis 01/21/16; the patient returns to see me today with regards to the left heel. Apparently she is not scheduled for any further attempts at revascularization of the left foot. 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 01/28/16; the patient returns today for attention to the left foot specifically the left calcaneal ulcer. This is covered in a thick black eschar. I crosshatched this last week and we have been using Santyl. 02/05/16; I continue to work on the thick black eschar on the patient's left calcaneus. She is using Santyl that this side crosshatched this area and the eschar is beginning to loosen. 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 This is beginning to separate and liquefy as well especially between the first and second toes. In the time being her major complaint is fatigue at dialysis 02/26/16 I continue to work on the patient's left calcaneus thick adherent black eschar. I crosshatched this area and we have been using Santyl although it is very adherent area I remove some nonviable tissue today what I can see of this actually looks surprisingly good. On the same foot she has dry gangrene on the first and second toes and part of the forefoot underneath this. This is beginning to separate. 03/11/16; the patient comes in for her every 2 week appointment. I have been  working on the black eschar on her heel. The patient apparently again has to come off dialysis early today after 2 hours due to severe complaints of nausea. She really does not look well. 04/02/2016 -- the patient has not been seen here for about 3 weeks now and has a new issue with the stump of the right amputation site and also her  right posterior thigh. They have only noticed this for the last couple of days. 04/28/16; I had received a call from the patient's surgeon at [facility]. She had a left transmetatarsal amputation and Integra applied to the left heel. Both of these areas appear to be doing well. There are dressing these with [facility] and they will return to their surgeon on Thursday. She has a new injury on the popliteal fossa on the right which I think was trauma from her stump. 05/20/16; the patient is following with her surgeon at [facility]. She's had a left transmetatarsal debridement of the left heel she had Integra place and apparently is using some consternation of Epson salts soaks, Betadine and Aquacel Ag. The left leg is wrapped I did not look at this today. She has the wound in her right popliteal fossa which is apparently a pressure area possibly related to sitting on a toilet for 2-3 hours multiple times a day. 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 We have been using Santyl to the area on the right popliteal fossa. She has a new wound on her right gluteal area but the patient would not let me look at that today 06/03/16; patient is not doing particularly well. She now has a pressure area over her left ischial tuberosity. Braaksma, Amantha N. ([id number]) This is of quite a size and covered with an adherent surface slough. She looks as though she has lost weight, I didn't want to go ahead and attempt to debridement this today. There is really no evidence of infection. The area behind the right popliteal fossa looks about the same as 2 weeks ago we have been using Santyl to this area. I did not look at her left foot which is wrapped been followed by podiatry at [facility] 06/10/16; patient arrives in clinic actually looking a lot brighter than I usually see her, predictably she did not go to dialysis today. For the first time in perhaps 6 weeks  I actually saw her left foot today. The transmetatarsal site is healed. The left heel has a reasonably stable-looking wound which has a clean base. Some eschar superiorly and a few sutures remain in place. More worrisome is an area on her lateral left foot over the metatarsal head. Quarter size necrotic wound that probes to bone. There is some purulence here which I cultured there is nothing that looks like a healthy base of this area. They've been using silver alginate to this area at home. She also has a large wound in the right popliteal fossa, and again a pressure area over the left ischial tuberosity. We are using Santyl to both of these areas. Both allays looks somewhat better than last week, using Santyl to both areas 06/17/16: culture from last week grew citrobacter and amp sensitive enterococcus fecalis. Started on vanc last Saturday at dialysis. have spoken to dialysis in [place] re adjustment in antibiotics added ceftazidine to vanc 06/24/16; the patient was discharged from [facility] yesterday. She had an IandD of the open area on the lateral aspect of her foot.  She continues on vancomycin and Ceptaz again at dialysis although I'm not exactly sure of the current duration of this. She also had a debridement of the wound over the left greater trochanter done by the wound care team. She is receiving Santyl based dressings to this and the area in her right popliteal fossa. She arrives today completely fatigued from dialysis. I spoke to Dr. Allena Katz her podiatrist and surgeon at [facility] and asked if we could manage the wound VAC which I think we can. He also wanted to ask about hyperbaric oxygen with the indication of chronic osteomyelitis although at this point I am not completely certain where the chronic osteomyelitis is. Finally she apparently has had a re-graft to the area on her left heel which is either a skin graft or Integra 07/15/16; patient arrives today she is not been in the clinic  since I saw her on 9/27. She's been using Santyl to the left greater trochanter, I popliteal fossa. Her home health nurse remove the wound VAC from the transmetatarsal head, there is only a small wound on the medial aspect of the foot. Dr. Allena Katz who is been managing the heel wounds has requested Xeroform to the heel. 07/22/16; patient arrives as usual postdialysis she is very fatigued and weak looking. She generally tolerates dialysis poorly requiring midodrine tosupport her blood pressure. She continues on vancomycin and I believe ceftazadine at dialysis for chronic osteomyelitis oShe has a wound on the lateral left transmetatarsal amputation. This was debrided of surface slough nonviable subcutaneous tissue the base of this looks healthy and improved. oHer left heel wound is followed by by Dr. Allena Katz at [facility] her surgeon. We have been applying Xeroform to this at his request, he'll oShe has a deep wound over her left greater trochanter however this does not appear to be infected has healthy granulation and I think could be well served by a wound VAC with collagen under the foam oUnder the right popliteal fossa there is a pressure area apparently from a toilet seat. This has been making progress again the tissue here appears to be healthy we have been using silver collagen in this area as well oThere is a new small area on the lateral aspect of her right stump which appears to be draining purulent material. Our nurses obtained a specimen of this for culture. As noted she is already onVAncomycin e a third generation cephalosporin 08/05/16 -Ms. Meditz arrives today accompanied by her aunt and cousin, she did not receive dialysis today due to "feeling bad" and is scheduled to received dialysis tomorrow. She has completed vancomycin that she was receiving with dialysis. She has an appointment with podiatry on Friday; admits to using right prosthesis for transfers primarily -She has a wound on the  lateral left TMA site, essentially unchanged and stable; staples remain in medial aspect of healed surgical site from August, patient states that the plan is to remove staples on Friday Jefferson, Riven N. ([id number]) -Left heel ulcer appears stable with granulation tissue -stage III left trochanter pressure ulcer with peri-wound irritation, appears to be from leakage despite patient stating that VAC has not leaked, has been using Prisma and wound VAC -Right popliteal fossa pressure ulcer is stable -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 and pale erythema to lateral aspect, improved from last week, although purulence able to be expressed, will encourage warm compresses to aide in bring fluid to surface - new areas of DTI to left foot dorsal and lateral aspect; patient  admits that foot has been wrapped with ace per podiatry, she also wears surgical shoe to left foot; dorsal DTI has intact blister; dorsal DTI not present at last appointment, lateral DTI present but significantly smaller at last appointment; both areas (dorsal>lateral) concerning for threatening limb loss, new to size and location - she has seen "limb loss specialist" at [facility] in the past and was encouraged to follow up with that provider in presence of new development Electronic Signature(s) Signed: 08/10/2016 3:11:05 PM By: Penne Lash, NP, Tenika Keeran Previous Signature: 08/06/2016 1:54:25 AM Version By: Bonnell Public Entered By: Penne Lash, NP, Jasira Robinson on 08/10/2016 15:11:04 [name] ([id number]) -------------------------------------------------------------------------------- Physical Exam Details KARIANN, WECKER 08/05/2016 3:45 Patient Name: Date of Service: N. PM Medical Record Patient Account Number: [id number] Number: Treating RN: [name] Date of Birth/Sex: 06-24-1981 (35 y.o. Female) Other Clinician: Primary Care Physician: [facility] Kras, Taelyn Nemes Referring Physician: [name] Physician/Extender: Tania Ade in Treatment: 48 Constitutional BP within normal limits. fatigued, underweight, frail. Respiratory non-labored respiratory effort. Musculoskeletal decreased muscle tone, deconditioned,unable to walk any distance limited to transfers with assistance of prosthesis and surgical shoe,. Psychiatric oriented to time, place, person and situation. depressive affect, tearful. Notes -patient is tearful and emotional expressing frustration with "always getting bad news" -frank discussion had with removing any pressure from left foot, wearing surgical shoe only to transfer safely and to avoid ace wrap Electronic Signature(s) Signed: 08/06/2016 1:59:52 AM By: Bonnell Public Entered By: Bonnell Public on 08/06/2016 01:59:51 [name] ([id number]) -------------------------------------------------------------------------------- Physician Orders Details YASHVI, JASINSKI 08/05/2016 3:45 Patient Name: Date of Service: N. PM Medical Record Patient Account Number: [id number] Number: Treating RN: Phillis Haggis Date of Birth/Sex: 06/09/1981 (35 y.o. Female) Other Clinician: Primary Care Physician: [facility] Kras, Omarrion Carmer Referring Physician: [name] Physician/Extender: Tania Ade in Treatment: 19 Verbal / Phone Orders: Yes Clinician: Ashok Cordia, Debi Read Back and Verified: Yes Diagnosis Coding Wound Cleansing 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,Posterior Amputation Site - Below Knee o Clean wound with Normal Saline. o Cleanse wound with mild soap and water Wound #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 o Clean wound with Normal Saline. o Cleanse wound with mild soap and water Wound #18 Left Ischial Tuberosity o Clean wound with Normal Saline. o Cleanse wound with mild soap and water Wound #19 Right,Lateral Amputation Site - Below Knee o Clean wound with Normal Saline. o Cleanse wound with mild soap and water Wound #9 Left  Calcaneous o Clean wound with Normal Saline. 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,Posterior Amputation Site - Below Knee o Topical Lidocaine 4% cream applied to wound bed prior to debridement Wound #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 o Topical Lidocaine 4% cream applied to wound bed prior to debridement Wound #18 Left Ischial Tuberosity o Topical Lidocaine 4% cream applied to wound bed prior to debridement Wound #19 Right,Lateral Amputation Site - Below Knee o Topical Lidocaine 4% cream applied to wound bed prior to debridement Wound #9 Left Calcaneous o Topical Lidocaine 4% cream applied to wound bed prior to debridement Horrell, Liyah N. ([id number]) Primary Wound Dressing 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,Posterior Amputation Site - Below Knee o Prisma Ag - or collagen with silver equivalent Wound #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 o Iodosorb Ointment Wound #18 Left Ischial Tuberosity o Saline moistened gauze - in clinic and HHRN to place wound vac o Prisma Ag - or collagen with silver equivalent place under wound vac, place under undermining as well in clinic place [facility]  Wound #19 Right,Lateral Amputation Site - Below Knee o Other: - triple antibiotic ointment warm compresses 3x a day 10 minutes at a time Wound #9 Left Calcaneous o Iodosorb Ointment Secondary Dressing 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,Posterior Amputation Site - Below Knee o Boardered Foam Dressing Wound #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 o Gauze, ABD and Kerlix/Conform - wrap lower leg with ace wrap Wound #18 Left Ischial Tuberosity o Boardered Foam Dressing o Foam - around the wound and under wound vac Wound #19 Right,Lateral Amputation Site - Below Knee o Boardered Foam Dressing Wound #9 Left Calcaneous o Dry Gauze o Conform/Kerlix o Foam - heel cup Dressing Change Frequency 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,Posterior Amputation Site - Below Knee o  Change dressing every day. Wound #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 o Change dressing every day. DEKLYNN, CHARLET ([id number]) Wound #18 Left Ischial Tuberosity o Three times weekly - Monday, Wednesday, Friday Wound #19 Right,Lateral Amputation Site - Below Knee o Change dressing every day. Wound #9 Left Calcaneous o Change dressing every day. Follow-up Appointments 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,Posterior Amputation Site - Below Knee o Return Appointment in 2 weeks. o Other: - as patient is able Wound #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 o Return Appointment in 2 weeks. o Other: - as patient is able Wound #18 Left Ischial Tuberosity o Return Appointment in 2 weeks. o Other: - as patient is able Wound #19 Right,Lateral Amputation Site - Below Knee o Return Appointment in 2 weeks. o Other: - as patient is able Wound #9 Left Calcaneous o Return Appointment in 2 weeks. o Other: - as patient is able Additional Orders / Instructions o Increase protein intake. Home Health 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,Posterior Amputation Site - Below Knee o Continue Home Health Visits o Home Health Nurse may visit PRN to address patientos wound care needs. o FACE TO FACE ENCOUNTER: MEDICARE and MEDICAID PATIENTS: I certify that this patient is under my care and that I had a face-to-face encounter that meets the physician face-to-face encounter requirements with this patient on this date. The encounter with the patient was in whole or in part for the following MEDICAL CONDITION: (primary reason for Home Healthcare) MEDICAL NECESSITY: I certify, that based on my findings, NURSING services are a medically necessary home health service. HOME BOUND STATUS: I certify that my clinical findings support that this patient is homebound (i.e., Due to illness or injury, pt requires aid of supportive devices such as crutches, cane, wheelchairs, walkers, the use of special transportation  or the assistance of another person to leave their place of residence. There is a normal inability to leave the home and doing so requires considerable and taxing effort. MCKENZEE, BEEM ([id number]) absences are for medical reasons / religious services and are infrequent or of short duration when for other reasons). o If current dressing causes regression in wound condition, may D/C ordered dressing product/s and apply Normal Saline Moist Dressing daily until next Wound Healing Center / Other MD appointment. Notify Wound Healing Center of regression in wound condition at [phone]. o Please direct any NON-WOUND related issues/requests for orders to patient's Primary Care Physician Wound #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 o Continue Home Health Visits o Home Health Nurse may visit PRN to address patientos wound care needs. o FACE TO FACE ENCOUNTER: MEDICARE and MEDICAID PATIENTS: I certify that this patient is under my care and that I had a face-to-face encounter that meets the physician face-to-face encounter requirements with this patient on this date. The  encounter with the patient was in whole or in part for the following MEDICAL CONDITION: (primary reason for Home Healthcare) MEDICAL NECESSITY: I certify, that based on my findings, NURSING services are a medically necessary home health service. HOME BOUND STATUS: I certify that my clinical findings support that this patient is homebound (i.e., Due to illness or injury, pt requires aid of supportive devices such as crutches, cane, wheelchairs, walkers, the use of special transportation or the assistance of another person to leave their place of residence. There is a normal inability to leave the home and doing so requires considerable and taxing effort. Other absences are for medical reasons / religious services and are infrequent or of short duration when for other reasons). o If current dressing causes  regression in wound condition, may D/C ordered dressing product/s and apply Normal Saline Moist Dressing daily until next Wound Healing Center / Other MD appointment. Notify Wound Healing Center of regression in wound condition at [phone]. o Please direct any NON-WOUND related issues/requests for orders to patient's Primary Care Physician Wound #18 Left Ischial Tuberosity o Continue Home Health Visits o Home Health Nurse may visit PRN to address patientos wound care needs. o FACE TO FACE ENCOUNTER: MEDICARE and MEDICAID PATIENTS: I certify that this patient is under my care and that I had a face-to-face encounter that meets the physician face-to-face encounter requirements with this patient on this date. The encounter with the patient was in whole or in part for the following MEDICAL CONDITION: (primary reason for Home Healthcare) MEDICAL NECESSITY: I certify, that based on my findings, NURSING services are a medically necessary home health service. HOME BOUND STATUS: I certify that my clinical findings support that this patient is homebound (i.e., Due to illness or injury, pt requires aid of supportive devices such as crutches, cane, wheelchairs, walkers, the use of special transportation or the assistance of another person to leave their place of residence. There is a normal inability to leave the home and doing so requires considerable and taxing effort. Other absences are for medical reasons / religious services and are infrequent or of short duration when for other reasons). o If current dressing causes regression in wound condition, may D/C ordered dressing product/s and apply Normal Saline Moist Dressing daily until next Wound Healing Center / Other MD appointment. Notify Wound Healing Center of regression in wound condition at [phone]. o Please direct any NON-WOUND related issues/requests for orders to patient's Primary Care Physician ANASTAISA, WOODING  ([id number]) Wound #19 Right,Lateral Amputation Site - Below Knee o Continue Home Health Visits o Home Health Nurse may visit PRN to address patientos wound care needs. o FACE TO FACE ENCOUNTER: MEDICARE and MEDICAID PATIENTS: I certify that this patient is under my care and that I had a face-to-face encounter that meets the physician face-to-face encounter requirements with this patient on this date. The encounter with the patient was in whole or in part for the following MEDICAL CONDITION: (primary reason for Home Healthcare) MEDICAL NECESSITY: I certify, that based on my findings, NURSING services are a medically necessary home health service. HOME BOUND STATUS: I certify that my clinical findings support that this patient is homebound (i.e., Due to illness or injury, pt requires aid of supportive devices such as crutches, cane, wheelchairs, walkers, the use of special transportation or the assistance of another person to leave their place of residence. There is a normal inability to leave the home and doing so requires considerable and taxing effort. Other absences  are for medical reasons / religious services and are infrequent or of short duration when for other reasons). o If current dressing causes regression in wound condition, may D/C ordered dressing product/s and apply Normal Saline Moist Dressing daily until next Wound Healing Center / Other MD appointment. Notify Wound Healing Center of regression in wound condition at [phone]. o Please direct any NON-WOUND related issues/requests for orders to patient's Primary Care Physician Wound #9 Left Calcaneous o Continue Home Health Visits o Home Health Nurse may visit PRN to address patientos wound care needs. o FACE TO FACE ENCOUNTER: MEDICARE and MEDICAID PATIENTS: I certify that this patient is under my care and that I had a face-to-face encounter that meets the physician face-to-face encounter requirements with  this patient on this date. The encounter with the patient was in whole or in part for the following MEDICAL CONDITION: (primary reason for Home Healthcare) MEDICAL NECESSITY: I certify, that based on my findings, NURSING services are a medically necessary home health service. HOME BOUND STATUS: I certify that my clinical findings support that this patient is homebound (i.e., Due to illness or injury, pt requires aid of supportive devices such as crutches, cane, wheelchairs, walkers, the use of special transportation or the assistance of another person to leave their place of residence. There is a normal inability to leave the home and doing so requires considerable and taxing effort. Other absences are for medical reasons / religious services and are infrequent or of short duration when for other reasons). o If current dressing causes regression in wound condition, may D/C ordered dressing product/s and apply Normal Saline Moist Dressing daily until next Wound Healing Center / Other MD appointment. Notify Wound Healing Center of regression in wound condition at [phone]. o Please direct any NON-WOUND related issues/requests for orders to patient's Primary Care Physician Negative Pressure Wound Therapy Wound #18 Left Ischial Tuberosity o Wound VAC settings at 125/130 mmHg continuous pressure. Use BLACK/GREEN foam to wound cavity. Use WHITE foam to fill any tunnel/s and/or undermining. Change VAC dressing 3 X WEEK. Change canister as indicated when full. Nurse may titrate settings and frequency of dressing changes as clinically indicated. - place some white foam under the undermining as well Aguiniga, Takesha N. ([id number]) as the Prisma place foam around the peri wound o Home Health Nurse may d/c VAC for s/s of increased infection, significant wound regression, or uncontrolled drainage. Notify Wound Healing Center at [phone]. Electronic Signature(s) Signed: 08/05/2016  5:51:11 PM By: [name] Signed: 08/06/2016 2:25:42 AM By: Bonnell Public Entered By: [name] on 08/05/2016 16:47:36 [name] ([id number]) -------------------------------------------------------------------------------- Problem List Details MYRIAM, BRANDHORST 08/05/2016 3:45 Patient Name: Date of Service: N. PM Medical Record Patient Account Number: [id number] Number: Treating RN: [name] Date of Birth/Sex: December 28, 1980 (35 y.o. Female) Other Clinician: Primary Care Physician: [facility] Kras, Atira Borello Referring Physician: [name] Physician/Extender: Tania Ade in Treatment: 54 Active Problems ICD-10 Encounter Code Description Active Date Diagnosis E10.621 Type 1 diabetes mellitus with foot ulcer 08/30/2015 Yes E10.52 Type 1 diabetes mellitus with diabetic peripheral 08/30/2015 Yes angiopathy with gangrene I70.245 Atherosclerosis of native arteries of left leg with ulceration 08/30/2015 Yes of other part of foot I70.261 Atherosclerosis of native arteries of extremities with 08/30/2015 Yes gangrene, right leg L89.622 Pressure ulcer of left heel, stage 2 08/30/2015 Yes Z89.511 Acquired absence of right leg below knee 11/29/2015 Yes T87.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, right lower extremity 04/02/2016 Yes S71.101A Unspecified open wound, right thigh, initial encounter  04/02/2016 Yes L89.322 Pressure ulcer of left buttock, stage 2 06/10/2016 Yes L89.899 Pressure ulcer of other site, unspecified stage 08/06/2016 Yes Weichel, Billie N. ([id number]) [phone] Pressure ulcer of left hip, stage 3 08/05/2016 Yes Inactive Problems Resolved Problems ICD-10 Code Description Active Date Resolved Date L02.611 Cutaneous abscess of right foot 10/01/2015 10/01/2015 Electronic Signature(s) Signed: 08/06/2016 2:07:40 AM By: Bonnell Public Previous Signature: 08/06/2016 1:39:10 AM Version By: Bonnell Public Entered By: Bonnell Public on 08/06/2016  02:07:40 [name] ([id number]) -------------------------------------------------------------------------------- Progress Note Details [name], [name] 08/05/2016 3:45 Patient Name: Date of Service: N. PM Medical Record Patient Account Number: [id number] Number: Treating RN: [name] Date of Birth/Sex: 05-30-1981 (35 y.o. Female) Other Clinician: Primary Care Physician: Evie Lacks Referring Physician: [name] Physician/Extender: Tania Ade in Treatment: 78 Subjective Chief Complaint Information obtained from Patient Ms. Loncar returns for evaluation of multiple ulcerations History of Present Illness (HPI) The following HPI elements were documented for the patient's wound: Location: dry gangrene both feet and heels Quality: Patient reports No Pain. Severity: Patient states wound are getting worse. Duration: Patient has had the wound for > 61months prior to seeking treatment at the wound center Context: The wound appeared gradually over time Modifying Factors: she has been in and out of hospital over the last 2 months Associated Signs and Symptoms: Patient reports having difficulty standing for long periods. [name] is a 35 y.o. female who presents to our wound center, back in June 2016, referred by her PCP Dr. Zada Finders for nonhealing ulcers on the lateral aspect of the right heel. Of note she has a history of type 1 diabetes mellitus that has been uncontrolled. Past medical history significant for type 1 diabetes mellitus not controlled, ankylosing spondylitis, anorexia nervosa, irritable bowel syndrome, chronic kidney disease, chronic diarrhea. she then developed gangrene of both feet due to severe peripheral vascular disease and also had gangrene of the tips of her fingers due to upper extremity vascular disease. She was being worked up by vascular surgery at [facility] and at [facility] and has had several procedures done  there. She started with hyperbaric oxygen therapy and had a total of 40 treatments the last one being on 06/20/2015. After the initial treatment of hyperbaric oxygen therapy she started having ear problems and had ultimately to use myringotomy tubes and this was done bilaterally. Since then her ears have been doing fine. In late September, she had seen vascular and hand surgeons. since then she's been in [place] at the rec center for surgery involving extensive vascular procedures for the upper extremities. She was then at [facility] [address] with GI bleeds both upper and lower and has been in and out of hospital for that. She has recently been out of hospital for the last week. 09/09/2015 -- she was unable to get here in time to start her hyperbaric oxygen today and hence is only here for a wound care visit. 09/19/2015 -- she has been having vancomycin during her dialysis and continues to have vascular appointments and the procedure is been set for early January. She has been unable to make it for her hemodialysis due to various medical symptoms. KRYSTIAN, FERRENTINO ([id number]) 09/30/2014 -- her vancomycin was stopped on 09/25/2015 and the mother has noticed the right foot has started draining for the last 3 days. Addendum: after examining the patient I was able to talk to her primary vascular surgeon Dr. Pernell Dupre at the [facility]. I told him about the necrotic area on the  plantar aspect of right foot which is now wet gangrene and he agreed with me that he would admit her at [facility] under her care and synchronize further treatment. We have discussed her poor prognosis and he and I discussed the need for hospice care and for sitting down and talking to the patient and her mother and giving them a proper detail of the prognosis. 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 She was worked up with MRIs, abdomen aortogram and placement of left-sided angioplasties were done. After a prolonged hospital she she was discharged home and was told to wear shrinker sock and stump protector and see her surgeon for further instructions regarding wound care and suture removal. Asked to take long-term doxycycline. 01/15/16; this is a patient I haven't seen before although she is been followed by Dr. Meyer Russel in this clinic today. She is a type I diabetic with severe PAD macrovascular disease. She has had a previous BKA. She has dry gangrene of the tips of her fingers which she showed me on the right to. She also has dry gangrene of the left first second and third toes and a portion of her proximal foot around these areas. She is followed by vascular surgery at [facility] and saw them recently they are not going to do surgery as of yet. She has a large black eschar over her heel which is beginning to separate in some areas. As I understand think she is paining these with Betadine. There is been some suggestion about retrying hyperbarics on her although she is still not able to commit to the frequency of treatment that would be necessary to see improvement. She is also on Monday Wednesday Friday dialysis 01/21/16; the patient returns to see me today with regards to the left heel. Apparently she is not scheduled for any further attempts at revascularization of the left foot. 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 01/28/16; the patient returns today for attention to the left foot specifically the left calcaneal ulcer. This is covered in a thick  black eschar. I crosshatched this last week and we have been using Santyl. 02/05/16; I continue to work on the thick black eschar on the patient's left calcaneus. She is using Santyl that this side crosshatched this area and the eschar is beginning to loosen. 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 This is beginning to separate and liquefy as well especially between the first and second toes. In the time being her major complaint is fatigue at dialysis 02/26/16 I continue to work on the patient's left calcaneus thick adherent black eschar. I crosshatched this area and we have been using Santyl although it is very adherent area I remove some nonviable tissue today what I can see of this actually looks surprisingly good. On the same foot she has dry gangrene on the first and second toes and part of the forefoot underneath this. This is beginning to  separate. 03/11/16; the patient comes in for her every 2 week appointment. I have been working on the black eschar on her heel. The patient apparently again has to come off dialysis early today after 2 hours due to severe complaints of nausea. She really does not look well. 04/02/2016 -- the patient has not been seen here for about 3 weeks now and has a new issue with the stump of the right amputation site and also her right posterior thigh. They have only noticed this for the last couple of days. 04/28/16; I had received a call from the patient's surgeon at [facility]. She had a left transmetatarsal amputation and Integra applied to the left heel. Both of these areas appear to be doing well. There are dressing these with [facility] and they will return to their surgeon on Thursday. She has a new injury on the popliteal fossa on the right which I think was trauma from her stump. CHARIAH, BAILEY ([id number]) 05/20/16; the  patient is following with her surgeon at [facility]. She's had a left transmetatarsal debridement of the left heel she had Integra place and apparently is using some consternation of Epson salts soaks, Betadine and Aquacel Ag. The left leg is wrapped I did not look at this today. She has the wound in her right popliteal fossa which is apparently a pressure area possibly related to sitting on a toilet for 2-3 hours multiple times a day. 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 We have been using Santyl to the area on the right popliteal fossa. She has a new wound on her right gluteal area but the patient would not let me look at that today 06/03/16; patient is not doing particularly well. She now has a pressure area over her left ischial tuberosity. This is of quite a size and covered with an adherent surface slough. She looks as though she has lost weight, I didn't want to go ahead and attempt to debridement this today. There is really no evidence of infection. The area behind the right popliteal fossa looks about the same as 2 weeks ago we have been using Santyl to this area. I did not look at her left foot which is wrapped been followed by podiatry at [facility] 06/10/16; patient arrives in clinic actually looking a lot brighter than I usually see her, predictably she did not go to dialysis today. For the first time in perhaps 6 weeks I actually saw her left foot today. The transmetatarsal site is healed. The left heel has a reasonably stable-looking wound which has a clean base. Some eschar superiorly and a few sutures remain in place. More worrisome is an area on her lateral left foot over the metatarsal head. Quarter size necrotic wound that probes to bone. There is some purulence here which I cultured there is nothing that looks like a healthy base of this area. They've been using silver alginate to this area at home. She also has a  large wound in the right popliteal fossa, and again a pressure area over the left ischial tuberosity. We are using Santyl to both of these areas. Both allays looks somewhat better than last week, using Santyl to both areas 06/17/16: culture from last week grew citrobacter and amp sensitive enterococcus fecalis. Started on vanc last Saturday at dialysis. have spoken to dialysis in [place] re adjustment in antibiotics added ceftazidine to vanc 06/24/16; the patient was discharged from [facility] yesterday.  She had an IandD of the open area on the lateral aspect of her foot. She continues on vancomycin and Ceptaz again at dialysis although I'm not exactly sure of the current duration of this. She also had a debridement of the wound over the left greater trochanter done by the wound care team. She is receiving Santyl based dressings to this and the area in her right popliteal fossa. She arrives today completely fatigued from dialysis. I spoke to Dr. Allena KatzPatel her podiatrist and surgeon at [facility] and asked if we could manage the wound VAC which I think we can. He also wanted to ask about hyperbaric oxygen with the indication of chronic osteomyelitis although at this point I am not completely certain where the chronic osteomyelitis is. Finally she apparently has had a re-graft to the area on her left heel which is either a skin graft or Integra 07/15/16; patient arrives today she is not been in the clinic since I saw her on 9/27. She's been using Santyl to the left greater trochanter, I popliteal fossa. Her home health nurse remove the wound VAC from the transmetatarsal head, there is only a small wound on the medial aspect of the foot. Dr. Allena KatzPatel who is been managing the heel wounds has requested Xeroform to the heel. 07/22/16; patient arrives as usual postdialysis she is very fatigued and weak looking. She generally tolerates dialysis poorly requiring midodrine tosupport her blood pressure. She continues on  vancomycin and I believe ceftazadine at dialysis for chronic osteomyelitis She has a wound on the lateral left transmetatarsal amputation. This was debrided of surface slough nonviable subcutaneous tissue the base of this looks healthy and improved. Her left heel wound is followed by by Dr. Allena KatzPatel at [facility] her surgeon. We have been applying Xeroform to this at his request, he'll She has a deep wound over her left greater trochanter however this does not appear to be infected has healthy granulation and I think could be well served by a wound VAC with collagen under the foam Under the right popliteal fossa there is a pressure area apparently from a toilet seat. This has been making progress again the tissue here appears to be healthy we have been using silver collagen in this area as well There is a new small area on the lateral aspect of her right stump which appears to be draining purulent material. Our nurses obtained a specimen of this for culture. As noted she is already [facility] e a Diana EvesBLACKBURN, Brenleigh N. ([id number]) third generation cephalosporin 08/05/16 -Ms. Simeon CraftBlackburn arrives today accompanied by her aunt and cousin, she did not receive dialysis today due to "feeling bad" and is scheduled to received dialysis tomorrow. She has completed vancomycin that she was receiving with dialysis. She has an appointment with podiatry on Friday; admits to using right prosthesis for transfers primarily -She has a wound on the lateral left TMA site, essentially unchanged and stable; staples remain in medial aspect of healed surgical site from August, patient states that the plan is to remove staples on Friday -Left heel ulcer appears stable with granulation tissue -stage III left trochanter pressure ulcer with peri-wound irritation, appears to be from leakage despite patient stating that VAC has not leaked, has been using Prisma and wound VAC -Right popliteal fossa pressure ulcer is stable -Right BKA  amp site has area of induration and pale erythema to lateral aspect, improved from last week, although purulence able to be expressed, will encourage warm compresses to aide in bring fluid  to surface - new areas of DTI to left foot dorsal and lateral aspect; patient admits that foot has been wrapped with ace per podiatry, she also wears surgical shoe to left foot; dorsal DTI has intact blister; dorsal DTI not present at last appointment, lateral DTI present but significantly smaller at last appointment; both areas (dorsal>lateral) concerning for threatening limb loss, new to size and location - she has seen "limb loss specialist" at [facility] in the past and was encouraged to follow up with that provider in presence of new development Objective Constitutional BP within normal limits. fatigued, underweight, frail. Vitals Time Taken: 3:51 PM, Height: 68 in, Weight: 96 lbs, BMI: 14.6, Pulse: 70 bpm, Respiratory Rate: 16 breaths/min, Blood Pressure: 103/56 mmHg. Respiratory non-labored respiratory effort. Musculoskeletal decreased muscle tone, deconditioned,unable to walk any distance limited to transfers with assistance of prosthesis and surgical shoe,. Psychiatric oriented to time, place, person and situation. depressive affect, tearful. General Notes: -patient is tearful and emotional expressing frustration with "always getting bad news" -frank discussion had with removing any pressure from left foot, wearing surgical shoe only to transfer safely and to avoid ace wrap Leisey, Kaaliyah N. ([id number]) Integumentary (Hair, Skin) Wound #16 status is Open. Original cause of wound was Pressure Injury. 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 The wound measures 3.2cm length x 2cm width x 0.3cm depth; 5.027cm^2 area and 1.508cm^3 volume. There is fat exposed. There is no tunneling or undermining noted. There is a large amount of serous drainage noted. The wound margin  is distinct with the outline attached to the wound base. There is large (67-100%) red, pink granulation within the wound bed. There is a small (1-33%) amount of necrotic tissue within the wound bed including Adherent Slough. The periwound skin appearance exhibited: Localized Edema, Moist, Erythema. The periwound skin appearance did not exhibit: Callus, Crepitus, Excoriation, Fluctuance, Friable, Induration, Rash, Scarring, Dry/Scaly, Maceration, Atrophie Blanche, Cyanosis, Ecchymosis, Hemosiderin Staining, Mottled, Pallor, Rubor. The surrounding wound skin color is noted with erythema which is circumferential. Periwound temperature was noted as No Abnormality. The periwound has tenderness on palpation. Wound #17 status is Open. Original cause of wound was Surgical Injury. The wound is located on the Left Amputation Site - Transmetatarsal. The wound measures 1cm length x 1cm width x 0.3cm depth; 0.785cm^2 area and 0.236cm^3 volume. The wound is limited to skin breakdown. There is no tunneling or undermining noted. There is a large amount of serous drainage noted. The wound margin is flat and intact. There is no granulation within the wound bed. There is a large (67-100%) amount of necrotic tissue within the wound bed including Adherent Slough. The periwound skin appearance exhibited: Moist. The periwound skin appearance did not exhibit: Callus, Crepitus, Excoriation, Fluctuance, Friable, Induration, Localized Edema, Rash, Scarring, Dry/Scaly, Maceration, Atrophie Blanche, Cyanosis, Ecchymosis, Hemosiderin Staining, Mottled, Pallor, Rubor, Erythema. Wound #18 status is Open. Original cause of wound was Pressure Injury. The wound is located on the Left Ischial Tuberosity. The wound measures 3.5cm length x 2.5cm width x 0.7cm depth; 6.872cm^2 area and 4.811cm^3 volume. The wound is limited to skin breakdown. There is no tunneling noted, however, there is undermining starting at 9:00 and ending at :00  with a maximum distance of 1.1cm. There is a large amount of serosanguineous drainage noted. The wound margin is flat and intact. There is large (67-100%) pink granulation within the wound bed. There is a small (1-33%) amount of necrotic tissue within the wound bed including Adherent  Slough. The periwound skin appearance exhibited: Moist, Erythema. The periwound skin appearance did not exhibit: Callus, Crepitus, Excoriation, Fluctuance, Friable, Induration, Localized Edema, Rash, Scarring, Dry/Scaly, Maceration, Atrophie Blanche, Cyanosis, Ecchymosis, Hemosiderin Staining, Mottled, Pallor, Rubor. The surrounding wound skin color is noted with erythema which is circumferential. Wound #19 status is Open. Original cause of wound was Gradually Appeared. The wound is located on the Right,Lateral Amputation Site - Below Knee. The wound measures 0.1cm length x 0.1cm width x 0.1cm depth; 0.008cm^2 area and 0.001cm^3 volume. The wound is limited to skin breakdown. There is no tunneling or undermining noted. There is a none present amount of drainage noted. The wound margin is flat and intact. There is no granulation within the wound bed. There is a large (67-100%) amount of necrotic tissue within the wound bed including Eschar. The periwound skin appearance exhibited: Localized Edema, Erythema. The periwound skin appearance did not exhibit: Callus, Crepitus, Excoriation, Fluctuance, Friable, Induration, Rash, Scarring, Dry/Scaly, Maceration, Moist, Atrophie Blanche, Cyanosis, Ecchymosis, Hemosiderin Staining, Mottled, Pallor, Rubor. The surrounding wound skin color is noted with erythema which is circumferential. Periwound temperature was noted as No Abnormality. Wound #9 status is Open. Original cause of wound was Pressure Injury. The wound is located on the Left Calcaneous. The wound measures 2.7cm length x 2.5cm width x 0.1cm depth; 5.301cm^2 area and 0.53cm^3 volume. The wound is limited to skin  breakdown. There is no tunneling or undermining noted. There is a large amount of serosanguineous drainage noted. The wound margin is flat and intact. There is medium (34-66%) red granulation within the wound bed. There is a medium (34-66%) amount of necrotic Vandenbrink, Eddye N. ([id number]) tissue within the wound bed including Eschar and Adherent Slough. The periwound skin appearance exhibited: Moist. The periwound skin appearance did not exhibit: Callus, Crepitus, Excoriation, Fluctuance, Friable, Induration, Localized Edema, Rash, Scarring, Dry/Scaly, Maceration, Atrophie Blanche, Cyanosis, Ecchymosis, Hemosiderin Staining, Mottled, Pallor, Rubor, Erythema. Other Condition(s) Patient presents with Suspected Deep Tissue Injury located on the Left top of left foot. General Notes: 2.5x5.5x0.1 Patient presents with Suspected Deep Tissue Injury located on the Left lateral left foot. 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 Cleanse wound with mild soap and water Wound #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: Clean wound with Normal Saline. Cleanse wound with mild soap and  water Wound #18 Left Ischial Tuberosity: Clean wound with Normal Saline. Cleanse wound with mild soap and water Wound #19 Right,Lateral Amputation Site - Below Knee: Schoof, Cenia N. ([id number]) Clean wound with Normal Saline. Cleanse wound with mild soap and water Wound #9 Left Calcaneous: Clean wound with Normal Saline. 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,Posterior Amputation Site - Below Knee: Topical Lidocaine 4% cream applied to wound bed prior  to debridement Wound #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: Topical Lidocaine 4% cream applied to wound bed prior to debridement Wound #18 Left Ischial Tuberosity: Topical Lidocaine 4% cream applied to wound bed prior to debridement Wound #19 Right,Lateral Amputation Site - Below Knee: Topical Lidocaine 4% cream applied to wound bed prior to debridement Wound #9 Left Calcaneous: Topical Lidocaine 4% cream applied to wound bed prior to debridement Primary Wound Dressing: 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,Posterior Amputation Site - Below Knee: Prisma Ag - or collagen with silver equivalent Wound #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: Iodosorb Ointment Wound #18 Left Ischial Tuberosity: Saline moistened gauze - in clinic and HHRN to place wound vac Prisma Ag - or collagen with silver equivalent place under wound vac, place under undermining as well in clinic place Prisma Wound #19 Right,Lateral Amputation Site - Below Knee: Other: - triple antibiotic ointment warm compresses 3x a day 10 minutes at a time Wound #9 Left Calcaneous: Iodosorb Ointment Secondary Dressing: 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 Wound #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: Change dressing every day. Wound #18 Left Ischial Tuberosity: Three times weekly - Monday, Wednesday, Friday Wound #19 Right,Lateral Amputation Site - Below Knee: ELEANNA, THEILEN N. ([id number]) Change dressing every day. Wound #9 Left Calcaneous: Change dressing every day. 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 Other: - as patient is able Wound #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: Return Appointment in 2 weeks. Other: - as patient is able Wound #18 Left Ischial Tuberosity: Return Appointment in 2 weeks. Other: - as patient is able Wound #19 Right,Lateral Amputation Site - Below Knee: Return Appointment in 2 weeks. Other: - as patient is able Wound #9 Left Calcaneous: Return Appointment in 2 weeks. Other: - as patient is able Additional Orders / Instructions: Increase protein intake. 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 FACE TO FACE ENCOUNTER: MEDICARE and MEDICAID PATIENTS: I certify that this patient is under my care and that I had a face-to-face encounter that meets the physician face-to-face encounter requirements with this patient on this date. The encounter with the patient was in whole or in part for the following MEDICAL CONDITION: (primary reason for Home Healthcare) MEDICAL NECESSITY: I certify, that based on my findings, NURSING services are a medically necessary home health service. HOME BOUND STATUS: I certify that my clinical findings support that this patient is homebound (i.e., Due to illness or  injury, pt requires aid of supportive devices such as crutches, cane, wheelchairs, walkers, the use of special transportation or the assistance of another person to leave their place of residence. There is a normal inability to leave the home and doing so requires considerable and taxing effort. Other absences are for medical reasons / religious services and are infrequent or of short duration when for other reasons). If current dressing causes regression in wound condition, may D/C ordered dressing product/s and apply  Normal Saline Moist Dressing daily until next Wound Healing Center / Other MD appointment. Notify Wound Healing Center of regression in wound condition at [phone]. 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 FACE TO FACE ENCOUNTER: MEDICARE and MEDICAID PATIENTS: I certify that this patient is under my care and that I had a face-to-face encounter that meets the physician face-to-face encounter requirements with this patient on this date. The encounter with the patient was in whole or in part for the following MEDICAL CONDITION: (primary reason for Home Healthcare) MEDICAL NECESSITY: I certify, that based on my findings, NURSING services are a medically necessary home health service. HOME BOUND STATUS: I certify that my clinical findings support that this patient is homebound (i.e., Due to illness or injury, pt requires aid of supportive devices such as crutches, cane, wheelchairs, walkers, the use of special transportation or the assistance of another person to leave their place of residence. There is a normal inability to leave the home and doing so requires considerable and taxing effort. Other absences are [name], CANADAY ([id number]) for medical reasons / religious  services and are infrequent or of short duration when for other reasons). If current dressing causes regression in wound condition, may D/C ordered dressing product/s and apply Normal Saline Moist Dressing daily until next Wound Healing Center / Other MD appointment. Notify Wound Healing Center of regression in wound condition at [phone]. Please direct any NON-WOUND related issues/requests for orders to patient's Primary Care Physician Wound #18 Left Ischial Tuberosity: Continue Home Health Visits Home Health Nurse may visit PRN to address patient s wound care needs. FACE TO FACE ENCOUNTER: MEDICARE and MEDICAID PATIENTS: I certify that this patient is under my care and that I had a face-to-face encounter that meets the physician face-to-face encounter requirements with this patient on this date. The encounter with the patient was in whole or in part for the following MEDICAL CONDITION: (primary reason for Home Healthcare) MEDICAL NECESSITY: I certify, that based on my findings, NURSING services are a medically necessary home health service. HOME BOUND STATUS: I certify that my clinical findings support that this patient is homebound (i.e., Due to illness or injury, pt requires aid of supportive devices such as crutches, cane, wheelchairs, walkers, the use of special transportation or the assistance of another person to leave their place of residence. There is a normal inability to leave the home and doing so requires considerable and taxing effort. Other absences are for medical reasons / religious services and are infrequent or of short duration when for other reasons). If current dressing causes regression in wound condition, may D/C ordered dressing product/s and apply Normal Saline Moist Dressing daily until next Wound Healing Center / Other MD appointment. Notify Wound Healing Center of regression in wound condition at [phone]. Please direct any NON-WOUND related issues/requests  for orders to patient's Primary Care Physician Wound #19 Right,Lateral Amputation Site - Below Knee: Continue Home Health Visits Home Health Nurse may visit PRN to address patient s wound care needs. FACE TO FACE ENCOUNTER: MEDICARE and MEDICAID PATIENTS: I certify that this patient is under my care and that I had a face-to-face encounter that meets the physician face-to-face encounter requirements with this patient on this date. The encounter with the patient was in whole or in part for the following MEDICAL CONDITION: (  primary reason for Home Healthcare) MEDICAL NECESSITY: I certify, that based on my findings, NURSING services are a medically necessary home health service. HOME BOUND STATUS: I certify that my clinical findings support that this patient is homebound (i.e., Due to illness or injury, pt requires aid of supportive devices such as crutches, cane, wheelchairs, walkers, the use of special transportation or the assistance of another person to leave their place of residence. There is a normal inability to leave the home and doing so requires considerable and taxing effort. Other absences are for medical reasons / religious services and are infrequent or of short duration when for other reasons). If current dressing causes regression in wound condition, may D/C ordered dressing product/s and apply Normal Saline Moist Dressing daily until next Wound Healing Center / Other MD appointment. Notify Wound Healing Center of regression in wound condition at [phone]. Please direct any NON-WOUND related issues/requests for orders to patient's Primary Care Physician Wound #9 Left Calcaneous: Continue Home Health Visits Home Health Nurse may visit PRN to address patient s wound care needs. FACE TO FACE ENCOUNTER: MEDICARE and MEDICAID PATIENTS: I certify that this patient is under my care and that I had a face-to-face encounter that meets the physician face-to-face encounter requirements  with this patient on this date. The encounter with the patient was in whole or in part for the following MEDICAL CONDITION: (primary reason for Home Healthcare) MEDICAL NECESSITY: I certify, that based on my findings, NURSING services are a medically necessary home health service. HOME BOUND STATUS: I certify that my clinical findings support that this patient is homebound (i.e., Due to illness or injury, pt requires aid of supportive devices such as crutches, cane, wheelchairs, walkers, the use of special transportation or the assistance of another person to leave their place of residence. There is a normal inability to leave the home and doing so requires considerable and taxing effort. Other absences are OLUWATENIOLA, LEITCH ([id number]) for medical reasons / religious services and are infrequent or of short duration when for other reasons). If current dressing causes regression in wound condition, may D/C ordered dressing product/s and apply Normal Saline Moist Dressing daily until next Wound Healing Center / Other MD appointment. Notify Wound Healing Center of regression in wound condition at [phone]. Please direct any NON-WOUND related issues/requests for orders to patient's Primary Care Physician Negative Pressure Wound Therapy: Wound #18 Left Ischial Tuberosity: Wound VAC settings at 125/130 mmHg continuous pressure. Use BLACK/GREEN foam to wound cavity. Use WHITE foam to fill any tunnel/s and/or undermining. Change VAC dressing 3 X WEEK. Change canister as indicated when full. Nurse may titrate settings and frequency of dressing changes as clinically indicated. - place some white foam under the undermining as well as the Prisma place foam around the peri wound Home Health Nurse may d/c VAC for s/s of increased infection, significant wound regression, or uncontrolled drainage. Notify Wound Healing Center at [phone]. Follow-Up Appointments: A follow-up appointment should be  scheduled. A Patient Clinical Summary of Care was provided to HB 1. increase offloading time to left foot and right popliteal fossa 2. continue offloading to left trochanter 3. maintain follow up with poditary 4. schedule appointment with "limb loss specialist" Electronic Signature(s) Signed: 08/10/2016 3:11:39 PM By: Penne Lash, NP, Lorain Keast Previous Signature: 08/06/2016 4:53:59 PM Version By: Penne Lash, NP, Araf Clugston Previous Signature: 08/06/2016 2:01:40 AM Version By: Bonnell Public Entered By: Penne Lash, NP, [name] on 08/10/2016 15:11:39 [name] ([id number]) -------------------------------------------------------------------------------- SuperBill Details Patient Name: [name]  N. Date of Service: 08/05/2016 Medical Record Number: [id number] Patient Account Number: [id number] Date of Birth/Sex: 1981/01/19 (35 y.o. 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, right lower extremity S71.101A Unspecified open wound, right thigh, initial encounter L89.322 Pressure ulcer of left buttock, stage 2 L89.899 Pressure ulcer of other site, unspecified stage L89.223 Pressure ulcer of left hip, stage 3 Facility Procedures CPT4 Code: [id number] Description: 32440 - WOUND CARE VISIT-LEV 5 EST PT Modifier: Quantity: 1 Physician Procedures CPT4 Code:  [id number] Description: 99214 - WC PHYS LEVEL 4 - EST PT ICD-10 Description Diagnosis L89.899 Pressure ulcer of other site, unspecified stage E10.621 Type 1 diabetes mellitus with foot ulcer Z89.511 Acquired absence of right leg below knee L89.223 Pressure ulcer of  left hip, stage 3 Modifier: Quantity: 1 Electronic Signature(s) Signed: 08/06/2016 2:09:00 AM By: Bonnell Public Previous Signature: 08/06/2016 2:08:03 AM Version By: Bonnell Public Previous Signature: 08/06/2016 2:03:37 AM Version By: Unice Bailey, Ledell Peoples ([id number]) Entered By: Bonnell Public on 08/06/2016 02:09:00

## 2016-08-06 NOTE — Progress Notes (Addendum)
[name], [name] ([id number]) Visit Report for 08/05/2016 Arrival Information Details [name] Date of Service: 08/05/2016 3:45 PM Patient Name: N. Patient Account Number: [id number] Medical Record Treating RN: [name] [id number] Number: Other Clinician: 04-24-1981 (35 y.o. [name] Date of Birth/Sex: Female) Physician/Extender: G Primary Care [name] Physician: Referring Physician: [name] Wu in Treatment: 48 Visit Information History Since Last Visit All ordered tests and consults were completed: No Patient Arrived: Wheel Chair Added or deleted any medications: No Arrival Time: 15:48 Any new allergies or adverse reactions: No Accompanied By: aunt Had a fall or experienced change in No Transfer Assistance: [employer] activities of daily living that may affect Patient Lift risk of falls: Patient Identification Verified: Yes Signs or symptoms of abuse/neglect since last No Secondary Verification Process Yes visito Completed: Hospitalized since last visit: No Patient Requires Transmission- No Pain Present Now: No Based Precautions: Patient Has Alerts: Yes Electronic Signature(s) Signed: 08/05/2016 5:51:11 PM By: [name] Entered By: [name] on 08/05/2016 15:50:47 [name] ([id number]) -------------------------------------------------------------------------------- Clinic Level of Care Assessment Details Patient Name: [name] Date of Service: 08/05/2016 3:45 PM Medical Record Number: [id number] Patient Account Number: [id number] Date of Birth/Sex: 1980-11-19 (35 y.o. Female) Treating RN: [name] Primary Care Physician: [name] Other Clinician: Referring Physician: Rolin Wu Treating Physician/Extender: [name] in Treatment: 48 Clinic Level of Care Assessment Items TOOL 4 Quantity Score []  - Use when only an EandM is performed on FOLLOW-UP visit  0 ASSESSMENTS - Nursing Assessment / Reassessment X - Reassessment of Co-morbidities (includes updates in patient status) 1 10 X - Reassessment of Adherence to Treatment Plan 1 5 ASSESSMENTS - Wound and Skin Assessment / Reassessment []  - Simple Wound Assessment / Reassessment - one wound 0 X - Complex Wound Assessment / Reassessment - multiple wounds 5 5 []  - Dermatologic / Skin Assessment (not related to wound area) 0 ASSESSMENTS - Focused Assessment []  - Circumferential Edema Measurements - multi extremities 0 []  - Nutritional Assessment / Counseling / Intervention 0 []  - Lower Extremity Assessment (monofilament, tuning fork, pulses) 0 []  - Peripheral Arterial Disease Assessment (using hand held doppler) 0 ASSESSMENTS - Ostomy and/or Continence Assessment and Care []  - Incontinence Assessment and Management 0 []  - Ostomy Care Assessment and Management (repouching, etc.) 0 PROCESS - Coordination of Care X - Simple Patient / Family Education for ongoing care 1 15 []  - Complex (extensive) Patient / Family Education for ongoing care 0 []  - Staff obtains , Records, Test Results / Process Orders 0 []  - Staff telephones HHA, Nursing Homes / Clarify orders / etc 0 []  - Routine Transfer to another Facility (non-emergent condition) 0 Coupe, [name] ( ) []  - Routine Hospital Admission (non-emergent condition) 0 []  - New Admissions / / Ordering NPWT, Apligraf, etc. 0 []  - Emergency Hospital Admission (emergent condition) 0 X - Simple Discharge Coordination 1 10 []  - Complex (extensive) Discharge Coordination 0 PROCESS - Special Needs []  - Pediatric / Minor Patient Management 0 []  - Isolation Patient Management 0 []  - Hearing / Language / Visual special needs 0 []  - Assessment of Community assistance (transportation, D/C planning, etc.) 0 []  - Additional assistance / Altered mentation 0 []  - Support Surface(s) Assessment (bed, cushion, seat,  etc.) 0 INTERVENTIONS - Wound Cleansing / Measurement []  - Simple Wound Cleansing - one wound 0 X - Complex Wound Cleansing - multiple wounds 5 5 X - Wound Imaging (photographs - any number of  wounds) 1 5 []  - Wound Tracing (instead of photographs) 0 []  - Simple Wound Measurement - one wound 0 X - Complex Wound Measurement - multiple wounds 5 5 INTERVENTIONS - Wound Dressings X - Small Wound Dressing one or multiple wounds 5 10 []  - Medium Wound Dressing one or multiple wounds 0 []  - Large Wound Dressing one or multiple wounds 0 []  - Application of Medications - topical 0 []  - Application of Medications - injection 0 INTERVENTIONS - Miscellaneous []  - External ear exam 0 [name], [name] ([id number]) []  - Specimen Collection (cultures, biopsies, blood, body fluids, etc.) 0 []  - Specimen(s) / Culture(s) sent or taken to Lab for analysis 0 []  - Patient Transfer (multiple staff / Michiel Wu Lift / Similar devices) 0 []  - Simple Staple / Suture removal (25 or less) 0 []  - Complex Staple / Suture removal (26 or more) 0 []  - Hypo / Hyperglycemic Management (close monitor of Blood Glucose) 0 []  - Ankle / Brachial Index (ABI) - do not check if billed separately 0 X - Vital Signs 1 5 Has the patient been seen at the hospital within the last three years: Yes Total Score: 175 Level Of Care: New/Established - Level 5 Electronic Signature(s) Signed: 08/05/2016 5:47:50 PM By: [name] Entered By: [name] on 08/05/2016 17:18:30 [name] ([id number]) -------------------------------------------------------------------------------- Encounter Discharge Information Details Patient Name: [name] Date of Service: 08/05/2016 3:45 PM Medical Record Number: [id number] Patient Account Number: [id number] Date of Birth/Sex: 1981/09/20 (35 y.o. Female) Treating RN: [name] Primary Care Physician: [name] Other Clinician: Referring Physician: Rolin Wu Treating Physician/Extender: [name] in Treatment: 89 Encounter Discharge Information Items Discharge Pain Level: 0 Discharge Condition: Stable Ambulatory Status: Wheelchair Discharge Destination: Home Transportation: Private Auto Accompanied By: aunt Schedule Follow-up Appointment: Yes Medication Reconciliation completed and provided to Patient/Care No Shelah Heatley: Provided on Clinical Summary of Care: 08/05/2016 Form Type Recipient Paper Patient HB Electronic Signature(s) Signed: 08/05/2016 5:18:59 PM By: [name] Previous Signature: 08/05/2016 5:03:01 PM Version By: Gwenlyn Perking Entered By: [name] on 08/05/2016 17:18:59 [name] ([id number]) -------------------------------------------------------------------------------- Lower Extremity Assessment Details [name] Date of Service: 08/05/2016 3:45 PM Patient Name: N. Patient Account Number: [id number] Medical Record Treating RN: [name] [id number] Number: Other Clinician: 1981/06/03 (35 y.o. [name] Date of Birth/Sex: Female) Physician/Extender: G Primary Care [name] Physician: Referring Physician: [name] Wu in Treatment: 48 Vascular Assessment Pulses: Posterior Tibial Dorsalis Pedis Palpable: [Left:Yes] Extremity colors, hair growth, and conditions: Extremity Color: [Left:Normal] Temperature of Extremity: [Left:Warm] Electronic Signature(s) Signed: 08/05/2016 5:51:11 PM By: [name] Entered By: [name] on 08/05/2016 15:54:37 [name] ([id number]) -------------------------------------------------------------------------------- Multi Wound Chart Details Patient Name: [name] Date of Service: 08/05/2016 3:45 PM Medical Record Number: [id number] Patient Account Number: [id number] Date of Birth/Sex: April 17, 1981 (35 y.o. 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] Wounding Event: [16:Pressure Injury] [17:Surgical Injury] [18:Pressure Injury] Primary Etiology: [16:Pressure Ulcer] [17:Open Surgical Wound] [18:Pressure Ulcer] Comorbid History: [16:Anemia, Type I Diabetes, Anemia, Type I Diabetes, Anemia, Type I Diabetes, End Stage Renal Disease, End Stage Renal Disease,  End Stage Renal Disease, Rheumatoid Arthritis, Neuropathy] [17:Rheumatoid Arthritis, Rheumatoid Arthritis,  Neuropathy] [18:Neuropathy] Date Acquired: [16:04/02/2016] [17:04/13/2016] [18:05/12/2016] Weeks of Treatment: [16:17] [17:14] [18:9] Wound Status: [16:Open] [17:Open] [18:Open] Measurements L x W x D 3.2x2x0.3 [17:1x1x0.3] [18:3.5x2.5x0.7] (cm) Area (cm) : [16:5.027] [17:0.785] [18:6.872] Volume (cm) : [16:1.508] [17:0.236] [18:4.811] % Reduction in Area: [16:15.30%] [17:6.50%] [18:48.80%] % Reduction in Volume: -153.90% [17:-181.00%] [18:-258.20%] Classification: [16:Category/Stage III] [17:Partial Thickness] [18:Category/Stage II] HBO Classification: [16:Grade 1] [17:Grade 1] [18:N/A] Exudate Amount: [16:Large] [17:Large] [18:Large] Exudate Type: [16:Serous] [17:Serous] [18:Serosanguineous] Exudate Color: [16:amber] [17:amber] [18:red, brown] Wound Margin: [16:Distinct, outline attached Flat and Intact] [18:Flat and Intact] Granulation Amount: [16:Large (67-100%)] [17:None Present (0%)] [18:Large (67-100%)] Granulation Quality: [16:Red, Pink] [17:N/A] [18:Pink] Necrotic Amount: [16:Small  (1-33%)] [17:Large (67-100%)] [18:Small (1-33%)] Necrotic Tissue: [16:Adherent Slough] [17:Adherent Slough] [18:Adherent Slough] Exposed Structures: [16:Fat: Yes] [17:Fascia: No Fat: No] [18:Fascia: No Fat: No] Tendon: No Tendon: No Muscle: No Muscle: No Joint: No Joint: No Bone: No Bone: No Limited to Skin Limited to Skin Breakdown Breakdown Epithelialization: None None Small (1-33%) Periwound Skin Texture: Edema: Yes Edema: No Edema: No Excoriation: No Excoriation: No Excoriation: No Induration: No Induration: No Induration: No Callus: No Callus: No Callus: No Crepitus: No Crepitus: No Crepitus: No Fluctuance: No Fluctuance: No Fluctuance: No Friable: No Friable: No Friable: No Rash: No Rash: No Rash: No Scarring: No Scarring: No Scarring: No Periwound Skin Moist: Yes Moist: Yes Moist: Yes Moisture: Maceration: No Maceration: No Maceration: No Dry/Scaly: No Dry/Scaly: No Dry/Scaly: No Periwound Skin Color: Erythema: Yes Atrophie Blanche: No Erythema: Yes Atrophie Blanche: No Cyanosis: No Atrophie Blanche: No Cyanosis: No Ecchymosis: No Cyanosis: No Ecchymosis: No Erythema: No Ecchymosis: No Hemosiderin Staining: No Hemosiderin Staining: No Hemosiderin Staining: No Mottled: No Mottled: No Mottled: No Pallor: No Pallor: No Pallor: No Rubor: No Rubor: No Rubor: No Erythema Location: Circumferential N/A Circumferential Erythema Change: No Change N/A N/A Temperature: No Abnormality N/A N/A Tenderness on Yes No No Palpation: Wound Preparation: Ulcer Cleansing: Ulcer Cleansing: Ulcer Cleansing: Rinsed/Irrigated with Rinsed/Irrigated with Rinsed/Irrigated with Saline Saline Saline Topical Anesthetic Topical Anesthetic Topical Anesthetic Applied: None, Other: Applied: Other: lidocaine Applied: Other: lidocaine lidocaine 4% 4% 4% Wound Number: [id number] N/A Photos: No Photos No Photos N/A Wound Location: Right Amputation Site - Left Calcaneous  N/A Below Knee - Lateral Wounding Event: Gradually Appeared Pressure Injury N/A 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 ([id number]) Rheumatoid Arthritis, Rheumatoid Arthritis, Neuropathy Neuropathy Date Acquired: 07/22/2016 07/28/2015 N/A Weeks of Treatment: 2 48 N/A Wound Status: Open Open N/A Measurements L x W x D 0.1x0.1x0.1 2.7x2.5x0.1 N/A (cm) Area (cm) : 0.008 5.301 N/A Volume (cm) : 0.001 0.53 N/A % Reduction in Area: 74.20% 71.50% N/A % Reduction in Volume: 66.70% 71.50% N/A Classification: Grade 2 Grade 2 N/A HBO Classification: N/A N/A N/A Exudate Amount: None Present Large N/A Exudate Type: N/A Serosanguineous N/A Exudate Color: N/A red, brown N/A Wound Margin: Flat and Intact Flat and Intact N/A Granulation Amount: None Present (0%) Medium (34-66%) N/A Granulation Quality: N/A Red N/A Necrotic Amount: Large (67-100%) Medium (34-66%) N/A Necrotic Tissue: Eschar Eschar, Adherent Slough N/A Exposed Structures: Fascia: No Fascia: No N/A Fat: No Fat: No Tendon: No Tendon: No Muscle: No Muscle: No Joint: No Joint: No Bone: No Bone: No Limited to Skin Limited to Skin Breakdown Breakdown  Epithelialization: None None N/A Periwound Skin Texture: Edema: Yes Edema: No N/A Excoriation: No Excoriation: No Induration: No Induration: No Callus: No Callus: No Crepitus: No Crepitus: No Fluctuance: No Fluctuance: No Friable: No Friable: No Rash: No Rash: No Scarring: No Scarring: No Periwound Skin Maceration: No Moist: Yes N/A Moisture: Moist: No Maceration: No Dry/Scaly: No Dry/Scaly: No Periwound Skin Color: Erythema: Yes Atrophie Blanche: No N/A Atrophie Blanche: No Cyanosis: No Cyanosis: No Ecchymosis: No Ecchymosis: No Erythema:  No Hemosiderin Staining: No Hemosiderin Staining: No Mottled: No Mottled: No Pallor: No Pallor: No Rubor: No Rubor: No Robinson, Kim N. ([id number]) Erythema Location: Circumferential N/A N/A Erythema Change: N/A N/A N/A Temperature: No Abnormality N/A N/A Tenderness on No No N/A Palpation: Wound Preparation: Ulcer Cleansing: Ulcer Cleansing: N/A Rinsed/Irrigated with Rinsed/Irrigated with Saline Saline Topical Anesthetic Topical Anesthetic Applied: Other: lidocaine Applied: Other: lidocaine 4% 4% Treatment Notes Electronic Signature(s) Signed: 08/05/2016 5:51:11 PM By: [name] MullingPinkerton, Debra Entered By: [name] MullingPinkerton, Debra on 08/05/2016 16:23:09 [name] EvesBLACKBURN, Koula N. ([id number]) -------------------------------------------------------------------------------- Multi-Disciplinary Care Plan Details Patient Name: [name] EvesBLACKBURN, Ashanty N. Date of Service: 08/05/2016 3:45 PM Medical Record Number: [id number] Patient Account Number: [id number] Date of Birth/Sex: Mar 11, 1981 (35 y.o. Female) Treating RN: Ashok CordiaPinkerton, Debi Primary Care Physician: [name] Barrylmedo, Mario Other Clinician: Referring Physician: Rolin Barrylmedo, Mario Treating Physician/Extender: [name] Cosieroulter, Leah Weeks in Treatment: 4048 Active Inactive HBO Nursing Diagnoses: Anxiety related to feelings of confinement associated with the hyperbaric oxygen chamber Anxiety related to knowledge deficit of hyperbaric oxygen therapy and treatment procedures Discomfort related to temperature and humidity changes inside hyperbaric chamber Potential for barotraumas to ears, sinuses, teeth, and lungs or cerebral gas embolism related to changes in atmospheric pressure inside hyperbaric oxygen chamber Potential for oxygen toxicity seizures related to delivery of 100% oxygen at an increased atmospheric pressure Potential for pulmonary oxygen toxicity related to delivery of 100% oxygen at an increased atmospheric pressure Goals: Barotrauma will be prevented  during HBO2 Date Initiated: 08/30/2015 Goal Status: Active Patient and/or family will be able to state/discuss factors appropriate to the management of their disease process during treatment Date Initiated: 08/30/2015 Goal Status: Active Patient will tolerate the hyperbaric oxygen therapy treatment Date Initiated: 08/30/2015 Goal Status: Active Patient will tolerate the internal climate of the chamber Date Initiated: 08/30/2015 Goal Status: Active Patient/caregiver will verbalize understanding of HBO goals, rationale, procedures and potential hazards Date Initiated: 08/30/2015 Goal Status: Active Signs and symptoms of pulmonary oxygen toxicity will be recognized and promptly addressed Date Initiated: 08/30/2015 Goal Status: Active Signs and symptoms of seizure will be recognized and promptly addressed ; seizing patients will suffer no harm Date Initiated: 08/30/2015 [name] EvesBLACKBURN, Marisabel N. ([id number]) Goal Status: Active Interventions: Administer a five (5) minute air break for patient if signs and symptoms of seizure appear and notify the hyperbaric physician Administer a ten (10) minute air break for patient if signs and symptoms of seizure appear and notify the hyperbaric physician Administer decongestants, per physician orders, prior to HBO2 Administer the correct therapeutic gas delivery based on the patients needs and limitations, per physician order Assess and provide for patientos comfort related to the hyperbaric environment and equalization of middle ear Assess for signs and symptoms related to adverse events, including but not limited to confinement anxiety, pneumothorax, oxygen toxicity and baurotrauma Assess patient for any history of confinement anxiety Assess patient's knowledge and expectations regarding hyperbaric medicine and provide education related to the hyperbaric environment, goals of treatment and prevention of adverse events Implement protocols to decrease risk  of pneumothorax in high  risk patients Notes: Abuse / Safety / Falls / Self Care Management Nursing Diagnoses: Potential for falls Goals: Patient will remain injury free Date Initiated: 09/19/2015 Goal Status: Active Patient/caregiver will verbalize/demonstrate measures taken to prevent injury and/or falls Date Initiated: 09/19/2015 Goal Status: Active Interventions: Assess fall risk on admission and as needed Assess impairment of mobility on admission and as needed per policy Notes: Orientation to the Wound Care Program Nursing Diagnoses: Knowledge deficit related to the wound healing center program Goals: Patient/caregiver will verbalize understanding of the Wound Healing Center Program [place] ([id number]) Date Initiated: 08/30/2015 Goal Status: Active Interventions: Provide education on orientation to the wound center Notes: Pressure Nursing Diagnoses: Knowledge deficit related to causes and risk factors for pressure ulcer development Knowledge deficit related to management of pressures ulcers Potential for impaired tissue integrity related to pressure, friction, moisture, and shear Goals: Patient will remain free from development of additional pressure ulcers Date Initiated: 08/30/2015 Goal Status: Active Patient will remain free of pressure ulcers Date Initiated: 08/30/2015 Goal Status: Active Patient/caregiver will verbalize risk factors for pressure ulcer development Date Initiated: 08/30/2015 Goal Status: Active Patient/caregiver will verbalize understanding of pressure ulcer management Date Initiated: 08/30/2015 Goal Status: Active Interventions: Assess: immobility, friction, shearing, incontinence upon admission and as needed Assess offloading mechanisms upon admission and as needed Assess potential for pressure ulcer upon admission and as needed Provide education on pressure ulcers Treatment Activities: Patient referred for home evaluation of  offloading devices/mattresses : 01/15/2016 Patient referred for pressure reduction/relief devices : 01/15/2016 Pressure reduction/relief device ordered : 01/15/2016 Notes: Wound/Skin Impairment Nursing Diagnoses: Impaired tissue integrity Ringer, Janisse N. ([id number]) Knowledge deficit related to ulceration/compromised skin integrity Goals: Patient will have a decrease in wound volume by X% from date: (specify in notes) Date Initiated: 08/30/2015 Goal Status: Active Patient/caregiver will verbalize understanding of skin care regimen Date Initiated: 08/30/2015 Goal Status: Active Ulcer/skin breakdown will have a volume reduction of 30% by week 4 Date Initiated: 08/30/2015 Goal Status: Active Ulcer/skin breakdown will have a volume reduction of 50% by week 8 Date Initiated: 08/30/2015 Goal Status: Active Ulcer/skin breakdown will have a volume reduction of 80% by week 12 Date Initiated: 08/30/2015 Goal Status: Active Ulcer/skin breakdown will heal within 14 weeks Date Initiated: 08/30/2015 Goal Status: Active Interventions: Assess patient/caregiver ability to obtain necessary supplies Assess patient/caregiver ability to perform ulcer/skin care regimen upon admission and as needed Assess ulceration(s) every visit Provide education on ulcer and skin care Notes: Electronic Signature(s) Signed: 08/05/2016 5:51:11 PM By: [name] Entered By: [name] on 08/05/2016 16:19:16 [name] ([id number]) -------------------------------------------------------------------------------- Non-Wound Condition Assessment Details Patient Name: [name] Date of Service: 08/05/2016 3:45 PM Medical Record Number: [id number] Patient Account Number: [id number] Date of Birth/Gender: 06-13-1981 (35 y.o. Female) Treating RN: Ashok Cordia, Debi Primary Care Physician: [name] Other Clinician: Referring Physician: Rolin Wu Treating Physician/Extender: [name] in Treatment: 48 Non-Wound Condition: Condition: Suspected Deep Tissue Injury Location: Other: top of left foot Side: Left Photos Periwound Skin Texture Texture Color No Abnormalities Noted: No No Abnormalities Noted: No Moisture No Abnormalities Noted: No Notes 2.5x5.5x0.1 Electronic Signature(s) Signed: 08/05/2016 5:51:11 PM By: [name] Entered By: [name] on 08/05/2016 17:21:59 [name] ([id number]) -------------------------------------------------------------------------------- Non-Wound Condition Assessment Details Patient Name: [name] Date of Service: 08/05/2016 3:45 PM Medical Record Number: [id number] Patient Account Number: [id number] Date of Birth/Gender: Sep 25, 1981 (35 y.o. Female) Treating RN: Ashok Cordia, Debi Primary Care Physician: [name] Other Clinician: Referring Physician: Zada Finders  Mario Treating Physician/Extender: Bonnell Public Weeks in Treatment: 48 Non-Wound Condition: Condition: Suspected Deep Tissue Injury Location: Other: lateral left foot Side: Left Photos Periwound Skin Texture Texture Color No Abnormalities Noted: No No Abnormalities Noted: No Moisture No Abnormalities Noted: No Notes 6.5x6.3x0.1 Electronic Signature(s) Signed: 08/05/2016 5:51:11 PM By: [name] Entered By: [name] on 08/05/2016 17:21:59 [name] ([id number]) -------------------------------------------------------------------------------- Pain Assessment Details [name] Date of Service: 08/05/2016 3:45 PM Patient Name: N. Patient Account Number: [id number] Medical Record Treating RN: [name] [id number] Number: Other Clinician: 30-Jul-1981 (36 y.o. [name] Date of Birth/Sex: Female) Physician/Extender: G Primary Care [name] Physician: Referring Physician: [name] Wu in Treatment: 23 Active Problems Location of Pain Severity  and Description of Pain Patient Has Paino No Site Locations With Dressing Change: No Pain Management and Medication Current Pain Management: Electronic Signature(s) Signed: 08/05/2016 5:51:11 PM By: [name] Entered By: [name] on 08/05/2016 15:51:10 [name] ([id number]) -------------------------------------------------------------------------------- Patient/Caregiver Education Details Patient Name: [name] Date of Service: 08/05/2016 3:45 PM Medical Record Number: [id number] Patient Account Number: [id number] Date of Birth/Gender: [birth year] (35 y.o. Female) Treating RN: [name] Primary Care Physician: [name] Other Clinician: Referring Physician: Rolin Wu Treating Physician/Extender: [name] in Treatment: 55 Education Assessment Education Provided To: Patient and Caregiver Education Topics Provided Wound/Skin Impairment: Handouts: Other: wound care as ordered Methods: Demonstration, Explain/Verbal Responses: State content correctly Electronic Signature(s) Signed: 08/05/2016 5:47:50 PM By: [name] Entered By: [name] on 08/05/2016 17:20:00 [name] ([id number]) -------------------------------------------------------------------------------- Wound Assessment Details Patient Name: [name] Date of Service: 08/05/2016 3:45 PM Medical Record Number: [id number] Patient Account Number: [id number] Date of Birth/Sex: 1981/08/30 (35 y.o. 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 Wound Open Status: Wounding Event: Pressure Injury Comorbid Anemia, Type I Diabetes, End Stage Date Acquired:  04/02/2016 History: Renal Disease, Rheumatoid Arthritis, Weeks Of Treatment: 17 Neuropathy Clustered Wound: No Photos Photo Uploaded By: [name] on 08/05/2016 17:16:39 Wound Measurements Length: (cm) 3.2 Width: (cm) 2 Depth: (cm) 0.3 Area: (cm) 5.027 Volume: (cm) 1.508 % Reduction in Area: 15.3% % Reduction in Volume: -153.9% Epithelialization: None Tunneling: No Undermining: No Wound Description Classification: Category/Stage III Foul O Diabetic Severity (Wagner): Grade 1 Wound Margin: Distinct, outline attached Exudate Amount: Large Exudate Type: Serous Exudate Color: amber dor After Cleansing: No Wound Bed Granulation Amount: Large (67-100%) Exposed Structure Granulation Quality: Red, Pink Fat Layer Exposed: Yes Spanier, Wyona N. ([id number]) Necrotic Amount: Small (1-33%) Necrotic Quality: Adherent Slough Periwound Skin Texture Texture Color No Abnormalities Noted: No No Abnormalities Noted: No Callus: No Atrophie Blanche: No Crepitus: No Cyanosis: No Excoriation: No Ecchymosis: No Fluctuance: No Erythema: Yes Friable: No Erythema Location: Circumferential Induration: No Erythema Change: No Change Localized Edema: Yes Hemosiderin Staining: No Rash: No Mottled: No Scarring: No Pallor: No Rubor: No Moisture No Abnormalities Noted: No Temperature / Pain Dry / Scaly: No Temperature: No Abnormality Maceration: No Tenderness on Palpation: Yes Moist: Yes Wound Preparation Ulcer Cleansing: Rinsed/Irrigated with Saline Topical Anesthetic Applied: None, Other: lidocaine 4%, Treatment Notes 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 Cleansed with: Clean wound with Normal Saline 2. Anesthetic Topical Lidocaine 4% cream to wound bed prior to debridement 4. Dressing Applied: Prisma Ag 5. Secondary  Dressing Applied Gauze and Kerlix/Conform 7. Secured with Secretary/administrator) Signed: 08/05/2016 5:51:11 PM By:  [name] Entered By: [name] on 08/05/2016 16:03:50 [name] ([id number]) -------------------------------------------------------------------------------- Wound Assessment Details Patient Name: [name] Date of Service: 08/05/2016 3:45 PM Medical Record Number: [id number] Patient Account Number: [id number] Date of Birth/Sex: 1981/02/01 (35 y.o. Female) Treating RN: Ashok Cordia, Debi Primary Care Physician: [name] Other Clinician: Referring Physician: Rolin Wu Treating Physician/Extender: [name] in Treatment: 48 Wound Status Wound Number: 17 Primary Open Surgical Wound Etiology: Wound Location: Left Amputation Site - Transmetatarsal Wound Open Status: Wounding Event: Surgical Injury Comorbid Anemia, Type I Diabetes, End Stage Date Acquired: 04/13/2016 History: Renal Disease, Rheumatoid Arthritis, Weeks Of Treatment: 14 Neuropathy Clustered Wound: No Photos Photo Uploaded By: [name] on 08/05/2016 17:17:16 Wound Measurements Length: (cm) 1 Width: (cm) 1 Depth: (cm) 0.3 Area: (cm) 0.785 Volume: (cm) 0.236 % Reduction in Area: 6.5% % Reduction in Volume: -181% Epithelialization: None Tunneling: No Undermining: No Wound Description Classification: Partial Thickness Foul Odor A Diabetic Severity (Wagner): Grade 1 Wound Margin: Flat and Intact Exudate Amount: Large Exudate Type: Serous Exudate Color: amber fter Cleansing: No Wound Bed Granulation Amount: None Present (0%) Exposed Structure Necrotic Amount: Large (67-100%) Fascia Exposed: No Nobbe, Sheba N. ([id number]) Necrotic Quality: Adherent Slough Fat Layer Exposed: No Tendon Exposed: No Muscle Exposed: No Joint Exposed: No Bone Exposed: No Limited to Skin Breakdown Periwound Skin Texture Texture Color No Abnormalities Noted: No No Abnormalities Noted: No Callus: No Atrophie Blanche: No Crepitus: No Cyanosis:  No Excoriation: No Ecchymosis: No Fluctuance: No Erythema: No Friable: No Hemosiderin Staining: No Induration: No Mottled: No Localized Edema: No Pallor: No Rash: No Rubor: No Scarring: No Moisture No Abnormalities Noted: No Dry / Scaly: No Maceration: No Moist: Yes Wound Preparation Ulcer Cleansing: Rinsed/Irrigated with Saline Topical Anesthetic Applied: Other: lidocaine 4%, Treatment Notes 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 Cleansed with: Clean wound with Normal Saline 2. Anesthetic Topical Lidocaine 4% cream to wound bed prior to debridement 4. Dressing Applied: Iodosorb Ointment 5. Secondary Dressing Applied Bordered Foam Dressing Dry Gauze Electronic Signature(s) Signed: 08/05/2016 5:51:11 PM By: [name] Entered By: [name] on 08/05/2016 16:11:45 [name] ([id number]) -------------------------------------------------------------------------------- Wound Assessment Details Patient Name: [name] Date of Service: 08/05/2016 3:45 PM Medical Record Number: [id number] Patient Account Number: [id number] Date of Birth/Sex: 1981-04-26 (35 y.o. Female) Treating RN: Ashok Cordia, Debi Primary Care Physician: [name] Other Clinician: Referring Physician: Rolin Wu Treating Physician/Extender: [name] in Treatment: 48 Wound Status Wound Number: 18 Primary Pressure Ulcer Etiology: Wound Location: Left Ischial Tuberosity Wound Open Wounding Event: Pressure Injury Status: Date Acquired: 05/12/2016 Comorbid Anemia, Type I Diabetes, End Stage Weeks Of Treatment: 9 History: Renal Disease, Rheumatoid Arthritis, Clustered Wound: No Neuropathy Photos Photo Uploaded By: [name] on 08/05/2016 17:17:16 Wound Measurements Length: (cm) 3.5 % Reduction in Width: (cm) 2.5 % Reduction in Depth: (cm) 0.7 Epithelializati Area: (cm) 6.872 Tunneling: Volume: (cm)  4.811 Undermining: Starting Pos Maximum Dist Area: 48.8% Volume: -258.2% on: Small (1-33%) No Yes ition (o'clock): 9 ance: (cm) 1.1 Wound Description Classification: Category/Stage II Wound Margin: Flat and Intact Exudate Amount: Large Exudate Type: Serosanguineous Exudate Color: red, brown Wound Bed Granulation Amount: Large (67-100%) Exposed Structure Steve, Hadiyah N. ([id number]) Granulation Quality: Pink Fascia Exposed: No Necrotic Amount: Small (1-33%) Fat Layer Exposed: No Necrotic Quality: Adherent Slough Tendon Exposed: No Muscle Exposed: No Joint Exposed: No Bone Exposed: No  Limited to Skin Breakdown Periwound Skin Texture Texture Color No Abnormalities Noted: No No Abnormalities Noted: No Callus: No Atrophie Blanche: No Crepitus: No Cyanosis: No Excoriation: No Ecchymosis: No Fluctuance: No Erythema: Yes Friable: No Erythema Location: Circumferential Induration: No Hemosiderin Staining: No Localized Edema: No Mottled: No Rash: No Pallor: No Scarring: No Rubor: No Moisture No Abnormalities Noted: No Dry / Scaly: No Maceration: No Moist: Yes Wound Preparation Ulcer Cleansing: Rinsed/Irrigated with Saline Topical Anesthetic Applied: Other: lidocaine 4%, Treatment Notes Wound #18 (Left Ischial Tuberosity) 1. Cleansed with: Clean wound with Normal Saline 2. Anesthetic Topical Lidocaine 4% cream to wound bed prior to debridement 4. Dressing Applied: Prisma Ag 5. Secondary Dressing Applied Bordered Foam Dressing Foam Notes foam to periwound skin for protection Electronic Signature(s) Signed: 08/05/2016 5:51:11 PM By: Valera Castle, Ledell Peoples ([id number]) Entered By: [name] on 08/05/2016 16:52:49 [name] ([id number]) -------------------------------------------------------------------------------- Wound Assessment Details Patient Name: [name] Date of Service: 08/05/2016 3:45  PM Medical Record Number: [id number] Patient Account Number: [id number] Date of Birth/Sex: 07/27/1981 (35 y.o. 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 - Lateral Wound Open Status: Wounding Event: Gradually Appeared Comorbid Anemia, Type I Diabetes, End Stage Date Acquired: 07/22/2016 History: Renal Disease, Rheumatoid Arthritis, Weeks Of Treatment: 2 Neuropathy Clustered Wound: No Photos Photo Uploaded By: [name] on 08/05/2016 17:18:23 Wound Measurements Length: (cm) 0.1 Width: (cm) 0.1 Depth: (cm) 0.1 Area: (cm) 0.008 Volume: (cm) 0.001 % Reduction in Area: 74.2% % Reduction in Volume: 66.7% Epithelialization: None Tunneling: No Undermining: No Wound Description Classification: Grade 2 Wound Margin: Flat and Intact Exudate Amount: None Present Foul Odor After Cleansing: No Wound Bed Granulation Amount: None Present (0%) Exposed Structure Necrotic Amount: Large (67-100%) Fascia Exposed: No Necrotic Quality: Eschar Fat Layer Exposed: No Tendon Exposed: No Muscle Exposed: No Russum, Lashell N. ([id number]) Joint Exposed: No Bone Exposed: No Limited to Skin Breakdown Periwound Skin Texture Texture Color No Abnormalities Noted: No No Abnormalities Noted: No Callus: No Atrophie Blanche: No Crepitus: No Cyanosis: No Excoriation: No Ecchymosis: No Fluctuance: No Erythema: Yes Friable: No Erythema Location: Circumferential Induration: No Hemosiderin Staining: No Localized Edema: Yes Mottled: No Rash: No Pallor: No Scarring: No Rubor: No Moisture Temperature / Pain No Abnormalities Noted: No Temperature: No Abnormality Dry / Scaly:  No Maceration: No Moist: No Wound Preparation Ulcer Cleansing: Rinsed/Irrigated with Saline Topical Anesthetic Applied: Other: lidocaine 4%, Treatment Notes Wound #19 (Right, Lateral Amputation Site - Below Knee) 1. Cleansed with: Clean wound with Normal Saline 2. Anesthetic Topical Lidocaine 4% cream to wound bed prior to debridement Notes triple antibiotic ointment and bandage Electronic Signature(s) Signed: 08/05/2016 5:51:11 PM By: [name] Entered By: [name] on 08/05/2016 16:13:06 [name] ([id number]) -------------------------------------------------------------------------------- Wound Assessment Details Patient Name: [name] Date of Service: 08/05/2016 3:45 PM Medical Record Number: [id number] Patient Account Number: [id number] Date of Birth/Sex: July 28, 1981 (35 y.o. Female) Treating RN: Ashok Cordia, Debi Primary Care Physician: [name] Other Clinician: Referring Physician: Rolin Wu Treating Physician/Extender: [name] in Treatment: 48 Wound Status Wound Number: 9 Primary Diabetic Wound/Ulcer of the Lower Etiology: Extremity Wound Location: Left Calcaneous Wound Open Wounding Event: Pressure Injury Status: Date Acquired: 07/28/2015 Comorbid Anemia, Type I Diabetes, End Stage Weeks Of Treatment: 48 History:  Renal Disease, Rheumatoid Arthritis, Clustered Wound: No Neuropathy Photos Photo Uploaded By: [name] on 08/05/2016 17:18:24 Wound Measurements Length: (cm) 2.7 % Reduction in Width: (cm) 2.5 % Reduction in Depth: (cm) 0.1 Epithelializati Area: (cm) 5.301 Tunneling: Volume: (cm) 0.53 Undermining: Area: 71.5% Volume: 71.5% on: None No No Wound Description Classification: Grade 2 Wound Margin: Flat and Intact Exudate Amount: Large Exudate Type: Serosanguineous Exudate Color: red, brown Foul Odor After Cleansing: No Wound Bed Granulation Amount: Medium (34-66%) Exposed  Structure Granulation Quality: Red Fascia Exposed: No Necrotic Amount: Medium (34-66%) Fat Layer Exposed: No Necrotic Quality: Eschar, Adherent Slough Tendon Exposed: No Croak, Priti N. ([id number]) Muscle Exposed: No Joint Exposed: No Bone Exposed: No Limited to Skin Breakdown Periwound Skin Texture Texture Color No Abnormalities Noted: No No Abnormalities Noted: No Callus: No Atrophie Blanche: No Crepitus: No Cyanosis: No Excoriation: No Ecchymosis: No Fluctuance: No Erythema: No Friable: No Hemosiderin Staining: No Induration: No Mottled: No Localized Edema: No Pallor: No Rash: No Rubor: No Scarring: No Moisture No Abnormalities Noted: No Dry / Scaly: No Maceration: No Moist: Yes Wound Preparation Ulcer Cleansing: Rinsed/Irrigated with Saline Topical Anesthetic Applied: Other: lidocaine 4%, Treatment Notes Wound #9 (Left Calcaneous) 1. Cleansed with: Clean wound with Normal Saline 2. Anesthetic Topical Lidocaine 4% cream to wound bed prior to debridement 4. Dressing Applied: Iodosorb Ointment 5. Secondary Dressing Applied Foam Kerlix/Conform Notes conform lightly to secure Electronic Signature(s) Signed: 08/05/2016 5:51:11 PM By: [name] Entered By: [name] on 08/05/2016 16:09:26 [name] ([id number]) -------------------------------------------------------------------------------- Vitals Details [name] Date of Service: 08/05/2016 3:45 PM Patient Name: N. Patient Account Number: [id number] Medical Record Treating RN: [name] [id number] Number: Other Clinician: 04-Oct-1980 (35 y.o. [name] Date of Birth/Sex: Female) Physician/Extender: G Primary Care [name] Physician: Referring Physician: [name] Wu in Treatment: 48 Vital Signs Time Taken: 15:51 Pulse (bpm): 70 Height (in): 68 Respiratory Rate (breaths/min): 16 Weight (lbs): 96 Blood Pressure (mmHg):  103/56 Body Mass Index (BMI): 14.6 Reference Range: 80 - 120 mg / dl Electronic Signature(s) Signed: 08/05/2016 5:51:11 PM By: [name] Entered By: [name] on 08/05/2016 15:53:46

## 2016-08-11 ENCOUNTER — Ambulatory Visit: Payer: Medicare Other | Admitting: Physical Therapy

## 2016-08-12 ENCOUNTER — Ambulatory Visit: Payer: Medicare Other | Admitting: Internal Medicine

## 2016-08-19 ENCOUNTER — Ambulatory Visit: Payer: Medicare Other | Admitting: Nurse Practitioner

## 2016-08-26 ENCOUNTER — Ambulatory Visit: Payer: Medicare Other | Admitting: Internal Medicine

## 2016-08-27 ENCOUNTER — Ambulatory Visit: Payer: Medicare Other | Admitting: Physical Therapy

## 2016-08-27 ENCOUNTER — Ambulatory Visit: Payer: Medicare Other | Admitting: Nurse Practitioner

## 2016-09-02 ENCOUNTER — Ambulatory Visit: Payer: Medicare Other | Admitting: Internal Medicine

## 2016-09-08 ENCOUNTER — Other Ambulatory Visit
Admission: RE | Admit: 2016-09-08 | Discharge: 2016-09-08 | Disposition: A | Discharge status: Home / Self Care | Payer: Medicare Other | Source: Ambulatory Visit | Attending: Family Medicine | Admitting: Family Medicine

## 2016-09-08 ENCOUNTER — Encounter: Payer: Medicare Other | Attending: Internal Medicine | Admitting: Internal Medicine

## 2016-09-08 DIAGNOSIS — T87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, right lower extremity: Secondary | ICD-10-CM | POA: Diagnosis not present

## 2016-09-08 DIAGNOSIS — E10621 Type 1 diabetes mellitus with foot ulcer: Secondary | ICD-10-CM | POA: Diagnosis present

## 2016-09-08 DIAGNOSIS — E1052 Type 1 diabetes mellitus with diabetic peripheral angiopathy with gangrene: Secondary | ICD-10-CM | POA: Insufficient documentation

## 2016-09-08 DIAGNOSIS — S71101A Unspecified open wound, right thigh, initial encounter: Secondary | ICD-10-CM | POA: Insufficient documentation

## 2016-09-08 DIAGNOSIS — L89223 Pressure ulcer of left hip, stage 3: Secondary | ICD-10-CM | POA: Diagnosis not present

## 2016-09-08 DIAGNOSIS — I70261 Atherosclerosis of native arteries of extremities with gangrene, right leg: Secondary | ICD-10-CM | POA: Diagnosis not present

## 2016-09-08 DIAGNOSIS — Z89511 Acquired absence of right leg below knee: Secondary | ICD-10-CM | POA: Insufficient documentation

## 2016-09-08 DIAGNOSIS — N186 End stage renal disease: Secondary | ICD-10-CM | POA: Insufficient documentation

## 2016-09-08 DIAGNOSIS — L89899 Pressure ulcer of other site, unspecified stage: Secondary | ICD-10-CM | POA: Diagnosis not present

## 2016-09-08 DIAGNOSIS — I70245 Atherosclerosis of native arteries of left leg with ulceration of other part of foot: Secondary | ICD-10-CM | POA: Insufficient documentation

## 2016-09-08 DIAGNOSIS — X58XXXA Exposure to other specified factors, initial encounter: Secondary | ICD-10-CM | POA: Diagnosis not present

## 2016-09-08 DIAGNOSIS — Z992 Dependence on renal dialysis: Secondary | ICD-10-CM | POA: Diagnosis not present

## 2016-09-08 DIAGNOSIS — L89322 Pressure ulcer of left buttock, stage 2: Secondary | ICD-10-CM | POA: Diagnosis not present

## 2016-09-08 DIAGNOSIS — L89622 Pressure ulcer of left heel, stage 2: Secondary | ICD-10-CM | POA: Insufficient documentation

## 2016-09-09 ENCOUNTER — Ambulatory Visit: Payer: Medicare Other | Admitting: Internal Medicine

## 2016-09-09 ENCOUNTER — Other Ambulatory Visit
Admission: RE | Admit: 2016-09-09 | Discharge: 2016-09-09 | Disposition: A | Discharge status: Home / Self Care | Payer: Medicare Other | Source: Ambulatory Visit | Attending: Family Medicine | Admitting: Family Medicine

## 2016-09-09 DIAGNOSIS — B999 Unspecified infectious disease: Secondary | ICD-10-CM | POA: Diagnosis present

## 2016-09-09 NOTE — Progress Notes (Signed)
[name], [name] ([id number]) Visit Report for 09/08/2016 Arrival Information Details [name], [name] Date of Service: 09/08/2016 3:00 PM Patient Name: N. Patient Account Number: [id number] Medical Record Treating RN: [name] [id number] Number: Other Clinician: 08-29-1981 (35 y.o. Treating [name], [name] Date of Birth/Sex: Female) Physician/Extender: G Primary Care [name] Physician: Referring Physician: [name] in Treatment: 28 Visit Information History Since Last Visit Added or deleted any medications: No Patient Arrived: Wheel Chair Any new allergies or adverse reactions: No Arrival Time: 15:08 Had a fall or experienced change in No activities of daily living that may affect Accompanied By: mother risk of falls: Transfer Assistance: Manual Signs or symptoms of abuse/neglect since last No Patient Identification Verified: Yes visito Secondary Verification Process Yes Hospitalized since last visit: No Completed: Pain Present Now: No Patient Requires Transmission-Based No Precautions: Patient Has Alerts: Yes Electronic Signature(s) Signed: 09/08/2016 6:11:40 PM By: [name] Entered By: [name] on 09/08/2016 15:14:33 [name] ([id number]) -------------------------------------------------------------------------------- Encounter Discharge Information Details [name] Date of Service: 09/08/2016 3:00 PM Patient Name: N. Patient Account Number: [id number] Medical Record Treating RN: [name] [id number] Number: Other Clinician: 09/29/1980 (35 y.o. Treating [name], [name] Date of Birth/Sex: Female) Physician/Extender: G Primary Care [name] Physician: Referring Physician: [name] in Treatment: 24 Encounter Discharge Information Items Discharge Pain Level: 0 Discharge Condition: Stable Ambulatory Status: Wheelchair Discharge Destination: Home Transportation: Private  Auto Accompanied By: mother Schedule Follow-up Appointment: Yes Medication Reconciliation completed and provided to Patient/Care No [name]: Provided on Clinical Summary of Care: 09/08/2016 Form Type Recipient Paper Patient HB Electronic Signature(s) Signed: 09/08/2016 6:11:40 PM By: [name] Previous Signature: 09/08/2016 4:42:55 PM Version By: Gwenlyn Perking Entered By: [name] on 09/08/2016 16:57:57 [name] ([id number]) -------------------------------------------------------------------------------- Multi Wound Chart Details [name] Date of Service: 09/08/2016 3:00 PM Patient Name: N. Patient Account Number: [id number] Medical Record Treating RN: [name] [id number] Number: Other Clinician: July 25, 1981 (35 y.o. 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] [17:Left Amputation Site - Left Ischial Tuberosity Transmetatarsal] Wounding Event: [16:Pressure Injury] [17:Surgical Injury] [18:Pressure Injury] Primary Etiology: [16:Pressure Ulcer] [17:Open Surgical Wound] [18:Pressure Ulcer] Comorbid History: [16:Anemia, Type I Diabetes, Anemia, Type I Diabetes, Anemia, Type I Diabetes, End Stage Renal Disease, End Stage Renal Disease, End Stage Renal Disease, Rheumatoid Arthritis, Neuropathy] [17:Rheumatoid Arthritis, Rheumatoid Arthritis,  Neuropathy] [18:Neuropathy] Date Acquired: [16:04/02/2016] [17:04/13/2016] [18:05/12/2016] Weeks of Treatment: [16:22] [17:19] [18:13] Wound Status: [16:Open] [17:Open] [18:Open] Measurements L x  W x D 4.3x3.4x0.7 [17:0.9x0.9x0.4] [18:2.5x2.4x0.4] (cm) Area (cm) : [16:11.483] [17:0.636] [18:4.712] Volume (cm) : [16:8.038] [17:0.254] [18:1.885] % Reduction in Area: [16:-93.40%] [17:24.30%] [18:64.90%] % Reduction in Volume: -1253.20% [17:-202.40%] [18:-40.40%] Classification: [16:Category/Stage III] [17:Partial Thickness] [18:Category/Stage III] HBO Classification: [16:Grade 1] [17:Grade 1] [18:N/A] Exudate Amount: [16:Large] [17:Large] [18:Large] Exudate Type: [16:Purulent] [17:Serous] [18:Serosanguineous] Exudate Color: [16:yellow, brown, green] [17:amber] [18:red, brown] Wound Margin: [16:Distinct, outline attached Flat and Intact] [18:Flat and Intact] Granulation Amount: [16:Medium (34-66%)] [17:Medium (34-66%)] [18:Large (67-100%)] Granulation Quality: [16:Red, Pink] [17:Red] [18:Pink] Necrotic Amount: Medium (34-66%) Medium (34-66%) Small (1-33%) Necrotic Tissue: Adherent Slough N/A Adherent Slough Exposed Structures: Fat: Yes Fascia: No Fascia: No Fat: No Fat: No Tendon: No Tendon: No Muscle: No Muscle: No  Joint: No Joint: No Bone: No Bone: No Limited to Skin Limited to Skin Breakdown Breakdown Epithelialization: None None Small (1-33%) Periwound Skin Texture: Edema: Yes Edema: No Edema: No Excoriation: No Excoriation: No Excoriation: No Induration: No Induration: No Induration: No Callus: No Callus: No Callus: No Crepitus: No Crepitus: No Crepitus: No Fluctuance: No Fluctuance: No Fluctuance: No Friable: No Friable: No Friable: No Rash: No Rash: No Rash: No Scarring: No Scarring: No Scarring: No Periwound Skin Moist: Yes Moist: Yes Moist: Yes Moisture: Maceration: No Maceration: No Maceration: No Dry/Scaly: No Dry/Scaly: No Dry/Scaly: No Periwound Skin Color: Erythema: Yes Atrophie Blanche: No Erythema: Yes Atrophie Blanche: No Cyanosis: No Atrophie Blanche: No Cyanosis: No Ecchymosis: No Cyanosis: No Ecchymosis:  No Erythema: No Ecchymosis: No Hemosiderin Staining: No Hemosiderin Staining: No Hemosiderin Staining: No Mottled: No Mottled: No Mottled: No Pallor: No Pallor: No Pallor: No Rubor: No Rubor: No Rubor: No Erythema Location: Circumferential N/A Circumferential Erythema Change: No Change N/A N/A Temperature: No Abnormality N/A N/A Tenderness on Yes No No Palpation: Wound Preparation: Ulcer Cleansing: Ulcer Cleansing: Ulcer Cleansing: Rinsed/Irrigated with Rinsed/Irrigated with Rinsed/Irrigated with Saline Saline Saline Topical Anesthetic Topical Anesthetic Topical Anesthetic Applied: None, Other: Applied: Other: lidocaine Applied: Other: lidocaine lidocaine 4% 4% 4% Wound Number: [id number]  Photos: No Photos No Photos No Photos Wound Location: Right, Lateral Amputation Left, Dorsal Foot Left Foot - Dorsal Site - Below Knee Wounding Event: Gradually Appeared Pressure Injury Pressure Injury Primary Etiology: Pressure Ulcer Pressure Ulcer Aleshire, Oluwatomisin N. ([id number]) Diabetic Wound/Ulcer of the Lower Extremity Comorbid History: N/A N/A Anemia, Type I Diabetes, End Stage Renal Disease, Rheumatoid Arthritis, Neuropathy Date Acquired: 07/22/2016 08/06/2016 08/06/2016 Weeks of Treatment: 6 0 0 Wound Status: Open Open Open Measurements L x W x D 0x0x0 0.9x2.4x0.1 3.1x2x0.1 (cm) Area (cm) : 0 1.696 4.869 Volume (cm) : 0 0.17 0.487 % Reduction in Area: 100.00% N/A 0.00% % Reduction in Volume: 100.00% N/A 0.00% Classification: Grade 2 N/A Unstageable/Unclassified HBO Classification: N/A N/A Grade 1 Exudate Amount: N/A N/A None Present Exudate Type: N/A N/A N/A Exudate Color: N/A N/A N/A Wound Margin: N/A N/A Flat and Intact Granulation Amount: N/A N/A None Present (0%) Granulation Quality: N/A N/A N/A Necrotic Amount: N/A N/A Large (67-100%) Necrotic Tissue: N/A N/A Eschar Exposed Structures: N/A N/A Fascia: No Fat: No Tendon: No Muscle: No Joint: No Bone:  No Limited to Skin Breakdown Epithelialization: N/A N/A None Periwound Skin Texture: No Abnormalities Noted No Abnormalities Noted Edema: No Excoriation: No Induration: No Callus: No Crepitus: No Fluctuance: No Friable: No Rash: No Scarring: No Periwound Skin No Abnormalities Noted No Abnormalities Noted Maceration: No Moisture: Moist: No Dry/Scaly: No Periwound Skin Color: No Abnormalities Noted No Abnormalities Noted Atrophie Blanche: No Cyanosis: No Ecchymosis: No Erythema: No Mcclenathan, Fatema N. ([id number]) Hemosiderin Staining: No Mottled: No Pallor: No Rubor: No Erythema Location: N/A N/A N/A Erythema Change: N/A N/A N/A Temperature: N/A N/A N/A Tenderness on No No No Palpation: Wound Preparation: N/A N/A Ulcer Cleansing: Rinsed/Irrigated with Saline Topical Anesthetic Applied: None Wound Number: [id number]  Photos: No Photos No Photos No Photos Wound Location: Left Foot - Lateral Right Ischial Tuberosity Left Calcaneous Wounding Event: Pressure Injury Pressure Injury Pressure Injury Primary Etiology: Pressure Ulcer Pressure Ulcer Diabetic Wound/Ulcer of the Lower Extremity Comorbid History: Anemia, Type I Diabetes, Anemia, Type I Diabetes, Anemia, Type I Diabetes, End Stage Renal Disease, End Stage Renal Disease, End Stage Renal Disease, Rheumatoid Arthritis, Rheumatoid Arthritis, Rheumatoid Arthritis, Neuropathy Neuropathy Neuropathy  Date Acquired: 08/06/2016 09/07/2016 07/28/2015 Weeks of Treatment: 0 0 53 Wound Status: Open Open Open Measurements L x W x D 2.5x0.8x0.1 0.8x2.4x0.1 2.7x2.5x0.1 (cm) Area (cm) : 1.571 1.508 5.301 Volume (cm) : 0.157 0.151 0.53 % Reduction in Area: 0.00% 0.00% 71.50% % Reduction in Volume: 0.00% 0.00% 71.50% Classification: Unstageable/Unclassified Category/Stage II Grade 2 HBO Classification: Grade 1 N/A N/A Exudate Amount: None Present Medium Large Exudate Type: N/A Serous Serosanguineous Exudate Color: N/A amber  red, brown Wound Margin: Flat and Intact Flat and Intact Flat and Intact Granulation Amount: None Present (0%) Medium (34-66%) Medium (34-66%) Granulation Quality: N/A Pink Red Necrotic Amount: Large (67-100%) Medium (34-66%) Medium (34-66%) Necrotic Tissue: Eschar Adherent Slough Eschar, Adherent Slough Exposed Structures: Fascia: No Fascia: No Fascia: No Fat: No Fat: No Fat: No Tendon: No Tendon: No Tendon: No Muscle: No Muscle: No Muscle: No Wilcher, Shaylie N. ([id number]) Joint: No Joint: No Joint: No Bone: No Bone: No Bone: No Limited to Skin Limited to Skin Limited to Skin Breakdown Breakdown Breakdown Epithelialization: None None None Periwound Skin Texture: Edema: No Edema: No Edema: No Excoriation: No Excoriation: No Excoriation: No Induration: No Induration: No Induration: No Callus: No Callus: No Callus: No Crepitus: No Crepitus: No Crepitus: No Fluctuance: No Fluctuance: No Fluctuance: No Friable: No Friable: No Friable: No Rash: No Rash: No Rash: No Scarring: No Scarring: No Scarring: No Periwound Skin Maceration: No Maceration: No Moist: Yes Moisture: Moist: No Moist: No Maceration: No Dry/Scaly: No Dry/Scaly: No Dry/Scaly: No Periwound Skin Color: Atrophie Blanche: No Atrophie Blanche: No Atrophie Blanche: No Cyanosis: No Cyanosis: No Cyanosis: No Ecchymosis: No Ecchymosis: No Ecchymosis: No Erythema: No Erythema: No Erythema: No Hemosiderin Staining: No Hemosiderin Staining: No Hemosiderin Staining: No Mottled: No Mottled: No Mottled: No Pallor: No Pallor: No Pallor: No Rubor: No Rubor: No Rubor: No Erythema Location: N/A N/A N/A Erythema Change: N/A N/A N/A Temperature: N/A N/A N/A Tenderness on No No No Palpation: Wound Preparation: Ulcer Cleansing: Ulcer Cleansing: Ulcer Cleansing: Rinsed/Irrigated with Rinsed/Irrigated with Rinsed/Irrigated with Saline Saline Saline Topical Anesthetic Topical  Anesthetic Topical Anesthetic Applied: None Applied: None Applied: Other: lidocaine 4% Treatment Notes Electronic Signature(s) Signed: 09/08/2016 6:11:40 PM By: [name] Entered By: [name] on 09/08/2016 16:51:15 [name] ([id number]) -------------------------------------------------------------------------------- Multi-Disciplinary Care Plan Details [name] Date of Service: 09/08/2016 3:00 PM Patient Name: N. Patient Account Number: [id number] Medical Record Treating RN: [name] [id number] Number: Other Clinician: Sep 30, 1980 (35 y.o. Treating [name], [name] Date of Birth/Sex: Female) Physician/Extender: G Primary Care [name] Physician: Referring Physician: [name] in Treatment: 41 Active Inactive HBO Nursing Diagnoses: Anxiety related to feelings of confinement associated with the hyperbaric oxygen chamber Anxiety related to knowledge deficit of hyperbaric oxygen therapy and treatment procedures Discomfort related to temperature and humidity changes inside hyperbaric chamber Potential for barotraumas to ears, sinuses, teeth, and lungs or cerebral gas embolism related to changes in atmospheric pressure inside hyperbaric oxygen chamber Potential for oxygen toxicity seizures related to delivery of 100% oxygen at an increased atmospheric pressure Potential for pulmonary oxygen toxicity related to delivery of 100% oxygen at an increased atmospheric pressure Goals: Barotrauma will be prevented during HBO2 Date Initiated: 08/30/2015 Goal Status: Active Patient and/or family will be able to state/discuss factors appropriate to the management of their disease process during treatment Date Initiated: 08/30/2015 Goal Status: Active Patient will tolerate the hyperbaric oxygen therapy treatment Date Initiated: 08/30/2015 Goal Status: Active Patient will tolerate the internal climate of the chamber  Date Initiated:  08/30/2015 Goal Status: Active Patient/caregiver will verbalize understanding of HBO goals, rationale, procedures and potential hazards Date Initiated: 08/30/2015 Goal Status: Active Signs and symptoms of pulmonary oxygen toxicity will be recognized and promptly addressed Date Initiated: 08/30/2015 [name] ([id number]) Goal Status: Active Signs and symptoms of seizure will be recognized and promptly addressed ; seizing patients will suffer no harm Date Initiated: 08/30/2015 Goal Status: Active Interventions: Administer a five (5) minute air break for patient if signs and symptoms of seizure appear and notify the hyperbaric physician Administer a ten (10) minute air break for patient if signs and symptoms of seizure appear and notify the hyperbaric physician Administer decongestants, per physician orders, prior to HBO2 Administer the correct therapeutic gas delivery based on the patients needs and limitations, per physician order Assess and provide for patientos comfort related to the hyperbaric environment and equalization of middle ear Assess for signs and symptoms related to adverse events, including but not limited to confinement anxiety, pneumothorax, oxygen toxicity and baurotrauma Assess patient for any history of confinement anxiety Assess patient's knowledge and expectations regarding hyperbaric medicine and provide education related to the hyperbaric environment, goals of treatment and prevention of adverse events Implement protocols to decrease risk of pneumothorax in high risk patients Notes: Abuse / Safety / Falls / Self Care Management Nursing Diagnoses: Potential for falls Goals: Patient will remain injury free Date Initiated: 09/19/2015 Goal Status: Active Patient/caregiver will verbalize/demonstrate measures taken to prevent injury and/or falls Date Initiated: 09/19/2015 Goal Status: Active Interventions: Assess fall risk on admission and as  needed Assess impairment of mobility on admission and as needed per policy Notes: Orientation to the Wound Care Program Nursing Diagnoses: SAKEENA, TEALL ([id number]) Knowledge deficit related to the wound healing center program Goals: Patient/caregiver will verbalize understanding of the Wound Healing Center Program Date Initiated: 08/30/2015 Goal Status: Active Interventions: Provide education on orientation to the wound center Notes: Pressure Nursing Diagnoses: Knowledge deficit related to causes and risk factors for pressure ulcer development Knowledge deficit related to management of pressures ulcers Potential for impaired tissue integrity related to pressure, friction, moisture, and shear Goals: Patient will remain free from development of additional pressure ulcers Date Initiated: 08/30/2015 Goal Status: Active Patient will remain free of pressure ulcers Date Initiated: 08/30/2015 Goal Status: Active Patient/caregiver will verbalize risk factors for pressure ulcer development Date Initiated: 08/30/2015 Goal Status: Active Patient/caregiver will verbalize understanding of pressure ulcer management Date Initiated: 08/30/2015 Goal Status: Active Interventions: Assess: immobility, friction, shearing, incontinence upon admission and as needed Assess offloading mechanisms upon admission and as needed Assess potential for pressure ulcer upon admission and as needed Provide education on pressure ulcers Treatment Activities: Patient referred for home evaluation of offloading devices/mattresses : 01/15/2016 Patient referred for pressure reduction/relief devices : 01/15/2016 Pressure reduction/relief device ordered : 01/15/2016 Notes: HEAVIN, SEBREE ([id number]) Wound/Skin Impairment Nursing Diagnoses: Impaired tissue integrity Knowledge deficit related to ulceration/compromised skin integrity Goals: Patient will have a decrease in wound volume by X% from date:  (specify in notes) Date Initiated: 08/30/2015 Goal Status: Active Patient/caregiver will verbalize understanding of skin care regimen Date Initiated: 08/30/2015 Goal Status: Active Ulcer/skin breakdown will have a volume reduction of 30% by week 4 Date Initiated: 08/30/2015 Goal Status: Active Ulcer/skin breakdown will have a volume reduction of 50% by week 8 Date Initiated: 08/30/2015 Goal Status: Active Ulcer/skin breakdown will have a volume reduction of 80% by week 12 Date Initiated: 08/30/2015 Goal Status: Active Ulcer/skin breakdown will heal  within 14 weeks Date Initiated: 08/30/2015 Goal Status: Active Interventions: Assess patient/caregiver ability to obtain necessary supplies Assess patient/caregiver ability to perform ulcer/skin care regimen upon admission and as needed Assess ulceration(s) every visit Provide education on ulcer and skin care Notes: Electronic Signature(s) Signed: 09/08/2016 6:11:40 PM By: [name] Entered By: [name] on 09/08/2016 16:51:08 [name] ([id number]) -------------------------------------------------------------------------------- Pain Assessment Details [name] Date of Service: 09/08/2016 3:00 PM Patient Name: N. Patient Account Number: [id number] Medical Record Treating RN: [name] [id number] Number: Other Clinician: 10/12/1980 (35 y.o. Treating [name], [name] Date of Birth/Sex: Female) Physician/Extender: G Primary Care [name] Physician: Referring Physician: [name] in Treatment: 57 Active Problems Location of Pain Severity and Description of Pain Patient Has Paino No Site Locations Pain Management and Medication Current Pain Management: Electronic Signature(s) Signed: 09/08/2016 6:11:40 PM By: [name] Entered By: [name] on 09/08/2016 15:14:42 [name]  ([id number]) -------------------------------------------------------------------------------- Patient/Caregiver Education Details [name] Date of Service: 09/08/2016 3:00 PM Patient Name: N. Patient Account Number: [id number] Medical Record Treating RN: [name] [id number] Number: Other Clinician: 09-06-1981 (35 y.o. Treating [name], [name] Date of Birth/Gender: Female) Physician/Extender: G Primary Care Weeks in Treatment: 84 [name] Physician: Referring Physician: [name] Education Assessment Education Provided To: Patient and Caregiver Education Topics Provided Wound/Skin Impairment: Handouts: Other: wound care as ordered Methods: Demonstration, Explain/Verbal Responses: State content correctly Electronic Signature(s) Signed: 09/08/2016 6:11:40 PM By: [name] Entered By: [name] on 09/08/2016 16:58:29 [name] ([id number]) -------------------------------------------------------------------------------- Wound Assessment Details [name] Date of Service: 09/08/2016 3:00 PM Patient Name: N. Patient Account Number: [id number] Medical Record Treating RN: [name] [id number] Number: Other Clinician: [birth year] (35 y.o. 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 Wound Open Status: Wounding Event: Pressure Injury Comorbid Anemia, Type I Diabetes, End Stage Date Acquired: 04/02/2016 History: Renal Disease, Rheumatoid Arthritis, Weeks Of Treatment: 22 Neuropathy Clustered Wound: No Photos Photo Uploaded By: Elliot Gurney, RN, [name], Kim on 09/09/2016 11:44:44 Wound Measurements Length: (cm) 4.3 Width: (cm) 3.4 Depth: (cm) 0.7 Area: (cm) 11.483 Volume:  (cm) 8.038 % Reduction in Area: -93.4% % Reduction in Volume: -1253.2% Epithelialization: None Tunneling: No Undermining: No Wound Description Classification: Category/Stage III Diabetic Severity (Wagner): Grade 1 Wound Margin: Distinct, outline attach Exudate Amount: Large Exudate Type: Purulent Exudate Color: yellow, brown, green Duffin, Tammy N. ([id number]) Foul Odor After Cleansing: No ed Wound Bed Granulation Amount: Medium (34-66%) Exposed Structure Granulation Quality: Red, Pink Fat Layer Exposed: Yes Necrotic Amount: Medium (34-66%) Necrotic Quality: Adherent Slough Periwound Skin Texture Texture Color No Abnormalities Noted: No No Abnormalities Noted: No Callus: No Atrophie Blanche: No Crepitus: No Cyanosis: No Excoriation: No Ecchymosis: No Fluctuance: No Erythema: Yes Friable: No Erythema Location: Circumferential Induration: No Erythema Change: No Change Localized Edema: Yes Hemosiderin Staining: No Rash: No Mottled: No Scarring: No Pallor: No Rubor: No Moisture No Abnormalities Noted: No Temperature / Pain Dry / Scaly: No Temperature: No Abnormality Maceration: No Tenderness on Palpation: Yes Moist: Yes Wound Preparation Ulcer Cleansing: Rinsed/Irrigated with Saline Topical Anesthetic Applied: None, Other: lidocaine 4%, Treatment Notes 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 Cleansed with: Clean wound with Normal Saline 2. Anesthetic Topical Lidocaine 4% cream to wound bed prior to debridement 4. Dressing Applied: Prisma Ag 5. Secondary  Dressing Applied ABD Pad Bordered Foam Dressing Kerlix/Conform Electronic Signature(s) Signed: 09/08/2016 6:11:40 PM By: [name] Entered By: [name] on 09/08/2016 16:47:58 [name] ([id number]) -------------------------------------------------------------------------------- Wound Assessment Details [name] Date of Service: 09/08/2016  3:00 PM Patient Name: N. Patient Account Number: [id number] Medical Record Treating RN: [name] [id number] Number: Other Clinician: [birth year] (35 y.o. Treating [name], [name] Date of Birth/Sex: Female) Physician/Extender: G Primary Care [name] Physician: Referring Physician: [name] in Treatment: 73 Wound Status Wound Number: 17 Primary Open Surgical Wound Etiology: Wound Location: Left Amputation Site - Transmetatarsal Wound Open Status: Wounding Event: Surgical Injury Comorbid Anemia, Type I Diabetes, End Stage Date Acquired: 04/13/2016 History: Renal Disease, Rheumatoid Arthritis, Weeks Of Treatment: 19 Neuropathy Clustered Wound: No Photos Photo Uploaded By: Elliot Gurney, RN, [name], Kim on 09/09/2016 11:44:45 Wound Measurements Length: (cm) 0.9 Width: (cm) 0.9 Depth: (cm) 0.4 Area: (cm) 0.636 Volume: (cm) 0.254 % Reduction in Area: 24.3% % Reduction in Volume: -202.4% Epithelialization: None Tunneling: No Undermining: No Wound Description Classification: Partial Thickness Foul Odor Aft Diabetic Severity [address]): Grade 1 Wound Margin: Flat and Intact Exudate Amount: Large Exudate Type: Serous Exudate Color: amber MELYSSA, SIGNOR ([id number]) er Cleansing: No Wound Bed Granulation Amount: Medium (34-66%) Exposed Structure Granulation Quality: Red Fascia Exposed: No Necrotic Amount: Medium (34-66%) Fat Layer Exposed: No Tendon Exposed: No Muscle Exposed: No Joint Exposed: No Bone Exposed: No Limited to Skin Breakdown Periwound Skin Texture Texture Color No Abnormalities Noted: No No Abnormalities Noted: No Callus: No Atrophie Blanche: No Crepitus: No Cyanosis: No Excoriation: No Ecchymosis: No Fluctuance: No Erythema: No Friable: No Hemosiderin Staining: No Induration: No Mottled: No Localized Edema: No Pallor: No Rash: No Rubor: No Scarring: No Moisture No Abnormalities Noted: No Dry / Scaly:  No Maceration: No Moist: Yes Wound Preparation Ulcer Cleansing: Rinsed/Irrigated with Saline Topical Anesthetic Applied: Other: lidocaine 4%, Treatment Notes 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 Cleansed with: Clean wound with Normal Saline 2. Anesthetic Topical Lidocaine 4% cream to wound bed prior to debridement 4. Dressing Applied: Prisma Ag 5. Secondary Dressing Applied ABD Pad Bordered Foam Dressing Kerlix/Conform Electronic Signature(s) Signed: 09/08/2016 6:11:40 PM By: Donella Stade, Ledell Peoples ([id number]) Entered By: [name] on 09/08/2016 16:48:20 [name] ([id number]) -------------------------------------------------------------------------------- Wound Assessment Details [name] Date of Service: 09/08/2016 3:00 PM Patient Name: N. Patient Account Number: [id number] Medical Record Treating RN: [name] [id number] Number: Other Clinician: October 01, 1980 (35 y.o. Treating [name], [name] Date of Birth/Sex: Female) Physician/Extender: G Primary Care [name] Physician: Referring Physician: [name] in Treatment: 42 Wound Status Wound Number: 18 Primary Pressure Ulcer Etiology: Wound Location: Left Ischial Tuberosity Wound Open Wounding Event: Pressure Injury Status: Date Acquired: 05/12/2016 Comorbid Anemia, Type I Diabetes, End Stage Weeks Of Treatment: 13 History: Renal Disease, Rheumatoid Arthritis, Clustered Wound: No Neuropathy Photos Photo Uploaded By: Elliot Gurney, RN, [name], Kim on 09/09/2016 11:45:55 Wound Measurements Length: (cm) 2.5 % Reduction in Ar Width: (cm) 2.4 % Reduction in Vo Depth: (cm) 0.4 Epithelialization Area: (cm) 4.712 Tunneling: Volume: (cm) 1.885 Undermining: ea: 64.9% lume: -40.4% : Small (1-33%) No No Wound Description Classification: Category/Stage III Wound Margin: Flat and Intact Exudate Amount: Large Exudate Type:  Serosanguineous Exudate Color: red, brown Wound Bed Myricks, Rafaella N. ([id number]) Granulation Amount: Large (67-100%) Exposed Structure Granulation Quality: Pink Fascia Exposed: No Necrotic Amount: Small (1-33%) Fat Layer Exposed: No Necrotic Quality: Adherent Slough Tendon Exposed: No Muscle Exposed: No Joint Exposed: No Bone Exposed: No  Limited to Skin Breakdown Periwound Skin Texture Texture Color No Abnormalities Noted: No No Abnormalities Noted: No Callus: No Atrophie Blanche: No Crepitus: No Cyanosis: No Excoriation: No Ecchymosis: No Fluctuance: No Erythema: Yes Friable: No Erythema Location: Circumferential Induration: No Hemosiderin Staining: No Localized Edema: No Mottled: No Rash: No Pallor: No Scarring: No Rubor: No Moisture No Abnormalities Noted: No Dry / Scaly: No Maceration: No Moist: Yes Wound Preparation Ulcer Cleansing: Rinsed/Irrigated with Saline Topical Anesthetic Applied: Other: lidocaine 4%, Treatment Notes Wound #18 (Left Ischial Tuberosity) 1. Cleansed with: Clean wound with Normal Saline 2. Anesthetic Topical Lidocaine 4% cream to wound bed prior to debridement 4. Dressing Applied: Prisma Ag 5. Secondary Dressing Applied ABD Pad Bordered Foam Dressing Kerlix/Conform Electronic Signature(s) Signed: 09/08/2016 6:11:40 PM By: Donella Stade, Ledell Peoples ([id number]) Entered By: [name] on 09/08/2016 16:48:40 [name] ([id number]) -------------------------------------------------------------------------------- Wound Assessment Details [name] Date of Service: 09/08/2016 3:00 PM Patient Name: N. Patient Account Number: [id number] Medical Record Treating RN: [name] [id number] Number: Other Clinician: 11-Feb-1981 (35 y.o. 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, Lateral Amputation Site - Below Knee Wound Status: Open Wounding Event: Gradually Appeared Date Acquired: 07/22/2016 Weeks Of Treatment: 6 Clustered Wound: No Wound Measurements Length: (cm) 0 Width: (cm) 0 Depth: (cm) 0 Area: (cm) 0 Volume: (cm) 0 % Reduction in Area: 100% % Reduction in Volume: 100% Wound Description Classification: Grade 2 Periwound Skin Texture Texture Color No Abnormalities Noted: No No Abnormalities Noted: No Moisture No Abnormalities Noted: No Electronic Signature(s) Signed: 09/08/2016 6:11:40 PM By: [name] Entered By: [name] on 09/08/2016 16:32:18 [name] ([id number]) -------------------------------------------------------------------------------- Wound Assessment Details [name] Date of Service: 09/08/2016 3:00 PM Patient Name: N. Patient Account Number: [id number] Medical Record Treating RN: [name] [id number] Number: Other Clinician: 1981/03/04 (35 y.o. Treating [name], [name] Date of Birth/Sex: Female) Physician/Extender: G Primary Care [name] Physician: Referring Physician: [name] in Treatment: 48 Wound Status Wound Number: 20 Primary Etiology: Pressure Ulcer Wound Location: Left, Dorsal Foot Wound Status: Open Wounding Event: Pressure Injury Date Acquired: 08/06/2016 Weeks Of Treatment: 0 Clustered Wound: No Wound Measurements Length: (cm) 0.9 % Reduction in Width: (cm) 2.4 % Reduction in Depth: (cm) 0.1 Area: (cm) 1.696 Volume: (cm) 0.17 Area: Volume: Periwound Skin Texture Texture Color No Abnormalities Noted: No No Abnormalities Noted: No Moisture No Abnormalities Noted: No Treatment Notes Wound #20 (Left, Dorsal Foot) 1. Cleansed with: Clean wound with Normal Saline 5. Secondary Dressing Applied Bordered  Foam Dressing Electronic Signature(s) Signed: 09/08/2016 6:11:40 PM By: [name] Entered By: [name] on 09/08/2016 16:33:31 [name] ([id number]) -------------------------------------------------------------------------------- Wound Assessment Details [name] Date of Service: 09/08/2016 3:00 PM Patient Name: N. Patient Account Number: [id number] Medical Record Treating RN: [name] [id number] Number: Other Clinician: [birth year] (35 y.o. Treating [name], [name] Date of Birth/Sex: Female) Physician/Extender: G Primary Care [name] Physician: Referring Physician: [name] in Treatment: 3 Wound Status Wound Number: 21 Primary Pressure Ulcer Etiology: Wound Location: Left Foot - Lateral Wound Open Wounding Event: Pressure Injury Status: Date Acquired: 08/06/2016 Comorbid Anemia, Type I Diabetes, End Stage Weeks Of Treatment: 0 History: Renal Disease, Rheumatoid Arthritis, Clustered Wound: No Neuropathy Photos Photo Uploaded By: Elliot Gurney, RN, [name], Kim on 09/09/2016 11:45:55 Wound Measurements Length: (cm) 2.5 Width: (  cm) 0.8 Depth: (cm) 0.1 Area: (cm) 1.571 Volume: (cm) 0.157 % Reduction in Area: 0% % Reduction in Volume: 0% Epithelialization: None Tunneling: No Undermining: No Wound Description Classification: Unstageable/Unclassified Foul Od Diabetic Severity (Wagner): Grade 1 Wound Margin: Flat and Intact Exudate Amount: None Present or After Cleansing: No Wound Bed Granulation Amount: None Present (0%) Exposed Structure Tawney, Raelan N. ([id number]) Necrotic Amount: Large (67-100%) Fascia Exposed: No Necrotic Quality: Eschar Fat Layer Exposed: No Tendon Exposed: No Muscle Exposed: No Joint Exposed: No Bone Exposed: No Limited to Skin Breakdown Periwound Skin Texture Texture Color No Abnormalities Noted: No No Abnormalities Noted: No Callus: No Atrophie Blanche: No Crepitus:  No Cyanosis: No Excoriation: No Ecchymosis: No Fluctuance: No Erythema: No Friable: No Hemosiderin Staining: No Induration: No Mottled: No Localized Edema: No Pallor: No Rash: No Rubor: No Scarring: No Moisture No Abnormalities Noted: No Dry / Scaly: No Maceration: No Moist: No Wound Preparation Ulcer Cleansing: Rinsed/Irrigated with Saline Topical Anesthetic Applied: None Treatment Notes Wound #21 (Left, Lateral Foot) 1. Cleansed with: Clean wound with Normal Saline 5. Secondary Dressing Applied Bordered Foam Dressing Electronic Signature(s) Signed: 09/08/2016 6:11:40 PM By: [name] Entered By: [name] on 09/08/2016 16:49:55 [name] ([id number]) -------------------------------------------------------------------------------- Wound Assessment Details [name] Date of Service: 09/08/2016 3:00 PM Patient Name: N. Patient Account Number: [id number] Medical Record Treating RN: [name] [id number] Number: Other Clinician: 01-30-1981 (35 y.o. Treating [name], [name] Date of Birth/Sex: Female) Physician/Extender: G Primary Care [name] Physician: Referring Physician: [name] in Treatment: 81 Wound Status Wound Number: 22 Primary Pressure Ulcer Etiology: Wound Location: Right Ischial Tuberosity Wound Open Wounding Event: Pressure Injury Status: Date Acquired: 09/07/2016 Comorbid Anemia, Type I Diabetes, End Stage Weeks Of Treatment: 0 History: Renal Disease, Rheumatoid Arthritis, Clustered Wound: No Neuropathy Photos Photo Uploaded By: Elliot Gurney, RN, [name], Kim on 09/09/2016 11:46:47 Wound Measurements Length: (cm) 0.8 % Reduction in Ar Width: (cm) 2.4 % Reduction in Vo Depth: (cm) 0.1 Epithelialization Area: (cm) 1.508 Tunneling: Volume: (cm) 0.151 Undermining: ea: 0% lume: 0% : None No No Wound Description Classification: Category/Stage II Foul Odor After Wound Margin: Flat and  Intact Exudate Amount: Medium Exudate Type: Serous Exudate Color: amber Cleansing: No Wound Bed Benscoter, Brexlee N. ([id number]) Granulation Amount: Medium (34-66%) Exposed Structure Granulation Quality: Pink Fascia Exposed: No Necrotic Amount: Medium (34-66%) Fat Layer Exposed: No Necrotic Quality: Adherent Slough Tendon Exposed: No Muscle Exposed: No Joint Exposed: No Bone Exposed: No Limited to Skin Breakdown Periwound Skin Texture Texture Color No Abnormalities Noted: No No Abnormalities Noted: No Callus: No Atrophie Blanche: No Crepitus: No Cyanosis: No Excoriation: No Ecchymosis: No Fluctuance: No Erythema: No Friable: No Hemosiderin Staining: No Induration: No Mottled: No Localized Edema: No Pallor: No Rash: No Rubor: No Scarring: No Moisture No Abnormalities Noted: No Dry / Scaly: No Maceration: No Moist: No Wound Preparation Ulcer Cleansing: Rinsed/Irrigated with Saline Topical Anesthetic Applied: None Treatment Notes Wound #22 (Right Ischial Tuberosity) 1. Cleansed with: Clean wound with Normal Saline 5. Secondary Dressing Applied Bordered Foam Dressing Electronic Signature(s) Signed: 09/08/2016 6:11:40 PM By: [name] Entered By: [name] on 09/08/2016 16:50:42 [name] ([id number]) -------------------------------------------------------------------------------- Wound Assessment Details [name] Date of Service: 09/08/2016 3:00 PM Patient Name: N. Patient Account Number: [id number] Medical Record Treating RN: [name] [id number] Number: Other Clinician: Dec 18, 1980 (35 y.o. Treating [name], [name] Date of Birth/Sex: Female) Physician/Extender: G Primary Care [name] Physician: Referring Physician: [name] in Treatment: 89 Wound Status Wound  Number: 9 Primary Diabetic Wound/Ulcer of the Lower Etiology: Extremity Wound Location: Left Calcaneous Wound Open Wounding Event:  Pressure Injury Status: Date Acquired: 07/28/2015 Comorbid Anemia, Type I Diabetes, End Stage Weeks Of Treatment: 53 History: Renal Disease, Rheumatoid Arthritis, Clustered Wound: No Neuropathy Photos Photo Uploaded By: Elliot Gurney, RN, [name], Kim on 09/09/2016 11:46:15 Wound Measurements Length: (cm) 2.7 Width: (cm) 2.5 Depth: (cm) 0.1 Area: (cm) 5.301 Volume: (cm) 0.53 % Reduction in Area: 71.5% % Reduction in Volume: 71.5% Epithelialization: None Tunneling: No Undermining: No Wound Description Classification: Grade 2 Foul Odor After Wound Margin: Flat and Intact Exudate Amount: Large Exudate Type: Serosanguineous Exudate Color: red, brown Cleansing: No Wound Bed Lewter, Annete N. ([id number]) Granulation Amount: Medium (34-66%) Exposed Structure Granulation Quality: Red Fascia Exposed: No Necrotic Amount: Medium (34-66%) Fat Layer Exposed: No Necrotic Quality: Eschar, Adherent Slough Tendon Exposed: No Muscle Exposed: No Joint Exposed: No Bone Exposed: No Limited to Skin Breakdown Periwound Skin Texture Texture Color No Abnormalities Noted: No No Abnormalities Noted: No Callus: No Atrophie Blanche: No Crepitus: No Cyanosis: No Excoriation: No Ecchymosis: No Fluctuance: No Erythema: No Friable: No Hemosiderin Staining: No Induration: No Mottled: No Localized Edema: No Pallor: No Rash: No Rubor: No Scarring: No Moisture No Abnormalities Noted: No Dry / Scaly: No Maceration: No Moist: Yes Wound Preparation Ulcer Cleansing: Rinsed/Irrigated with Saline Topical Anesthetic Applied: Other: lidocaine 4%, Treatment Notes Wound #9 (Left Calcaneous) 1. Cleansed with: Clean wound with Normal Saline 2. Anesthetic Topical Lidocaine 4% cream to wound bed prior to debridement 4. Dressing Applied: Prisma Ag 5. Secondary Dressing Applied ABD Pad Bordered Foam Dressing Kerlix/Conform Electronic Signature(s) Signed: 09/08/2016 6:11:40 PM By: Donella Stade, Ledell Peoples ([id number]) Entered By: [name] on 09/08/2016 16:50:59 [name] ([id number]) -------------------------------------------------------------------------------- Vitals Details [name] Date of Service: 09/08/2016 3:00 PM Patient Name: N. Patient Account Number: [id number] Medical Record Treating RN: [name] [id number] Number: Other Clinician: 01-11-1981 (35 y.o. Treating [name], [name] Date of Birth/Sex: Female) Physician/Extender: G Primary Care [name] Physician: Referring Physician: [name] in Treatment: 84 Vital Signs Time Taken: 15:14 Pulse (bpm): 65 Height (in): 68 Respiratory Rate (breaths/min): 16 Weight (lbs): 96 Blood Pressure (mmHg): 71/43 Body Mass Index (BMI): 14.6 Reference Range: 80 - 120 mg / dl Electronic Signature(s) Signed: 09/08/2016 6:11:40 PM By: [name] Entered By: [name] on 09/08/2016 15:15:08

## 2016-09-09 NOTE — Progress Notes (Addendum)
[name], [name] ([id number]) Visit Report for 09/08/2016 Chief Complaint Document Details [name], [name] Date of Service: 09/08/2016 3:00 PM Patient Name: N. Patient Account Number: [id number] Medical Record Treating RN: [name] [id number] Number: Other Clinician: 1981-09-21 (35 y.o. Treating Tyjae Issa Date of Birth/Sex: Female) Physician/Extender: G Primary Care [name] Physician: Referring Physician: [name] in Treatment: 84 Information Obtained from: Patient Chief Complaint Ms. Vides returns for evaluation of multiple ulcerations Electronic Signature(s) Signed: 09/08/2016 5:30:41 PM By: Baltazar Najjar MD Entered By: Baltazar Najjar on 09/08/2016 16:59:07 [name] ([id number]) -------------------------------------------------------------------------------- Debridement Details [name] Date of Service: 09/08/2016 3:00 PM Patient Name: N. Patient Account Number: [id number] Medical Record Treating RN: [name] [id number] Number: Other Clinician: October 25, 1980 (35 y.o. Treating Tyeson Tanimoto Date of Birth/Sex: Female) Physician/Extender: G Primary Care [name] Physician: Referring Physician: [name] in Treatment: 28 Debridement Performed for 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,Posterior Amputation Site - Below Knee Assessment: Performed By: Physician [name], MD Debridement: Debridement Pre-procedure Yes - 15:54 Verification/Time Out Taken: Start Time: 15:54 Pain Control: Lidocaine 4% Topical Solution Level: Skin/Subcutaneous Tissue Total Area Debrided (L x 4.3 (cm) x 3.4 (cm) = 14.62 (cm) W): Tissue and other Viable, Non-Viable, Fibrin/Slough, Subcutaneous material debrided: Instrument: Curette Specimen: Swab Number of Specimens 1 Taken: Bleeding: Minimum Hemostasis Achieved: Pressure End Time: 15:59 Procedural Pain: 0 Post Procedural Pain: 0 Response to Treatment: Procedure  was tolerated well Post Debridement Measurements of Total Wound Length: (cm) 4.3 Stage: Category/Stage III Width: (cm) 3.4 Depth: (cm) 0.7 Volume: (cm) 8.038 Character of Wound/Ulcer Post Improved Debridement: Severity of Tissue Post Fat layer exposed Debridement: Post Procedure Diagnosis [name], [name] ([id number]) Same as Pre-procedure Electronic Signature(s) Signed: 09/08/2016 5:30:41 PM By: Baltazar Najjar MD Signed: 09/08/2016 6:11:40 PM By: [name] Entered By: Baltazar Najjar on 09/08/2016 16:56:32 [name] ([id number]) -------------------------------------------------------------------------------- Debridement Details [name] Date of Service: 09/08/2016 3:00 PM Patient Name: N. Patient Account Number: [id number] Medical Record Treating RN: [name] [id number] Number: Other Clinician: 1981-08-10 (35 y.o. Treating Inice Sanluis Date of Birth/Sex: Female) Physician/Extender: G Primary Care [name] Physician: Referring Physician: [name] in Treatment: 22 Debridement Performed for Wound #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 Assessment: Performed By: Physician [name], MD Debridement: Debridement Pre-procedure Yes - 15:49 Verification/Time Out Taken: Start Time: 15:49 Pain Control: Lidocaine 4% Topical Solution Level: Skin/Subcutaneous Tissue Total Area Debrided (L x 0.9 (cm) x 0.9 (cm) = 0.81 (cm) W): Tissue and other Viable, Non-Viable, Fibrin/Slough, Subcutaneous material debrided: Instrument: Curette Bleeding: Minimum Hemostasis Achieved: Pressure End Time: 15:52 Procedural Pain: 0 Post Procedural Pain: 0 Response to Treatment: Procedure was tolerated well Post Debridement Measurements of Total Wound Length: (cm) 0.9 Width: (cm) 9 Depth: (cm) 0.4 Volume: (cm) 2.545 Character of Wound/Ulcer Post Improved Debridement: Severity of Tissue Post Debridement: Fat layer  exposed Post Procedure Diagnosis Same as Pre-procedure Electronic Signature(s) [name], [name] ([id number]) Signed: 09/08/2016 5:30:41 PM By: Baltazar Najjar MD Signed: 09/08/2016 6:11:40 PM By: [name] Entered By: Baltazar Najjar on 09/08/2016 16:56:46 [name] ([id number]) -------------------------------------------------------------------------------- Debridement Details [name] Date of Service: 09/08/2016 3:00 PM Patient Name: N. Patient Account Number: [id number] Medical Record Treating RN: [name] [id number] Number: Other Clinician: 08-29-1981 (35 y.o. Treating Wauneta Silveria Date of Birth/Sex: Female) Physician/Extender: G Primary Care [name] Physician: Referring Physician: [name] in Treatment: 53 Debridement Performed for Wound #21 Left,Lateral Foot Assessment: Performed By: Physician [name], MD Debridement: Debridement Pre-procedure Yes -  16:01 Verification/Time Out Taken: Start Time: 16:01 Pain Control: Lidocaine 4% Topical Solution Level: Skin/Subcutaneous Tissue Total Area Debrided (L x 2.5 (cm) x 0.8 (cm) = 2 (cm) W): Tissue and other Viable, Non-Viable, Fibrin/Slough, Subcutaneous material debrided: Instrument: Blade, Forceps Bleeding: None End Time: 16:04 Procedural Pain: 0 Post Procedural Pain: 0 Response to Treatment: Procedure was tolerated well Post Debridement Measurements of Total Wound Length: (cm) 2.5 Stage: Unstageable/Unclassified Width: (cm) 0.8 Depth: (cm) 0.1 Volume: (cm) 0.157 Character of Wound/Ulcer Post Improved Debridement: Severity of Tissue Post Fat layer exposed Debridement: Post Procedure Diagnosis Same as Pre-procedure Electronic Signature(s) [name], [name] ([id number]) Signed: 09/08/2016 5:30:41 PM By: Baltazar Najjar MD Signed: 09/08/2016 6:11:40 PM By: [name] Entered By: Baltazar Najjar on 09/08/2016 16:57:00 [name] ([id number]) -------------------------------------------------------------------------------- HPI Details [name] Date of Service: 09/08/2016 3:00 PM Patient Name: N. Patient Account Number: [id number] Medical Record Treating RN: [name] [id number] Number: Other Clinician: 1980/10/14 (35 y.o. Treating Nirel Babler Date of Birth/Sex: Female) Physician/Extender: G Primary Care [name] Physician: Referring Physician: [name] in Treatment: 104 History of Present Illness Location: dry gangrene both feet and heels Quality: Patient reports No Pain. Severity: Patient states wound are getting worse. Duration: Patient has had the wound for > 4months prior to seeking treatment at the wound center Context: The wound appeared gradually over time Modifying Factors: she has been in and out of hospital over the last 2 months Associated Signs and Symptoms: Patient reports having difficulty standing for long periods. HPI Description: [name] is a 35 y.o. female who presents to our wound center, back in June 2016, referred by her PCP Dr. Zada Finders for nonhealing ulcers on the lateral aspect of the right heel. Of note she has a history of type 1 diabetes mellitus that has been uncontrolled. Past medical history significant for type 1 diabetes mellitus not controlled, ankylosing spondylitis, anorexia nervosa, irritable bowel syndrome, chronic kidney disease, chronic diarrhea. she then developed gangrene of both feet due to severe peripheral vascular disease and also had gangrene of the tips of her fingers due to upper extremity vascular disease. She was being worked up by vascular surgery at [facility] and at [facility] and has had several procedures done there. She started with hyperbaric oxygen therapy and had a total of 40 treatments the last one being on 06/20/2015. After the initial treatment of hyperbaric oxygen therapy she started having ear problems  and had ultimately to use myringotomy tubes and this was done bilaterally. Since then her ears have been doing fine. In late September, she had seen vascular and hand surgeons. since then she's been in [place] at the rec center for surgery involving extensive vascular procedures for the upper extremities. She was then at [facility] with GI bleeds both upper and lower and has been in and out of hospital for that. She has recently been out of hospital for the last week. 09/09/2015 -- she was unable to get here in time to start her hyperbaric oxygen today and hence is only here for a wound care visit. 09/19/2015 -- she has been having vancomycin during her dialysis and continues to have vascular appointments and the procedure is been set for early January. She has been unable to make it for her hemodialysis due to various medical symptoms. 09/30/2014 -- her vancomycin was stopped on 09/25/2015 and the mother has noticed the right foot has started draining for the last 3 days. Addendum: after examining the patient I was able to  talk to her primary vascular surgeon Dr. Pernell Dupre at the [facility]. I told him about the necrotic area on the plantar aspect of right foot which is now wet gangrene and he agreed with me that he would admit her at [facility] under her care and synchronize further treatment. We have discussed her poor prognosis and he and I discussed the need for hospice care [facility], [name] ([id number]) and for sitting down and talking to the patient and her mother and giving them a proper detail of the prognosis. 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 She was worked up with MRIs, abdomen aortogram and placement of left-sided angioplasties were done. After a prolonged hospital she she was discharged home and was told to wear shrinker sock and stump protector and see her surgeon for further instructions regarding wound care and suture removal. Asked to take long-term doxycycline. 01/15/16; this is a patient I haven't seen before although she is been followed by Dr. Meyer Russel in this clinic today. She is a type I diabetic with severe PAD macrovascular disease. She has had a previous BKA. She has dry gangrene of the tips of her fingers which she showed me on the right to. She also has dry gangrene of the left first second and third toes and a portion of her proximal foot around these areas. She is followed by vascular surgery at [facility] and saw them recently they are not going to do surgery as of yet. She has a large black eschar over her heel which is beginning to separate in some areas. As I understand think she is paining these with Betadine. There is been some suggestion about retrying hyperbarics on her although she is still not able to commit to the frequency of treatment that would be necessary to see improvement. She is also on Monday Wednesday Friday dialysis 01/21/16; the patient returns to see me today with regards to the left heel. Apparently she is not scheduled for any further attempts at revascularization of the left foot. 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 01/28/16; the patient returns today for attention to the left foot specifically the left calcaneal ulcer. This is covered in a thick black eschar. I crosshatched this last week and we have been using Santyl. 02/05/16; I continue to work on the thick black eschar on the patient's left calcaneus. She is using Santyl that this side  crosshatched this area and the eschar is beginning to loosen. 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 This is beginning to separate and liquefy as well especially between the first and second toes. In the time being her major complaint is fatigue at dialysis 02/26/16 I continue to work on the patient's left calcaneus thick adherent black eschar. I crosshatched this area and we have been using Santyl although it is very adherent area I remove some nonviable tissue today what I can see of this actually looks surprisingly good.  On the same foot she has dry gangrene on the first and second toes and part of the forefoot underneath this. This is beginning to separate. 03/11/16; the patient comes in for her every 2 week appointment. I have been working on the black eschar on her heel. The patient apparently again has to come off dialysis early today after 2 hours due to severe complaints of nausea. She really does not look well. 04/02/2016 -- the patient has not been seen here for about 3 weeks now and has a new issue with the stump of the right amputation site and also her right posterior thigh. They have only noticed this for the last couple of days. 04/28/16; I had received a call from the patient's surgeon at [facility]. She had a left transmetatarsal amputation and Integra applied to the left heel. Both of these areas appear to be doing well. There are dressing these with [facility] and they will return to their surgeon on Thursday. She has a new injury on the popliteal fossa on the right which I think was trauma from her stump. 05/20/16; the patient is following with her surgeon at [facility]. She's had a left transmetatarsal debridement of the left heel she had Integra place and apparently is using some consternation of Epson salts soaks, Betadine and Aquacel Ag. The left leg is  wrapped I did not look at this today. She has the wound in her right popliteal fossa which is apparently a pressure area possibly related to sitting on a toilet for 2-3 hours multiple times a day. 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 We have been using Santyl to the area on the right popliteal fossa. She has Mallicoat, Marchetta N. ([id number]) a new wound on her right gluteal area but the patient would not let me look at that today 06/03/16; patient is not doing particularly well. She now has a pressure area over her left ischial tuberosity. This is of quite a size and covered with an adherent surface slough. She looks as though she has lost weight, I didn't want to go ahead and attempt to debridement this today. There is really no evidence of infection. The area behind the right popliteal fossa looks about the same as 2 weeks ago we have been using Santyl to this area. I did not look at her left foot which is wrapped been followed by podiatry at [facility] 06/10/16; patient arrives in clinic actually looking a lot brighter than I usually see her, predictably she did not go to dialysis today. For the first time in perhaps 6 weeks I actually saw her left foot today. The transmetatarsal site is healed. The left heel has a reasonably stable-looking wound which has a clean base. Some eschar superiorly and a few sutures remain in place. More worrisome is an area on her lateral left foot over the metatarsal head. Quarter size necrotic wound that probes to bone. There is some purulence here which I cultured there is nothing that looks like a healthy base of this area. They've been using silver alginate to this area at home. She also has a large wound in the right popliteal fossa, and again a pressure area over the left ischial tuberosity. We are using Santyl to both of these areas. Both allays looks somewhat better than last  week, using Santyl to both areas 06/17/16: culture from last week grew citrobacter and amp sensitive enterococcus fecalis. Started on vanc last Saturday  at dialysis. have spoken to dialysis in [place] re adjustment in antibiotics added ceftazidine to vanc 06/24/16; the patient was discharged from [facility] yesterday. She had an IandD of the open area on the lateral aspect of her foot. She continues on vancomycin and Ceptaz again at dialysis although I'm not exactly sure of the current duration of this. She also had a debridement of the wound over the left greater trochanter done by the wound care team. She is receiving Santyl based dressings to this and the area in her right popliteal fossa. She arrives today completely fatigued from dialysis. I spoke to Dr. Allena Katz her podiatrist and surgeon at [facility] and asked if we could manage the wound VAC which I think we can. He also wanted to ask about hyperbaric oxygen with the indication of chronic osteomyelitis although at this point I am not completely certain where the chronic osteomyelitis is. Finally she apparently has had a re-graft to the area on her left heel which is either a skin graft or Integra 07/15/16; patient arrives today she is not been in the clinic since I saw her on 9/27. She's been using Santyl to the left greater trochanter, I popliteal fossa. Her home health nurse remove the wound VAC from the transmetatarsal head, there is only a small wound on the medial aspect of the foot. Dr. Allena Katz who is been managing the heel wounds has requested Xeroform to the heel. 07/22/16; patient arrives as usual postdialysis she is very fatigued and weak looking. She generally tolerates dialysis poorly requiring midodrine tosupport her blood pressure. She continues on vancomycin and I believe ceftazadine at dialysis for chronic osteomyelitis oShe has a wound on the lateral left transmetatarsal amputation. This was debrided of surface slough nonviable  subcutaneous tissue the base of this looks healthy and improved. oHer left heel wound is followed by by Dr. Allena Katz at [facility] her surgeon. We have been applying Xeroform to this at his request, he'll oShe has a deep wound over her left greater trochanter however this does not appear to be infected has healthy granulation and I think could be well served by a wound VAC with collagen under the foam oUnder the right popliteal fossa there is a pressure area apparently from a toilet seat. This has been making progress again the tissue here appears to be healthy we have been using silver collagen in this area as well oThere is a new small area on the lateral aspect of her right stump which appears to be draining purulent material. Our nurses obtained a specimen of this for culture. As noted she is already onVAncomycin e a third generation cephalosporin 08/05/16 -Ms. Shiffman arrives today accompanied by her aunt and cousin, she did not receive dialysis today due to "feeling bad" and is scheduled to received dialysis tomorrow. She has completed vancomycin that she was receiving with dialysis. She has an appointment with podiatry on Friday; admits to using right prosthesis for transfers primarily [place], ALEEHA BOLINE. ([id number]) -She has a wound on the lateral left TMA site, essentially unchanged and stable; staples remain in medial aspect of healed surgical site from August, patient states that the plan is to remove staples on Friday -Left heel ulcer appears stable with granulation tissue -stage III left trochanter pressure ulcer with peri-wound irritation, appears to be from leakage despite patient stating that VAC has not leaked, has been using Prisma and wound VAC -Right popliteal fossa pressure ulcer is stable -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 and  pale erythema to lateral aspect, improved from last week, although purulence able to be expressed, will encourage warm compresses to aide in bring  fluid to surface - new areas of DTI to left foot dorsal and lateral aspect; patient admits that foot has been wrapped with ace per podiatry, she also wears surgical shoe to left foot; dorsal DTI has intact blister; dorsal DTI not present at last appointment, lateral DTI present but significantly smaller at last appointment; both areas (dorsal>lateral) concerning for threatening limb loss, new to size and location - she has seen "limb loss specialist" at [facility] in the past and was encouraged to follow up with that provider in presence of new development 09/08/16 I have not seen this patient in quite some time perhaps late October. She was seen once here at the beginning of November however her appointments are scheduled after dialysis at which time she doesn't feel well enough to come. Also noted recently our staff of taken several phone calls from her home health nurse that been concerned about worsening wounds on the back of the right knee left greater trochanter. We brought her in today to go over this. As far as I can tell the wounds are as follows; #1 pressure ulcer on the back of her right knee in the popliteal fossa. As best I can tell this is caused by prolonged sitting on the toilet seat related to ongoing issues with profuse, high-volume diarrhea #2 pressure ulcer over the left greater trochanter. We have been using a wound VAC on this area #3 probing ulcer on the medial aspect of her left transmetatarsal amputation #4 pressure ulcer on the left posterior calcaneus #5 excoriation on the lateral aspect of her left foot which is not clearly open but was concerning last time she was here for a deep tissue injury. She is off antibiotics at dialysis since the last time I saw her. This was for underlying osteomyelitis. Electronic Signature(s) Signed: 09/08/2016 5:30:41 PM By: Baltazar Najjar MD Entered By: Baltazar Najjar on 09/08/2016 17:02:49 [name]  ([id number]) -------------------------------------------------------------------------------- Physical Exam Details [name] Date of Service: 09/08/2016 3:00 PM Patient Name: N. Patient Account Number: [id number] Medical Record Treating RN: [name] [id number] Number: Other Clinician: 15-Dec-1980 (35 y.o. Treating Zlata Alcaide Date of Birth/Sex: Female) Physician/Extender: G Primary Care [name] Physician: Referring Physician: [name] in Treatment: 94 Constitutional Patient is hypotensive.. Pulse regular and within target range for patient.Marland Kitchen Respirations regular, non-labored and within target range.. Temperature is normal and within the target range for the patient.. The patient is once again hypotensive although she appears well was cognizant and engages in conversation.. Notes Wound exam; #1 right popliteal fossa. This is larger than I remember but not to deep [stage III however] debrided of copious amounts of surface slough that cleans up quite nicely. There is some erythema around this and the patient and her mother report copious drainage #2 the area over the left greater trochanter looks stable there is still a probing area medially here but no drainage and no evidence of surrounding infection #3 area on the medial aspect of her left foot was debrided with a #3 curet of nonviable subcutaneous tissue. Once again this wound is not as deep as I remember. #4 she has a new skin tear on the right initial tuberosity although this does not look too bad fairly superficial #5 excoriated area over the lateral aspect of his foot was removed with pickups and ablated I did not remove all  of this but I did not come to an open area Electronic Signature(s) Signed: 09/08/2016 5:30:41 PM By: Baltazar Najjar MD Entered By: Baltazar Najjar on 09/08/2016 17:05:44 [name]  ([id number]) -------------------------------------------------------------------------------- Physician Orders Details [name] Date of Service: 09/08/2016 3:00 PM Patient Name: N. Patient Account Number: [id number] Medical Record Treating RN: [name] [id number] Number: Other Clinician: [birth year] (35 y.o. Treating Dontavis Tschantz Date of Birth/Sex: Female) Physician/Extender: G Primary Care [name] Physician: Referring Physician: [name] in Treatment: 17 Verbal / Phone Orders: Yes Clinician: [name] Read Back and Verified: Yes Diagnosis Coding Wound Cleansing 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,Posterior Amputation Site - Below Knee o Clean wound with Normal Saline. o Cleanse wound with mild soap and water Wound #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 o Clean wound with Normal Saline. o Cleanse wound with mild soap and water Wound #18 Left Ischial Tuberosity o Clean wound with Normal Saline. o Cleanse wound with mild soap and water Wound #20 Left,Dorsal Foot o Clean wound with Normal Saline. o Cleanse wound with mild soap and water o Clean wound with Normal Saline. o Cleanse wound with mild soap and water Wound #20 Left,Dorsal Foot o Clean wound with Normal Saline. o Cleanse wound with mild soap and water o Clean wound with Normal Saline. o Cleanse wound with mild soap and water Wound #21 Left,Lateral Foot o Clean wound with Normal Saline. o Cleanse wound with mild soap and water Wound #22 Right Ischial Tuberosity o Clean wound with Normal Saline. o Cleanse wound with mild soap and water Wound #9 Left Calcaneous Strain, Caria N. ([id number]) o Clean wound with Normal Saline. 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,Posterior Amputation Site - Below Knee o Topical Lidocaine 4% cream applied to wound bed prior to debridement Wound #17 Left Amputation Site -  Transmetatarsal o Topical Lidocaine 4% cream applied to wound bed prior to debridement Wound #18 Left Ischial Tuberosity o Topical Lidocaine 4% cream applied to wound bed prior to debridement Wound #20 Left,Dorsal Foot o Topical Lidocaine 4% cream applied to wound bed prior to debridement o Topical Lidocaine 4% cream applied to wound bed prior to debridement Wound #20 Left,Dorsal Foot o Topical Lidocaine 4% cream applied to wound bed prior to debridement o Topical Lidocaine 4% cream applied to wound bed prior to debridement Wound #21 Left,Lateral Foot o Topical Lidocaine 4% cream applied to wound bed prior to debridement Wound #22 Right Ischial Tuberosity o Topical Lidocaine 4% cream applied to wound bed prior to debridement Wound #9 Left Calcaneous o Topical Lidocaine 4% cream applied to wound bed prior to debridement Primary Wound Dressing 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,Posterior Amputation Site - Below Knee o Prisma Ag - or collagen with silver equivalent - may use this under the NPWT dressing Wound #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 o Prisma Ag - or collagen with silver equivalent - may use this under the NPWT dressing Wound #18 Left Ischial Tuberosity o Prisma Ag - or collagen with silver equivalent - may use this under the NPWT dressing Wound #20 Left,Dorsal Foot o Other: - bordered foam dressing only o Other: - bordered foam dressing only Wound #20 Left,Dorsal Foot o Other: - bordered foam dressing only o Other: - bordered foam dressing only Wound #21 Left,Lateral Foot Coor, Braedyn N. 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,Posterior Amputation Site - Below Knee o Boardered Foam Dressing - on R posterior  BKA site wrap lightly with conform Wound #18 Left Ischial Tuberosity o Boardered Foam Dressing - on R posterior BKA site wrap lightly with conform Wound #22 Right Ischial Tuberosity o Boardered Foam Dressing - on R posterior BKA site wrap lightly with conform Dressing Change Frequency 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,Posterior Amputation Site - Below Knee o Change Dressing Monday, Wednesday, Friday - and as needed for soilage Wound #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 o Change Dressing Monday, Wednesday, Friday - and as needed for soilage Wound #18 Left Ischial Tuberosity o Change Dressing Monday, Wednesday, Friday - and as needed for soilage Wound #20 Left,Dorsal Foot o Change Dressing Monday, Wednesday, Friday - and as needed for soilage o Change Dressing Monday, Wednesday, Friday - and as needed for soilage Wound #20 Left,Dorsal Foot o Change Dressing Monday, Wednesday, Friday - and as needed for soilage o Change Dressing Monday, Wednesday, Friday - and as needed for soilage Wound #21 Left,Lateral Foot o Change Dressing Monday, Wednesday, Friday - and as needed for soilage Wound #22 Right Ischial Tuberosity o Change Dressing Monday, Wednesday, Friday - and as needed for soilage Wound #9 Left Calcaneous o Change Dressing Monday, Wednesday, Friday - and as needed for soilage Follow-up Appointments 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 BRIELYNN, SEKULA ([id number]) o Other: - as patient is able Wound #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 o Return Appointment in 1 week. o Other: - as patient is able Wound #18 Left Ischial Tuberosity o Return Appointment in 1 week. o Other: - as patient is able Wound #20 Left,Dorsal Foot o Return Appointment in 1 week. o Other: - as patient is able o Return Appointment in 1 week. o Other: - as patient is able Wound #20 Left,Dorsal Foot o Return  Appointment in 1 week. o Other: - as patient is able o Return Appointment in 1 week. o Other: - as patient is able Wound #21 Left,Lateral Foot o Return Appointment in 1 week. o Other: - as patient is able Wound #22 Right Ischial Tuberosity o Return Appointment in 1 week. o Other: - as patient is able Wound #9 Left Calcaneous o Return Appointment in 1 week. 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 Wound #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 o Increase protein intake. Wound #18 Left Ischial Tuberosity o Increase protein intake. Wound #20 Left,Dorsal Foot o Increase protein intake. o Increase protein intake. Wound #20 Left,Dorsal Foot Ebron, Roshelle N. ([id number]) o Increase protein intake. o Increase protein intake. Wound #21 Left,Lateral Foot o Increase protein intake. Wound #22 Right Ischial Tuberosity o Increase protein intake. Wound #9 Left Calcaneous o Increase protein intake. 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 o FACE TO FACE ENCOUNTER: MEDICARE and MEDICAID PATIENTS: I certify that this patient is under my care and that I had a face-to-face encounter that meets the physician face-to-face encounter requirements with this patient on this date. The encounter with the patient was in whole or in part for the following MEDICAL CONDITION: (primary reason for  Home Healthcare) MEDICAL NECESSITY: I certify, that based on my findings, NURSING services are a medically necessary home health service. HOME BOUND STATUS: I certify that my clinical findings support that this patient is homebound (i.e., Due to illness or injury, pt requires aid  of supportive devices such as crutches, cane, wheelchairs, walkers, the use of special transportation or the assistance of another person to leave their place of residence. There is a normal inability to leave the home and doing so requires considerable and taxing effort. Other absences are for medical reasons / religious services and are infrequent or of short duration when for other reasons). o If current dressing causes regression in wound condition, may D/C ordered dressing product/s and apply Normal Saline Moist Dressing daily until next Wound Healing Center / Other MD appointment. Notify Wound Healing Center of regression in wound condition at [phone]. o Please direct any NON-WOUND related issues/requests for orders to patient's Primary Care Physician Wound #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 o Continue Home Health Visits - Please give Vienne all the PRN visits that she needs o Home Health Nurse may visit PRN to address patientos wound care needs. o FACE TO FACE ENCOUNTER: MEDICARE and MEDICAID PATIENTS: I certify that this patient is under my care and that I had a face-to-face encounter that meets the physician face-to-face encounter requirements with this patient on this date. The encounter with the patient was in whole or in part for the following MEDICAL CONDITION: (primary reason for Home Healthcare) MEDICAL NECESSITY: I certify, that based on my findings, NURSING services are a medically necessary home health service. HOME BOUND STATUS: I certify that my clinical findings support that this patient is homebound (i.e., Due to illness or injury, pt requires aid of supportive devices such as crutches, cane, wheelchairs, walkers, the use of special transportation or the assistance of another person to leave their place of residence. There is a normal inability to leave the home and doing so requires considerable and taxing effort. Other absences are for medical  reasons / religious services and are infrequent or of short duration when for other reasons). [name] EvesBLACKBURN, Valicia N. ([id number]) o If current dressing causes regression in wound condition, may D/C ordered dressing product/s and apply Normal Saline Moist Dressing daily until next Wound Healing Center / Other MD appointment. Notify Wound Healing Center of regression in wound condition at [phone]. o Please direct any NON-WOUND related issues/requests for orders to patient's Primary Care Physician Wound #18 Left Ischial Tuberosity o Continue Home Health Visits - Please give Sherlyn all the PRN visits that she needs o Home Health Nurse may visit PRN to address patientos wound care needs. o FACE TO FACE ENCOUNTER: MEDICARE and MEDICAID PATIENTS: I certify that this patient is under my care and that I had a face-to-face encounter that meets the physician face-to-face encounter requirements with this patient on this date. The encounter with the patient was in whole or in part for the following MEDICAL CONDITION: (primary reason for Home Healthcare) MEDICAL NECESSITY: I certify, that based on my findings, NURSING services are a medically necessary home health service. HOME BOUND STATUS: I certify that my clinical findings support that this patient is homebound (i.e., Due to illness or injury, pt requires aid of supportive devices such as crutches, cane, wheelchairs, walkers, the use of special transportation or the assistance of another person to leave their place of residence. There is a normal inability to leave the home and doing so requires considerable and taxing  effort. Other absences are for medical reasons / religious services and are infrequent or of short duration when for other reasons). o If current dressing causes regression in wound condition, may D/C ordered dressing product/s and apply Normal Saline Moist Dressing daily until next Wound Healing Center / Other  MD appointment. Notify Wound Healing Center of regression in wound condition at [phone]. o Please direct any NON-WOUND related issues/requests for orders to patient's Primary Care Physician Wound #20 Left,Dorsal Foot o Continue Home Health Visits - Please give Samuel all the PRN visits that she needs o Home Health Nurse may visit PRN to address patientos wound care needs. o FACE TO FACE ENCOUNTER: MEDICARE and MEDICAID PATIENTS: I certify that this patient is under my care and that I had a face-to-face encounter that meets the physician face-to-face encounter requirements with this patient on this date. The encounter with the patient was in whole or in part for the following MEDICAL CONDITION: (primary reason for Home Healthcare) MEDICAL NECESSITY: I certify, that based on my findings, NURSING services are a medically necessary home health service. HOME BOUND STATUS: I certify that my clinical findings support that this patient is homebound (i.e., Due to illness or injury, pt requires aid of supportive devices such as crutches, cane, wheelchairs, walkers, the use of special transportation or the assistance of another person to leave their place of residence. There is a normal inability to leave the home and doing so requires considerable and taxing effort. Other absences are for medical reasons / religious services and are infrequent or of short duration when for other reasons). o If current dressing causes regression in wound condition, may D/C ordered dressing product/s and apply Normal Saline Moist Dressing daily until next Wound Healing Center / Other MD appointment. Notify Wound Healing Center of regression in wound condition at [phone]. o Please direct any NON-WOUND related issues/requests for orders to patient's Primary Care Physician o Continue Home Health Visits - Please give Denya all the PRN visits that she needs o Home Health Nurse may visit PRN to  address patientos wound care needs. ROMANDA, TURRUBIATES ([id number]) o FACE TO FACE ENCOUNTER: MEDICARE and MEDICAID PATIENTS: I certify that this patient is under my care and that I had a face-to-face encounter that meets the physician face-to-face encounter requirements with this patient on this date. The encounter with the patient was in whole or in part for the following MEDICAL CONDITION: (primary reason for Home Healthcare) MEDICAL NECESSITY: I certify, that based on my findings, NURSING services are a medically necessary home health service. HOME BOUND STATUS: I certify that my clinical findings support that this patient is homebound (i.e., Due to illness or injury, pt requires aid of supportive devices such as crutches, cane, wheelchairs, walkers, the use of special transportation or the assistance of another person to leave their place of residence. There is a normal inability to leave the home and doing so requires considerable and taxing effort. Other absences are for medical reasons / religious services and are infrequent or of short duration when for other reasons). o If current dressing causes regression in wound condition, may D/C ordered dressing product/s and apply Normal Saline Moist Dressing daily until next Wound Healing Center / Other MD appointment. Notify Wound Healing Center of regression in wound condition at [phone]. o Please direct any NON-WOUND related issues/requests for orders to patient's Primary Care Physician Wound #20 Left,Dorsal Foot o Continue Home Health Visits - Please give Shontay all the PRN visits that  she needs o Home Health Nurse may visit PRN to address patientos wound care needs. o FACE TO FACE ENCOUNTER: MEDICARE and MEDICAID PATIENTS: I certify that this patient is under my care and that I had a face-to-face encounter that meets the physician face-to-face encounter requirements with this patient on this date. The encounter with  the patient was in whole or in part for the following MEDICAL CONDITION: (primary reason for Home Healthcare) MEDICAL NECESSITY: I certify, that based on my findings, NURSING services are a medically necessary home health service. HOME BOUND STATUS: I certify that my clinical findings support that this patient is homebound (i.e., Due to illness or injury, pt requires aid of supportive devices such as crutches, cane, wheelchairs, walkers, the use of special transportation or the assistance of another person to leave their place of residence. There is a normal inability to leave the home and doing so requires considerable and taxing effort. Other absences are for medical reasons / religious services and are infrequent or of short duration when for other reasons). o If current dressing causes regression in wound condition, may D/C ordered dressing product/s and apply Normal Saline Moist Dressing daily until next Wound Healing Center / Other MD appointment. Notify Wound Healing Center of regression in wound condition at [phone]. o Please direct any NON-WOUND related issues/requests for orders to patient's Primary Care Physician o Continue Home Health Visits - Please give Wajiha all the PRN visits that she needs o Home Health Nurse may visit PRN to address patientos wound care needs. o FACE TO FACE ENCOUNTER: MEDICARE and MEDICAID PATIENTS: I certify that this patient is under my care and that I had a face-to-face encounter that meets the physician face-to-face encounter requirements with this patient on this date. The encounter with the patient was in whole or in part for the following MEDICAL CONDITION: (primary reason for Home Healthcare) MEDICAL NECESSITY: I certify, that based on my findings, NURSING services are a medically necessary home health service. HOME BOUND STATUS: I certify that my clinical findings support that this patient is homebound (i.e., Due to illness or  injury, pt requires aid of supportive devices such as crutches, cane, wheelchairs, walkers, the use of special transportation or the assistance of another person to leave their place of residence. There is a normal inability to leave the home and doing so requires considerable and taxing effort. BERLIE, PERSKY ([id number]) absences are for medical reasons / religious services and are infrequent or of short duration when for other reasons). o If current dressing causes regression in wound condition, may D/C ordered dressing product/s and apply Normal Saline Moist Dressing daily until next Wound Healing Center / Other MD appointment. Notify Wound Healing Center of regression in wound condition at [phone]. o Please direct any NON-WOUND related issues/requests for orders to patient's Primary Care Physician Wound #21 Left,Lateral Foot o Continue Home Health Visits - Please give Shelita all the PRN visits that she needs o Home Health Nurse may visit PRN to address patientos wound care needs. o FACE TO FACE ENCOUNTER: MEDICARE and MEDICAID PATIENTS: I certify that this patient is under my care and that I had a face-to-face encounter that meets the physician face-to-face encounter requirements with this patient on this date. The encounter with the patient was in whole or in part for the following MEDICAL CONDITION: (primary reason for Home Healthcare) MEDICAL NECESSITY: I certify, that based on my findings, NURSING services are a medically necessary home health service. HOME BOUND  STATUS: I certify that my clinical findings support that this patient is homebound (i.e., Due to illness or injury, pt requires aid of supportive devices such as crutches, cane, wheelchairs, walkers, the use of special transportation or the assistance of another person to leave their place of residence. There is a normal inability to leave the home and doing so requires considerable and taxing  effort. Other absences are for medical reasons / religious services and are infrequent or of short duration when for other reasons). o If current dressing causes regression in wound condition, may D/C ordered dressing product/s and apply Normal Saline Moist Dressing daily until next Wound Healing Center / Other MD appointment. Notify Wound Healing Center of regression in wound condition at [phone]. o Please direct any NON-WOUND related issues/requests for orders to patient's Primary Care Physician Wound #22 Right Ischial Tuberosity o Continue Home Health Visits - Please give Malky all the PRN visits that she needs o Home Health Nurse may visit PRN to address patientos wound care needs. o FACE TO FACE ENCOUNTER: MEDICARE and MEDICAID PATIENTS: I certify that this patient is under my care and that I had a face-to-face encounter that meets the physician face-to-face encounter requirements with this patient on this date. The encounter with the patient was in whole or in part for the following MEDICAL CONDITION: (primary reason for Home Healthcare) MEDICAL NECESSITY: I certify, that based on my findings, NURSING services are a medically necessary home health service. HOME BOUND STATUS: I certify that my clinical findings support that this patient is homebound (i.e., Due to illness or injury, pt requires aid of supportive devices such as crutches, cane, wheelchairs, walkers, the use of special transportation or the assistance of another person to leave their place of residence. There is a normal inability to leave the home and doing so requires considerable and taxing effort. Other absences are for medical reasons / religious services and are infrequent or of short duration when for other reasons). o If current dressing causes regression in wound condition, may D/C ordered dressing product/s and apply Normal Saline Moist Dressing daily until next Wound Healing Center / Other  MD appointment. Notify Wound Healing Center of regression in wound condition at [phone]. o Please direct any NON-WOUND related issues/requests for orders to patient's Primary Care Physician APRILE, DICKENSON ([id number]) Wound #9 Left Calcaneous o Continue Home Health Visits - Please give Robinn all the PRN visits that she needs o Home Health Nurse may visit PRN to address patientos wound care needs. o FACE TO FACE ENCOUNTER: MEDICARE and MEDICAID PATIENTS: I certify that this patient is under my care and that I had a face-to-face encounter that meets the physician face-to-face encounter requirements with this patient on this date. The encounter with the patient was in whole or in part for the following MEDICAL CONDITION: (primary reason for Home Healthcare) MEDICAL NECESSITY: I certify, that based on my findings, NURSING services are a medically necessary home health service. HOME BOUND STATUS: I certify that my clinical findings support that this patient is homebound (i.e., Due to illness or injury, pt requires aid of supportive devices such as crutches, cane, wheelchairs, walkers, the use of special transportation or the assistance of another person to leave their place of residence. There is a normal inability to leave the home and doing so requires considerable and taxing effort. Other absences are for medical reasons / religious services and are infrequent or of short duration when for other reasons). o If current dressing  causes regression in wound condition, may D/C ordered dressing product/s and apply Normal Saline Moist Dressing daily until next Wound Healing Center / Other MD appointment. Notify Wound Healing Center of regression in wound condition at [phone]. o Please direct any NON-WOUND related issues/requests for orders to patient's Primary Care Physician Negative Pressure Wound Therapy Wound #18 Left Ischial Tuberosity o Wound VAC settings at  125/130 mmHg continuous pressure. Use BLACK/GREEN foam to wound cavity. Use WHITE foam to fill any tunnel/s and/or undermining. Change VAC dressing 3 X WEEK. Change canister as indicated when full. Nurse may titrate settings and frequency of dressing changes as clinically indicated. - place some white foam under the undermining as well as the Prisma place foam around the peri wound o Home Health Nurse may d/c VAC for s/s of increased infection, significant wound regression, or uncontrolled drainage. Notify Wound Healing Center at [phone]. 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 Patient Account Number: [id number] Medical Record Treating RN: [name] [id number] Number: Other Clinician: 08/14/1981 (35 y.o. 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 ([id number]) L89.899 Pressure ulcer of other site, unspecified stage 08/06/2016 Yes L89.223 Pressure ulcer of left hip, stage 3 08/05/2016 Yes Inactive Problems Resolved Problems ICD-10 Code Description Active Date Resolved Date L02.611 Cutaneous abscess of right foot 10/01/2015 10/01/2015 Electronic Signature(s) Signed: 09/08/2016 5:30:41 PM By: Baltazar Najjar MD Entered By: Baltazar Najjar on 09/08/2016 16:56:16 [name] ([id number]) -------------------------------------------------------------------------------- Progress Note Details [name] Date of Service: 09/08/2016 3:00 PM Patient Name: N. Patient Account Number: [id number] Medical Record Treating RN: [name] [id number] Number: Other Clinician: 08-13-1981 (35 y.o. Treating Tymesha Ditmore Date of Birth/Sex: Female) Physician/Extender: G Primary Care [name] Physician: Referring Physician: [name] in Treatment: 28 Subjective Chief Complaint Information obtained from Patient Ms. Dilger returns for evaluation of multiple ulcerations History of Present Illness (HPI) The following HPI elements were documented for the patient's wound: Location: dry gangrene both feet and heels Quality: Patient reports No Pain. Severity: Patient states wound are getting worse. Duration: Patient has had the wound for > 4months prior to seeking treatment at the wound center Context: The wound appeared gradually over time Modifying Factors: she has been in and out of hospital over the last 2 months Associated Signs and Symptoms: Patient reports  having difficulty standing for long periods.  Shantay Sonn is a 35 y.o. female who presents to our wound center, back in June 2016, referred by her PCP Dr. Zada Finders for nonhealing ulcers on the lateral aspect of the right heel. Of note she has a history of type 1 diabetes mellitus that has been uncontrolled. Past medical history significant for type 1 diabetes mellitus not controlled, ankylosing spondylitis, anorexia nervosa, irritable bowel syndrome, chronic kidney disease, chronic diarrhea. she then developed gangrene of both feet due to severe peripheral vascular disease and also had gangrene of the tips of her fingers due to upper extremity vascular disease. She was being worked up by vascular surgery at [facility] and at [facility] and has had several procedures done there. She started with hyperbaric oxygen therapy and had a total of 40 treatments the last one being on 06/20/2015. After the initial treatment of hyperbaric oxygen therapy she started having ear problems and had ultimately to use myringotomy tubes and this was done bilaterally. Since then her ears have been doing fine. In late September, she had seen vascular and hand surgeons. since then she's been in [place] at the rec center for surgery involving extensive vascular procedures for the upper extremities. She was then at [facility] with GI bleeds both upper and lower and has been in and out of hospital for that. She has recently been out of hospital for the last week. 09/09/2015 -- she was unable to get here in time to start her hyperbaric oxygen today and hence is only here for a wound care visit. 09/19/2015 -- she has been having vancomycin during her dialysis and continues to have vascular appointments and the procedure is been set for early January. She has been unable to make it for her [name], [name] ([id number]) hemodialysis due to various medical symptoms. 09/30/2014 -- her vancomycin was stopped on 09/25/2015 and  the mother has noticed the right foot has started draining for the last 3 days. Addendum: after examining the patient I was able to talk to her primary vascular surgeon Dr. Pernell Dupre at the [facility]. I told him about the necrotic area on the plantar aspect of right foot which is now wet gangrene and he agreed with me that he would admit her at [facility] under her care and synchronize further treatment. We have discussed her poor prognosis and he and I discussed the need for hospice care and for sitting down and talking to the patient and her mother and giving them a proper detail of the prognosis. 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 She was worked up with MRIs, abdomen aortogram and placement of left-sided angioplasties were done. After a prolonged hospital she she was discharged home and was told to wear shrinker sock and stump protector and see her surgeon for further instructions regarding wound care and suture removal. Asked to take long-term doxycycline. 01/15/16; this is a patient I haven't seen before although she is been followed by Dr. Meyer Russel in this clinic today. She is a type I diabetic with severe PAD macrovascular disease. She has had a previous BKA. She has dry gangrene of the tips of her fingers which she showed me  on the right to. She also has dry gangrene of the left first second and third toes and a portion of her proximal foot around these areas. She is followed by vascular surgery at [facility] and saw them recently they are not going to do surgery as of yet. She has a large black  eschar over her heel which is beginning to separate in some areas. As I understand think she is paining these with Betadine. There is been some suggestion about retrying hyperbarics on her although she is still not able to commit to the frequency of treatment that would be necessary to see improvement. She is also on Monday Wednesday Friday dialysis 01/21/16; the patient returns to see me today with regards to the left heel. Apparently she is not scheduled for any further attempts at revascularization of the left foot. 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 01/28/16; the patient returns today for attention to the left foot specifically the left calcaneal ulcer. This is covered in a thick black eschar. I crosshatched this last week and we have been using Santyl. 02/05/16; I continue to work on the thick black eschar on the patient's left calcaneus. She is using Santyl that this side crosshatched this area and the eschar is beginning to loosen. 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 This is beginning to separate and liquefy as well especially between the first and second toes. In the time being her major complaint is fatigue at dialysis 02/26/16 I continue to work on the patient's left calcaneus thick adherent black eschar. I crosshatched this area and we have been using Santyl although it is very adherent area I remove some nonviable tissue today what I can see of this actually looks surprisingly good. On the same foot she has dry gangrene on the first and second toes and part of the forefoot underneath this. This is beginning to separate. 03/11/16; the patient comes in for her every 2 week appointment. I have been working on the black eschar on her heel. The patient apparently again has  to come off dialysis early today after 2 hours due to severe complaints of nausea. She really does not look well. 04/02/2016 -- the patient has not been seen here for about 3 weeks now and has a new issue with the stump of the right amputation site and also her right posterior thigh. They have only noticed this for the last couple of days. 04/28/16; I had received a call from the patient's surgeon at [facility]. She had a left transmetatarsal amputation and Integra applied to the left heel. Both of these areas appear to be doing well. There are dressing these Fischel, Joelee N. ([id number]) with Kandis Mannan and they will return to their surgeon on Thursday. She has a new injury on the popliteal fossa on the right which I think was trauma from her stump. 05/20/16; the patient is following with her surgeon at [facility]. She's had a left transmetatarsal debridement of the left heel she had Integra place and apparently is using some consternation of Epson salts soaks, Betadine and Aquacel Ag. The left leg is wrapped I did not look at this today. She has the wound in her right popliteal fossa which is apparently a pressure area possibly related to sitting on a  toilet for 2-3 hours multiple times a day. 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 We have been using Santyl to the area on the right popliteal fossa. She has a new wound on her right gluteal area but the patient would not let me look at that today 06/03/16; patient is not doing particularly well. She now has a pressure area over her left ischial tuberosity. This is of quite a size and covered with an adherent surface slough. She looks as though she has lost weight, I didn't want to go ahead and attempt to debridement this today. There is really no evidence of infection. The area behind the right popliteal fossa looks about the same as 2 weeks ago we have been using Santyl to this  area. I did not look at her left foot which is wrapped been followed by podiatry at [facility] 06/10/16; patient arrives in clinic actually looking a lot brighter than I usually see her, predictably she did not go to dialysis today. For the first time in perhaps 6 weeks I actually saw her left foot today. The transmetatarsal site is healed. The left heel has a reasonably stable-looking wound which has a clean base. Some eschar superiorly and a few sutures remain in place. More worrisome is an area on her lateral left foot over the metatarsal head. Quarter size necrotic wound that probes to bone. There is some purulence here which I cultured there is nothing that looks like a healthy base of this area. They've been using silver alginate to this area at home. She also has a large wound in the right popliteal fossa, and again a pressure area over the left ischial tuberosity. We are using Santyl to both of these areas. Both allays looks somewhat better than last week, using Santyl to both areas 06/17/16: culture from last week grew citrobacter and amp sensitive enterococcus fecalis. Started on vanc last Saturday at dialysis. have spoken to dialysis in [place] re adjustment in antibiotics added ceftazidine to vanc 06/24/16; the patient was discharged from [facility] yesterday. She had an IandD of the open area on the lateral aspect of her foot. She continues on vancomycin and Ceptaz again at dialysis although I'm not exactly sure of the current duration of this. She also had a debridement of the wound over the left greater trochanter done by the wound care team. She is receiving Santyl based dressings to this and the area in her right popliteal fossa. She arrives today completely fatigued from dialysis. I spoke to Dr. Allena Katz her podiatrist and surgeon at [facility] and asked if we could manage the wound VAC which I think we can. He also wanted to ask about hyperbaric oxygen with the indication of chronic osteomyelitis  although at this point I am not completely certain where the chronic osteomyelitis is. Finally she apparently has had a re-graft to the area on her left heel which is either a skin graft or Integra 07/15/16; patient arrives today she is not been in the clinic since I saw her on 9/27. She's been using Santyl to the left greater trochanter, I popliteal fossa. Her home health nurse remove the wound VAC from the transmetatarsal head, there is only a small wound on the medial aspect of the foot. Dr. Allena Katz who is been managing the heel wounds has requested Xeroform to the heel. 07/22/16; patient arrives as usual postdialysis she is very fatigued and weak looking. She generally tolerates dialysis  poorly requiring midodrine tosupport her blood pressure. 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 This was debrided of surface slough nonviable subcutaneous tissue the base of this looks healthy and improved. Her left heel wound is followed by by Dr. Allena Katz at [facility] her surgeon. We have been applying Xeroform to this at his request, he'll She has a deep wound over her left greater trochanter however this does not appear to be infected has healthy granulation and I think could be well served by a wound VAC with collagen under the foam Under the right popliteal fossa there is a pressure area apparently from a toilet seat. This has been making progress again the tissue here appears to be healthy we have been using silver collagen in this area as well Bhatt, Arissa N. ([id number]) There is a new small area on the lateral aspect of her right stump which appears to be draining purulent material. Our nurses obtained a specimen of this for culture. As noted she is already onVAncomycin e a third generation cephalosporin 08/05/16 -Ms. Klose arrives today accompanied by her aunt and cousin, she did not  receive dialysis today due to "feeling bad" and is scheduled to received dialysis tomorrow. She has completed vancomycin that she was receiving with dialysis. She has an appointment with podiatry on Friday; admits to using right prosthesis for transfers primarily -She has a wound on the lateral left TMA site, essentially unchanged and stable; staples remain in medial aspect of healed surgical site from August, patient states that the plan is to remove staples on Friday -Left heel ulcer appears stable with granulation tissue -stage III left trochanter pressure ulcer with peri-wound irritation, appears to be from leakage despite patient stating that VAC has not leaked, has been using Prisma and wound VAC -Right popliteal fossa pressure ulcer is stable -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 and pale erythema to lateral aspect, improved from last week, although purulence able to be expressed, will encourage warm compresses to aide in bring fluid to surface - new areas of DTI to left foot dorsal and lateral aspect; patient admits that foot has been wrapped with ace per podiatry, she also wears surgical shoe to left foot; dorsal DTI has intact blister; dorsal DTI not present at last appointment, lateral DTI present but significantly smaller at last appointment; both areas (dorsal>lateral) concerning for threatening limb loss, new to size and location - she has seen "limb loss specialist" at [facility] in the past and was encouraged to follow up with that provider in presence of new development 09/08/16 I have not seen this patient in quite some time perhaps late October. She was seen once here at the beginning of November however her appointments are scheduled after dialysis at which time she doesn't feel well enough to come. Also noted recently our staff of taken several phone calls from her home health nurse that been concerned about worsening wounds on the back of the right knee left  greater trochanter. We brought her in today to go over this. As far as I can tell the wounds are as follows; #1 pressure ulcer on the back of her right knee in the popliteal fossa. As best I can tell this is caused by prolonged sitting on the toilet seat related to ongoing issues with profuse, high-volume diarrhea #2 pressure ulcer over the left greater trochanter. We have been using a wound VAC on  this area #3 probing ulcer on the medial aspect of her left transmetatarsal amputation #4 pressure ulcer on the left posterior calcaneus #5 excoriation on the lateral aspect of her left foot which is not clearly open but was concerning last time she was here for a deep tissue injury. She is off antibiotics at dialysis since the last time I saw her. This was for underlying osteomyelitis. Objective Constitutional Patient is hypotensive.. Pulse regular and within target range for patient.Marland Kitchen Respirations regular, non-labored and within target range.. Temperature is normal and within the target range for the patient.. The patient is [name], [name] ([id number]) once again hypotensive although she appears well was cognizant and engages in conversation.. Vitals Time Taken: 3:14 PM, Height: 68 in, Weight: 96 lbs, BMI: 14.6, Pulse: 65 bpm, Respiratory Rate: 16 breaths/min, Blood Pressure: 71/43 mmHg. General Notes: Wound exam; #1 right popliteal fossa. This is larger than I remember but not to deep [stage III however] debrided of copious amounts of surface slough that cleans up quite nicely. There is some erythema around this and the patient and her mother report copious drainage #2 the area over the left greater trochanter looks stable there is still a probing area medially here but no drainage and no evidence of surrounding infection #3 area on the medial aspect of her left foot was debrided with a #3 curet of nonviable subcutaneous tissue. Once again this wound is not as deep as I remember. #4 she  has a new skin tear on the right initial tuberosity although this does not look too bad fairly superficial #5 excoriated area over the lateral aspect of his foot was removed with pickups and ablated I did not remove all of this but I did not come to an open area Integumentary (Hair, Skin) Wound #16 status is Open. Original cause of wound was Pressure Injury. 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 The wound measures 4.3cm length x 3.4cm width x 0.7cm depth; 11.483cm^2 area and 8.038cm^3 volume. There is fat exposed. There is no tunneling or undermining noted. There is a large amount of purulent drainage noted. The wound margin is distinct with the outline attached to the wound base. There is medium (34-66%) red, pink granulation within the wound bed. There is a medium (34-66%) amount of necrotic tissue within the wound bed including Adherent Slough. The periwound skin appearance exhibited: Localized Edema, Moist, Erythema. The periwound skin appearance did not exhibit: Callus, Crepitus, Excoriation, Fluctuance, Friable, Induration, Rash, Scarring, Dry/Scaly, Maceration, Atrophie Blanche, Cyanosis, Ecchymosis, Hemosiderin Staining, Mottled, Pallor, Rubor. The surrounding wound skin color is noted with erythema which is circumferential. Periwound temperature was noted as No Abnormality. The periwound has tenderness on palpation. Wound #17 status is Open. Original cause of wound was Surgical Injury. The wound is located on the Left Amputation Site - Transmetatarsal. The wound measures 0.9cm length x 0.9cm width x 0.4cm depth; 0.636cm^2 area and 0.254cm^3 volume. The wound is limited to skin breakdown. There is no tunneling or undermining noted. There is a large amount of serous drainage noted. The wound margin is flat and intact. There is medium (34-66%) red granulation within the wound bed. There is a medium (34-66%) amount of necrotic tissue within the wound bed.  The periwound skin appearance exhibited: Moist. The periwound skin appearance did not exhibit: Callus, Crepitus, Excoriation, Fluctuance, Friable, Induration, Localized Edema, Rash, Scarring, Dry/Scaly, Maceration, Atrophie Blanche, Cyanosis, Ecchymosis, Hemosiderin Staining, Mottled, Pallor, Rubor, Erythema. Wound #18 status is Open. Original  cause of wound was Pressure Injury. The wound is located on the Left Ischial Tuberosity. The wound measures 2.5cm length x 2.4cm width x 0.4cm depth; 4.712cm^2 area and 1.885cm^3 volume. The wound is limited to skin breakdown. There is no tunneling or undermining noted. There is a large amount of serosanguineous drainage noted. The wound margin is flat and intact. There is large (67-100%) pink granulation within the wound bed. There is a small (1-33%) amount of necrotic tissue within the wound bed including Adherent Slough. The periwound skin appearance exhibited: Moist, Erythema. The periwound skin appearance did not exhibit: Callus, Crepitus, Excoriation, Fluctuance, Friable, Induration, Localized Edema, Rash, Scarring, Dry/Scaly, Maceration, Atrophie Blanche, Cyanosis, Ecchymosis, Hemosiderin Staining, Mottled, Pallor, Rubor. The surrounding wound skin color is noted with erythema which is circumferential. [name], Arvada N. ([id number]) Wound #19 status is Open. Original cause of wound was Gradually Appeared. The wound is located on the Right,Lateral Amputation Site - Below Knee. The wound measures 0cm length x 0cm width x 0cm depth; 0cm^2 area and 0cm^3 volume. Wound #20 status is Open. Original cause of wound was Pressure Injury. The wound is located on the Left,Dorsal Foot. The wound measures 0.9cm length x 2.4cm width x 0.1cm depth; 1.696cm^2 area and 0.17cm^3 volume. Wound #21 status is Open. Original cause of wound was Pressure Injury. The wound is located on the Left,Lateral Foot. The wound measures 2.5cm length x 0.8cm width x 0.1cm depth;  1.571cm^2 area and 0.157cm^3 volume. The wound is limited to skin breakdown. There is no tunneling or undermining noted. There is a none present amount of drainage noted. The wound margin is flat and intact. There is no granulation within the wound bed. There is a large (67-100%) amount of necrotic tissue within the wound bed including Eschar. The periwound skin appearance did not exhibit: Callus, Crepitus, Excoriation, Fluctuance, Friable, Induration, Localized Edema, Rash, Scarring, Dry/Scaly, Maceration, Moist, Atrophie Blanche, Cyanosis, Ecchymosis, Hemosiderin Staining, Mottled, Pallor, Rubor, Erythema. Wound #22 status is Open. Original cause of wound was Pressure Injury. The wound is located on the Right Ischial Tuberosity. The wound measures 0.8cm length x 2.4cm width x 0.1cm depth; 1.508cm^2 area and 0.151cm^3 volume. The wound is limited to skin breakdown. There is no tunneling or undermining noted. There is a medium amount of serous drainage noted. The wound margin is flat and intact. There is medium (34-66%) pink granulation within the wound bed. There is a medium (34-66%) amount of necrotic tissue within the wound bed including Adherent Slough. The periwound skin appearance did not exhibit: Callus, Crepitus, Excoriation, Fluctuance, Friable, Induration, Localized Edema, Rash, Scarring, Dry/Scaly, Maceration, Moist, Atrophie Blanche, Cyanosis, Ecchymosis, Hemosiderin Staining, Mottled, Pallor, Rubor, Erythema. Wound #9 status is Open. Original cause of wound was Pressure Injury. The wound is located on the Left Calcaneous. The wound measures 2.7cm length x 2.5cm width x 0.1cm depth; 5.301cm^2 area and 0.53cm^3 volume. The wound is limited to skin breakdown. There is no tunneling or undermining noted. There is a large amount of serosanguineous drainage noted. The wound margin is flat and intact. There is medium (34-66%) red granulation within the wound bed. There is a medium (34-66%)  amount of necrotic tissue within the wound bed including Eschar and Adherent Slough. The periwound skin appearance exhibited: Moist. The periwound skin appearance did not exhibit: Callus, Crepitus, Excoriation, Fluctuance, Friable, Induration, Localized Edema, Rash, Scarring, Dry/Scaly, Maceration, Atrophie Blanche, Cyanosis, Ecchymosis, Hemosiderin Staining, Mottled, Pallor, Rubor, Erythema. Assessment Active Problems ICD-10 E10.621 - Type 1 diabetes mellitus with foot ulcer  E10.52 - Type 1 diabetes mellitus with diabetic peripheral angiopathy with gangrene I70.245 - Atherosclerosis of native arteries of left leg with ulceration of other part of foot I70.261 - Atherosclerosis of native arteries of extremities with gangrene, right leg L89.622 - Pressure ulcer of left heel, stage 2 Buice, Tomiko N. 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 There was a Skin/Subcutaneous Tissue Debridement ([id number]) debridement with total area of 14.62 sq cm performed by [name], MD. with the following instrument(s): Curette to remove Viable and Non-Viable tissue/material including Fibrin/Slough and Subcutaneous after achieving pain control using Lidocaine 4% Topical Solution. 1 Specimen was taken by a Swab and sent to the lab per facility protocol.A time out was conducted at 15:54, prior to the start of the procedure. A Minimum amount of bleeding was controlled with Pressure. The procedure was tolerated well with a pain level of 0 throughout and a pain level of 0 following the  procedure. Post Debridement Measurements: 4.3cm length x 3.4cm width x 0.7cm depth; 8.038cm^3 volume. Post debridement Stage noted as Category/Stage III. Character of Wound/Ulcer Post Debridement is improved. Severity of Tissue Post Debridement is: Fat layer exposed. Post procedure Diagnosis Wound #16: Same as Pre-Procedure Wound #17 Wound #17 is an Open Surgical Wound located on the Left Amputation Site - Transmetatarsal . There was a Skin/Subcutaneous Tissue Debridement ([id number]) debridement with total area of 0.81 sq cm performed by [name], MD. with the following instrument(s): Curette to remove Viable and Non-Viable tissue/material including Fibrin/Slough and Subcutaneous after achieving pain control using Lidocaine 4% Topical Solution. A time out was conducted at 15:49, prior to the start of the procedure. A Minimum amount of bleeding was controlled with Pressure. The procedure was tolerated well with a pain level of 0 throughout and a pain level of 0 following the procedure. Post Debridement Measurements: 0.9cm length x 9cm width x 0.4cm depth; 2.545cm^3 volume. Character of Wound/Ulcer Post Debridement is improved. Severity of Tissue Post Debridement is: Fat layer exposed. Post procedure Diagnosis Wound #17: Same as Pre-Procedure Wound #21 Wound #21 is a Pressure Ulcer located on the Left,Lateral Foot . There was a Skin/Subcutaneous Tissue Debridement ([id number]) debridement with total area of 2 sq cm performed by [name], MD. with the following instrument(s): Blade and Forceps to remove Viable and Non-Viable tissue/material including Fibrin/Slough and Subcutaneous after achieving pain control using Lidocaine 4% Topical Solution. A time out was conducted at 16:01, prior to the start of the procedure. There was no bleeding. The procedure was tolerated well with a pain level of 0 throughout and a pain level of 0 following the procedure. [name], [name] N.  ([id number]) Post Debridement Measurements: 2.5cm length x 0.8cm width x 0.1cm depth; 0.157cm^3 volume. Post debridement Stage noted as Unstageable/Unclassified. Character of Wound/Ulcer Post Debridement is improved. Severity of Tissue Post Debridement is: Fat layer exposed. Post procedure Diagnosis Wound #21: Same as Pre-Procedure Plan Wound Cleansing: 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 Cleanse wound with mild soap and water Wound #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: Clean wound with Normal  Saline. Cleanse wound with mild soap and water Wound #18 Left Ischial Tuberosity: Clean wound with Normal Saline. Cleanse wound with mild soap and water Wound #20 Left,Dorsal Foot: Clean wound with Normal Saline. Cleanse wound with mild soap and water Clean wound with Normal Saline. Cleanse wound with mild soap and water Wound #20 Left,Dorsal Foot: Clean wound with Normal Saline. Cleanse wound with mild soap and water Clean wound with Normal Saline. Cleanse wound with mild soap and water Wound #21 Left,Lateral Foot: Clean wound with Normal Saline. Cleanse wound with mild soap and water Wound #22 Right Ischial Tuberosity: Clean wound with Normal Saline. Cleanse wound with mild soap and water Wound #9 Left Calcaneous: Clean wound with Normal Saline. 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,Posterior Amputation Site - Below Knee: Topical Lidocaine 4% cream applied to wound bed prior to debridement Wound #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: Topical Lidocaine 4% cream applied to wound bed prior to debridement Wound #18 Left Ischial Tuberosity: Topical Lidocaine 4% cream applied to wound bed prior to debridement Wound #20 Left,Dorsal Foot: ZAKKIYYA, BARNO. ([id number]) Topical Lidocaine 4% cream applied to wound bed prior to debridement Topical Lidocaine 4% cream applied to wound bed prior to  debridement Wound #20 Left,Dorsal Foot: Topical Lidocaine 4% cream applied to wound bed prior to debridement Topical Lidocaine 4% cream applied to wound bed prior to debridement Wound #21 Left,Lateral Foot: Topical Lidocaine 4% cream applied to wound bed prior to debridement Wound #22 Right Ischial Tuberosity: Topical Lidocaine 4% cream applied to wound bed prior to debridement Wound #9 Left Calcaneous: Topical Lidocaine 4% cream applied to wound bed prior to debridement Primary Wound Dressing: 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,Posterior Amputation Site - Below Knee: Prisma Ag - or collagen with silver equivalent - may use this under the NPWT dressing Wound #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: Prisma Ag - or collagen with silver equivalent - may use this under the NPWT dressing Wound #18 Left Ischial Tuberosity: Prisma Ag - or collagen with silver equivalent - may use this under the NPWT dressing Wound #20 Left,Dorsal Foot: Other: - bordered foam dressing only Other: - bordered foam dressing only Wound #20 Left,Dorsal Foot: Other: - bordered foam dressing only Other: - bordered foam dressing only Wound #21 Left,Lateral Foot: Other: - bordered foam dressing only Wound #22 Right Ischial Tuberosity: Other: - bordered foam dressing only Wound #9 Left Calcaneous: Prisma Ag - or collagen with silver equivalent - may use this under the NPWT dressing Secondary Dressing: 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,Posterior Amputation Site - Below Knee: Boardered Foam Dressing - on R posterior BKA site wrap lightly with conform Wound #18 Left Ischial Tuberosity: Boardered Foam Dressing - on R posterior BKA site wrap lightly with conform Wound #22 Right Ischial Tuberosity: Boardered Foam Dressing - on R posterior BKA site wrap lightly with conform Dressing Change Frequency: 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,Posterior Amputation Site - Below Knee: Change Dressing Monday, Wednesday, Friday - and as needed for soilage Wound #17 Left Amputation  Site - Transmetatarsal: Change Dressing Monday, Wednesday, Friday - and as needed for soilage Wound #18 Left Ischial Tuberosity: Change Dressing Monday, Wednesday, Friday - and as needed for soilage Wound #20 Left,Dorsal Foot: Change Dressing Monday, Wednesday, Friday - and as needed for soilage Change Dressing Monday, Wednesday, Friday - and as needed for soilage Wound #20 Left,Dorsal Foot: Change Dressing Monday, Wednesday, Friday - and as needed for soilage Change Dressing Monday, Wednesday, Friday - and as needed for soilage Wound #  21 Left,Lateral Foot: BEMNET, TROVATO ([id number]) Change Dressing Monday, Wednesday, Friday - and as needed for soilage Wound #22 Right Ischial Tuberosity: Change Dressing Monday, Wednesday, Friday - and as needed for soilage Wound #9 Left Calcaneous: Change Dressing Monday, Wednesday, Friday - and as needed for soilage Follow-up Appointments: 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 Other: - as patient is able Wound #18 Left Ischial Tuberosity: Return Appointment in 1 week. Other: - as patient is able Wound #20 Left,Dorsal Foot: Return Appointment in 1 week. Other: - as patient is able Return Appointment in 1 week. Other: - as patient is able Wound #20 Left,Dorsal Foot: Return Appointment in 1 week. Other: - as patient is able Return Appointment in 1 week. Other: - as patient is able Wound #21 Left,Lateral Foot: Return Appointment in 1 week. Other: - as patient is able Wound #22 Right Ischial Tuberosity: Return Appointment in 1 week. Other: - as patient is able Wound #9 Left Calcaneous: Return Appointment in 1 week. 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 Wound #17 Left  Amputation Site - Transmetatarsal: Increase protein intake. Wound #18 Left Ischial Tuberosity: Increase protein intake. Wound #20 Left,Dorsal Foot: Increase protein intake. Increase protein intake. Wound #20 Left,Dorsal Foot: Increase protein intake. Increase protein intake. Wound #21 Left,Lateral Foot: Increase protein intake. Wound #22 Right Ischial Tuberosity: Increase protein intake. ROBERTHA, STAPLES ([id number]) Wound #9 Left Calcaneous: Increase protein intake. Home Health: 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,Posterior Amputation Site - Below Knee: Continue Home Health Visits - Please give Dasia all the PRN visits that she needs Home Health Nurse may visit PRN to address patient s wound care needs. FACE TO FACE ENCOUNTER: MEDICARE and MEDICAID PATIENTS: I certify that this patient is under my care and that I had a face-to-face encounter that meets the physician face-to-face encounter requirements with this patient on this date. The encounter with the patient was in whole or in part for the following MEDICAL CONDITION: (primary reason for Home Healthcare) MEDICAL NECESSITY: I certify, that based on my findings, NURSING services are a medically necessary home health service. HOME BOUND STATUS: I certify that my clinical findings support that this patient is homebound (i.e., Due to illness or injury, pt requires aid of supportive devices such as crutches, cane, wheelchairs, walkers, the use of special transportation or the assistance of another person to leave their place of residence. There is a normal inability to leave the home and doing so requires considerable and taxing effort. Other absences are for medical reasons / religious services and are infrequent or of short duration when for other reasons). If current dressing causes regression in wound condition, may D/C ordered dressing product/s and apply Normal Saline Moist Dressing daily until next Wound Healing Center / Other MD  appointment. Notify Wound Healing Center of regression in wound condition at [phone]. Please direct any NON-WOUND related issues/requests for orders to patient's Primary Care Physician Wound #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: Continue Home Health Visits - Please give Tamaiya all the PRN visits that she needs Home Health Nurse may visit PRN to address patient s wound care needs. FACE TO FACE ENCOUNTER: MEDICARE and MEDICAID PATIENTS: I certify that this patient is under my care and that I had a face-to-face encounter that meets the physician  face-to-face encounter requirements with this patient on this date. The encounter with the patient was in whole or in part for the following MEDICAL CONDITION: (primary reason for Home Healthcare) MEDICAL NECESSITY: I certify, that based on my findings, NURSING services are a medically necessary home health service. HOME BOUND STATUS: I certify that my clinical findings support that this patient is homebound (i.e., Due to illness or injury, pt requires aid of supportive devices such as crutches, cane, wheelchairs, walkers, the use of special transportation or the assistance of another person to leave their place of residence. There is a normal inability to leave the home and doing so requires considerable and taxing effort. Other absences are for medical reasons / religious services and are infrequent or of short duration when for other reasons). If current dressing causes regression in wound condition, may D/C ordered dressing product/s and apply Normal Saline Moist Dressing daily until next Wound Healing Center / Other MD appointment. Notify Wound Healing Center of regression in wound condition at [phone]. Please direct any NON-WOUND related issues/requests for orders to patient's Primary Care Physician Wound #18 Left Ischial Tuberosity: Continue Home Health Visits - Please give Brooklen all the PRN visits that she needs Home Health Nurse  may visit PRN to address patient s wound care needs. FACE TO FACE ENCOUNTER: MEDICARE and MEDICAID PATIENTS: I certify that this patient is under my care and that I had a face-to-face encounter that meets the physician face-to-face encounter requirements with this patient on this date. The encounter with the patient was in whole or in part for the following MEDICAL CONDITION: (primary reason for Home Healthcare) MEDICAL NECESSITY: I certify, that based on my findings, NURSING services are a medically necessary home health service. HOME BOUND STATUS: I certify that my clinical findings support that this patient is homebound (i.e., Due to illness or injury, pt requires aid of supportive devices such as crutches, cane, wheelchairs, walkers, the use of special transportation or the assistance of another person to leave their place of residence. There is a normal inability to leave the home and doing so requires considerable and taxing effort. Other absences are for medical reasons / religious services and are infrequent or of short duration when for other reasons). If current dressing causes regression in wound condition, may D/C ordered dressing product/s and apply Hanneman, Luba N. ([id number]) Normal Saline Moist Dressing daily until next Wound Healing Center / Other MD appointment. Notify Wound Healing Center of regression in wound condition at [phone]. Please direct any NON-WOUND related issues/requests for orders to patient's Primary Care Physician Wound #20 Left,Dorsal Foot: Continue Home Health Visits - Please give Rickeya all the PRN visits that she needs Home Health Nurse may visit PRN to address patient s wound care needs. FACE TO FACE ENCOUNTER: MEDICARE and MEDICAID PATIENTS: I certify that this patient is under my care and that I had a face-to-face encounter that meets the physician face-to-face encounter requirements with this patient on this date. The encounter with the  patient was in whole or in part for the following MEDICAL CONDITION: (primary reason for Home Healthcare) MEDICAL NECESSITY: I certify, that based on my findings, NURSING services are a medically necessary home health service. HOME BOUND STATUS: I certify that my clinical findings support that this patient is homebound (i.e., Due to illness or injury, pt requires aid of supportive devices such as crutches, cane, wheelchairs, walkers, the use of special transportation or the assistance of another person to leave their place of  residence. There is a normal inability to leave the home and doing so requires considerable and taxing effort. Other absences are for medical reasons / religious services and are infrequent or of short duration when for other reasons). If current dressing causes regression in wound condition, may D/C ordered dressing product/s and apply Normal Saline Moist Dressing daily until next Wound Healing Center / Other MD appointment. Notify Wound Healing Center of regression in wound condition at [phone]. Please direct any NON-WOUND related issues/requests for orders to patient's Primary Care Physician Continue Home Health Visits - Please give Nyeisha all the PRN visits that she needs Home Health Nurse may visit PRN to address patient s wound care needs. FACE TO FACE ENCOUNTER: MEDICARE and MEDICAID PATIENTS: I certify that this patient is under my care and that I had a face-to-face encounter that meets the physician face-to-face encounter requirements with this patient on this date. The encounter with the patient was in whole or in part for the following MEDICAL CONDITION: (primary reason for Home Healthcare) MEDICAL NECESSITY: I certify, that based on my findings, NURSING services are a medically necessary home health service. HOME BOUND STATUS: I certify that my clinical findings support that this patient is homebound (i.e., Due to illness or injury, pt requires aid of  supportive devices such as crutches, cane, wheelchairs, walkers, the use of special transportation or the assistance of another person to leave their place of residence. There is a normal inability to leave the home and doing so requires considerable and taxing effort. Other absences are for medical reasons / religious services and are infrequent or of short duration when for other reasons). If current dressing causes regression in wound condition, may D/C ordered dressing product/s and apply Normal Saline Moist Dressing daily until next Wound Healing Center / Other MD appointment. Notify Wound Healing Center of regression in wound condition at [phone]. Please direct any NON-WOUND related issues/requests for orders to patient's Primary Care Physician Wound #20 Left,Dorsal Foot: Continue Home Health Visits - Please give Jalila all the PRN visits that she needs Home Health Nurse may visit PRN to address patient s wound care needs. FACE TO FACE ENCOUNTER: MEDICARE and MEDICAID PATIENTS: I certify that this patient is under my care and that I had a face-to-face encounter that meets the physician face-to-face encounter requirements with this patient on this date. The encounter with the patient was in whole or in part for the following MEDICAL CONDITION: (primary reason for Home Healthcare) MEDICAL NECESSITY: I certify, that based on my findings, NURSING services are a medically necessary home health service. HOME BOUND STATUS: I certify that my clinical findings support that this patient is homebound (i.e., Due to illness or injury, pt requires aid of supportive devices such as crutches, cane, wheelchairs, walkers, the use of special transportation or the assistance of another person to leave their place of residence. There is a normal inability to leave the home and doing so requires considerable and taxing effort. Other absences are for medical reasons / religious services and are infrequent  or of short duration when for other reasons). If current dressing causes regression in wound condition, may D/C ordered dressing product/s and apply Normal Saline Moist Dressing daily until next Wound Healing Center / Other MD appointment. Notify Wound Mcfarren, Maddux N. ([id number]) Healing Center of regression in wound condition at [phone]. Please direct any NON-WOUND related issues/requests for orders to patient's Primary Care Physician Continue Home Health Visits - Please give Blia all the PRN  visits that she needs Home Health Nurse may visit PRN to address patient s wound care needs. FACE TO FACE ENCOUNTER: MEDICARE and MEDICAID PATIENTS: I certify that this patient is under my care and that I had a face-to-face encounter that meets the physician face-to-face encounter requirements with this patient on this date. The encounter with the patient was in whole or in part for the following MEDICAL CONDITION: (primary reason for Home Healthcare) MEDICAL NECESSITY: I certify, that based on my findings, NURSING services are a medically necessary home health service. HOME BOUND STATUS: I certify that my clinical findings support that this patient is homebound (i.e., Due to illness or injury, pt requires aid of supportive devices such as crutches, cane, wheelchairs, walkers, the use of special transportation or the assistance of another person to leave their place of residence. There is a normal inability to leave the home and doing so requires considerable and taxing effort. Other absences are for medical reasons / religious services and are infrequent or of short duration when for other reasons). If current dressing causes regression in wound condition, may D/C ordered dressing product/s and apply Normal Saline Moist Dressing daily until next Wound Healing Center / Other MD appointment. Notify Wound Healing Center of regression in wound condition at [phone]. Please direct any  NON-WOUND related issues/requests for orders to patient's Primary Care Physician Wound #21 Left,Lateral Foot: Continue Home Health Visits - Please give Offie all the PRN visits that she needs Home Health Nurse may visit PRN to address patient s wound care needs. FACE TO FACE ENCOUNTER: MEDICARE and MEDICAID PATIENTS: I certify that this patient is under my care and that I had a face-to-face encounter that meets the physician face-to-face encounter requirements with this patient on this date. The encounter with the patient was in whole or in part for the following MEDICAL CONDITION: (primary reason for Home Healthcare) MEDICAL NECESSITY: I certify, that based on my findings, NURSING services are a medically necessary home health service. HOME BOUND STATUS: I certify that my clinical findings support that this patient is homebound (i.e., Due to illness or injury, pt requires aid of supportive devices such as crutches, cane, wheelchairs, walkers, the use of special transportation or the assistance of another person to leave their place of residence. There is a normal inability to leave the home and doing so requires considerable and taxing effort. Other absences are for medical reasons / religious services and are infrequent or of short duration when for other reasons). If current dressing causes regression in wound condition, may D/C ordered dressing product/s and apply Normal Saline Moist Dressing daily until next Wound Healing Center / Other MD appointment. Notify Wound Healing Center of regression in wound condition at [phone]. Please direct any NON-WOUND related issues/requests for orders to patient's Primary Care Physician Wound #22 Right Ischial Tuberosity: Continue Home Health Visits - Please give Summerlynn all the PRN visits that she needs Home Health Nurse may visit PRN to address patient s wound care needs. FACE TO FACE ENCOUNTER: MEDICARE and MEDICAID PATIENTS: I certify that this  patient is under my care and that I had a face-to-face encounter that meets the physician face-to-face encounter requirements with this patient on this date. The encounter with the patient was in whole or in part for the following MEDICAL CONDITION: (primary reason for Home Healthcare) MEDICAL NECESSITY: I certify, that based on my findings, NURSING services are a medically necessary home health service. HOME BOUND STATUS: I certify that my clinical  findings support that this patient is homebound (i.e., Due to illness or injury, pt requires aid of supportive devices such as crutches, cane, wheelchairs, walkers, the use of special transportation or the assistance of another person to leave their place of residence. There is a normal inability to leave the home and doing so requires considerable and taxing effort. Other absences are for medical reasons / religious services and are infrequent or of short duration when for other reasons). If current dressing causes regression in wound condition, may D/C ordered dressing product/s and apply Normal Saline Moist Dressing daily until next Wound Healing Center / Other MD appointment. Notify Wound Healing Center of regression in wound condition at [phone]. TRUC, WINFREE ([id number]) Please direct any NON-WOUND related issues/requests for orders to patient's Primary Care Physician Wound #9 Left Calcaneous: Continue Home Health Visits - Please give Rylei all the PRN visits that she needs Home Health Nurse may visit PRN to address patient s wound care needs. FACE TO FACE ENCOUNTER: MEDICARE and MEDICAID PATIENTS: I certify that this patient is under my care and that I had a face-to-face encounter that meets the physician face-to-face encounter requirements with this patient on this date. The encounter with the patient was in whole or in part for the following MEDICAL CONDITION: (primary reason for Home Healthcare) MEDICAL NECESSITY: I  certify, that based on my findings, NURSING services are a medically necessary home health service. HOME BOUND STATUS: I certify that my clinical findings support that this patient is homebound (i.e., Due to illness or injury, pt requires aid of supportive devices such as crutches, cane, wheelchairs, walkers, the use of special transportation or the assistance of another person to leave their place of residence. There is a normal inability to leave the home and doing so requires considerable and taxing effort. Other absences are for medical reasons / religious services and are infrequent or of short duration when for other reasons). If current dressing causes regression in wound condition, may D/C ordered dressing product/s and apply Normal Saline Moist Dressing daily until next Wound Healing Center / Other MD appointment. Notify Wound Healing Center of regression in wound condition at [phone]. Please direct any NON-WOUND related issues/requests for orders to patient's Primary Care Physician Negative Pressure Wound Therapy: Wound #18 Left Ischial Tuberosity: Wound VAC settings at 125/130 mmHg continuous pressure. Use BLACK/GREEN foam to wound cavity. Use WHITE foam to fill any tunnel/s and/or undermining. Change VAC dressing 3 X WEEK. Change canister as indicated when full. Nurse may titrate settings and frequency of dressing changes as clinically indicated. - place some white foam under the undermining as well as the Prisma place foam around the peri wound Home Health Nurse may d/c VAC for s/s of increased infection, significant wound regression, or uncontrolled drainage. Notify Wound Healing Center at [phone]. 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 Careful attention to the medial aspect when she comes in hopefully next week #2 to the left transmetatarsal amputation wound  we continued with Iodoflex #3 to the left heel Prisma #4 to the right popliteal fossa we continue with Prisma #5 to the excoriated areas including the right initial tuberosity, lateral aspect of her left foot border foam #6 I did a culture of the right popliteal fossa due to complaints of excessive drainage. No empiric antibiotics [place], Ruby N. ([id number]) #7 although there was some urgency in this visit today I  didn't really feel that any of the wounds particularly looked ominous certainly nothing that would warrant empiric antibiotics at this point were called the dialysis for IV antibiotics. #8 in terms of general recognition the patient appears to have lost weight. I wonder about the etiology of this diarrhea which apparently was all due to diabetic autonomic neuropathy. She has a follow-up with GI the [facility]) Signed: 09/14/2016 4:57:23 PM By: Elliot Gurney RN, [name], Kim RN, [name] Signed: 09/16/2016 8:17:12 AM By: Baltazar Najjar MD Previous Signature: 09/08/2016 5:30:41 PM Version By: Baltazar Najjar MD Entered By: Elliot Gurney RN, [name], Kim on 09/14/2016 16:57:23 TELINA, KLECKLEY ([id number]) -------------------------------------------------------------------------------- SuperBill Details [name] Date of Service: 09/08/2016 Patient Name: N. Patient Account Number: [id number] Medical Record Treating RN: [name] [id number] Number: Other Clinician: 02-05-1981 (35 y.o. 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, right lower extremity S71.101A Unspecified open wound, right thigh, initial encounter L89.322 Pressure ulcer of left buttock, stage 2 L89.899 Pressure ulcer of other site, unspecified stage L89.223 Pressure ulcer of left hip, stage 3 Facility Procedures CPT4 Code: [id number] Description: 11042 - DEB SUBQ TISSUE 20 SQ CM/< ICD-10 Description Diagnosis E10.621 Type 1 diabetes mellitus with foot ulcer L89.899 Pressure ulcer of other site, unspecified stage Modifier: Quantity: 1 Physician Procedures CPT4 Code: 8469629 Gessel, HEATHE Description: 11042 - WC PHYS SUBQ TISS 20 SQ CM ICD-10 Description Diagnosis E10.621 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 ([id number]) Modifier: Quantity: 1 Electronic Signature(s) Signed: 09/08/2016 5:30:41 PM By: Baltazar Najjar MD Entered By: Baltazar Najjar on 09/08/2016 17:12:27

## 2016-09-12 LAB — AEROBIC CULTURE  (SUPERFICIAL SPECIMEN)

## 2016-09-12 LAB — AEROBIC CULTURE W GRAM STAIN (SUPERFICIAL SPECIMEN)

## 2016-09-15 ENCOUNTER — Ambulatory Visit: Payer: Medicare Other | Admitting: Internal Medicine

## 2016-09-16 ENCOUNTER — Encounter: Payer: Medicare Other | Admitting: Internal Medicine

## 2016-09-16 DIAGNOSIS — E10621 Type 1 diabetes mellitus with foot ulcer: Secondary | ICD-10-CM | POA: Diagnosis not present

## 2016-09-17 NOTE — Progress Notes (Signed)
[name], ZACKERY ([id number]) Visit Report for 09/16/2016 Arrival Information Details NATTIE, LAZENBY Date of Service: 09/16/2016 3:45 PM Patient Name: N. Patient Account Number: [id number] Medical Record Treating RN: [name] [id number] Number: Other Clinician: [birth year] (35 y.o. [name], [name] Date of Birth/Sex: Female) Physician/Extender: G Primary Care [name] Physician: Referring Physician: [name] in Treatment: 28 Visit Information History Since Last Visit Added or deleted any medications: No Patient Arrived: Wheel Chair Any new allergies or adverse reactions: No Arrival Time: 15:59 Had a fall or experienced change in No activities of daily living that may affect Accompanied By: mother risk of falls: Transfer Assistance: Manual Signs or symptoms of abuse/neglect since last No Patient Identification Verified: Yes visito Secondary Verification Process Yes Hospitalized since last visit: No Completed: Pain Present Now: No Patient Requires Transmission-Based No Precautions: Patient Has Alerts: Yes Electronic Signature(s) Signed: 09/16/2016 5:10:32 PM By: [name] Entered By: [name] on 09/16/2016 16:00:21 [name] ([id number]) -------------------------------------------------------------------------------- Encounter Discharge Information Details [name] Date of Service: 09/16/2016 3:45 PM Patient Name: N. Patient Account Number: [id number] Medical Record Treating RN: [name] [id number] Number: Other Clinician: 18-Feb-1981 (35 y.o. [name], [name] Date of Birth/Sex: Female) Physician/Extender: G Primary Care [name] Physician: Referring Physician: [name] in Treatment: 6 Encounter Discharge Information Items Discharge Pain Level: 0 Discharge Condition: Stable Ambulatory Status: Wheelchair Discharge Destination: Home Transportation: Private  Auto Accompanied By: mother Schedule Follow-up Appointment: Yes Medication Reconciliation completed and provided to Patient/Care No Nature Kueker: Provided on Clinical Summary of Care: 09/16/2016 Form Type Recipient Paper Patient HB Electronic Signature(s) Signed: 09/16/2016 5:02:09 PM By: Gwenlyn Perking Entered By: Gwenlyn Perking on 09/16/2016 17:02:09 [name] ([id number]) -------------------------------------------------------------------------------- Multi Wound Chart Details [name] Date of Service: 09/16/2016 3:45 PM Patient Name: N. Patient Account Number: [id number] Medical Record Treating RN: [name] [id number] Number: Other Clinician: [birth year] (35 y.o. 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 Below Knee - Posterior Transmetatarsal Wounding Event: Pressure Injury Surgical Injury Pressure Injury Primary Etiology: Pressure Ulcer Open Surgical Wound Pressure Ulcer Comorbid History: Anemia, Type I Diabetes, Anemia, Type I Diabetes, Anemia, Type I Diabetes, End Stage Renal Disease, End Stage Renal Disease, End Stage Renal Disease, Rheumatoid Arthritis, Rheumatoid Arthritis, Rheumatoid Arthritis, Neuropathy Neuropathy Neuropathy Date Acquired: 04/02/2016 04/13/2016 05/12/2016 Weeks of Treatment: 23 20 15  Wound Status: Open Open Open Measurements L x W x D 4.5x4.3x0.5 1x0.6x0.2 2.5x2x0.4 (cm) Area (cm) : 15.197 0.471 3.927 Volume (cm) : 7.599 0.094 1.571 % Reduction in Area: -155.90% 43.90% 70.80% % Reduction in Volume: -1179.30% -11.90%  -17.00% Position 1 (o'clock): 7 Maximum Distance 1 1.8 (cm): 4 Durden, Alauna N. ([id number]) Starting Position 1 (o'clock): Ending Position 1 7 (o'clock): Maximum Distance 1 0.6 (cm): Tunneling: No No Yes Undermining: Yes No No Classification: Category/Stage III Full Thickness Without Category/Stage III Exposed Support Structures HBO Classification: Grade 1 Grade 1 N/A Exudate Amount: Large Large Large Exudate Type: Purulent Serous Serosanguineous Exudate Color: yellow, brown, green amber red, brown Wound Margin: Distinct, outline attached Flat and Intact Flat and Intact Granulation Amount: None Present (0%) None Present (0%) Large (67-100%) Granulation Quality: N/A N/A Pink Necrotic Amount: Large (67-100%) Medium (34-66%) Small (1-33%) Necrotic  Tissue: Eschar, Adherent Slough Adherent [employer] Exposed Structures: Fat: Yes Fat: Yes Fascia: No Fascia: No Fat: No Tendon: No Tendon: No Muscle: No Muscle: No Joint: No Joint: No Bone: No Bone: No Limited to Skin Breakdown Epithelialization: None None Small (1-33%) Debridement: N/A Debridement ([id number] N/A 11047) Pre-procedure N/A 16:29 N/A Verification/Time Out Taken: Pain Control: N/A Lidocaine 4% Topical N/A Solution Tissue Debrided: N/A Fibrin/Slough, Exudates, N/A Subcutaneous Level: N/A Skin/Subcutaneous N/A Tissue Debridement Area (sq N/A 0.6 N/A cm): Instrument: N/A Curette N/A Bleeding: N/A Minimum N/A Hemostasis Achieved: N/A Pressure N/A Procedural Pain: N/A 0 N/A Post Procedural Pain: N/A 0 N/A Debridement Treatment N/A Procedure was tolerated N/A Response: well Asare, Kathee N. ([id number]) Post Debridement N/A 1x0.6x0.2 N/A Measurements L x W x D (cm) Post Debridement N/A 0.094 N/A Volume: (cm) Post Debridement N/A N/A N/A Stage: Periwound Skin Texture: Edema: Yes Edema: No Edema: No Induration: Yes Excoriation: No Excoriation: No Excoriation: No Induration:  No Induration: No Callus: No Callus: No Callus: No Crepitus: No Crepitus: No Crepitus: No Fluctuance: No Fluctuance: No Fluctuance: No Friable: No Friable: No Friable: No Rash: No Rash: No Rash: No Scarring: No Scarring: No Scarring: No Periwound Skin Moist: Yes Moist: Yes Moist: Yes Moisture: Maceration: No Maceration: No Maceration: No Dry/Scaly: No Dry/Scaly: No Dry/Scaly: No Periwound Skin Color: Erythema: Yes Ecchymosis: Yes Erythema: Yes Atrophie Blanche: No Atrophie Blanche: No Atrophie Blanche: No Cyanosis: No Cyanosis: No Cyanosis: No Ecchymosis: No Erythema: No Ecchymosis: No Hemosiderin Staining: No Hemosiderin Staining: No Hemosiderin Staining: No Mottled: No Mottled: No Mottled: No Pallor: No Pallor: No Pallor: No Rubor: No Rubor: No Rubor: No Erythema Location: Circumferential N/A Circumferential Erythema Change: No Change N/A N/A Temperature: No Abnormality N/A N/A Tenderness on Yes No No Palpation: Wound Preparation: Ulcer Cleansing: Ulcer Cleansing: Ulcer Cleansing: Rinsed/Irrigated with Rinsed/Irrigated with Rinsed/Irrigated with Saline Saline Saline Topical Anesthetic Topical Anesthetic Topical Anesthetic Applied: None, Other: Applied: Other: lidocaine Applied: Other: lidocaine lidocaine 4% 4% 4% Assessment Notes: N/A N/A N/A Procedures Performed: N/A Debridement N/A Wound Number: 20 21 22  Photos: Wound Location: Left Foot - Dorsal Left Foot - Lateral Right Ischial Tuberosity Wounding Event: Pressure Injury Pressure Injury Pressure Injury Wheller, Onia N. ([id number]) Primary Etiology: Pressure Ulcer Pressure Ulcer Pressure Ulcer Comorbid History: Anemia, Type I Diabetes, Anemia, Type I Diabetes, Anemia, Type I Diabetes, End Stage Renal Disease, End Stage Renal Disease, End Stage Renal Disease, Rheumatoid Arthritis, Rheumatoid Arthritis, Rheumatoid Arthritis, Neuropathy Neuropathy Neuropathy Date Acquired: 08/06/2016  08/06/2016 09/07/2016 Weeks of Treatment: 1 1 1  Wound Status: Open Open Open Measurements L x W x D 3.3x3.4x0.1 2.2x1.1x0.1 1.4x2.3x0.1 (cm) Area (cm) : 8.812 1.901 2.529 Volume (cm) : 0.881 0.19 0.253 % Reduction in Area: -419.60% -21.00% -67.70% % Reduction in Volume: -418.20% -21.00% -67.50% Tunneling: No No No Undermining: No No No Classification: Unstageable/Unclassified Unstageable/Unclassified Category/Stage II HBO Classification: Grade 2 Grade 1 N/A Exudate Amount: None Present None Present Medium Exudate Type: N/A N/A Serous Exudate Color: N/A N/A amber Wound Margin: Flat and Intact Flat and Intact Flat and Intact Granulation Amount: None Present (0%) None Present (0%) None Present (0%) Granulation Quality: N/A N/A N/A Necrotic Amount: Large (67-100%) Large (67-100%) Large (67-100%) Necrotic Tissue: Eschar Eschar Adherent Slough Exposed Structures: Fascia: No Fascia: No Fascia: No Fat: No Fat: No Fat: No Tendon: No Tendon: No Tendon: No Muscle: No Muscle: No Muscle: No Joint: No Joint: No Joint: No Bone: No Bone: No Bone: No Limited to Skin Limited to Skin  Limited to Skin Breakdown Breakdown Breakdown Epithelialization: None None None Debridement: N/A Debridement ([id number]- N/A 11047) Pre-procedure N/A 16:29 N/A Verification/Time Out Taken: Pain Control: N/A Lidocaine 4% Topical N/A Solution Tissue Debrided: N/A Necrotic/Eschar, N/A Fibrin/Slough, Exudates, Subcutaneous Level: N/A Skin/Subcutaneous N/A Tissue Debridement Area (sq N/A 2.42 N/A cm): Creswell, Rhythm N. ([id number]) Instrument: N/A Curette N/A Bleeding: N/A Minimum N/A Hemostasis Achieved: N/A Pressure N/A Procedural Pain: N/A 0 N/A Post Procedural Pain: N/A 0 N/A Debridement Treatment N/A Procedure was tolerated N/A Response: well Post Debridement N/A 2.2x1.1x0.1 N/A Measurements L x W x D (cm) Post Debridement N/A 0.19 N/A Volume: (cm) Post Debridement N/A  Unstageable/Unclassified N/A Stage: Periwound Skin Texture: No Abnormalities Noted Induration: Yes Edema: No Edema: No Excoriation: No Excoriation: No Induration: No Callus: No Callus: No Crepitus: No Crepitus: No Fluctuance: No Fluctuance: No Friable: No Friable: No Rash: No Rash: No Scarring: No Scarring: No Periwound Skin Dry/Scaly: Yes Maceration: No Maceration: No Moisture: Moist: No Moist: No Dry/Scaly: No Dry/Scaly: No Periwound Skin Color: No Abnormalities Noted Ecchymosis: Yes Ecchymosis: Yes Atrophie Blanche: No Atrophie Blanche: No Cyanosis: No Cyanosis: No Erythema: No Erythema: No Hemosiderin Staining: No Hemosiderin Staining: No Mottled: No Mottled: No Pallor: No Pallor: No Rubor: No Rubor: No Erythema Location: N/A N/A N/A Erythema Change: N/A N/A N/A Temperature: N/A N/A N/A Tenderness on No No No Palpation: Wound Preparation: Ulcer Cleansing: Ulcer Cleansing: Ulcer Cleansing: Rinsed/Irrigated with Rinsed/Irrigated with Rinsed/Irrigated with Saline Saline Saline Topical Anesthetic Topical Anesthetic Topical Anesthetic Applied: Other: lidocaine Applied: None Applied: None 4% Assessment Notes: N/A N/A N/A Procedures Performed: N/A Debridement N/A Wound Number: 9 N/A N/A NIHA, SREY ([id number]) Photos: N/A N/A Wound Location: Left Calcaneous N/A N/A Wounding Event: Pressure Injury N/A N/A Primary Etiology: 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 Comorbid History: Anemia, Type I Diabetes, N/A N/A End Stage Renal Disease, Rheumatoid Arthritis, Neuropathy Date Acquired: 07/28/2015 N/A N/A Weeks of Treatment: 54 N/A N/A Wound Status: Open N/A N/A Measurements L x W x D 3x2.3x0.1 N/A N/A (cm) Area (cm) : 5.419 N/A N/A Volume (cm) : 0.542 N/A N/A % Reduction in Area: 70.80% N/A N/A % Reduction in Volume: 70.80% N/A N/A Tunneling: No N/A N/A Undermining: No N/A N/A Classification: Grade 2 N/A N/A HBO  Classification: N/A N/A N/A Exudate Amount: Large N/A N/A Exudate Type: Serosanguineous N/A N/A Exudate Color: red, brown N/A N/A Wound Margin: Flat and Intact N/A N/A Granulation Amount: Medium (34-66%) N/A N/A Granulation Quality: Red N/A N/A Necrotic Amount: Medium (34-66%) N/A N/A Necrotic Tissue: Eschar, Adherent Slough N/A N/A Exposed Structures: Fat: Yes N/A N/A Fascia: No Tendon: No Muscle: No Joint: No Bone: No Epithelialization: None N/A N/A Debridement: N/A N/A N/A Pain Control: N/A N/A N/A Tissue Debrided: N/A N/A N/A Level: N/A N/A N/A Debridement Area (sq N/A N/A N/A cm): Instrument: N/A N/A N/A Bleeding: N/A N/A N/A TARNYA, ZAMBRANO ([id number]) Hemostasis Achieved: N/A N/A N/A Procedural Pain: N/A N/A N/A Post Procedural Pain: N/A N/A N/A Debridement Treatment N/A N/A N/A Response: Post Debridement N/A N/A N/A Measurements L x W x D (cm) Post Debridement N/A N/A N/A Volume: (cm) Post Debridement N/A N/A N/A Stage: Periwound Skin Texture: Edema: No N/A N/A Excoriation: No Induration: No Callus: No Crepitus: No Fluctuance: No Friable: No Rash: No Scarring: No Periwound Skin Moist: Yes N/A N/A Moisture: Maceration: No Dry/Scaly: No Periwound Skin Color: Atrophie Blanche: No N/A N/A Cyanosis: No Ecchymosis: No Erythema: No Hemosiderin Staining: No Mottled: No  Pallor: No Rubor: No Erythema Location: N/A N/A N/A Erythema Change: N/A N/A N/A Temperature: N/A N/A N/A Tenderness on No N/A N/A Palpation: Wound Preparation: Ulcer Cleansing: N/A N/A Rinsed/Irrigated with Saline Topical Anesthetic Applied: Other: lidocaine 4% Assessment Notes: large amount of dressing N/A N/A stuck to wound area. Procedures Performed: N/A N/A N/A Treatment Notes Wound #16 (Right, Posterior Amputation Site - Below Knee) Sookdeo, Leslye N. ([id number]) 1. Cleansed with: Clean wound with Normal Saline 2. Anesthetic Topical Lidocaine 4% cream to  wound bed prior to debridement 4. Dressing Applied: Aquacel Ag 5. 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 Cleansed with: Clean wound with Normal Saline 2. Anesthetic Topical Lidocaine 4% cream to wound bed prior to debridement 4. Dressing Applied: Prisma Ag 5. Secondary Dressing Applied Bordered Foam Dressing Kerlix/Conform 7. Secured with Tape Wound #18 (Left Ischial Tuberosity) 1. Cleansed with: Clean wound with Normal Saline 2. Anesthetic Topical Lidocaine 4% cream to wound bed prior to debridement 4. Dressing Applied: Prisma Ag 5. Secondary Dressing Applied Bordered Foam Dressing Wound #20 (Left, Dorsal Foot) 1. Cleansed with: Clean wound with Normal Saline 2. Anesthetic Topical Lidocaine 4% cream to wound bed prior to debridement 4. Dressing Applied: Prisma Ag 5. Secondary Dressing Applied Bordered Foam Dressing Kerlix/Conform 7. Secured with Tape Wound #21 (Left, Lateral Foot) Izard, Ludivina N. ([id number]) 1. Cleansed with: Clean wound with Normal Saline 2. Anesthetic Topical Lidocaine 4% cream to wound bed prior to debridement 4. Dressing Applied: Prisma Ag 5. Secondary Dressing Applied Bordered Foam Dressing Kerlix/Conform 7. Secured with Tape Wound #22 (Right Ischial Tuberosity) 1. Cleansed with: Clean wound with Normal Saline 2. Anesthetic Topical Lidocaine 4% cream to wound bed prior to debridement 4. Dressing Applied: Prisma Ag 5. Secondary Dressing Applied Bordered Foam Dressing Wound #9 (Left Calcaneous) 1. Cleansed with: Clean wound with Normal Saline 2. Anesthetic Topical Lidocaine 4% cream to wound bed prior to debridement 4. Dressing Applied: Prisma Ag 5. Secondary Dressing Applied Bordered Foam Dressing Kerlix/Conform 7. Secured with Secretary/administrator) Signed: 09/16/2016 5:40:41 PM By: Baltazar Najjar MD Previous Signature: 09/16/2016 5:10:32 PM  Version By: [name] Entered By: Baltazar Najjar on 09/16/2016 17:29:04 [name] ([id number]) -------------------------------------------------------------------------------- Multi-Disciplinary Care Plan Details [name] Date of Service: 09/16/2016 3:45 PM Patient Name: N. Patient Account Number: [id number] Medical Record Treating RN: [name] [id number] Number: Other Clinician: 1980/12/25 (35 y.o. [name], [name] Date of Birth/Sex: Female) Physician/Extender: G Primary Care [name] Physician: Referring Physician: [name] in Treatment: 66 Active Inactive HBO Nursing Diagnoses: Anxiety related to feelings of confinement associated with the hyperbaric oxygen chamber Anxiety related to knowledge deficit of hyperbaric oxygen therapy and treatment procedures Discomfort related to temperature and humidity changes inside hyperbaric chamber Potential for barotraumas to ears, sinuses, teeth, and lungs or cerebral gas embolism related to changes in atmospheric pressure inside hyperbaric oxygen chamber Potential for oxygen toxicity seizures related to delivery of 100% oxygen at an increased atmospheric pressure Potential for pulmonary oxygen toxicity related to delivery of 100% oxygen at an increased atmospheric pressure Goals: Barotrauma will be prevented during HBO2 Date Initiated: 08/30/2015 Goal Status: Active Patient and/or family will be able to state/discuss factors appropriate to the management of their disease process during treatment Date Initiated: 08/30/2015 Goal Status: Active Patient will tolerate the hyperbaric oxygen therapy treatment Date Initiated: 08/30/2015 Goal Status: Active Patient will tolerate the internal climate of the chamber Date Initiated: 08/30/2015 Goal Status: Active Patient/caregiver will  verbalize understanding of HBO goals, rationale, procedures and potential hazards Date Initiated:  08/30/2015 Goal Status: Active Signs and symptoms of pulmonary oxygen toxicity will be recognized and promptly addressed Date Initiated: 08/30/2015 Diana EvesBLACKBURN, Aireal N. ([id number]) Goal Status: Active Signs and symptoms of seizure will be recognized and promptly addressed ; seizing patients will suffer no harm Date Initiated: 08/30/2015 Goal Status: Active Interventions: Administer a five (5) minute air break for patient if signs and symptoms of seizure appear and notify the hyperbaric physician Administer a ten (10) minute air break for patient if signs and symptoms of seizure appear and notify the hyperbaric physician Administer decongestants, per physician orders, prior to HBO2 Administer the correct therapeutic gas delivery based on the patients needs and limitations, per physician order Assess and provide for patientos comfort related to the hyperbaric environment and equalization of middle ear Assess for signs and symptoms related to adverse events, including but not limited to confinement anxiety, pneumothorax, oxygen toxicity and baurotrauma Assess patient for any history of confinement anxiety Assess patient's knowledge and expectations regarding hyperbaric medicine and provide education related to the hyperbaric environment, goals of treatment and prevention of adverse events Implement protocols to decrease risk of pneumothorax in high risk patients Notes: Abuse / Safety / Falls / Self Care Management Nursing Diagnoses: Potential for falls Goals: Patient will remain injury free Date Initiated: 09/19/2015 Goal Status: Active Patient/caregiver will verbalize/demonstrate measures taken to prevent injury and/or falls Date Initiated: 09/19/2015 Goal Status: Active Interventions: Assess fall risk on admission and as needed Assess impairment of mobility on admission and as needed per policy Notes: Orientation to the Wound Care Program Nursing Diagnoses: Diana EvesBLACKBURN, Leslye  N. ([id number]) Knowledge deficit related to the wound healing center program Goals: Patient/caregiver will verbalize understanding of the Wound Healing Center Program Date Initiated: 08/30/2015 Goal Status: Active Interventions: Provide education on orientation to the wound center Notes: Pressure Nursing Diagnoses: Knowledge deficit related to causes and risk factors for pressure ulcer development Knowledge deficit related to management of pressures ulcers Potential for impaired tissue integrity related to pressure, friction, moisture, and shear Goals: Patient will remain free from development of additional pressure ulcers Date Initiated: 08/30/2015 Goal Status: Active Patient will remain free of pressure ulcers Date Initiated: 08/30/2015 Goal Status: Active Patient/caregiver will verbalize risk factors for pressure ulcer development Date Initiated: 08/30/2015 Goal Status: Active Patient/caregiver will verbalize understanding of pressure ulcer management Date Initiated: 08/30/2015 Goal Status: Active Interventions: Assess: immobility, friction, shearing, incontinence upon admission and as needed Assess offloading mechanisms upon admission and as needed Assess potential for pressure ulcer upon admission and as needed Provide education on pressure ulcers Treatment Activities: Patient referred for home evaluation of offloading devices/mattresses : 01/15/2016 Patient referred for pressure reduction/relief devices : 01/15/2016 Pressure reduction/relief device ordered : 01/15/2016 Notes: Diana EvesBLACKBURN, Jonni N. ([id number]) Wound/Skin Impairment Nursing Diagnoses: Impaired tissue integrity Knowledge deficit related to ulceration/compromised skin integrity Goals: Patient will have a decrease in wound volume by X% from date: (specify in notes) Date Initiated: 08/30/2015 Goal Status: Active Patient/caregiver will verbalize understanding of skin care regimen Date Initiated: 08/30/2015 Goal  Status: Active Ulcer/skin breakdown will have a volume reduction of 30% by week 4 Date Initiated: 08/30/2015 Goal Status: Active Ulcer/skin breakdown will have a volume reduction of 50% by week 8 Date Initiated: 08/30/2015 Goal Status: Active Ulcer/skin breakdown will have a volume reduction of 80% by week 12 Date Initiated: 08/30/2015 Goal Status: Active Ulcer/skin breakdown will heal within 14 weeks Date Initiated: 08/30/2015 Goal Status:  Active Interventions: Assess patient/caregiver ability to obtain necessary supplies Assess patient/caregiver ability to perform ulcer/skin care regimen upon admission and as needed Assess ulceration(s) every visit Provide education on ulcer and skin care Notes: Electronic Signature(s) Signed: 09/16/2016 5:10:32 PM By: [name] Entered By: [name] on 09/16/2016 16:25:19 [name] ([id number]) -------------------------------------------------------------------------------- Pain Assessment Details [name] Date of Service: 09/16/2016 3:45 PM Patient Name: N. Patient Account Number: [id number] Medical Record Treating RN: [name] [id number] Number: Other Clinician: 10/26/1980 (35 y.o. [name], [name] Date of Birth/Sex: Female) Physician/Extender: G Primary Care [name] Physician: Referring Physician: [name] in Treatment: 49 Active Problems Location of Pain Severity and Description of Pain Patient Has Paino No Site Locations Pain Management and Medication Current Pain Management: Electronic Signature(s) Signed: 09/16/2016 5:10:32 PM By: [name] Entered By: [name] on 09/16/2016 16:00:28 [name] ([id number]) -------------------------------------------------------------------------------- Patient/Caregiver Education Details [name] Date of Service: 09/16/2016 3:45 PM Patient Name: N. Patient Account Number: [id number] Medical Record  Treating RN: [name] [id number] Number: Other Clinician: Jul 27, 1981 (35 y.o. [name], [name] Date of Birth/Gender: Female) Physician/Extender: G Primary Care Weeks in Treatment: 7 [name] Physician: Referring Physician: [name] Education Assessment Education Provided To: Patient Education Topics Provided Wound/Skin Impairment: Handouts: Other: change dressing as ordered Methods: Demonstration, Explain/Verbal Responses: State content correctly Electronic Signature(s) Signed: 09/16/2016 5:10:32 PM By: [name] Entered By: [name] on 09/16/2016 16:39:59 [name] ([id number]) -------------------------------------------------------------------------------- Wound Assessment Details [name] Date of Service: 09/16/2016 3:45 PM Patient Name: N. Patient Account Number: [id number] Medical Record Treating RN: [name] [id number] Number: Other Clinician: 14-Feb-1981 (35 y.o. [name], [name] Date of Birth/Sex: Female) Physician/Extender: G Primary Care [name] Physician: Referring Physician: [name] in Treatment: 47 Wound Status Wound Number: 16 Primary Pressure Ulcer Etiology: Wound Location: Right Amputation Site - Below Knee - Posterior Wound Open Status: Wounding Event: Pressure Injury Comorbid Anemia, Type I Diabetes, End Stage Date Acquired: 04/02/2016 History: Renal Disease, Rheumatoid Arthritis, Weeks Of Treatment: 23 Neuropathy Clustered Wound: No Photos Photo Uploaded By: Elliot Gurney, RN, [name], Kim on 09/16/2016 16:22:45 Wound Measurements Length: (cm) 4.5 Width: (cm) 4.3 Depth: (cm) 0.5 Area: (cm) 15.197 Volume: (cm) 7.599 % Reduction in Area: -155.9% % Reduction in Volume: -1179.3% Epithelialization: None Tunneling: No Undermining: Yes Starting Position (o'clock): 4 Ending Position (o'clock): 7 Maximum Distance: (cm) 0.6 Wound Description Classification:  Category/Stage III Diabetic Severity (Wagner): Grade 1 Wound Margin: Distinct, outline attache Exudate Amount: Large Exudate Type: Purulent Bonaventure, Kattie N. ([id number]) Foul Odor After Cleansing: No d Exudate Color: yellow, brown, green Wound Bed Granulation Amount: None Present (0%) Exposed Structure Necrotic Amount: Large (67-100%) Fat Layer Exposed: Yes Necrotic Quality: Eschar, Adherent Slough Periwound Skin Texture Texture Color No Abnormalities Noted: No No Abnormalities Noted: No Callus: No Atrophie Blanche: No Crepitus: No Cyanosis: No Excoriation: No Ecchymosis: No Fluctuance: No Erythema: Yes Friable: No Erythema Location: Circumferential Induration: Yes Erythema Change: No Change Localized Edema: Yes Hemosiderin Staining: No Rash: No Mottled: No Scarring: No Pallor: No Rubor: No Moisture No Abnormalities Noted: No Temperature / Pain Dry / Scaly: No Temperature: No Abnormality Maceration: No Tenderness on Palpation: Yes Moist: Yes Wound Preparation Ulcer Cleansing: Rinsed/Irrigated with Saline Topical Anesthetic Applied: None, Other: lidocaine 4%, Treatment Notes 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 Cleansed with: Clean wound with Normal Saline 2. Anesthetic Topical Lidocaine 4% cream to wound bed prior to debridement 4. Dressing Applied: Aquacel Ag 5. Secondary  Dressing Applied Bordered Foam Dressing Electronic Signature(s) Signed: 09/16/2016 5:10:32 PM By: [name] Entered By: [name] on 09/16/2016 16:08:17 [name] ([id number]) -------------------------------------------------------------------------------- Wound Assessment Details [name] Date of Service: 09/16/2016 3:45 PM Patient Name: N. Patient Account Number: [id number] Medical Record Treating RN: [name] [id number] Number: Other Clinician: [birth year] (35 y.o. [name], [name] Date of  Birth/Sex: Female) Physician/Extender: G Primary Care [name] Physician: Referring Physician: [name] in Treatment: 76 Wound Status Wound Number: 17 Primary Open Surgical Wound Etiology: Wound Location: Left Amputation Site - Transmetatarsal Wound Open Status: Wounding Event: Surgical Injury Comorbid Anemia, Type I Diabetes, End Stage Date Acquired: 04/13/2016 History: Renal Disease, Rheumatoid Arthritis, Weeks Of Treatment: 20 Neuropathy Clustered Wound: No Photos Photo Uploaded By: Elliot Gurney, RN, [name], Kim on 09/16/2016 16:22:45 Wound Measurements Length: (cm) 1 Width: (cm) 0.6 Depth: (cm) 0.2 Area: (cm) 0.471 Volume: (cm) 0.094 % Reduction in Area: 43.9% % Reduction in Volume: -11.9% Epithelialization: None Tunneling: No Undermining: No Wound Description Full Thickness Without Foul Odor After Classification: Exposed Support Structures Diabetic Severity Grade 1 (Wagner): Wound Margin: Flat and Intact Exudate Amount: Large Exudate Type: Serous Exudate Color: amber Blocher, Jahzara N. ([id number]) Cleansing: No Wound Bed Granulation Amount: None Present (0%) Exposed Structure Necrotic Amount: Medium (34-66%) Fascia Exposed: No Necrotic Quality: Adherent Slough Fat Layer Exposed: Yes Tendon Exposed: No Muscle Exposed: No Joint Exposed: No Bone Exposed: No Periwound Skin Texture Texture Color No Abnormalities Noted: No No Abnormalities Noted: No Callus: No Atrophie Blanche: No Crepitus: No Cyanosis: No Excoriation: No Ecchymosis: Yes Fluctuance: No Erythema: No Friable: No Hemosiderin Staining: No Induration: No Mottled: No Localized Edema: No Pallor: No Rash: No Rubor: No Scarring: No Moisture No Abnormalities Noted: No Dry / Scaly: No Maceration: No Moist: Yes Wound Preparation Ulcer Cleansing: Rinsed/Irrigated with Saline Topical Anesthetic Applied: Other: lidocaine 4%, Treatment Notes Wound #17 (Left Amputation  Site - Transmetatarsal) 1. Cleansed with: Clean wound with Normal Saline 2. Anesthetic Topical Lidocaine 4% cream to wound bed prior to debridement 4. Dressing Applied: Prisma Ag 5. Secondary Dressing Applied Bordered Foam Dressing Kerlix/Conform 7. Secured with Secretary/administrator) Signed: 09/16/2016 5:10:32 PM By: Donella Stade, Ledell Peoples ([id number]) Entered By: [name] on 09/16/2016 16:12:17 [name] ([id number]) -------------------------------------------------------------------------------- Wound Assessment Details [name] Date of Service: 09/16/2016 3:45 PM Patient Name: N. Patient Account Number: [id number] Medical Record Treating RN: [name] [id number] Number: Other Clinician: 1980/12/19 (35 y.o. [name], [name] Date of Birth/Sex: Female) Physician/Extender: G Primary Care [name] Physician: Referring Physician: [name] in Treatment: 61 Wound Status Wound Number: 18 Primary Pressure Ulcer Etiology: Wound Location: Left Ischial Tuberosity Wound Open Wounding Event: Pressure Injury Status: Date Acquired: 05/12/2016 Comorbid Anemia, Type I Diabetes, End Stage Weeks Of Treatment: 15 History: Renal Disease, Rheumatoid Arthritis, Clustered Wound: No Neuropathy Photos Photo Uploaded By: Elliot Gurney, RN, [name], Kim on 09/16/2016 16:24:00 Wound Measurements Length: (cm) 2.5 % Reduction in Ar Width: (cm) 2 % Reduction in Vo Depth: (cm) 0.4 Epithelialization Area: (cm) 3.927 Tunneling: Volume: (cm) 1.571 Position (o'c Maximum Distan ea: 70.8% lume: -17% : Small (1-33%) Yes lock): 7 ce: (cm) 1.8 Undermining: No Wound Description Classification: Category/Stage III Wound Margin: Flat and Intact Exudate Amount: Large Exudate Type: Serosanguineous Exudate Color: red, brown Wound Bed Pompei, Donnajean N. ([id number]) Granulation Amount: Large (67-100%) Exposed  Structure Granulation Quality: Pink Fascia Exposed: No Necrotic Amount: Small (1-33%) Fat Layer Exposed: No Necrotic Quality: Adherent Slough Tendon Exposed: No  Muscle Exposed: No Joint Exposed: No Bone Exposed: No Limited to Skin Breakdown Periwound Skin Texture Texture Color No Abnormalities Noted: No No Abnormalities Noted: No Callus: No Atrophie Blanche: No Crepitus: No Cyanosis: No Excoriation: No Ecchymosis: No Fluctuance: No Erythema: Yes Friable: No Erythema Location: Circumferential Induration: No Hemosiderin Staining: No Localized Edema: No Mottled: No Rash: No Pallor: No Scarring: No Rubor: No Moisture No Abnormalities Noted: No Dry / Scaly: No Maceration: No Moist: Yes Wound Preparation Ulcer Cleansing: Rinsed/Irrigated with Saline Topical Anesthetic Applied: Other: lidocaine 4%, Treatment Notes Wound #18 (Left Ischial Tuberosity) 1. Cleansed with: Clean wound with Normal Saline 2. Anesthetic Topical Lidocaine 4% cream to wound bed prior to debridement 4. Dressing Applied: Prisma Ag 5. Secondary Dressing Applied Bordered Foam Dressing Electronic Signature(s) Signed: 09/16/2016 5:10:32 PM By: [name] Entered By: [name] on 09/16/2016 16:01:30 [name] ([id number]) Simeon Craft, Ledell Peoples ([id number]) -------------------------------------------------------------------------------- Wound Assessment Details [name] Date of Service: 09/16/2016 3:45 PM Patient Name: N. Patient Account Number: [id number] Medical Record Treating RN: [name] [id number] Number: Other Clinician: November 13, 1980 (35 y.o. [name], [name] Date of Birth/Sex: Female) Physician/Extender: G Primary Care [name] Physician: Referring Physician: [name] in Treatment: 54 Wound Status Wound Number: 20 Primary Pressure Ulcer Etiology: Wound Location: Left Foot - Dorsal Wound Open Wounding Event: Pressure  Injury Status: Date Acquired: 08/06/2016 Comorbid Anemia, Type I Diabetes, End Stage Weeks Of Treatment: 1 History: Renal Disease, Rheumatoid Arthritis, Clustered Wound: No Neuropathy Photos Photo Uploaded By: Elliot Gurney, RN, [name], Kim on 09/16/2016 16:24:00 Wound Measurements Length: (cm) 3.3 Width: (cm) 3.4 Depth: (cm) 0.1 Area: (cm) 8.812 Volume: (cm) 0.881 % Reduction in Area: -419.6% % Reduction in Volume: -418.2% Epithelialization: None Tunneling: No Undermining: No Wound Description Classification: Unstageable/Unclassified Foul Od Diabetic Severity (Wagner): Grade 2 Wound Margin: Flat and Intact Exudate Amount: None Present or After Cleansing: No Wound Bed Granulation Amount: None Present (0%) Exposed Structure Necrotic Amount: Large (67-100%) Fascia Exposed: No Necrotic Quality: Eschar Fat Layer Exposed: No Notte, Daizha N. ([id number]) Tendon Exposed: No Muscle Exposed: No Joint Exposed: No Bone Exposed: No Limited to Skin Breakdown Periwound Skin Texture Texture Color No Abnormalities Noted: No No Abnormalities Noted: No Moisture No Abnormalities Noted: No Dry / Scaly: Yes Wound Preparation Ulcer Cleansing: Rinsed/Irrigated with Saline Topical Anesthetic Applied: Other: lidocaine 4%, Treatment Notes Wound #20 (Left, Dorsal Foot) 1. Cleansed with: Clean wound with Normal Saline 2. Anesthetic Topical Lidocaine 4% cream to wound bed prior to debridement 4. Dressing Applied: Prisma Ag 5. Secondary Dressing Applied Bordered Foam Dressing Kerlix/Conform 7. Secured with Secretary/administrator) Signed: 09/16/2016 5:10:32 PM By: [name] Entered By: [name] on 09/16/2016 16:15:37 [name] ([id number]) -------------------------------------------------------------------------------- Wound Assessment Details [name] Date of Service: 09/16/2016 3:45 PM Patient Name: N. Patient Account Number:  [id number] Medical Record Treating RN: [name] [id number] Number: Other Clinician: 07/05/1981 (35 y.o. [name], [name] Date of Birth/Sex: Female) Physician/Extender: G Primary Care [name] Physician: Referring Physician: [name] in Treatment: 40 Wound Status Wound Number: 21 Primary Pressure Ulcer Etiology: Wound Location: Left Foot - Lateral Wound Open Wounding Event: Pressure Injury Status: Date Acquired: 08/06/2016 Comorbid Anemia, Type I Diabetes, End Stage Weeks Of Treatment: 1 History: Renal Disease, Rheumatoid Arthritis, Clustered Wound: No Neuropathy Photos Photo Uploaded By: Elliot Gurney, RN, [name], Kim on 09/16/2016 16:27:14 Wound Measurements Length: (cm) 2.2 Width: (cm) 1.1 Depth: (cm) 0.1 Area: (cm) 1.901 Volume: (cm) 0.19 % Reduction in Area: -21% % Reduction  in Volume: -21% Epithelialization: None Tunneling: No Undermining: No Wound Description Classification: Unstageable/Unclassified Foul Od Diabetic Severity (Wagner): Grade 1 Wound Margin: Flat and Intact Exudate Amount: None Present or After Cleansing: No Wound Bed Granulation Amount: None Present (0%) Exposed Structure Necrotic Amount: Large (67-100%) Fascia Exposed: No Necrotic Quality: Eschar Fat Layer Exposed: No Segel, Sammy N. ([id number]) Tendon Exposed: No Muscle Exposed: No Joint Exposed: No Bone Exposed: No Limited to Skin Breakdown Periwound Skin Texture Texture Color No Abnormalities Noted: No No Abnormalities Noted: No Callus: No Atrophie Blanche: No Crepitus: No Cyanosis: No Excoriation: No Ecchymosis: Yes Fluctuance: No Erythema: No Friable: No Hemosiderin Staining: No Induration: Yes Mottled: No Localized Edema: No Pallor: No Rash: No Rubor: No Scarring: No Moisture No Abnormalities Noted: No Dry / Scaly: No Maceration: No Moist: No Wound Preparation Ulcer Cleansing: Rinsed/Irrigated with Saline Topical Anesthetic  Applied: None Treatment Notes Wound #21 (Left, Lateral Foot) 1. Cleansed with: Clean wound with Normal Saline 2. Anesthetic Topical Lidocaine 4% cream to wound bed prior to debridement 4. Dressing Applied: Prisma Ag 5. Secondary Dressing Applied Bordered Foam Dressing Kerlix/Conform 7. Secured with Secretary/administrator) Signed: 09/16/2016 5:10:32 PM By: [name] Entered By: [name] on 09/16/2016 16:11:18 [name] ([id number]) Simeon Craft, Ledell Peoples ([id number]) -------------------------------------------------------------------------------- Wound Assessment Details [name] Date of Service: 09/16/2016 3:45 PM Patient Name: N. Patient Account Number: [id number] Medical Record Treating RN: [name] [id number] Number: Other Clinician: [birth year] (35 y.o. [name], [name] Date of Birth/Sex: Female) Physician/Extender: G Primary Care [name] Physician: Referring Physician: [name] in Treatment: 55 Wound Status Wound Number: 22 Primary Pressure Ulcer Etiology: Wound Location: Right Ischial Tuberosity Wound Open Wounding Event: Pressure Injury Status: Date Acquired: 09/07/2016 Comorbid Anemia, Type I Diabetes, End Stage Weeks Of Treatment: 1 History: Renal Disease, Rheumatoid Arthritis, Clustered Wound: No Neuropathy Photos Photo Uploaded By: Elliot Gurney, RN, [name], Kim on 09/16/2016 16:27:14 Wound Measurements Length: (cm) 1.4 % Reduction in Ar Width: (cm) 2.3 % Reduction in Vo Depth: (cm) 0.1 Epithelialization Area: (cm) 2.529 Tunneling: Volume: (cm) 0.253 Undermining: ea: -67.7% lume: -67.5% : None No No Wound Description Classification: Category/Stage II Foul Odor After Wound Margin: Flat and Intact Exudate Amount: Medium Exudate Type: Serous Exudate Color: amber Cleansing: No Wound Bed Granulation Amount: None Present (0%) Exposed Structure Necrotic Amount: Large (67-100%) Fascia  Exposed: No Wichmann, Haleemah N. ([id number]) Necrotic Quality: Adherent Slough Fat Layer Exposed: No Tendon Exposed: No Muscle Exposed: No Joint Exposed: No Bone Exposed: No Limited to Skin Breakdown Periwound Skin Texture Texture Color No Abnormalities Noted: No No Abnormalities Noted: No Callus: No Atrophie Blanche: No Crepitus: No Cyanosis: No Excoriation: No Ecchymosis: Yes Fluctuance: No Erythema: No Friable: No Hemosiderin Staining: No Induration: No Mottled: No Localized Edema: No Pallor: No Rash: No Rubor: No Scarring: No Moisture No Abnormalities Noted: No Dry / Scaly: No Maceration: No Moist: No Wound Preparation Ulcer Cleansing: Rinsed/Irrigated with Saline Topical Anesthetic Applied: None Treatment Notes Wound #22 (Right Ischial Tuberosity) 1. Cleansed with: Clean wound with Normal Saline 2. Anesthetic Topical Lidocaine 4% cream to wound bed prior to debridement 4. Dressing Applied: Prisma Ag 5. Secondary Dressing Applied Bordered Foam Dressing Electronic Signature(s) Signed: 09/16/2016 5:10:32 PM By: [name] Entered By: [name] on 09/16/2016 16:06:39 [name] ([id number]) -------------------------------------------------------------------------------- Wound Assessment Details [name] Date of Service: 09/16/2016 3:45 PM Patient Name: N. Patient Account Number: [id number] Medical Record Treating RN: [name] [id number] Number: Other Clinician: Sep 02, 1981 (35 y.o. [name],  [name] Date of Birth/Sex: Female) Physician/Extender: G Primary Care [name] Physician: Referring Physician: [name] in Treatment: 40 Wound Status Wound Number: 9 Primary Diabetic Wound/Ulcer of the Lower Etiology: Extremity Wound Location: Left Calcaneous Wound Open Wounding Event: Pressure Injury Status: Date Acquired: 07/28/2015 Comorbid Anemia, Type I Diabetes, End Stage Weeks Of  Treatment: 54 History: Renal Disease, Rheumatoid Arthritis, Clustered Wound: No Neuropathy Photos Photo Uploaded By: Elliot Gurney, RN, [name], Kim on 09/16/2016 16:27:55 Wound Measurements Length: (cm) 3 Width: (cm) 2.3 Depth: (cm) 0.1 Area: (cm) 5.419 Volume: (cm) 0.542 % Reduction in Area: 70.8% % Reduction in Volume: 70.8% Epithelialization: None Tunneling: No Undermining: No Wound Description Classification: Grade 2 Foul Odor After Wound Margin: Flat and Intact Exudate Amount: Large Exudate Type: Serosanguineous Exudate Color: red, brown Cleansing: No Wound Bed Granulation Amount: Medium (34-66%) Exposed Structure Granulation Quality: Red Fascia Exposed: No Mozer, Jillyn N. ([id number]) Necrotic Amount: Medium (34-66%) Fat Layer Exposed: Yes Necrotic Quality: Eschar, Adherent Slough Tendon Exposed: No Muscle Exposed: No Joint Exposed: No Bone Exposed: No Periwound Skin Texture Texture Color No Abnormalities Noted: No No Abnormalities Noted: No Callus: No Atrophie Blanche: No Crepitus: No Cyanosis: No Excoriation: No Ecchymosis: No Fluctuance: No Erythema: No Friable: No Hemosiderin Staining: No Induration: No Mottled: No Localized Edema: No Pallor: No Rash: No Rubor: No Scarring: No Moisture No Abnormalities Noted: No Dry / Scaly: No Maceration: No Moist: Yes Wound Preparation Ulcer Cleansing: Rinsed/Irrigated with Saline Topical Anesthetic Applied: Other: lidocaine 4%, Assessment Notes large amount of dressing stuck to wound area. Treatment Notes Wound #9 (Left Calcaneous) 1. Cleansed with: Clean wound with Normal Saline 2. Anesthetic Topical Lidocaine 4% cream to wound bed prior to debridement 4. Dressing Applied: Prisma Ag 5. Secondary Dressing Applied Bordered Foam Dressing Kerlix/Conform 7. Secured with Secretary/administrator) Signed: 09/16/2016 5:10:32 PM By: Donella Stade, Ledell Peoples ([id number]) Entered By:  [name] on 09/16/2016 16:10:28 [name] ([id number]) -------------------------------------------------------------------------------- Vitals Details [name] Date of Service: 09/16/2016 3:45 PM Patient Name: N. Patient Account Number: [id number] Medical Record Treating RN: [name] [id number] Number: Other Clinician: [birth year] (35 y.o. [name], [name] Date of Birth/Sex: Female) Physician/Extender: G Primary Care [name] Physician: Referring Physician: [name] in Treatment: 59 Vital Signs Time Taken: 16:00 Temperature (F): 98.2 Height (in): 68 Pulse (bpm): 82 Weight (lbs): 96 Respiratory Rate (breaths/min): 14 Body Mass Index (BMI): 14.6 Blood Pressure (mmHg): 105/48 Reference Range: 80 - 120 mg / dl Electronic Signature(s) Signed: 09/16/2016 5:10:32 PM By: [name] Entered By: [name] on 09/16/2016 16:00:48

## 2016-09-22 ENCOUNTER — Ambulatory Visit: Payer: Medicare Other | Admitting: Internal Medicine

## 2016-09-23 ENCOUNTER — Ambulatory Visit: Payer: Medicare Other | Admitting: Internal Medicine

## 2016-09-29 NOTE — Progress Notes (Signed)
[name] ([id number]) Visit Report for 09/16/2016 Chief Complaint Document Details [name] Date of Service: 09/16/2016 3:45 PM Patient Name: N. Patient Account Number: [id number] Medical Record Treating RN: [name] [id number] Number: Other Clinician: Nov 30, 1980 (36 y.o. Treating [name], Wu Date of Birth/Sex: Female) Physician/Extender: G Primary Care [name] Physician: Referring Physician: [name] Wu in Treatment: 71 Information Obtained from: Patient Chief Complaint [name] returns for evaluation of multiple ulcerations Electronic Signature(s) Signed: 09/16/2016 5:40:41 PM By: [name] Najjar MD Entered By: [name] on 09/16/2016 17:29:44 [name] ([id number]) -------------------------------------------------------------------------------- Debridement Details [name] Date of Service: 09/16/2016 3:45 PM Patient Name: N. Patient Account Number: [id number] Medical Record Treating RN: [name] [id number] Number: Other Clinician: June 27, 1981 (36 y.o. Treating [name], Wu Date of Birth/Sex: Female) Physician/Extender: G Primary Care [name] Physician: Referring Physician: [name] Wu in Treatment: 16 Debridement Performed for Wound #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 Assessment: Performed By: Physician [name] Caul, MD Debridement: Debridement Pre-procedure Yes - 16:29 Verification/Time Out Taken: Start Time: 16:32 Pain Control: Lidocaine 4% Topical Solution Level: Skin/Subcutaneous Tissue Total Area Debrided (L x 1 (cm) x 0.6 (cm) = 0.6 (cm) W): Tissue and other Viable, Non-Viable, Exudate, Fibrin/Slough, Subcutaneous material debrided: Instrument: Curette Bleeding: Minimum Hemostasis Achieved: Pressure End Time: 16:34 Procedural Pain: 0 Post Procedural Pain: 0 Response to Treatment: Procedure was tolerated well Post Debridement  Measurements of Total Wound Length: (cm) 1 Width: (cm) 0.6 Depth: (cm) 0.2 Volume: (cm) 0.094 Character of Wound/Ulcer Post Requires Further Debridement Debridement: Severity of Tissue Post Debridement: Fat layer exposed Post Procedure Diagnosis Same as Pre-procedure Electronic Signature(s) [name] ([id number]) Signed: 09/16/2016 5:40:41 PM By: [name] Najjar MD Signed: 09/29/2016 2:01:11 PM By: [name] Previous Signature: 09/16/2016 5:10:32 PM Version By: [name] Entered By: [name] on 09/16/2016 17:29:21 [name] ([id number]) -------------------------------------------------------------------------------- Debridement Details [name] Date of Service: 09/16/2016 3:45 PM Patient Name: N. Patient Account Number: [id number] Medical Record Treating RN: [name] [id number] Number: Other Clinician: Jul 06, 1981 (36 y.o. Treating [name], Wu Date of Birth/Sex: Female) Physician/Extender: G Primary Care [name] Physician: Referring Physician: [name] Wu in Treatment: 30 Debridement Performed for Wound #21 Left,Lateral Foot Assessment: Performed By: Physician [name] Caul, MD Debridement: Debridement Pre-procedure Yes - 16:29 Verification/Time Out Taken: Start Time: 16:30 Pain Control: Lidocaine 4% Topical Solution Level: Skin/Subcutaneous Tissue Total Area Debrided (L x 2.2 (cm) x 1.1 (cm) = 2.42 (cm) W): Tissue and other Viable, Non-Viable, Eschar, Exudate, Fibrin/Slough, Subcutaneous material debrided: Instrument: Curette Bleeding: Minimum Hemostasis Achieved: Pressure End Time: 16:32 Procedural Pain: 0 Post Procedural Pain: 0 Response to Treatment: Procedure was tolerated well Post Debridement Measurements of Total Wound Length: (cm) 2.2 Stage: Unstageable/Unclassified Width: (cm) 1.1 Depth: (cm) 0.1 Volume: (cm) 0.19 Character of Wound/Ulcer Post Requires  Further Debridement: Debridement Severity of Tissue Post Fat layer exposed Debridement: Post Procedure Diagnosis Same as Pre-procedure [name], [name] ([id number]) Electronic Signature(s) Signed: 09/16/2016 5:40:41 PM By: [name] Najjar MD Signed: 09/29/2016 2:01:11 PM By: [name] Previous Signature: 09/16/2016 5:10:32 PM Version By: [name] Entered By: [name] on 09/16/2016 17:29:35 [name] ([id number]) -------------------------------------------------------------------------------- HPI Details [name] Date of Service: 09/16/2016 3:45 PM Patient Name: N. Patient Account Number: [id number] Medical Record Treating RN: [name] [id number] Number: Other Clinician: Jan 28, 1981 (36 y.o. Treating [name], Wu Date of Birth/Sex: Female) Physician/Extender: G Primary Care [name] Physician: Referring Physician: [name] Wu in Treatment: 18 History of Present Illness Location: dry  gangrene both feet and heels Quality: Patient reports No Pain. Severity: Patient states wound are getting worse. Duration: Patient has had the wound for > 4months prior to seeking treatment at the wound center Context: The wound appeared gradually over time Modifying Factors: she has been in and out of hospital over the last 2 months Associated Signs and Symptoms: Patient reports having difficulty standing for long periods. HPI Description: [name] is a 36 y.o. female who presents to our wound center, back in June 2016, referred by her PCP Dr. Zada Wu for nonhealing ulcers on the lateral aspect of the right heel. Of note she has a history of type 1 diabetes mellitus that has been uncontrolled. Past medical history significant for type 1 diabetes mellitus not controlled, ankylosing spondylitis, anorexia nervosa, irritable bowel syndrome, chronic kidney disease, chronic diarrhea. she then developed gangrene of both feet due to  severe peripheral vascular disease and also had gangrene of the tips of her fingers due to upper extremity vascular disease. She was being worked up by vascular surgery at [facility] and at [facility] and has had several procedures done there. She started with hyperbaric oxygen therapy and had a total of 40 treatments the last one being on 06/20/2015. After the initial treatment of hyperbaric oxygen therapy she started having ear problems and had ultimately to use myringotomy tubes and this was done bilaterally. Since then her ears have been doing fine. In late September, she had seen vascular and hand surgeons. since then she's been in [place] at the rec center for surgery involving extensive vascular procedures for the upper extremities. She was then at [facility] with GI bleeds both upper and lower and has been in and out of hospital for that. She has recently been out of hospital for the last week. 09/09/2015 -- she was unable to get here in time to start her hyperbaric oxygen today and hence is only here for a wound care visit. 09/19/2015 -- she has been having vancomycin during her dialysis and continues to have vascular appointments and the procedure is been set for early January. She has been unable to make it for her hemodialysis due to various medical symptoms. 09/30/2014 -- her vancomycin was stopped on 09/25/2015 and the mother has noticed the right foot has started draining for the last 3 days. Addendum: after examining the patient I was able to talk to her primary vascular surgeon Dr. Pernell Dupre at the [facility]. I told him about the necrotic area on the plantar aspect of right foot which is now wet gangrene and he agreed with me that he would admit her at [facility] under her care and synchronize further treatment. We have discussed her poor prognosis and he and I discussed the need for hospice care [facility], [name] ([id number]) and for sitting down and talking to the patient and her  mother and giving them a proper detail of the prognosis. 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 She was worked up with MRIs, abdomen aortogram and placement of left-sided angioplasties were done. After a prolonged hospital she she was discharged home and was told to wear shrinker sock and stump  protector and see her surgeon for further instructions regarding wound care and suture removal. Asked to take long-term doxycycline. 01/15/16; this is a patient I haven't seen before although she is been followed by Dr. Meyer Russel in this clinic today. She is a type I diabetic with severe PAD macrovascular disease. She has had a previous BKA. She has dry gangrene of the tips of her fingers which she showed me on the right to. She also has dry gangrene of the left first second and third toes and a portion of her proximal foot around these areas. She is followed by vascular surgery at [facility] and saw them recently they are not going to do surgery as of yet. She has a large black eschar over her heel which is beginning to separate in some areas. As I understand think she is paining these with Betadine. There is been some suggestion about retrying hyperbarics on her although she is still not able to commit to the frequency of treatment that would be necessary to see improvement. She is also on Monday Wednesday Friday dialysis 01/21/16; the patient returns to see me today with regards to the left heel. Apparently she is not scheduled for any further attempts at revascularization of the left foot. She has dry gangrene of the  left medial foot, first and second toes and there is already some separation developing here. 01/28/16; the patient returns today for attention to the left foot specifically the left calcaneal ulcer. This is covered in a thick black eschar. I crosshatched this last week and we have been using Santyl. 02/05/16; I continue to work on the thick black eschar on the patient's left calcaneus. She is using Santyl that this side crosshatched this area and the eschar is beginning to loosen. 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 This is beginning to separate and liquefy as well especially between the first and second toes. In the time being her major complaint is fatigue at dialysis 02/26/16 I continue to work on the patient's left calcaneus thick adherent black eschar. I crosshatched this area and we have been using Santyl although it is very adherent area I remove some nonviable tissue today what I can see of this actually looks surprisingly good. On the same foot she has dry gangrene on the first and second toes and part of the forefoot underneath this. This is beginning to separate. 03/11/16; the patient comes in for her every 2 week appointment. I have been working on the black eschar on her heel. The patient apparently again has to come off dialysis early today after 2 hours due to severe complaints of nausea. She really does not look well. 04/02/2016 -- the patient has not been seen here for about 3 weeks now and has a new issue with the stump of the right amputation site and also her right posterior thigh. They have only noticed this for the last couple of days. 04/28/16; I had received a call from the patient's surgeon at [facility]. She had a left transmetatarsal amputation and Integra applied to the left heel. Both of these areas appear to be doing well. There are dressing  these with [employer] and they will return to their surgeon on Thursday. She has a new injury on the popliteal fossa on the right which I think was trauma from her stump. 05/20/16; the patient is  following with her surgeon at [facility]. She's had a left transmetatarsal debridement of the left heel she had Integra place and apparently is using some consternation of Epson salts soaks, Betadine and Aquacel Ag. The left leg is wrapped I did not look at this today. She has the wound in her right popliteal fossa which is apparently a pressure area possibly related to sitting on a toilet for 2-3 hours multiple times a day. 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 We have been using Santyl to the area on the right popliteal fossa. She has Gragert, Indiya N. ([id number]) a new wound on her right gluteal area but the patient would not let me look at that today 06/03/16; patient is not doing particularly well. She now has a pressure area over her left ischial tuberosity. This is of quite a size and covered with an adherent surface slough. She looks as though she has lost weight, I didn't want to go ahead and attempt to debridement this today. There is really no evidence of infection. The area behind the right popliteal fossa looks about the same as 2 weeks ago we have been using Santyl to this area. I did not look at her left foot which is wrapped been followed by podiatry at [facility] 06/10/16; patient arrives in clinic actually looking a lot brighter than I usually see her, predictably she did not go to dialysis today. For the first time in perhaps 6 weeks I actually saw her left foot today. The transmetatarsal site is healed. The left heel has a reasonably stable-looking wound which has a clean base. Some eschar superiorly and a few sutures remain in place. More worrisome is an area on her lateral left foot over the metatarsal head.  Quarter size necrotic wound that probes to bone. There is some purulence here which I cultured there is nothing that looks like a healthy base of this area. They've been using silver alginate to this area at home. She also has a large wound in the right popliteal fossa, and again a pressure area over the left ischial tuberosity. We are using Santyl to both of these areas. Both allays looks somewhat better than last week, using Santyl to both areas 06/17/16: culture from last week grew citrobacter and amp sensitive enterococcus fecalis. Started on vanc last Saturday at dialysis. have spoken to dialysis in [place] re adjustment in antibiotics added ceftazidine to vanc 06/24/16; the patient was discharged from [facility] yesterday. She had an IandD of the open area on the lateral aspect of her foot. She continues on vancomycin and Ceptaz again at dialysis although I'm not exactly sure of the current duration of this. She also had a debridement of the wound over the left greater trochanter done by the wound care team. She is receiving Santyl based dressings to this and the area in her right popliteal fossa. She arrives today completely fatigued from dialysis. I spoke to Dr. Allena Katz her podiatrist and surgeon at [facility] and asked if we could manage the wound VAC which I think we can. He also wanted to ask about hyperbaric oxygen with the indication of chronic osteomyelitis although at this point I am not completely certain where the chronic osteomyelitis is. Finally she apparently has had a re-graft to the area on her left heel which is either a skin graft or Integra 07/15/16; patient arrives today she is not been in the clinic since I saw her on  9/27. She's been using Santyl to the left greater trochanter, I popliteal fossa. Her home health nurse remove the wound VAC from the transmetatarsal head, there is only a small wound on the medial aspect of the foot. Dr. Allena Katz who is been managing the heel wounds  has requested Xeroform to the heel. 07/22/16; patient arrives as usual postdialysis she is very fatigued and weak looking. She generally tolerates dialysis poorly requiring midodrine tosupport her blood pressure. She continues on vancomycin and I believe ceftazadine at dialysis for chronic osteomyelitis oShe has a wound on the lateral left transmetatarsal amputation. This was debrided of surface slough nonviable subcutaneous tissue the base of this looks healthy and improved. oHer left heel wound is followed by by Dr. Allena Katz at [facility] her surgeon. We have been applying Xeroform to this at his request, he'll oShe has a deep wound over her left greater trochanter however this does not appear to be infected has healthy granulation and I think could be well served by a wound VAC with collagen under the foam oUnder the right popliteal fossa there is a pressure area apparently from a toilet seat. This has been making progress again the tissue here appears to be healthy we have been using silver collagen in this area as well oThere is a new small area on the lateral aspect of her right stump which appears to be draining purulent material. Our nurses obtained a specimen of this for culture. As noted she is already onVAncomycin e a third generation cephalosporin 08/05/16 -Ms. Rabadan arrives today accompanied by her aunt and cousin, she did not receive dialysis today due to "feeling bad" and is scheduled to received dialysis tomorrow. She has completed vancomycin that she was receiving with dialysis. She has an appointment with podiatry on Friday; admits to using right prosthesis for transfers primarily [place], CIONNA COLLANTES. ([id number]) -She has a wound on the lateral left TMA site, essentially unchanged and stable; staples remain in medial aspect of healed surgical site from August, patient states that the plan is to remove staples on Friday -Left heel ulcer appears stable with granulation tissue -stage  III left trochanter pressure ulcer with peri-wound irritation, appears to be from leakage despite patient stating that VAC has not leaked, has been using Prisma and wound VAC -Right popliteal fossa pressure ulcer is stable -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 and pale erythema to lateral aspect, improved from last week, although purulence able to be expressed, will encourage warm compresses to aide in bring fluid to surface - new areas of DTI to left foot dorsal and lateral aspect; patient admits that foot has been wrapped with ace per podiatry, she also wears surgical shoe to left foot; dorsal DTI has intact blister; dorsal DTI not present at last appointment, lateral DTI present but significantly smaller at last appointment; both areas (dorsal>lateral) concerning for threatening limb loss, new to size and location - she has seen "limb loss specialist" at [facility] in the past and was encouraged to follow up with that provider in presence of new development 09/08/16 I have not seen this patient in quite some time perhaps late October. She was seen once here at the beginning of November however her appointments are scheduled after dialysis at which time she doesn't feel well enough to come. Also noted recently our staff of taken several phone calls from her home health nurse that been concerned about worsening wounds on the back of the right knee left greater trochanter. We brought her  in today to go over this. As far as I can tell the wounds are as follows; #1 pressure ulcer on the back of her right knee in the popliteal fossa. As best I can tell this is caused by prolonged sitting on the toilet seat related to ongoing issues with profuse, high-volume diarrhea #2 pressure ulcer over the left greater trochanter. We have been using a wound VAC on this area #3 probing ulcer on the medial aspect of her left transmetatarsal amputation #4 pressure ulcer on the left posterior calcaneus #5 excoriation  on the lateral aspect of her left foot which is not clearly open but was concerning last time she was here for a deep tissue injury. She is off antibiotics at dialysis since the last time I saw her. This was for underlying osteomyelitis. 09/16/16; she arrives today with the pressure ulcer on the back of her right knee still draining and looking somewhat worse. Culture I did last week grew Klebsiella and enterococcus which is ampicillin sensitive. I therefore have given her prescription for Augmentin 500/125 once a day post dialysis on dialysis days. The other concerning thing today is the ischemic-looking areas on the lateral left foot and the left anterior foot. She was supposed to see Dr. Pernell Dupre at [facility] today but they did not make it there. Electronic Signature(s) Signed: 09/16/2016 5:40:41 PM By: [name] Najjar MD Entered By: [name] on 09/16/2016 17:31:17 [name] ([id number]) -------------------------------------------------------------------------------- Physical Exam Details [name] Date of Service: 09/16/2016 3:45 PM Patient Name: N. Patient Account Number: [id number] Medical Record Treating RN: [name] [id number] Number: Other Clinician: [birth year] (35 y.o. Treating [name], Wu Date of Birth/Sex: Female) Physician/Extender: G Primary Care [name] Physician: Referring Physician: [name] Wu in Treatment: 56 Constitutional Sitting or standing Blood Pressure is within target range for patient.. Pulse regular and within target range for patient.Marland Kitchen Respirations regular, non-labored and within target range.. Temperature is normal and within the target range for the patient.. As usual the patient does not look well after dialysis. Notes Wound exam #1 right popliteal fossa. This has dusky-looking muscle in it. No major soft tissue involvement. I change the primary dressing eared Aquacel Ag. Culture from last week grew  Klebsiella and enterococcus which should be both Augmentin sensitive #2 the areas on the left lateral foot and left anterior foot look ischemic to me. I gently removed some eschar from the lateral foot but this continues to look ischemic. #3 the area on her left buttock looks stable. #4 the area on the left heel also looks stable #5 the area on the left medial transmetatarsal amputation site also was debrided with a #3 curet removing nonviable subcutaneous tissue even this looks stable Electronic Signature(s) Signed: 09/16/2016 5:40:41 PM By: [name] Najjar MD Entered By: [name] on 09/16/2016 17:35:06 [name] ([id number]) -------------------------------------------------------------------------------- Physician Orders Details [name] Date of Service: 09/16/2016 3:45 PM Patient Name: N. Patient Account Number: [id number] Medical Record Treating RN: [name] [id number] Number: Other Clinician: [birth year] (35 y.o. Treating [name], Wu Date of Birth/Sex: Female) Physician/Extender: G Primary Care [name] Physician: Referring Physician: [name] Wu in Treatment: 43 Verbal / Phone Orders: Yes Clinician: Curtis Wu Read Back and Verified: Yes Diagnosis Coding Wound Cleansing 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,Posterior Amputation Site - Below Knee o Clean wound with Normal Saline. o Cleanse wound with mild soap and water Wound #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 o Clean wound with Normal Saline. o Cleanse wound with mild soap and water  Wound #18 Left Ischial Tuberosity o Clean wound with Normal Saline. o Cleanse wound with mild soap and water Wound #20 Left,Dorsal Foot o Clean wound with Normal Saline. o Clean wound with Normal Saline. o Cleanse wound with mild soap and water o Cleanse wound with mild soap and water Wound #21 Left,Lateral Foot o Clean wound with Normal Saline. o Cleanse wound with mild  soap and water Wound #22 Right Ischial Tuberosity o Clean wound with Normal Saline. o Cleanse wound with mild soap and water Wound #9 Left Calcaneous o Clean wound with Normal Saline. 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,Posterior Amputation Site - Below Knee o Topical Lidocaine 4% cream applied to wound bed prior to debridement JAKKI, DOUGHTY ([id number]) Wound #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 o Topical Lidocaine 4% cream applied to wound bed prior to debridement Wound #18 Left Ischial Tuberosity o Topical Lidocaine 4% cream applied to wound bed prior to debridement Wound #20 Left,Dorsal Foot o Topical Lidocaine 4% cream applied to wound bed prior to debridement o Topical Lidocaine 4% cream applied to wound bed prior to debridement Wound #21 Left,Lateral Foot o Topical Lidocaine 4% cream applied to wound bed prior to debridement Wound #22 Right Ischial Tuberosity o Topical Lidocaine 4% cream applied to wound bed prior to debridement Wound #9 Left Calcaneous o Topical Lidocaine 4% cream applied to wound bed prior to debridement Primary Wound Dressing 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,Posterior Amputation Site - Below Knee o Aquacel Ag - silver alginate Wound #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 o Prisma Ag - or collagen with silver equivalent - may use this under the NPWT dressing Wound #18 Left Ischial Tuberosity o Prisma Ag - or collagen with silver equivalent - may use this under the NPWT dressing Wound #20 Left,Dorsal Foot o Other: - bordered foam dressing only o Other: - bordered foam dressing only Wound #21 Left,Lateral Foot o Other: - bordered foam dressing only Wound #22 Right Ischial Tuberosity o Other: - bordered foam dressing only Wound #9 Left Calcaneous o Prisma Ag - or collagen with silver equivalent - may use this under the NPWT dressing Secondary Dressing 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,Posterior  Amputation Site - Below Knee o Boardered Foam Dressing - on R posterior BKA site wrap lightly with conform Wound #18 Left Ischial Tuberosity o Boardered Foam Dressing - on R posterior BKA site wrap lightly with conform [name], [name] ([id number]) Wound #22 Right Ischial Tuberosity o Boardered Foam Dressing - on R posterior BKA site wrap lightly with conform Dressing Change Frequency 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,Posterior Amputation Site - Below Knee o Change Dressing Monday, Wednesday, Friday - and as needed for soilage Wound #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 o Change Dressing Monday, Wednesday, Friday - and as needed for soilage Wound #18 Left Ischial Tuberosity o Change Dressing Monday, Wednesday, Friday - and as needed for soilage Wound #20 Left,Dorsal Foot o Change Dressing Monday, Wednesday, Friday - and as needed for soilage o Change Dressing Monday, Wednesday, Friday - and as needed for soilage Wound #21 Left,Lateral Foot o Change Dressing Monday, Wednesday, Friday - and as needed for soilage Wound #22 Right Ischial Tuberosity o Change Dressing Monday, Wednesday, Friday - and as needed for soilage Wound #9 Left Calcaneous o Change Dressing Monday, Wednesday, Friday - and as needed for soilage Follow-up Appointments 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 o Other: - as patient is able Wound #17 Left Amputation  Site - Transmetatarsal o Return Appointment in 1 week. o Other: - as patient is able Wound #18 Left Ischial Tuberosity o Return Appointment in 1 week. o Other: - as patient is able Wound #20 Left,Dorsal Foot o Return Appointment in 1 week. o Return Appointment in 1 week. o Other: - as patient is able o Other: - as patient is able Wound #21 Left,Lateral Foot o Return Appointment in 1 week. [name], [name] ([id number]) o Other: - as patient is able Wound #22 Right  Ischial Tuberosity o Return Appointment in 1 week. o Other: - as patient is able Wound #9 Left Calcaneous o Return Appointment in 1 week. 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 Wound #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 o Increase protein intake. Wound #18 Left Ischial Tuberosity o Increase protein intake. Wound #20 Left,Dorsal Foot o Increase protein intake. o Increase protein intake. Wound #21 Left,Lateral Foot o Increase protein intake. Wound #22 Right Ischial Tuberosity o Increase protein intake. Wound #9 Left Calcaneous o Increase protein intake. Home Health 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,Posterior Amputation Site - Below Knee o Continue Home Health Visits - Please give Sita all the PRN visits that she needs o Home Health Nurse may visit PRN to address patientos wound care needs. o FACE TO FACE ENCOUNTER: MEDICARE and MEDICAID PATIENTS: I certify that this patient is under my care and that I had a face-to-face encounter that meets the physician face-to-face encounter requirements with this patient on this date. The encounter with the patient was in whole or in part for the following MEDICAL CONDITION: (primary reason for Home Healthcare) MEDICAL NECESSITY: I certify, that based on my findings, NURSING services are a medically necessary home health service. HOME BOUND STATUS: I certify that my clinical findings support that this patient is homebound (i.e., Due to illness or injury, pt requires aid of supportive devices such as crutches, cane, wheelchairs, walkers, the use of special transportation or the assistance of another person to leave their place of residence. There is a normal inability to leave the home and doing so requires considerable and taxing effort. Other absences are for medical reasons / religious services and are infrequent or of  short duration when for other reasons). RYLA, CAUTHON ([id number]) o If current dressing causes regression in wound condition, may D/C ordered dressing product/s and apply Normal Saline Moist Dressing daily until next Wound Healing Center / Other MD appointment. Notify Wound Healing Center of regression in wound condition at [phone]. o Please direct any NON-WOUND related issues/requests for orders to patient's Primary Care Physician Wound #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 o Continue Home Health Visits - Please give Ashyra all the PRN visits that she needs o Home Health Nurse may visit PRN to address patientos wound care needs. o FACE TO FACE ENCOUNTER: MEDICARE and MEDICAID PATIENTS: I certify that this patient is under my care and that I had a face-to-face encounter that meets the physician face-to-face encounter requirements with this patient on this date. The encounter with the patient was in whole or in part for the following MEDICAL CONDITION: (primary reason for Home Healthcare) MEDICAL NECESSITY: I certify, that based on my findings, NURSING services are a medically necessary home health service. HOME BOUND STATUS: I certify that my clinical findings support that this patient is homebound (i.e., Due to illness or injury, pt requires aid of supportive devices such as crutches,  cane, wheelchairs, walkers, the use of special transportation or the assistance of another person to leave their place of residence. There is a normal inability to leave the home and doing so requires considerable and taxing effort. Other absences are for medical reasons / religious services and are infrequent or of short duration when for other reasons). o If current dressing causes regression in wound condition, may D/C ordered dressing product/s and apply Normal Saline Moist Dressing daily until next Wound Healing Center / Other MD appointment. Notify Wound Healing Center of  regression in wound condition at [phone]. o Please direct any NON-WOUND related issues/requests for orders to patient's Primary Care Physician Wound #18 Left Ischial Tuberosity o Continue Home Health Visits - Please give Denaja all the PRN visits that she needs o Home Health Nurse may visit PRN to address patientos wound care needs. o FACE TO FACE ENCOUNTER: MEDICARE and MEDICAID PATIENTS: I certify that this patient is under my care and that I had a face-to-face encounter that meets the physician face-to-face encounter requirements with this patient on this date. The encounter with the patient was in whole or in part for the following MEDICAL CONDITION: (primary reason for Home Healthcare) MEDICAL NECESSITY: I certify, that based on my findings, NURSING services are a medically necessary home health service. HOME BOUND STATUS: I certify that my clinical findings support that this patient is homebound (i.e., Due to illness or injury, pt requires aid of supportive devices such as crutches, cane, wheelchairs, walkers, the use of special transportation or the assistance of another person to leave their place of residence. There is a normal inability to leave the home and doing so requires considerable and taxing effort. Other absences are for medical reasons / religious services and are infrequent or of short duration when for other reasons). o If current dressing causes regression in wound condition, may D/C ordered dressing product/s and apply Normal Saline Moist Dressing daily until next Wound Healing Center / Other MD appointment. Notify Wound Healing Center of regression in wound condition at [phone]. o Please direct any NON-WOUND related issues/requests for orders to patient's Primary Care Physician Wound #20 Left,Dorsal Foot o Continue Home Health Visits - Please give Nikki all the PRN visits that she needs [name], [name] ([id number]) o Continue Home  Health Visits - Please give Gracey all the PRN visits that she needs o Home Health Nurse may visit PRN to address patientos wound care needs. o Home Health Nurse may visit PRN to address patientos wound care needs. o FACE TO FACE ENCOUNTER: MEDICARE and MEDICAID PATIENTS: I certify that this patient is under my care and that I had a face-to-face encounter that meets the physician face-to-face encounter requirements with this patient on this date. The encounter with the patient was in whole or in part for the following MEDICAL CONDITION: (primary reason for Home Healthcare) MEDICAL NECESSITY: I certify, that based on my findings, NURSING services are a medically necessary home health service. HOME BOUND STATUS: I certify that my clinical findings support that this patient is homebound (i.e., Due to illness or injury, pt requires aid of supportive devices such as crutches, cane, wheelchairs, walkers, the use of special transportation or the assistance of another person to leave their place of residence. There is a normal inability to leave the home and doing so requires considerable and taxing effort. Other absences are for medical reasons / religious services and are infrequent or of short duration when for other reasons). o FACE TO  FACE ENCOUNTER: MEDICARE and MEDICAID PATIENTS: I certify that this patient is under my care and that I had a face-to-face encounter that meets the physician face-to-face encounter requirements with this patient on this date. The encounter with the patient was in whole or in part for the following MEDICAL CONDITION: (primary reason for Home Healthcare) MEDICAL NECESSITY: I certify, that based on my findings, NURSING services are a medically necessary home health service. HOME BOUND STATUS: I certify that my clinical findings support that this patient is homebound (i.e., Due to illness or injury, pt requires aid of supportive devices such as crutches, cane,  wheelchairs, walkers, the use of special transportation or the assistance of another person to leave their place of residence. There is a normal inability to leave the home and doing so requires considerable and taxing effort. Other absences are for medical reasons / religious services and are infrequent or of short duration when for other reasons). o If current dressing causes regression in wound condition, may D/C ordered dressing product/s and apply Normal Saline Moist Dressing daily until next Wound Healing Center / Other MD appointment. Notify Wound Healing Center of regression in wound condition at [phone]. o If current dressing causes regression in wound condition, may D/C ordered dressing product/s and apply Normal Saline Moist Dressing daily until next Wound Healing Center / Other MD appointment. Notify Wound Healing Center of regression in wound condition at [phone]. o Please direct any NON-WOUND related issues/requests for orders to patient's Primary Care Physician o Please direct any NON-WOUND related issues/requests for orders to patient's Primary Care Physician Wound #21 Left,Lateral Foot o Continue Home Health Visits - Please give Akeia all the PRN visits that she needs o Home Health Nurse may visit PRN to address patientos wound care needs. o FACE TO FACE ENCOUNTER: MEDICARE and MEDICAID PATIENTS: I certify that this patient is under my care and that I had a face-to-face encounter that meets the physician face-to-face encounter requirements with this patient on this date. The encounter with the patient was in whole or in part for the following MEDICAL CONDITION: (primary reason for Home Healthcare) MEDICAL NECESSITY: I certify, that based on my findings, NURSING services are a medically necessary home health service. HOME BOUND STATUS: I certify that my clinical findings support that this patient is homebound (i.e., Due to illness or injury, pt  requires aid of supportive devices such as crutches, cane, wheelchairs, walkers, the use of special transportation or the assistance of another person to leave their place of residence. There is a Haskell, Anum N. ([id number]) normal inability to leave the home and doing so requires considerable and taxing effort. Other absences are for medical reasons / religious services and are infrequent or of short duration when for other reasons). o If current dressing causes regression in wound condition, may D/C ordered dressing product/s and apply Normal Saline Moist Dressing daily until next Wound Healing Center / Other MD appointment. Notify Wound Healing Center of regression in wound condition at [phone]. o Please direct any NON-WOUND related issues/requests for orders to patient's Primary Care Physician Wound #22 Right Ischial Tuberosity o Continue Home Health Visits - Please give Evalise all the PRN visits that she needs o Home Health Nurse may visit PRN to address patientos wound care needs. o FACE TO FACE ENCOUNTER: MEDICARE and MEDICAID PATIENTS: I certify that this patient is under my care and that I had a face-to-face encounter that meets the physician face-to-face encounter requirements with this patient on this  date. The encounter with the patient was in whole or in part for the following MEDICAL CONDITION: (primary reason for Home Healthcare) MEDICAL NECESSITY: I certify, that based on my findings, NURSING services are a medically necessary home health service. HOME BOUND STATUS: I certify that my clinical findings support that this patient is homebound (i.e., Due to illness or injury, pt requires aid of supportive devices such as crutches, cane, wheelchairs, walkers, the use of special transportation or the assistance of another person to leave their place of residence. There is a normal inability to leave the home and doing so requires considerable and taxing effort.  Other absences are for medical reasons / religious services and are infrequent or of short duration when for other reasons). o If current dressing causes regression in wound condition, may D/C ordered dressing product/s and apply Normal Saline Moist Dressing daily until next Wound Healing Center / Other MD appointment. Notify Wound Healing Center of regression in wound condition at [phone]. o Please direct any NON-WOUND related issues/requests for orders to patient's Primary Care Physician Wound #9 Left Calcaneous o Continue Home Health Visits - Please give Izela all the PRN visits that she needs o Home Health Nurse may visit PRN to address patientos wound care needs. o FACE TO FACE ENCOUNTER: MEDICARE and MEDICAID PATIENTS: I certify that this patient is under my care and that I had a face-to-face encounter that meets the physician face-to-face encounter requirements with this patient on this date. The encounter with the patient was in whole or in part for the following MEDICAL CONDITION: (primary reason for Home Healthcare) MEDICAL NECESSITY: I certify, that based on my findings, NURSING services are a medically necessary home health service. HOME BOUND STATUS: I certify that my clinical findings support that this patient is homebound (i.e., Due to illness or injury, pt requires aid of supportive devices such as crutches, cane, wheelchairs, walkers, the use of special transportation or the assistance of another person to leave their place of residence. There is a normal inability to leave the home and doing so requires considerable and taxing effort. Other absences are for medical reasons / religious services and are infrequent or of short duration when for other reasons). o If current dressing causes regression in wound condition, may D/C ordered dressing product/s and apply Normal Saline Moist Dressing daily until next Wound Healing Center / Other MD appointment.  Notify Wound Healing Center of regression in wound condition at [phone]. o Please direct any NON-WOUND related issues/requests for orders to patient's Primary Care Physician LYNAE, PEDERSON ([id number]) Negative Pressure Wound Therapy Wound #18 Left Ischial Tuberosity o Wound VAC settings at 125/130 mmHg continuous pressure. Use BLACK/GREEN foam to wound cavity. Use WHITE foam to fill any tunnel/s and/or undermining. Change VAC dressing 3 X WEEK. Change canister as indicated when full. Nurse may titrate settings and frequency of dressing changes as clinically indicated. - place some white foam under the undermining as well as the Prisma place foam around the peri wound o Home Health Nurse may d/c VAC for s/s of increased infection, significant wound regression, or uncontrolled drainage. Notify Wound Healing Center at [phone]. Patient Medications Allergies: no known drug allergies Notifications Medication Indication Start End Augmentin infection. Given after dialysis on dialysis days DOSE 1 - oral 500 mg-125 mg tablet - 1 tablet oral Electronic Signature(s) Signed: 09/16/2016 4:46:44 PM By: [name] Najjar MD Entered By: [name] on 09/16/2016 16:46:41 [name] ([id number]) -------------------------------------------------------------------------------- Problem List Details [name] Date  of Service: 09/16/2016 3:45 PM Patient Name: N. Patient Account Number: [id number] Medical Record Treating RN: [name] [id number] Number: Other Clinician: 22-Apr-1981 (36 y.o. Treating [name], Wu Date of Birth/Sex: Female) Physician/Extender: G Primary Care [name] Physician: Referring Physician: [name] Wu in Treatment: 88 Active Problems ICD-10 Encounter Code Description Active Date Diagnosis E10.621 Type 1 diabetes mellitus with foot ulcer 08/30/2015 Yes E10.52 Type 1 diabetes mellitus with diabetic peripheral  08/30/2015 Yes angiopathy with gangrene I70.245 Atherosclerosis of native arteries of left leg with ulceration 08/30/2015 Yes of other part of foot I70.261 Atherosclerosis of native arteries of extremities with 08/30/2015 Yes gangrene, right leg L89.622 Pressure ulcer of left heel, stage 2 08/30/2015 Yes Z89.511 Acquired absence of right leg below knee 11/29/2015 Yes T87.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, right lower extremity 04/02/2016 Yes S71.101A Unspecified open wound, right thigh, initial encounter 04/02/2016 Yes L89.322 Pressure ulcer of left buttock, stage 2 06/10/2016 Yes Men, Makynli N. ([id number]) L89.899 Pressure ulcer of other site, unspecified stage 08/06/2016 Yes L89.223 Pressure ulcer of left hip, stage 3 08/05/2016 Yes Inactive Problems Resolved Problems ICD-10 Code Description Active Date Resolved Date L02.611 Cutaneous abscess of right foot 10/01/2015 10/01/2015 Electronic Signature(s) Signed: 09/16/2016 5:40:41 PM By: [name] Najjar MD Entered By: [name] on 09/16/2016 17:28:51 [name] ([id number]) -------------------------------------------------------------------------------- Progress Note Details [name] Date of Service: 09/16/2016 3:45 PM Patient Name: N. Patient Account Number: [id number] Medical Record Treating RN: [name] [id number] Number: Other Clinician: 18-Oct-1980 (36 y.o. Treating [name], Wu Date of Birth/Sex: Female) Physician/Extender: G Primary Care [name] Physician: Referring Physician: [name] Wu in Treatment: 35 Subjective Chief Complaint Information obtained from Patient Ms. Furber returns for evaluation of multiple ulcerations History of Present Illness (HPI) The following HPI elements were documented for the patient's wound: Location: dry gangrene both feet and heels Quality: Patient reports No Pain. Severity: Patient states wound are getting worse. Duration: Patient has had the  wound for > 4months prior to seeking treatment at the wound center Context: The wound appeared gradually over time Modifying Factors: she has been in and out of hospital over the last 2 months Associated Signs and Symptoms: Patient reports having difficulty standing for long periods. Fidelia Cathers is a 36 y.o. female who presents to our wound center, back in June 2016, referred by her PCP Dr. Zada Wu for nonhealing ulcers on the lateral aspect of the right heel. Of note she has a history of type 1 diabetes mellitus that has been uncontrolled. Past medical history significant for type 1 diabetes mellitus not controlled, ankylosing spondylitis, anorexia nervosa, irritable bowel syndrome, chronic kidney disease, chronic diarrhea. she then developed gangrene of both feet due to severe peripheral vascular disease and also had gangrene of the tips of her fingers due to upper extremity vascular disease. She was being worked up by vascular surgery at [facility] and at [facility] and has had several procedures done there. She started with hyperbaric oxygen therapy and had a total of 40 treatments the last one being on 06/20/2015. After the initial treatment of hyperbaric oxygen therapy she started having ear problems and had ultimately to use myringotomy tubes and this was done bilaterally. Since then her ears have been doing fine. In late September, she had seen vascular and hand surgeons. since then she's been in [place] [facility] for surgery involving extensive vascular procedures for the upper extremities. She was then at [facility] with GI bleeds both upper and lower and has been in and  out of hospital for that. She has recently been out of hospital for the last week. 09/09/2015 -- she was unable to get here in time to start her hyperbaric oxygen today and hence is only here for a wound care visit. 09/19/2015 -- she has been having vancomycin during her dialysis and continues to have  vascular appointments and the procedure is been set for early January. She has been unable to make it for her CAREY, LAFON. ([id number]) hemodialysis due to various medical symptoms. 09/30/2014 -- her vancomycin was stopped on 09/25/2015 and the mother has noticed the right foot has started draining for the last 3 days. Addendum: after examining the patient I was able to talk to her primary vascular surgeon Dr. Pernell Dupre at the [facility]. I told him about the necrotic area on the plantar aspect of right foot which is now wet gangrene and he agreed with me that he would admit her at [facility] under her care and synchronize further treatment. We have discussed her poor prognosis and he and I discussed the need for hospice care and for sitting down and talking to the patient and her mother and giving them a proper detail of the prognosis. 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 She was worked up with MRIs, abdomen aortogram and placement of left-sided angioplasties were done. After a prolonged hospital she she was discharged home and was told to wear shrinker sock and stump protector and see her surgeon for further instructions regarding wound care and suture removal. Asked to take long-term doxycycline. 01/15/16; this is a patient I haven't seen before although she is been followed by Dr. Meyer Russel in this clinic today. She is a type I diabetic with severe PAD macrovascular disease. She has had a previous BKA. She has dry gangrene of the tips of her fingers which she showed me on the right to.  She also has dry gangrene of the left first second and third toes and a portion of her proximal foot around these areas. She is followed by vascular surgery at [facility] and saw them recently they are not going to do surgery as of yet. She has a large black eschar over her heel which is beginning to separate in some areas. As I understand think she is paining these with Betadine. There is been some suggestion about retrying hyperbarics on her although she is still not able to commit to the frequency of treatment that would be necessary to see improvement. She is also on Monday Wednesday Friday dialysis 01/21/16; the patient returns to see me today with regards to the left heel. Apparently she is not scheduled for any further attempts at revascularization of the left foot. 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 01/28/16; the patient returns today for attention to the left foot specifically the left calcaneal ulcer. This is covered in a thick black eschar. I crosshatched this last week and we have been using Santyl. 02/05/16; I continue to work on the thick black eschar on the patient's left calcaneus. She is using Santyl that this  side crosshatched this area and the eschar is beginning to loosen. 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 This is beginning to separate and liquefy as well especially between the first and second toes. In the time being her major complaint is fatigue at dialysis 02/26/16 I continue to work on the patient's left calcaneus thick adherent black eschar. I crosshatched this area and we have been using Santyl although it is very adherent area I remove some nonviable tissue today what I can see of this actually looks surprisingly good. On the same foot she has dry gangrene  on the first and second toes and part of the forefoot underneath this. This is beginning to separate. 03/11/16; the patient comes in for her every 2 week appointment. I have been working on the black eschar on her heel. The patient apparently again has to come off dialysis early today after 2 hours due to severe complaints of nausea. She really does not look well. 04/02/2016 -- the patient has not been seen here for about 3 weeks now and has a new issue with the stump of the right amputation site and also her right posterior thigh. They have only noticed this for the last couple of days. 04/28/16; I had received a call from the patient's surgeon at [facility]. She had a left transmetatarsal amputation and Integra applied to the left heel. Both of these areas appear to be doing well. There are dressing these [name], [name] ([id number]) with Kandis Mannan and they will return to their surgeon on Thursday. She has a new injury on the popliteal fossa on the right which I think was trauma from her stump. 05/20/16; the patient is following with her surgeon at [facility]. She's had a left transmetatarsal debridement of the left heel she had Integra place and apparently is using some consternation of Epson salts soaks, Betadine and Aquacel Ag. The left leg is wrapped I did not look at this today. She has the wound in her right popliteal fossa which is apparently a pressure area possibly related to sitting on a toilet for 2-3 hours multiple times a day. 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 We have been using Santyl to the area on the right popliteal fossa. She has a new wound on her right gluteal area but the patient would not let me look at that today 06/03/16; patient is not doing particularly well. She now has a pressure area over her left ischial tuberosity. This is of quite a size and covered with an adherent surface slough. She  looks as though she has lost weight, I didn't want to go ahead and attempt to debridement this today. There is really no evidence of infection. The area behind the right popliteal fossa looks about the same as 2 weeks ago we have been using Santyl to this area. I did not look at her left foot which is wrapped been followed by podiatry at [facility] 06/10/16; patient arrives in clinic actually looking a lot brighter than I usually see her, predictably she did not go to dialysis today. For the first time in perhaps 6 weeks I actually saw her left foot today. The transmetatarsal site is healed. The left heel has a reasonably stable-looking wound which has a clean base. Some eschar superiorly and a few sutures  remain in place. More worrisome is an area on her lateral left foot over the metatarsal head. Quarter size necrotic wound that probes to bone. There is some purulence here which I cultured there is nothing that looks like a healthy base of this area. They've been using silver alginate to this area at home. She also has a large wound in the right popliteal fossa, and again a pressure area over the left ischial tuberosity. We are using Santyl to both of these areas. Both allays looks somewhat better than last week, using Santyl to both areas 06/17/16: culture from last week grew citrobacter and amp sensitive enterococcus fecalis. Started on vanc last Saturday at dialysis. have spoken to dialysis in [place] re adjustment in antibiotics added ceftazidine to vanc 06/24/16; the patient was discharged from [facility] yesterday. She had an IandD of the open area on the lateral aspect of her foot. She continues on vancomycin and Ceptaz again at dialysis although I'm not exactly sure of the current duration of this. She also had a debridement of the wound over the left greater trochanter done by the wound care team. She is receiving Santyl based dressings to this and the area in her right popliteal fossa. She  arrives today completely fatigued from dialysis. I spoke to Dr. Allena Katz her podiatrist and surgeon at [facility] and asked if we could manage the wound VAC which I think we can. He also wanted to ask about hyperbaric oxygen with the indication of chronic osteomyelitis although at this point I am not completely certain where the chronic osteomyelitis is. Finally she apparently has had a re-graft to the area on her left heel which is either a skin graft or Integra 07/15/16; patient arrives today she is not been in the clinic since I saw her on 9/27. She's been using Santyl to the left greater trochanter, I popliteal fossa. Her home health nurse remove the wound VAC from the transmetatarsal head, there is only a small wound on the medial aspect of the foot. Dr. Allena Katz who is been managing the heel wounds has requested Xeroform to the heel. 07/22/16; patient arrives as usual postdialysis she is very fatigued and weak looking. She generally tolerates dialysis poorly requiring midodrine tosupport her blood pressure. 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 This was debrided of surface slough nonviable subcutaneous tissue the base of this looks healthy and improved. Her left heel wound is followed by by Dr. Allena Katz at [facility] her surgeon. We have been applying Xeroform to this at his request, he'll She has a deep wound over her left greater trochanter however this does not appear to be infected has healthy granulation and I think could be well served by a wound VAC with collagen under the foam Under the right popliteal fossa there is a pressure area apparently from a toilet seat. This has been making progress again the tissue here appears to be healthy we have been using silver collagen in this area as well [name], [name] ([id number]) There is a new small area on the lateral aspect of her right  stump which appears to be draining purulent material. Our nurses obtained a specimen of this for culture. As noted she is already onVAncomycin e a third generation cephalosporin 08/05/16 -Ms. Rizo arrives today accompanied by her aunt and cousin, she did not receive dialysis today due to "feeling bad" and is scheduled to received dialysis  tomorrow. She has completed vancomycin that she was receiving with dialysis. She has an appointment with podiatry on Friday; admits to using right prosthesis for transfers primarily -She has a wound on the lateral left TMA site, essentially unchanged and stable; staples remain in medial aspect of healed surgical site from August, patient states that the plan is to remove staples on Friday -Left heel ulcer appears stable with granulation tissue -stage III left trochanter pressure ulcer with peri-wound irritation, appears to be from leakage despite patient stating that VAC has not leaked, has been using Prisma and wound VAC -Right popliteal fossa pressure ulcer is stable -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 and pale erythema to lateral aspect, improved from last week, although purulence able to be expressed, will encourage warm compresses to aide in bring fluid to surface - new areas of DTI to left foot dorsal and lateral aspect; patient admits that foot has been wrapped with ace per podiatry, she also wears surgical shoe to left foot; dorsal DTI has intact blister; dorsal DTI not present at last appointment, lateral DTI present but significantly smaller at last appointment; both areas (dorsal>lateral) concerning for threatening limb loss, new to size and location - she has seen "limb loss specialist" at [facility] in the past and was encouraged to follow up with that provider in presence of new development 09/08/16 I have not seen this patient in quite some time perhaps late October. She was seen once here at the beginning of November however her  appointments are scheduled after dialysis at which time she doesn't feel well enough to come. Also noted recently our staff of taken several phone calls from her home health nurse that been concerned about worsening wounds on the back of the right knee left greater trochanter. We brought her in today to go over this. As far as I can tell the wounds are as follows; #1 pressure ulcer on the back of her right knee in the popliteal fossa. As best I can tell this is caused by prolonged sitting on the toilet seat related to ongoing issues with profuse, high-volume diarrhea #2 pressure ulcer over the left greater trochanter. We have been using a wound VAC on this area #3 probing ulcer on the medial aspect of her left transmetatarsal amputation #4 pressure ulcer on the left posterior calcaneus #5 excoriation on the lateral aspect of her left foot which is not clearly open but was concerning last time she was here for a deep tissue injury. She is off antibiotics at dialysis since the last time I saw her. This was for underlying osteomyelitis. 09/16/16; she arrives today with the pressure ulcer on the back of her right knee still draining and looking somewhat worse. Culture I did last week grew Klebsiella and enterococcus which is ampicillin sensitive. I therefore have given her prescription for Augmentin 500/125 once a day post dialysis on dialysis days. The other concerning thing today is the ischemic-looking areas on the lateral left foot and the left anterior foot. She was supposed to see Dr. Pernell Dupre at [facility] today but they did not make it there. Objective [name], [name] ([id number]) Constitutional Sitting or standing Blood Pressure is within target range for patient.. Pulse regular and within target range for patient.Marland Kitchen Respirations regular, non-labored and within target range.. Temperature is normal and within the target range for the patient.. As usual the patient does not look well after  dialysis. Vitals Time Taken: 4:00 PM, Height: 68 in, Weight: 96 lbs, BMI:  14.6, Temperature: 98.2 F, Pulse: 82 bpm, Respiratory Rate: 14 breaths/min, Blood Pressure: 105/48 mmHg. General Notes: Wound exam #1 right popliteal fossa. This has dusky-looking muscle in it. No major soft tissue involvement. I change the primary dressing eared Aquacel Ag. Culture from last week grew Klebsiella and enterococcus which should be both Augmentin sensitive #2 the areas on the left lateral foot and left anterior foot look ischemic to me. I gently removed some eschar from the lateral foot but this continues to look ischemic. #3 the area on her left buttock looks stable. #4 the area on the left heel also looks stable #5 the area on the left medial transmetatarsal amputation site also was debrided with a #3 curet removing nonviable subcutaneous tissue even this looks stable Integumentary (Hair, Skin) Wound #16 status is Open. Original cause of wound was Pressure Injury. 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 The wound measures 4.5cm length x 4.3cm width x 0.5cm depth; 15.197cm^2 area and 7.599cm^3 volume. There is fat exposed. There is no tunneling noted, however, there is undermining starting at 4:00 and ending at 7:00 with a maximum distance of 0.6cm. There is a large amount of purulent drainage noted. The wound margin is distinct with the outline attached to the wound base. There is no granulation within the wound bed. There is a large (67-100%) amount of necrotic tissue within the wound bed including Eschar and Adherent Slough. The periwound skin appearance exhibited: Induration, Localized Edema, Moist, Erythema. The periwound skin appearance did not exhibit: Callus, Crepitus, Excoriation, Fluctuance, Friable, Rash, Scarring, Dry/Scaly, Maceration, Atrophie Blanche, Cyanosis, Ecchymosis, Hemosiderin Staining, Mottled, Pallor, Rubor. The surrounding wound skin color  is noted with erythema which is circumferential. Periwound temperature was noted as No Abnormality. The periwound has tenderness on palpation. Wound #17 status is Open. Original cause of wound was Surgical Injury. The wound is located on the Left Amputation Site - Transmetatarsal. The wound measures 1cm length x 0.6cm width x 0.2cm depth; 0.471cm^2 area and 0.094cm^3 volume. There is fat exposed. There is no tunneling or undermining noted. There is a large amount of serous drainage noted. The wound margin is flat and intact. There is no granulation within the wound bed. There is a medium (34-66%) amount of necrotic tissue within the wound bed including Adherent Slough. The periwound skin appearance exhibited: Moist, Ecchymosis. The periwound skin appearance did not exhibit: Callus, Crepitus, Excoriation, Fluctuance, Friable, Induration, Localized Edema, Rash, Scarring, Dry/Scaly, Maceration, Atrophie Blanche, Cyanosis, Hemosiderin Staining, Mottled, Pallor, Rubor, Erythema. Wound #18 status is Open. Original cause of wound was Pressure Injury. The wound is located on the Left Ischial Tuberosity. The wound measures 2.5cm length x 2cm width x 0.4cm depth; 3.927cm^2 area and 1.571cm^3 volume. The wound is limited to skin breakdown. There is no undermining noted, however, there is tunneling at 7:00 with a maximum distance of 1.8cm. There is a large amount of serosanguineous drainage noted. The wound margin is flat and intact. There is large (67-100%) pink granulation within the wound bed. There is a small (1-33%) amount of necrotic tissue within the wound bed including Adherent Slough. The periwound skin appearance exhibited: Moist, Erythema. The periwound skin appearance did [name], [name] ([id number]) not exhibit: Callus, Crepitus, Excoriation, Fluctuance, Friable, Induration, Localized Edema, Rash, Scarring, Dry/Scaly, Maceration, Atrophie Blanche, Cyanosis, Ecchymosis, Hemosiderin  Staining, Mottled, Pallor, Rubor. The surrounding wound skin color is noted with erythema which is circumferential. Wound #20 status is Open. Original cause of wound was Pressure  Injury. The wound is located on the Left,Dorsal Foot. The wound measures 3.3cm length x 3.4cm width x 0.1cm depth; 8.812cm^2 area and 0.881cm^3 volume. The wound is limited to skin breakdown. There is no tunneling or undermining noted. There is a none present amount of drainage noted. The wound margin is flat and intact. There is no granulation within the wound bed. There is a large (67-100%) amount of necrotic tissue within the wound bed including Eschar. The periwound skin appearance exhibited: Dry/Scaly. Wound #21 status is Open. Original cause of wound was Pressure Injury. The wound is located on the Left,Lateral Foot. The wound measures 2.2cm length x 1.1cm width x 0.1cm depth; 1.901cm^2 area and 0.19cm^3 volume. The wound is limited to skin breakdown. There is no tunneling or undermining noted. There is a none present amount of drainage noted. The wound margin is flat and intact. There is no granulation within the wound bed. There is a large (67-100%) amount of necrotic tissue within the wound bed including Eschar. The periwound skin appearance exhibited: Induration, Ecchymosis. The periwound skin appearance did not exhibit: Callus, Crepitus, Excoriation, Fluctuance, Friable, Localized Edema, Rash, Scarring, Dry/Scaly, Maceration, Moist, Atrophie Blanche, Cyanosis, Hemosiderin Staining, Mottled, Pallor, Rubor, Erythema. Wound #22 status is Open. Original cause of wound was Pressure Injury. The wound is located on the Right Ischial Tuberosity. The wound measures 1.4cm length x 2.3cm width x 0.1cm depth; 2.529cm^2 area and 0.253cm^3 volume. The wound is limited to skin breakdown. There is no tunneling or undermining noted. There is a medium amount of serous drainage noted. The wound margin is flat and intact. There  is no granulation within the wound bed. There is a large (67-100%) amount of necrotic tissue within the wound bed including Adherent Slough. The periwound skin appearance exhibited: Ecchymosis. The periwound skin appearance did not exhibit: Callus, Crepitus, Excoriation, Fluctuance, Friable, Induration, Localized Edema, Rash, Scarring, Dry/Scaly, Maceration, Moist, Atrophie Blanche, Cyanosis, Hemosiderin Staining, Mottled, Pallor, Rubor, Erythema. Wound #9 status is Open. Original cause of wound was Pressure Injury. The wound is located on the Left Calcaneous. The wound measures 3cm length x 2.3cm width x 0.1cm depth; 5.419cm^2 area and 0.542cm^3 volume. There is fat exposed. There is no tunneling or undermining noted. There is a large amount of serosanguineous drainage noted. The wound margin is flat and intact. There is medium (34- 66%) red granulation within the wound bed. There is a medium (34-66%) amount of necrotic tissue within the wound bed including Eschar and Adherent Slough. The periwound skin appearance exhibited: Moist. The periwound skin appearance did not exhibit: Callus, Crepitus, Excoriation, Fluctuance, Friable, Induration, Localized Edema, Rash, Scarring, Dry/Scaly, Maceration, Atrophie Blanche, Cyanosis, Ecchymosis, Hemosiderin Staining, Mottled, Pallor, Rubor, Erythema. General Notes: large amount of dressing stuck to wound area. 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 There was a Skin/Subcutaneous Tissue Debridement ([id number]) debridement with total area of 0.6 sq cm performed by [name] Caul, MD. with the following instrument(s): Curette to remove Viable and Non-Viable tissue/material including Exudate, Fibrin/Slough, and Subcutaneous after achieving pain control using Lidocaine 4% Topical Solution. A time out was conducted at 16:29, prior to the start of the procedure. A Minimum amount of bleeding was controlled with Pressure. The procedure was tolerated well with a pain level of 0 throughout and a pain level of 0 following the procedure. Post Debridement Measurements: 1cm length x 0.6cm width x 0.2cm depth; 0.094cm^3 volume. Character of Wound/Ulcer Post Debridement requires further debridement. Severity of Tissue Post Debridement is: Fat layer exposed. Post procedure Diagnosis Wound #17: Same as Pre-Procedure Wound #21 Wound #21 is a Pressure Ulcer located on the Left,Lateral Foot . There was a Skin/Subcutaneous Tissue Debridement ([id number]) debridement with total area of 2.42 sq cm performed by [name] Caul, MD. with the following instrument(s): Curette to remove Viable and Non-Viable tissue/material including Exudate, Fibrin/Slough, Eschar, and Subcutaneous after achieving pain control using Lidocaine 4% Topical Solution. A time out was conducted at 16:29, prior to the start of the procedure. A Minimum amount of bleeding was controlled with Pressure. The procedure was tolerated well with a pain level of 0 throughout and a pain level of 0 following the procedure. Post Debridement Measurements:  2.2cm length x 1.1cm width x 0.1cm depth; 0.19cm^3 volume. Post debridement Stage noted as Unstageable/Unclassified. Character of Wound/Ulcer Post Debridement requires further debridement. Severity of Tissue Post Debridement is: Fat layer exposed. Post procedure Diagnosis Wound #21: Same as Pre-Procedure Wu, [name] ([id number]) Plan Wound Cleansing: 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 Cleanse wound with mild soap and water Wound #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: Clean wound with Normal Saline. Cleanse wound with mild soap and water Wound #18 Left Ischial Tuberosity: Clean wound with Normal Saline. Cleanse wound with mild soap and water Wound #20 Left,Dorsal Foot: Clean wound with Normal Saline. Clean wound with Normal Saline. Cleanse wound with mild soap and water Cleanse wound with mild soap and water Wound #21 Left,Lateral Foot: Clean wound with Normal Saline. Cleanse wound with mild soap and water Wound #22 Right Ischial Tuberosity: Clean wound with Normal Saline. Cleanse wound with mild soap and water Wound #9 Left Calcaneous: Clean wound with Normal Saline. 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,Posterior Amputation Site - Below Knee: Topical Lidocaine 4% cream applied to wound bed prior to debridement Wound #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: Topical Lidocaine 4% cream applied to wound bed prior to debridement Wound #18 Left Ischial Tuberosity: Topical Lidocaine 4% cream applied to wound bed prior to debridement Wound #20 Left,Dorsal Foot: Topical Lidocaine 4% cream applied to wound bed prior to debridement Topical Lidocaine 4% cream applied to wound bed prior to debridement Wound #21 Left,Lateral Foot: Topical Lidocaine 4% cream applied to wound bed prior to debridement Wound #22 Right Ischial Tuberosity: Topical Lidocaine 4% cream applied to wound bed prior  to debridement Wound #9 Left Calcaneous: Topical Lidocaine 4% cream applied to wound bed prior to debridement Primary Wound Dressing: 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,Posterior Amputation Site - Below Knee: Aquacel Ag - silver alginate Wound #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: Prisma Ag - or collagen with silver equivalent - may use this under the NPWT dressing Wound #18 Left Ischial  Tuberosity: Prisma Ag - or collagen with silver equivalent - may use this under the NPWT dressing Wound #20 Left,Dorsal Foot: [name], [name] ([id number]) Other: - bordered foam dressing only Other: - bordered foam dressing only Wound #21 Left,Lateral Foot: Other: - bordered foam dressing only Wound #22 Right Ischial Tuberosity: Other: - bordered foam dressing only Wound #9 Left Calcaneous: Prisma Ag - or collagen with silver equivalent - may use this under the NPWT dressing Secondary Dressing: 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,Posterior Amputation Site - Below Knee: Boardered Foam Dressing - on R posterior BKA site wrap lightly with conform Wound #18 Left Ischial Tuberosity: Boardered Foam Dressing - on R posterior BKA site wrap lightly with conform Wound #22 Right Ischial Tuberosity: Boardered Foam Dressing - on R posterior BKA site wrap lightly with conform Dressing Change Frequency: 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,Posterior Amputation Site - Below Knee: Change Dressing Monday, Wednesday, Friday - and as needed for soilage Wound #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: Change Dressing Monday, Wednesday, Friday - and as needed for soilage Wound #18 Left Ischial Tuberosity: Change Dressing Monday, Wednesday, Friday - and as needed for soilage Wound #20 Left,Dorsal Foot: Change Dressing Monday, Wednesday, Friday - and as needed for soilage Change Dressing Monday, Wednesday, Friday - and as needed for soilage Wound #21 Left,Lateral Foot: Change Dressing Monday, Wednesday, Friday - and as needed for soilage Wound #22 Right Ischial  Tuberosity: Change Dressing Monday, Wednesday, Friday - and as needed for soilage Wound #9 Left Calcaneous: Change Dressing Monday, Wednesday, Friday - and as needed for soilage Follow-up Appointments: 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 Other: - as patient is able Wound #18 Left Ischial Tuberosity: Return Appointment in 1 week. Other: - as patient is able Wound #20 Left,Dorsal Foot: Return Appointment in 1 week. Return Appointment in 1 week. Other: - as patient is able Other: - as patient is able Wound #21 Left,Lateral Foot: Return Appointment in 1 week. Other: - as patient is able Wound #22 Right Ischial Tuberosity: Return Appointment in 1 week. PALLAS, WAHLERT ([id number]) Other: - as patient is able Wound #9 Left Calcaneous: Return Appointment in 1 week. 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 Wound #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: Increase protein intake. Wound #18 Left Ischial Tuberosity: Increase protein intake. Wound #20 Left,Dorsal Foot: Increase protein intake. Increase protein intake. Wound #21 Left,Lateral Foot: Increase protein intake. Wound #22 Right Ischial Tuberosity: Increase protein intake. Wound #9 Left Calcaneous: Increase protein intake. 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 FACE TO FACE ENCOUNTER: MEDICARE and MEDICAID PATIENTS: I certify that this patient is under my care and that I had a face-to-face encounter that meets the physician face-to-face encounter requirements with this patient  on this date. The encounter with the patient was in whole or in part for the following MEDICAL CONDITION: (primary reason for Home Healthcare) MEDICAL NECESSITY: I certify, that based on my findings, NURSING services are a medically necessary home health service. HOME BOUND STATUS: I certify that my clinical findings support that this patient is homebound (i.e., Due to illness or injury, pt requires  aid of supportive devices such as crutches, cane, wheelchairs, walkers, the use of special transportation or the assistance of another person to leave their place of residence. There is a normal inability to leave the home and doing so requires considerable and taxing effort. Other absences are for medical reasons / religious services and are infrequent or of short duration when for other reasons). If current dressing causes regression in wound condition, may D/C ordered dressing product/s and apply Normal Saline Moist Dressing daily until next Wound Healing Center / Other MD appointment. Notify Wound Healing Center of regression in wound condition at [phone]. 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 FACE TO FACE ENCOUNTER: MEDICARE and MEDICAID PATIENTS: I certify that this patient is under my care and that I had a face-to-face encounter that meets the physician face-to-face encounter requirements with this patient on this date. The encounter with the patient was in whole or in part for the following MEDICAL CONDITION: (primary reason for Home Healthcare) MEDICAL NECESSITY: I certify, that based on my findings, NURSING services are a medically necessary home health service. HOME BOUND STATUS: I certify that my clinical findings support  that this patient is homebound (i.e., Due to illness or injury, pt requires aid of supportive devices such as crutches, cane, wheelchairs, walkers, the use of special transportation or the assistance of another person to leave their place of residence. There is a normal inability to leave the home and doing so requires considerable and taxing effort. Other absences are for medical reasons / religious services and are infrequent or of short duration when for other reasons). SHANDREA, LUSK ([id number]) If current dressing causes regression in wound condition, may D/C ordered dressing product/s and apply Normal Saline Moist Dressing daily until next Wound Healing Center / Other MD appointment. Notify Wound Healing Center of regression in wound condition at [phone]. Please direct any NON-WOUND related issues/requests for orders to patient's Primary Care Physician Wound #18 Left Ischial Tuberosity: Continue Home Health Visits - Please give Dedee all the PRN visits that she needs Home Health Nurse may visit PRN to address patient s wound care needs. FACE TO FACE ENCOUNTER: MEDICARE and MEDICAID PATIENTS: I certify that this patient is under my care and that I had a face-to-face encounter that meets the physician face-to-face encounter requirements with this patient on this date. The encounter with the patient was in whole or in part for the following MEDICAL CONDITION: (primary reason for Home Healthcare) MEDICAL NECESSITY: I certify, that based on my findings, NURSING services are a medically necessary home health service. HOME BOUND STATUS: I certify that my clinical findings support that this patient is homebound (i.e., Due to illness or injury, pt requires aid of supportive devices such as crutches, cane, wheelchairs, walkers, the use of special transportation or the assistance of another person to leave their place of residence. There is a normal inability to leave the home and doing  so requires considerable and taxing effort. Other absences are for medical reasons / religious services and are infrequent or of short duration when for other reasons). If current dressing causes regression in wound condition, may D/C ordered dressing product/s and apply Normal Saline Moist Dressing daily until next Wound Healing Center / Other MD appointment. Notify  Wound Healing Center of regression in wound condition at [phone]. Please direct any NON-WOUND related issues/requests for orders to patient's Primary Care Physician Wound #20 Left,Dorsal Foot: Continue Home Health Visits - Please give Shekinah all the PRN visits that she needs Continue Home Health Visits - Please give Ani all the PRN visits that she needs Home Health Nurse may visit PRN to address patient s wound care needs. Home Health Nurse may visit PRN to address patient s wound care needs. FACE TO FACE ENCOUNTER: MEDICARE and MEDICAID PATIENTS: I certify that this patient is under my care and that I had a face-to-face encounter that meets the physician face-to-face encounter requirements with this patient on this date. The encounter with the patient was in whole or in part for the following MEDICAL CONDITION: (primary reason for Home Healthcare) MEDICAL NECESSITY: I certify, that based on my findings, NURSING services are a medically necessary home health service. HOME BOUND STATUS: I certify that my clinical findings support that this patient is homebound (i.e., Due to illness or injury, pt requires aid of supportive devices such as crutches, cane, wheelchairs, walkers, the use of special transportation or the assistance of another person to leave their place of residence. There is a normal inability to leave the home and doing so requires considerable and taxing effort. Other absences are for medical reasons / religious services and are infrequent or of short duration when for other reasons). FACE TO FACE ENCOUNTER:  MEDICARE and MEDICAID PATIENTS: I certify that this patient is under my care and that I had a face-to-face encounter that meets the physician face-to-face encounter requirements with this patient on this date. The encounter with the patient was in whole or in part for the following MEDICAL CONDITION: (primary reason for Home Healthcare) MEDICAL NECESSITY: I certify, that based on my findings, NURSING services are a medically necessary home health service. HOME BOUND STATUS: I certify that my clinical findings support that this patient is homebound (i.e., Due to illness or injury, pt requires aid of supportive devices such as crutches, cane, wheelchairs, walkers, the use of special transportation or the assistance of another person to leave their place of residence. There is a normal inability to leave the home and doing so requires considerable and taxing effort. Other absences are for medical reasons / religious services and are infrequent or of short duration when for other reasons). If current dressing causes regression in wound condition, may D/C ordered dressing product/s and apply Normal Saline Moist Dressing daily until next Wound Healing Center / Other MD appointment. Notify Wound Healing Center of regression in wound condition at [phone]. If current dressing causes regression in wound condition, may D/C ordered dressing product/s and apply Normal Saline Moist Dressing daily until next Wound Healing Center / Other MD appointment. Notify Wound Gordy, Swayzee N. ([id number]) Healing Center of regression in wound condition at [phone]. Please direct any NON-WOUND related issues/requests for orders to patient's Primary Care Physician Please direct any NON-WOUND related issues/requests for orders to patient's Primary Care Physician Wound #21 Left,Lateral Foot: Continue Home Health Visits - Please give Pedro all the PRN visits that she needs Home Health Nurse may visit PRN to  address patient s wound care needs. FACE TO FACE ENCOUNTER: MEDICARE and MEDICAID PATIENTS: I certify that this patient is under my care and that I had a face-to-face encounter that meets the physician face-to-face encounter requirements with this patient on this date. The encounter with the patient was in whole or in  part for the following MEDICAL CONDITION: (primary reason for Home Healthcare) MEDICAL NECESSITY: I certify, that based on my findings, NURSING services are a medically necessary home health service. HOME BOUND STATUS: I certify that my clinical findings support that this patient is homebound (i.e., Due to illness or injury, pt requires aid of supportive devices such as crutches, cane, wheelchairs, walkers, the use of special transportation or the assistance of another person to leave their place of residence. There is a normal inability to leave the home and doing so requires considerable and taxing effort. Other absences are for medical reasons / religious services and are infrequent or of short duration when for other reasons). If current dressing causes regression in wound condition, may D/C ordered dressing product/s and apply Normal Saline Moist Dressing daily until next Wound Healing Center / Other MD appointment. Notify Wound Healing Center of regression in wound condition at [phone]. Please direct any NON-WOUND related issues/requests for orders to patient's Primary Care Physician Wound #22 Right Ischial Tuberosity: Continue Home Health Visits - Please give Courney all the PRN visits that she needs Home Health Nurse may visit PRN to address patient s wound care needs. FACE TO FACE ENCOUNTER: MEDICARE and MEDICAID PATIENTS: I certify that this patient is under my care and that I had a face-to-face encounter that meets the physician face-to-face encounter requirements with this patient on this date. The encounter with the patient was in whole or in part for  the following MEDICAL CONDITION: (primary reason for Home Healthcare) MEDICAL NECESSITY: I certify, that based on my findings, NURSING services are a medically necessary home health service. HOME BOUND STATUS: I certify that my clinical findings support that this patient is homebound (i.e., Due to illness or injury, pt requires aid of supportive devices such as crutches, cane, wheelchairs, walkers, the use of special transportation or the assistance of another person to leave their place of residence. There is a normal inability to leave the home and doing so requires considerable and taxing effort. Other absences are for medical reasons / religious services and are infrequent or of short duration when for other reasons). If current dressing causes regression in wound condition, may D/C ordered dressing product/s and apply Normal Saline Moist Dressing daily until next Wound Healing Center / Other MD appointment. Notify Wound Healing Center of regression in wound condition at [phone]. Please direct any NON-WOUND related issues/requests for orders to patient's Primary Care Physician Wound #9 Left Calcaneous: Continue Home Health Visits - Please give Jilliane all the PRN visits that she needs Home Health Nurse may visit PRN to address patient s wound care needs. FACE TO FACE ENCOUNTER: MEDICARE and MEDICAID PATIENTS: I certify that this patient is under my care and that I had a face-to-face encounter that meets the physician face-to-face encounter requirements with this patient on this date. The encounter with the patient was in whole or in part for the following MEDICAL CONDITION: (primary reason for Home Healthcare) MEDICAL NECESSITY: I certify, that based on my findings, NURSING services are a medically necessary home health service. HOME BOUND STATUS: I certify that my clinical findings support that this patient is homebound (i.e., Due to illness or injury, pt requires aid of supportive  devices such as crutches, cane, wheelchairs, walkers, the use of special transportation or the assistance of another person to leave their place of residence. There is a normal inability to leave the home and doing so requires considerable and taxing effort. Other absences are for medical  reasons / religious services and are infrequent or of short duration when for other reasons). If current dressing causes regression in wound condition, may D/C ordered dressing product/s and apply Andrzejewski, Carlis N. ([id number]) Normal Saline Moist Dressing daily until next Wound Healing Center / Other MD appointment. Notify Wound Healing Center of regression in wound condition at [phone]. Please direct any NON-WOUND related issues/requests for orders to patient's Primary Care Physician Negative Pressure Wound Therapy: Wound #18 Left Ischial Tuberosity: Wound VAC settings at 125/130 mmHg continuous pressure. Use BLACK/GREEN foam to wound cavity. Use WHITE foam to fill any tunnel/s and/or undermining. Change VAC dressing 3 X WEEK. Change canister as indicated when full. Nurse may titrate settings and frequency of dressing changes as clinically indicated. - place some white foam under the undermining as well as the Prisma place foam around the peri wound Home Health Nurse may d/c VAC for s/s of increased infection, significant wound regression, or uncontrolled drainage. Notify Wound Healing Center at [phone]. The following medication(s) was prescribed: Augmentin oral 500 mg-125 mg tablet 1 1 tablet oral for infection. Given after dialysis on dialysis days o #1 the right popliteal fossa wound is concerning. Culture last week grew Klebsiella resistant to ampicillin and enterococcus. I started her on Augmentin 5/325 one by mouth every 24 after dialysis days. Will need to look at this next week she may require any biotics. #2 the area on the lateral aspect of her left foot and the anterior aspect of  the left foot/ankle is also concerning for ischemia. Although the cancer layer appointment with Dr. Pernell Dupre today I've asked them to rebook this is soon as possible. I'd be glad to phone Dr. Pernell Dupre and see if I can expatriate this if this is necessary #3 areas on her left buttock looks stable. #4 area on the left heel looks stable. 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 a pressure area related to wearing her prosthesis largely while on the toilet. EDITHA, BRIDGEFORTH ([id number]) Electronic Signature(s) Signed: 09/16/2016 5:40:41 PM By: [name] Najjar MD Entered By: [name] on 09/16/2016 17:38:16 [name] ([id number]) -------------------------------------------------------------------------------- Loran Senters Date of Service: 09/16/2016 Patient Name: N. Patient Account Number: [id number] Medical Record Treating RN: [name] [id number] Number: Other Clinician: [birth year] (36 y.o. 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, right lower extremity S71.101A Unspecified open wound, right thigh, initial encounter L89.322 Pressure ulcer of left buttock, stage 2 L89.899 Pressure ulcer of other site, unspecified stage L89.223 Pressure ulcer of left hip,  stage 3 Facility Procedures CPT4: Description Modifier Quantity Code [id number] 11042 - DEB SUBQ TISSUE 20 SQ CM/< 1 ICD-10 Description Diagnosis 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

## 2016-09-30 ENCOUNTER — Encounter: Payer: Medicare Other | Attending: Internal Medicine | Admitting: Internal Medicine

## 2016-09-30 DIAGNOSIS — S71101A Unspecified open wound, right thigh, initial encounter: Secondary | ICD-10-CM | POA: Diagnosis not present

## 2016-09-30 DIAGNOSIS — Z89511 Acquired absence of right leg below knee: Secondary | ICD-10-CM | POA: Insufficient documentation

## 2016-09-30 DIAGNOSIS — L89223 Pressure ulcer of left hip, stage 3: Secondary | ICD-10-CM | POA: Diagnosis not present

## 2016-09-30 DIAGNOSIS — E1052 Type 1 diabetes mellitus with diabetic peripheral angiopathy with gangrene: Secondary | ICD-10-CM | POA: Insufficient documentation

## 2016-09-30 DIAGNOSIS — N186 End stage renal disease: Secondary | ICD-10-CM | POA: Insufficient documentation

## 2016-09-30 DIAGNOSIS — Z992 Dependence on renal dialysis: Secondary | ICD-10-CM | POA: Diagnosis not present

## 2016-09-30 DIAGNOSIS — L89322 Pressure ulcer of left buttock, stage 2: Secondary | ICD-10-CM | POA: Insufficient documentation

## 2016-09-30 DIAGNOSIS — E1022 Type 1 diabetes mellitus with diabetic chronic kidney disease: Secondary | ICD-10-CM | POA: Diagnosis not present

## 2016-09-30 DIAGNOSIS — I70261 Atherosclerosis of native arteries of extremities with gangrene, right leg: Secondary | ICD-10-CM | POA: Diagnosis not present

## 2016-09-30 DIAGNOSIS — X58XXXA Exposure to other specified factors, initial encounter: Secondary | ICD-10-CM | POA: Insufficient documentation

## 2016-09-30 DIAGNOSIS — L89622 Pressure ulcer of left heel, stage 2: Secondary | ICD-10-CM | POA: Diagnosis not present

## 2016-09-30 DIAGNOSIS — T87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, right lower extremity: Secondary | ICD-10-CM | POA: Insufficient documentation

## 2016-09-30 DIAGNOSIS — E10621 Type 1 diabetes mellitus with foot ulcer: Secondary | ICD-10-CM | POA: Diagnosis not present

## 2016-09-30 DIAGNOSIS — I70245 Atherosclerosis of native arteries of left leg with ulceration of other part of foot: Secondary | ICD-10-CM | POA: Diagnosis not present

## 2016-10-01 NOTE — Progress Notes (Addendum)
MAKIRA, HOLLEMAN ([id number]) Visit Report for 09/30/2016 Arrival Information Details MATALYNN, GRAFF Date of Service: 09/30/2016 3:30 PM Patient Name: N. Patient Account Number: [id number] Medical Record Treating RN: [name] [id number] Number: Other Clinician: 04-Jun-1981 (36 y.o. Treating [name], [name] Date of Birth/Sex: Female) Physician/Extender: G Primary Care [name] Physician: Referring Physician: [name] in Treatment: 77 Visit Information History Since Last Visit Added or deleted any medications: No Patient Arrived: Wheel Chair Any new allergies or adverse reactions: No Arrival Time: 15:46 Had a fall or experienced change in No activities of daily living that may affect Accompanied By: mother risk of falls: Transfer Assistance: Manual Signs or symptoms of abuse/neglect since last No Patient Identification Verified: Yes visito Secondary Verification Process Yes Hospitalized since last visit: No Completed: Pain Present Now: No Patient Requires Transmission-Based No Precautions: Patient Has Alerts: Yes Electronic Signature(s) Signed: 09/30/2016 6:00:22 PM By: [name] Entered By: [name] on 09/30/2016 15:48:53 [name] ([id number]) -------------------------------------------------------------------------------- Encounter Discharge Information Details [name] Date of Service: 09/30/2016 3:30 PM Patient Name: N. Patient Account Number: [id number] Medical Record Treating RN: [name] [id number] Number: Other Clinician: Apr 26, 1981 (35 y.o. Treating [name], [name] Date of Birth/Sex: Female) Physician/Extender: G Primary Care [name] Physician: Referring Physician: [name] in Treatment: 78 Encounter Discharge Information Items Schedule Follow-up Appointment: No Medication Reconciliation completed No and provided to Patient/Care Suetta Hoffmeister: Provided on Clinical Summary of  Care: 09/30/2016 Form Type Recipient Paper Patient HB Electronic Signature(s) Signed: 09/30/2016 5:19:20 PM By: Gwenlyn Perking Entered By: Gwenlyn Perking on 09/30/2016 17:19:20 [name] ([id number]) -------------------------------------------------------------------------------- Multi Wound Chart Details [name] Date of Service: 09/30/2016 3:30 PM Patient Name: N. Patient Account Number: [id number] Medical Record Treating RN: [name] [id number] Number: Other Clinician: 1980-11-21 (35 y.o. 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] [17:Left Amputation Site - Left Ischial Tuberosity Transmetatarsal] Wounding Event: [16:Pressure Injury] [17:Surgical Injury] [18:Pressure Injury] Primary Etiology: [16:Pressure Ulcer] [17:Open Surgical Wound] [18:Pressure Ulcer] Comorbid History: [16:Anemia, Type I Diabetes, Anemia, Type I Diabetes, Anemia, Type I Diabetes, End Stage Renal Disease, End Stage Renal Disease, End Stage Renal Disease, Rheumatoid Arthritis, Neuropathy] [17:Rheumatoid Arthritis, Rheumatoid Arthritis,  Neuropathy] [18:Neuropathy] Date Acquired: [16:04/02/2016] [17:04/13/2016] [18:05/12/2016] Weeks of Treatment: [16:25] [17:22] [18:17] Wound Status: [16:Open] [17:Open] [18:Open] Measurements L x W x D 4.5x5.2x0.3 [17:1x0.7x0.2] [18:1.5x2.5x1.1] (cm) Area (cm) : [16:18.378] [17:0.55] [18:2.945] Volume (cm) : [16:5.513] [17:0.11] [18:3.24] % Reduction in Area: [16:-209.50%] [17:34.50%] [18:78.10%] % Reduction in Volume: -828.10%  [17:-31.00%] [18:-141.30%] Classification: [16:Category/Stage IV] [17:Full Thickness Without Category/Stage III Exposed Support Structures] HBO Classification: [16:Grade 1] [17:Grade 1] [18:N/A] Exudate Amount: [16:Large] [17:Large] [18:Large] Exudate Type: [16:Purulent] [17:Serous] [18:Serosanguineous] Exudate Color: [16:yellow, brown, green] [17:amber] [18:red, brown] Wound Margin: [16:Distinct, outline attached Flat and Intact] [18:Flat and Intact] Granulation Amount: Medium (34-66%) Small (1-33%) Large (67-100%) Granulation Quality: Pink Pink Pink Necrotic Amount: Medium (34-66%) Large (67-100%) Small (1-33%) Necrotic Tissue: Eschar, Adherent Slough Adherent [employer] Exposed Structures: Fat: Yes Fat: Yes Fascia: No Tendon: Yes Fascia: No Fat: No Fascia: No Tendon: No Tendon: No Muscle: No Muscle: No Muscle: No Joint: No Joint: No Joint: No Bone: No Bone: No Bone: No Limited to Skin Breakdown  Epithelialization: None None Small (1-33%) Periwound Skin Texture: Edema: Yes Edema: No Edema: No Induration: Yes Excoriation: No Excoriation: No Excoriation: No Induration: No Induration: No Callus: No Callus: No Callus: No Crepitus: No Crepitus: No Crepitus: No Fluctuance: No Fluctuance: No Fluctuance: No Friable: No Friable: No Friable: No Rash: No Rash: No Rash: No Scarring: No Scarring: No Scarring: No Periwound Skin Moist: Yes Moist: Yes Moist: Yes Moisture: Maceration: No Maceration: No Maceration: No Dry/Scaly: No Dry/Scaly: No Dry/Scaly: No Periwound Skin Color: Erythema: Yes Ecchymosis: Yes Erythema: Yes Atrophie Blanche: No Atrophie Blanche: No Atrophie Blanche: No Cyanosis: No Cyanosis: No Cyanosis: No Ecchymosis: No Erythema: No Ecchymosis: No Hemosiderin Staining: No Hemosiderin Staining: No Hemosiderin Staining: No Mottled: No Mottled: No Mottled: No Pallor: No Pallor: No Pallor: No Rubor: No Rubor: No Rubor:  No Erythema Location: Circumferential N/A Circumferential Erythema Change: No Change N/A N/A Temperature: No Abnormality N/A N/A Tenderness on Yes No No Palpation: Wound Preparation: Ulcer Cleansing: Ulcer Cleansing: Ulcer Cleansing: Rinsed/Irrigated with Rinsed/Irrigated with Rinsed/Irrigated with Saline Saline Saline Topical Anesthetic Topical Anesthetic Topical Anesthetic Applied: Other: lidocaine Applied: Other: lidocaine Applied: Other: lidocaine 4% 4% 4% Wound Number: [id number]  Photos: No Photos No Photos No Photos Wound Location: Left Foot - Dorsal Left Foot - Lateral Right Ischial Tuberosity Wounding Event: Pressure Injury Pressure Injury Pressure Injury Grose, Zyia N. ([id number]) Primary Etiology: Pressure Ulcer Pressure Ulcer Pressure Ulcer Comorbid History: Anemia, Type I Diabetes, Anemia, Type I Diabetes, Anemia, Type I Diabetes, End Stage Renal Disease, End Stage Renal Disease, End Stage Renal Disease, Rheumatoid Arthritis, Rheumatoid Arthritis, Rheumatoid Arthritis, Neuropathy Neuropathy Neuropathy Date Acquired: 08/06/2016 08/06/2016 09/07/2016 Weeks of Treatment: 3 3 3  Wound Status: Open Open Open Measurements L x W x D 3.5x2.7x0.1 1.2x2.2x0.1 1.1x2.3x0.1 (cm) Area (cm) : 7.422 2.073 1.987 Volume (cm) : 0.742 0.207 0.199 % Reduction in Area: -337.60% -32.00% -31.80% % Reduction in Volume: -336.50% -31.80% -31.80% Classification: Unstageable/Unclassified Unstageable/Unclassified Category/Stage II HBO Classification: Grade 2 Grade 1 N/A Exudate Amount: None Present None Present Medium Exudate Type: N/A N/A Serous Exudate Color: N/A N/A amber Wound Margin: Flat and Intact Flat and Intact Flat and Intact Granulation Amount: None Present (0%) None Present (0%) None Present (0%) Granulation Quality: N/A N/A N/A Necrotic Amount: Large (67-100%) Large (67-100%) Large (67-100%) Necrotic Tissue: Eschar Eschar Adherent Slough Exposed Structures: Fascia:  No Fascia: No Fascia: No Fat: No Fat: No Fat: No Tendon: No Tendon: No Tendon: No Muscle: No Muscle: No Muscle: No Joint: No Joint: No Joint: No Bone: No Bone: No Bone: No Limited to Skin Limited to Skin Limited to Skin Breakdown Breakdown Breakdown Epithelialization: None None None Periwound Skin Texture: Edema: No Induration: Yes Edema: No Excoriation: No Edema: No Excoriation: No Induration: No Excoriation: No Induration: No Callus: No Callus: No Callus: No Crepitus: No Crepitus: No Crepitus: No Fluctuance: No Fluctuance: No Fluctuance: No Friable: No Friable: No Friable: No Rash: No Rash: No Rash: No Scarring: No Scarring: No Scarring: No Periwound Skin Maceration: No Maceration: No Maceration: No Moisture: Moist: No Moist: No Moist: No Dry/Scaly: No Dry/Scaly: No Dry/Scaly: No Periwound Skin Color: Atrophie Blanche: No Ecchymosis: Yes Ecchymosis: Yes Cyanosis: No Atrophie Blanche: No Atrophie Blanche: No Ecchymosis: No Cyanosis: No Cyanosis: No Erythema: No Erythema: No Erythema: No Hemosiderin Staining: No Hemosiderin Staining: No Hemosiderin Staining: No Derusha, Kaelen N. ([id number]) Mottled: No Mottled: No Mottled: No Pallor: No Pallor: No Pallor: No Rubor: No Rubor: No Rubor: No Erythema Location: N/A N/A N/A Erythema  Change: N/A N/A N/A Temperature: N/A N/A N/A Tenderness on No No No Palpation: Wound Preparation: Ulcer Cleansing: Ulcer Cleansing: Ulcer Cleansing: Rinsed/Irrigated with Rinsed/Irrigated with Rinsed/Irrigated with Saline Saline Saline Topical Anesthetic Topical Anesthetic Topical Anesthetic Applied: Other: lidocaine Applied: Other: lidocaine Applied: Other: lidocaine 4% 4% 4% Wound Number: 9 N/A N/A Photos: No Photos N/A N/A Wound Location: Left Calcaneous N/A N/A Wounding Event: Pressure Injury N/A N/A Primary Etiology: 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 Comorbid History:  Anemia, Type I Diabetes, N/A N/A End Stage Renal Disease, Rheumatoid Arthritis, Neuropathy Date Acquired: 07/28/2015 N/A N/A Weeks of Treatment: 56 N/A N/A Wound Status: Open N/A N/A Measurements L x W x D 3x3.2x0.1 N/A N/A (cm) Area (cm) : 7.54 N/A N/A Volume (cm) : 0.754 N/A N/A % Reduction in Area: 59.40% N/A N/A % Reduction in Volume: 59.40% N/A N/A Classification: Grade 2 N/A N/A HBO Classification: N/A N/A N/A Exudate Amount: Large N/A N/A Exudate Type: Serosanguineous N/A N/A Exudate Color: red, brown N/A N/A Wound Margin: Flat and Intact N/A N/A Granulation Amount: Medium (34-66%) N/A N/A Granulation Quality: Red N/A N/A Necrotic Amount: Medium (34-66%) N/A N/A Necrotic Tissue: Eschar, Adherent Slough N/A N/A Exposed Structures: Fat: Yes N/A N/A Fascia: No Tendon: No Muscle: No [place], Aphrodite N. ([id number]) Joint: No Bone: No Epithelialization: None N/A N/A Periwound Skin Texture: Edema: No N/A N/A Excoriation: No Induration: No Callus: No Crepitus: No Fluctuance: No Friable: No Rash: No Scarring: No Periwound Skin Moist: Yes N/A N/A Moisture: Maceration: No Dry/Scaly: No Periwound Skin Color: Atrophie Blanche: No N/A N/A Cyanosis: No Ecchymosis: No Erythema: No Hemosiderin Staining: No Mottled: No Pallor: No Rubor: No Erythema Location: N/A N/A N/A Erythema Change: N/A N/A N/A Temperature: N/A N/A N/A Tenderness on No N/A N/A Palpation: Wound Preparation: Ulcer Cleansing: N/A N/A Rinsed/Irrigated with Saline Topical Anesthetic Applied: Other: lidocaine 4% Treatment Notes Electronic Signature(s) Signed: 09/30/2016 6:00:22 PM By: [name] Entered By: [name] on 09/30/2016 16:34:04 [name] ([id number]) -------------------------------------------------------------------------------- Multi-Disciplinary Care Plan Details [name] Date of Service: 09/30/2016 3:30 PM Patient Name: N. Patient Account  Number: [id number] Medical Record Treating RN: [name] [id number] Number: Other Clinician: July 08, 1981 (35 y.o. Treating [name], [name] Date of Birth/Sex: Female) Physician/Extender: G Primary Care [name] Physician: Referring Physician: [name] in Treatment: 53 Active Inactive HBO Nursing Diagnoses: Anxiety related to feelings of confinement associated with the hyperbaric oxygen chamber Anxiety related to knowledge deficit of hyperbaric oxygen therapy and treatment procedures Discomfort related to temperature and humidity changes inside hyperbaric chamber Potential for barotraumas to ears, sinuses, teeth, and lungs or cerebral gas embolism related to changes in atmospheric pressure inside hyperbaric oxygen chamber Potential for oxygen toxicity seizures related to delivery of 100% oxygen at an increased atmospheric pressure Potential for pulmonary oxygen toxicity related to delivery of 100% oxygen at an increased atmospheric pressure Goals: Barotrauma will be prevented during HBO2 Date Initiated: 08/30/2015 Goal Status: Active Patient and/or family will be able to state/discuss factors appropriate to the management of their disease process during treatment Date Initiated: 08/30/2015 Goal Status: Active Patient will tolerate the hyperbaric oxygen therapy treatment Date Initiated: 08/30/2015 Goal Status: Active Patient will tolerate the internal climate of the chamber Date Initiated: 08/30/2015 Goal Status: Active Patient/caregiver will verbalize understanding of HBO goals, rationale, procedures and potential hazards Date Initiated: 08/30/2015 Goal Status: Active Signs and symptoms of pulmonary oxygen toxicity will be recognized and promptly addressed Date Initiated: 08/30/2015 AISHAH, TEFFETELLER ([id number]) Goal  Status: Active Signs and symptoms of seizure will be recognized and promptly addressed ; seizing patients will suffer no harm Date  Initiated: 08/30/2015 Goal Status: Active Interventions: Administer a five (5) minute air break for patient if signs and symptoms of seizure appear and notify the hyperbaric physician Administer a ten (10) minute air break for patient if signs and symptoms of seizure appear and notify the hyperbaric physician Administer decongestants, per physician orders, prior to HBO2 Administer the correct therapeutic gas delivery based on the patients needs and limitations, per physician order Assess and provide for patientos comfort related to the hyperbaric environment and equalization of middle ear Assess for signs and symptoms related to adverse events, including but not limited to confinement anxiety, pneumothorax, oxygen toxicity and baurotrauma Assess patient for any history of confinement anxiety Assess patient's knowledge and expectations regarding hyperbaric medicine and provide education related to the hyperbaric environment, goals of treatment and prevention of adverse events Implement protocols to decrease risk of pneumothorax in high risk patients Notes: Abuse / Safety / Falls / Self Care Management Nursing Diagnoses: Potential for falls Goals: Patient will remain injury free Date Initiated: 09/19/2015 Goal Status: Active Patient/caregiver will verbalize/demonstrate measures taken to prevent injury and/or falls Date Initiated: 09/19/2015 Goal Status: Active Interventions: Assess fall risk on admission and as needed Assess impairment of mobility on admission and as needed per policy Notes: Orientation to the Wound Care Program Nursing Diagnoses: BRYANNAH, BOSTON ([id number]) Knowledge deficit related to the wound healing center program Goals: Patient/caregiver will verbalize understanding of the Wound Healing Center Program Date Initiated: 08/30/2015 Goal Status: Active Interventions: Provide education on orientation to the wound center Notes: Pressure Nursing  Diagnoses: Knowledge deficit related to causes and risk factors for pressure ulcer development Knowledge deficit related to management of pressures ulcers Potential for impaired tissue integrity related to pressure, friction, moisture, and shear Goals: Patient will remain free from development of additional pressure ulcers Date Initiated: 08/30/2015 Goal Status: Active Patient will remain free of pressure ulcers Date Initiated: 08/30/2015 Goal Status: Active Patient/caregiver will verbalize risk factors for pressure ulcer development Date Initiated: 08/30/2015 Goal Status: Active Patient/caregiver will verbalize understanding of pressure ulcer management Date Initiated: 08/30/2015 Goal Status: Active Interventions: Assess: immobility, friction, shearing, incontinence upon admission and as needed Assess offloading mechanisms upon admission and as needed Assess potential for pressure ulcer upon admission and as needed Provide education on pressure ulcers Treatment Activities: Patient referred for home evaluation of offloading devices/mattresses : 01/15/2016 Patient referred for pressure reduction/relief devices : 01/15/2016 Pressure reduction/relief device ordered : 01/15/2016 Notes: OCIA, SIMEK ([id number]) Wound/Skin Impairment Nursing Diagnoses: Impaired tissue integrity Knowledge deficit related to ulceration/compromised skin integrity Goals: Patient will have a decrease in wound volume by X% from date: (specify in notes) Date Initiated: 08/30/2015 Goal Status: Active Patient/caregiver will verbalize understanding of skin care regimen Date Initiated: 08/30/2015 Goal Status: Active Ulcer/skin breakdown will have a volume reduction of 30% by week 4 Date Initiated: 08/30/2015 Goal Status: Active Ulcer/skin breakdown will have a volume reduction of 50% by week 8 Date Initiated: 08/30/2015 Goal Status: Active Ulcer/skin breakdown will have a volume reduction of 80% by week  12 Date Initiated: 08/30/2015 Goal Status: Active Ulcer/skin breakdown will heal within 14 weeks Date Initiated: 08/30/2015 Goal Status: Active Interventions: Assess patient/caregiver ability to obtain necessary supplies Assess patient/caregiver ability to perform ulcer/skin care regimen upon admission and as needed Assess ulceration(s) every visit Provide education on ulcer and skin care Notes: Electronic Signature(s) Signed:  09/30/2016 6:00:22 PM By: [name] Entered By: [name] on 09/30/2016 16:33:57 [name] ([id number]) -------------------------------------------------------------------------------- Pain Assessment Details [name] Date of Service: 09/30/2016 3:30 PM Patient Name: N. Patient Account Number: [id number] Medical Record Treating RN: [name] [id number] Number: Other Clinician: 11/01/1980 (35 y.o. Treating [name], [name] Date of Birth/Sex: Female) Physician/Extender: G Primary Care [name] Physician: Referring Physician: [name] in Treatment: 75 Active Problems Location of Pain Severity and Description of Pain Patient Has Paino Yes Site Locations Pain Location: Pain in Ulcers With Dressing Change: Yes Duration of the Pain. Constant / Intermittento Constant Pain Management and Medication Current Pain Management: Electronic Signature(s) Signed: 09/30/2016 6:00:22 PM By: [name] Entered By: [name] on 09/30/2016 15:49:12 [name] ([id number]) -------------------------------------------------------------------------------- Wound Assessment Details [name] Date of Service: 09/30/2016 3:30 PM Patient Name: N. Patient Account Number: [id number] Medical Record Treating RN: [name] [id number] Number: Other Clinician: 06-12-1981 (35 y.o. 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 Wound Open Status: Wounding Event: Pressure Injury Comorbid Anemia, Type I Diabetes, End Stage Date Acquired: 04/02/2016 History: Renal Disease, Rheumatoid Arthritis, Weeks Of Treatment: 25 Neuropathy Clustered Wound: No Photos Wound Measurements Length: (cm) 4.5 Width: (cm) 5.2 Depth: (cm) 0.3 Area: (cm) 18.378 Volume: (cm) 5.513 % Reduction in Area: -209.5% % Reduction in Volume: -828.1% Epithelialization: None Tunneling: No Undermining: No Wound Description Classification: Category/Stage IV Diabetic Severity [address]): Grade 1 Wound Margin: Distinct, outline attach Exudate Amount: Large Exudate Type: Purulent Exudate Color: yellow, brown, green Foul Odor After Cleansing: No ed Wound Bed Forney, Kyah N. ([id number]) Granulation Amount: Medium (34-66%) Exposed Structure Granulation Quality: Pink Fascia Exposed: No Necrotic Amount: Medium (34-66%) Fat Layer Exposed: Yes Necrotic Quality: Eschar, Adherent Slough Tendon Exposed: Yes Muscle Exposed: No Joint Exposed: No Bone Exposed: No Periwound Skin Texture Texture Color No Abnormalities Noted: No No Abnormalities Noted: No Callus: No Atrophie Blanche: No Crepitus: No Cyanosis: No Excoriation: No Ecchymosis: No Fluctuance: No Erythema: Yes Friable: No Erythema Location: Circumferential Induration: Yes Erythema Change: No Change Localized Edema: Yes Hemosiderin Staining: No Rash: No Mottled: No Scarring: No Pallor: No Rubor: No Moisture No Abnormalities Noted: No Temperature / Pain Dry / Scaly: No Temperature: No Abnormality Maceration: No Tenderness on Palpation: Yes Moist: Yes Wound Preparation Ulcer Cleansing: Rinsed/Irrigated with Saline Topical Anesthetic Applied: Other: lidocaine 4%, Electronic  Signature(s) Signed: 10/01/2016 3:52:15 PM By: [name] Previous Signature: 09/30/2016 6:00:22 PM Version By: [name] Entered By: [name] on 10/01/2016 09:06:21 [name] ([id number]) -------------------------------------------------------------------------------- Wound Assessment Details [name] Date of Service: 09/30/2016 3:30 PM Patient Name: N. Patient Account Number: [id number] Medical Record Treating RN: [name] [id number] Number: Other Clinician: Sep 09, 1981 (35 y.o. Treating [name], [name] Date of Birth/Sex: Female) Physician/Extender: G Primary Care [name] Physician: Referring Physician: [name] in Treatment: 39 Wound Status Wound Number: 17 Primary Open Surgical Wound Etiology: Wound Location: Left Amputation Site - Transmetatarsal Wound Open Status: Wounding Event: Surgical Injury Comorbid Anemia, Type I Diabetes, End Stage Date Acquired: 04/13/2016 History: Renal Disease, Rheumatoid Arthritis, Weeks Of Treatment: 22 Neuropathy Clustered Wound: No Photos Wound Measurements Length: (cm) 1 Width: (cm) 0.7 Depth: (cm) 0.2 Area: (cm) 0.55 Volume: (cm) 0.11 % Reduction in Area: 34.5% % Reduction in Volume: -31% Epithelialization: None Tunneling: No  Undermining: No Wound Description Full Thickness Without Foul Odor After Classification: Exposed Support Structures Diabetic Severity Grade 1 (Wagner): Wound Margin: Flat and Intact Exudate Amount: Large Exudate Type: Serous Exudate Color: amber Tonkinson, Derrick N. ([id number]) Cleansing: No Wound Bed Granulation Amount: Small (1-33%) Exposed Structure Granulation Quality: Pink Fascia Exposed: No Necrotic Amount: Large (67-100%) Fat Layer Exposed: Yes Necrotic Quality: Adherent Slough Tendon Exposed: No Muscle Exposed: No Joint Exposed: No Bone Exposed: No Periwound Skin Texture Texture Color No Abnormalities Noted: No No  Abnormalities Noted: No Callus: No Atrophie Blanche: No Crepitus: No Cyanosis: No Excoriation: No Ecchymosis: Yes Fluctuance: No Erythema: No Friable: No Hemosiderin Staining: No Induration: No Mottled: No Localized Edema: No Pallor: No Rash: No Rubor: No Scarring: No Moisture No Abnormalities Noted: No Dry / Scaly: No Maceration: No Moist: Yes Wound Preparation Ulcer Cleansing: Rinsed/Irrigated with Saline Topical Anesthetic Applied: Other: lidocaine 4%, Electronic Signature(s) Signed: 10/01/2016 3:52:15 PM By: [name] Previous Signature: 09/30/2016 6:00:22 PM Version By: [name] Entered By: [name] on 10/01/2016 09:06:51 [name] ([id number]) -------------------------------------------------------------------------------- Wound Assessment Details [name] Date of Service: 09/30/2016 3:30 PM Patient Name: N. Patient Account Number: [id number] Medical Record Treating RN: [name] [id number] Number: Other Clinician: [birth year] (35 y.o. Treating [name], [name] Date of Birth/Sex: Female) Physician/Extender: G Primary Care [name] Physician: Referring Physician: [name] in Treatment: 83 Wound Status Wound Number: 18 Primary Pressure Ulcer Etiology: Wound Location: Left Ischial Tuberosity Wound Open Wounding Event: Pressure Injury Status: Date Acquired: 05/12/2016 Comorbid Anemia, Type I Diabetes, End Stage Weeks Of Treatment: 17 History: Renal Disease, Rheumatoid Arthritis, Clustered Wound: No Neuropathy Photos Wound Measurements Length: (cm) 1.5 Width: (cm) 2.5 Depth: (cm) 1.1 Area: (cm) 2.945 Volume: (cm) 3.24 % Reduction in Area: 78.1% % Reduction in Volume: -141.3% Epithelialization: Small (1-33%) Tunneling: No Undermining: No Wound Description Classification: Category/Stage III Wound Margin: Flat and Intact Exudate Amount: Large Exudate Type: Serosanguineous Exudate Color:  red, brown Wound Bed Granulation Amount: Large (67-100%) Exposed Structure Munch, Ahmani N. ([id number]) Granulation Quality: Pink Fascia Exposed: No Necrotic Amount: Small (1-33%) Fat Layer Exposed: No Necrotic Quality: Adherent Slough Tendon Exposed: No Muscle Exposed: No Joint Exposed: No Bone Exposed: No Limited to Skin Breakdown Periwound Skin Texture Texture Color No Abnormalities Noted: No No Abnormalities Noted: No Callus: No Atrophie Blanche: No Crepitus: No Cyanosis: No Excoriation: No Ecchymosis: No Fluctuance: No Erythema: Yes Friable: No Erythema Location: Circumferential Induration: No Hemosiderin Staining: No Localized Edema: No Mottled: No Rash: No Pallor: No Scarring: No Rubor: No Moisture No Abnormalities Noted: No Dry / Scaly: No Maceration: No Moist: Yes Wound Preparation Ulcer Cleansing: Rinsed/Irrigated with Saline Topical Anesthetic Applied: Other: lidocaine 4%, Electronic Signature(s) Signed: 10/01/2016 3:52:15 PM By: [name] Previous Signature: 09/30/2016 6:00:22 PM Version By: [name] Entered By: [name] on 10/01/2016 09:08:01 [name] ([id number]) -------------------------------------------------------------------------------- Wound Assessment Details [name] Date of Service: 09/30/2016 3:30 PM Patient Name: N. Patient Account Number: [id number] Medical Record Treating RN: [name] [id number] Number: Other Clinician: [birth year] (35 y.o. Treating [name], [name] Date of Birth/Sex: Female) Physician/Extender: G Primary Care [name] Physician: Referring Physician: [name] in Treatment: 54 Wound Status Wound Number: 20 Primary Pressure Ulcer Etiology: Wound Location: Left Foot - Dorsal Wound Open Wounding Event: Pressure Injury Status: Date Acquired: 08/06/2016 Comorbid Anemia, Type I Diabetes, End Stage Weeks Of Treatment: 3 History: Renal Disease,  Rheumatoid Arthritis, Clustered Wound: No Neuropathy Photos Wound Measurements Length: (cm) 3.5 Width: (cm) 2.7  Depth: (cm) 0.1 Area: (cm) 7.422 Volume: (cm) 0.742 % Reduction in Area: -337.6% % Reduction in Volume: -336.5% Epithelialization: None Tunneling: No Undermining: No Wound Description Classification: Unstageable/Unclassified Foul Od Diabetic Severity (Wagner): Grade 2 Wound Margin: Flat and Intact Exudate Amount: None Present or After Cleansing: No Wound Bed Granulation Amount: None Present (0%) Exposed Structure Necrotic Amount: Large (67-100%) Fascia Exposed: No Ficco, Reyonna N. ([id number]) Necrotic Quality: Eschar Fat Layer Exposed: No Tendon Exposed: No Muscle Exposed: No Joint Exposed: No Bone Exposed: No Limited to Skin Breakdown Periwound Skin Texture Texture Color No Abnormalities Noted: No No Abnormalities Noted: No Callus: No Atrophie Blanche: No Crepitus: No Cyanosis: No Excoriation: No Ecchymosis: No Fluctuance: No Erythema: No Friable: No Hemosiderin Staining: No Induration: No Mottled: No Localized Edema: No Pallor: No Rash: No Rubor: No Scarring: No Moisture No Abnormalities Noted: No Dry / Scaly: No Maceration: No Moist: No Wound Preparation Ulcer Cleansing: Rinsed/Irrigated with Saline Topical Anesthetic Applied: Other: lidocaine 4%, Electronic Signature(s) Signed: 10/01/2016 3:52:15 PM By: [name] Previous Signature: 09/30/2016 6:00:22 PM Version By: [name] Entered By: [name] on 10/01/2016 09:08:33 [name] ([id number]) -------------------------------------------------------------------------------- Wound Assessment Details [name] Date of Service: 09/30/2016 3:30 PM Patient Name: N. Patient Account Number: [id number] Medical Record Treating RN: [name] [id number] Number: Other Clinician: 1981-06-06 (35 y.o. Treating [name], [name] Date of  Birth/Sex: Female) Physician/Extender: G Primary Care [name] Physician: Referring Physician: [name] in Treatment: 79 Wound Status Wound Number: 21 Primary Pressure Ulcer Etiology: Wound Location: Left Foot - Lateral Wound Open Wounding Event: Pressure Injury Status: Date Acquired: 08/06/2016 Comorbid Anemia, Type I Diabetes, End Stage Weeks Of Treatment: 3 History: Renal Disease, Rheumatoid Arthritis, Clustered Wound: No Neuropathy Photos Wound Measurements Length: (cm) 1.2 Width: (cm) 2.2 Depth: (cm) 0.1 Area: (cm) 2.073 Volume: (cm) 0.207 % Reduction in Area: -32% % Reduction in Volume: -31.8% Epithelialization: None Tunneling: No Undermining: No Wound Description Classification: Unstageable/Unclassified Foul Od Diabetic Severity (Wagner): Grade 1 Wound Margin: Flat and Intact Exudate Amount: None Present or After Cleansing: No Wound Bed Granulation Amount: None Present (0%) Exposed Structure Necrotic Amount: Large (67-100%) Fascia Exposed: No Abee, Galia N. ([id number]) Necrotic Quality: Eschar Fat Layer Exposed: No Tendon Exposed: No Muscle Exposed: No Joint Exposed: No Bone Exposed: No Limited to Skin Breakdown Periwound Skin Texture Texture Color No Abnormalities Noted: No No Abnormalities Noted: No Callus: No Atrophie Blanche: No Crepitus: No Cyanosis: No Excoriation: No Ecchymosis: Yes Fluctuance: No Erythema: No Friable: No Hemosiderin Staining: No Induration: Yes Mottled: No Localized Edema: No Pallor: No Rash: No Rubor: No Scarring: No Moisture No Abnormalities Noted: No Dry / Scaly: No Maceration: No Moist: No Wound Preparation Ulcer Cleansing: Rinsed/Irrigated with Saline Topical Anesthetic Applied: Other: lidocaine 4%, Electronic Signature(s) Signed: 10/01/2016 3:52:15 PM By: [name] Previous Signature: 09/30/2016 6:00:22 PM Version By: [name] Entered By: [name] on  10/01/2016 09:09:45 [name] ([id number]) -------------------------------------------------------------------------------- Wound Assessment Details [name] Date of Service: 09/30/2016 3:30 PM Patient Name: N. Patient Account Number: [id number] Medical Record Treating RN: [name] [id number] Number: Other Clinician: 01/10/1981 (35 y.o. Treating [name], [name] Date of Birth/Sex: Female) Physician/Extender: G Primary Care [name] Physician: Referring Physician: [name] in Treatment: 68 Wound Status Wound Number: 22 Primary Pressure Ulcer Etiology: Wound Location: Right Ischial Tuberosity Wound Open Wounding Event: Pressure Injury Status: Date Acquired: 09/07/2016 Comorbid Anemia, Type I Diabetes, End Stage Weeks Of Treatment: 3 History: Renal Disease, Rheumatoid Arthritis, Clustered Wound:  No Neuropathy Photos Wound Measurements Length: (cm) 1.1 Width: (cm) 2.3 Depth: (cm) 0.1 Area: (cm) 1.987 Volume: (cm) 0.199 % Reduction in Area: -31.8% % Reduction in Volume: -31.8% Epithelialization: None Tunneling: No Undermining: No Wound Description Classification: Category/Stage II Foul Odor After Cl Wound Margin: Flat and Intact Exudate Amount: Medium Exudate Type: Serous Exudate Color: amber eansing: No Wound Bed Granulation Amount: None Present (0%) Exposed Structure Bonito, Keileigh N. ([id number]) Necrotic Amount: Large (67-100%) Fascia Exposed: No Necrotic Quality: Adherent Slough Fat Layer Exposed: No Tendon Exposed: No Muscle Exposed: No Joint Exposed: No Bone Exposed: No Limited to Skin Breakdown Periwound Skin Texture Texture Color No Abnormalities Noted: No No Abnormalities Noted: No Callus: No Atrophie Blanche: No Crepitus: No Cyanosis: No Excoriation: No Ecchymosis: Yes Fluctuance: No Erythema: No Friable: No Hemosiderin Staining: No Induration: No Mottled: No Localized Edema: No Pallor:  No Rash: No Rubor: No Scarring: No Moisture No Abnormalities Noted: No Dry / Scaly: No Maceration: No Moist: No Wound Preparation Ulcer Cleansing: Rinsed/Irrigated with Saline Topical Anesthetic Applied: Other: lidocaine 4%, Electronic Signature(s) Signed: 10/01/2016 3:52:15 PM By: [name] Previous Signature: 09/30/2016 6:00:22 PM Version By: [name] Entered By: [name] on 10/01/2016 09:10:27 [name] ([id number]) -------------------------------------------------------------------------------- Wound Assessment Details [name] Date of Service: 09/30/2016 3:30 PM Patient Name: N. Patient Account Number: [id number] Medical Record Treating RN: [name] [id number] Number: Other Clinician: 1980/11/21 (35 y.o. Treating [name], [name] Date of Birth/Sex: Female) Physician/Extender: G Primary Care [name] Physician: Referring Physician: [name] in Treatment: 56 Wound Status Wound Number: 9 Primary Diabetic Wound/Ulcer of the Lower Etiology: Extremity Wound Location: Left Calcaneous Wound Open Wounding Event: Pressure Injury Status: Date Acquired: 07/28/2015 Comorbid Anemia, Type I Diabetes, End Stage Weeks Of Treatment: 56 History: Renal Disease, Rheumatoid Arthritis, Clustered Wound: No Neuropathy Photos Wound Measurements Length: (cm) 3 % Reduction in Area Width: (cm) 3.2 % Reduction in Volu Depth: (cm) 0.1 Epithelialization: Area: (cm) 7.54 Tunneling: Volume: (cm) 0.754 Undermining: : 59.4% me: 59.4% None No No Wound Description Classification: Grade 2 Foul Odor After Cl Wound Margin: Flat and Intact Exudate Amount: Large Exudate Type: Serosanguineous Exudate Color: red, brown eansing: No Wound Bed Granulation Amount: Medium (34-66%) Exposed Structure Hasan, Carlesha N. ([id number]) Granulation Quality: Red Fascia Exposed: No Necrotic Amount: Medium (34-66%) Fat Layer Exposed:  Yes Necrotic Quality: Eschar, Adherent Slough Tendon Exposed: No Muscle Exposed: No Joint Exposed: No Bone Exposed: No Periwound Skin Texture Texture Color No Abnormalities Noted: No No Abnormalities Noted: No Callus: No Atrophie Blanche: No Crepitus: No Cyanosis: No Excoriation: No Ecchymosis: No Fluctuance: No Erythema: No Friable: No Hemosiderin Staining: No Induration: No Mottled: No Localized Edema: No Pallor: No Rash: No Rubor: No Scarring: No Moisture No Abnormalities Noted: No Dry / Scaly: No Maceration: No Moist: Yes Wound Preparation Ulcer Cleansing: Rinsed/Irrigated with Saline Topical Anesthetic Applied: Other: lidocaine 4%, Electronic Signature(s) Signed: 10/01/2016 3:52:15 PM By: [name] Previous Signature: 09/30/2016 6:00:22 PM Version By: [name] Entered By: [name] on 10/01/2016 09:11:31 [name] ([id number]) -------------------------------------------------------------------------------- Vitals Details [name] Date of Service: 09/30/2016 3:30 PM Patient Name: N. Patient Account Number: [id number] Medical Record Treating RN: [name] [id number] Number: Other Clinician: Feb 04, 1981 (35 y.o. Treating [name], [name] Date of Birth/Sex: Female) Physician/Extender: G Primary Care [name] Physician: Referring Physician: [name] in Treatment: 61 Vital Signs Time Taken: 15:49 Temperature (F): 98.5 Height (in): 68 Pulse (bpm): 71 Weight (lbs): 96 Respiratory Rate (breaths/min): 16 Body Mass Index (  BMI): 14.6 Blood Pressure (mmHg): 126/73 Reference Range: 80 - 120 mg / dl Electronic Signature(s) Signed: 09/30/2016 6:00:22 PM By: [name] Entered By: [name] on 09/30/2016 15:49:33

## 2016-10-01 NOTE — Progress Notes (Addendum)
[name] ([id number]) Visit Report for 09/30/2016 Chief Complaint Document Details [name] Date of Service: 09/30/2016 3:30 PM Patient Name: N. Patient Account Number: [id number] Medical Record Treating RN: [name] [id number] Number: Other Clinician: July 03, 1981 (36 y.o. Treating Deyton Ellenbecker Date of Birth/Sex: Female) Physician/Extender: G Primary Care [name] Physician: Referring Physician: [name] in Treatment: 69 Information Obtained from: Patient Chief Complaint Ms. Kimbell returns for evaluation of multiple ulcerations Electronic Signature(s) Signed: 09/30/2016 5:37:12 PM By: Baltazar Najjar MD Entered By: Baltazar Najjar on 09/30/2016 16:55:58 [name] ([id number]) -------------------------------------------------------------------------------- Debridement Details [name] Date of Service: 09/30/2016 3:30 PM Patient Name: N. Patient Account Number: [id number] Medical Record Treating RN: [name] [id number] Number: Other Clinician: [birth year] (35 y.o. Treating Verenis Nicosia Date of Birth/Sex: Female) Physician/Extender: G Primary Care [name] Physician: Referring Physician: [name] in Treatment: 56 Debridement Performed for Wound #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 Assessment: Performed By: Physician [name], MD Debridement: Debridement Pre-procedure Yes - 16:28 Verification/Time Out Taken: Start Time: 16:28 Pain Control: Lidocaine 4% Topical Solution Level: Skin/Subcutaneous Tissue Total Area Debrided (L x 1 (cm) x 0.7 (cm) = 0.7 (cm) W): Tissue and other Viable, Non-Viable, Eschar, Fibrin/Slough, Subcutaneous material debrided: Instrument: Curette Bleeding: Minimum Hemostasis Achieved: Pressure End Time: 16:31 Procedural Pain: 0 Post Procedural Pain: 0 Response to Treatment: Procedure was tolerated well Post Debridement Measurements of Total  Wound Length: (cm) 1 Width: (cm) 0.7 Depth: (cm) 0.3 Volume: (cm) 0.165 Character of Wound/Ulcer Post Improved Debridement: Severity of Tissue Post Debridement: Fat layer exposed Post Procedure Diagnosis Same as Pre-procedure Electronic Signature(s) [name], [name] ([id number]) Signed: 09/30/2016 5:37:12 PM By: Baltazar Najjar MD Signed: 09/30/2016 6:00:22 PM By: [name] Entered By: [name] on 09/30/2016 16:36:01 [name] ([id number]) -------------------------------------------------------------------------------- Debridement Details [name] Date of Service: 09/30/2016 3:30 PM Patient Name: N. Patient Account Number: [id number] Medical Record Treating RN: [name] [id number] Number: Other Clinician: [birth year] (36 y.o. Treating Grove Defina Date of Birth/Sex: Female) Physician/Extender: G Primary Care [name] Physician: Referring Physician: [name] in Treatment: 56 Debridement Performed for Wound #9 Left Calcaneous Assessment: Performed By: Physician [name], MD Debridement: Debridement Pre-procedure Yes - 16:33 Verification/Time Out Taken: Start Time: 16:33 Pain Control: Lidocaine 4% Topical Solution Level: Skin/Subcutaneous Tissue Total Area Debrided (L x 3 (cm) x 3.2 (cm) = 9.6 (cm) W): Tissue and other Viable, Non-Viable, Eschar, Fibrin/Slough, Subcutaneous material debrided: Instrument: Curette Bleeding: Minimum Hemostasis Achieved: Pressure End Time: 16:36 Procedural Pain: 0 Post Procedural Pain: 0 Response to Treatment: Procedure was tolerated well Post Debridement Measurements of Total Wound Length: (cm) 3 Width: (cm) 3.2 Depth: (cm) 0.2 Volume: (cm) 1.508 Character of Wound/Ulcer Post Improved Debridement: Severity of Tissue Post Debridement: Fat layer exposed Post Procedure Diagnosis Same as Pre-procedure Electronic Signature(s) [name], [name]  ([id number]) Signed: 09/30/2016 5:37:12 PM By: Baltazar Najjar MD Signed: 09/30/2016 6:00:22 PM By: [name] Entered By: [name] on 09/30/2016 16:38:06 [name] ([id number]) -------------------------------------------------------------------------------- Debridement Details [name] Date of Service: 09/30/2016 3:30 PM Patient Name: N. Patient Account Number: [id number] Medical Record Treating RN: [name] [id number] Number: Other Clinician: 1980-12-07 (36 y.o. Treating Ceferino Lang Date of Birth/Sex: Female) Physician/Extender: G Primary Care [name] Physician: Referring Physician: [name] in Treatment: 56 Debridement Performed for Wound #20 Left,Dorsal Foot Assessment: Performed By: Physician [name], MD Debridement: Debridement Pre-procedure Yes - 16:31 Verification/Time Out Taken: Start Time: 16:31 Pain Control: Lidocaine 4%  Topical Solution Level: Skin/Subcutaneous Tissue Total Area Debrided (L x 3.5 (cm) x 2.7 (cm) = 9.45 (cm) W): Tissue and other Viable, Non-Viable, Eschar, Fibrin/Slough, Subcutaneous material debrided: Instrument: Curette Bleeding: Minimum Hemostasis Achieved: Pressure End Time: 16:33 Procedural Pain: 0 Post Procedural Pain: 0 Response to Treatment: Procedure was tolerated well Post Debridement Measurements of Total Wound Length: (cm) 3.5 Stage: Unstageable/Unclassified Width: (cm) 2.7 Depth: (cm) 0.2 Volume: (cm) 1.484 Character of Wound/Ulcer Post Improved Debridement: Severity of Tissue Post Fat layer exposed Debridement: Post Procedure Diagnosis Same as Pre-procedure [name], [name] ([id number]) Electronic Signature(s) Signed: 09/30/2016 5:37:12 PM By: Baltazar Najjar MD Signed: 09/30/2016 6:00:22 PM By: [name] Entered By: Baltazar Najjar on 09/30/2016 16:55:22 [name]  ([id number]) -------------------------------------------------------------------------------- HPI Details [name] Date of Service: 09/30/2016 3:30 PM Patient Name: N. Patient Account Number: [id number] Medical Record Treating RN: [name] [id number] Number: Other Clinician: 1981-06-27 (36 y.o. Treating Jalexa Pifer Date of Birth/Sex: Female) Physician/Extender: G Primary Care [name] Physician: Referring Physician: [name] in Treatment: 48 History of Present Illness Location: dry gangrene both feet and heels Quality: Patient reports No Pain. Severity: Patient states wound are getting worse. Duration: Patient has had the wound for > 4months prior to seeking treatment at the wound center Context: The wound appeared gradually over time Modifying Factors: she has been in and out of Wu over the last 2 months Associated Signs and Symptoms: Patient reports having difficulty standing for long periods. HPI Description: [name] is a 36 y.o. female who presents to our wound center, back in June 2016, referred by her PCP Dr. Zada Finders for nonhealing ulcers on the lateral aspect of the right heel. Of note she has a history of type 1 diabetes mellitus that has been uncontrolled. Past medical history significant for type 1 diabetes mellitus not controlled, ankylosing spondylitis, anorexia nervosa, irritable bowel syndrome, chronic kidney disease, chronic diarrhea. she then developed gangrene of both feet due to severe peripheral vascular disease and also had gangrene of the tips of her fingers due to upper extremity vascular disease. She was being worked up by vascular surgery at [facility] and at [facility] and has had several procedures done there. She started with hyperbaric oxygen therapy and had a total of 40 treatments the last one being on 06/20/2015. After the initial treatment of hyperbaric oxygen therapy she started having ear problems and had  ultimately to use myringotomy tubes and this was done bilaterally. Since then her ears have been doing fine. In late September, she had seen vascular and hand surgeons. since then she's been in [place] at the rec center for surgery involving extensive vascular procedures for the upper extremities. She was then at [facility] with GI bleeds both upper and lower and has been in and out of [facility] for that. She has recently been out of [facility] for the last week. 09/09/2015 -- she was unable to get here in time to start her hyperbaric oxygen today and hence is only here for a wound care visit. 09/19/2015 -- she has been having vancomycin during her dialysis and continues to have vascular appointments and the procedure is been set for early January. She has been unable to make it for her hemodialysis due to various medical symptoms. 09/30/2014 -- her vancomycin was stopped on 09/25/2015 and the mother has noticed the right foot has started draining for the last 3 days. Addendum: after examining the patient I was able to talk to her primary vascular surgeon Dr. Pernell Dupre  at the [name] I told him about the necrotic area on the plantar aspect of right foot which is now wet gangrene and he agreed with me that he would admit her at [facility] under her care and synchronize further treatment. We have discussed her poor prognosis and he and I discussed the need for hospice care [name], [name] ([id number]) and for sitting down and talking to the patient and her mother and giving them a proper detail of the prognosis. 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 She was worked up with MRIs, abdomen aortogram and placement of left-sided angioplasties were done. After a prolonged Wu she she was discharged home and was told to wear shrinker sock and stump protector and see her surgeon for further instructions regarding wound care and suture removal. Asked to take long-term doxycycline. 01/15/16; this is a patient I haven't seen before although she is been followed by Dr. Meyer Russel in this clinic today. She is a type I diabetic with severe PAD macrovascular disease. She has had a previous BKA. She has dry gangrene of the tips of her fingers which she showed me on the right to. She also has dry gangrene of the left first second and third toes and a portion of her proximal foot around these areas. She is followed by vascular surgery at [facility] and saw them recently they are not going to do surgery as of yet. She has a large black eschar over her heel which is beginning to separate in some areas. As I understand think she is paining these with Betadine. There is been some suggestion about retrying hyperbarics on her although she is still not able to commit to the frequency of treatment that would be necessary to see improvement. She is also on Monday Wednesday Friday dialysis 01/21/16; the patient returns to see me today with regards to the left heel. Apparently she is not scheduled for any further attempts at revascularization of the left foot. 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 01/28/16; the patient returns today for attention to the left foot specifically the left calcaneal ulcer. This is covered in a thick black eschar. I crosshatched this last week and we have been using Santyl. 02/05/16; I continue to work on the thick black eschar on the patient's left calcaneus. She is using Santyl that this side crosshatched  this area and the eschar is beginning to loosen. 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 This is beginning to separate and liquefy as well especially between the first and second toes. In the time being her major complaint is fatigue at dialysis 02/26/16 I continue to work on the patient's left calcaneus thick adherent black eschar. I crosshatched this area and we have been using Santyl although it is very adherent area I remove some nonviable tissue today what I can see of this actually looks surprisingly good. On the same foot she has dry gangrene  on the first and second toes and part of the forefoot underneath this. This is beginning to separate. 03/11/16; the patient comes in for her every 2 week appointment. I have been working on the black eschar on her heel. The patient apparently again has to come off dialysis early today after 2 hours due to severe complaints of nausea. She really does not look well. 04/02/2016 -- the patient has not been seen here for about 3 weeks now and has a new issue with the stump of the right amputation site and also her right posterior thigh. They have only noticed this for the last couple of days. 04/28/16; I had received a call from the patient's surgeon at [facility]. She had a left transmetatarsal amputation and Integra applied to the left heel. Both of these areas appear to be doing well. There are dressing these with [facility] and they will return to their surgeon on Thursday. She has a new injury on the popliteal fossa on the right which I think was trauma from her stump. 05/20/16; the patient is following with her surgeon at [facility]. She's had a left transmetatarsal debridement of the left heel she had Integra place and apparently is using some consternation of Epson salts soaks, Betadine and Aquacel Ag. The left leg is wrapped I  did not look at this today. She has the wound in her right popliteal fossa which is apparently a pressure area possibly related to sitting on a toilet for 2-3 hours multiple times a day. 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 We have been using Santyl to the area on the right popliteal fossa. She has [name], [name] ([id number]) a new wound on her right gluteal area but the patient would not let me look at that today 06/03/16; patient is not doing particularly well. She now has a pressure area over her left ischial tuberosity. This is of quite a size and covered with an adherent surface slough. She looks as though she has lost weight, I didn't want to go ahead and attempt to debridement this today. There is really no evidence of infection. The area behind the right popliteal fossa looks about the same as 2 weeks ago we have been using Santyl to this area. I did not look at her left foot which is wrapped been followed by podiatry at [facility] 06/10/16; patient arrives in clinic actually looking a lot brighter than I usually see her, predictably she did not go to dialysis today. For the first time in perhaps 6 weeks I actually saw her left foot today. The transmetatarsal site is healed. The left heel has a reasonably stable-looking wound which has a clean base. Some eschar superiorly and a few sutures remain in place. More worrisome is an area on her lateral left foot over the metatarsal head. Quarter size necrotic wound that probes to bone. There is some purulence here which I cultured there is nothing that looks like a healthy base of this area. They've been using silver alginate to this area at home. She also has a large wound in the right popliteal fossa, and again a pressure area over the left ischial tuberosity. We are using Santyl to both of these areas. Both allays looks somewhat better than last week, using Santyl  to both areas 06/17/16: culture from last week grew citrobacter and amp sensitive enterococcus fecalis. Started on vanc last Saturday at dialysis. have spoken to dialysis in [place]  re adjustment in antibiotics added ceftazidine to vanc 06/24/16; the patient was discharged from [facility] yesterday. She had an IandD of the open area on the lateral aspect of her foot. She continues on vancomycin and Ceptaz again at dialysis although I'm not exactly sure of the current duration of this. She also had a debridement of the wound over the left greater trochanter done by the wound care team. She is receiving Santyl based dressings to this and the area in her right popliteal fossa. She arrives today completely fatigued from dialysis. I spoke to Dr. Allena Katz her podiatrist and surgeon at [facility] and asked if we could manage the wound VAC which I think we can. He also wanted to ask about hyperbaric oxygen with the indication of chronic osteomyelitis although at this point I am not completely certain where the chronic osteomyelitis is. Finally she apparently has had a re-graft to the area on her left heel which is either a skin graft or Integra 07/15/16; patient arrives today she is not been in the clinic since I saw her on 9/27. She's been using Santyl to the left greater trochanter, I popliteal fossa. Her home health nurse remove the wound VAC from the transmetatarsal head, there is only a small wound on the medial aspect of the foot. Dr. Allena Katz who is been managing the heel wounds has requested Xeroform to the heel. 07/22/16; patient arrives as usual postdialysis she is very fatigued and weak looking. She generally tolerates dialysis poorly requiring midodrine tosupport her blood pressure. She continues on vancomycin and I believe ceftazadine at dialysis for chronic osteomyelitis oShe has a wound on the lateral left transmetatarsal amputation. This was debrided of surface slough nonviable subcutaneous tissue the  base of this looks healthy and improved. oHer left heel wound is followed by by Dr. Allena Katz at [facility] her surgeon. We have been applying Xeroform to this at his request, he'll oShe has a deep wound over her left greater trochanter however this does not appear to be infected has healthy granulation and I think could be well served by a wound VAC with collagen under the foam oUnder the right popliteal fossa there is a pressure area apparently from a toilet seat. This has been making progress again the tissue here appears to be healthy we have been using silver collagen in this area as well oThere is a new small area on the lateral aspect of her right stump which appears to be draining purulent material. Our nurses obtained a specimen of this for culture. As noted she is already onVAncomycin e a third generation cephalosporin 08/05/16 -Ms. Beachem arrives today accompanied by her aunt and cousin, she did not receive dialysis today due to "feeling bad" and is scheduled to received dialysis tomorrow. She has completed vancomycin that she was receiving with dialysis. She has an appointment with podiatry on Friday; admits to using right prosthesis for transfers primarily [place], [employer]. ([id number]) -She has a wound on the lateral left TMA site, essentially unchanged and stable; staples remain in medial aspect of healed surgical site from August, patient states that the plan is to remove staples on Friday -Left heel ulcer appears stable with granulation tissue -stage III left trochanter pressure ulcer with peri-wound irritation, appears to be from leakage despite patient stating that VAC has not leaked, has been using Prisma and wound VAC -Right popliteal fossa pressure ulcer is stable -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 and pale erythema to lateral aspect, improved from last  week, although purulence able to be expressed, will encourage warm compresses to aide in bring fluid to surface - new  areas of DTI to left foot dorsal and lateral aspect; patient admits that foot has been wrapped with ace per podiatry, she also wears surgical shoe to left foot; dorsal DTI has intact blister; dorsal DTI not present at last appointment, lateral DTI present but significantly smaller at last appointment; both areas (dorsal>lateral) concerning for threatening limb loss, new to size and location - she has seen "limb loss specialist" at [facility] in the past and was encouraged to follow up with that provider in presence of new development 09/08/16 I have not seen this patient in quite some time perhaps late October. She was seen once here at the beginning of November however her appointments are scheduled after dialysis at which time she doesn't feel well enough to come. Also noted recently our staff of taken several phone calls from her home health nurse that been concerned about worsening wounds on the back of the right knee left greater trochanter. We brought her in today to go over this. As far as I can tell the wounds are as follows; #1 pressure ulcer on the back of her right knee in the popliteal fossa. As best I can tell this is caused by prolonged sitting on the toilet seat related to ongoing issues with profuse, high-volume diarrhea #2 pressure ulcer over the left greater trochanter. We have been using a wound VAC on this area #3 probing ulcer on the medial aspect of her left transmetatarsal amputation #4 pressure ulcer on the left posterior calcaneus #5 excoriation on the lateral aspect of her left foot which is not clearly open but was concerning last time she was here for a deep tissue injury. She is off antibiotics at dialysis since the last time I saw her. This was for underlying osteomyelitis. 09/16/16; she arrives today with the pressure ulcer on the back of her right knee still draining and looking somewhat worse. Culture I did last week grew Klebsiella and enterococcus which is ampicillin  sensitive. I therefore have given her prescription for Augmentin 500/125 once a day post dialysis on dialysis days. The other concerning thing today is the ischemic-looking areas on the lateral left foot and the left anterior foot. She was supposed to see Dr. Pernell Dupre at [facility] today but they did not make it there. 09/30/16; since the patient was last seen here on 12/20 she has been to see Dr. Pernell Dupre at [facility] and apparently he revascularized her left foot although I don't have any of these details. We are concerned last time with ischemic-looking wounds on the left heel left lateral foot. As well she is completed her antibiotics for the drainage coming out of the area on the right popliteal fossa and this looks somewhat better today. Finally she wants to have the VAC discontinued for a period of time stating that she finds it irritating. To be truthful the circumference of the wound over the left buttock seems better but the depth per our intake nurse today is actually worse making it somewhat difficult to argue with her Electronic Signature(s) Signed: 09/30/2016 5:37:12 PM By: Baltazar Najjar MD Entered By: Baltazar Najjar on 09/30/2016 16:58:18 [name] ([id number]) -------------------------------------------------------------------------------- Physical Exam Details [name] Date of Service: 09/30/2016 3:30 PM Patient Name: N. Patient Account Number: [id number] Medical Record Treating RN: [name] [id number] Number: Other Clinician: 17-Oct-1980 (35 y.o. Treating Sanna Porcaro Date of Birth/Sex: Female) Physician/Extender: Reece Agar  Primary Care [name] Physician: Referring Physician: [name] in Treatment: 53 Constitutional Sitting or standing Blood Pressure is within target range for patient.. Pulse regular and within target range for patient.Marland Kitchen Respirations regular, non-labored and within target range.. Temperature is normal and within the target range for  the patient.. Patient's appearance is neat and clean. Appears in no acute distress. Well nourished and well developed.. Cardiovascular Pedal pulses absent bilaterally.. Notes Wound exam; #1 right popliteal fossa. This actually looks somewhat better than last time presumably secondary to antibiotics it has surface slough on it no debridement #2 left buttock; this measures down several centimeters however it does not probe to bone. The circumference of this wound looks somewhat better to me looking back at it over the last 2-3 months however the depth doesn't seem to of change that much. There is no evidence of infection. #3 on the left foot there are 3 areas here; firstly a necrotic wound over the left Achilles heel, a necrotic wound over the left lateral midfoot and the original wound at the lateral aspect of her amputation site. The lateral 2 wounds actually look better Electronic Signature(s) Signed: 09/30/2016 5:37:12 PM By: Baltazar Najjar MD Entered By: Baltazar Najjar on 09/30/2016 17:00:53 [name] ([id number]) -------------------------------------------------------------------------------- Physician Orders Details [name] Date of Service: 09/30/2016 3:30 PM Patient Name: N. Patient Account Number: [id number] Medical Record Treating RN: [name] [id number] Number: Other Clinician: 1981/01/03 (35 y.o. [name] [name] Date of Birth/Sex: Female) Physician/Extender: G Primary Care [name] Physician: Referring Physician: [name] in Treatment: 45 Verbal / Phone Orders: Yes Clinician: [name] Read Back and Verified: Yes Diagnosis Coding Wound Cleansing 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,Posterior Amputation Site - Below Knee o Clean wound with Normal Saline. o Cleanse wound with mild soap and water Wound #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 o Clean wound with Normal Saline. o Cleanse wound with mild soap and water Wound  #18 Left Ischial Tuberosity o Clean wound with Normal Saline. o Cleanse wound with mild soap and water Wound #20 Left,Dorsal Foot o Clean wound with Normal Saline. o Cleanse wound with mild soap and water Wound #21 Left,Lateral Foot o Clean wound with Normal Saline. o Cleanse wound with mild soap and water Wound #22 Right Ischial Tuberosity o Clean wound with Normal Saline. o Cleanse wound with mild soap and water Wound #9 Left Calcaneous o Clean wound with Normal Saline. 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,Posterior Amputation Site - Below Knee o Topical Lidocaine 4% cream applied to wound bed prior to debridement Wound #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 [name], [name] ([id number]) o Topical Lidocaine 4% cream applied to wound bed prior to debridement Wound #18 Left Ischial Tuberosity o Topical Lidocaine 4% cream applied to wound bed prior to debridement Wound #20 Left,Dorsal Foot o Topical Lidocaine 4% cream applied to wound bed prior to debridement Wound #21 Left,Lateral Foot o Topical Lidocaine 4% cream applied to wound bed prior to debridement Wound #22 Right Ischial Tuberosity o Topical Lidocaine 4% cream applied to wound bed prior to debridement Wound #9 Left Calcaneous o Topical Lidocaine 4% cream applied to wound bed prior to debridement Primary Wound Dressing 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,Posterior Amputation Site - Below Knee o Iodoflex Wound #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 o Iodoflex Wound #18 Left Ischial Tuberosity o Aquacel Ag o Drawtex Wound #20 Left,Dorsal Foot o Iodoflex Wound #21 Left,Lateral Foot o Iodoflex Wound #22 Right Ischial Tuberosity o  Boardered Foam Dressing Wound #9 Left Calcaneous o Iodoflex Secondary Dressing 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,Posterior Amputation Site - Below Knee o Boardered Foam Dressing - on R posterior BKA site wrap lightly with  conform Wound #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 o Boardered Foam Dressing - on R posterior BKA site wrap lightly with conform Wound #18 Left Ischial Tuberosity o Boardered Foam Dressing - on R posterior BKA site wrap lightly with conform [name], [name] ([id number]) Wound #20 Left,Dorsal Foot o Boardered Foam Dressing - on R posterior BKA site wrap lightly with conform Wound #21 Left,Lateral Foot o Boardered Foam Dressing - on R posterior BKA site wrap lightly with conform Wound #9 Left Calcaneous o Boardered Foam Dressing - on R posterior BKA site wrap lightly with conform Dressing Change Frequency 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,Posterior Amputation Site - Below Knee o Change Dressing Monday, Wednesday, Friday - and as needed for soilage Wound #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 o Change Dressing Monday, Wednesday, Friday - and as needed for soilage Wound #18 Left Ischial Tuberosity o Change Dressing Monday, Wednesday, Friday - and as needed for soilage Wound #20 Left,Dorsal Foot o Change Dressing Monday, Wednesday, Friday - and as needed for soilage Wound #21 Left,Lateral Foot o Change Dressing Monday, Wednesday, Friday - and as needed for soilage Wound #22 Right Ischial Tuberosity o Change Dressing Monday, Wednesday, Friday - and as needed for soilage Wound #9 Left Calcaneous o Change Dressing Monday, Wednesday, Friday - and as needed for soilage Follow-up Appointments 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 o Other: - as patient is able Wound #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 o Return Appointment in 1 week. o Other: - as patient is able Wound #18 Left Ischial Tuberosity o Return Appointment in 1 week. o Other: - as patient is able Wound #20 Left,Dorsal Foot o Return Appointment in 1 week. o Other: - as patient is able [name], [name] ([id number]) Wound #21  Left,Lateral Foot o Return Appointment in 1 week. o Other: - as patient is able Wound #22 Right Ischial Tuberosity o Return Appointment in 1 week. o Other: - as patient is able Wound #9 Left Calcaneous o Return Appointment in 1 week. 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 Wound #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 o Increase protein intake. Wound #18 Left Ischial Tuberosity o Increase protein intake. Wound #20 Left,Dorsal Foot o Increase protein intake. Wound #21 Left,Lateral Foot o Increase protein intake. Wound #22 Right Ischial Tuberosity o Increase protein intake. Wound #9 Left Calcaneous o Increase protein intake. Home Health 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,Posterior Amputation Site - Below Knee o Continue Home Health Visits - Please give Joretta all the PRN visits that she needs - Also please have PT eval and treat as appropriate o Home Health Nurse may visit PRN to address patientos wound care needs. o FACE TO FACE ENCOUNTER: MEDICARE and MEDICAID PATIENTS: I certify that this patient is under my care and that I had a face-to-face encounter that meets the physician face-to-face encounter requirements with this patient on this date. The encounter with the patient was in whole or in part for the following MEDICAL CONDITION: (primary reason for Home Healthcare) MEDICAL NECESSITY: I certify, that based on my findings, NURSING services are a medically necessary home health service. HOME BOUND STATUS: I certify that my clinical findings support that this patient is  homebound (i.e., Due to illness or injury, pt requires aid of supportive devices such as crutches, cane, wheelchairs, walkers, the use of special transportation or the assistance of another person to leave their place of residence. There is a Lie, Lajuan N. ([id number]) normal  inability to leave the home and doing so requires considerable and taxing effort. Other absences are for medical reasons / religious services and are infrequent or of short duration when for other reasons). o If current dressing causes regression in wound condition, may D/C ordered dressing product/s and apply Normal Saline Moist Dressing daily until next Wound Healing Center / Other MD appointment. Notify Wound Healing Center of regression in wound condition at [phone]. o Please direct any NON-WOUND related issues/requests for orders to patient's Primary Care Physician Wound #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 o Continue Home Health Visits - Please give Teasia all the PRN visits that she needs - Also please have PT eval and treat as appropriate o Home Health Nurse may visit PRN to address patientos wound care needs. o FACE TO FACE ENCOUNTER: MEDICARE and MEDICAID PATIENTS: I certify that this patient is under my care and that I had a face-to-face encounter that meets the physician face-to-face encounter requirements with this patient on this date. The encounter with the patient was in whole or in part for the following MEDICAL CONDITION: (primary reason for Home Healthcare) MEDICAL NECESSITY: I certify, that based on my findings, NURSING services are a medically necessary home health service. HOME BOUND STATUS: I certify that my clinical findings support that this patient is homebound (i.e., Due to illness or injury, pt requires aid of supportive devices such as crutches, cane, wheelchairs, walkers, the use of special transportation or the assistance of another person to leave their place of residence. There is a normal inability to leave the home and doing so requires considerable and taxing effort. Other absences are for medical reasons / religious services and are infrequent or of short duration when for other reasons). o If current dressing causes regression in  wound condition, may D/C ordered dressing product/s and apply Normal Saline Moist Dressing daily until next Wound Healing Center / Other MD appointment. Notify Wound Healing Center of regression in wound condition at [phone]. o Please direct any NON-WOUND related issues/requests for orders to patient's Primary Care Physician Wound #18 Left Ischial Tuberosity o Continue Home Health Visits - Please give Karin all the PRN visits that she needs - Also please have PT eval and treat as appropriate o Home Health Nurse may visit PRN to address patientos wound care needs. o FACE TO FACE ENCOUNTER: MEDICARE and MEDICAID PATIENTS: I certify that this patient is under my care and that I had a face-to-face encounter that meets the physician face-to-face encounter requirements with this patient on this date. The encounter with the patient was in whole or in part for the following MEDICAL CONDITION: (primary reason for Home Healthcare) MEDICAL NECESSITY: I certify, that based on my findings, NURSING services are a medically necessary home health service. HOME BOUND STATUS: I certify that my clinical findings support that this patient is homebound (i.e., Due to illness or injury, pt requires aid of supportive devices such as crutches, cane, wheelchairs, walkers, the use of special transportation or the assistance of another person to leave their place of residence. There is a normal inability to leave the home and doing so requires considerable and taxing effort. Other absences are for medical reasons / religious services and are infrequent or  of short duration when for other reasons). o If current dressing causes regression in wound condition, may D/C ordered dressing product/s and apply Normal Saline Moist Dressing daily until next Wound Healing Center / Other MD appointment. Notify Wound Healing Center of regression in wound condition at [phone]. [name], MIKRUT  ([id number]) o Please direct any NON-WOUND related issues/requests for orders to patient's Primary Care Physician Wound #20 Left,Dorsal Foot o Continue Home Health Visits - Please give Llesenia all the PRN visits that she needs - Also please have PT eval and treat as appropriate o Home Health Nurse may visit PRN to address patientos wound care needs. o FACE TO FACE ENCOUNTER: MEDICARE and MEDICAID PATIENTS: I certify that this patient is under my care and that I had a face-to-face encounter that meets the physician face-to-face encounter requirements with this patient on this date. The encounter with the patient was in whole or in part for the following MEDICAL CONDITION: (primary reason for Home Healthcare) MEDICAL NECESSITY: I certify, that based on my findings, NURSING services are a medically necessary home health service. HOME BOUND STATUS: I certify that my clinical findings support that this patient is homebound (i.e., Due to illness or injury, pt requires aid of supportive devices such as crutches, cane, wheelchairs, walkers, the use of special transportation or the assistance of another person to leave their place of residence. There is a normal inability to leave the home and doing so requires considerable and taxing effort. Other absences are for medical reasons / religious services and are infrequent or of short duration when for other reasons). o If current dressing causes regression in wound condition, may D/C ordered dressing product/s and apply Normal Saline Moist Dressing daily until next Wound Healing Center / Other MD appointment. Notify Wound Healing Center of regression in wound condition at [phone]. o Please direct any NON-WOUND related issues/requests for orders to patient's Primary Care Physician Wound #21 Left,Lateral Foot o Continue Home Health Visits - Please give Iracema all the PRN visits that she needs - Also please have PT eval and treat as  appropriate o Home Health Nurse may visit PRN to address patientos wound care needs. o FACE TO FACE ENCOUNTER: MEDICARE and MEDICAID PATIENTS: I certify that this patient is under my care and that I had a face-to-face encounter that meets the physician face-to-face encounter requirements with this patient on this date. The encounter with the patient was in whole or in part for the following MEDICAL CONDITION: (primary reason for Home Healthcare) MEDICAL NECESSITY: I certify, that based on my findings, NURSING services are a medically necessary home health service. HOME BOUND STATUS: I certify that my clinical findings support that this patient is homebound (i.e., Due to illness or injury, pt requires aid of supportive devices such as crutches, cane, wheelchairs, walkers, the use of special transportation or the assistance of another person to leave their place of residence. There is a normal inability to leave the home and doing so requires considerable and taxing effort. Other absences are for medical reasons / religious services and are infrequent or of short duration when for other reasons). o If current dressing causes regression in wound condition, may D/C ordered dressing product/s and apply Normal Saline Moist Dressing daily until next Wound Healing Center / Other MD appointment. Notify Wound Healing Center of regression in wound condition at [phone]. o Please direct any NON-WOUND related issues/requests for orders to patient's Primary Care Physician Wound #22 Right Ischial Tuberosity o Continue Home  Health Visits - Please give Violet all the PRN visits that she needs - Also please have PT eval and treat as appropriate LASHAY, OSBORNE ([id number]) o Home Health Nurse may visit PRN to address patientos wound care needs. o FACE TO FACE ENCOUNTER: MEDICARE and MEDICAID PATIENTS: I certify that this patient is under my care and that I had a face-to-face encounter  that meets the physician face-to-face encounter requirements with this patient on this date. The encounter with the patient was in whole or in part for the following MEDICAL CONDITION: (primary reason for Home Healthcare) MEDICAL NECESSITY: I certify, that based on my findings, NURSING services are a medically necessary home health service. HOME BOUND STATUS: I certify that my clinical findings support that this patient is homebound (i.e., Due to illness or injury, pt requires aid of supportive devices such as crutches, cane, wheelchairs, walkers, the use of special transportation or the assistance of another person to leave their place of residence. There is a normal inability to leave the home and doing so requires considerable and taxing effort. Other absences are for medical reasons / religious services and are infrequent or of short duration when for other reasons). o If current dressing causes regression in wound condition, may D/C ordered dressing product/s and apply Normal Saline Moist Dressing daily until next Wound Healing Center / Other MD appointment. Notify Wound Healing Center of regression in wound condition at [phone]. o Please direct any NON-WOUND related issues/requests for orders to patient's Primary Care Physician Wound #9 Left Calcaneous o Continue Home Health Visits - Please give Daya all the PRN visits that she needs - Also please have PT eval and treat as appropriate o Home Health Nurse may visit PRN to address patientos wound care needs. o FACE TO FACE ENCOUNTER: MEDICARE and MEDICAID PATIENTS: I certify that this patient is under my care and that I had a face-to-face encounter that meets the physician face-to-face encounter requirements with this patient on this date. The encounter with the patient was in whole or in part for the following MEDICAL CONDITION: (primary reason for Home Healthcare) MEDICAL NECESSITY: I certify, that based on my  findings, NURSING services are a medically necessary home health service. HOME BOUND STATUS: I certify that my clinical findings support that this patient is homebound (i.e., Due to illness or injury, pt requires aid of supportive devices such as crutches, cane, wheelchairs, walkers, the use of special transportation or the assistance of another person to leave their place of residence. There is a normal inability to leave the home and doing so requires considerable and taxing effort. Other absences are for medical reasons / religious services and are infrequent or of short duration when for other reasons). o If current dressing causes regression in wound condition, may D/C ordered dressing product/s and apply Normal Saline Moist Dressing daily until next Wound Healing Center / Other MD appointment. Notify Wound Healing Center of regression in wound condition at [phone]. o Please direct any NON-WOUND related issues/requests for orders to patient's Primary Care Physician Negative Pressure Wound Therapy Wound #18 Left Ischial Tuberosity o Place NPWT on HOLD. [facility] please call [facility] if this wound starts getting worse and NPWT will be restarted Electronic Signature(s) Signed: 09/30/2016 5:37:12 PM By: Baltazar Najjar MD [name] ([id number]) Signed: 09/30/2016 6:00:22 PM By: [name] Entered By: [name] on 09/30/2016 16:47:57 [name] ([id number]) -------------------------------------------------------------------------------- Problem List Details [name] Date of Service: 09/30/2016 3:30 PM Patient Name:  N. Patient Account Number: [id number] Medical Record Treating RN: [name] [id number] Number: Other Clinician: 22-Aug-1981 (36 y.o. Treating Zofia Peckinpaugh Date of Birth/Sex: Female) Physician/Extender: G Primary Care [name] Physician: Referring Physician: [name] in Treatment: 28 Active  Problems ICD-10 Encounter Code Description Active Date Diagnosis E10.621 Type 1 diabetes mellitus with foot ulcer 08/30/2015 Yes E10.52 Type 1 diabetes mellitus with diabetic peripheral 08/30/2015 Yes angiopathy with gangrene I70.245 Atherosclerosis of native arteries of left leg with ulceration 08/30/2015 Yes of other part of foot I70.261 Atherosclerosis of native arteries of extremities with 08/30/2015 Yes gangrene, right leg L89.622 Pressure ulcer of left heel, stage 2 08/30/2015 Yes Z89.511 Acquired absence of right leg below knee 11/29/2015 Yes T87.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, right lower extremity 04/02/2016 Yes S71.101A Unspecified open wound, right thigh, initial encounter 04/02/2016 Yes L89.322 Pressure ulcer of left buttock, stage 2 06/10/2016 Yes Golladay, Natascha N. ([id number]) L89.899 Pressure ulcer of other site, unspecified stage 08/06/2016 Yes L89.223 Pressure ulcer of left hip, stage 3 08/05/2016 Yes Inactive Problems Resolved Problems ICD-10 Code Description Active Date Resolved Date L02.611 Cutaneous abscess of right foot 10/01/2015 10/01/2015 Electronic Signature(s) Signed: 09/30/2016 5:37:12 PM By: Baltazar Najjar MD Entered By: Baltazar Najjar on 09/30/2016 16:54:32 [name] ([id number]) -------------------------------------------------------------------------------- Progress Note Details [name] Date of Service: 09/30/2016 3:30 PM Patient Name: N. Patient Account Number: [id number] Medical Record Treating RN: [name] [id number] Number: Other Clinician: May 01, 1981 (35 y.o. Treating Cienna Dumais Date of Birth/Sex: Female) Physician/Extender: G Primary Care [name] Physician: Referring Physician: [name] in Treatment: 62 Subjective Chief Complaint Information obtained from Patient Ms. Halliday returns for evaluation of multiple ulcerations History of Present Illness (HPI) The following HPI elements were documented  for the patient's wound: Location: dry gangrene both feet and heels Quality: Patient reports No Pain. Severity: Patient states wound are getting worse. Duration: Patient has had the wound for > 76months prior to seeking treatment at the wound center Context: The wound appeared gradually over time Modifying Factors: she has been in and out of [facility] over the last 2 months Associated Signs and Symptoms: Patient reports having difficulty standing for long periods. Maylea Soria is a 36 y.o. female who presents to our wound center, back in June 2016, referred by her PCP Dr. Zada Finders for nonhealing ulcers on the lateral aspect of the right heel. Of note she has a history of type 1 diabetes mellitus that has been uncontrolled. Past medical history significant for type 1 diabetes mellitus not controlled, ankylosing spondylitis, anorexia nervosa, irritable bowel syndrome, chronic kidney disease, chronic diarrhea. she then developed gangrene of both feet due to severe peripheral vascular disease and also had gangrene of the tips of her fingers due to upper extremity vascular disease. She was being worked up by vascular surgery at [facility] and at [facility] and has had several procedures done there. She started with hyperbaric oxygen therapy and had a total of 40 treatments the last one being on 06/20/2015. After the initial treatment of hyperbaric oxygen therapy she started having ear problems and had ultimately to use myringotomy tubes and this was done bilaterally. Since then her ears have been doing fine. In late September, she had seen vascular and hand surgeons. since then she's been in [place] at the rec center for surgery involving extensive vascular procedures for the upper extremities. She was then at [facility] with GI bleeds both upper and lower and has been in and out of [facility] for that. She has  recently been out of [facility] for the last week. 09/09/2015 -- she was unable to get here in time  to start her hyperbaric oxygen today and hence is only here for a wound care visit. 09/19/2015 -- she has been having vancomycin during her dialysis and continues to have vascular appointments and the procedure is been set for early January. She has been unable to make it for her ZAKIAH, GAUTHREAUX. ([id number]) hemodialysis due to various medical symptoms. 09/30/2014 -- her vancomycin was stopped on 09/25/2015 and the mother has noticed the right foot has started draining for the last 3 days. Addendum: after examining the patient I was able to talk to her primary vascular surgeon Dr. Pernell Dupre at the [name] I told him about the necrotic area on the plantar aspect of right foot which is now wet gangrene and he agreed with me that he would admit her at [facility] under her care and synchronize further treatment. We have discussed her poor prognosis and he and I discussed the need for hospice care and for sitting down and talking to the patient and her mother and giving them a proper detail of the prognosis. 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 She was worked up with MRIs, abdomen aortogram and placement of left-sided angioplasties were done. After a prolonged [facility] she she was discharged home and was told to wear shrinker sock and stump protector and see her surgeon for further instructions regarding wound care and suture removal. Asked to take long-term doxycycline. 01/15/16; this is a patient I haven't seen before although she is been followed by Dr. Meyer Russel in this clinic today. She is  a type I diabetic with severe PAD macrovascular disease. She has had a previous BKA. She has dry gangrene of the tips of her fingers which she showed me on the right to. She also has dry gangrene of the left first second and third toes and a portion of her proximal foot around these areas. She is followed by vascular surgery at [facility] and saw them recently they are not going to do surgery as of yet. She has a large black eschar over her heel which is beginning to separate in some areas. As I understand think she is paining these with Betadine. There is been some suggestion about retrying hyperbarics on her although she is still not able to commit to the frequency of treatment that would be necessary to see improvement. She is also on Monday Wednesday Friday dialysis 01/21/16; the patient returns to see me today with regards to the left heel. Apparently she is not scheduled for any further attempts at revascularization of the left foot. 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 01/28/16; the patient returns today for attention to the left foot specifically the left calcaneal ulcer. This is covered in a thick black eschar. I crosshatched this last week and we have been using Santyl. 02/05/16; I continue to work on the thick black eschar on the patient's left calcaneus. She is using Santyl that this side crosshatched this area and the eschar  is beginning to loosen. 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 This is beginning to separate and liquefy as well especially between the first and second toes. In the time being her major complaint is fatigue at dialysis 02/26/16 I continue to work on the patient's left calcaneus thick adherent black eschar. I crosshatched this area and we have been using  Santyl although it is very adherent area I remove some nonviable tissue today what I can see of this actually looks surprisingly good. On the same foot she has dry gangrene on the first and second toes and part of the forefoot underneath this. This is beginning to separate. 03/11/16; the patient comes in for her every 2 week appointment. I have been working on the black eschar on her heel. The patient apparently again has to come off dialysis early today after 2 hours due to severe complaints of nausea. She really does not look well. 04/02/2016 -- the patient has not been seen here for about 3 weeks now and has a new issue with the stump of the right amputation site and also her right posterior thigh. They have only noticed this for the last couple of days. 04/28/16; I had received a call from the patient's surgeon at [facility]. She had a left transmetatarsal amputation and Integra applied to the left heel. Both of these areas appear to be doing well. There are dressing these Azbill, Raidyn N. ([id number]) with Kandis Mannan and they will return to their surgeon on Thursday. She has a new injury on the popliteal fossa on the right which I think was trauma from her stump. 05/20/16; the patient is following with her surgeon at [facility]. She's had a left transmetatarsal debridement of the left heel she had Integra place and apparently is using some consternation of Epson salts soaks, Betadine and Aquacel Ag. The left leg is wrapped I did not look at this today. She has the wound in her right popliteal fossa which is apparently a pressure area possibly related to sitting on a toilet for 2-3 hours multiple times a day. 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 We have been using Santyl to the area on the right popliteal fossa. She has a new wound on her right gluteal area but the patient would not let me look at that today 06/03/16;  patient is not doing particularly well. She now has a pressure area over her left ischial tuberosity. This is of quite a size and covered with an adherent surface slough. She looks as though she has lost weight, I didn't want to go ahead and attempt to debridement this today. There is really no evidence of infection. The area behind the right popliteal fossa looks about the same as 2 weeks ago we have been using Santyl to this area. I did not look at her left foot which is wrapped been followed by podiatry at [facility] 06/10/16; patient arrives in clinic actually looking a lot brighter than I usually see her, predictably she did not go to dialysis today. For the first time in perhaps 6 weeks I actually saw her left foot today. The transmetatarsal site is healed. The left heel has a reasonably stable-looking wound which has a clean base. Some eschar superiorly and a few sutures remain in place. More worrisome is an  area on her lateral left foot over the metatarsal head. Quarter size necrotic wound that probes to bone. There is some purulence here which I cultured there is nothing that looks like a healthy base of this area. They've been using silver alginate to this area at home. She also has a large wound in the right popliteal fossa, and again a pressure area over the left ischial tuberosity. We are using Santyl to both of these areas. Both allays looks somewhat better than last week, using Santyl to both areas 06/17/16: culture from last week grew citrobacter and amp sensitive enterococcus fecalis. Started on vanc last Saturday at dialysis. have spoken to dialysis in [place] re adjustment in antibiotics added ceftazidine to vanc 06/24/16; the patient was discharged from [facility] yesterday. She had an IandD of the open area on the lateral aspect of her foot. She continues on vancomycin and Ceptaz again at dialysis although I'm not exactly sure of the current duration of this. She also had a  debridement of the wound over the left greater trochanter done by the wound care team. She is receiving Santyl based dressings to this and the area in her right popliteal fossa. She arrives today completely fatigued from dialysis. I spoke to Dr. Allena Katz her podiatrist and surgeon at [facility] and asked if we could manage the wound VAC which I think we can. He also wanted to ask about hyperbaric oxygen with the indication of chronic osteomyelitis although at this point I am not completely certain where the chronic osteomyelitis is. Finally she apparently has had a re-graft to the area on her left heel which is either a skin graft or Integra 07/15/16; patient arrives today she is not been in the clinic since I saw her on 9/27. She's been using Santyl to the left greater trochanter, I popliteal fossa. Her home health nurse remove the wound VAC from the transmetatarsal head, there is only a small wound on the medial aspect of the foot. Dr. Allena Katz who is been managing the heel wounds has requested Xeroform to the heel. 07/22/16; patient arrives as usual postdialysis she is very fatigued and weak looking. She generally tolerates dialysis poorly requiring midodrine tosupport her blood pressure. 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 This was debrided of surface slough nonviable subcutaneous tissue the base of this looks healthy and improved. Her left heel wound is followed by by Dr. Allena Katz at [facility] her surgeon. We have been applying Xeroform to this at his request, he'll She has a deep wound over her left greater trochanter however this does not appear to be infected has healthy granulation and I think could be well served by a wound VAC with collagen under the foam Under the right popliteal fossa there is a pressure area apparently from a toilet seat. This has been making progress again the tissue  here appears to be healthy we have been using silver collagen in this area as well Ruelas, Jashawna N. ([id number]) There is a new small area on the lateral aspect of her right stump which appears to be draining purulent material. Our nurses obtained a specimen of this for culture. As noted she is already onVAncomycin e a third generation cephalosporin 08/05/16 -Ms. Noyes arrives today accompanied by her aunt and cousin, she did not receive dialysis today due to "feeling bad" and is scheduled to received dialysis tomorrow. She has completed vancomycin that she  was receiving with dialysis. She has an appointment with podiatry on Friday; admits to using right prosthesis for transfers primarily -She has a wound on the lateral left TMA site, essentially unchanged and stable; staples remain in medial aspect of healed surgical site from August, patient states that the plan is to remove staples on Friday -Left heel ulcer appears stable with granulation tissue -stage III left trochanter pressure ulcer with peri-wound irritation, appears to be from leakage despite patient stating that VAC has not leaked, has been using Prisma and wound VAC -Right popliteal fossa pressure ulcer is stable -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 and pale erythema to lateral aspect, improved from last week, although purulence able to be expressed, will encourage warm compresses to aide in bring fluid to surface - new areas of DTI to left foot dorsal and lateral aspect; patient admits that foot has been wrapped with ace per podiatry, she also wears surgical shoe to left foot; dorsal DTI has intact blister; dorsal DTI not present at last appointment, lateral DTI present but significantly smaller at last appointment; both areas (dorsal>lateral) concerning for threatening limb loss, new to size and location - she has seen "limb loss specialist" at [facility] in the past and was encouraged to follow up with that provider in  presence of new development 09/08/16 I have not seen this patient in quite some time perhaps late October. She was seen once here at the beginning of November however her appointments are scheduled after dialysis at which time she doesn't feel well enough to come. Also noted recently our staff of taken several phone calls from her home health nurse that been concerned about worsening wounds on the back of the right knee left greater trochanter. We brought her in today to go over this. As far as I can tell the wounds are as follows; #1 pressure ulcer on the back of her right knee in the popliteal fossa. As best I can tell this is caused by prolonged sitting on the toilet seat related to ongoing issues with profuse, high-volume diarrhea #2 pressure ulcer over the left greater trochanter. We have been using a wound VAC on this area #3 probing ulcer on the medial aspect of her left transmetatarsal amputation #4 pressure ulcer on the left posterior calcaneus #5 excoriation on the lateral aspect of her left foot which is not clearly open but was concerning last time she was here for a deep tissue injury. She is off antibiotics at dialysis since the last time I saw her. This was for underlying osteomyelitis. 09/16/16; she arrives today with the pressure ulcer on the back of her right knee still draining and looking somewhat worse. Culture I did last week grew Klebsiella and enterococcus which is ampicillin sensitive. I therefore have given her prescription for Augmentin 500/125 once a day post dialysis on dialysis days. The other concerning thing today is the ischemic-looking areas on the lateral left foot and the left anterior foot. She was supposed to see Dr. Pernell Dupre at [facility] today but they did not make it there. 09/30/16; since the patient was last seen here on 12/20 she has been to see Dr. Pernell Dupre at [facility] and apparently he revascularized her left foot although I don't have any of these details. We are  concerned last time with ischemic-looking wounds on the left heel left lateral foot. As well she is completed her antibiotics for the drainage coming out of the area on the right popliteal fossa and this looks  somewhat better today. Finally she wants to have the VAC discontinued for a period of time stating that she finds it irritating. To be truthful the circumference of the wound over the left buttock seems better but the depth per our intake nurse [name] ([id number]) today is actually worse making it somewhat difficult to argue with her Objective Constitutional Sitting or standing Blood Pressure is within target range for patient.. Pulse regular and within target range for patient.Marland Kitchen Respirations regular, non-labored and within target range.. Temperature is normal and within the target range for the patient.. Patient's appearance is neat and clean. Appears in no acute distress. Well nourished and well developed.. Vitals Time Taken: 3:49 PM, Height: 68 in, Weight: 96 lbs, BMI: 14.6, Temperature: 98.5 F, Pulse: 71 bpm, Respiratory Rate: 16 breaths/min, Blood Pressure: 126/73 mmHg. Cardiovascular Pedal pulses absent bilaterally.. General Notes: Wound exam; #1 right popliteal fossa. This actually looks somewhat better than last time presumably secondary to antibiotics it has surface slough on it no debridement #2 left buttock; this measures down several centimeters however it does not probe to bone. The circumference of this wound looks somewhat better to me looking back at it over the last 2-3 months however the depth doesn't seem to of change that much. There is no evidence of infection. #3 on the left foot there are 3 areas here; firstly a necrotic wound over the left Achilles heel, a necrotic wound over the left lateral midfoot and the original wound at the lateral aspect of her amputation site. The lateral 2 wounds actually look better Integumentary (Hair, Skin) Wound #16  status is Open. Original cause of wound was Pressure Injury. 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 The wound measures 4.5cm length x 5.2cm width x 0.3cm depth; 18.378cm^2 area and 5.513cm^3 volume. There is tendon and fat exposed. There is no tunneling or undermining noted. There is a large amount of purulent drainage noted. The wound margin is distinct with the outline attached to the wound base. There is medium (34-66%) pink granulation within the wound bed. There is a medium (34-66%) amount of necrotic tissue within the wound bed including Eschar and Adherent Slough. The periwound skin appearance exhibited: Induration, Localized Edema, Moist, Erythema. The periwound skin appearance did not exhibit: Callus, Crepitus, Excoriation, Fluctuance, Friable, Rash, Scarring, Dry/Scaly, Maceration, Atrophie Blanche, Cyanosis, Ecchymosis, Hemosiderin Staining, Mottled, Pallor, Rubor. The surrounding wound skin color is noted with erythema which is circumferential. Periwound temperature was noted as No Abnormality. The periwound has tenderness on palpation. Wound #17 status is Open. Original cause of wound was Surgical Injury. The wound is located on the Left Amputation Site - Transmetatarsal. The wound measures 1cm length x 0.7cm width x 0.2cm depth; 0.55cm^2 area and 0.11cm^3 volume. There is fat exposed. There is no tunneling or undermining noted. There is a large amount of serous drainage noted. The wound margin is flat and intact. There is small (1-33%) pink granulation within the wound bed. There is a large (67-100%) amount of necrotic tissue within the wound bed including Adherent Slough. The periwound skin appearance exhibited: Moist, Ecchymosis. LIBRADA, CASTRONOVO ([id number]) The periwound skin appearance did not exhibit: Callus, Crepitus, Excoriation, Fluctuance, Friable, Induration, Localized Edema, Rash, Scarring, Dry/Scaly, Maceration, Atrophie Blanche,  Cyanosis, Hemosiderin Staining, Mottled, Pallor, Rubor, Erythema. Wound #18 status is Open. Original cause of wound was Pressure Injury. The wound is located on the Left Ischial Tuberosity. The wound measures 1.5cm length x 2.5cm width x 1.1cm  depth; 2.945cm^2 area and 3.24cm^3 volume. The wound is limited to skin breakdown. There is no tunneling or undermining noted. There is a large amount of serosanguineous drainage noted. The wound margin is flat and intact. There is large (67-100%) pink granulation within the wound bed. There is a small (1-33%) amount of necrotic tissue within the wound bed including Adherent Slough. The periwound skin appearance exhibited: Moist, Erythema. The periwound skin appearance did not exhibit: Callus, Crepitus, Excoriation, Fluctuance, Friable, Induration, Localized Edema, Rash, Scarring, Dry/Scaly, Maceration, Atrophie Blanche, Cyanosis, Ecchymosis, Hemosiderin Staining, Mottled, Pallor, Rubor. The surrounding wound skin color is noted with erythema which is circumferential. Wound #20 status is Open. Original cause of wound was Pressure Injury. The wound is located on the Left,Dorsal Foot. The wound measures 3.5cm length x 2.7cm width x 0.1cm depth; 7.422cm^2 area and 0.742cm^3 volume. The wound is limited to skin breakdown. There is no tunneling or undermining noted. There is a none present amount of drainage noted. The wound margin is flat and intact. There is no granulation within the wound bed. There is a large (67-100%) amount of necrotic tissue within the wound bed including Eschar. The periwound skin appearance did not exhibit: Callus, Crepitus, Excoriation, Fluctuance, Friable, Induration, Localized Edema, Rash, Scarring, Dry/Scaly, Maceration, Moist, Atrophie Blanche, Cyanosis, Ecchymosis, Hemosiderin Staining, Mottled, Pallor, Rubor, Erythema. Wound #21 status is Open. Original cause of wound was Pressure Injury. The wound is located on the Left,Lateral  Foot. The wound measures 1.2cm length x 2.2cm width x 0.1cm depth; 2.073cm^2 area and 0.207cm^3 volume. The wound is limited to skin breakdown. There is no tunneling or undermining noted. There is a none present amount of drainage noted. The wound margin is flat and intact. There is no granulation within the wound bed. There is a large (67-100%) amount of necrotic tissue within the wound bed including Eschar. The periwound skin appearance exhibited: Induration, Ecchymosis. The periwound skin appearance did not exhibit: Callus, Crepitus, Excoriation, Fluctuance, Friable, Localized Edema, Rash, Scarring, Dry/Scaly, Maceration, Moist, Atrophie Blanche, Cyanosis, Hemosiderin Staining, Mottled, Pallor, Rubor, Erythema. Wound #22 status is Open. Original cause of wound was Pressure Injury. The wound is located on the Right Ischial Tuberosity. The wound measures 1.1cm length x 2.3cm width x 0.1cm depth; 1.987cm^2 area and 0.199cm^3 volume. The wound is limited to skin breakdown. There is no tunneling or undermining noted. There is a medium amount of serous drainage noted. The wound margin is flat and intact. There is no granulation within the wound bed. There is a large (67-100%) amount of necrotic tissue within the wound bed including Adherent Slough. The periwound skin appearance exhibited: Ecchymosis. The periwound skin appearance did not exhibit: Callus, Crepitus, Excoriation, Fluctuance, Friable, Induration, Localized Edema, Rash, Scarring, Dry/Scaly, Maceration, Moist, Atrophie Blanche, Cyanosis, Hemosiderin Staining, Mottled, Pallor, Rubor, Erythema. Wound #9 status is Open. Original cause of wound was Pressure Injury. The wound is located on the Left Calcaneous. The wound measures 3cm length x 3.2cm width x 0.1cm depth; 7.54cm^2 area and 0.754cm^3 volume. There is fat exposed. There is no tunneling or undermining noted. There is a large amount of serosanguineous drainage noted. The wound margin  is flat and intact. There is medium (34- 66%) red granulation within the wound bed. There is a medium (34-66%) amount of necrotic tissue within the wound bed including Eschar and Adherent Slough. The periwound skin appearance exhibited: Moist. The periwound skin appearance did not exhibit: Callus, Crepitus, Excoriation, Fluctuance, Friable, Stamant, Trenee N. ([id number]) Induration, Localized Edema, Rash, Scarring, Dry/Scaly,  Maceration, Atrophie Blanche, Cyanosis, Ecchymosis, Hemosiderin Staining, Mottled, Pallor, Rubor, Erythema. 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 There was a Skin/Subcutaneous Tissue Debridement ([id number]) debridement with total area of 0.7 sq cm performed by [name], MD. with the following instrument(s): Curette to remove Viable and Non-Viable tissue/material including Fibrin/Slough, Eschar, and Subcutaneous after achieving pain control using Lidocaine 4% Topical Solution. A time out was conducted at 16:28, prior to the start of the procedure. A Minimum amount of bleeding was controlled with Pressure. The procedure was tolerated well with a pain level of 0  throughout and a pain level of 0 following the procedure. Post Debridement Measurements: 1cm length x 0.7cm width x 0.3cm depth; 0.165cm^3 volume. Character of Wound/Ulcer Post Debridement is improved. Severity of Tissue Post Debridement is: Fat layer exposed. Post procedure Diagnosis Wound #17: Same as Pre-Procedure Wound #20 Wound #20 is a Pressure Ulcer located on the Left,Dorsal Foot . There was a Skin/Subcutaneous Tissue Debridement ([id number]) debridement with total area of 9.45 sq cm performed by [name], MD. with the following instrument(s): Curette to remove Viable and Non-Viable tissue/material including Fibrin/Slough, Eschar, and Subcutaneous after achieving pain control using Lidocaine 4% Topical Solution. A time out was conducted at 16:31, prior to the start of the procedure. A Minimum amount of bleeding was Dau, Bryndle N. ([id number]) controlled with Pressure. The procedure was tolerated well with a pain level of 0 throughout and a pain level of 0 following the procedure. Post Debridement Measurements: 3.5cm length x 2.7cm width x 0.2cm depth; 1.484cm^3 volume. Post debridement Stage noted as Unstageable/Unclassified. Character of Wound/Ulcer Post Debridement is improved. Severity of Tissue Post Debridement is: Fat layer exposed. Post procedure Diagnosis Wound #20: Same as Pre-Procedure Wound #9 Wound #9 is a Diabetic Wound/Ulcer of the Lower Extremity located on the Left Calcaneous . There was a Skin/Subcutaneous Tissue Debridement ([id number]) debridement with total area of 9.6 sq cm performed by [name], MD. with the following instrument(s): Curette to remove Viable and Non-Viable tissue/material including Fibrin/Slough, Eschar, and Subcutaneous after achieving pain control using Lidocaine 4% Topical Solution. A time out was conducted at 16:33, prior to the start of the procedure. A Minimum amount of bleeding was controlled with Pressure. The  procedure was tolerated well with a pain level of 0 throughout and a pain level of 0 following the procedure. Post Debridement Measurements: 3cm length x 3.2cm width x 0.2cm depth; 1.508cm^3 volume. Character of Wound/Ulcer Post Debridement is improved. Severity of Tissue Post Debridement is: Fat layer exposed. Post procedure Diagnosis Wound #9: Same as Pre-Procedure Plan Wound Cleansing: 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 Cleanse wound with mild soap and water Wound #17 Left Amputation Site -  Transmetatarsal: Clean wound with Normal Saline. Cleanse wound with mild soap and water Wound #18 Left Ischial Tuberosity: Clean wound with Normal Saline. Cleanse wound with mild soap and water Wound #20 Left,Dorsal Foot: Clean wound with Normal Saline. Cleanse wound with mild soap and water Wound #21 Left,Lateral Foot: Clean wound with Normal Saline. Cleanse wound with mild soap and water Wound #22 Right Ischial Tuberosity: Clean wound with Normal Saline. Cleanse wound with mild soap and water Wound #9 Left Calcaneous: Clean wound with Normal Saline. Cleanse wound with mild soap and water Akens, Lorra N. 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,Posterior Amputation Site - Below Knee: Topical Lidocaine 4% cream applied to wound bed prior to debridement Wound #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: Topical Lidocaine 4% cream applied to wound bed prior to debridement Wound #18 Left Ischial Tuberosity: Topical Lidocaine 4% cream applied to wound bed prior to debridement Wound #20 Left,Dorsal Foot: Topical Lidocaine 4% cream applied to wound bed prior to debridement Wound #21 Left,Lateral Foot: Topical Lidocaine 4% cream applied to wound bed prior to debridement Wound #22 Right Ischial Tuberosity: Topical Lidocaine 4% cream applied to wound bed prior to debridement Wound #9 Left Calcaneous: Topical Lidocaine 4% cream applied  to wound bed prior to debridement Primary Wound Dressing: 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,Posterior Amputation Site - Below Knee: Iodoflex Wound #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: Iodoflex Wound #18 Left Ischial Tuberosity: Aquacel Ag Drawtex Wound #20 Left,Dorsal Foot: Iodoflex Wound #21 Left,Lateral Foot: Iodoflex Wound #22 Right Ischial Tuberosity: Boardered Foam Dressing Wound #9 Left Calcaneous: Iodoflex Secondary Dressing: 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,Posterior Amputation Site - Below Knee: Boardered Foam Dressing - on R posterior BKA site wrap lightly with conform Wound #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: Boardered Foam Dressing - on R posterior BKA site wrap lightly with conform Wound #18 Left Ischial Tuberosity: Boardered Foam Dressing - on R posterior BKA site wrap lightly with conform Wound #20 Left,Dorsal Foot: Boardered Foam Dressing - on R posterior BKA site wrap lightly with conform Wound #21 Left,Lateral Foot: Boardered Foam Dressing - on R posterior BKA site wrap lightly with conform Wound #9 Left Calcaneous: Boardered Foam Dressing - on R posterior BKA site wrap lightly with conform Dressing Change Frequency: 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 Other: - as patient is able Wound #18 Left Ischial Tuberosity: Return Appointment in 1 week. Other: - as patient is able Wound #20 Left,Dorsal Foot: Return Appointment in 1 week. Other: - as patient is able Wound #21 Left,Lateral Foot: Return Appointment in 1 week. Other: - as patient is able Wound #22 Right Ischial Tuberosity: Return Appointment in 1 week. Other: - as patient is  able Wound #9 Left Calcaneous: Return Appointment in 1 week. 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 Wound #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: Increase protein intake. Wound #18 Left Ischial Tuberosity: Increase protein intake. Wound #20 Left,Dorsal Foot: Increase protein intake. Wound #21 Left,Lateral Foot: Increase protein intake. Wound #22 Right Ischial Tuberosity: Increase protein intake. Wound #9 Left Calcaneous: Increase protein intake. 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 FACE TO FACE ENCOUNTER: MEDICARE and MEDICAID PATIENTS: I certify that this patient is under Boggess, Ledell Peoples ([id number]) my care and that I had a face-to-face encounter that meets the physician face-to-face encounter requirements with this patient on this date. The encounter with the patient was in whole or in part for the following MEDICAL CONDITION: (primary reason for Home Healthcare) MEDICAL NECESSITY: I certify, that based on my findings,  NURSING services are a medically necessary home health service. HOME BOUND STATUS: I certify that my clinical findings support that this patient is homebound (i.e., Due to illness or injury, pt requires aid of supportive devices such as crutches, cane, wheelchairs, walkers, the use of special transportation or the assistance of another person to leave their place of residence. There is a normal inability to leave the home and doing so requires considerable and taxing effort. Other absences are for medical reasons / religious services and are infrequent or of short duration when for other reasons). If current dressing causes regression in wound condition, may D/C ordered dressing product/s and apply Normal Saline Moist Dressing daily until next Wound Healing Center / Other MD appointment. Notify Wound Healing Center of regression in wound condition at [phone]. Please direct any NON-WOUND related issues/requests for orders to patient's Primary Care Physician Wound #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: Continue Home Health Visits - Please give Skyelyn all the PRN visits that she needs - Also please have PT eval and treat as appropriate Home Health Nurse may visit PRN to address patient s wound care needs. FACE TO FACE ENCOUNTER: MEDICARE and MEDICAID PATIENTS: I certify that this patient is under my care and that I had a face-to-face encounter that meets the physician face-to-face encounter requirements with this patient on this date. The encounter with the patient was in whole or in part for the following MEDICAL CONDITION: (primary reason for Home Healthcare) MEDICAL NECESSITY: I certify, that based on my findings, NURSING services are a medically necessary home health service. HOME BOUND STATUS: I certify that my clinical findings support that this patient is homebound (i.e., Due to illness or injury, pt requires aid of supportive devices such as crutches, cane, wheelchairs, walkers,  the use of special transportation or the assistance of another person to leave their place of residence. There is a normal inability to leave the home and doing so requires considerable and taxing effort. Other absences are for medical reasons / religious services and are infrequent or of short duration when for other reasons). If current dressing causes regression in wound condition, may D/C ordered dressing product/s and apply Normal Saline Moist Dressing daily until next Wound Healing Center / Other MD appointment. Notify Wound Healing Center of regression in wound condition at [phone]. Please direct any NON-WOUND  related issues/requests for orders to patient's Primary Care Physician Wound #18 Left Ischial Tuberosity: Continue Home Health Visits - Please give Mirabel all the PRN visits that she needs - Also please have PT eval and treat as appropriate Home Health Nurse may visit PRN to address patient s wound care needs. FACE TO FACE ENCOUNTER: MEDICARE and MEDICAID PATIENTS: I certify that this patient is under my care and that I had a face-to-face encounter that meets the physician face-to-face encounter requirements with this patient on this date. The encounter with the patient was in whole or in part for the following MEDICAL CONDITION: (primary reason for Home Healthcare) MEDICAL NECESSITY: I certify, that based on my findings, NURSING services are a medically necessary home health service. HOME BOUND STATUS: I certify that my clinical findings support that this patient is homebound (i.e., Due to illness or injury, pt requires aid of supportive devices such as crutches, cane, wheelchairs, walkers, the use of special transportation or the assistance of another person to leave their place of residence. There is a normal inability to leave the home and doing so requires considerable and taxing effort. Other absences are for medical reasons / religious services and are infrequent or of  short duration when for other reasons). If current dressing causes regression in wound condition, may D/C ordered dressing product/s and apply Normal Saline Moist Dressing daily until next Wound Healing Center / Other MD appointment. Notify Wound Healing Center of regression in wound condition at [phone]. Please direct any NON-WOUND related issues/requests for orders to patient's Primary Care Physician Wound #20 Left,Dorsal Foot: Continue Home Health Visits - Please give Brittish all the PRN visits that she needs - Also please have [name], [name] ([id number]) PT eval and treat as appropriate Home Health Nurse may visit PRN to address patient s wound care needs. FACE TO FACE ENCOUNTER: MEDICARE and MEDICAID PATIENTS: I certify that this patient is under my care and that I had a face-to-face encounter that meets the physician face-to-face encounter requirements with this patient on this date. The encounter with the patient was in whole or in part for the following MEDICAL CONDITION: (primary reason for Home Healthcare) MEDICAL NECESSITY: I certify, that based on my findings, NURSING services are a medically necessary home health service. HOME BOUND STATUS: I certify that my clinical findings support that this patient is homebound (i.e., Due to illness or injury, pt requires aid of supportive devices such as crutches, cane, wheelchairs, walkers, the use of special transportation or the assistance of another person to leave their place of residence. There is a normal inability to leave the home and doing so requires considerable and taxing effort. Other absences are for medical reasons / religious services and are infrequent or of short duration when for other reasons). If current dressing causes regression in wound condition, may D/C ordered dressing product/s and apply Normal Saline Moist Dressing daily until next Wound Healing Center / Other MD appointment. Notify Wound Healing Center  of regression in wound condition at [phone]. Please direct any NON-WOUND related issues/requests for orders to patient's Primary Care Physician Wound #21 Left,Lateral Foot: Continue Home Health Visits - Please give Junita all the PRN visits that she needs - Also please have PT eval and treat as appropriate Home Health Nurse may visit PRN to address patient s wound care needs. FACE TO FACE ENCOUNTER: MEDICARE and MEDICAID PATIENTS: I certify that this patient is under my care and that I had a face-to-face encounter that meets  the physician face-to-face encounter requirements with this patient on this date. The encounter with the patient was in whole or in part for the following MEDICAL CONDITION: (primary reason for Home Healthcare) MEDICAL NECESSITY: I certify, that based on my findings, NURSING services are a medically necessary home health service. HOME BOUND STATUS: I certify that my clinical findings support that this patient is homebound (i.e., Due to illness or injury, pt requires aid of supportive devices such as crutches, cane, wheelchairs, walkers, the use of special transportation or the assistance of another person to leave their place of residence. There is a normal inability to leave the home and doing so requires considerable and taxing effort. Other absences are for medical reasons / religious services and are infrequent or of short duration when for other reasons). If current dressing causes regression in wound condition, may D/C ordered dressing product/s and apply Normal Saline Moist Dressing daily until next Wound Healing Center / Other MD appointment. Notify Wound Healing Center of regression in wound condition at [phone]. Please direct any NON-WOUND related issues/requests for orders to patient's Primary Care Physician Wound #22 Right Ischial Tuberosity: Continue Home Health Visits - Please give Maudean all the PRN visits that she needs - Also please have PT eval  and treat as appropriate Home Health Nurse may visit PRN to address patient s wound care needs. FACE TO FACE ENCOUNTER: MEDICARE and MEDICAID PATIENTS: I certify that this patient is under my care and that I had a face-to-face encounter that meets the physician face-to-face encounter requirements with this patient on this date. The encounter with the patient was in whole or in part for the following MEDICAL CONDITION: (primary reason for Home Healthcare) MEDICAL NECESSITY: I certify, that based on my findings, NURSING services are a medically necessary home health service. HOME BOUND STATUS: I certify that my clinical findings support that this patient is homebound (i.e., Due to illness or injury, pt requires aid of supportive devices such as crutches, cane, wheelchairs, walkers, the use of special transportation or the assistance of another person to leave their place of residence. There is a normal inability to leave the home and doing so requires considerable and taxing effort. Other absences are for medical reasons / religious services and are infrequent or of short duration when for other reasons). If current dressing causes regression in wound condition, may D/C ordered dressing product/s and apply Normal Saline Moist Dressing daily until next Wound Healing Center / Other MD appointment. Notify Wound Healing Center of regression in wound condition at [phone]. HENNESSY, BARTEL ([id number]) Please direct any NON-WOUND related issues/requests for orders to patient's Primary Care Physician Wound #9 Left Calcaneous: Continue Home Health Visits - Please give Arliss all the PRN visits that she needs - Also please have PT eval and treat as appropriate Home Health Nurse may visit PRN to address patient s wound care needs. FACE TO FACE ENCOUNTER: MEDICARE and MEDICAID PATIENTS: I certify that this patient is under my care and that I had a face-to-face encounter that meets the physician  face-to-face encounter requirements with this patient on this date. The encounter with the patient was in whole or in part for the following MEDICAL CONDITION: (primary reason for Home Healthcare) MEDICAL NECESSITY: I certify, that based on my findings, NURSING services are a medically necessary home health service. HOME BOUND STATUS: I certify that my clinical findings support that this patient is homebound (i.e., Due to illness or injury, pt requires aid of supportive devices  such as crutches, cane, wheelchairs, walkers, the use of special transportation or the assistance of another person to leave their place of residence. There is a normal inability to leave the home and doing so requires considerable and taxing effort. Other absences are for medical reasons / religious services and are infrequent or of short duration when for other reasons). If current dressing causes regression in wound condition, may D/C ordered dressing product/s and apply Normal Saline Moist Dressing daily until next Wound Healing Center / Other MD appointment. Notify Wound Healing Center of regression in wound condition at [phone]. Please direct any NON-WOUND related issues/requests for orders to patient's Primary Care Physician Negative Pressure Wound Therapy: Wound #18 Left Ischial Tuberosity: Place NPWT on HOLD. [facility] [place] please call [facility] if this wound starts getting worse and NPWT will be restarted To all the left foot/heel wound iodoflex. To the right popliteal fossa wounds iodoflex. both for roughly 2 weeks then o hydrofera osantyl patient has this at home to the left buttock silver alginate rope packing Electronic Signature(s) Signed: 10/01/2016 4:23:41 PM By: Elliot Gurney RN, [name], Kim RN, [name] Signed: 10/06/2016 4:24:12 PM By: Baltazar Najjar MD Previous Signature: 09/30/2016 5:37:12 PM Version By: Baltazar Najjar MD [name] ([id number]) Entered By: Elliot Gurney RN, [name], Kim on 10/01/2016  16:19:39 VENOLA, CASTELLO ([id number]) -------------------------------------------------------------------------------- SuperBill Details [name] Date of Service: 09/30/2016 Patient Name: N. Patient Account Number: [id number] Medical Record Treating RN: [name] [id number] Number: Other Clinician: 09/17/1981 (36 y.o. 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, right lower extremity S71.101A Unspecified open wound, right thigh, initial encounter L89.322 Pressure ulcer of left buttock, stage 2 L89.899 Pressure ulcer of other site, unspecified stage L89.223 Pressure ulcer of left hip, stage 3 Facility Procedures CPT4 Code: [id number] Description: 11042 - DEB SUBQ TISSUE 20 SQ CM/< ICD-10 Description Diagnosis E10.621 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 ([id number]) Modifier: Quantity: 1 Electronic Signature(s) Signed: 09/30/2016 5:37:12 PM By: Baltazar Najjar MD Entered By:  Baltazar Najjar on 09/30/2016 17:15:00

## 2016-10-07 ENCOUNTER — Ambulatory Visit: Payer: Medicare Other | Admitting: Physical Therapy

## 2016-10-08 ENCOUNTER — Ambulatory Visit: Payer: Medicare Other | Admitting: Physical Therapy

## 2016-10-14 ENCOUNTER — Ambulatory Visit: Payer: Medicare Other | Admitting: Internal Medicine

## 2016-10-20 ENCOUNTER — Ambulatory Visit: Payer: Medicare Other | Admitting: Internal Medicine

## 2016-10-27 ENCOUNTER — Ambulatory Visit: Payer: Medicare Other | Admitting: Internal Medicine

## 2016-10-28 ENCOUNTER — Ambulatory Visit: Payer: Medicare Other | Admitting: Internal Medicine

## 2016-11-04 ENCOUNTER — Ambulatory Visit: Payer: Medicare Other | Admitting: Internal Medicine

## 2016-11-11 ENCOUNTER — Encounter: Payer: Medicare Other | Attending: Internal Medicine | Admitting: Internal Medicine

## 2016-11-11 DIAGNOSIS — E1052 Type 1 diabetes mellitus with diabetic peripheral angiopathy with gangrene: Secondary | ICD-10-CM | POA: Diagnosis not present

## 2016-11-11 DIAGNOSIS — L89322 Pressure ulcer of left buttock, stage 2: Secondary | ICD-10-CM | POA: Diagnosis not present

## 2016-11-11 DIAGNOSIS — Z89511 Acquired absence of right leg below knee: Secondary | ICD-10-CM | POA: Diagnosis not present

## 2016-11-11 DIAGNOSIS — I70245 Atherosclerosis of native arteries of left leg with ulceration of other part of foot: Secondary | ICD-10-CM | POA: Insufficient documentation

## 2016-11-11 DIAGNOSIS — E1022 Type 1 diabetes mellitus with diabetic chronic kidney disease: Secondary | ICD-10-CM | POA: Insufficient documentation

## 2016-11-11 DIAGNOSIS — Z992 Dependence on renal dialysis: Secondary | ICD-10-CM | POA: Insufficient documentation

## 2016-11-11 DIAGNOSIS — N186 End stage renal disease: Secondary | ICD-10-CM | POA: Insufficient documentation

## 2016-11-11 DIAGNOSIS — S71101A Unspecified open wound, right thigh, initial encounter: Secondary | ICD-10-CM | POA: Insufficient documentation

## 2016-11-11 DIAGNOSIS — I70261 Atherosclerosis of native arteries of extremities with gangrene, right leg: Secondary | ICD-10-CM | POA: Insufficient documentation

## 2016-11-11 DIAGNOSIS — L89622 Pressure ulcer of left heel, stage 2: Secondary | ICD-10-CM | POA: Diagnosis not present

## 2016-11-11 DIAGNOSIS — T87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, right lower extremity: Secondary | ICD-10-CM | POA: Insufficient documentation

## 2016-11-11 DIAGNOSIS — L89223 Pressure ulcer of left hip, stage 3: Secondary | ICD-10-CM | POA: Diagnosis not present

## 2016-11-11 DIAGNOSIS — E10621 Type 1 diabetes mellitus with foot ulcer: Secondary | ICD-10-CM | POA: Insufficient documentation

## 2016-11-11 DIAGNOSIS — X58XXXA Exposure to other specified factors, initial encounter: Secondary | ICD-10-CM | POA: Diagnosis not present

## 2016-11-12 ENCOUNTER — Other Ambulatory Visit
Admission: RE | Admit: 2016-11-12 | Discharge: 2016-11-12 | Disposition: A | Discharge status: Home / Self Care | Payer: Medicare Other | Source: Ambulatory Visit | Attending: Internal Medicine | Admitting: Internal Medicine

## 2016-11-12 ENCOUNTER — Inpatient Hospital Stay: Admit: 2016-11-12 | Discharge: 2016-11-12 | Disposition: A | Discharge status: Home / Self Care | Payer: Self-pay

## 2016-11-12 DIAGNOSIS — B999 Unspecified infectious disease: Secondary | ICD-10-CM | POA: Diagnosis present

## 2016-11-14 NOTE — Progress Notes (Signed)
[name], [name] ([id number]) Visit Report for 11/11/2016 Arrival Information Details [name] Date of Service: 11/11/2016 2:45 PM Patient Name: N. Patient Account Number: [id number] Medical Record Treating RN: Phillis Haggis [id number] Number: Other Clinician: 1981-01-24 (36 y.o. Treating [name], Wu Date of Birth/Sex: Female) [name]/Extender: G Primary Care [name]: [name] Referring [name]: [name] in Treatment: 40 Visit Information History Since Last Visit All ordered tests and consults were completed: No Patient Arrived: Wheel Chair Added or deleted any medications: No Arrival Time: 15:35 Any new allergies or adverse reactions: No Accompanied By: mother Had a fall or experienced change in No activities of daily living that may affect Transfer Assistance: None risk of falls: Patient Identification Verified: Yes Signs or symptoms of abuse/neglect since last No Secondary Verification Process Yes visito Completed: Hospitalized since last visit: No Patient Requires Transmission-Based No Has Dressing in Place as Prescribed: Yes Precautions: Pain Present Now: Yes Patient Has Alerts: Yes Electronic Signature(s) Signed: 11/13/2016 4:03:50 PM By: [name] Entered By: [name] on 11/11/2016 15:35:36 [name] ([id number]) -------------------------------------------------------------------------------- Clinic Level of Care Assessment Details [name] Date of Service: 11/11/2016 2:45 PM Patient Name: N. Patient Account Number: [id number] Medical Record Treating RN: Phillis Haggis [id number] Number: Other Clinician: 19-Dec-1980 (36 y.o. Treating [name], Wu Date of Birth/Sex: Female) [name]/Extender: G Primary Care [name]: [name] Referring Janiel Crisostomo: [name] in Treatment: 79 Clinic Level of Care Assessment Items TOOL 4 Quantity Score X - Use when only an EandM is performed  on FOLLOW-UP visit 1 0 ASSESSMENTS - Nursing Assessment / Reassessment X - Reassessment of Co-morbidities (includes updates in patient status) 1 10 X - Reassessment of Adherence to Treatment Plan 1 5 ASSESSMENTS - Wound and Skin Assessment / Reassessment []  - Simple Wound Assessment / Reassessment - one wound 0 X - Complex Wound Assessment / Reassessment - multiple wounds 5 5 []  - Dermatologic / Skin Assessment (not related to wound area) 0 ASSESSMENTS - Focused Assessment []  - Circumferential Edema Measurements - multi extremities 0 []  - Nutritional Assessment / Counseling / Intervention 0 []  - Lower Extremity Assessment (monofilament, tuning fork, pulses) 0 []  - Peripheral Arterial Disease Assessment (using hand held doppler) 0 ASSESSMENTS - Ostomy and/or Continence Assessment and Care []  - Incontinence Assessment and Management 0 []  - Ostomy Care Assessment and Management (repouching, etc.) 0 PROCESS - Coordination of Care []  - Simple Patient / Family Education for ongoing care 0 X - Complex (extensive) Patient / Family Education for ongoing care 1 20 X - Staff obtains Consents, Records, Test Results / Process Orders 1 10 [name], [name] ([id number]) X - Staff telephones HHA, Nursing Homes / Clarify orders / etc 1 10 []  - Routine Transfer to another Facility (non-emergent condition) 0 []  - Routine Hospital Admission (non-emergent condition) 0 []  - New Admissions / Manufacturing engineer / Ordering NPWT, Apligraf, etc. 0 []  - Emergency Hospital Admission (emergent condition) 0 X - Simple Discharge Coordination 1 10 []  - Complex (extensive) Discharge Coordination 0 PROCESS - Special Needs []  - Pediatric / Minor Patient Management 0 []  - Isolation Patient Management 0 []  - Hearing / Language / Visual special needs 0 []  - Assessment of Community assistance (transportation, D/C planning, etc.) 0 []  - Additional assistance / Altered mentation 0 []  - Support Surface(s) Assessment  (bed, cushion, seat, etc.) 0 INTERVENTIONS - Wound Cleansing / Measurement []  - Simple Wound Cleansing - one wound 0 X - Complex Wound Cleansing - multiple wounds 5 5 X -  Wound Imaging (photographs - any number of wounds) 1 5 []  - Wound Tracing (instead of photographs) 0 []  - Simple Wound Measurement - one wound 0 X - Complex Wound Measurement - multiple wounds 5 5 INTERVENTIONS - Wound Dressings []  - Small Wound Dressing one or multiple wounds 0 X - Medium Wound Dressing one or multiple wounds 5 15 []  - Large Wound Dressing one or multiple wounds 0 X - Application of Medications - topical 1 5 []  - Application of Medications - injection 0 [name], [name] ([id number]) INTERVENTIONS - Miscellaneous []  - External ear exam 0 []  - Specimen Collection (cultures, biopsies, blood, body fluids, etc.) 0 []  - Specimen(s) / Culture(s) sent or taken to Lab for analysis 0 []  - Patient Transfer (multiple staff / Michiel Sites Lift / Similar devices) 0 []  - Simple Staple / Suture removal (25 or less) 0 []  - Complex Staple / Suture removal (26 or more) 0 []  - Hypo / Hyperglycemic Management (close monitor of Blood Glucose) 0 []  - Ankle / Brachial Index (ABI) - do not check if billed separately 0 []  - Vital Signs 0 Has the patient been seen at the hospital within the last three years: Yes Total Score: 225 Level Of Care: New/Established - Level 5 Electronic Signature(s) Signed: 11/13/2016 4:03:50 PM By: [name] Entered By: [name] on 11/11/2016 17:45:52 [name] ([id number]) -------------------------------------------------------------------------------- Encounter Discharge Information Details [name] Date of Service: 11/11/2016 2:45 PM Patient Name: N. Patient Account Number: [id number] Medical Record Treating RN: Phillis Haggis [id number] Number: Other Clinician: April 06, 1981 (36 y.o. Treating [name], Wu Date of Birth/Sex: Female) [name]/Extender:  G Primary Care Ariana Juul: [name] Referring Katisha Shimizu: [name] in Treatment: 41 Encounter Discharge Information Items Discharge Pain Level: 10 Discharge Condition: Stable Ambulatory Status: Wheelchair Discharge Destination: Home Transportation: Private Auto Accompanied By: mother Schedule Follow-up Appointment: Yes Medication Reconciliation completed and provided to Patient/Care No Kriston Pasquarello: Provided on Clinical Summary of Care: 11/11/2016 Form Type Recipient Paper Patient HB Electronic Signature(s) Signed: 11/13/2016 4:03:50 PM By: [name] Previous Signature: 11/11/2016 4:54:52 PM Version By: Gwenlyn Perking Entered By: [name] on 11/11/2016 17:22:06 [name] ([id number]) -------------------------------------------------------------------------------- General Visit Notes Details [name] Date of Service: 11/11/2016 2:45 PM Patient Name: N. Patient Account Number: [id number] Medical Record Treating RN: Phillis Haggis [id number] Number: Other Clinician: Jan 15, 1981 (36 y.o. Treating [name], Wu Date of Birth/Sex: Female) Alynah Schone/Extender: G Primary Care Trina Asch: [name] Referring Emmerie Battaglia: [name] in Treatment: 60 Notes unable to view all wounds d/t the pts condition she was in pain and could only be seen in her chair. Electronic Signature(s) Signed: 11/13/2016 4:03:50 PM By: [name] Entered By: [name] on 11/11/2016 17:24:35 [name] ([id number]) -------------------------------------------------------------------------------- Lower Extremity Assessment Details [name] Date of Service: 11/11/2016 2:45 PM Patient Name: N. Patient Account Number: [id number] Medical Record Treating RN: Phillis Haggis [id number] Number: Other Clinician: [birth year] (36 y.o. Treating [name], Wu Date of Birth/Sex: Female) Kerem Gilmer/Extender: G Primary Care Dat Derksen:  [name] Referring Xinyi Batton: [name] in Treatment: 62 Vascular Assessment Pulses: Dorsalis Pedis Palpable: [Left:Yes] Posterior Tibial Extremity colors, hair growth, and conditions: Extremity Color: [Left:Normal] Temperature of Extremity: [Left:Warm] Electronic Signature(s) Signed: 11/13/2016 4:03:50 PM By: [name] Entered By: [name] on 11/11/2016 16:01:23 [name] ([id number]) -------------------------------------------------------------------------------- Multi Wound Chart Details [name] Date of Service: 11/11/2016 2:45 PM Patient Name: N. Patient Account Number: [id number] Medical Record Treating RN: Phillis Haggis [id number] Number: Other Clinician: 07/09/1981 (36 y.o. 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] Wounding Event: [16:Pressure Injury] [17:Surgical Injury] [20:Pressure Injury] Primary Etiology: [16:Pressure Ulcer] [17:Open Surgical Wound] [20:Pressure Ulcer] Comorbid History: [16:Anemia, Type I Diabetes, Anemia, Type I Diabetes, Anemia, Type I Diabetes, End Stage Renal Disease, End Stage Renal Disease, End Stage Renal Disease, Rheumatoid Arthritis, Neuropathy] [17:Rheumatoid Arthritis, Neuropathy]  [20:Rheumatoid Arthritis, Neuropathy] Date Acquired: [16:04/02/2016] [17:04/13/2016] [20:08/06/2016] Weeks of Treatment: [16:31] [17:28] [20:9] Wound Status: [16:Open] [17:Healed - Epithelialized Open] Measurements L x W x D 5x4.5x0.7 [17:0x0x0] [20:3.4x4x0.1] (cm) Area (cm) : [16:17.671] [17:0] [20:10.681] Volume (cm) : [16:12.37] [17:0] [20:1.068] % Reduction in Area: [16:-197.60%] [17:100.00%]  [20:-529.80%] % Reduction in Volume: -1982.50% [17:100.00%] [20:-528.20%] Classification: [16:Category/Stage IV] [17:Full Thickness Without Unstageable/Unclassified Exposed Support Structures] HBO Classification: [16:Grade 1] [17:Grade 1] [20:Grade 2] Exudate Amount: [16:Large] [17:None Present] [20:None Present] Exudate Type: [16:Purulent] [17:N/A] [20:N/A] Exudate Color: [16:yellow, brown, green] [17:N/A] [20:N/A] Foul Odor After [16:Yes] [17:No] [20:No] Cleansing: Odor Anticipated Due to No [17:N/A] [20:N/A] Product Use: Wound Margin: [16:Distinct, outline attached Flat and Intact] [20:Flat and Intact] Granulation Amount: [16:Medium (34-66%)] [17:None Present (0%)] [20:None Present (0%)] Granulation Quality: [16:Pink] [17:N/A] [20:N/A] Necrotic Amount: [16:Medium (34-66%)] [17:None Present (0%)] [20:Large (67-100%)] Necrotic Tissue: [16:Eschar, Adherent Slough N/A] [20:Eschar, Adherent Slough] Exposed Structures: [name], [name] ([id number]) Fat Layer (Subcutaneous Fascia: No Fascia: No Tissue) Exposed: Yes Fat Layer (Subcutaneous Fat Layer (Subcutaneous Tendon: Yes Tissue) Exposed: No Tissue) Exposed: No Fascia: No Tendon: No Tendon: No Muscle: No Muscle: No Muscle: No Joint: No Joint: No Joint: No Bone: No Bone: No Bone: No Limited to Skin Limited to Skin Breakdown Breakdown Epithelialization: None Large (67-100%) None Periwound Skin Texture: Induration: Yes Excoriation: No Excoriation: No Excoriation: No Induration: No Induration: No Callus: No Callus: No Callus: No Crepitus: No Crepitus: No Crepitus: No Rash: No Rash: No Rash: No Scarring: No Scarring: No Scarring: No Periwound Skin Maceration: No Maceration: No Maceration: No Moisture: Dry/Scaly: No Dry/Scaly: No Dry/Scaly: No Periwound Skin Color: Erythema: Yes Ecchymosis: Yes Atrophie Blanche: No Atrophie Blanche: No Atrophie Blanche: No Cyanosis: No Cyanosis: No Cyanosis:  No Ecchymosis: No Ecchymosis: No Erythema: No Erythema: No Hemosiderin Staining: No Hemosiderin Staining: No Hemosiderin Staining: No Mottled: No Mottled: No Mottled: No Pallor: No Pallor: No Pallor: No Rubor: No Rubor: No Rubor: No Erythema Location: Circumferential N/A N/A Erythema Change: No Change N/A N/A Temperature: No Abnormality N/A N/A Tenderness on Yes No No Palpation: Wound Preparation: Ulcer Cleansing: Ulcer Cleansing: Ulcer Cleansing: Rinsed/Irrigated with Rinsed/Irrigated with Rinsed/Irrigated with Saline Saline Saline Topical Anesthetic Topical Anesthetic Topical Anesthetic Applied: Other: lidocaine Applied: None Applied: Other: lidocaine 4% 4% Wound Number: [id number]  Photos: No Photos No Photos No Photos Wound Location: Left Foot - Lateral Right Amputation Site - Left Calcaneous Above Knee - Lateral Wounding Event: Pressure Injury Pimple Pressure Injury 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 ([name]) Date Acquired: 08/06/2016 11/09/2016 07/28/2015 Weeks of Treatment: 9 0 62 Wound Status: Open Open Open Measurements L x W x D 1.1x1.4x0.1 0.2x0.2x0.4 3x2x0.1 (cm) Area (cm) :  1.21 0.031 4.712 Volume (cm) : 0.121 0.013 0.471 % Reduction in Area: 23.00% N/A 74.60% % Reduction in Volume: 22.90% N/A 74.60% Classification: Unstageable/Unclassified Full Thickness Without Grade 2 Exposed Support Structures HBO Classification: Grade 1 Grade 1 N/A Exudate Amount: Small Large Large Exudate Type: Serous Purulent Serosanguineous Exudate Color: amber yellow, brown, green red, brown Foul Odor After No No No Cleansing: Odor  Anticipated Due to N/A N/A N/A Product Use: Wound Margin: Flat and Intact Flat and Intact Flat and Intact Granulation Amount: None Present (0%) None Present (0%) Medium (34-66%) Granulation Quality: N/A N/A Red Necrotic Amount: Large (67-100%) None Present (0%) Medium (34-66%) Necrotic Tissue: Eschar, Adherent Slough N/A Eschar, Adherent Slough Exposed Structures: Fascia: No Fascia: No Fat Layer (Subcutaneous Fat Layer (Subcutaneous Fat Layer (Subcutaneous Tissue) Exposed: Yes Tissue) Exposed: No Tissue) Exposed: No Fascia: No Tendon: No Tendon: No Tendon: No Muscle: No Muscle: No Muscle: No Joint: No Joint: No Joint: No Bone: No Bone: No Bone: No Limited to Skin Limited to Skin Breakdown Breakdown Epithelialization: None None None Periwound Skin Texture: Induration: Yes Excoriation: No Excoriation: No Excoriation: No Induration: No Induration: No Callus: No Callus: No Callus: No Crepitus: No Crepitus: No Crepitus: No Rash: No Rash: No Rash: No Scarring: No Scarring: No Scarring: No Periwound Skin Maceration: No Maceration: No Maceration: No Moisture: Dry/Scaly: No Dry/Scaly: No Dry/Scaly: No Periwound Skin Color: Ecchymosis: Yes Erythema: Yes Atrophie Blanche: No Atrophie Blanche: No Atrophie Blanche: No Cyanosis: No Cyanosis: No Cyanosis: No Ecchymosis: No Erythema: No Ecchymosis: No Erythema: No Hemosiderin Staining: No Hemosiderin Staining: No Hemosiderin Staining: No Mottled: No Mottled: No Mottled: No Jezewski, [name] ([id number]) Pallor: No Pallor: No Pallor: No Rubor: No Rubor: No Rubor: No Erythema Location: N/A Circumferential N/A Erythema Change: N/A N/A N/A Temperature: N/A No Abnormality N/A Tenderness on No Yes No Palpation: Wound Preparation: Ulcer Cleansing: Ulcer Cleansing: Ulcer Cleansing: Rinsed/Irrigated with Rinsed/Irrigated with Rinsed/Irrigated with Saline Saline Saline Topical Anesthetic Topical  Anesthetic Topical Anesthetic Applied: Other: lidocaine Applied: None Applied: Other: lidocaine 4% 4% Treatment Notes Electronic Signature(s) Signed: 11/11/2016 5:24:26 PM By: Baltazar Najjar MD Entered By: Baltazar Najjar on 11/11/2016 17:11:27 [name] ([id number]) -------------------------------------------------------------------------------- Pain Assessment Details [name] Date of Service: 11/11/2016 2:45 PM Patient Name: N. Patient Account Number: [id number] Medical Record Treating RN: Phillis Haggis [id number] Number: Other Clinician: 11/25/1980 (35 y.o. Treating [name], Wu Date of Birth/Sex: Female) Milledge Gerding/Extender: G Primary Care Marithza Malachi: [name] Referring Victor Langenbach: [name] in Treatment: 7 Active Problems Location of Pain Severity and Description of Pain Patient Has Paino Yes Site Locations With Dressing Change: Yes Duration of the Pain. Constant / Intermittento Constant Rate the pain. Current Pain Level: 10 Worst Pain Level: 10 Character of Pain Describe the Pain: Aching Pain Management and Medication Current Pain Management: Electronic Signature(s) Signed: 11/13/2016 4:03:50 PM By: [name] Entered By: [name] on 11/11/2016 15:35:54 [name] ([id number]) -------------------------------------------------------------------------------- Patient/Caregiver Education Details [name] Date of Service: 11/11/2016 2:45 PM Patient Name: N. Patient Account Number: [id number] Medical Record Treating RN: Phillis Haggis [id number] Number: Other Clinician: 1981-05-03 (37 y.o. Treating [name], Wu Date of Birth/Gender: Female) Physician/Extender: G Primary Care Weeks in Treatment: 23 [name] Physician: Referring Physician: Rolin Wu Education Assessment Education Provided To: Patient Education Topics Provided Wound/Skin Impairment: Handouts: Other: change  dressings as directed Methods: Demonstration, Explain/Verbal Responses: State content correctly Electronic Signature(s) Signed: 11/13/2016 4:03:50 PM By: [name] Entered By: [name] on 11/11/2016 17:22:21 [name] ([id number]) --------------------------------------------------------------------------------  Wound Assessment Details GREY, SCHLAUCH Date of Service: 11/11/2016 2:45 PM Patient Name: N. Patient Account Number: [id number] Medical Record Treating RN: Phillis Haggis [id number] Number: Other Clinician: [birth year] (36 y.o. 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 Wound Open Status: Wounding Event: Pressure Injury Comorbid Anemia, Type I Diabetes, End Stage Date Acquired: 04/02/2016 History: Renal Disease, Rheumatoid Arthritis, Weeks Of Treatment: 31 Neuropathy Clustered Wound: No Photos Photo Uploaded By: [name] on 11/12/2016 07:45:47 Wound Measurements Length: (cm) 5 Width: (cm) 4.5 Depth: (cm) 0.7 Area: (cm) 17.671 Volume: (cm) 12.37 % Reduction in Area: -197.6% % Reduction in Volume: -1982.5% Epithelialization: None Tunneling: No Undermining: No Wound Description Classification: Category/Stage IV Foul O Diabetic Severity (Wagner): Grade 1 Due to Wound Margin: Distinct, outline attached Slough Exudate Amount: Large Exudate Type: Purulent Exudate Color: yellow, brown, green dor After Cleansing: Yes Product Use: No /Fibrino Yes Wound Bed Tomey, Nabeeha N. ([id number]) Granulation Amount: Medium (34-66%) Exposed Structure Granulation Quality: Pink Fascia Exposed: No Necrotic Amount: Medium (34-66%) Fat Layer (Subcutaneous Tissue) Exposed: Yes Necrotic  Quality: Eschar, Adherent Slough Tendon Exposed: Yes Muscle Exposed: No Joint Exposed: No Bone Exposed: No Periwound Skin Texture Texture Color No Abnormalities Noted: No No Abnormalities Noted: No Callus: No Atrophie Blanche: No Crepitus: No Cyanosis: No Excoriation: No Ecchymosis: No Induration: Yes Erythema: Yes Rash: No Erythema Location: Circumferential Scarring: No Erythema Change: No Change Hemosiderin Staining: No Moisture Mottled: No No Abnormalities Noted: No Pallor: No Dry / Scaly: No Rubor: No Maceration: No Temperature / Pain Temperature: No Abnormality Tenderness on Palpation: Yes Wound Preparation Ulcer Cleansing: Rinsed/Irrigated with Saline Topical Anesthetic Applied: Other: lidocaine 4%, Treatment Notes 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 Cleansed with: Clean wound with Normal Saline 2. Anesthetic Topical Lidocaine 4% cream to wound bed prior to debridement 4. Dressing Applied: Hydrogel Other dressing (specify in notes) 5. Secondary Dressing Applied Bordered Foam Dressing Dry Gauze Notes cutimed sorbact Electronic Signature(s) Signed: 11/13/2016 4:03:50 PM By: Valera Castle, [name] ([id number]) Entered By: [name] on 11/11/2016 15:49:57 [name] ([id number]) -------------------------------------------------------------------------------- Wound Assessment Details [name] Date of Service: 11/11/2016 2:45 PM Patient Name: N. Patient Account Number: [id number] Medical Record Treating RN: Phillis Haggis [id number] Number: Other Clinician: 07-May-1981 (36 y.o. Treating [name], Wu Date of Birth/Sex: Female) Jill Ruppe/Extender: G Primary Care Maya Scholer: [name] Referring Kaytelynn [name]: [name] in Treatment: 63 Wound Status Wound Number: 17 Primary Open Surgical Wound Etiology: Wound Location: Left Amputation Site - Transmetatarsal Wound Healed -  Epithelialized Status: Wounding Event: Surgical Injury Comorbid Anemia, Type I Diabetes, End Stage Date Acquired: 04/13/2016 History: Renal Disease, Rheumatoid Arthritis, Weeks Of Treatment: 28 Neuropathy Clustered Wound: No Photos Photo Uploaded By: [name] on 11/12/2016 07:45:47 Wound Measurements Length: (cm) 0 % Reduction in Width: (cm) 0 % Reduction in Depth: (cm) 0 Epithelializati Area: (cm) 0 Tunneling: Volume: (cm) 0 Undermining: Area: 100% Volume: 100% on: Large (67-100%) No No Wound Description Full Thickness Without Classification: Exposed Support Structures Diabetic Severity Grade 1 (Wagner): Wound Margin: Flat and Intact Exudate Amount: None Present Foul Odor After Cleansing: No Wound Bed Streets, Doneta N. ([id number]) Granulation Amount: None Present (0%) Exposed Structure Necrotic Amount: None Present (0%) Fascia Exposed: No Fat Layer (Subcutaneous Tissue) Exposed: No Tendon Exposed: No Muscle Exposed: No Joint  Exposed: No Bone Exposed: No Limited to Skin Breakdown Periwound Skin Texture Texture Color No Abnormalities Noted: No No Abnormalities Noted: No Callus: No Atrophie Blanche: No Crepitus: No Cyanosis: No Excoriation: No Ecchymosis: Yes Induration: No Erythema: No Rash: No Hemosiderin Staining: No Scarring: No Mottled: No Pallor: No Moisture Rubor: No No Abnormalities Noted: No Dry / Scaly: No Maceration: No Wound Preparation Ulcer Cleansing: Rinsed/Irrigated with Saline Topical Anesthetic Applied: None Electronic Signature(s) Signed: 11/13/2016 4:03:50 PM By: [name] Entered By: [name] on 11/11/2016 16:08:51 [name] ([id number]) -------------------------------------------------------------------------------- Wound Assessment Details [name] Date of Service: 11/11/2016 2:45 PM Patient Name: N. Patient Account Number: [id number] Medical Record Treating RN:  Phillis Haggis [id number] Number: Other Clinician: [birth year] (36 y.o. Treating [name], Wu Date of Birth/Sex: Female) Chantee Cerino/Extender: G Primary Care Benyamin Jeff: [name] Referring Caris Cerveny: [name] in Treatment: 64 Wound Status Wound Number: 20 Primary Pressure Ulcer Etiology: Wound Location: Left Foot - Dorsal Wound Open Wounding Event: Pressure Injury Status: Date Acquired: 08/06/2016 Comorbid Anemia, Type I Diabetes, End Stage Weeks Of Treatment: 9 History: Renal Disease, Rheumatoid Arthritis, Clustered Wound: No Neuropathy Photos Photo Uploaded By: [name] on 11/12/2016 07:46:41 Wound Measurements Length: (cm) 3.4 Width: (cm) 4 Depth: (cm) 0.1 Area: (cm) 10.681 Volume: (cm) 1.068 % Reduction in Area: -529.8% % Reduction in Volume: -528.2% Epithelialization: None Tunneling: No Undermining: No Wound Description Classification: Unstageable/Unclassified Foul Od Diabetic Severity (Wagner): Grade 2 Slough/ Wound Margin: Flat and Intact Exudate Amount: None Present or After Cleansing: No Fibrino No Wound Bed Granulation Amount: None Present (0%) Exposed Structure Necrotic Amount: Large (67-100%) Fascia Exposed: No [name], [name] ([id number]) Necrotic Quality: Eschar, Adherent Slough Fat Layer (Subcutaneous Tissue) Exposed: No Tendon Exposed: No Muscle Exposed: No Joint Exposed: No Bone Exposed: No Limited to Skin Breakdown Periwound Skin Texture Texture Color No Abnormalities Noted: No No Abnormalities Noted: No Callus: No Atrophie Blanche: No Crepitus: No Cyanosis: No Excoriation: No Ecchymosis: No Induration: No Erythema: No Rash: No Hemosiderin Staining: No Scarring: No Mottled: No Pallor: No Moisture Rubor: No No Abnormalities Noted: No Dry / Scaly: No Maceration: No Wound Preparation Ulcer Cleansing: Rinsed/Irrigated with Saline Topical Anesthetic Applied: Other: lidocaine 4%, Treatment  Notes Wound #20 (Left, Dorsal Foot) 1. Cleansed with: Clean wound with Normal Saline 2. Anesthetic Topical Lidocaine 4% cream to wound bed prior to debridement 4. Dressing Applied: Iodoflex 5. Secondary Dressing Applied ABD Pad Dry Gauze Kerlix/Conform 7. Secured with Secretary/administrator) Signed: 11/13/2016 4:03:50 PM By: [name] Entered By: [name] on 11/11/2016 15:59:57 [name] ([id number]) -------------------------------------------------------------------------------- Wound Assessment Details [name] Date of Service: 11/11/2016 2:45 PM Patient Name: N. Patient Account Number: [id number] Medical Record Treating RN: Phillis Haggis [id number] Number: Other Clinician: [birth year] (36 y.o. Treating [name], Wu Date of Birth/Sex: Female) Ysmael Hires/Extender: G Primary Care Waldemar Siegel: [name] Referring Kareen Jefferys: [name] in Treatment: 33 Wound Status Wound Number: 21 Primary Pressure Ulcer Etiology: Wound Location: Left Foot - Lateral Wound Open Wounding Event: Pressure Injury Status: Date Acquired: 08/06/2016 Comorbid Anemia, Type I Diabetes, End Stage Weeks Of Treatment: 9 History: Renal Disease, Rheumatoid Arthritis, Clustered Wound: No Neuropathy Photos Photo Uploaded By: [name] on 11/12/2016 07:46:42 Wound Measurements Length: (cm) 1.1 Width: (cm) 1.4 Depth: (cm) 0.1 Area: (cm) 1.21 Volume: (cm) 0.121 % Reduction in Area: 23% % Reduction in Volume: 22.9% Epithelialization: None Tunneling: No Undermining: No Wound Description Classification: Unstageable/Unclassified Foul Od Diabetic Severity (Wagner): Grade 1 Wound Margin: Flat and Intact Exudate  Amount: Small Exudate Type: Serous Exudate Color: amber or After Cleansing: No Wound Bed [name], [name] ([id number]) Granulation Amount: None Present (0%) Exposed Structure Necrotic Amount: Large (67-100%) Fascia Exposed:  No Necrotic Quality: Eschar, Adherent Slough Fat Layer (Subcutaneous Tissue) Exposed: No Tendon Exposed: No Muscle Exposed: No Joint Exposed: No Bone Exposed: No Limited to Skin Breakdown Periwound Skin Texture Texture Color No Abnormalities Noted: No No Abnormalities Noted: No Callus: No Atrophie Blanche: No Crepitus: No Cyanosis: No Excoriation: No Ecchymosis: Yes Induration: Yes Erythema: No Rash: No Hemosiderin Staining: No Scarring: No Mottled: No Pallor: No Moisture Rubor: No No Abnormalities Noted: No Dry / Scaly: No Maceration: No Wound Preparation Ulcer Cleansing: Rinsed/Irrigated with Saline Topical Anesthetic Applied: Other: lidocaine 4%, Treatment Notes Wound #21 (Left, Lateral Foot) 1. Cleansed with: Clean wound with Normal Saline 2. Anesthetic Topical Lidocaine 4% cream to wound bed prior to debridement 4. Dressing Applied: Iodoflex 5. Secondary Dressing Applied ABD Pad Dry Gauze Kerlix/Conform 7. Secured with Secretary/administrator) Signed: 11/13/2016 4:03:50 PM By: [name] Entered By: [name] on 11/11/2016 15:58:45 [name] ([id number]) [name], [name] ([id number]) -------------------------------------------------------------------------------- Wound Assessment Details [name] Date of Service: 11/11/2016 2:45 PM Patient Name: N. Patient Account Number: [id number] Medical Record Treating RN: Phillis Haggis [id number] Number: Other Clinician: 27-Jan-1981 (36 y.o. 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 Wound Open Status: Wounding Event: Pimple Comorbid Anemia, Type I Diabetes, End Stage Date Acquired: 11/09/2016 History: Renal Disease,  Rheumatoid Arthritis, Weeks Of Treatment: 0 Neuropathy Clustered Wound: No Photos Photo Uploaded By: [name] on 11/12/2016 07:47:34 Wound Measurements Length: (cm) 0.2 Width: (cm) 0.2 Depth: (cm) 0.4 Area: (cm) 0.031 Volume: (cm) 0.013 % Reduction in Area: % Reduction in Volume: Epithelialization: None Tunneling: No Undermining: No Wound Description Full Thickness Without Classification: Exposed Support Structures Diabetic Severity Grade 1 (Wagner): Wound Margin: Flat and Intact Exudate Amount: Large Exudate Type: Purulent Exudate Color: yellow, brown, green [name], [name] ([id number]) Foul Odor After Cleansing: No Slough/Fibrino No Wound Bed Granulation Amount: None Present (0%) Exposed Structure Necrotic Amount: None Present (0%) Fascia Exposed: No Fat Layer (Subcutaneous Tissue) Exposed: No Tendon Exposed: No Muscle Exposed: No Joint Exposed: No Bone Exposed: No Limited to Skin Breakdown Periwound Skin Texture Texture Color No Abnormalities Noted: No No Abnormalities Noted: No Callus: No Atrophie Blanche: No Crepitus: No Cyanosis: No Excoriation: No Ecchymosis: No Induration: No Erythema: Yes Rash: No Erythema Location: Circumferential Scarring: No Hemosiderin Staining: No Mottled: No Moisture Pallor: No No Abnormalities Noted: No Rubor: No Dry / Scaly: No Maceration: No Temperature / Pain Temperature: No Abnormality Tenderness on Palpation: Yes Wound Preparation Ulcer Cleansing: Rinsed/Irrigated with Saline Topical Anesthetic Applied: None Treatment Notes Wound #23 (Right, Lateral Amputation Site - Above Knee) 1. Cleansed with: Clean wound with Normal Saline 5. Secondary Dressing Applied Bordered Foam Dressing Dry Gauze Electronic Signature(s) Signed: 11/13/2016 4:03:50 PM By: [name] Entered By: [name] on 11/11/2016 15:52:45 [name]  ([id number]) -------------------------------------------------------------------------------- Wound Assessment Details [name] Date of Service: 11/11/2016 2:45 PM Patient Name: N. Patient Account Number: [id number] Medical Record Treating RN: Phillis Haggis [id number] Number: Other Clinician: 1981-05-10 (36 y.o. Treating [name], Wu Date of Birth/Sex: Female) [name] Goehring/Extender: G Primary Care Kareen Jefferys: [name] Referring Gwendolen Hewlett: [name] in Treatment: 62 Wound Status  Wound Number: 9 Primary Diabetic Wound/Ulcer of the Lower Etiology: Extremity Wound Location: Left Calcaneous Wound Open Wounding Event: Pressure Injury Status: Date Acquired: 07/28/2015 Comorbid Anemia, Type I Diabetes, End Stage Weeks Of Treatment: 62 History: Renal Disease, Rheumatoid Arthritis, Clustered Wound: No Neuropathy Photos Photo Uploaded By: [name] on 11/12/2016 07:47:35 Wound Measurements Length: (cm) 3 Width: (cm) 2 Depth: (cm) 0.1 Area: (cm) 4.712 Volume: (cm) 0.471 % Reduction in Area: 74.6% % Reduction in Volume: 74.6% Epithelialization: None Tunneling: No Undermining: No Wound Description Classification: Grade 2 Wound Margin: Flat and Intact Exudate Amount: Large Exudate Type: Serosanguineous Exudate Color: red, brown Foul Odor After Cleansing: No Slough/Fibrino No Wound Bed Granulation Amount: Medium (34-66%) Exposed Structure [name], [name] ([id number]) Granulation Quality: Red Fascia Exposed: No Necrotic Amount: Medium (34-66%) Fat Layer (Subcutaneous Tissue) Exposed: Yes Necrotic Quality: Eschar, Adherent Slough Tendon Exposed: No Muscle Exposed: No Joint Exposed: No Bone Exposed: No Periwound Skin Texture Texture Color No Abnormalities Noted: No No Abnormalities Noted: No Callus: No Atrophie Blanche: No Crepitus: No Cyanosis: No Excoriation: No Ecchymosis: No Induration: No Erythema: No Rash: No Hemosiderin  Staining: No Scarring: No Mottled: No Pallor: No Moisture Rubor: No No Abnormalities Noted: No Dry / Scaly: No Maceration: No Wound Preparation Ulcer Cleansing: Rinsed/Irrigated with Saline Topical Anesthetic Applied: Other: lidocaine 4%, Treatment Notes Wound #9 (Left Calcaneous) 1. Cleansed with: Clean wound with Normal Saline 2. Anesthetic Topical Lidocaine 4% cream to wound bed prior to debridement 4. Dressing Applied: Iodoflex 5. Secondary Dressing Applied ABD Pad Dry Gauze Kerlix/Conform 7. Secured with Secretary/administrator) Signed: 11/13/2016 4:03:50 PM By: [name] Entered By: [name] on 11/11/2016 16:00:35 [name] ([id number]) -------------------------------------------------------------------------------- Vitals Details [name] Date of Service: 11/11/2016 2:45 PM Patient Name: N. Patient Account Number: [id number] Medical Record Treating RN: Phillis Haggis [id number] Number: Other Clinician: 1981-04-12 (36 y.o. Treating [name], Wu Date of Birth/Sex: Female) Reynol Arnone/Extender: G Primary Care Nikayla Madaris: [name] Referring Lasha Echeverria: [name] in Treatment: 45 Vital Signs Time Taken: 17:23 Reference Range: 80 - 120 mg / dl Height (in): 68 Weight (lbs): 96 Body Mass Index (BMI): 14.6 Notes d/t to pts condition we were in a hurry to get her seen and treated d/t her pain and did not get vitals on accident. Electronic Signature(s) Signed: 11/13/2016 4:03:50 PM By: [name] Entered By: [name] on 11/11/2016 17:23:35

## 2016-11-15 LAB — AEROBIC CULTURE  (SUPERFICIAL SPECIMEN)

## 2016-11-15 LAB — AEROBIC CULTURE W GRAM STAIN (SUPERFICIAL SPECIMEN)

## 2016-11-16 LAB — AEROBIC CULTURE W GRAM STAIN (SUPERFICIAL SPECIMEN)

## 2016-11-17 NOTE — Progress Notes (Signed)
[name], [name] ([id number]) Visit Report for 11/11/2016 Chief Complaint Document Details [name], [name] Date of Service: 11/11/2016 2:45 PM Patient Name: [name] Patient Account Number: [id number] Medical Record Treating RN: Phillis Haggis [id number] Number: Other Clinician: [birth year] (36 y.o. Treating Sani Loiseau Date of Birth/Sex: Female) Provider/Extender: G Primary Care Provider: [name] Referring Provider: [name] in Treatment: 93 Information Obtained from: Patient Chief Complaint [name] returns for evaluation of multiple ulcerations Electronic Signature(s) Signed: 11/11/2016 5:24:26 PM By: Baltazar Najjar MD Entered By: Baltazar Najjar on 11/11/2016 17:11:46 [name] ([id number]) -------------------------------------------------------------------------------- HPI Details [name] Date of Service: 11/11/2016 2:45 PM Patient Name: [name] Patient Account Number: [id number] Medical Record Treating RN: Phillis Haggis [id number] Number: Other Clinician: May 23, 1981 (36 y.o. Treating Lavita Pontius Date of Birth/Sex: Female) Provider/Extender: G Primary Care Provider: [name] Referring Provider: [name] in Treatment: 39 History of Present Illness Location: dry gangrene both feet and heels Quality: Patient reports No Pain. Severity: Patient states wound are getting worse. Duration: Patient has had the wound for > 4months prior to seeking treatment at the wound center Context: The wound appeared gradually over time Modifying Factors: she has been in and out of hospital over the last 2 months Associated Signs and Symptoms: Patient reports having difficulty standing for long periods. HPI Description: [name] is a 36 y.o. female who presents to our wound center, back in June 2016, referred by her PCP Dr. Zada Finders for nonhealing ulcers on the lateral aspect of the right heel. Of note she has a history of  type 1 diabetes mellitus that has been uncontrolled. Past medical history significant for type 1 diabetes mellitus not controlled, ankylosing spondylitis, anorexia nervosa, irritable bowel syndrome, chronic kidney disease, chronic diarrhea. she then developed gangrene of both feet due to severe peripheral vascular disease and also had gangrene of the tips of her fingers due to upper extremity vascular disease. She was being worked up by vascular surgery at [facility] and at [facility] and has had several procedures done there. She started with hyperbaric oxygen therapy and had a total of 40 treatments the last one being on 06/20/2015. After the initial treatment of hyperbaric oxygen therapy she started having ear problems and had ultimately to use myringotomy tubes and this was done bilaterally. Since then her ears have been doing fine. In late September, she had seen vascular and hand surgeons. since then she's been in [place] at the rec center for surgery involving extensive vascular procedures for the upper extremities. She was then at [facility] with GI bleeds both upper and lower and has been in and out of hospital for that. She has recently been out of hospital for the last week. 09/09/2015 -- she was unable to get here in time to start her hyperbaric oxygen today and hence is only here for a wound care visit. 09/19/2015 -- she has been having vancomycin during her dialysis and continues to have vascular appointments and the procedure is been set for early January. She has been unable to make it for her hemodialysis due to various medical symptoms. 09/30/2014 -- her vancomycin was stopped on 09/25/2015 and the mother has noticed the right foot has started draining for the last 3 days. Addendum: after examining the patient I was able to talk to her primary vascular surgeon Dr. Pernell Dupre at the [facility]. I told him about the necrotic area on the plantar aspect of right foot which is now  wet gangrene and he agreed with me  that he would admit her at [facility] under her care and synchronize further treatment. We have discussed her poor prognosis and he and I discussed the need for hospice care and for sitting down and talking to the patient and her mother and giving them a proper detail of the [facility], [name] ([id number]) prognosis. 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 She was worked up with MRIs, abdomen aortogram and placement of left-sided angioplasties were done. After a prolonged hospital she she was discharged home and was told to wear shrinker sock and stump protector and see her surgeon for further instructions regarding wound care and suture removal. Asked to take long-term doxycycline. 01/15/16; this is a patient I haven't seen before although she is been followed by Dr. Meyer Russel in this clinic today. She is a type I diabetic with severe PAD macrovascular disease. She has had a previous BKA. She has dry gangrene of the tips of her fingers which she showed me on the right to. She also has dry gangrene of the left first second and third toes and a portion of her proximal foot around these areas. She is followed by vascular surgery at [facility] and saw them recently they are not going to do surgery as of yet. She has a large black eschar over her heel which is beginning to separate in some areas. As I understand think she is paining these with Betadine. There is been some suggestion about retrying hyperbarics on her although she is still not able to commit to the frequency of  treatment that would be necessary to see improvement. She is also on Monday Wednesday Friday dialysis 01/21/16; the patient returns to see me today with regards to the left heel. Apparently she is not scheduled for any further attempts at revascularization of the left foot. 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 01/28/16; the patient returns today for attention to the left foot specifically the left calcaneal ulcer. This is covered in a thick black eschar. I crosshatched this last week and we have been using Santyl. 02/05/16; I continue to work on the thick black eschar on the patient's left calcaneus. She is using Santyl that this side crosshatched this area and the eschar is beginning to loosen. 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 This is beginning to separate and liquefy as well especially between the first and second toes. In the time being her major complaint is fatigue at dialysis 02/26/16 I continue to work on the patient's left calcaneus thick adherent black eschar. I crosshatched this area and we have been using Santyl although it is very adherent area I remove some nonviable tissue today what I can see of this actually looks surprisingly good. On the same foot she has dry gangrene on the first and second toes and part of the forefoot underneath this. This is beginning to separate. 03/11/16; the patient comes in for her every 2 week appointment.  I have been working on the black eschar on her heel. The patient apparently again has to come off dialysis early today after 2 hours due to severe complaints of nausea. She really does not look well. 04/02/2016 -- the patient has not been seen here for about 3 weeks now and has a new issue with the stump of the right amputation site  and also her right posterior thigh. They have only noticed this for the last couple of days. 04/28/16; I had received a call from the patient's surgeon at [facility]. She had a left transmetatarsal amputation and Integra applied to the left heel. Both of these areas appear to be doing well. There are dressing these with [facility] and they will return to their surgeon on Thursday. She has a new injury on the popliteal fossa on the right which I think was trauma from her stump. 05/20/16; the patient is following with her surgeon at [facility]. She's had a left transmetatarsal debridement of the left heel she had Integra place and apparently is using some consternation of Epson salts soaks, Betadine and Aquacel Ag. The left leg is wrapped I did not look at this today. She has the wound in her right popliteal fossa which is apparently a pressure area possibly related to sitting on a toilet for 2-3 hours multiple times a day. 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 We have been using Santyl to the area on the right popliteal fossa. She has a new wound on her right gluteal area but the patient would not let me look at that today [name], [name] ([id number]) 06/03/16; patient is not doing particularly well. She now has a pressure area over her left ischial tuberosity. This is of quite a size and covered with an adherent surface slough. She looks as though she has lost weight, I didn't want to go ahead and attempt to debridement this today. There is really no evidence of infection. The area behind the right popliteal fossa looks about the same as 2 weeks ago we have been using Santyl to this area. I did not look at her left foot which is wrapped been followed by podiatry at [facility] 06/10/16; patient arrives in clinic actually looking a lot brighter than I usually see her, predictably she did not go to dialysis today. For the first time in  perhaps 6 weeks I actually saw her left foot today. The transmetatarsal site is healed. The left heel has a reasonably stable-looking wound which has a clean base. Some eschar superiorly and a few sutures remain in place. More worrisome is an area on her lateral left foot over the metatarsal head. Quarter size necrotic wound that probes to bone. There is some purulence here which I cultured there is nothing that looks like a healthy base of this area. They've been using silver alginate to this area at home. She also has a large wound in the right popliteal fossa, and again a pressure area over the left ischial tuberosity. We are using Santyl to both of these areas. Both allays looks somewhat better than last week, using Santyl to both areas 06/17/16: culture from last week grew citrobacter and amp sensitive enterococcus fecalis. Started on vanc last Saturday at dialysis. have spoken to dialysis in [place] re adjustment in antibiotics added ceftazidine to vanc 06/24/16; the patient was discharged from [facility] yesterday. She had an IandD of the open area on the lateral aspect  of her foot. She continues on vancomycin and Ceptaz again at dialysis although I'm not exactly sure of the current duration of this. She also had a debridement of the wound over the left greater trochanter done by the wound care team. She is receiving Santyl based dressings to this and the area in her right popliteal fossa. She arrives today completely fatigued from dialysis. I spoke to Dr. Allena Katz her podiatrist and surgeon at [facility] and asked if we could manage the wound VAC which I think we can. He also wanted to ask about hyperbaric oxygen with the indication of chronic osteomyelitis although at this point I am not completely certain where the chronic osteomyelitis is. Finally she apparently has had a re-graft to the area on her left heel which is either a skin graft or Integra 07/15/16; patient arrives today she is not been  in the clinic since I saw her on 9/27. She's been using Santyl to the left greater trochanter, I popliteal fossa. Her home health nurse remove the wound VAC from the transmetatarsal head, there is only a small wound on the medial aspect of the foot. Dr. Allena Katz who is been managing the heel wounds has requested Xeroform to the heel. 07/22/16; patient arrives as usual postdialysis she is very fatigued and weak looking. She generally tolerates dialysis poorly requiring midodrine tosupport her blood pressure. She continues on vancomycin and I believe ceftazadine at dialysis for chronic osteomyelitis oShe has a wound on the lateral left transmetatarsal amputation. This was debrided of surface slough nonviable subcutaneous tissue the base of this looks healthy and improved. oHer left heel wound is followed by by Dr. Allena Katz at [facility] her surgeon. We have been applying Xeroform to this at his request, he'll oShe has a deep wound over her left greater trochanter however this does not appear to be infected has healthy granulation and I think could be well served by a wound VAC with collagen under the foam oUnder the right popliteal fossa there is a pressure area apparently from a toilet seat. This has been making progress again the tissue here appears to be healthy we have been using silver collagen in this area as well oThere is a new small area on the lateral aspect of her right stump which appears to be draining purulent material. Our nurses obtained a specimen of this for culture. As noted she is already onVAncomycin e a third generation cephalosporin 08/05/16 -Ms. Sutherlin arrives today accompanied by her aunt and cousin, she did not receive dialysis today due to "feeling bad" and is scheduled to received dialysis tomorrow. She has completed vancomycin that she was receiving with dialysis. She has an appointment with podiatry on Friday; admits to using right prosthesis for transfers primarily -She has a  wound on the lateral left TMA site, essentially unchanged and stable; staples remain in medial Scaletta, Alvenia [name] ([id number]) aspect of healed surgical site from August, patient states that the plan is to remove staples on Friday -Left heel ulcer appears stable with granulation tissue -stage III left trochanter pressure ulcer with peri-wound irritation, appears to be from leakage despite patient stating that VAC has not leaked, has been using Prisma and wound VAC -Right popliteal fossa pressure ulcer is stable -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 and pale erythema to lateral aspect, improved from last week, although purulence able to be expressed, will encourage warm compresses to aide in bring fluid to surface - new areas of DTI to left foot dorsal and  lateral aspect; patient admits that foot has been wrapped with ace per podiatry, she also wears surgical shoe to left foot; dorsal DTI has intact blister; dorsal DTI not present at last appointment, lateral DTI present but significantly smaller at last appointment; both areas (dorsal>lateral) concerning for threatening limb loss, new to size and location - she has seen "limb loss specialist" at [facility] in the past and was encouraged to follow up with that provider in presence of new development 09/08/16 I have not seen this patient in quite some time perhaps late October. She was seen once here at the beginning of November however her appointments are scheduled after dialysis at which time she doesn't feel well enough to come. Also noted recently our staff of taken several phone calls from her home health nurse that been concerned about worsening wounds on the back of the right knee left greater trochanter. We brought her in today to go over this. As far as I can tell the wounds are as follows; #1 pressure ulcer on the back of her right knee in the popliteal fossa. As best I can tell this is caused by prolonged sitting on the toilet seat  related to ongoing issues with profuse, high-volume diarrhea #2 pressure ulcer over the left greater trochanter. We have been using a wound VAC on this area #3 probing ulcer on the medial aspect of her left transmetatarsal amputation #4 pressure ulcer on the left posterior calcaneus #5 excoriation on the lateral aspect of her left foot which is not clearly open but was concerning last time she was here for a deep tissue injury. She is off antibiotics at dialysis since the last time I saw her. This was for underlying osteomyelitis. 09/16/16; she arrives today with the pressure ulcer on the back of her right knee still draining and looking somewhat worse. Culture I did last week grew Klebsiella and enterococcus which is ampicillin sensitive. I therefore have given her prescription for Augmentin 500/125 once a day post dialysis on dialysis days. The other concerning thing today is the ischemic-looking areas on the lateral left foot and the left anterior foot. She was supposed to see Dr. Pernell Dupre at [facility] today but they did not make it there. 09/30/16; since the patient was last seen here on 12/20 she has been to see Dr. Pernell Dupre at [facility] and apparently he revascularized her left foot although I don't have any of these details. We are concerned last time with ischemic-looking wounds on the left heel left lateral foot. As well she is completed her antibiotics for the drainage coming out of the area on the right popliteal fossa and this looks somewhat better today. Finally she wants to have the VAC discontinued for a period of time stating that she finds it irritating. To be truthful the circumference of the wound over the left buttock seems better but the depth per our intake nurse today is actually worse making it somewhat difficult to argue with her 11/11/16; the patient arrives back in clinic today not looking very well at all. Firstly she is almost immobilized by poorly defined pain including the right leg  greater than the left. Upper arms. She did not complain of back pain. We could not really move her very well in her chair because of pain. She had a recent revascularization of the right BKA leg by Dr. Pernell Dupre at [facility]. I can see his admission note but I can't really see what he did. Also when she was here the last  time I did a culture of the area behind her right popliteal fossa and gave her Augmentin. This was supposed to be after dialysis days for 5 treatments i.e. roughly 10 days however she took them on each day rather than on dialysis days however that would've been 5-6 [name], [name] ([id number]) weeks ago his abdomen that long since we have seen her. She has home health coming out I think they've been using Iodoflex the most of these wound areas. As mentioned she is revascularized on the right. Our intake nurses noted a small draining sinus and they obtained a culture from this area which we sent off this is right on the tip of her amputation site on the right Electronic Signature(s) Signed: 11/11/2016 5:24:26 PM By: Baltazar Najjar MD Entered By: Baltazar Najjar on 11/11/2016 17:14:28 [name] ([id number]) -------------------------------------------------------------------------------- Physical Exam Details [name] Date of Service: 11/11/2016 2:45 PM Patient Name: [name] Patient Account Number: [id number] Medical Record Treating RN: Phillis Haggis [id number] Number: Other Clinician: 1981/02/06 (35 y.o. Treating Ysmael Hires Date of Birth/Sex: Female) Provider/Extender: G Primary Care Provider: [name] Referring Provider: [name] in Treatment: 62 Constitutional Pulse regular and within target range for patient.Marland Kitchen Respirations regular, non-labored and within target range.. The patient looks tired and gaunt. Eyes Conjunctivae clear. No discharge. No jaundice is noted. Respiratory Respiratory effort is easy and symmetric bilaterally. Rate  is normal at rest and on room air.. Cardiovascular Heart rhythm and rate regular, without murmur or gallop.. Gastrointestinal (GI) No major abdominal distention or tenderness. Lymphatic Nonpalpable in the popliteal or inguinal areas. Musculoskeletal She was especially tender in the right thigh area although the cause of this pain really is obscure. Any movement of the right leg seemed to cause a lot of pain. We could not turn her over because of this.Marland Kitchen Psychiatric Patient is lethargic but arousable. Notes 11/11/16; wound exam. The patient is necrosis and dry gangrene of most of her fingertips bilaterally. We have not been following this. oOn the lateral left foot the original infection site is closed over. She has a wound over the left mid foot and the left Achilles area are both covered and eschar. The patient was in too much pain to attempt debridement. oThe over the left greater trochanter I could not even visualize with her in her wheelchair oThe area over her right popliteal fossa has deteriorated quite a bit. There is exposed quadricep tendon. This is a major deterioration from last time artery intake nurse reported a lot of drainage. I have recultured this today. No debridement was attempted. oShe has a small open area on the lateral aspect of her right amputation site. This really did not look too ominous there was no surrounding erythema no major fluid pocket that I could elicit. Electronic Signature(s) Signed: 11/11/2016 5:24:26 PM By: Baltazar Najjar MD [name] ([id number]) Entered By: Baltazar Najjar on 11/11/2016 17:22:01 [name] ([id number]) -------------------------------------------------------------------------------- Physician Orders Details [name] Date of Service: 11/11/2016 2:45 PM Patient Name: [name] Patient Account Number: [id number] Medical Record Treating RN: Phillis Haggis [id number] Number: Other Clinician: Apr 04, 1981 (36  y.o. Treating Briel Gallicchio Date of Birth/Sex: Female) Provider/Extender: G Primary Care Provider: [name] Referring Provider: [name] in Treatment: 75 Verbal / Phone Orders: Yes Clinician: Ashok Cordia, Debi Read Back and Verified: Yes Diagnosis Coding Wound Cleansing 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,Posterior Amputation Site - Below Knee o Clean wound with Normal Saline. o Cleanse wound with mild soap and water Wound #18 Left Ischial  Tuberosity o Clean wound with Normal Saline. o Cleanse wound with mild soap and water Wound #20 Left,Dorsal Foot o Clean wound with Normal Saline. o Cleanse wound with mild soap and water Wound #21 Left,Lateral Foot o Clean wound with Normal Saline. o Cleanse wound with mild soap and water Wound #22 Right Ischial Tuberosity o Clean wound with Normal Saline. o Cleanse wound with mild soap and water Wound #23 Right,Lateral Amputation Site - Above Knee o Clean wound with Normal Saline. o Cleanse wound with mild soap and water Wound #9 Left Calcaneous o Clean wound with Normal Saline. 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,Posterior Amputation Site - Below Knee o Topical Lidocaine 4% cream applied to wound bed prior to debridement Wound #18 Left Ischial Tuberosity o Topical Lidocaine 4% cream applied to wound bed prior to debridement Strout, Chong [name] ([id number]) Wound #20 Left,Dorsal Foot o Topical Lidocaine 4% cream applied to wound bed prior to debridement Wound #21 Left,Lateral Foot o Topical Lidocaine 4% cream applied to wound bed prior to debridement Wound #22 Right Ischial Tuberosity o Topical Lidocaine 4% cream applied to wound bed prior to debridement Wound #9 Left Calcaneous o Topical Lidocaine 4% cream applied to wound bed prior to debridement Primary Wound Dressing 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,Posterior Amputation Site - Below Knee o Hydrogel - or ky jelly o Other:  - cutimed sorbact Wound #18 Left Ischial Tuberosity o Aquacel Ag o Drawtex Wound #20 Left,Dorsal Foot o Iodoflex Wound #21 Left,Lateral Foot o Iodoflex Wound #22 Right Ischial Tuberosity o Boardered Foam Dressing Wound #9 Left Calcaneous o Iodoflex Secondary Dressing 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,Posterior Amputation Site - Below Knee o Gauze, ABD and Kerlix/Conform o Other - tape Wound #18 Left Ischial Tuberosity o Boardered Foam Dressing - on R posterior BKA site wrap lightly with conform Wound #20 Left,Dorsal Foot o Dry Gauze o Gauze, ABD and Kerlix/Conform o Other - tape Wound #21 Left,Lateral Foot o Gauze, ABD and Kerlix/Conform [name], [name] ([id number]) o Other - tape Wound #23 Right,Lateral Amputation Site - Above Knee o Dry Gauze o Boardered Foam Dressing Wound #9 Left Calcaneous o Gauze, ABD and Kerlix/Conform o Other - tape Dressing Change Frequency 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,Posterior Amputation Site - Below Knee o Change dressing every day. Wound #18 Left Ischial Tuberosity o Change dressing every day. Wound #20 Left,Dorsal Foot o Change dressing every day. Wound #21 Left,Lateral Foot o Change dressing every day. Wound #22 Right Ischial Tuberosity o Change dressing every day. Wound #9 Left Calcaneous o Change dressing every day. Follow-up Appointments 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,Posterior Amputation Site - Below Knee o Return Appointment in 2 weeks. o Other: - as patient is able Wound #18 Left Ischial Tuberosity o Return Appointment in 2 weeks. o Other: - as patient is able Wound #20 Left,Dorsal Foot o Return Appointment in 2 weeks. o Other: - as patient is able Wound #21 Left,Lateral Foot o Return Appointment in 2 weeks. o Other: - as patient is able Wound #22 Right Ischial Tuberosity o Return Appointment in 2 weeks. KAIZLEE, CARLINO ([id number]) o Other: - as patient is able Wound #9 Left  Calcaneous o Return Appointment in 2 weeks. 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 Wound #18 Left Ischial Tuberosity o Increase protein intake. Wound #20 Left,Dorsal Foot o Increase protein intake. Wound #21 Left,Lateral Foot o Increase protein intake. Wound #22 Right Ischial Tuberosity   o Increase protein intake. Wound #9 Left Calcaneous o Increase protein intake. Home Health 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,Posterior Amputation Site - Below Knee o Continue Home Health Visits - Please give Milina all the PRN visits that she needs - Also please have PT eval and treat as appropriate o Home Health Nurse may visit PRN to address patientos wound care needs. o FACE TO FACE ENCOUNTER: MEDICARE and MEDICAID PATIENTS: I certify that this patient is under my care and that I had a face-to-face encounter that meets the physician face-to-face encounter requirements with this patient on this date. The encounter with the patient was in whole or in part for the following MEDICAL CONDITION: (primary reason for Home Healthcare) MEDICAL NECESSITY: I certify, that based on my findings, NURSING services are a medically necessary home health service. HOME BOUND STATUS: I certify that my clinical findings support that this patient is homebound (i.e., Due to illness or injury, pt requires aid of supportive devices such as crutches, cane, wheelchairs, walkers, the use of special transportation or the assistance of another person to leave their place of residence. There is a normal inability to leave the home and doing so requires considerable and taxing effort. Other absences are for medical reasons / religious services and are infrequent or of short duration when for other reasons). o If current dressing causes regression in wound condition, may D/C ordered dressing product/s and apply Normal  Saline Moist Dressing daily until next Wound Healing Center / Other MD appointment. Notify Wound Healing Center of regression in wound condition at [phone]. o Please direct any NON-WOUND related issues/requests for orders to patient's Primary Care Physician Wound #18 Left Ischial [name], [name] ([id number]) o Continue Home Health Visits - Please give Eller all the PRN visits that she needs - Also please have PT eval and treat as appropriate o Home Health Nurse may visit PRN to address patientos wound care needs. o FACE TO FACE ENCOUNTER: MEDICARE and MEDICAID PATIENTS: I certify that this patient is under my care and that I had a face-to-face encounter that meets the physician face-to-face encounter requirements with this patient on this date. The encounter with the patient was in whole or in part for the following MEDICAL CONDITION: (primary reason for Home Healthcare) MEDICAL NECESSITY: I certify, that based on my findings, NURSING services are a medically necessary home health service. HOME BOUND STATUS: I certify that my clinical findings support that this patient is homebound (i.e., Due to illness or injury, pt requires aid of supportive devices such as crutches, cane, wheelchairs, walkers, the use of special transportation or the assistance of another person to leave their place of residence. There is a normal inability to leave the home and doing so requires considerable and taxing effort. Other absences are for medical reasons / religious services and are infrequent or of short duration when for other reasons). o If current dressing causes regression in wound condition, may D/C ordered dressing product/s and apply Normal Saline Moist Dressing daily until next Wound Healing Center / Other MD appointment. Notify Wound Healing Center of regression in wound condition at [phone]. o Please direct any NON-WOUND related issues/requests for orders to  patient's Primary Care Physician Wound #20 Left,Dorsal Foot o Continue Home Health Visits - Please give De all the PRN visits that she needs - Also please have PT eval and treat as appropriate o Home Health Nurse may visit PRN to address patientos wound care needs. o FACE TO FACE ENCOUNTER: MEDICARE and MEDICAID  PATIENTS: I certify that this patient is under my care and that I had a face-to-face encounter that meets the physician face-to-face encounter requirements with this patient on this date. The encounter with the patient was in whole or in part for the following MEDICAL CONDITION: (primary reason for Home Healthcare) MEDICAL NECESSITY: I certify, that based on my findings, NURSING services are a medically necessary home health service. HOME BOUND STATUS: I certify that my clinical findings support that this patient is homebound (i.e., Due to illness or injury, pt requires aid of supportive devices such as crutches, cane, wheelchairs, walkers, the use of special transportation or the assistance of another person to leave their place of residence. There is a normal inability to leave the home and doing so requires considerable and taxing effort. Other absences are for medical reasons / religious services and are infrequent or of short duration when for other reasons). o If current dressing causes regression in wound condition, may D/C ordered dressing product/s and apply Normal Saline Moist Dressing daily until next Wound Healing Center / Other MD appointment. Notify Wound Healing Center of regression in wound condition at [phone]. o Please direct any NON-WOUND related issues/requests for orders to patient's Primary Care Physician Wound #21 Left,Lateral Foot o Continue Home Health Visits - Please give Teea all the PRN visits that she needs - Also please have PT eval and treat as appropriate o Home Health Nurse may visit PRN to address patientos wound care  needs. o FACE TO FACE ENCOUNTER: MEDICARE and MEDICAID PATIENTS: I certify that this patient is under my care and that I had a face-to-face encounter that meets the physician face-to-face encounter requirements with this patient on this date. The encounter with the patient was in [place], WILLIS KUIPERS. ([id number]) whole or in part for the following MEDICAL CONDITION: (primary reason for Home Healthcare) MEDICAL NECESSITY: I certify, that based on my findings, NURSING services are a medically necessary home health service. HOME BOUND STATUS: I certify that my clinical findings support that this patient is homebound (i.e., Due to illness or injury, pt requires aid of supportive devices such as crutches, cane, wheelchairs, walkers, the use of special transportation or the assistance of another person to leave their place of residence. There is a normal inability to leave the home and doing so requires considerable and taxing effort. Other absences are for medical reasons / religious services and are infrequent or of short duration when for other reasons). o If current dressing causes regression in wound condition, may D/C ordered dressing product/s and apply Normal Saline Moist Dressing daily until next Wound Healing Center / Other MD appointment. Notify Wound Healing Center of regression in wound condition at [phone]. o Please direct any NON-WOUND related issues/requests for orders to patient's Primary Care Physician Wound #22 Right Ischial Tuberosity o Continue Home Health Visits - Please give Connie all the PRN visits that she needs - Also please have PT eval and treat as appropriate o Home Health Nurse may visit PRN to address patientos wound care needs. o FACE TO FACE ENCOUNTER: MEDICARE and MEDICAID PATIENTS: I certify that this patient is under my care and that I had a face-to-face encounter that meets the physician face-to-face encounter requirements with this patient  on this date. The encounter with the patient was in whole or in part for the following MEDICAL CONDITION: (primary reason for Home Healthcare) MEDICAL NECESSITY: I certify, that based on my findings, NURSING services are a medically necessary home health service.  HOME BOUND STATUS: I certify that my clinical findings support that this patient is homebound (i.e., Due to illness or injury, pt requires aid of supportive devices such as crutches, cane, wheelchairs, walkers, the use of special transportation or the assistance of another person to leave their place of residence. There is a normal inability to leave the home and doing so requires considerable and taxing effort. Other absences are for medical reasons / religious services and are infrequent or of short duration when for other reasons). o If current dressing causes regression in wound condition, may D/C ordered dressing product/s and apply Normal Saline Moist Dressing daily until next Wound Healing Center / Other MD appointment. Notify Wound Healing Center of regression in wound condition at [phone]. o Please direct any NON-WOUND related issues/requests for orders to patient's Primary Care Physician Wound #9 Left Calcaneous o Continue Home Health Visits - Please give Waneda all the PRN visits that she needs - Also please have PT eval and treat as appropriate o Home Health Nurse may visit PRN to address patientos wound care needs. o FACE TO FACE ENCOUNTER: MEDICARE and MEDICAID PATIENTS: I certify that this patient is under my care and that I had a face-to-face encounter that meets the physician face-to-face encounter requirements with this patient on this date. The encounter with the patient was in whole or in part for the following MEDICAL CONDITION: (primary reason for Home Healthcare) MEDICAL NECESSITY: I certify, that based on my findings, NURSING services are a medically necessary home health service. HOME BOUND  STATUS: I certify that my clinical findings support that this patient is homebound (i.e., Due to illness or injury, pt requires aid of supportive devices such as crutches, cane, wheelchairs, walkers, the use of special transportation or the assistance of another person to leave their place of residence. There is a Dioguardi, Richele [name] ([id number]) normal inability to leave the home and doing so requires considerable and taxing effort. Other absences are for medical reasons / religious services and are infrequent or of short duration when for other reasons). o If current dressing causes regression in wound condition, may D/C ordered dressing product/s and apply Normal Saline Moist Dressing daily until next Wound Healing Center / Other MD appointment. Notify Wound Healing Center of regression in wound condition at [phone]. o Please direct any NON-WOUND related issues/requests for orders to patient's Primary Care Physician Laboratory o Bacteria identified in Wound by Culture (MICRO) - x2 oooo LOINC Code: [id number] oooo Convenience Name: Wound culture routine Electronic Signature(s) Signed: 11/13/2016 4:03:50 PM By: [name] Signed: 11/17/2016 7:53:49 AM By: Baltazar Najjar MD Previous Signature: 11/11/2016 5:24:26 PM Version By: Baltazar Najjar MD Entered By: [name] on 11/11/2016 17:32:59 [name] ([id number]) -------------------------------------------------------------------------------- Problem List Details [name] Date of Service: 11/11/2016 2:45 PM Patient Name: [name] Patient Account Number: [id number] Medical Record Treating RN: Phillis Haggis [id number] Number: Other Clinician: [birth year] (36 y.o. Treating Doralyn Kirkes Date of Birth/Sex: Female) Provider/Extender: G Primary Care Provider: [name] Referring Provider: [name] in Treatment: 67 Active Problems ICD-10 Encounter Code Description Active  Date Diagnosis E10.621 Type 1 diabetes mellitus with foot ulcer 08/30/2015 Yes E10.52 Type 1 diabetes mellitus with diabetic peripheral 08/30/2015 Yes angiopathy with gangrene I70.245 Atherosclerosis of native arteries of left leg with ulceration 08/30/2015 Yes of other part of foot I70.261 Atherosclerosis of native arteries of extremities with 08/30/2015 Yes gangrene, right leg L89.622 Pressure ulcer of left heel, stage 2 08/30/2015 Yes Z89.511 Acquired absence of right  leg below knee 11/29/2015 Yes T87.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, right lower extremity 04/02/2016 Yes S71.101A Unspecified open wound, right thigh, initial encounter 04/02/2016 Yes L89.322 Pressure ulcer of left buttock, stage 2 06/10/2016 Yes Wiegand, Castella [name] ([id number]) L89.899 Pressure ulcer of other site, unspecified stage 08/06/2016 Yes L89.223 Pressure ulcer of left hip, stage 3 08/05/2016 Yes Inactive Problems Resolved Problems ICD-10 Code Description Active Date Resolved Date L02.611 Cutaneous abscess of right foot 10/01/2015 10/01/2015 Electronic Signature(s) Signed: 11/11/2016 5:24:26 PM By: Baltazar Najjar MD Entered By: Baltazar Najjar on 11/11/2016 17:11:20 [name] ([id number]) -------------------------------------------------------------------------------- Progress Note Details [name] Date of Service: 11/11/2016 2:45 PM Patient Name: [name] Patient Account Number: [id number] Medical Record Treating RN: Phillis Haggis [id number] Number: Other Clinician: 1981-09-04 (36 y.o. Treating Boyce Keltner Date of Birth/Sex: Female) Provider/Extender: G Primary Care Provider: [name] Referring Provider: [name] in Treatment: 7 Subjective Chief Complaint Information obtained from Patient Ms. Starkovich returns for evaluation of multiple ulcerations History of Present Illness (HPI) The following HPI elements were documented for the patient's wound: Location: dry gangrene both  feet and heels Quality: Patient reports No Pain. Severity: Patient states wound are getting worse. Duration: Patient has had the wound for > 75months prior to seeking treatment at the wound center Context: The wound appeared gradually over time Modifying Factors: she has been in and out of hospital over the last 2 months Associated Signs and Symptoms: Patient reports having difficulty standing for long periods. Pashence Sebold is a 36 y.o. female who presents to our wound center, back in June 2016, referred by her PCP Dr. Zada Finders for nonhealing ulcers on the lateral aspect of the right heel. Of note she has a history of type 1 diabetes mellitus that has been uncontrolled. Past medical history significant for type 1 diabetes mellitus not controlled, ankylosing spondylitis, anorexia nervosa, irritable bowel syndrome, chronic kidney disease, chronic diarrhea. she then developed gangrene of both feet due to severe peripheral vascular disease and also had gangrene of the tips of her fingers due to upper extremity vascular disease. She was being worked up by vascular surgery at [facility] and at [facility] and has had several procedures done there. She started with hyperbaric oxygen therapy and had a total of 40 treatments the last one being on 06/20/2015. After the initial treatment of hyperbaric oxygen therapy she started having ear problems and had ultimately to use myringotomy tubes and this was done bilaterally. Since then her ears have been doing fine. In late September, she had seen vascular and hand surgeons. since then she's been in [place] [facility] center for surgery involving extensive vascular procedures for the upper extremities. She was then at [facility] with GI bleeds both upper and lower and has been in and out of hospital for that. She has recently been out of hospital for the last week. 09/09/2015 -- she was unable to get here in time to start her hyperbaric oxygen today and hence is  only here for a wound care visit. 09/19/2015 -- she has been having vancomycin during her dialysis and continues to have vascular appointments and the procedure is been set for early January. She has been unable to make it for her hemodialysis due to various medical symptoms. CHINELO, HEMRIC ([id number]) 09/30/2014 -- her vancomycin was stopped on 09/25/2015 and the mother has noticed the right foot has started draining for the last 3 days. Addendum: after examining the patient I was able to talk to her primary vascular  surgeon Dr. Pernell Dupre at the [facility]. I told him about the necrotic area on the plantar aspect of right foot which is now wet gangrene and he agreed with me that he would admit her at [facility] under her care and synchronize further treatment. We have discussed her poor prognosis and he and I discussed the need for hospice care and for sitting down and talking to the patient and her mother and giving them a proper detail of the prognosis. 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 She was worked up with MRIs, abdomen aortogram and placement of left-sided angioplasties were done. After a prolonged hospital she she was discharged home and was told to wear shrinker sock and stump protector and see her surgeon for further instructions regarding wound care and suture removal. Asked to take long-term doxycycline. 01/15/16; this is a patient I haven't seen before although she is been followed by Dr. Meyer Russel in this clinic today. She is a type I diabetic with severe PAD macrovascular  disease. She has had a previous BKA. She has dry gangrene of the tips of her fingers which she showed me on the right to. She also has dry gangrene of the left first second and third toes and a portion of her proximal foot around these areas. She is followed by vascular surgery at [facility] and saw them recently they are not going to do surgery as of yet. She has a large black eschar over her heel which is beginning to separate in some areas. As I understand think she is paining these with Betadine. There is been some suggestion about retrying hyperbarics on her although she is still not able to commit to the frequency of treatment that would be necessary to see improvement. She is also on Monday Wednesday Friday dialysis 01/21/16; the patient returns to see me today with regards to the left heel. Apparently she is not scheduled for any further attempts at revascularization of the left foot. 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 01/28/16; the patient returns today for attention to the left foot specifically the left calcaneal ulcer. This is covered in a thick black eschar. I crosshatched this last week and we have been using Santyl. 02/05/16; I continue to work on the thick black eschar on the patient's left calcaneus. She is using Santyl that this side crosshatched this area and the eschar is beginning to loosen. 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 This is beginning to separate and liquefy as well especially between the first and second toes. In the time being her major complaint is fatigue at dialysis 02/26/16 I continue to work on the patient's left calcaneus thick adherent black eschar. I crosshatched this area and we have been using Santyl although it is very adherent area I remove  some nonviable tissue today what I can see of this actually looks surprisingly good. On the same foot she has dry gangrene on  the first and second toes and part of the forefoot underneath this. This is beginning to separate. 03/11/16; the patient comes in for her every 2 week appointment. I have been working on the black eschar on her heel. The patient apparently again has to come off dialysis early today after 2 hours due to severe complaints of nausea. She really does not look well. 04/02/2016 -- the patient has not been seen here for about 3 weeks now and has a new issue with the stump of the right amputation site and also her right posterior thigh. They have only noticed this for the last couple of days. 04/28/16; I had received a call from the patient's surgeon at [facility]. She had a left transmetatarsal amputation and Integra applied to the left heel. Both of these areas appear to be doing well. There are dressing these with [facility] and they will return to their surgeon on Thursday. She has a new injury on the popliteal [name], [name] ([id number]) fossa on the right which I think was trauma from her stump. 05/20/16; the patient is following with her surgeon at [facility]. She's had a left transmetatarsal debridement of the left heel she had Integra place and apparently is using some consternation of Epson salts soaks, Betadine and Aquacel Ag. The left leg is wrapped I did not look at this today. She has the wound in her right popliteal fossa which is apparently a pressure area possibly related to sitting on a toilet for 2-3 hours multiple times a day. 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 We have been using Santyl to the area on the right popliteal fossa. She has a new wound on her right gluteal area but the patient would not let me look at that today 06/03/16; patient is not doing particularly well. She now has a  pressure area over her left ischial tuberosity. This is of quite a size and covered with an adherent surface slough. She looks as though she has lost weight, I didn't want to go ahead and attempt to debridement this today. There is really no evidence of infection. The area behind the right popliteal fossa looks about the same as 2 weeks ago we have been using Santyl to this area. I did not look at her left foot which is wrapped been followed by podiatry at [facility] 06/10/16; patient arrives in clinic actually looking a lot brighter than I usually see her, predictably she did not go to dialysis today. For the first time in perhaps 6 weeks I actually saw her left foot today. The transmetatarsal site is healed. The left heel has a reasonably stable-looking wound which has a clean base. Some eschar superiorly and a few sutures remain in place. More worrisome is an area on her lateral left foot over the metatarsal head. Quarter size necrotic wound that probes to bone. There is some purulence here which I cultured there is nothing that looks like a healthy base of this area. They've been using silver alginate to this area at home. She also has a large wound in the right popliteal fossa, and again a pressure area over the left ischial tuberosity. We are using Santyl to both of these areas. Both allays looks somewhat better than last week, using Santyl to both areas 06/17/16: culture from last week grew citrobacter and amp sensitive enterococcus fecalis. Started on vanc last Saturday at dialysis. have spoken to dialysis in [place] re adjustment  in antibiotics added ceftazidine to vanc 06/24/16; the patient was discharged from [facility] yesterday. She had an IandD of the open area on the lateral aspect of her foot. She continues on vancomycin and Ceptaz again at dialysis although I'm not exactly sure of the current duration of this. She also had a debridement of the wound over the left greater trochanter done  by the wound care team. She is receiving Santyl based dressings to this and the area in her right popliteal fossa. She arrives today completely fatigued from dialysis. I spoke to Dr. Allena Katz her podiatrist and surgeon at [facility] and asked if we could manage the wound VAC which I think we can. He also wanted to ask about hyperbaric oxygen with the indication of chronic osteomyelitis although at this point I am not completely certain where the chronic osteomyelitis is. Finally she apparently has had a re-graft to the area on her left heel which is either a skin graft or Integra 07/15/16; patient arrives today she is not been in the clinic since I saw her on 9/27. She's been using Santyl to the left greater trochanter, I popliteal fossa. Her home health nurse remove the wound VAC from the transmetatarsal head, there is only a small wound on the medial aspect of the foot. Dr. Allena Katz who is been managing the heel wounds has requested Xeroform to the heel. 07/22/16; patient arrives as usual postdialysis she is very fatigued and weak looking. She generally tolerates dialysis poorly requiring midodrine tosupport her blood pressure. 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 This was debrided of surface slough nonviable subcutaneous tissue the base of this looks healthy and improved. Her left heel wound is followed by by Dr. Allena Katz at [facility] her surgeon. We have been applying Xeroform to this at his request, he'll She has a deep wound over her left greater trochanter however this does not appear to be infected has healthy granulation and I think could be well served by a wound VAC with collagen under the foam Under the right popliteal fossa there is a pressure area apparently from a toilet seat. This has been making progress again the tissue here appears to be healthy we have been using silver collagen in  this area as well There is a new small area on the lateral aspect of her right stump which appears to be draining purulent [name], [name] ([id number]) material. Our nurses obtained a specimen of this for culture. As noted she is already onVAncomycin e a third generation cephalosporin 08/05/16 -Ms. Sawdy arrives today accompanied by her aunt and cousin, she did not receive dialysis today due to "feeling bad" and is scheduled to received dialysis tomorrow. She has completed vancomycin that she was receiving with dialysis. She has an appointment with podiatry on Friday; admits to using right prosthesis for transfers primarily -She has a wound on the lateral left TMA site, essentially unchanged and stable; staples remain in medial aspect of healed surgical site from August, patient states that the plan is to remove staples on Friday -Left heel ulcer appears stable with granulation tissue -stage III left trochanter pressure ulcer with peri-wound irritation, appears to be from leakage despite patient stating that VAC has not leaked, has been using Prisma and wound VAC -Right popliteal fossa pressure ulcer is stable -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 and pale erythema to lateral aspect, improved from last week,  although purulence able to be expressed, will encourage warm compresses to aide in bring fluid to surface - new areas of DTI to left foot dorsal and lateral aspect; patient admits that foot has been wrapped with ace per podiatry, she also wears surgical shoe to left foot; dorsal DTI has intact blister; dorsal DTI not present at last appointment, lateral DTI present but significantly smaller at last appointment; both areas (dorsal>lateral) concerning for threatening limb loss, new to size and location - she has seen "limb loss specialist" at [facility] in the past and was encouraged to follow up with that provider in presence of new development 09/08/16 I have not seen this patient in  quite some time perhaps late October. She was seen once here at the beginning of November however her appointments are scheduled after dialysis at which time she doesn't feel well enough to come. Also noted recently our staff of taken several phone calls from her home health nurse that been concerned about worsening wounds on the back of the right knee left greater trochanter. We brought her in today to go over this. As far as I can tell the wounds are as follows; #1 pressure ulcer on the back of her right knee in the popliteal fossa. As best I can tell this is caused by prolonged sitting on the toilet seat related to ongoing issues with profuse, high-volume diarrhea #2 pressure ulcer over the left greater trochanter. We have been using a wound VAC on this area #3 probing ulcer on the medial aspect of her left transmetatarsal amputation #4 pressure ulcer on the left posterior calcaneus #5 excoriation on the lateral aspect of her left foot which is not clearly open but was concerning last time she was here for a deep tissue injury. She is off antibiotics at dialysis since the last time I saw her. This was for underlying osteomyelitis. 09/16/16; she arrives today with the pressure ulcer on the back of her right knee still draining and looking somewhat worse. Culture I did last week grew Klebsiella and enterococcus which is ampicillin sensitive. I therefore have given her prescription for Augmentin 500/125 once a day post dialysis on dialysis days. The other concerning thing today is the ischemic-looking areas on the lateral left foot and the left anterior foot. She was supposed to see Dr. Pernell Dupre at [facility] today but they did not make it there. 09/30/16; since the patient was last seen here on 12/20 she has been to see Dr. Pernell Dupre at [facility] and apparently he revascularized her left foot although I don't have any of these details. We are concerned last time with ischemic-looking wounds on the left heel left  lateral foot. As well she is completed her antibiotics for the drainage coming out of the area on the right popliteal fossa and this looks somewhat better today. Finally she wants to have the VAC discontinued for a period of time stating that she finds it irritating. To be truthful the circumference of the wound over the left buttock seems better but the depth per our intake nurse today is actually worse making it somewhat difficult to argue with her [name], [name] ([id number]) 11/11/16; the patient arrives back in clinic today not looking very well at all. Firstly she is almost immobilized by poorly defined pain including the right leg greater than the left. Upper arms. She did not complain of back pain. We could not really move her very well in her chair because of pain. She had a recent revascularization  of the right BKA leg by Dr. Pernell Dupre at [facility]. I can see his admission note but I can't really see what he did. Also when she was here the last time I did a culture of the area behind her right popliteal fossa and gave her Augmentin. This was supposed to be after dialysis days for 5 treatments i.e. roughly 10 days however she took them on each day rather than on dialysis days however that would've been 5-6 weeks ago his abdomen that long since we have seen her. She has home health coming out I think they've been using Iodoflex the most of these wound areas. As mentioned she is revascularized on the right. Our intake nurses noted a small draining sinus and they obtained a culture from this area which we sent off this is right on the tip of her amputation site on the right Objective Constitutional Pulse regular and within target range for patient.Marland Kitchen Respirations regular, non-labored and within target range.. The patient looks tired and gaunt. Vitals Time Taken: 5:23 PM, Height: 68 in, Weight: 96 lbs, BMI: 14.6. General Notes: d/t to pts condition we were in a hurry to get her seen and treated  d/t her pain and did not get vitals on accident. Eyes Conjunctivae clear. No discharge. No jaundice is noted. Respiratory Respiratory effort is easy and symmetric bilaterally. Rate is normal at rest and on room air.. Cardiovascular Heart rhythm and rate regular, without murmur or gallop.. Gastrointestinal (GI) No major abdominal distention or tenderness. Lymphatic Nonpalpable in the popliteal or inguinal areas. Musculoskeletal She was especially tender in the right thigh area although the cause of this pain really is obscure. Any movement of the right leg seemed to cause a lot of pain. We could not turn her over because of this.Marland Kitchen Psychiatric [name], [name] ([id number]) Patient is lethargic but arousable. General Notes: 11/11/16; wound exam. The patient is necrosis and dry gangrene of most of her fingertips bilaterally. We have not been following this. On the lateral left foot the original infection site is closed over. She has a wound over the left mid foot and the left Achilles area are both covered and eschar. The patient was in too much pain to attempt debridement. The over the left greater trochanter I could not even visualize with her in her wheelchair The area over her right popliteal fossa has deteriorated quite a bit. There is exposed quadricep tendon. This is a major deterioration from last time artery intake nurse reported a lot of drainage. I have recultured this today. No debridement was attempted. She has a small open area on the lateral aspect of her right amputation site. This really did not look too ominous there was no surrounding erythema no major fluid pocket that I could elicit. Integumentary (Hair, Skin) Wound #16 status is Open. Original cause of wound was Pressure Injury. 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 The wound measures 5cm length x 4.5cm width x 0.7cm depth; 17.671cm^2 area and 12.37cm^3 volume. There is tendon and  Fat Layer (Subcutaneous Tissue) Exposed exposed. There is no tunneling or undermining noted. There is a large amount of purulent drainage noted. The wound margin is distinct with the outline attached to the wound base. There is medium (34-66%) pink granulation within the wound bed. There is a medium (34-66%) amount of necrotic tissue within the wound bed including Eschar and Adherent Slough. The periwound skin appearance exhibited: Induration, Erythema. The periwound skin appearance did not exhibit:  Callus, Crepitus, Excoriation, Rash, Scarring, Dry/Scaly, Maceration, Atrophie Blanche, Cyanosis, Ecchymosis, Hemosiderin Staining, Mottled, Pallor, Rubor. The surrounding wound skin color is noted with erythema which is circumferential. Periwound temperature was noted as No Abnormality. The periwound has tenderness on palpation. Wound #17 status is Healed - Epithelialized. Original cause of wound was Surgical Injury. The wound is located on the Left Amputation Site - Transmetatarsal. The wound measures 0cm length x 0cm width x 0cm depth; 0cm^2 area and 0cm^3 volume. The wound is limited to skin breakdown. There is no tunneling or undermining noted. There is a none present amount of drainage noted. The wound margin is flat and intact. There is no granulation within the wound bed. There is no necrotic tissue within the wound bed. The periwound skin appearance exhibited: Ecchymosis. The periwound skin appearance did not exhibit: Callus, Crepitus, Excoriation, Induration, Rash, Scarring, Dry/Scaly, Maceration, Atrophie Blanche, Cyanosis, Hemosiderin Staining, Mottled, Pallor, Rubor, Erythema. Wound #20 status is Open. Original cause of wound was Pressure Injury. The wound is located on the Left,Dorsal Foot. The wound measures 3.4cm length x 4cm width x 0.1cm depth; 10.681cm^2 area and 1.068cm^3 volume. The wound is limited to skin breakdown. There is no tunneling or undermining noted. There is a none  present amount of drainage noted. The wound margin is flat and intact. There is no granulation within the wound bed. There is a large (67-100%) amount of necrotic tissue within the wound bed including Eschar and Adherent Slough. The periwound skin appearance did not exhibit: Callus, Crepitus, Excoriation, Induration, Rash, Scarring, Dry/Scaly, Maceration, Atrophie Blanche, Cyanosis, Ecchymosis, Hemosiderin Staining, Mottled, Pallor, Rubor, Erythema. Wound #21 status is Open. Original cause of wound was Pressure Injury. The wound is located on the Left,Lateral Foot. The wound measures 1.1cm length x 1.4cm width x 0.1cm depth; 1.21cm^2 area and 0.121cm^3 volume. The wound is limited to skin breakdown. There is no tunneling or undermining noted. There is a small amount of serous drainage noted. The wound margin is flat and intact. There is no granulation within the wound bed. There is a large (67-100%) amount of necrotic tissue within the wound bed including Eschar and Adherent Slough. The periwound skin appearance exhibited: Induration, Ecchymosis. The periwound skin appearance did not exhibit: Callus, Crepitus, Excoriation, Rash, Scarring, Redel, [name] ([id number]) Dry/Scaly, Maceration, Atrophie Blanche, Cyanosis, Hemosiderin Staining, Mottled, Pallor, Rubor, Erythema. Wound #23 status is Open. Original cause of wound was Pimple. The wound is located on the Right,Lateral Amputation Site - Above Knee. The wound measures 0.2cm length x 0.2cm width x 0.4cm depth; 0.031cm^2 area and 0.013cm^3 volume. The wound is limited to skin breakdown. There is no tunneling or undermining noted. There is a large amount of purulent drainage noted. The wound margin is flat and intact. There is no granulation within the wound bed. There is no necrotic tissue within the wound bed. The periwound skin appearance exhibited: Erythema. The periwound skin appearance did not exhibit: Callus, Crepitus, Excoriation,  Induration, Rash, Scarring, Dry/Scaly, Maceration, Atrophie Blanche, Cyanosis, Ecchymosis, Hemosiderin Staining, Mottled, Pallor, Rubor. The surrounding wound skin color is noted with erythema which is circumferential. Periwound temperature was noted as No Abnormality. The periwound has tenderness on palpation. Wound #9 status is Open. Original cause of wound was Pressure Injury. The wound is located on the Left Calcaneous. The wound measures 3cm length x 2cm width x 0.1cm depth; 4.712cm^2 area and 0.471cm^3 volume. There is Fat Layer (Subcutaneous Tissue) Exposed exposed. There is no tunneling or undermining noted. There is a large  amount of serosanguineous drainage noted. The wound margin is flat and intact. There is medium (34-66%) red granulation within the wound bed. There is a medium (34-66%) amount of necrotic tissue within the wound bed including Eschar and Adherent Slough. The periwound skin appearance did not exhibit: Callus, Crepitus, Excoriation, Induration, Rash, Scarring, Dry/Scaly, Maceration, Atrophie Blanche, Cyanosis, Ecchymosis, Hemosiderin Staining, Mottled, Pallor, Rubor, Erythema. 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 ([id number]) Wound Cleansing: Wound #16  Right,Posterior Amputation Site - Below Knee: Clean wound with Normal Saline. Cleanse wound with mild soap and water Wound #18 Left Ischial Tuberosity: Clean wound with Normal Saline. Cleanse wound with mild soap and water Wound #20 Left,Dorsal Foot: Clean wound with Normal Saline. Cleanse wound with mild soap and water Wound #21 Left,Lateral Foot: Clean wound with Normal Saline. Cleanse wound with mild soap and water Wound #22 Right Ischial Tuberosity: Clean wound with Normal Saline. Cleanse wound with mild soap and water Wound #23 Right,Lateral Amputation Site - Above Knee: Clean wound with Normal Saline. Cleanse wound with mild soap and water Wound #9 Left Calcaneous: Clean wound with Normal Saline. 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,Posterior Amputation Site - Below Knee: Topical Lidocaine 4% cream applied to wound bed prior to debridement Wound #18 Left Ischial Tuberosity: Topical Lidocaine 4% cream applied to wound bed prior to debridement Wound #20 Left,Dorsal Foot: Topical Lidocaine 4% cream applied to wound bed prior to debridement Wound #21 Left,Lateral Foot: Topical Lidocaine 4% cream applied to wound bed prior to debridement Wound #22 Right Ischial Tuberosity: Topical Lidocaine 4% cream applied to wound bed prior to debridement Wound #9 Left Calcaneous: Topical Lidocaine 4% cream applied to wound bed prior to debridement Primary Wound Dressing: 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,Posterior Amputation Site - Below Knee: Hydrogel - or ky jelly Other: - cutimed sorbact Wound #18 Left Ischial Tuberosity: Aquacel Ag Drawtex Wound #20 Left,Dorsal Foot: Iodoflex Wound #21 Left,Lateral Foot: Iodoflex Wound #22 Right Ischial Tuberosity: Boardered Foam Dressing Wound #9 Left Calcaneous: TERRILYNN, POSTELL ([id number]) Secondary Dressing: 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,Posterior Amputation Site - Below Knee: Gauze, ABD and Kerlix/Conform Other -  tape Wound #18 Left Ischial Tuberosity: Boardered Foam Dressing - on R posterior BKA site wrap lightly with conform Wound #20 Left,Dorsal Foot: Dry Gauze Gauze, ABD and Kerlix/Conform Other - tape Wound #21 Left,Lateral Foot: Gauze, ABD and Kerlix/Conform Other - tape Wound #23 Right,Lateral Amputation Site - Above Knee: Dry Gauze Boardered Foam Dressing Wound #9 Left Calcaneous: Gauze, ABD and Kerlix/Conform Other - tape Dressing Change Frequency: 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 Wound #18 Left Ischial Tuberosity: Change dressing every day. Wound #20 Left,Dorsal Foot: Change dressing every day. Wound #21 Left,Lateral Foot: Change dressing every day. Wound #22 Right Ischial Tuberosity: Change dressing every day. Wound #9 Left Calcaneous:  Change dressing every day. 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 Other: - as patient is able Wound #18 Left Ischial Tuberosity: Return Appointment in 2 weeks. Other: - as patient is able Wound #20 Left,Dorsal Foot: Return Appointment in 2 weeks. Other: - as patient is able Wound #21 Left,Lateral Foot: Return Appointment in 2 weeks. Other: - as patient is able Wound #22 Right Ischial Tuberosity: Return Appointment in 2 weeks. Other: - as patient is able Wound #9 Left Calcaneous: Return Appointment in 2 weeks. Other: - as patient is able DESIREY, KEAHEY ([id number]) Additional Orders / Instructions: 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,Posterior Amputation Site - Below Knee: Increase protein intake. Wound #18 Left Ischial Tuberosity: Increase protein intake. Wound #20 Left,Dorsal Foot: Increase protein intake. Wound #21 Left,Lateral Foot: Increase protein intake. Wound #22 Right Ischial Tuberosity: Increase protein intake. Wound #9 Left Calcaneous: Increase protein intake. Home Health: 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,Posterior  Amputation Site - Below Knee: Continue Home Health Visits - Please give Devory all the PRN visits that she needs - Also please have PT eval and treat as appropriate Home Health Nurse may visit PRN to address patient s wound care needs. FACE TO FACE ENCOUNTER: MEDICARE and MEDICAID PATIENTS: I certify that this patient is under my care and that I had a face-to-face encounter that meets the physician face-to-face encounter requirements with this patient on this date. The encounter with the patient was in whole or in part for the following MEDICAL CONDITION: (primary reason for Home Healthcare) MEDICAL NECESSITY: I certify, that based on my findings, NURSING services are a medically necessary home health service. HOME BOUND STATUS: I certify that my clinical findings support that this patient is homebound (i.e., Due to illness or injury, pt requires aid of supportive devices such as crutches, cane, wheelchairs, walkers, the use of special transportation or the assistance of another person to leave their place of residence. There is a normal inability to leave the home and doing so requires considerable and taxing effort. Other absences are for medical reasons / religious services and are infrequent or of short duration when for other reasons). If current dressing causes regression in wound condition, may D/C ordered dressing product/s and apply Normal Saline Moist Dressing daily until next Wound Healing Center / Other MD appointment. Notify Wound Healing Center of regression in wound condition at [phone]. Please direct any NON-WOUND related issues/requests for orders to patient's Primary Care Physician Wound #18 Left Ischial Tuberosity: Continue Home Health Visits - Please give Evolet all the PRN visits that she needs - Also please have PT eval and treat as appropriate Home Health Nurse may visit PRN to address patient s wound care needs. FACE TO FACE ENCOUNTER: MEDICARE and MEDICAID  PATIENTS: I certify that this patient is under my care and that I had a face-to-face encounter that meets the physician face-to-face encounter requirements with this patient on this date. The encounter with the patient was in whole or in part for the following MEDICAL CONDITION: (primary reason for Home Healthcare) MEDICAL NECESSITY: I certify, that based on my findings, NURSING services are a medically necessary home health service. HOME BOUND STATUS: I certify that my clinical findings support that this patient is homebound (i.e., Due to illness or injury, pt requires aid of supportive devices such as crutches, cane, wheelchairs, walkers, the use of special transportation or the assistance of another person to leave their place of residence. There is a normal inability to leave  the home and doing so requires considerable and taxing effort. Other absences are for medical reasons / religious services and are infrequent or of short duration when for other reasons). If current dressing causes regression in wound condition, may D/C ordered dressing product/s and apply Normal Saline Moist Dressing daily until next Wound Healing Center / Other MD appointment. Notify Wound Healing Center of regression in wound condition at [phone]. Please direct any NON-WOUND related issues/requests for orders to patient's Primary Care Physician Wound #20 Left,Dorsal Foot: ALIAH, ERIKSSON ([id number]) Continue Home Health Visits - Please give Margarine all the PRN visits that she needs - Also please have PT eval and treat as appropriate Home Health Nurse may visit PRN to address patient s wound care needs. FACE TO FACE ENCOUNTER: MEDICARE and MEDICAID PATIENTS: I certify that this patient is under my care and that I had a face-to-face encounter that meets the physician face-to-face encounter requirements with this patient on this date. The encounter with the patient was in whole or in part for the following  MEDICAL CONDITION: (primary reason for Home Healthcare) MEDICAL NECESSITY: I certify, that based on my findings, NURSING services are a medically necessary home health service. HOME BOUND STATUS: I certify that my clinical findings support that this patient is homebound (i.e., Due to illness or injury, pt requires aid of supportive devices such as crutches, cane, wheelchairs, walkers, the use of special transportation or the assistance of another person to leave their place of residence. There is a normal inability to leave the home and doing so requires considerable and taxing effort. Other absences are for medical reasons / religious services and are infrequent or of short duration when for other reasons). If current dressing causes regression in wound condition, may D/C ordered dressing product/s and apply Normal Saline Moist Dressing daily until next Wound Healing Center / Other MD appointment. Notify Wound Healing Center of regression in wound condition at [phone]. Please direct any NON-WOUND related issues/requests for orders to patient's Primary Care Physician Wound #21 Left,Lateral Foot: Continue Home Health Visits - Please give Jenne all the PRN visits that she needs - Also please have PT eval and treat as appropriate Home Health Nurse may visit PRN to address patient s wound care needs. FACE TO FACE ENCOUNTER: MEDICARE and MEDICAID PATIENTS: I certify that this patient is under my care and that I had a face-to-face encounter that meets the physician face-to-face encounter requirements with this patient on this date. The encounter with the patient was in whole or in part for the following MEDICAL CONDITION: (primary reason for Home Healthcare) MEDICAL NECESSITY: I certify, that based on my findings, NURSING services are a medically necessary home health service. HOME BOUND STATUS: I certify that my clinical findings support that this patient is homebound (i.e., Due to illness or  injury, pt requires aid of supportive devices such as crutches, cane, wheelchairs, walkers, the use of special transportation or the assistance of another person to leave their place of residence. There is a normal inability to leave the home and doing so requires considerable and taxing effort. Other absences are for medical reasons / religious services and are infrequent or of short duration when for other reasons). If current dressing causes regression in wound condition, may D/C ordered dressing product/s and apply Normal Saline Moist Dressing daily until next Wound Healing Center / Other MD appointment. Notify Wound Healing Center of regression in wound condition at [phone]. Please direct any NON-WOUND related issues/requests for orders  to patient's Primary Care Physician Wound #22 Right Ischial Tuberosity: Continue Home Health Visits - Please give Avina all the PRN visits that she needs - Also please have PT eval and treat as appropriate Home Health Nurse may visit PRN to address patient s wound care needs. FACE TO FACE ENCOUNTER: MEDICARE and MEDICAID PATIENTS: I certify that this patient is under my care and that I had a face-to-face encounter that meets the physician face-to-face encounter requirements with this patient on this date. The encounter with the patient was in whole or in part for the following MEDICAL CONDITION: (primary reason for Home Healthcare) MEDICAL NECESSITY: I certify, that based on my findings, NURSING services are a medically necessary home health service. HOME BOUND STATUS: I certify that my clinical findings support that this patient is homebound (i.e., Due to illness or injury, pt requires aid of supportive devices such as crutches, cane, wheelchairs, walkers, the use of special transportation or the assistance of another person to leave their place of residence. There is a normal inability to leave the home and doing so requires considerable and taxing  effort. Other absences are for medical reasons / religious services and are infrequent or of short duration when for other reasons). If current dressing causes regression in wound condition, may D/C ordered dressing product/s and apply Normal Saline Moist Dressing daily until next Wound Healing Center / Other MD appointment. Notify Wound [place] Tarica [name] ([id number]) Healing Center of regression in wound condition at [phone]. Please direct any NON-WOUND related issues/requests for orders to patient's Primary Care Physician Wound #9 Left Calcaneous: Continue Home Health Visits - Please give Chelsi all the PRN visits that she needs - Also please have PT eval and treat as appropriate Home Health Nurse may visit PRN to address patient s wound care needs. FACE TO FACE ENCOUNTER: MEDICARE and MEDICAID PATIENTS: I certify that this patient is under my care and that I had a face-to-face encounter that meets the physician face-to-face encounter requirements with this patient on this date. The encounter with the patient was in whole or in part for the following MEDICAL CONDITION: (primary reason for Home Healthcare) MEDICAL NECESSITY: I certify, that based on my findings, NURSING services are a medically necessary home health service. HOME BOUND STATUS: I certify that my clinical findings support that this patient is homebound (i.e., Due to illness or injury, pt requires aid of supportive devices such as crutches, cane, wheelchairs, walkers, the use of special transportation or the assistance of another person to leave their place of residence. There is a normal inability to leave the home and doing so requires considerable and taxing effort. Other absences are for medical reasons / religious services and are infrequent or of short duration when for other reasons). If current dressing causes regression in wound condition, may D/C ordered dressing product/s and apply Normal Saline Moist Dressing  daily until next Wound Healing Center / Other MD appointment. Notify Wound Healing Center of regression in wound condition at [phone]. Please direct any NON-WOUND related issues/requests for orders to patient's Primary Care Physician Laboratory ordered were: Wound culture routine - x2 #1 I do not know what to make of this patient's diffuse complaints of pain I've asked him to speak to the dialysis doctors. I would wonder about PTH/osteomalacia however usually this is Well under control in dialysis patients #2 the major wound that is deteriorated today is in the right popliteal fossa I cultured this and changed her dressing to Aquacel Ag #3  the areas on her foot can continue to be just with Iodoflex #4 the area on her left greater trochanter I could not even get out today I did not examine it. #5 her home health nurses have suggested daily dressing changes which if they can manage his graded managed. #6 constitutionally this patient does not look well. She looks tired chronically ill and gaunt although I don't know that any of her wounds per se are directly responsible for this #7 I'd like to have another look at her in 2 weeks. We'll have the culture result and we may need IV antibiotics for the popliteal fossa this is deteriorated quite a bit Ayoub, Larna [name] ([id number]) #8 she was revascularized in the right leg by Dr. Pernell Dupre and the leg other is been the popliteal fossa wound looks quite satisfactory I really can't determine why she is in so much discomfort. Nothing looks infected or ischemic Electronic Signature(s) Signed: 11/16/2016 12:31:22 PM By: Elliot Gurney RN, [name], Kim RN, [name] Signed: 11/17/2016 7:53:49 AM By: Baltazar Najjar MD Previous Signature: 11/11/2016 5:22:45 PM Version By: Baltazar Najjar MD Entered By: Elliot Gurney RN, [name], Kim on 11/16/2016 12:31:22 LEBA, TIBBITTS ([id number]) -------------------------------------------------------------------------------- SuperBill  Details [name] Date of Service: 11/11/2016 Patient Name: [name] Patient Account Number: [id number] Medical Record Treating RN: Phillis Haggis [id number] Number: Other Clinician: [birth year] (36 y.o. 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, right lower extremity S71.101A Unspecified open wound, right thigh, initial encounter L89.322 Pressure ulcer of left buttock, stage 2 L89.899 Pressure ulcer of other site, unspecified stage L89.223 Pressure ulcer of left hip, stage 3 Facility Procedures CPT4 Code: 08657846 Description: 96295 - WOUND CARE VISIT-LEV 5 EST PT Modifier: Quantity: 1 Physician Procedures CPT4 Code: 2841324 Description: 99214 - WC PHYS LEVEL 4 - EST PT ICD-10 Description Diagnosis E10.621 Type 1 diabetes mellitus with foot ulcer L89.899 Pressure ulcer of other site, unspecified stage L89.223 Pressure ulcer of left hip, stage 3 Modifier: Quantity: 1 Electronic Signature(s) Signed: 11/13/2016 4:03:50 PM By: [name] Signed: 11/17/2016 7:53:49 AM By: Baltazar Najjar MD Previous Signature: 11/11/2016 5:24:26 PM Version By: Baltazar Najjar MD [name] ([id number]) Entered By: [name] on 11/11/2016 17:47:32

## 2016-11-18 ENCOUNTER — Encounter: Payer: Medicare Other | Admitting: Internal Medicine

## 2016-11-18 DIAGNOSIS — E10621 Type 1 diabetes mellitus with foot ulcer: Secondary | ICD-10-CM | POA: Diagnosis not present

## 2016-11-19 NOTE — Progress Notes (Addendum)
[name] ([id number]) Visit Report for 11/18/2016 Arrival Information Details [name] Date of Service: 11/18/2016 9:45 AM Patient Name: N. Patient Account Number: [id number] Medical Record Treating RN: Phillis Haggis [id number] Number: Other Clinician: 1981/08/02 (36 y.o. [name] Date of Birth/Sex: Female) [name]/Extender: G Primary Care Nyaire Denbleyker: [name] Referring Nimsi Males: [name] in Treatment: 52 Visit Information History Since Last Visit All ordered tests and consults were completed: No Patient Arrived: Wheel Chair Added or deleted any medications: No Arrival Time: 09:46 Any new allergies or adverse reactions: No Accompanied By: mother Had a fall or experienced change in No Transfer Assistance: [employer] activities of daily living that may affect Patient Lift risk of falls: Patient Identification Verified: Yes Signs or symptoms of abuse/neglect since last No Secondary Verification Process Yes visito Completed: Hospitalized since last visit: No Patient Requires Transmission- No Has Dressing in Place as Prescribed: Yes Based Precautions: Has Compression in Place as Prescribed: Yes Patient Has Alerts: Yes Pain Present Now: Yes Electronic Signature(s) Signed: 11/18/2016 5:13:02 PM By: [name] Entered By: [name] on 11/18/2016 09:46:56 [name] ([id number]) -------------------------------------------------------------------------------- Clinic Level of Care Assessment Details [name] Date of Service: 11/18/2016 9:45 AM Patient Name: N. Patient Account Number: [id number] Medical Record Treating RN: [name] [id number] Number: Other Clinician: [birth year] (36 y.o. [name] Date of Birth/Sex: Female) [name]/Extender: G Primary Care Keiasha Diep: [name] Referring Alila Sotero: [name] in Treatment: 12 Clinic Level of Care Assessment  Items TOOL 4 Quantity Score []  - Use when only [name] EandM is performed on FOLLOW-UP visit 0 ASSESSMENTS - Nursing Assessment / Reassessment X - Reassessment of Co-morbidities (includes updates in patient status) 1 10 X - Reassessment of Adherence to Treatment Plan 1 5 ASSESSMENTS - Wound and Skin Assessment / Reassessment []  - Simple Wound Assessment / Reassessment - one wound 0 X - Complex Wound Assessment / Reassessment - multiple wounds 8 5 []  - Dermatologic / Skin Assessment (not related to wound area) 0 ASSESSMENTS - Focused Assessment []  - Circumferential Edema Measurements - multi extremities 0 []  - Nutritional Assessment / Counseling / Intervention 0 []  - Lower Extremity Assessment (monofilament, tuning fork, pulses) 0 []  - Peripheral Arterial Disease Assessment (using hand held doppler) 0 ASSESSMENTS - Ostomy and/or Continence Assessment and Care []  - Incontinence Assessment and Management 0 []  - Ostomy Care Assessment and Management (repouching, etc.) 0 PROCESS - Coordination of Care X - Simple Patient / Family Education for ongoing care 1 15 []  - Complex (extensive) Patient / Family Education for ongoing care 0 []  - Staff obtains Consents, Records, Test Results / Process Orders 0 [name] ([id number]) []  - Staff telephones HHA, Nursing Homes / Clarify orders / etc 0 []  - Routine Transfer to another Facility (non-emergent condition) 0 []  - Routine Hospital Admission (non-emergent condition) 0 []  - New Admissions / Manufacturing engineer / Ordering NPWT, Apligraf, etc. 0 []  - Emergency Hospital Admission (emergent condition) 0 X - Simple Discharge Coordination 1 10 []  - Complex (extensive) Discharge Coordination 0 PROCESS - Special Needs []  - Pediatric / Minor Patient Management 0 []  - Isolation Patient Management 0 []  - Hearing / Language / Visual special needs 0 []  - Assessment of Community assistance (transportation, D/C planning, etc.) 0 X - Additional  assistance / Altered mentation 1 15 []  - Support Surface(s) Assessment (bed, cushion, seat, etc.) 0 INTERVENTIONS - Wound Cleansing / Measurement []  - Simple Wound Cleansing - one wound 0 X - Complex  Wound Cleansing - multiple wounds 8 5 X - Wound Imaging (photographs - any number of wounds) 1 5 []  - Wound Tracing (instead of photographs) 0 []  - Simple Wound Measurement - one wound 0 X - Complex Wound Measurement - multiple wounds 8 5 INTERVENTIONS - Wound Dressings X - Small Wound Dressing one or multiple wounds 2 10 X - Medium Wound Dressing one or multiple wounds 6 15 []  - Large Wound Dressing one or multiple wounds 0 []  - Application of Medications - topical 0 []  - Application of Medications - injection 0 [name], [name] ([id number]) INTERVENTIONS - Miscellaneous []  - External ear exam 0 []  - Specimen Collection (cultures, biopsies, blood, body fluids, etc.) 0 []  - Specimen(s) / Culture(s) sent or taken to Lab for analysis 0 []  - Patient Transfer (multiple staff / Michiel Sites Lift / Similar devices) 0 []  - Simple Staple / Suture removal (25 or less) 0 []  - Complex Staple / Suture removal (26 or more) 0 []  - Hypo / Hyperglycemic Management (close monitor of Blood Glucose) 0 []  - Ankle / Brachial Index (ABI) - do not check if billed separately 0 X - Vital Signs 1 5 Has the patient been seen at the hospital within the last three years: Yes Total Score: 295 Level Of Care: New/Established - Level 5 Electronic Signature(s) Signed: 11/18/2016 5:23:30 PM By: [name] Entered By: [name] on 11/18/2016 13:59:55 [name] ([id number]) -------------------------------------------------------------------------------- Encounter Discharge Information Details [name] Date of Service: 11/18/2016 9:45 AM Patient Name: N. Patient Account Number: [id number] Medical Record Treating RN: Phillis Haggis [id number] Number: Other Clinician: 27-Apr-1981 (35 y.o.  [name] Date of Birth/Sex: Female) [name]/Extender: G Primary Care Aadon Gorelik: [name] Referring Kisha Messman: [name] in Treatment: 31 Encounter Discharge Information Items Discharge Pain Level: 2 Discharge Condition: Stable Ambulatory Status: Wheelchair Discharge Destination: Home Transportation: Private Auto Accompanied By: mother Schedule Follow-up Appointment: Yes Medication Reconciliation completed and provided to Patient/Care No [name] Beveridge: Provided on Clinical Summary of Care: 11/18/2016 Form Type Recipient Paper Patient HB Electronic Signature(s) Signed: 11/18/2016 11:12:49 AM By: Gwenlyn Perking Entered By: Gwenlyn Perking on 11/18/2016 11:12:49 [name] ([id number]) -------------------------------------------------------------------------------- General Visit Notes Details [name] Date of Service: 11/18/2016 9:45 AM Patient Name: N. Patient Account Number: [id number] Medical Record Treating RN: [name] [id number] Number: Other Clinician: 06/01/1981 (35 y.o. [name] Date of Birth/Sex: Female) Callista Hoh/Extender: G Primary Care Kimiye Strathman: [name] Referring Kodee Drury: [name] in Treatment: 75 Notes Patient presents with [name] area of crepitus and bulging redness in her right upper thigh. Dr Leanord Hawking did a thorough exam of the area and spoke at length and in depth to patient's mother and patient about the need for hospitalization for IV abx and close monitoring of this situation. Electronic Signature(s) Signed: 11/18/2016 5:23:30 PM By: [name] Entered By: [name] on 11/18/2016 10:30:32 [name] ([id number]) -------------------------------------------------------------------------------- Lower Extremity Assessment Details [name] Date of Service: 11/18/2016 9:45 AM Patient Name: N. Patient Account Number: [id number] Medical Record Treating RN:  Phillis Haggis [id number] Number: Other Clinician: [birth year] (36 y.o. [name] Date of Birth/Sex: Female) Valmore Arabie/Extender: G Primary Care Gagandeep Pettet: [name] Referring Adolphus Hanf: [name] in Treatment: 63 Vascular Assessment Pulses: Dorsalis Pedis Palpable: [Left:Yes] Posterior Tibial Extremity colors, hair growth, and conditions: Extremity Color: [Left:Normal] Temperature of Extremity: [Left:Warm] Electronic Signature(s) Signed: 11/18/2016 5:13:02 PM By: [name] Entered By: [name] on 11/18/2016 10:18:27 [name] ([id number]) -------------------------------------------------------------------------------- Multi Wound Chart  Details [name] Date of Service: 11/18/2016 9:45 AM Patient Name: N. Patient Account Number: [id number] Medical Record Treating RN: Phillis Haggis [id number] Number: Other Clinician: 1981-06-14 (36 y.o. 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] [18:Left Ischial Tuberosity Left Foot - Dorsal] Wounding Event: [16:Pressure Injury] [18:Pressure Injury] [20:Pressure Injury] Primary Etiology: [16:Pressure Ulcer] [18:Pressure Ulcer] [20:Pressure Ulcer] Comorbid History: [16:Anemia, Type I Diabetes, Anemia, Type I Diabetes, Anemia, Type I Diabetes, End Stage Renal Disease, End Stage Renal Disease, End Stage Renal Disease, Rheumatoid Arthritis, Neuropathy] [18:Rheumatoid Arthritis, Neuropathy]  [20:Rheumatoid Arthritis, Neuropathy] Date Acquired: [16:04/02/2016]  [18:05/12/2016] [20:08/06/2016] Weeks of Treatment: [16:32] [18:24] [20:10] Wound Status: [16:Open] [18:Open] [20:Open] Measurements L x W x D 4.1x6.6x1.1 [18:3.6x2.3x0.2] [20:3x2.7x0.2] (cm) Area (cm) : [16:21.253] [18:6.503] [20:6.362] Volume (cm) : [16:23.378] [18:1.301] [20:1.272] % Reduction in Area: [16:-257.90%] [18:51.60%] [20:-275.10%] % Reduction in Volume: -3835.70% [18:3.10%] [20:-648.20%] Classification: [16:Category/Stage IV] [18:Category/Stage IV] [20:Unstageable/Unclassified] HBO Classification: [16:Grade 1] [18:N/A] [20:Grade 2] Exudate Amount: [16:Large] [18:Large] [20:Medium] Exudate Type: [16:Sanguinous] [18:Serosanguineous] [20:Serous] Exudate Color: [16:red] [18:red, brown] [20:amber] Wound Margin: [16:Distinct, outline attached Flat and Intact] [20:Flat and Intact] Granulation Amount: [16:Medium (34-66%)] [18:None Present (0%)] [20:Small (1-33%)] Granulation Quality: [16:Pink] [18:N/A] [20:Pink] Necrotic Amount: [16:Medium (34-66%)] [18:Large (67-100%)] [20:Medium (34-66%)] Necrotic Tissue: Eschar, Adherent Slough Adherent [employer], Adherent Slough Exposed Structures: Fat Layer (Subcutaneous Fat Layer (Subcutaneous Fascia: No Tissue) Exposed: Yes Tissue) Exposed: Yes Fat Layer (Subcutaneous Tendon: Yes Fascia: No Tissue) Exposed: No Fascia: No Tendon: No Tendon: No Muscle: No Muscle: No Muscle: No Joint: No Joint: No Joint: No Bone: No Bone: No Bone: No Limited to Skin Breakdown Epithelialization: None None None Periwound Skin Texture: Excoriation: Yes Excoriation: Yes Excoriation: No Induration: Yes Induration: No Induration: No Callus: No Callus: No Callus: No Crepitus: No Crepitus: No Crepitus: No Rash: No Rash: No Rash: No Scarring: No Scarring: No Scarring: No Periwound Skin Maceration: No Maceration: No Maceration: No Moisture: Dry/Scaly: No Dry/Scaly: No Dry/Scaly: No Periwound Skin Color: Erythema: Yes Erythema: Yes  Erythema: Yes Atrophie Blanche: No Atrophie Blanche: No Atrophie Blanche: No Cyanosis: No Cyanosis: No Cyanosis: No Ecchymosis: No Ecchymosis: No Ecchymosis: No Hemosiderin Staining: No Hemosiderin Staining: No Hemosiderin Staining: No Mottled: No Mottled: No Mottled: No Pallor: No Pallor: No Pallor: No Rubor: No Rubor: No Rubor: No Erythema Location: Circumferential Circumferential Circumferential Erythema Change: No Change N/A N/A Temperature: No Abnormality N/A N/A Tenderness on Yes No No Palpation: Wound Preparation: Ulcer Cleansing: Ulcer Cleansing: Ulcer Cleansing: Rinsed/Irrigated with Rinsed/Irrigated with Rinsed/Irrigated with Saline Saline Saline Topical Anesthetic Topical Anesthetic Topical Anesthetic Applied: Other: lidocaine Applied: Other: lidocaine Applied: Other: lidocaine 4% 4% 4% Wound Number: [id number]  Photos: No Photos No Photos No Photos Wound Location: Left Foot - Lateral Right Ischial Tuberosity Right Amputation Site - Above Knee - Lateral Wounding Event: Pressure Injury Pressure Injury Pimple Primary Etiology: Pressure Ulcer Pressure Ulcer Cyst Comorbid History: Anemia, Type I Diabetes, N/A Anemia, Type I Diabetes, End Stage Renal Disease, End Stage Renal Disease, Rheumatoid Arthritis, Rheumatoid Arthritis, Neuropathy Neuropathy [name], [name] ([id number]) Date Acquired: 08/06/2016 09/07/2016 11/09/2016 Weeks of Treatment: 10 10 1  Wound Status: Open Open Open Measurements L x W x D 0.9x1.4x0.2 1x1.2x0.1 0.5x0.2x0.2 (cm) Area (  cm) : 0.99 0.942 0.079 Volume (cm) : 0.198 0.094 0.016 % Reduction in Area: 37.00% 37.50% -154.80% % Reduction in Volume: -26.10% 37.70% -23.10% Classification: Unstageable/Unclassified Category/Stage II Full Thickness Without Exposed Support Structures HBO Classification: Grade 1 N/A Grade 1 Exudate Amount: Small N/A Large Exudate Type: Serous N/A Purulent Exudate Color: amber N/A yellow, brown,  green Wound Margin: Flat and Intact N/A Flat and Intact Granulation Amount: None Present (0%) N/A Large (67-100%) Granulation Quality: N/A N/A N/A Necrotic Amount: Large (67-100%) N/A None Present (0%) Necrotic Tissue: Eschar, Adherent Slough N/A N/A Exposed Structures: Fascia: No N/A Fascia: No Fat Layer (Subcutaneous Fat Layer (Subcutaneous Tissue) Exposed: No Tissue) Exposed: No Tendon: No Tendon: No Muscle: No Muscle: No Joint: No Joint: No Bone: No Bone: No Limited to Skin Limited to Skin Breakdown Breakdown Epithelialization: None N/A None Periwound Skin Texture: Induration: Yes No Abnormalities Noted Excoriation: No Excoriation: No Induration: No Callus: No Callus: No Crepitus: No Crepitus: No Rash: No Rash: No Scarring: No Scarring: No Periwound Skin Maceration: No No Abnormalities Noted Maceration: No Moisture: Dry/Scaly: No Dry/Scaly: No Periwound Skin Color: Ecchymosis: Yes No Abnormalities Noted Atrophie Blanche: No Atrophie Blanche: No Cyanosis: No Cyanosis: No Ecchymosis: No Erythema: No Erythema: No Hemosiderin Staining: No Hemosiderin Staining: No Mottled: No Mottled: No Pallor: No Pallor: No Rubor: No Rubor: No Erythema Location: N/A N/A N/A Erythema Change: N/A N/A N/A Temperature: N/A N/A No Abnormality [name], [name] ([id number]) Tenderness on No No Yes Palpation: Wound Preparation: Ulcer Cleansing: N/A Ulcer Cleansing: Rinsed/Irrigated with Rinsed/Irrigated with Saline Saline Topical Anesthetic Topical Anesthetic Applied: Other: lidocaine Applied: None 4% Wound Number: [id number] N/A Photos: No Photos No Photos N/A Wound Location: Left Amputation Site - Left Calcaneous N/A Transmetatarsal - Distal Wounding Event: Gradually Appeared Pressure Injury N/A Primary Etiology: Diabetic Wound/Ulcer of Diabetic Wound/Ulcer of N/A the Lower Extremity the Lower Extremity Comorbid History: Anemia, Type I Diabetes, Anemia, Type I  Diabetes, N/A End Stage Renal Disease, End Stage Renal Disease, Rheumatoid Arthritis, Rheumatoid Arthritis, Neuropathy Neuropathy Date Acquired: 11/11/2016 07/28/2015 N/A Weeks of Treatment: 0 63 N/A Wound Status: Open Open N/A Measurements L x W x D 1x1.7x0.6 4.5x2.8x0.2 N/A (cm) Area (cm) : 1.335 9.896 N/A Volume (cm) : 0.801 1.979 N/A % Reduction in Area: N/A 46.70% N/A % Reduction in Volume: N/A -6.60% N/A Classification: Grade 2 Grade 2 N/A HBO Classification: N/A N/A N/A Exudate Amount: Medium Large N/A Exudate Type: Sanguinous Serosanguineous N/A Exudate Color: red red, brown N/A Wound Margin: Indistinct, nonvisible Flat and Intact N/A Granulation Amount: None Present (0%) None Present (0%) N/A Granulation Quality: N/A N/A N/A Necrotic Amount: Large (67-100%) Large (67-100%) N/A Necrotic Tissue: Eschar, Adherent Slough N/A N/A Exposed Structures: Fat Layer (Subcutaneous Fat Layer (Subcutaneous N/A Tissue) Exposed: Yes Tissue) Exposed: Yes Fascia: No Fascia: No Tendon: No Tendon: No Muscle: No Muscle: No Joint: No Joint: No Bone: No Bone: No Epithelialization: None None N/A Periwound Skin Texture: No Abnormalities Noted N/A [name], [name] ([id number]) Excoriation: No Induration: No Callus: No Crepitus: No Rash: No Scarring: No Periwound Skin Maceration: Yes Maceration: No N/A Moisture: Dry/Scaly: No Periwound Skin Color: No Abnormalities Noted Atrophie Blanche: No N/A Cyanosis: No Ecchymosis: No Erythema: No Hemosiderin Staining: No Mottled: No Pallor: No Rubor: No Erythema Location: N/A N/A N/A Erythema Change: N/A N/A N/A Temperature: N/A N/A N/A Tenderness on No No N/A Palpation: Wound Preparation: Ulcer Cleansing: Ulcer Cleansing: N/A Rinsed/Irrigated with Rinsed/Irrigated with Saline Saline Topical Anesthetic Topical Anesthetic Applied: Other:  lidocaine Applied: Other: lidocaine 4% Treatment Notes Electronic  Signature(s) Signed: 11/18/2016 4:59:22 PM By: Baltazar Najjar MD Entered By: Baltazar Najjar on 11/18/2016 12:47:39 [name] ([id number]) -------------------------------------------------------------------------------- Multi-Disciplinary Care Plan Details [name] Date of Service: 11/18/2016 9:45 AM Patient Name: N. Patient Account Number: [id number] Medical Record Treating RN: Phillis Haggis [id number] Number: Other Clinician: 1981-09-28 (36 y.o. [name] Date of Birth/Sex: Female) Damyan Corne/Extender: G Primary Care Daphna Lafuente: [name] Referring Raenah Murley: [name] in Treatment: 54 Active Inactive Electronic Signature(s) Signed: 12/22/2016 3:06:31 PM By: Elliot Gurney RN, [name], Kim RN, [name] Signed: 01/26/2017 4:43:54 PM By: [name] Previous Signature: 11/18/2016 5:13:02 PM Version By: [name] Entered By: Elliot Gurney RN, [name], Kim on 12/22/2016 15:06:29 [name] ([id number]) -------------------------------------------------------------------------------- Non-Wound Condition Assessment Details [name] Date of Service: 11/18/2016 9:45 AM Patient Name: N. Patient Account Number: [id number] Medical Record Treating RN: [name] [id number] Number: Other Clinician: 1980/12/04 (36 y.o. [name] Date of Birth/Sex: Female) Shauna Bodkins/Extender: G Primary Care Jailine Lieder: [name] Referring Trenise Turay: [name] in Treatment: 87 Non-Wound Condition: Condition: Soft Tissue Infection Location: Leg Side: Right Notes: raised area reddened with crepitus, marked with a sharpie Photos Periwound Skin Texture Texture Color No Abnormalities Noted: No No Abnormalities Noted: No Moisture No Abnormalities Noted: No Electronic Signature(s) Signed: 11/18/2016 12:54:46 PM By: [name] Entered By: [name] on 11/18/2016 12:54:46 [name]  ([id number]) -------------------------------------------------------------------------------- Pain Assessment Details [name] Date of Service: 11/18/2016 9:45 AM Patient Name: N. Patient Account Number: [id number] Medical Record Treating RN: Phillis Haggis [id number] Number: Other Clinician: 12-28-1980 (36 y.o. [name] Date of Birth/Sex: Female) Onika Gudiel/Extender: G Primary Care Amorina Doerr: [name] Referring Shaquoia Miers: [name] in Treatment: 64 Active Problems Location of Pain Severity and Description of Pain Patient Has Paino Yes Site Locations Pain Location: Pain in Ulcers With Dressing Change: Yes Rate the pain. Current Pain Level: 6 Character of Pain Describe the Pain: Aching Pain Management and Medication Current Pain Management: Electronic Signature(s) Signed: 11/18/2016 5:13:02 PM By: [name] Entered By: [name] on 11/18/2016 09:47:16 [name] ([id number]) -------------------------------------------------------------------------------- Patient/Caregiver Education Details [name] Date of Service: 11/18/2016 9:45 AM Patient Name: N. Patient Account Number: [id number] Medical Record Treating RN: Phillis Haggis [id number] Number: Other Clinician: Feb 10, 1981 (35 y.o. [name] Date of Birth/Gender: Female) Physician/Extender: G Primary Care Weeks in Treatment: 6 [name] Physician: Referring Physician: [name] Education Assessment Education Provided To: Patient Education Topics Provided Wound/Skin Impairment: Handouts: Other: change dressings as directed Methods: Demonstration, Explain/Verbal Responses: State content correctly Electronic Signature(s) Signed: 11/18/2016 5:13:02 PM By: [name] Entered By: [name] on 11/18/2016 10:12:10 [name]  ([id number]) -------------------------------------------------------------------------------- Wound Assessment Details [name] Date of Service: 11/18/2016 9:45 AM Patient Name: N. Patient Account Number: [id number] Medical Record Treating RN: Phillis Haggis [id number] Number: Other Clinician: 03-Jul-1981 (36 y.o. 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 Wound Open Status: Wounding Event: Pressure Injury Comorbid Anemia, Type I Diabetes, End Stage Date Acquired: 04/02/2016 History: Renal Disease, Rheumatoid Arthritis, Weeks Of Treatment: 32 Neuropathy Clustered Wound: No Photos Photo Uploaded By: [name] on 11/18/2016 12:56:03 Wound Measurements Length: (cm) 4.1 Width: (cm) 6.6 Depth: (cm) 1.1 Area: (cm) 21.253 Volume: (cm) 23.378 % Reduction in Area: -257.9% % Reduction in Volume: -  3835.7% Epithelialization: None Tunneling: No Wound Description Classification: Category/Stage IV Foul O Diabetic Severity (Wagner): Grade 1 Slough Wound Margin: Distinct, outline attached Exudate Amount: Large Exudate Type: Sanguinous Exudate Color: red dor After Cleansing: No /Fibrino Yes Wound Bed Mullenbach, [name] ([id number]) Granulation Amount: Medium (34-66%) Exposed Structure Granulation Quality: Pink Fascia Exposed: No Necrotic Amount: Medium (34-66%) Fat Layer (Subcutaneous Tissue) Exposed: Yes Necrotic Quality: Eschar, Adherent Slough Tendon Exposed: Yes Muscle Exposed: No Joint Exposed: No Bone Exposed: No Periwound Skin Texture Texture Color No Abnormalities Noted: No No Abnormalities Noted: No Callus: No Atrophie Blanche: No Crepitus: No Cyanosis: No Excoriation: Yes Ecchymosis:  No Induration: Yes Erythema: Yes Rash: No Erythema Location: Circumferential Scarring: No Erythema Change: No Change Hemosiderin Staining: No Moisture Mottled: No No Abnormalities Noted: No Pallor: No Dry / Scaly: No Rubor: No Maceration: No Temperature / Pain Temperature: No Abnormality Tenderness on Palpation: Yes Wound Preparation Ulcer Cleansing: Rinsed/Irrigated with Saline Topical Anesthetic Applied: Other: lidocaine 4%, Electronic Signature(s) Signed: 11/18/2016 5:13:02 PM By: [name] Entered By: [name] on 11/18/2016 10:09:40 [name] ([id number]) -------------------------------------------------------------------------------- Wound Assessment Details [name] Date of Service: 11/18/2016 9:45 AM Patient Name: N. Patient Account Number: [id number] Medical Record Treating RN: Phillis Haggis [id number] Number: Other Clinician: Feb 12, 1981 (36 y.o. [name] Date of Birth/Sex: Female) Rajah Lamba/Extender: G Primary Care Derel Mcglasson: [name] Referring Gladyes Kudo: [name] in Treatment: 36 Wound Status Wound Number: 18 Primary Pressure Ulcer Etiology: Wound Location: Left Ischial Tuberosity Wound Open Wounding Event: Pressure Injury Status: Date Acquired: 05/12/2016 Comorbid Anemia, Type I Diabetes, End Stage Weeks Of Treatment: 24 History: Renal Disease, Rheumatoid Arthritis, Clustered Wound: No Neuropathy Photos Photo Uploaded By: [name] on 11/18/2016 12:56:03 Wound Measurements Length: (cm) 3.6 Width: (cm) 2.3 Depth: (cm) 0.2 Area: (cm) 6.503 Volume: (cm) 1.301 % Reduction in Area: 51.6% % Reduction in Volume: 3.1% Epithelialization: None Tunneling: No Undermining: No Wound Description Classification: Category/Stage IV Wound Margin: Flat and Intact Exudate Amount: Large Exudate Type: Serosanguineous Exudate Color: red, brown Foul Odor After Cleansing:  No Slough/Fibrino Yes Wound Bed Granulation Amount: None Present (0%) Exposed Structure Perreira, Blaze N. ([id number]) Necrotic Amount: Large (67-100%) Fascia Exposed: No Necrotic Quality: Adherent Slough Fat Layer (Subcutaneous Tissue) Exposed: Yes Tendon Exposed: No Muscle Exposed: No Joint Exposed: No Bone Exposed: No Periwound Skin Texture Texture Color No Abnormalities Noted: No No Abnormalities Noted: No Callus: No Atrophie Blanche: No Crepitus: No Cyanosis: No Excoriation: Yes Ecchymosis: No Induration: No Erythema: Yes Rash: No Erythema Location: Circumferential Scarring: No Hemosiderin Staining: No Mottled: No Moisture Pallor: No No Abnormalities Noted: No Rubor: No Dry / Scaly: No Maceration: No Wound Preparation Ulcer Cleansing: Rinsed/Irrigated with Saline Topical Anesthetic Applied: Other: lidocaine 4%, Electronic Signature(s) Signed: 11/18/2016 5:13:02 PM By: [name] Entered By: [name] on 11/18/2016 10:08:12 [name] ([id number]) -------------------------------------------------------------------------------- Wound Assessment Details [name] Date of Service: 11/18/2016 9:45 AM Patient Name: N. Patient Account Number: [id number] Medical Record Treating RN: Phillis Haggis [id number] Number: Other Clinician: 20-Feb-1981 (36 y.o. [name] Date of Birth/Sex: Female) Mischa Pollard/Extender: G Primary Care Brietta Manso: [name] Referring Esai Stecklein: [name] in Treatment: 35 Wound Status Wound Number: 20 Primary Pressure Ulcer Etiology: Wound Location: Left Foot - Dorsal Wound Open Wounding Event: Pressure Injury Status: Date Acquired: 08/06/2016 Comorbid Anemia, Type I Diabetes, End Stage Weeks Of Treatment: 10 History: Renal Disease, Rheumatoid Arthritis, Clustered Wound: No Neuropathy Photos Photo Uploaded By: [name] on 11/18/2016 12:56:40 Wound  Measurements Length: (  cm) 3 Width: (cm) 2.7 Depth: (cm) 0.2 Area: (cm) 6.362 Volume: (cm) 1.272 % Reduction in Area: -275.1% % Reduction in Volume: -648.2% Epithelialization: None Tunneling: No Undermining: No Wound Description Classification: Unstageable/Unclassified Foul Od Diabetic Severity (Wagner): Grade 2 Slough/ Wound Margin: Flat and Intact Exudate Amount: Medium Exudate Type: Serous Exudate Color: amber or After Cleansing: No Fibrino No Wound Bed [name], [name] ([id number]) Granulation Amount: Small (1-33%) Exposed Structure Granulation Quality: Pink Fascia Exposed: No Necrotic Amount: Medium (34-66%) Fat Layer (Subcutaneous Tissue) Exposed: No Necrotic Quality: Eschar, Adherent Slough Tendon Exposed: No Muscle Exposed: No Joint Exposed: No Bone Exposed: No Limited to Skin Breakdown Periwound Skin Texture Texture Color No Abnormalities Noted: No No Abnormalities Noted: No Callus: No Atrophie Blanche: No Crepitus: No Cyanosis: No Excoriation: No Ecchymosis: No Induration: No Erythema: Yes Rash: No Erythema Location: Circumferential Scarring: No Hemosiderin Staining: No Mottled: No Moisture Pallor: No No Abnormalities Noted: No Rubor: No Dry / Scaly: No Maceration: No Wound Preparation Ulcer Cleansing: Rinsed/Irrigated with Saline Topical Anesthetic Applied: Other: lidocaine 4%, Electronic Signature(s) Signed: 11/18/2016 5:13:02 PM By: [name] Entered By: [name] on 11/18/2016 09:54:27 [name] ([id number]) -------------------------------------------------------------------------------- Wound Assessment Details [name] Date of Service: 11/18/2016 9:45 AM Patient Name: N. Patient Account Number: [id number] Medical Record Treating RN: Phillis Haggis [id number] Number: Other Clinician: April 09, 1981 (36 y.o. [name] Date of Birth/Sex: Female) Gita Dilger/Extender: G Primary Care  Arleta Ostrum: [name] Referring Skiler Tye: [name] in Treatment: 48 Wound Status Wound Number: 21 Primary Pressure Ulcer Etiology: Wound Location: Left Foot - Lateral Wound Open Wounding Event: Pressure Injury Status: Date Acquired: 08/06/2016 Comorbid Anemia, Type I Diabetes, End Stage Weeks Of Treatment: 10 History: Renal Disease, Rheumatoid Arthritis, Clustered Wound: No Neuropathy Photos Photo Uploaded By: [name] on 11/18/2016 12:56:40 Wound Measurements Length: (cm) 0.9 Width: (cm) 1.4 Depth: (cm) 0.2 Area: (cm) 0.99 Volume: (cm) 0.198 % Reduction in Area: 37% % Reduction in Volume: -26.1% Epithelialization: None Tunneling: No Undermining: No Wound Description Classification: Unstageable/Unclassified Foul Od Diabetic Severity (Wagner): Grade 1 Wound Margin: Flat and Intact Exudate Amount: Small Exudate Type: Serous Exudate Color: amber or After Cleansing: No Wound Bed [name], [name] ([id number]) Granulation Amount: None Present (0%) Exposed Structure Necrotic Amount: Large (67-100%) Fascia Exposed: No Necrotic Quality: Eschar, Adherent Slough Fat Layer (Subcutaneous Tissue) Exposed: No Tendon Exposed: No Muscle Exposed: No Joint Exposed: No Bone Exposed: No Limited to Skin Breakdown Periwound Skin Texture Texture Color No Abnormalities Noted: No No Abnormalities Noted: No Callus: No Atrophie Blanche: No Crepitus: No Cyanosis: No Excoriation: No Ecchymosis: Yes Induration: Yes Erythema: No Rash: No Hemosiderin Staining: No Scarring: No Mottled: No Pallor: No Moisture Rubor: No No Abnormalities Noted: No Dry / Scaly: No Maceration: No Wound Preparation Ulcer Cleansing: Rinsed/Irrigated with Saline Topical Anesthetic Applied: Other: lidocaine 4%, Electronic Signature(s) Signed: 11/18/2016 5:13:02 PM By: [name] Entered By: [name] on 11/18/2016 09:55:36 [name]  ([id number]) -------------------------------------------------------------------------------- Wound Assessment Details [name] Date of Service: 11/18/2016 9:45 AM Patient Name: N. Patient Account Number: [id number] Medical Record Treating RN: Phillis Haggis [id number] Number: Other Clinician: 08-26-1981 (36 y.o. [name] Date of Birth/Sex: Female) Eldred Lievanos/Extender: G Primary Care Lydia Meng: [name] Referring Marquell Saenz: [name] in Treatment: 41 Wound Status Wound Number: 22 Primary Etiology: Pressure Ulcer Wound Location: Right Ischial Tuberosity Wound Status: Open Wounding Event: Pressure Injury Date Acquired: 09/07/2016 Weeks Of Treatment: 10 Clustered Wound: No Photos Photo Uploaded By: [name] on 11/18/2016  12:57:30 Wound Measurements Length: (cm) 1 Width: (cm) 1.2 Depth: (cm) 0.1 Area: (cm) 0.942 Volume: (cm) 0.094 % Reduction in Area: 37.5% % Reduction in Volume: 37.7% Wound Description Classification: Category/Stage II Periwound Skin Texture Texture Color No Abnormalities Noted: No No Abnormalities Noted: No Moisture No Abnormalities Noted: No KATHELENE, RUMBERGER ([id number]) Electronic Signature(s) Signed: 11/18/2016 5:13:02 PM By: [name] Entered By: [name] on 11/18/2016 10:09:11 [name] ([id number]) -------------------------------------------------------------------------------- Wound Assessment Details [name] Date of Service: 11/18/2016 9:45 AM Patient Name: N. Patient Account Number: [id number] Medical Record Treating RN: Phillis Haggis [id number] Number: Other Clinician: 04/25/1981 (36 y.o. 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 Wound Open Status: Wounding Event: Pimple Comorbid Anemia, Type I Diabetes, End Stage Date Acquired: 11/09/2016 History: Renal Disease, Rheumatoid Arthritis, Weeks Of Treatment: 1 Neuropathy Clustered Wound: No Photos Photo Uploaded By: [name] on 11/18/2016 12:57:30 Wound Measurements Length: (cm) 0.5 Width: (cm) 0.2 Depth: (cm) 0.2 Area: (cm) 0.079 Volume: (cm) 0.016 % Reduction in Area: -154.8% % Reduction in Volume: -23.1% Epithelialization: None Tunneling: No Undermining: No Wound Description Full Thickness Without Classification: Exposed Support Structures Diabetic Severity Grade 1 (Wagner): Wound Margin: Flat and Intact Exudate Amount: Large Exudate Type: Purulent Exudate Color: yellow, brown, green [name], [name] ([id number]) Foul Odor After Cleansing: No Slough/Fibrino No Wound Bed Granulation Amount: Large (67-100%) Exposed Structure Necrotic Amount: None Present (0%) Fascia Exposed: No Fat Layer (Subcutaneous Tissue) Exposed: No Tendon Exposed: No Muscle Exposed: No Joint Exposed: No Bone Exposed: No Limited to Skin Breakdown Periwound Skin Texture Texture Color No Abnormalities Noted: No No Abnormalities Noted: No Callus: No Atrophie Blanche: No Crepitus: No Cyanosis: No Excoriation: No Ecchymosis: No Induration: No Erythema: No Rash: No Hemosiderin Staining: No Scarring: No Mottled: No Pallor: No Moisture Rubor: No No Abnormalities Noted: No Dry / Scaly: No Temperature / Pain Maceration: No Temperature: No Abnormality Tenderness on Palpation: Yes Wound Preparation Ulcer Cleansing: Rinsed/Irrigated with Saline Topical Anesthetic Applied: None Electronic Signature(s) Signed: 11/18/2016 5:13:02 PM By: [name] Entered By: [name] on 11/18/2016 09:52:53 [name] ([id number]) -------------------------------------------------------------------------------- Wound  Assessment Details [name] Date of Service: 11/18/2016 9:45 AM Patient Name: N. Patient Account Number: [id number] Medical Record Treating RN: Phillis Haggis [id number] Number: Other Clinician: 03-17-1981 (36 y.o. 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 Wound Open Status: Wounding Event: Gradually Appeared Comorbid Anemia, Type I Diabetes, End Stage Date Acquired: 11/11/2016 History: Renal Disease, Rheumatoid Arthritis, Weeks Of Treatment: 0 Neuropathy Clustered Wound: No Photos Photo Uploaded By: [name] on 11/18/2016 12:58:06 Wound Measurements Length: (cm) 1 Width: (cm) 1.7 Depth: (cm) 0.6 Area: (cm) 1.335 Volume: (cm) 0.801 % Reduction in Area: % Reduction in Volume: Epithelialization: None Tunneling: No Undermining: No Wound Description Classification: Grade 2 Wound Margin: Indistinct, nonvisible Exudate Amount: Medium Exudate Type: Sanguinous Exudate Color: red Foul Odor After Cleansing: No Slough/Fibrino Yes Wound Bed Granulation Amount: None Present (0%) Exposed Structure [name], [name] ([id number]) Necrotic Amount: Large (67-100%) Fascia Exposed: No Necrotic Quality: Eschar, Adherent Slough Fat Layer (Subcutaneous Tissue) Exposed: Yes Tendon Exposed: No Muscle  Exposed: No Joint Exposed: No Bone Exposed: No Periwound Skin Texture Texture Color No Abnormalities Noted: No No Abnormalities Noted: No Moisture No Abnormalities Noted: No Maceration: Yes Wound Preparation Ulcer Cleansing: Rinsed/Irrigated with Saline Topical Anesthetic Applied: Other: lidocaine, Electronic Signature(s) Signed: 11/18/2016 5:13:02 PM By: [name] Entered  By: [name] on 11/18/2016 10:00:53 [name] ([id number]) -------------------------------------------------------------------------------- Wound Assessment Details [name] Date of Service: 11/18/2016 9:45 AM Patient Name: N. Patient Account Number: [id number] Medical Record Treating RN: Phillis Haggis [id number] Number: Other Clinician: [birth year] (36 y.o. [name] Date of Birth/Sex: Female) Janyia Guion/Extender: G Primary Care Conda Wannamaker: [name] Referring Carrin Vannostrand: [name] in Treatment: 63 Wound Status Wound Number: 9 Primary Diabetic Wound/Ulcer of the Lower Etiology: Extremity Wound Location: Left Calcaneous Wound Open Wounding Event: Pressure Injury Status: Date Acquired: 07/28/2015 Comorbid Anemia, Type I Diabetes, End Stage Weeks Of Treatment: 63 History: Renal Disease, Rheumatoid Arthritis, Clustered Wound: No Neuropathy Photos Photo Uploaded By: [name] on 11/18/2016 12:58:06 Wound Measurements Length: (cm) 4.5 Width: (cm) 2.8 Depth: (cm) 0.2 Area: (cm) 9.896 Volume: (cm) 1.979 % Reduction in Area: 46.7% % Reduction in Volume: -6.6% Epithelialization: None Tunneling: No Undermining: No Wound Description Classification: Grade 2 Wound Margin: Flat and Intact Exudate Amount: Large Exudate Type: Serosanguineous Exudate Color: red, brown Foul Odor After Cleansing: No Slough/Fibrino No Wound Bed Granulation Amount: None Present (0%) Exposed Structure [name], [name] ([id number]) Necrotic Amount: Large (67-100%) Fascia Exposed: No Fat Layer (Subcutaneous Tissue) Exposed: Yes Tendon Exposed: No Muscle Exposed: No Joint Exposed: No Bone Exposed: No Periwound Skin Texture Texture Color No Abnormalities Noted: No No Abnormalities Noted: No Callus: No Atrophie Blanche: No Crepitus: No Cyanosis: No Excoriation: No Ecchymosis: No Induration: No Erythema: No Rash:  No Hemosiderin Staining: No Scarring: No Mottled: No Pallor: No Moisture Rubor: No No Abnormalities Noted: No Dry / Scaly: No Maceration: No Wound Preparation Ulcer Cleansing: Rinsed/Irrigated with Saline Topical Anesthetic Applied: Other: lidocaine 4%, Electronic Signature(s) Signed: 11/18/2016 5:13:02 PM By: [name] Entered By: [name] on 11/18/2016 09:57:40 [name] ([id number]) -------------------------------------------------------------------------------- Vitals Details [name] Date of Service: 11/18/2016 9:45 AM Patient Name: N. Patient Account Number: [id number] Medical Record Treating RN: Phillis Haggis [id number] Number: Other Clinician: 1980-12-03 (35 y.o. [name] Date of Birth/Sex: Female) Aybree Lanyon/Extender: G Primary Care Maeli Spacek: [name] Referring Tatym Schermer: [name] in Treatment: 8 Vital Signs Time Taken: 09:47 Temperature (F): 98.1 Height (in): 68 Pulse (bpm): 91 Weight (lbs): 96 Respiratory Rate (breaths/min): 16 Body Mass Index (BMI): 14.6 Blood Pressure (mmHg): 109/47 Reference Range: 80 - 120 mg / dl Electronic Signature(s) Signed: 11/18/2016 5:13:02 PM By: [name] Entered By: [name] on 11/18/2016 09:51:21

## 2016-11-19 NOTE — Progress Notes (Signed)
[name], [name] [name] ([id number]) Visit Report for 11/18/2016 Chief Complaint Document Details [name] Date of Service: 11/18/2016 9:45 AM [name] Name: [name] [name] Account Number: [id number] [name] Record Treating RN: [name] [name] [id number] Number: Other Clinician: Jun 16, 1981 (36 y.o. Treating [name] [name] Date of Birth/Sex: Female) Provider/Extender: G Primary Care Provider: Rolin [name] Referring Provider: [name] [name] in Treatment: 24 Information Obtained from: [name] Chief Complaint [name] returns for evaluation of multiple ulcerations Electronic Signature(s) Signed: 11/18/2016 4:59:22 PM By: [name] Najjar MD Entered By: [name] [name] on 11/18/2016 12:47:54 [name] [name] ([id number]) -------------------------------------------------------------------------------- HPI Details [name] Date of Service: 11/18/2016 9:45 AM [name] Name: [name] [name] Account Number: [id number] [name] Record Treating RN: [name] [name] [id number] Number: Other Clinician: June 22, 1981 (35 y.o. Treating [name] Date of Birth/Sex: Female) Provider/Extender: G Primary Care Provider: Rolin [name] Referring Provider: [name] [name] in Treatment: 51 History of Present Illness Location: dry gangrene both feet and heels Quality: [name] reports No Pain. Severity: [name] states wound are getting worse. Duration: [name] has had the wound for > 4months prior to seeking treatment at the wound center Context: The wound appeared gradually over time Modifying Factors: she has been in and out of [name] over the last 2 months Associated Signs and Symptoms: [name] reports having difficulty standing for long periods. HPI Description: [name] [name] is [name] 36 y.o. female who presents to our wound center, back in June 2016, referred by her PCP [name] for nonhealing ulcers on the lateral aspect of the right heel. Of note she has [name] history of  type 1 diabetes mellitus that has been uncontrolled. Past [name] history significant for type 1 diabetes mellitus not controlled, ankylosing spondylitis, anorexia nervosa, irritable bowel syndrome, chronic kidney disease, chronic diarrhea. she then developed gangrene of both feet due to severe peripheral vascular disease and also had gangrene of the tips of her fingers due to upper extremity vascular disease. She was being worked up by vascular surgery at [name] and at [name] and has had several procedures done there. She started with hyperbaric oxygen therapy and had [name] total of 40 treatments the last one being on 06/20/2015. After the initial treatment of hyperbaric oxygen therapy she started having ear problems and had ultimately to use myringotomy tubes and this was done bilaterally. Since then her ears have been doing fine. In late September, she had seen vascular and hand surgeons. since then she's been in [name] at the rec center for surgery involving extensive vascular procedures for the upper extremities. She was then at [name] with GI bleeds both upper and lower and has been in and out of [name] for that. She has recently been out of [name] for the last week. 09/09/2015 -- she was unable to get here in time to start her hyperbaric oxygen today and hence is only here for [name] wound care visit. 09/19/2015 -- she has been having vancomycin during her dialysis and continues to have vascular appointments and the procedure is been set for early January. She has been unable to make it for her hemodialysis due to various [name] symptoms. 09/30/2014 -- her vancomycin was stopped on 09/25/2015 and the mother has noticed the right foot has started draining for the last 3 days. Addendum: after examining the [name] I was able to talk to her primary vascular surgeon [name] at the [name] I told him about the necrotic area on the plantar aspect of right foot which is now  wet gangrene and he agreed with me  that he would admit her at [name] under her care and synchronize further treatment. We have discussed her poor prognosis and he and I discussed the need for hospice care and for sitting down and talking to the [name] and her mother and giving them [name] proper detail of the [facility], [name] ([id number]) prognosis. 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 She was worked up with MRIs, abdomen aortogram and placement of left-sided angioplasties were done. After [name] prolonged [name] she she was discharged home and was told to wear shrinker sock and stump protector and see her surgeon for further instructions regarding wound care and suture removal. Asked to take long-term doxycycline. 01/15/16; this is [name] [name] I haven't seen before although she is been followed by Dr. Meyer [name] in this clinic today. She is [name] type I diabetic with severe PAD macrovascular disease. She has had [name] previous BKA. She has dry gangrene of the tips of her fingers which she showed me on the right to. She also has dry gangrene of the left first second and third toes and [name] portion of her proximal foot around these areas. She is followed by vascular surgery at [name] and saw them recently they are not going to do surgery as of yet. She has [name] large black eschar over her heel which is beginning to separate in some areas. As I understand think she is paining these with Betadine. There is been some suggestion about retrying hyperbarics on her although she is still not able to commit to the frequency of  treatment that would be necessary to see improvement. She is also on Monday Wednesday Friday dialysis 01/21/16; the [name] returns to see me today with regards to the left heel. Apparently she is not scheduled for any further attempts at revascularization of the left foot. 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 01/28/16; the [name] returns today for attention to the left foot specifically the left calcaneal ulcer. This is covered in [name] thick black eschar. I crosshatched this last week and we have been using Santyl. 02/05/16; I continue to work on the thick black eschar on the [name]'s left calcaneus. She is using Santyl that this side crosshatched this area and the eschar is beginning to loosen. She has dry gangrene involving [name] large area of the medial aspect of her foot extending into the first metatarsal head and involving the totality of her first and second toes. This is beginning to separate and liquefy as well especially between the first and second toes. In the time being her major complaint is fatigue at dialysis 02/26/16 I continue to work on the [name]'s left calcaneus thick adherent black eschar. I crosshatched this area and we have been using Santyl although it is very adherent area I remove some nonviable tissue today what I can see of this actually looks surprisingly good. On the same foot she has dry gangrene on the first and second toes and part of the forefoot underneath this. This is beginning to separate. 03/11/16; the [name] comes in for her every 2 week appointment.  I have been working on the black eschar on her heel. The [name] apparently again has to come off dialysis early today after 2 hours due to severe complaints of nausea. She really does not look well. 04/02/2016 -- the [name] has not been seen here for about 3 weeks now and has [name] new issue with the stump of the right amputation site  and also her right posterior thigh. They have only noticed this for the last couple of days. 04/28/16; I had received [name] call from the [name]'s surgeon at [name]. She had [name] left transmetatarsal amputation and Integra applied to the left heel. Both of these areas appear to be doing well. There are dressing these with [name] and they will return to their surgeon on Thursday. She has [name] new injury on the popliteal fossa on the right which I think was trauma from her stump. 05/20/16; the [name] is following with her surgeon at [name]. She's had [name] left transmetatarsal debridement of the left heel she had Integra place and apparently is using some consternation of Epson salts soaks, Betadine and Aquacel Ag. The left leg is wrapped I did not look at this today. She has the wound in her right popliteal fossa which is apparently [name] pressure area possibly related to sitting on [name] toilet for 2-3 hours multiple times [name] day. 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 We have been using Santyl to the area on the right popliteal fossa. She has [name] new wound on her right gluteal area but the [name] would not let me look at that today [name] [name], [name] [name] ([id number]) 06/03/16; [name] is not doing particularly well. She now has [name] pressure area over her left ischial tuberosity. This is of quite [name] size and covered with an adherent surface slough. She looks as though she has lost weight, I didn't want to go ahead and attempt to debridement this today. There is really no evidence of infection. The area behind the right popliteal fossa looks about the same as 2 weeks ago we have been using Santyl to this area. I did not look at her left foot which is wrapped been followed by podiatry at [name] 06/10/16; [name] arrives in clinic actually looking [name] lot brighter than I usually see her, predictably she did not go to dialysis today. For the first time in  perhaps 6 weeks I actually saw her left foot today. The transmetatarsal site is healed. The left heel has [name] reasonably stable-looking wound which has [name] clean base. Some eschar superiorly and [name] few sutures remain in place. More worrisome is an area on her lateral left foot over the metatarsal head. Quarter size necrotic wound that probes to bone. There is some purulence here which I cultured there is nothing that looks like [name] healthy base of this area. They've been using silver alginate to this area at home. She also has [name] large wound in the right popliteal fossa, and again [name] pressure area over the left ischial tuberosity. We are using Santyl to both of these areas. Both allays looks somewhat better than last week, using Santyl to both areas 06/17/16: culture from last week grew citrobacter and amp sensitive enterococcus fecalis. Started on vanc last Saturday at dialysis. have spoken to dialysis in [place] re adjustment in antibiotics added ceftazidine to vanc 06/24/16; the [name] was discharged from [facility] yesterday. She had an IandD of the open area on the lateral aspect  of her foot. She continues on vancomycin and Ceptaz again at dialysis although I'm not exactly sure of the current duration of this. She also had [name] debridement of the wound over the left greater trochanter done by the wound care team. She is receiving Santyl based dressings to this and the area in her right popliteal fossa. She arrives today completely fatigued from dialysis. I spoke to Dr. Allena Katz her podiatrist and surgeon at [name] and asked if we could manage the wound VAC which I think we can. He also wanted to ask about hyperbaric oxygen with the indication of chronic osteomyelitis although at this point I am not completely certain where the chronic osteomyelitis is. Finally she apparently has had [name] re-graft to the area on her left heel which is either [name] skin graft or Integra 07/15/16; [name] arrives today she is not been  in the clinic since I saw her on 9/27. She's been using Santyl to the left greater trochanter, I popliteal fossa. Her home health nurse remove the wound VAC from the transmetatarsal head, there is only [name] small wound on the medial aspect of the foot. Dr. Allena Katz who is been managing the heel wounds has requested Xeroform to the heel. 07/22/16; [name] arrives as usual postdialysis she is very fatigued and weak looking. She generally tolerates dialysis poorly requiring midodrine tosupport her blood pressure. She continues on vancomycin and I believe ceftazadine at dialysis for chronic osteomyelitis oShe has [name] wound on the lateral left transmetatarsal amputation. This was debrided of surface slough nonviable subcutaneous tissue the base of this looks healthy and improved. oHer left heel wound is followed by by Dr. Allena Katz at [name] her surgeon. We have been applying Xeroform to this at his request, he'll oShe has [name] deep wound over her left greater trochanter however this does not appear to be infected has healthy granulation and I think could be well served by [name] wound VAC with collagen under the foam oUnder the right popliteal fossa there is [name] pressure area apparently from [name] toilet seat. This has been making progress again the tissue here appears to be healthy we have been using silver collagen in this area as well oThere is [name] new small area on the lateral aspect of her right stump which appears to be draining purulent material. Our nurses obtained [name] specimen of this for culture. As noted she is already onVAncomycin e [name] third generation cephalosporin 08/05/16 -Ms. Sutherlin arrives today accompanied by her aunt and cousin, she did not receive dialysis today due to "feeling bad" and is scheduled to received dialysis tomorrow. She has completed vancomycin that she was receiving with dialysis. She has an appointment with podiatry on Friday; admits to using right prosthesis for transfers primarily -She has [name]  wound on the lateral left TMA site, essentially unchanged and stable; staples remain in medial [place], [name] ([id number]) aspect of healed surgical site from August, [name] states that the plan is to remove staples on Friday -Left heel ulcer appears stable with granulation tissue -stage III left trochanter pressure ulcer with peri-wound irritation, appears to be from leakage despite [name] stating that VAC has not leaked, has been using Prisma and wound VAC -Right popliteal fossa pressure ulcer is stable -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 and pale erythema to lateral aspect, improved from last week, although purulence able to be expressed, will encourage warm compresses to aide in bring fluid to surface - new areas of DTI to left foot dorsal and  lateral aspect; [name] admits that foot has been wrapped with ace per podiatry, she also wears surgical shoe to left foot; dorsal DTI has intact blister; dorsal DTI not present at last appointment, lateral DTI present but significantly smaller at last appointment; both areas (dorsal>lateral) concerning for threatening limb loss, new to size and location - she has seen "limb loss specialist" at [name] in the past and was encouraged to follow up with that provider in presence of new development 09/08/16 I have not seen this [name] in quite some time perhaps late October. She was seen once here at the beginning of November however her appointments are scheduled after dialysis at which time she doesn't feel well enough to come. Also noted recently our staff of taken several phone calls from her home health nurse that been concerned about worsening wounds on the back of the right knee left greater trochanter. We brought her in today to go over this. As far as I can tell the wounds are as follows; #1 pressure ulcer on the back of her right knee in the popliteal fossa. As best I can tell this is caused by prolonged sitting on the toilet seat  related to ongoing issues with profuse, high-volume diarrhea #2 pressure ulcer over the left greater trochanter. We have been using [name] wound VAC on this area #3 probing ulcer on the medial aspect of her left transmetatarsal amputation #4 pressure ulcer on the left posterior calcaneus #5 excoriation on the lateral aspect of her left foot which is not clearly open but was concerning last time she was here for [name] deep tissue injury. She is off antibiotics at dialysis since the last time I saw her. This was for underlying osteomyelitis. 09/16/16; she arrives today with the pressure ulcer on the back of her right knee still draining and looking somewhat worse. Culture I did last week grew Klebsiella and enterococcus which is ampicillin sensitive. I therefore have given her prescription for Augmentin 500/125 once [name] day post dialysis on dialysis days. The other concerning thing today is the ischemic-looking areas on the lateral left foot and the left anterior foot. She was supposed to see [name] at [name] today but they did not make it there. 09/30/16; since the [name] was last seen here on 12/20 she has been to see [name] at [name] and apparently he revascularized her left foot although I don't have any of these details. We are concerned last time with ischemic-looking wounds on the left heel left lateral foot. As well she is completed her antibiotics for the drainage coming out of the area on the right popliteal fossa and this looks somewhat better today. Finally she wants to have the VAC discontinued for [name] period of time stating that she finds it irritating. To be truthful the circumference of the wound over the left buttock seems better but the depth per our intake nurse today is actually worse making it somewhat difficult to argue with her 11/11/16; the [name] arrives back in clinic today not looking very well at all. Firstly she is almost immobilized by poorly defined pain including the right leg  greater than the left. Upper arms. She did not complain of back pain. We could not really move her very well in her chair because of pain. She had [name] recent revascularization of the right BKA leg by [name] at [name]. I can see his admission note but I can't really see what he did. Also when she was here the last  time I did [name] culture of the area behind her right popliteal fossa and gave her Augmentin. This was supposed to be after dialysis days for 5 treatments i.e. roughly 10 days however she took them on each day rather than on dialysis days however that would've been 5-6 [name] [name], [name] ([id number]) weeks ago his abdomen that long since we have seen her. She has home health coming out I think they've been using Iodoflex the most of these wound areas. As mentioned she is revascularized on the right. Our intake nurses noted [name] small draining sinus and they obtained [name] culture from this area which we sent off this is right on the tip of her amputation site on the right 11/18/16; [name] arrives this morning. I was called yesterday to report [name] erythematous area on the posterior right thigh in the midline by the [name]'s home health nurse. I was emailed some pictures of this which looked to be cellulitis although the [name] was not tender per report. I called her in Levaquin adjusted for her dialysis/renal function. She arrives today in follow-up. She is not systemically unwell however the area clearly has subcutaneous crepitus and tenderness of the underlying quadriceps muscle. I think this requires hospitalization. She also has cultures that we did last week coming out of tip of her stump showing both Proteus and [place]. The candidate would be an unusual true infection. Culture I did of the large wound over the popliteal fossa has exposed tendon but did not culture Electronic Signature(s) Signed: 11/18/2016 4:59:22 PM By: [name] Najjar MD Entered By: [name] [name] on 11/18/2016  12:50:12 [name] [name] ([id number]) -------------------------------------------------------------------------------- Physical Exam Details [name] [name] Date of Service: 11/18/2016 9:45 AM [name] Name: [name] [name] Account Number: [id number] [name] Record Treating RN: [name] [name] [id number] Number: Other Clinician: April 06, 1981 (35 y.o. Treating Emory Leaver Date of Birth/Sex: Female) Provider/Extender: G Primary Care Provider: Rolin [name] Referring Provider: [name] [name] in Treatment: 28 Constitutional Sitting or standing Blood Pressure is within target range for [name]. Pulse regular and within target range for [name].Marland Kitchen Respirations regular, non-labored and within target range.. Temperature is normal and within the target range for the [name]. [name]'s appearance is neat and clean. Appears in no acute distress. Well nourished and well developed.Marland Kitchen Respiratory Respiratory effort is easy and symmetric bilaterally. Rate is normal at rest and on room air.. Cardiovascular Heart rhythm and rate regular, without murmur or gallop.. Gastrointestinal (GI) Abdomen is soft and non-distended without masses or tenderness. Bowel sounds active in all quadrants.. Lymphatic Nonpalpable in the right popliteal or inguinal area.. Integumentary (Hair, Skin) Most worrisome thing about her exam today was this subcutaneous crepitus under the erythema on the posterior thigh. I felt her entire quadricep muscle was tender.. Notes Wound exam; the [name]'s 2 wounds over the right initial tuberosity and the left greater trochanter actually looked better. oLarge wound over the right popliteal fossa with exposed flexor tendon this is not overtly infected oWounds on the left foot including [name] new open area in the plantar midline of her transmetatarsal Electronic Signature(s) Signed: 11/18/2016 4:59:22 PM By: [name] Najjar MD Entered By: [name] [name] on 11/18/2016  12:53:47 [name] [name] ([id number]) -------------------------------------------------------------------------------- Physician Orders Details [name] [name] Date of Service: 11/18/2016 9:45 AM [name] Name: [name] [name] Account Number: [id number] [name] Record Treating RN: [name] [name] [id number] Number: Other Clinician: 11-May-1981 (35 y.o. Treating Joeangel Jeanpaul Date of Birth/Sex: Female) Provider/Extender: G Primary Care Provider: Rolin [name] Referring Provider: [name] [name] in Treatment: 82 Verbal /  Phone Orders: Yes Clinician: Curtis [name] Read Back and Verified: Yes Diagnosis Coding Wound Cleansing 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,Posterior Amputation Site - Below Knee o Clean wound with Normal Saline. o Cleanse wound with mild soap and water o May Shower, gently pat wound dry prior to applying new dressing. Wound #18 Left Ischial Tuberosity o Clean wound with Normal Saline. o Cleanse wound with mild soap and water o May Shower, gently pat wound dry prior to applying new dressing. Wound #20 Left,Dorsal Foot o Clean wound with Normal Saline. o Cleanse wound with mild soap and water o May Shower, gently pat wound dry prior to applying new dressing. Wound #21 Left,Lateral Foot o Clean wound with Normal Saline. o Cleanse wound with mild soap and water o May Shower, gently pat wound dry prior to applying new dressing. Wound #22 Right Ischial Tuberosity o Clean wound with Normal Saline. o Cleanse wound with mild soap and water o May Shower, gently pat wound dry prior to applying new dressing. Wound #23 Right,Lateral Amputation Site - Above Knee o Clean wound with Normal Saline. o Cleanse wound with mild soap and water o May Shower, gently pat wound dry prior to applying new dressing. Wound #24 Left,Distal Amputation Site - Transmetatarsal o Clean wound with Normal Saline. o Cleanse wound with mild soap and water o May  Shower, gently pat wound dry prior to applying new dressing. ASHIA, DEHNER ([id number]) Wound #9 Left Calcaneous o Clean wound with Normal Saline. o Cleanse wound with mild soap and water o May Shower, gently pat wound dry prior to applying new dressing. 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,Posterior Amputation Site - Below Knee o Topical Lidocaine 4% cream applied to wound bed prior to debridement Wound #18 Left Ischial Tuberosity o Topical Lidocaine 4% cream applied to wound bed prior to debridement Wound #20 Left,Dorsal Foot o Topical Lidocaine 4% cream applied to wound bed prior to debridement Wound #21 Left,Lateral Foot o Topical Lidocaine 4% cream applied to wound bed prior to debridement Wound #22 Right Ischial Tuberosity o Topical Lidocaine 4% cream applied to wound bed prior to debridement Wound #23 Right,Lateral Amputation Site - Above Knee o Topical Lidocaine 4% cream applied to wound bed prior to debridement Wound #24 Left,Distal Amputation Site - Transmetatarsal o Topical Lidocaine 4% cream applied to wound bed prior to debridement Wound #9 Left Calcaneous o Topical Lidocaine 4% cream applied to wound bed prior to debridement Primary Wound Dressing 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,Posterior Amputation Site - Below Knee o Boardered Foam Dressing - on R posterior BKA site wrap lightly with conform o Drawtex Wound #18 Left Ischial Tuberosity o Boardered Foam Dressing o  Drawtex Wound #20 Left,Dorsal Foot o Gauze, ABD and Kerlix/Conform Wound #21 Left,Lateral Foot o Gauze, ABD and Kerlix/Conform Wound #22 Right Ischial Tuberosity o Boardered Foam Dressing Wound #9 Left Calcaneous o Boardered Foam Dressing o Gauze, ABD and Kerlix/Conform Wound #23 Right,Lateral Amputation Site - Above Knee o Other - duoderm Wound #24 Left,Distal Amputation Site - Transmetatarsal o Gauze, ABD and Kerlix/Conform Dressing Change Frequency 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,Posterior Amputation  Site - Below Knee o Change Dressing Monday, Wednesday, Friday - and as needed for soilage - Mother to be educated to change dressings as needed for soilage Wound #18 Left Ischial Tuberosity o Change Dressing Monday, Wednesday, Friday - and as needed for soilage - Mother to be educated to change dressings as needed for soilage Wound #20 Left,Dorsal Foot o Change Dressing Monday, Wednesday, Friday - and as needed for soilage - Mother to be educated to change dressings as needed for soilage Wound #21 Left,Lateral Foot [place], [name] ([id number]) o Change Dressing Monday, Wednesday, Friday - and as needed for soilage - Mother to be educated to change dressings as needed for soilage Wound #22 Right Ischial Tuberosity o Change Dressing Monday, Wednesday, Friday - and as needed for soilage - Mother to be educated to change dressings as needed for soilage Wound #23 Right,Lateral Amputation Site - Above Knee o Change Dressing Monday, Wednesday, Friday - and as needed for soilage - Mother to be educated to change dressings as needed for soilage Wound #24 Left,Distal Amputation Site - Transmetatarsal o Change Dressing Monday, Wednesday, Friday - and as needed for soilage - Mother to be educated to change dressings as needed for soilage Wound #9 Left Calcaneous o Change Dressing Monday, Wednesday, Friday - and as needed for soilage - Mother to be educated to change  dressings as needed for soilage Follow-up Appointments 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 o Other: - as [name] is able Wound #18 Left Ischial Tuberosity o Return Appointment in 1 week. o Other: - as [name] is able Wound #20 Left,Dorsal Foot o Return Appointment in 1 week. o Other: - as [name] is able Wound #21 Left,Lateral Foot o Return Appointment in 1 week. o Other: - as [name] is able Wound #22 Right Ischial Tuberosity o Return Appointment in 1 week. o Other: - as [name] is able Wound #23 Right,Lateral Amputation Site - Above Knee o Return Appointment in 1 week. o Other: - as [name] is able Wound #24 Left,Distal Amputation Site - Transmetatarsal o Return Appointment in 1 week. o Other: - as [name] is able Wound #9 Left Calcaneous [name] [name], [name] ([id number]) o Return Appointment in 1 week. 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 Wound #18 Left Ischial Tuberosity o Increase protein intake. Wound #20 Left,Dorsal Foot o Increase protein intake. Wound #21 Left,Lateral Foot o Increase protein intake. Wound #22 Right Ischial Tuberosity o Increase protein intake. Wound #23 Right,Lateral Amputation Site - Above Knee o Increase protein intake. Wound #24 Left,Distal Amputation Site - Transmetatarsal o Increase protein intake. Wound #9 Left Calcaneous o Increase protein intake. 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 o FACE TO FACE ENCOUNTER: MEDICARE and MEDICAID PATIENTS: I certify that this [name] is under my care and  that I had [name] face-to-face encounter that meets the physician face-to-face encounter requirements with this [name] on this date. The encounter with the [name] was in whole or in part for the following [name] CONDITION: (primary reason for Home Healthcare) [name] NECESSITY: I certify, that based on my findings, NURSING services are [name] medically necessary home health  service. HOME BOUND STATUS: I certify that my clinical findings support that this [name] is homebound (i.e., Due to illness or injury, pt requires aid of supportive devices such as crutches, cane, wheelchairs, walkers, the use of special transportation or the assistance of another person to leave their place of residence. There is [name] normal inability to leave the home and doing so requires considerable and taxing effort. Other absences are for [name] reasons / religious services and are infrequent or of short duration when for other reasons). o If current dressing causes regression in wound condition, may D/C ordered dressing product/s and apply Normal Saline Moist Dressing daily until next Wound Healing Center / Other MD appointment. Notify Wound Healing Center of regression in wound condition at [phone]. CHERRISE, OCCHIPINTI ([id number]) o Please direct any NON-WOUND related issues/requests for orders to [name]'s Primary Care Physician Wound #18 Left Ischial Tuberosity o Continue Home Health Visits - Please give Marcie all the PRN visits that she needs - Also please have PT eval and treat as appropriate o Home Health Nurse may visit PRN to address patientos wound care needs. o FACE TO FACE ENCOUNTER: MEDICARE and MEDICAID PATIENTS: I certify that this [name] is under my care and that I had [name] face-to-face encounter that meets the physician face-to-face encounter requirements with this [name] on this date. The encounter with the [name] was in whole or in part for the following [name] CONDITION: (primary  reason for Home Healthcare) [name] NECESSITY: I certify, that based on my findings, NURSING services are [name] medically necessary home health service. HOME BOUND STATUS: I certify that my clinical findings support that this [name] is homebound (i.e., Due to illness or injury, pt requires aid of supportive devices such as crutches, cane, wheelchairs, walkers, the use of special transportation or the assistance of another person to leave their place of residence. There is [name] normal inability to leave the home and doing so requires considerable and taxing effort. Other absences are for [name] reasons / religious services and are infrequent or of short duration when for other reasons). o If current dressing causes regression in wound condition, may D/C ordered dressing product/s and apply Normal Saline Moist Dressing daily until next Wound Healing Center / Other MD appointment. Notify Wound Healing Center of regression in wound condition at [phone]. o Please direct any NON-WOUND related issues/requests for orders to [name]'s Primary Care Physician Wound #20 Left,Dorsal Foot o Continue Home Health Visits - Please give Jahmia all the PRN visits that she needs - Also please have PT eval and treat as appropriate o Home Health Nurse may visit PRN to address patientos wound care needs. o FACE TO FACE ENCOUNTER: MEDICARE and MEDICAID PATIENTS: I certify that this [name] is under my care and that I had [name] face-to-face encounter that meets the physician face-to-face encounter requirements with this [name] on this date. The encounter with the [name] was in whole or in part for the following [name] CONDITION: (primary reason for Home Healthcare) [name] NECESSITY: I certify, that based on my findings, NURSING services are [name] medically necessary home health service. HOME BOUND STATUS: I certify that my clinical findings support that this [name] is homebound (i.e., Due to illness or injury,  pt requires aid of supportive devices such as crutches, cane, wheelchairs, walkers, the use of special transportation or the assistance of another person to leave their place of residence. There is [name] normal inability to leave the home and doing so requires considerable and taxing effort. Other  absences are for [name] reasons / religious services and are infrequent or of short duration when for other reasons). o If current dressing causes regression in wound condition, may D/C ordered dressing product/s and apply Normal Saline Moist Dressing daily until next Wound Healing Center / Other MD appointment. Notify Wound Healing Center of regression in wound condition at [phone]. o Please direct any NON-WOUND related issues/requests for orders to [name]'s Primary Care Physician Wound #21 Left,Lateral Foot o Continue Home Health Visits - Please give Gwynneth all the PRN visits that she needs - Also please have PT eval and treat as appropriate SHENELL, ROGALSKI ([id number]) o Home Health Nurse may visit PRN to address patientos wound care needs. o FACE TO FACE ENCOUNTER: MEDICARE and MEDICAID PATIENTS: I certify that this [name] is under my care and that I had [name] face-to-face encounter that meets the physician face-to-face encounter requirements with this [name] on this date. The encounter with the [name] was in whole or in part for the following [name] CONDITION: (primary reason for Home Healthcare) [name] NECESSITY: I certify, that based on my findings, NURSING services are [name] medically necessary home health service. HOME BOUND STATUS: I certify that my clinical findings support that this [name] is homebound (i.e., Due to illness or injury, pt requires aid of supportive devices such as crutches, cane, wheelchairs, walkers, the use of special transportation or the assistance of another person to leave their place of residence. There is [name] normal inability to leave the home and  doing so requires considerable and taxing effort. Other absences are for [name] reasons / religious services and are infrequent or of short duration when for other reasons). o If current dressing causes regression in wound condition, may D/C ordered dressing product/s and apply Normal Saline Moist Dressing daily until next Wound Healing Center / Other MD appointment. Notify Wound Healing Center of regression in wound condition at [phone]. o Please direct any NON-WOUND related issues/requests for orders to [name]'s Primary Care Physician Wound #22 Right Ischial Tuberosity o Continue Home Health Visits - Please give Janai all the PRN visits that she needs - Also please have PT eval and treat as appropriate o Home Health Nurse may visit PRN to address patientos wound care needs. o FACE TO FACE ENCOUNTER: MEDICARE and MEDICAID PATIENTS: I certify that this [name] is under my care and that I had [name] face-to-face encounter that meets the physician face-to-face encounter requirements with this [name] on this date. The encounter with the [name] was in whole or in part for the following [name] CONDITION: (primary reason for Home Healthcare) [name] NECESSITY: I certify, that based on my findings, NURSING services are [name] medically necessary home health service. HOME BOUND STATUS: I certify that my clinical findings support that this [name] is homebound (i.e., Due to illness or injury, pt requires aid of supportive devices such as crutches, cane, wheelchairs, walkers, the use of special transportation or the assistance of another person to leave their place of residence. There is [name] normal inability to leave the home and doing so requires considerable and taxing effort. Other absences are for [name] reasons / religious services and are infrequent or of short duration when for other reasons). o If current dressing causes regression in wound condition, may D/C ordered dressing  product/s and apply Normal Saline Moist Dressing daily until next Wound Healing Center / Other MD appointment. Notify Wound Healing Center of regression in wound condition at [phone]. o Please direct any NON-WOUND related issues/requests for orders  to [name]'s Primary Care Physician Wound #23 Right,Lateral Amputation Site - Above Knee o Continue Home Health Visits - Please give Krystin all the PRN visits that she needs - Also please have PT eval and treat as appropriate o Home Health Nurse may visit PRN to address patientos wound care needs. o FACE TO FACE ENCOUNTER: MEDICARE and MEDICAID PATIENTS: I certify that this [name] is under my care and that I had [name] face-to-face encounter that meets the physician face-to-face encounter requirements with this [name] on this date. The encounter with the [name] was in whole or in part for the following [name] CONDITION: (primary reason for Home Healthcare) [name] NECESSITY: I certify, that based on my findings, NURSING services are [name] medically CLARINDA, OBI. ([id number]) necessary home health service. HOME BOUND STATUS: I certify that my clinical findings support that this [name] is homebound (i.e., Due to illness or injury, pt requires aid of supportive devices such as crutches, cane, wheelchairs, walkers, the use of special transportation or the assistance of another person to leave their place of residence. There is [name] normal inability to leave the home and doing so requires considerable and taxing effort. Other absences are for [name] reasons / religious services and are infrequent or of short duration when for other reasons). o If current dressing causes regression in wound condition, may D/C ordered dressing product/s and apply Normal Saline Moist Dressing daily until next Wound Healing Center / Other MD appointment. Notify Wound Healing Center of regression in wound condition at [phone]. o Please direct any  NON-WOUND related issues/requests for orders to [name]'s Primary Care Physician Wound #24 Left,Distal Amputation Site - Transmetatarsal o Continue Home Health Visits - Please give Cherrie all the PRN visits that she needs - Also please have PT eval and treat as appropriate o Home Health Nurse may visit PRN to address patientos wound care needs. o FACE TO FACE ENCOUNTER: MEDICARE and MEDICAID PATIENTS: I certify that this [name] is under my care and that I had [name] face-to-face encounter that meets the physician face-to-face encounter requirements with this [name] on this date. The encounter with the [name] was in whole or in part for the following [name] CONDITION: (primary reason for Home Healthcare) [name] NECESSITY: I certify, that based on my findings, NURSING services are [name] medically necessary home health service. HOME BOUND STATUS: I certify that my clinical findings support that this [name] is homebound (i.e., Due to illness or injury, pt requires aid of supportive devices such as crutches, cane, wheelchairs, walkers, the use of special transportation or the assistance of another person to leave their place of residence. There is [name] normal inability to leave the home and doing so requires considerable and taxing effort. Other absences are for [name] reasons / religious services and are infrequent or of short duration when for other reasons). o If current dressing causes regression in wound condition, may D/C ordered dressing product/s and apply Normal Saline Moist Dressing daily until next Wound Healing Center / Other MD appointment. Notify Wound Healing Center of regression in wound condition at [phone]. o Please direct any NON-WOUND related issues/requests for orders to [name]'s Primary Care Physician Wound #9 Left Calcaneous o Continue Home Health Visits - Please give Andrea all the PRN visits that she needs - Also please have PT eval and treat as  appropriate o Home Health Nurse may visit PRN to address patientos wound care needs. o FACE TO FACE ENCOUNTER: MEDICARE and MEDICAID PATIENTS: I certify that this [name] is  under my care and that I had [name] face-to-face encounter that meets the physician face-to-face encounter requirements with this [name] on this date. The encounter with the [name] was in whole or in part for the following [name] CONDITION: (primary reason for Home Healthcare) [name] NECESSITY: I certify, that based on my findings, NURSING services are [name] medically necessary home health service. HOME BOUND STATUS: I certify that my clinical findings support that this [name] is homebound (i.e., Due to illness or injury, pt requires aid of supportive devices such as crutches, cane, wheelchairs, walkers, the use of special transportation or the assistance of another person to leave their place of residence. There is [name] normal inability to leave the home and doing so requires considerable and taxing effort. KEARIA, YIN ([id number]) absences are for [name] reasons / religious services and are infrequent or of short duration when for other reasons). o If current dressing causes regression in wound condition, may D/C ordered dressing product/s and apply Normal Saline Moist Dressing daily until next Wound Healing Center / Other MD appointment. Notify Wound Healing Center of regression in wound condition at [phone]. o Please direct any NON-WOUND related issues/requests for orders to [name]'s Primary Care Physician Electronic Signature(s) Signed: 11/18/2016 4:59:22 PM By: [name] Najjar MD Signed: 11/18/2016 5:23:30 PM By: [name] [name] Entered By: [name] [name] on 11/18/2016 10:39:23 [name] [name] ([id number]) -------------------------------------------------------------------------------- Problem List Details [name] [name] Date of Service: 11/18/2016 9:45 AM [name] Name: [name] [name]  Account Number: [id number] [name] Record Treating RN: [name] [name] [id number] Number: Other Clinician: 12-28-1980 (35 y.o. 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 ([id number]) L89.899 Pressure ulcer of other site, unspecified stage 08/06/2016 Yes L89.223 Pressure ulcer of left hip, stage 3 08/05/2016 Yes Inactive Problems Resolved Problems ICD-10 Code Description Active Date Resolved Date L02.611 Cutaneous abscess of right foot 10/01/2015 10/01/2015 Electronic Signature(s) Signed: 11/18/2016 4:59:22 PM By: [name] Najjar MD Entered By: [name] [name] on 11/18/2016 12:47:28 [name] [name] ([id number]) -------------------------------------------------------------------------------- Progress Note Details [name] [name] Date of Service: 11/18/2016 9:45 AM [name] Name: [name] [name] Account Number: [id number] [name] Record Treating RN: [name] [name] [id number] Number: Other Clinician: [birth year] (35 y.o. Treating Mylisa Brunson Date of Birth/Sex: Female)  Provider/Extender: G Primary Care Provider: Rolin [name] Referring Provider: [name] [name] in Treatment: 59 Subjective Chief Complaint Information obtained from Patient Ms. Hoffmann returns for evaluation of multiple ulcerations History of Present Illness (HPI) The following HPI elements were documented for the [name]'s wound: Location: dry gangrene both feet and heels Quality: [name] reports No Pain. Severity: [name] states wound are getting worse. Duration: [name] has had the wound for > 4months prior to seeking treatment at the wound center Context: The wound appeared gradually over time Modifying Factors: she has been  in and out of [name] over the last 2 months Associated Signs and Symptoms: [name] reports having difficulty standing for long periods. Imani Fiebelkorn is [name] 36 y.o. female who presents to our wound center, back in June 2016, referred by her PCP [name] [name] for nonhealing ulcers on the lateral aspect of the right heel. Of note she has [name] history of type 1 diabetes mellitus that has been uncontrolled. Past [name] history significant for type 1 diabetes mellitus not controlled, ankylosing spondylitis, anorexia nervosa, irritable bowel syndrome, chronic kidney disease, chronic diarrhea. she then developed gangrene of both feet due to severe peripheral vascular disease and also had gangrene of the tips of her fingers due to upper extremity vascular disease. She was being worked up by vascular surgery at [facility] and at [name] and has had several procedures done there. She started with hyperbaric oxygen therapy and had [name] total of 40 treatments the last one being on 06/20/2015. After the initial treatment of hyperbaric oxygen therapy she started having ear problems and had ultimately to use myringotomy tubes and this was done bilaterally. Since then her ears have been doing fine. In late September, she had seen vascular and hand surgeons. since then [facility]'s been [facility]  [place] [facility] involving extensive vascular procedures for the upper extremities. She was then at [name] with GI bleeds both upper and lower and has been in and out of [name] for that. She has recently been out of [name] for the last week. 09/09/2015 -- she was unable to get here in time to start her hyperbaric oxygen today and hence is only here for [name] wound care visit. 09/19/2015 -- she has been having vancomycin during her dialysis and continues to have vascular appointments and the procedure is been set for early January. She has been unable to make it for her hemodialysis due to various [name] symptoms. JOSSIE, SMOOT ([id number]) 09/30/2014 -- her vancomycin was stopped on 09/25/2015 and the mother has noticed the right foot has started draining for the last 3 days. Addendum: after examining the [name] I was able to talk to her primary vascular surgeon [name] at the [name] I told him about the necrotic area on the plantar aspect of right foot which is now wet gangrene and he agreed with me that he would admit her at [name] under her care and synchronize further treatment. We have discussed her poor prognosis and he and I discussed the need for hospice care and for sitting down and talking to the [name] and her mother and giving them [name] proper detail of the prognosis. 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 She was worked up with MRIs, abdomen aortogram and placement of left-sided angioplasties were done. After [name] prolonged [name] she she  was discharged home and was told to wear shrinker sock and stump protector and see her surgeon for further instructions regarding wound care and suture removal. Asked to take long-term doxycycline. 01/15/16; this is [name] [name] I haven't seen before although she is been followed by Dr. Meyer [name] in this clinic today. She is [name] type I diabetic with severe  PAD macrovascular disease. She has had [name] previous BKA. She has dry gangrene of the tips of her fingers which she showed me on the right to. She also has dry gangrene of the left first second and third toes and [name] portion of her proximal foot around these areas. She is followed by vascular surgery at [name] and saw them recently they are not going to do surgery as of yet. She has [name] large black eschar over her heel which is beginning to separate in some areas. As I understand think she is paining these with Betadine. There is been some suggestion about retrying hyperbarics on her although she is still not able to commit to the frequency of treatment that would be necessary to see improvement. She is also on Monday Wednesday Friday dialysis 01/21/16; the [name] returns to see me today with regards to the left heel. Apparently she is not scheduled for any further attempts at revascularization of the left foot. 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 01/28/16; the [name] returns today for attention to the left foot specifically the left calcaneal ulcer. This is covered in [name] thick black eschar. I crosshatched this last week and we have been using Santyl. 02/05/16; I continue to work on the thick black eschar on the [name]'s left calcaneus. She is using Santyl that this side crosshatched this area and the eschar is beginning to loosen. She has dry gangrene involving [name] large area of the medial aspect of her foot extending into the first metatarsal head and involving the totality of her  first and second toes. This is beginning to separate and liquefy as well especially between the first and second toes. In the time being her major complaint is fatigue at dialysis 02/26/16 I continue to work on the [name]'s left calcaneus thick adherent black eschar. I crosshatched this area and we have been using Santyl although it is very adherent area I remove some nonviable tissue today what I can see of this actually looks surprisingly good. On the same foot she has dry gangrene on the first and second toes and part of the forefoot underneath this. This is beginning to separate. 03/11/16; the [name] comes in for her every 2 week appointment. I have been working on the black eschar on her heel. The [name] apparently again has to come off dialysis early today after 2 hours due to severe complaints of nausea. She really does not look well. 04/02/2016 -- the [name] has not been seen here for about 3 weeks now and has [name] new issue with the stump of the right amputation site and also her right posterior thigh. They have only noticed this for the last couple of days. 04/28/16; I had received [name] call from the [name]'s surgeon at [name]. She had [name] left transmetatarsal amputation and Integra applied to the left heel. Both of these areas appear to be doing well. There are dressing these with [name] and they will return to their surgeon on Thursday. She has [name] new injury on the popliteal Falconi, Florina N. ([id number]) fossa on the right which I think was trauma from her stump. 05/20/16; the [name] is following with her surgeon at [name]. She's had [name] left transmetatarsal debridement of the left heel she had Integra place and apparently is using some consternation of Epson salts soaks, Betadine and Aquacel Ag. The left leg is wrapped I did not look at this  today. She has the wound in her right popliteal fossa which is apparently [name] pressure area possibly related to sitting on [name] toilet for 2-3  hours multiple times [name] day. 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 We have been using Santyl to the area on the right popliteal fossa. She has [name] new wound on her right gluteal area but the [name] would not let me look at that today 06/03/16; [name] is not doing particularly well. She now has [name] pressure area over her left ischial tuberosity. This is of quite [name] size and covered with an adherent surface slough. She looks as though she has lost weight, I didn't want to go ahead and attempt to debridement this today. There is really no evidence of infection. The area behind the right popliteal fossa looks about the same as 2 weeks ago we have been using Santyl to this area. I did not look at her left foot which is wrapped been followed by podiatry at [name] 06/10/16; [name] arrives in clinic actually looking [name] lot brighter than I usually see her, predictably she did not go to dialysis today. For the first time in perhaps 6 weeks I actually saw her left foot today. The transmetatarsal site is healed. The left heel has [name] reasonably stable-looking wound which has [name] clean base. Some eschar superiorly and [name] few sutures remain in place. More worrisome is an area on her lateral left foot over the metatarsal head. Quarter size necrotic wound that probes to bone. There is some purulence here which I cultured there is nothing that looks like [name] healthy base of this area. They've been using silver alginate to this area at home. She also has [name] large wound in the right popliteal fossa, and again [name] pressure area over the left ischial tuberosity. We are using Santyl to both of these areas. Both allays looks somewhat better than last week, using Santyl to both areas 06/17/16: culture from last week grew citrobacter and amp sensitive enterococcus fecalis. Started on vanc last Saturday at dialysis. have spoken to dialysis in [place] re adjustment  in antibiotics added ceftazidine to vanc 06/24/16; the [name] was discharged from [name] yesterday. She had an IandD of the open area on the lateral aspect of her foot. She continues on vancomycin and Ceptaz again at dialysis although I'm not exactly sure of the current duration of this. She also had [name] debridement of the wound over the left greater trochanter done by the wound care team. She is receiving Santyl based dressings to this and the area in her right popliteal fossa. She arrives today completely fatigued from dialysis. I spoke to Dr. Allena Katz her podiatrist and surgeon at [name] and asked if we could manage the wound VAC which I think we can. He also wanted to ask about hyperbaric oxygen with the indication of chronic osteomyelitis although at this point I am not completely certain where the chronic osteomyelitis is. Finally she apparently has had [name] re-graft to the area on her left heel which is either [name] skin graft or Integra 07/15/16; [name] arrives today she is not been in the clinic since I saw her on 9/27. She's been using Santyl to the left greater trochanter, I popliteal fossa. Her home health nurse remove the wound VAC from the transmetatarsal head, there is only [name] small wound on the medial aspect of the foot. Dr. Allena Katz who is been managing the heel wounds has  requested Xeroform to the heel. 07/22/16; [name] arrives as usual postdialysis she is very fatigued and weak looking. She generally tolerates dialysis poorly requiring midodrine tosupport her blood pressure. She continues on vancomycin and I believe ceftazadine at dialysis for chronic osteomyelitis She has [name] wound on the lateral left transmetatarsal amputation. This was debrided of surface slough nonviable subcutaneous tissue the base of this looks healthy and improved. Her left heel wound is followed by by Dr. Allena Katz at [name] her surgeon. We have been applying Xeroform to this at his request, he'll She has [name] deep wound  over her left greater trochanter however this does not appear to be infected has healthy granulation and I think could be well served by [name] wound VAC with collagen under the foam Under the right popliteal fossa there is [name] pressure area apparently from [name] toilet seat. This has been making progress again the tissue here appears to be healthy we have been using silver collagen in this area as well There is [name] new small area on the lateral aspect of her right stump which appears to be draining purulent [name] [name], [name] ([id number]) material. Our nurses obtained [name] specimen of this for culture. As noted she is already onVAncomycin e [name] third generation cephalosporin 08/05/16 -Ms. Vo arrives today accompanied by her aunt and cousin, she did not receive dialysis today due to "feeling bad" and is scheduled to received dialysis tomorrow. She has completed vancomycin that she was receiving with dialysis. She has an appointment with podiatry on Friday; admits to using right prosthesis for transfers primarily -She has [name] wound on the lateral left TMA site, essentially unchanged and stable; staples remain in medial aspect of healed surgical site from August, [name] states that the plan is to remove staples on Friday -Left heel ulcer appears stable with granulation tissue -stage III left trochanter pressure ulcer with peri-wound irritation, appears to be from leakage despite [name] stating that VAC has not leaked, has been using Prisma and wound VAC -Right popliteal fossa pressure ulcer is stable -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 and pale erythema to lateral aspect, improved from last week, although purulence able to be expressed, will encourage warm compresses to aide in bring fluid to surface - new areas of DTI to left foot dorsal and lateral aspect; [name] admits that foot has been wrapped with ace per podiatry, she also wears surgical shoe to left foot; dorsal DTI has intact blister;  dorsal DTI not present at last appointment, lateral DTI present but significantly smaller at last appointment; both areas (dorsal>lateral) concerning for threatening limb loss, new to size and location - she has seen "limb loss specialist" at [name] in the past and was encouraged to follow up with that provider in presence of new development 09/08/16 I have not seen this [name] in quite some time perhaps late October. She was seen once here at the beginning of November however her appointments are scheduled after dialysis at which time she doesn't feel well enough to come. Also noted recently our staff of taken several phone calls from her home health nurse that been concerned about worsening wounds on the back of the right knee left greater trochanter. We brought her in today to go over this. As far as I can tell the wounds are as follows; #1 pressure ulcer on the back of her right knee in the popliteal fossa. As best I can tell this is caused by prolonged sitting on the toilet seat related to  ongoing issues with profuse, high-volume diarrhea #2 pressure ulcer over the left greater trochanter. We have been using [name] wound VAC on this area #3 probing ulcer on the medial aspect of her left transmetatarsal amputation #4 pressure ulcer on the left posterior calcaneus #5 excoriation on the lateral aspect of her left foot which is not clearly open but was concerning last time she was here for [name] deep tissue injury. She is off antibiotics at dialysis since the last time I saw her. This was for underlying osteomyelitis. 09/16/16; she arrives today with the pressure ulcer on the back of her right knee still draining and looking somewhat worse. Culture I did last week grew Klebsiella and enterococcus which is ampicillin sensitive. I therefore have given her prescription for Augmentin 500/125 once [name] day post dialysis on dialysis days. The other concerning thing today is the ischemic-looking areas on the  lateral left foot and the left anterior foot. She was supposed to see [name] at [name] today but they did not make it there. 09/30/16; since the [name] was last seen here on 12/20 she has been to see [name] at [name] and apparently he revascularized her left foot although I don't have any of these details. We are concerned last time with ischemic-looking wounds on the left heel left lateral foot. As well she is completed her antibiotics for the drainage coming out of the area on the right popliteal fossa and this looks somewhat better today. Finally she wants to have the VAC discontinued for [name] period of time stating that she finds it irritating. To be truthful the circumference of the wound over the left buttock seems better but the depth per our intake nurse today is actually worse making it somewhat difficult to argue with her [name], [name] ([id number]) 11/11/16; the [name] arrives back in clinic today not looking very well at all. Firstly she is almost immobilized by poorly defined pain including the right leg greater than the left. Upper arms. She did not complain of back pain. We could not really move her very well in her chair because of pain. She had [name] recent revascularization of the right BKA leg by [name] at [name]. I can see his admission note but I can't really see what he did. Also when she was here the last time I did [name] culture of the area behind her right popliteal fossa and gave her Augmentin. This was supposed to be after dialysis days for 5 treatments i.e. roughly 10 days however she took them on each day rather than on dialysis days however that would've been 5-6 weeks ago his abdomen that long since we have seen her. She has home health coming out I think they've been using Iodoflex the most of these wound areas. As mentioned she is revascularized on the right. Our intake nurses noted [name] small draining sinus and they obtained [name] culture from this area which we sent off this  is right on the tip of her amputation site on the right 11/18/16; [name] arrives this morning. I was called yesterday to report [name] erythematous area on the posterior right thigh in the midline by the [name]'s home health nurse. I was emailed some pictures of this which looked to be cellulitis although the [name] was not tender per report. I called her in Levaquin adjusted for her dialysis/renal function. She arrives today in follow-up. She is not systemically unwell however the area clearly has subcutaneous crepitus and tenderness of the underlying quadriceps  muscle. I think this requires hospitalization. She also has cultures that we did last week coming out of tip of her stump showing both Proteus and [place]. The candidate would be an unusual true infection. Culture I did of the large wound over the popliteal fossa has exposed tendon but did not culture Objective Constitutional Sitting or standing Blood Pressure is within target range for [name]. Pulse regular and within target range for [name].Marland Kitchen Respirations regular, non-labored and within target range.. Temperature is normal and within the target range for the [name]. [name]'s appearance is neat and clean. Appears in no acute distress. Well nourished and well developed.. Vitals Time Taken: 9:47 AM, Height: 68 in, Weight: 96 lbs, BMI: 14.6, Temperature: 98.1 F, Pulse: 91 bpm, Respiratory Rate: 16 breaths/min, Blood Pressure: 109/47 mmHg. Respiratory Respiratory effort is easy and symmetric bilaterally. Rate is normal at rest and on room air.. Cardiovascular Heart rhythm and rate regular, without murmur or gallop.. Gastrointestinal (GI) Abdomen is soft and non-distended without masses or tenderness. Bowel sounds active in all quadrants.. Lymphatic Nonpalpable in the right popliteal or inguinal area.Marland Kitchen URANIA, [name] ([id number]) General Notes: Wound exam; the [name]'s 2 wounds over the right initial tuberosity and the left  greater trochanter actually looked better. Large wound over the right popliteal fossa with exposed flexor tendon this is not overtly infected Wounds on the left foot including [name] new open area in the plantar midline of her transmetatarsal Integumentary (Hair, Skin) Most worrisome thing about her exam today was this subcutaneous crepitus under the erythema on the posterior thigh. I felt her entire quadricep muscle was tender.. Wound #16 status is Open. Original cause of wound was Pressure Injury. 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 The wound measures 4.1cm length x 6.6cm width x 1.1cm depth; 21.253cm^2 area and 23.378cm^3 volume. There is tendon and Fat Layer (Subcutaneous Tissue) Exposed exposed. There is no tunneling noted. There is [name] large amount of sanguinous drainage noted. The wound margin is distinct with the outline attached to the wound base. There is medium (34-66%) pink granulation within the wound bed. There is [name] medium (34-66%) amount of necrotic tissue within the wound bed including Eschar and Adherent Slough. The periwound skin appearance exhibited: Excoriation, Induration, Erythema. The periwound skin appearance did not exhibit: Callus, Crepitus, Rash, Scarring, Dry/Scaly, Maceration, Atrophie Blanche, Cyanosis, Ecchymosis, Hemosiderin Staining, Mottled, Pallor, Rubor. The surrounding wound skin color is noted with erythema which is circumferential. Periwound temperature was noted as No Abnormality. The periwound has tenderness on palpation. Wound #18 status is Open. Original cause of wound was Pressure Injury. The wound is located on the Left Ischial Tuberosity. The wound measures 3.6cm length x 2.3cm width x 0.2cm depth; 6.503cm^2 area and 1.301cm^3 volume. There is Fat Layer (Subcutaneous Tissue) Exposed exposed. There is no tunneling or undermining noted. There is [name] large amount of serosanguineous drainage noted. The wound margin is  flat and intact. There is no granulation within the wound bed. There is [name] large (67-100%) amount of necrotic tissue within the wound bed including Adherent Slough. The periwound skin appearance exhibited: Excoriation, Erythema. The periwound skin appearance did not exhibit: Callus, Crepitus, Induration, Rash, Scarring, Dry/Scaly, Maceration, Atrophie Blanche, Cyanosis, Ecchymosis, Hemosiderin Staining, Mottled, Pallor, Rubor. The surrounding wound skin color is noted with erythema which is circumferential. Wound #20 status is Open. Original cause of wound was Pressure Injury. The wound is located on the Left,Dorsal Foot. The wound measures 3cm length x 2.7cm width x  0.2cm depth; 6.362cm^2 area and 1.272cm^3 volume. The wound is limited to skin breakdown. There is no tunneling or undermining noted. There is [name] medium amount of serous drainage noted. The wound margin is flat and intact. There is small (1-33%) pink granulation within the wound bed. There is [name] medium (34-66%) amount of necrotic tissue within the wound bed including Eschar and Adherent Slough. The periwound skin appearance exhibited: Erythema. The periwound skin appearance did not exhibit: Callus, Crepitus, Excoriation, Induration, Rash, Scarring, Dry/Scaly, Maceration, Atrophie Blanche, Cyanosis, Ecchymosis, Hemosiderin Staining, Mottled, Pallor, Rubor. The surrounding wound skin color is noted with erythema which is circumferential. Wound #21 status is Open. Original cause of wound was Pressure Injury. The wound is located on the Left,Lateral Foot. The wound measures 0.9cm length x 1.4cm width x 0.2cm depth; 0.99cm^2 area and 0.198cm^3 volume. The wound is limited to skin breakdown. There is no tunneling or undermining noted. There is [name] small amount of serous drainage noted. The wound margin is flat and intact. There is no granulation within the wound bed. There is [name] large (67-100%) amount of necrotic tissue within the wound bed  including Eschar and Adherent Slough. The periwound skin appearance exhibited: Induration, Ecchymosis. The periwound skin appearance did not exhibit: Callus, Crepitus, Excoriation, Rash, Scarring, Dry/Scaly, Maceration, Atrophie Blanche, Cyanosis, Hemosiderin Staining, Mottled, Pallor, Rubor, Preiss, Charlesetta N. ([id number]) Erythema. Wound #22 status is Open. Original cause of wound was Pressure Injury. The wound is located on the Right Ischial Tuberosity. The wound measures 1cm length x 1.2cm width x 0.1cm depth; 0.942cm^2 area and 0.094cm^3 volume. Wound #23 status is Open. Original cause of wound was Pimple. The wound is located on the Right,Lateral Amputation Site - Above Knee. The wound measures 0.5cm length x 0.2cm width x 0.2cm depth; 0.079cm^2 area and 0.016cm^3 volume. The wound is limited to skin breakdown. There is no tunneling or undermining noted. There is [name] large amount of purulent drainage noted. The wound margin is flat and intact. There is large (67-100%) granulation within the wound bed. There is no necrotic tissue within the wound bed. The periwound skin appearance did not exhibit: Callus, Crepitus, Excoriation, Induration, Rash, Scarring, Dry/Scaly, Maceration, Atrophie Blanche, Cyanosis, Ecchymosis, Hemosiderin Staining, Mottled, Pallor, Rubor, Erythema. Periwound temperature was noted as No Abnormality. The periwound has tenderness on palpation. Wound #24 status is Open. Original cause of wound was Gradually Appeared. The wound is located on the Left,Distal Amputation Site - Transmetatarsal. The wound measures 1cm length x 1.7cm width x 0.6cm depth; 1.335cm^2 area and 0.801cm^3 volume. There is Fat Layer (Subcutaneous Tissue) Exposed exposed. There is no tunneling or undermining noted. There is [name] medium amount of sanguinous drainage noted. The wound margin is indistinct and nonvisible. There is no granulation within the wound bed. There is [name] large (67-100%) amount of  necrotic tissue within the wound bed including Eschar and Adherent Slough. The periwound skin appearance exhibited: Maceration. Wound #9 status is Open. Original cause of wound was Pressure Injury. The wound is located on the Left Calcaneous. The wound measures 4.5cm length x 2.8cm width x 0.2cm depth; 9.896cm^2 area and 1.979cm^3 volume. There is Fat Layer (Subcutaneous Tissue) Exposed exposed. There is no tunneling or undermining noted. There is [name] large amount of serosanguineous drainage noted. The wound margin is flat and intact. There is no granulation within the wound bed. There is [name] large (67-100%) amount of necrotic tissue within the wound bed. The periwound skin appearance did not exhibit: Callus, Crepitus, Excoriation, Induration, Rash, Scarring,  Dry/Scaly, Maceration, Atrophie Blanche, Cyanosis, Ecchymosis, Hemosiderin Staining, Mottled, Pallor, Rubor, Erythema. 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 Cleanse wound with mild soap and water May Shower, gently pat wound dry prior to applying new dressing. Wound #18 Left Ischial Tuberosity: Clean wound with  Normal Saline. Cleanse wound with mild soap and water May Shower, gently pat wound dry prior to applying new dressing. Wound #20 Left,Dorsal Foot: Clean wound with Normal Saline. Cleanse wound with mild soap and water May Shower, gently pat wound dry prior to applying new dressing. Wound #21 Left,Lateral Foot: Clean wound with Normal Saline. Cleanse wound with mild soap and water May Shower, gently pat wound dry prior to applying new dressing. Wound #22 Right Ischial Tuberosity: Clean wound with Normal Saline. Cleanse wound with mild soap and water May Shower, gently pat wound dry prior to applying new dressing. Wound #23 Right,Lateral Amputation Site - Above Knee: Clean wound with Normal Saline. Cleanse wound with mild soap and water May Shower, gently pat wound dry prior to applying new dressing. Wound #24 Left,Distal Amputation Site - Transmetatarsal: Clean wound with Normal Saline. Cleanse wound with mild soap and water May Shower, gently pat wound dry prior to applying new dressing. Wound #9 Left Calcaneous: Clean wound with Normal Saline. Cleanse wound with mild soap and water May Shower, gently pat wound dry prior to applying new dressing. 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,Posterior Amputation Site - Below Knee: Aquacel Ag Wound #18 Left Ischial Tuberosity: Aquacel Ag Wound #20 Left,Dorsal Foot: Aquacel Ag Wound #21 Left,Lateral Foot: Aquacel Ag Wound #22 Right Ischial Tuberosity: Aquacel Ag Wound #23 Right,Lateral Amputation Site - Above Knee: Aquacel Ag Wound #24 Left,Distal Amputation Site - Transmetatarsal: Aquacel Ag Wound #9 Left Calcaneous: Aquacel Ag Secondary Dressing: Wound #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,Posterior Amputation Site - Below Knee: Boardered Foam Dressing - on R posterior BKA site wrap lightly with conform Drawtex Wound #18 Left Ischial Tuberosity: Boardered Foam Dressing  Drawtex Wound #20 Left,Dorsal Foot: Gauze, ABD and Kerlix/Conform Wound #21 Left,Lateral Foot: Gauze, ABD and Kerlix/Conform Wound #22 Right Ischial Tuberosity: Boardered Foam Dressing Wound #9 Left Calcaneous: Boardered Foam Dressing Gauze, ABD and Kerlix/Conform Wound #23 Right,Lateral Amputation Site - Above Knee: Other - duoderm Wound #24 Left,Distal Amputation Site - Transmetatarsal: Gauze, ABD and Kerlix/Conform Dressing Change Frequency: Wound #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,Posterior Amputation Site - Below Knee: Change Dressing Monday, Wednesday, Friday - and as needed for soilage - Mother to be educated to change dressings as needed for soilage CHRISTYANN, MANOLIS ([id number]) Wound #18 Left Ischial Tuberosity: Change Dressing Monday, Wednesday, Friday - and as needed for soilage - Mother to be educated to change dressings as needed for soilage Wound #20 Left,Dorsal Foot: Change Dressing Monday, Wednesday, Friday - and as needed for soilage - Mother to be educated to change dressings as needed for soilage Wound #21 Left,Lateral Foot: Change Dressing Monday,  Wednesday, Friday - and as needed for soilage - Mother to be educated to change dressings as needed for soilage Wound #22 Right Ischial Tuberosity: Change Dressing Monday, Wednesday, Friday - and as needed for soilage - Mother to be educated to change dressings as needed for soilage Wound #23 Right,Lateral Amputation Site - Above Knee: Change Dressing Monday, Wednesday, Friday - and as needed for soilage - Mother to be educated to change dressings as needed for soilage Wound #24 Left,Distal Amputation Site - Transmetatarsal: Change Dressing Monday, Wednesday, Friday - and as needed for soilage - Mother to be educated to change dressings as needed for soilage Wound #9 Left Calcaneous: Change Dressing Monday, Wednesday, Friday - and as needed for soilage - Mother to be educated to change dressings as needed for soilage Follow-up Appointments: 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 Other: - as [name] is able Wound #18 Left Ischial Tuberosity: Return Appointment in 1 week. Other: - as [name] is able Wound #20 Left,Dorsal Foot: Return Appointment in 1 week. Other: - as [name] is able Wound #21 Left,Lateral Foot: Return Appointment in 1 week. Other: - as [name] is able Wound #22 Right Ischial Tuberosity: Return Appointment in 1 week. Other: - as [name] is able Wound #23 Right,Lateral Amputation Site - Above Knee: Return Appointment in 1 week. Other: - as [name] is able Wound #24 Left,Distal Amputation Site - Transmetatarsal: Return Appointment in 1 week. Other: - as [name] is able Wound #9 Left Calcaneous: Return Appointment in 1 week. 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 Wound #18 Left Ischial Tuberosity: Increase protein intake. MALAINA, MORTELLARO ([id number]) Wound #20 Left,Dorsal Foot: Increase protein  intake. Wound #21 Left,Lateral Foot: Increase protein intake. Wound #22 Right Ischial Tuberosity: Increase protein intake. Wound #23 Right,Lateral Amputation Site - Above Knee: Increase protein intake. Wound #24 Left,Distal Amputation Site - Transmetatarsal: Increase protein intake. Wound #9 Left Calcaneous: Increase protein intake. 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 FACE TO FACE ENCOUNTER: MEDICARE and MEDICAID PATIENTS: I certify that this [name] is under my care and that I had [name] face-to-face encounter that meets the physician face-to-face encounter requirements with this [name] on this date. The encounter with the [name] was in  whole or in part for the following [name] CONDITION: (primary reason for Home Healthcare) [name] NECESSITY: I certify, that based on my findings, NURSING services are [name] medically necessary home health service. HOME BOUND STATUS: I certify that my clinical findings support that this [name] is homebound (i.e., Due to illness or injury, pt requires aid of supportive devices such as crutches, cane, wheelchairs, walkers, the use of special transportation or the assistance of another person to leave their place of residence. There is [name] normal inability to leave the home and doing so requires considerable and taxing effort. Other absences are for [name] reasons / religious services and are infrequent or of short duration when for other reasons). If current dressing causes regression in wound condition, may D/C ordered dressing product/s and apply Normal Saline Moist Dressing daily until next Wound Healing Center / Other MD appointment. Notify Wound Healing Center of regression in wound condition at [phone]. Please  direct any NON-WOUND related issues/requests for orders to [name]'s Primary Care Physician Wound #18 Left Ischial Tuberosity: Continue Home Health Visits - Please give Donise all the PRN visits that she needs - Also please have PT eval and treat as appropriate Home Health Nurse may visit PRN to address [name] s wound care needs. FACE TO FACE ENCOUNTER: MEDICARE and MEDICAID PATIENTS: I certify that this [name] is under my care and that I had [name] face-to-face encounter that meets the physician face-to-face encounter requirements with this [name] on this date. The encounter with the [name] was in whole or in part for the following [name] CONDITION: (primary reason for Home Healthcare) [name] NECESSITY: I certify, that based on my findings, NURSING services are [name] medically necessary home health service. HOME BOUND STATUS: I certify that my clinical findings support that this [name] is homebound (i.e., Due to illness or injury, pt requires aid of supportive devices such as crutches, cane, wheelchairs, walkers, the use of special transportation or the assistance of another person to leave their place of residence. There is [name] normal inability to leave the home and doing so requires considerable and taxing effort. Other absences are for [name] reasons / religious services and are infrequent or of short duration when for other reasons). If current dressing causes regression in wound condition, may D/C ordered dressing product/s and apply Normal Saline Moist Dressing daily until next Wound Healing Center / Other MD appointment. Notify Wound Healing Center of regression in wound condition at [phone]. Please direct any NON-WOUND related issues/requests for orders to [name]'s Primary Care Physician Wound #20 Left,Dorsal Foot: Continue Home Health Visits - Please give Josilyn all the PRN visits that she needs - Also please have Soderholm, Kaniah N. ([id number]) PT eval and treat as  appropriate Home Health Nurse may visit PRN to address [name] s wound care needs. FACE TO FACE ENCOUNTER: MEDICARE and MEDICAID PATIENTS: I certify that this [name] is under my care and that I had [name] face-to-face encounter that meets the physician face-to-face encounter requirements with this [name] on this date. The encounter with the [name] was in whole or in part for the following [name] CONDITION: (primary reason for Home Healthcare) [name] NECESSITY: I certify, that based on my findings, NURSING services are [name] medically necessary home health service. HOME BOUND STATUS: I certify that my clinical findings support that this [name] is homebound (i.e., Due to illness or injury, pt requires aid of supportive devices such as crutches, cane, wheelchairs, walkers, the use of special transportation or the assistance of another person to  leave their place of residence. There is [name] normal inability to leave the home and doing so requires considerable and taxing effort. Other absences are for [name] reasons / religious services and are infrequent or of short duration when for other reasons). If current dressing causes regression in wound condition, may D/C ordered dressing product/s and apply Normal Saline Moist Dressing daily until next Wound Healing Center / Other MD appointment. Notify Wound Healing Center of regression in wound condition at [phone]. Please direct any NON-WOUND related issues/requests for orders to [name]'s Primary Care Physician Wound #21 Left,Lateral Foot: Continue Home Health Visits - Please give Deon all the PRN visits that she needs - Also please have PT eval and treat as appropriate Home Health Nurse may visit PRN to address [name] s wound care needs. FACE TO FACE ENCOUNTER: MEDICARE and MEDICAID PATIENTS: I certify that this [name] is under my care and that I had [name] face-to-face encounter that meets the physician face-to-face encounter requirements with this  [name] on this date. The encounter with the [name] was in whole or in part for the following [name] CONDITION: (primary reason for Home Healthcare) [name] NECESSITY: I certify, that based on my findings, NURSING services are [name] medically necessary home health service. HOME BOUND STATUS: I certify that my clinical findings support that this [name] is homebound (i.e., Due to illness or injury, pt requires aid of supportive devices such as crutches, cane, wheelchairs, walkers, the use of special transportation or the assistance of another person to leave their place of residence. There is [name] normal inability to leave the home and doing so requires considerable and taxing effort. Other absences are for [name] reasons / religious services and are infrequent or of short duration when for other reasons). If current dressing causes regression in wound condition, may D/C ordered dressing product/s and apply Normal Saline Moist Dressing daily until next Wound Healing Center / Other MD appointment. Notify Wound Healing Center of regression in wound condition at [phone]. Please direct any NON-WOUND related issues/requests for orders to [name]'s Primary Care Physician Wound #22 Right Ischial Tuberosity: Continue Home Health Visits - Please give Winston all the PRN visits that she needs - Also please have PT eval and treat as appropriate Home Health Nurse may visit PRN to address [name] s wound care needs. FACE TO FACE ENCOUNTER: MEDICARE and MEDICAID PATIENTS: I certify that this [name] is under my care and that I had [name] face-to-face encounter that meets the physician face-to-face encounter requirements with this [name] on this date. The encounter with the [name] was in whole or in part for the following [name] CONDITION: (primary reason for Home Healthcare) [name] NECESSITY: I certify, that based on my findings, NURSING services are [name] medically necessary home health service. HOME BOUND  STATUS: I certify that my clinical findings support that this [name] is homebound (i.e., Due to illness or injury, pt requires aid of supportive devices such as crutches, cane, wheelchairs, walkers, the use of special transportation or the assistance of another person to leave their place of residence. There is [name] normal inability to leave the home and doing so requires considerable and taxing effort. Other absences are for [name] reasons / religious services and are infrequent or of short duration when for other reasons). If current dressing causes regression in wound condition, may D/C ordered dressing product/s and apply Normal Saline Moist Dressing daily until next Wound Healing Center / Other MD appointment. Notify Wound Healing Center of regression in wound condition at  [phone]. GAVYN, ZOSS ([id number]) Please direct any NON-WOUND related issues/requests for orders to [name]'s Primary Care Physician Wound #23 Right,Lateral Amputation Site - Above Knee: Continue Home Health Visits - Please give Zuma all the PRN visits that she needs - Also please have PT eval and treat as appropriate Home Health Nurse may visit PRN to address [name] s wound care needs. FACE TO FACE ENCOUNTER: MEDICARE and MEDICAID PATIENTS: I certify that this [name] is under my care and that I had [name] face-to-face encounter that meets the physician face-to-face encounter requirements with this [name] on this date. The encounter with the [name] was in whole or in part for the following [name] CONDITION: (primary reason for Home Healthcare) [name] NECESSITY: I certify, that based on my findings, NURSING services are [name] medically necessary home health service. HOME BOUND STATUS: I certify that my clinical findings support that this [name] is homebound (i.e., Due to illness or injury, pt requires aid of supportive devices such as crutches, cane, wheelchairs, walkers, the use of special transportation or  the assistance of another person to leave their place of residence. There is [name] normal inability to leave the home and doing so requires considerable and taxing effort. Other absences are for [name] reasons / religious services and are infrequent or of short duration when for other reasons). If current dressing causes regression in wound condition, may D/C ordered dressing product/s and apply Normal Saline Moist Dressing daily until next Wound Healing Center / Other MD appointment. Notify Wound Healing Center of regression in wound condition at [phone]. Please direct any NON-WOUND related issues/requests for orders to [name]'s Primary Care Physician Wound #24 Left,Distal Amputation Site - Transmetatarsal: Continue Home Health Visits - Please give Dalayza all the PRN visits that she needs - Also please have PT eval and treat as appropriate Home Health Nurse may visit PRN to address [name] s wound care needs. FACE TO FACE ENCOUNTER: MEDICARE and MEDICAID PATIENTS: I certify that this [name] is under my care and that I had [name] face-to-face encounter that meets the physician face-to-face encounter requirements with this [name] on this date. The encounter with the [name] was in whole or in part for the following [name] CONDITION: (primary reason for Home Healthcare) [name] NECESSITY: I certify, that based on my findings, NURSING services are [name] medically necessary home health service. HOME BOUND STATUS: I certify that my clinical findings support that this [name] is homebound (i.e., Due to illness or injury, pt requires aid of supportive devices such as crutches, cane, wheelchairs, walkers, the use of special transportation or the assistance of another person to leave their place of residence. There is [name] normal inability to leave the home and doing so requires considerable and taxing effort. Other absences are for [name] reasons / religious services and are infrequent or of short duration  when for other reasons). If current dressing causes regression in wound condition, may D/C ordered dressing product/s and apply Normal Saline Moist Dressing daily until next Wound Healing Center / Other MD appointment. Notify Wound Healing Center of regression in wound condition at [phone]. Please direct any NON-WOUND related issues/requests for orders to [name]'s Primary Care Physician Wound #9 Left Calcaneous: Continue Home Health Visits - Please give Noble all the PRN visits that she needs - Also please have PT eval and treat as appropriate Home Health Nurse may visit PRN to address [name] s wound care needs. FACE TO FACE ENCOUNTER: MEDICARE and MEDICAID PATIENTS: I certify that this [name] is under  my care and that I had [name] face-to-face encounter that meets the physician face-to-face encounter requirements with this [name] on this date. The encounter with the [name] was in whole or in part for the following [name] CONDITION: (primary reason for Home Healthcare) [name] NECESSITY: I certify, that based on my findings, NURSING services are [name] medically necessary home health service. HOME BOUND STATUS: I certify that my clinical findings support that this [name] is homebound (i.e., Due to illness or injury, pt requires aid of supportive devices such as crutches, cane, wheelchairs, walkers, the use of special transportation or the assistance of another person to leave their place of residence. There is [name] normal inability to leave the home and doing so requires considerable and taxing effort. Other absences are for [name] reasons / religious services and are infrequent or of short duration when for other reasons). TONIQUA, MELAMED ([id number]) If current dressing causes regression in wound condition, may D/C ordered dressing product/s and apply Normal Saline Moist Dressing daily until next Wound Healing Center / Other MD appointment. Notify Wound Healing Center of regression in  wound condition at [phone]. Please direct any NON-WOUND related issues/requests for orders to [name]'s Primary Care Physician o #1 I think this [name]'s erythema which I thought was cellulitis by picture is actually [name] deeper soft tissue infection or possibly myositis. Subcutaneous crepitus Suggests anaerobic infection, staph, group [name] strep or some combination. Although there was some resistance to the notion where he able to get her transferred to [facility] With the assistance of [name] [name] The [name] does not want to go to [facility]. I think she is going to need IV antibiotics imaging of the right thigh probably including an MRI #2 the [name] has [name] new wound in the middle aspect of her left transmetatarsal amputation site this indeed is worrisome and I think the foot itself is going to need to be imaged hopefully while she is there #3 although the [name]'s vital signs were stable she did not look systemically well today. I'm therefore glad she eventually agreed to hospitalization. #4 I spoke to [name] [name] on the phone who was her usual is extremely helpful in dealing with his vast [name] population Electronic Signature(s) Signed: 11/18/2016 4:59:22 PM By: [name] Najjar MD Entered By: [name] [name] on 11/18/2016 12:56:53 [name] [name] ([id number]) -------------------------------------------------------------------------------- SuperBill Details [name] [name] Date of Service: 11/18/2016 [name] Name: [name] [name] Account Number: [id number] [name] Record Treating RN: [name] [name] [id number] Number: Other Clinician: 08/15/1981 (36 y.o. 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, right lower extremity S71.101A Unspecified open wound, right thigh, initial encounter L89.322 Pressure ulcer of left buttock, stage 2 L89.899 Pressure ulcer of other site, unspecified stage L89.223 Pressure ulcer of left hip, stage 3 Facility Procedures CPT4 Code: [id number] Description: 93716 - WOUND CARE VISIT-LEV 5 EST PT Modifier: Quantity: 1 Physician Procedures CPT4 Code: [id number] Description: 99214 - WC PHYS LEVEL 4 - EST PT ICD-10 Description Diagnosis T87.53 Necrosis of  amputation stump, right lower extremit L89.899 Pressure ulcer of other site, unspecified stage Modifier: y Quantity: 1 Electronic Signature(s) Signed: 11/18/2016 2:00:34 PM By: [name] [name] Signed: 11/18/2016 4:59:22 PM By: [name] Najjar MD Entered By: [name] [name] on 11/18/2016 14:00:33

## 2016-11-25 ENCOUNTER — Ambulatory Visit: Payer: Medicare Other | Admitting: Internal Medicine

## 2016-12-27 DEATH — deceased

## 2017-07-16 IMAGING — CT CT HIP*R* W/O CM
2 of 4 series · 17 of 46 positions shown, 19 images · non-contrast
Comparison: None.

CLINICAL DATA: Status post fall.  Landed on the right hip.

EXAM:
CT OF THE RIGHT HIP WITHOUT CONTRAST
TECHNIQUE: Multidetector CT imaging of the right hip was performed according to
the standard protocol. Multiplanar CT image reconstructions were
also generated.

[Series 2: pelvis/hip · axial · 0.39mm/px · z∈[-1099,-908]mm · 14 of 346 slices shown, 16 images]
[im 14/346  soft-tissue]
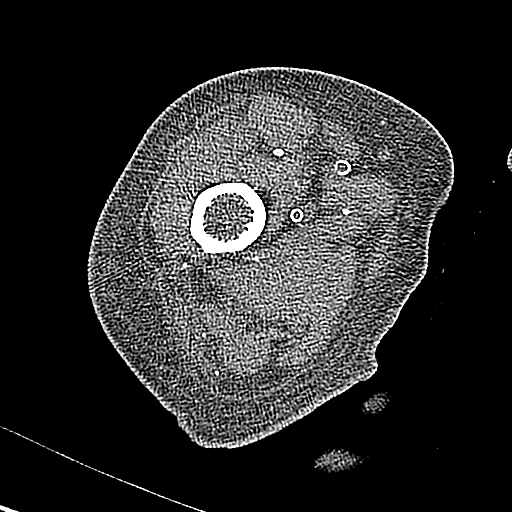
[im 14/346  bone]
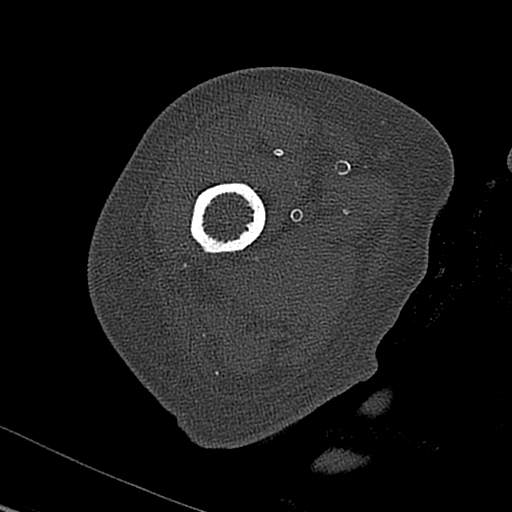
[im 42/346  soft-tissue]
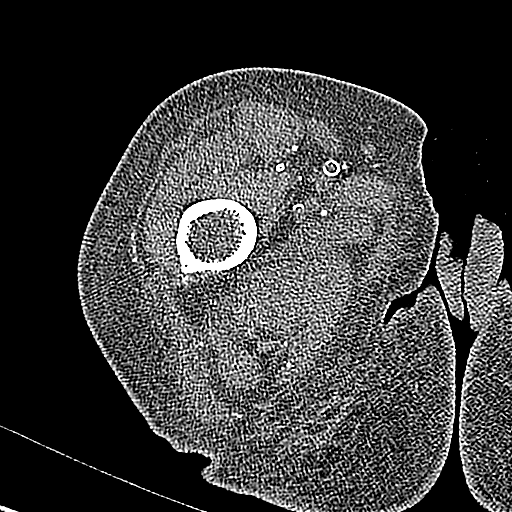
[im 70/346  soft-tissue]
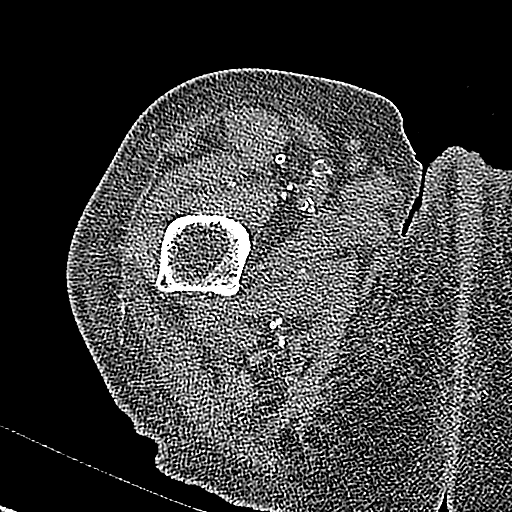
[im 97/346  soft-tissue]
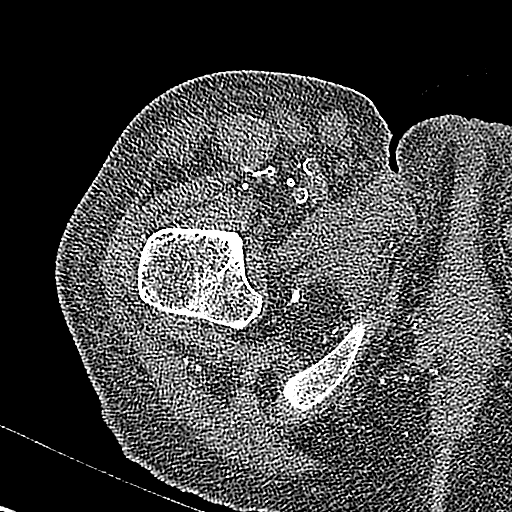
[im 111/346  soft-tissue]
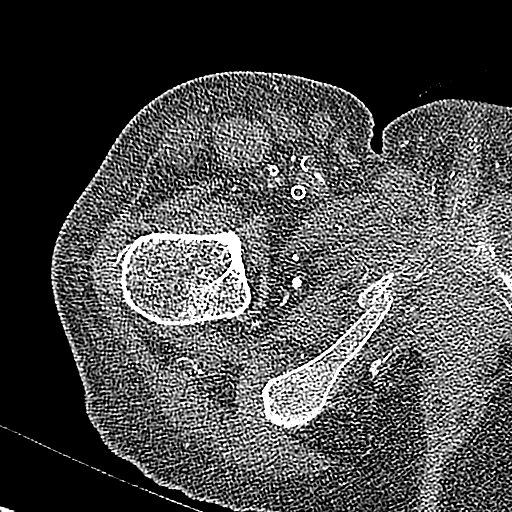
[im 139/346  soft-tissue]
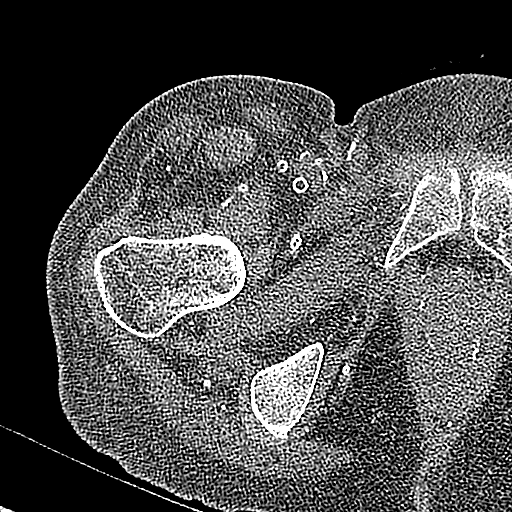
[im 166/346  soft-tissue]
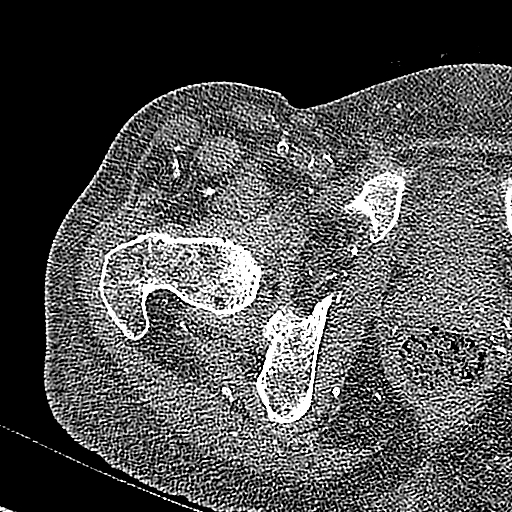
[im 180/346  soft-tissue]
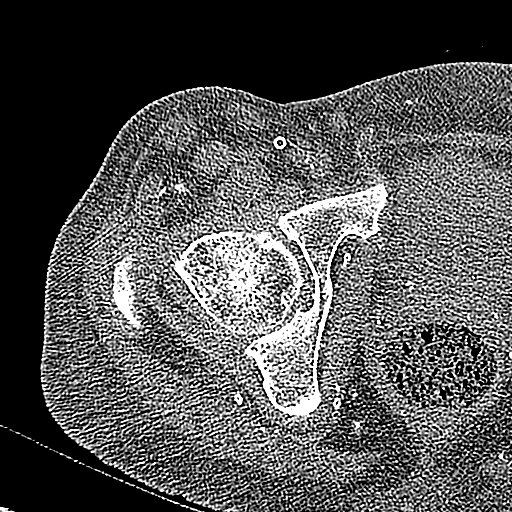
[im 208/346  soft-tissue]
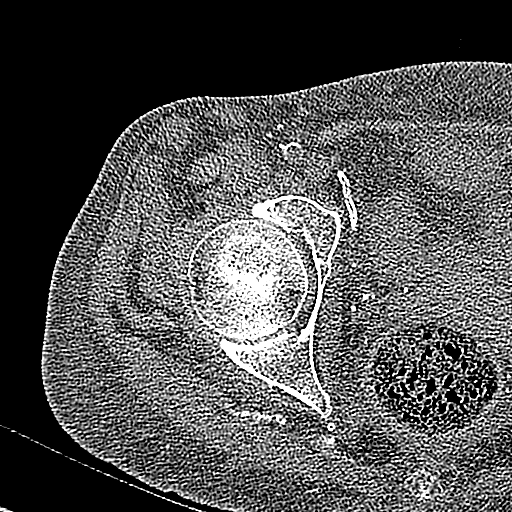
[im 208/346  bone]
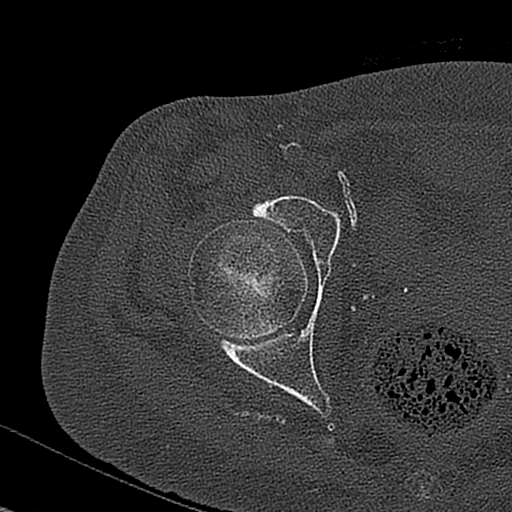
[im 235/346  soft-tissue]
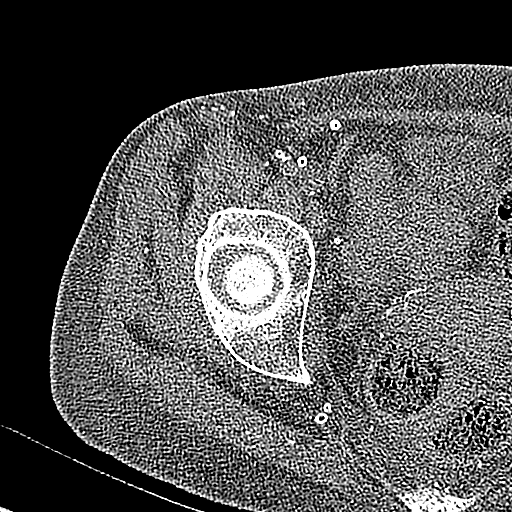
[im 263/346  soft-tissue]
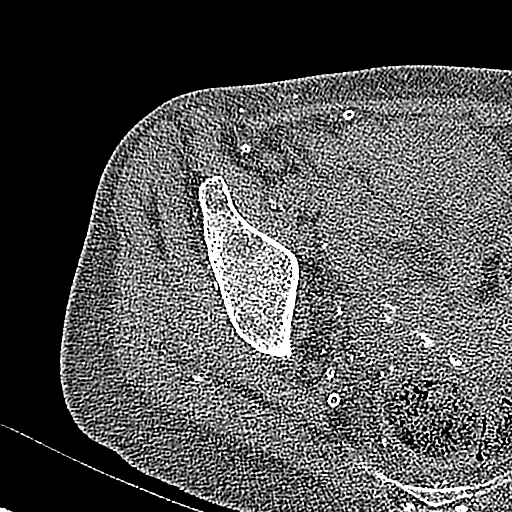
[im 277/346  soft-tissue]
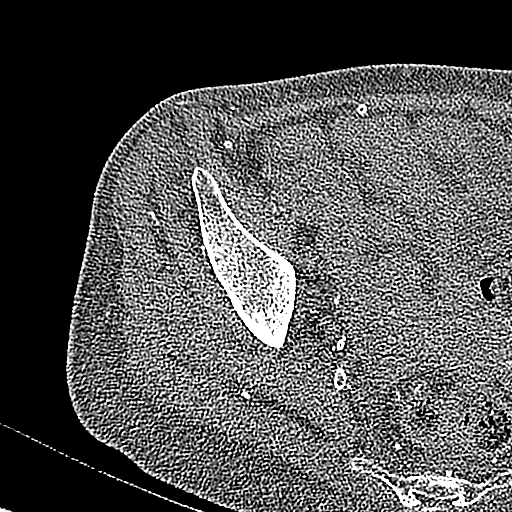
[im 304/346  soft-tissue]
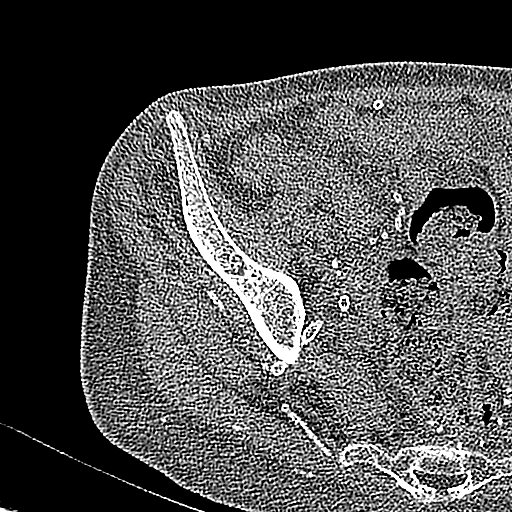
[im 332/346  soft-tissue]
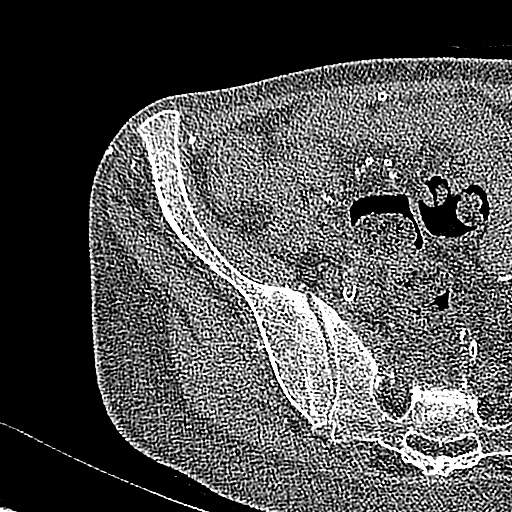

[Series 7: coronal st · coronal · 0.36mm/px · 3 of 71 slices shown]
[im 24/71  soft-tissue]
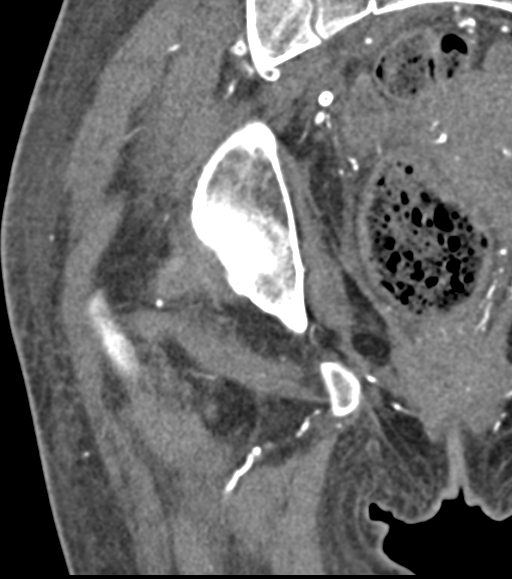
[im 32/71  soft-tissue]
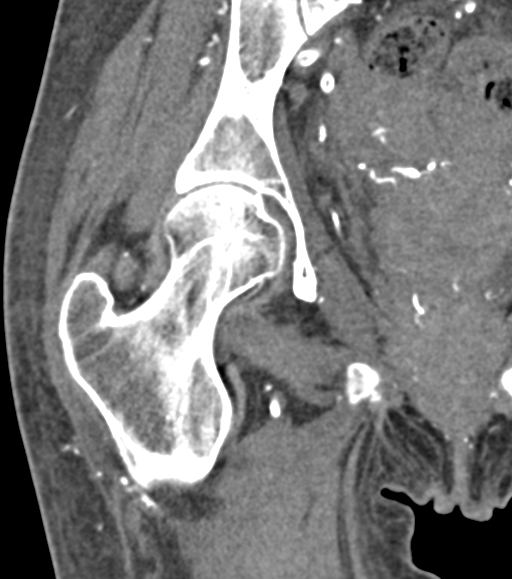
[im 39/71  soft-tissue]
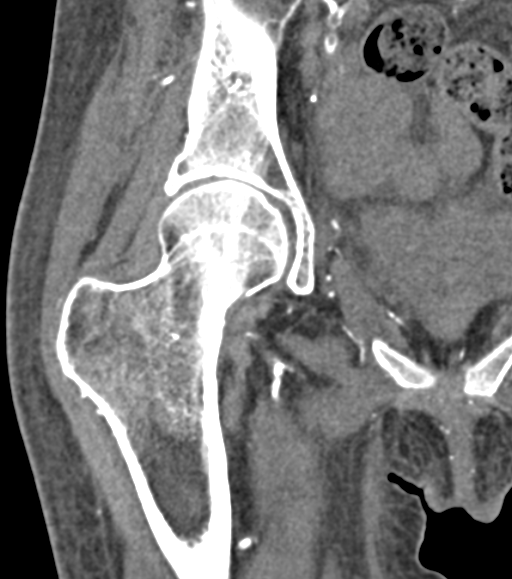

[17 of 46 positions shown; findings below may reference images not displayed]

FINDINGS: There is no right hip fracture or dislocation. There is a mildly
displaced right inferior pubic ramus fracture. There is a
nondisplaced fracture of the right superior pubic ramus. There is a
nondisplaced left inferior pubic ramus fracture extending to the
pubic body.

There is a partially visualize, mildly comminuted right anterior
sacral alar fracture. The sacroiliac joint is congruent.

No aggressive lytic or sclerotic osseous lesion.

There is no fluid collection or hematoma. There is extensive
peripheral vascular atherosclerotic disease.
IMPRESSION: 1. Mildly displaced right inferior pubic ramus fracture.
Nondisplaced fracture of the right superior pubic ramus.
2. Nondisplaced left inferior pubic ramus fracture extending into
the pubic body.
3. Mildly comminuted right anterior sacral alar fracture.
